# Patient Record
Sex: Male | Born: 1935
Health system: Southern US, Community
[De-identification: ages and names within clinical notes are randomized; demographics above are authoritative.]

## PROBLEM LIST (undated history)

## (undated) DIAGNOSIS — J439 Emphysema, unspecified: Secondary | ICD-10-CM

## (undated) DIAGNOSIS — L409 Psoriasis, unspecified: Secondary | ICD-10-CM

## (undated) DIAGNOSIS — K219 Gastro-esophageal reflux disease without esophagitis: Secondary | ICD-10-CM

## (undated) DIAGNOSIS — I2510 Atherosclerotic heart disease of native coronary artery without angina pectoris: Secondary | ICD-10-CM

## (undated) DIAGNOSIS — I10 Essential (primary) hypertension: Secondary | ICD-10-CM

## (undated) DIAGNOSIS — I739 Peripheral vascular disease, unspecified: Secondary | ICD-10-CM

## (undated) DIAGNOSIS — E039 Hypothyroidism, unspecified: Secondary | ICD-10-CM

## (undated) DIAGNOSIS — G4733 Obstructive sleep apnea (adult) (pediatric): Secondary | ICD-10-CM

## (undated) DIAGNOSIS — I255 Ischemic cardiomyopathy: Secondary | ICD-10-CM

## (undated) DIAGNOSIS — I639 Cerebral infarction, unspecified: Secondary | ICD-10-CM

## (undated) DIAGNOSIS — D509 Iron deficiency anemia, unspecified: Secondary | ICD-10-CM

## (undated) DIAGNOSIS — J342 Deviated nasal septum: Secondary | ICD-10-CM

## (undated) DIAGNOSIS — E119 Type 2 diabetes mellitus without complications: Secondary | ICD-10-CM

## (undated) DIAGNOSIS — N400 Enlarged prostate without lower urinary tract symptoms: Secondary | ICD-10-CM

## (undated) DIAGNOSIS — I4891 Unspecified atrial fibrillation: Secondary | ICD-10-CM

## (undated) DIAGNOSIS — N200 Calculus of kidney: Secondary | ICD-10-CM

## (undated) DIAGNOSIS — E785 Hyperlipidemia, unspecified: Secondary | ICD-10-CM

## (undated) DIAGNOSIS — R0600 Dyspnea, unspecified: Secondary | ICD-10-CM

## (undated) DIAGNOSIS — I480 Paroxysmal atrial fibrillation: Secondary | ICD-10-CM

## (undated) DIAGNOSIS — I420 Dilated cardiomyopathy: Secondary | ICD-10-CM

## (undated) DIAGNOSIS — I5020 Unspecified systolic (congestive) heart failure: Secondary | ICD-10-CM

## (undated) DIAGNOSIS — J189 Pneumonia, unspecified organism: Secondary | ICD-10-CM

## (undated) DIAGNOSIS — I219 Acute myocardial infarction, unspecified: Secondary | ICD-10-CM

## (undated) HISTORY — DX: Emphysema, unspecified: J43.9

## (undated) HISTORY — DX: Unspecified systolic (congestive) heart failure: I50.20

## (undated) HISTORY — PX: CORONARY ARTERY BYPASS GRAFT: SHX141

## (undated) HISTORY — DX: Psoriasis, unspecified: L40.9

## (undated) HISTORY — DX: Iron deficiency anemia, unspecified: D50.9

## (undated) HISTORY — DX: Gastro-esophageal reflux disease without esophagitis: K21.9

## (undated) HISTORY — DX: Benign prostatic hyperplasia without lower urinary tract symptoms: N40.0

## (undated) HISTORY — PX: OTHER SURGICAL HISTORY: SHX169

## (undated) HISTORY — DX: Dilated cardiomyopathy: I25.5

## (undated) HISTORY — DX: Cerebral infarction, unspecified: I63.9

## (undated) HISTORY — DX: Unspecified atrial fibrillation: I48.91

## (undated) HISTORY — DX: Dilated cardiomyopathy: I42.0

## (undated) HISTORY — DX: Paroxysmal atrial fibrillation: I48.0

## (undated) HISTORY — DX: Acute myocardial infarction, unspecified: I21.9

## (undated) HISTORY — PX: CATARACT EXTRACTION: SUR2

## (undated) HISTORY — DX: Obstructive sleep apnea (adult) (pediatric): G47.33

## (undated) HISTORY — DX: Atherosclerotic heart disease of native coronary artery without angina pectoris: I25.10

## (undated) HISTORY — PX: BI-VENTRICULAR IMPLANTABLE CARDIOVERTER DEFIBRILLATOR  (CRT-D): SHX5747

## (undated) HISTORY — PX: CARPAL TUNNEL RELEASE: SHX101

## (undated) HISTORY — DX: Type 2 diabetes mellitus without complications: E11.9

## (undated) HISTORY — DX: Hyperlipidemia, unspecified: E78.5

## (undated) HISTORY — DX: Dyspnea, unspecified: R06.00

## (undated) HISTORY — DX: Calculus of kidney: N20.0

## (undated) HISTORY — DX: Essential (primary) hypertension: I10

## (undated) HISTORY — DX: Pneumonia, unspecified organism: J18.9

## (undated) HISTORY — DX: Hypothyroidism, unspecified: E03.9

## (undated) HISTORY — DX: Peripheral vascular disease, unspecified: I73.9

## (undated) HISTORY — DX: Deviated nasal septum: J34.2

---

## 1998-12-03 ENCOUNTER — Inpatient Hospital Stay (HOSPITAL_COMMUNITY): Admission: EM | Admit: 1998-12-03 | Discharge: 1998-12-10 | Payer: Self-pay | Admitting: Emergency Medicine

## 1998-12-03 ENCOUNTER — Encounter: Payer: Self-pay | Admitting: Emergency Medicine

## 1998-12-03 ENCOUNTER — Encounter: Payer: Self-pay | Admitting: Thoracic Surgery (Cardiothoracic Vascular Surgery)

## 1998-12-04 ENCOUNTER — Encounter: Payer: Self-pay | Admitting: Thoracic Surgery (Cardiothoracic Vascular Surgery)

## 1998-12-05 ENCOUNTER — Encounter: Payer: Self-pay | Admitting: Thoracic Surgery (Cardiothoracic Vascular Surgery)

## 1998-12-09 ENCOUNTER — Encounter: Payer: Self-pay | Admitting: Thoracic Surgery (Cardiothoracic Vascular Surgery)

## 1999-01-20 ENCOUNTER — Encounter (HOSPITAL_COMMUNITY): Admission: RE | Admit: 1999-01-20 | Discharge: 1999-04-20 | Payer: Self-pay | Admitting: *Deleted

## 1999-11-17 ENCOUNTER — Encounter: Admission: RE | Admit: 1999-11-17 | Discharge: 2000-02-15 | Payer: Self-pay | Admitting: *Deleted

## 2002-09-10 ENCOUNTER — Encounter (HOSPITAL_BASED_OUTPATIENT_CLINIC_OR_DEPARTMENT_OTHER): Admission: RE | Admit: 2002-09-10 | Discharge: 2002-12-09 | Payer: Self-pay | Admitting: Internal Medicine

## 2002-12-06 ENCOUNTER — Encounter (HOSPITAL_BASED_OUTPATIENT_CLINIC_OR_DEPARTMENT_OTHER): Admission: RE | Admit: 2002-12-06 | Discharge: 2003-03-06 | Payer: Self-pay | Admitting: Internal Medicine

## 2003-06-13 ENCOUNTER — Encounter (HOSPITAL_BASED_OUTPATIENT_CLINIC_OR_DEPARTMENT_OTHER): Admission: RE | Admit: 2003-06-13 | Discharge: 2003-07-09 | Payer: Self-pay | Admitting: Internal Medicine

## 2003-10-03 ENCOUNTER — Encounter (HOSPITAL_BASED_OUTPATIENT_CLINIC_OR_DEPARTMENT_OTHER): Admission: RE | Admit: 2003-10-03 | Discharge: 2003-10-25 | Payer: Self-pay | Admitting: Internal Medicine

## 2004-01-09 ENCOUNTER — Encounter (HOSPITAL_BASED_OUTPATIENT_CLINIC_OR_DEPARTMENT_OTHER): Admission: RE | Admit: 2004-01-09 | Discharge: 2004-02-06 | Payer: Self-pay | Admitting: Internal Medicine

## 2004-04-30 ENCOUNTER — Encounter (HOSPITAL_BASED_OUTPATIENT_CLINIC_OR_DEPARTMENT_OTHER): Admission: RE | Admit: 2004-04-30 | Discharge: 2004-05-15 | Payer: Self-pay | Admitting: Internal Medicine

## 2004-05-20 ENCOUNTER — Emergency Department (HOSPITAL_COMMUNITY): Admission: EM | Admit: 2004-05-20 | Discharge: 2004-05-20 | Payer: Self-pay | Admitting: Emergency Medicine

## 2004-09-10 ENCOUNTER — Encounter (HOSPITAL_BASED_OUTPATIENT_CLINIC_OR_DEPARTMENT_OTHER): Admission: RE | Admit: 2004-09-10 | Discharge: 2004-09-23 | Payer: Self-pay | Admitting: Internal Medicine

## 2004-12-14 ENCOUNTER — Encounter (HOSPITAL_BASED_OUTPATIENT_CLINIC_OR_DEPARTMENT_OTHER): Admission: RE | Admit: 2004-12-14 | Discharge: 2004-12-30 | Payer: Self-pay | Admitting: Internal Medicine

## 2006-06-13 ENCOUNTER — Ambulatory Visit (HOSPITAL_COMMUNITY): Admission: RE | Admit: 2006-06-13 | Discharge: 2006-06-13 | Payer: Self-pay | Admitting: Cardiology

## 2006-07-13 ENCOUNTER — Ambulatory Visit: Payer: Self-pay | Admitting: Pulmonary Disease

## 2006-08-15 ENCOUNTER — Ambulatory Visit (HOSPITAL_BASED_OUTPATIENT_CLINIC_OR_DEPARTMENT_OTHER): Admission: RE | Admit: 2006-08-15 | Discharge: 2006-08-15 | Payer: Self-pay | Admitting: Pulmonary Disease

## 2006-08-15 ENCOUNTER — Ambulatory Visit: Payer: Self-pay | Admitting: Pulmonary Disease

## 2006-08-31 ENCOUNTER — Ambulatory Visit: Payer: Self-pay | Admitting: Pulmonary Disease

## 2006-11-07 ENCOUNTER — Ambulatory Visit: Payer: Self-pay | Admitting: Pulmonary Disease

## 2007-08-27 ENCOUNTER — Inpatient Hospital Stay (HOSPITAL_COMMUNITY): Admission: EM | Admit: 2007-08-27 | Discharge: 2007-08-30 | Payer: Self-pay | Admitting: Emergency Medicine

## 2007-10-26 DIAGNOSIS — I5020 Unspecified systolic (congestive) heart failure: Secondary | ICD-10-CM

## 2007-10-26 HISTORY — DX: Unspecified systolic (congestive) heart failure: I50.20

## 2008-09-06 ENCOUNTER — Inpatient Hospital Stay (HOSPITAL_BASED_OUTPATIENT_CLINIC_OR_DEPARTMENT_OTHER): Admission: RE | Admit: 2008-09-06 | Discharge: 2008-09-06 | Payer: Self-pay | Admitting: Cardiology

## 2008-09-17 ENCOUNTER — Encounter: Admission: RE | Admit: 2008-09-17 | Discharge: 2008-09-17 | Payer: Self-pay | Admitting: Cardiology

## 2008-09-24 ENCOUNTER — Encounter: Admission: RE | Admit: 2008-09-24 | Discharge: 2008-09-24 | Payer: Self-pay | Admitting: Cardiology

## 2008-10-08 ENCOUNTER — Inpatient Hospital Stay (HOSPITAL_COMMUNITY): Admission: AD | Admit: 2008-10-08 | Discharge: 2008-10-09 | Payer: Self-pay | Admitting: Cardiology

## 2008-10-11 ENCOUNTER — Encounter: Admission: RE | Admit: 2008-10-11 | Discharge: 2008-10-11 | Payer: Self-pay | Admitting: Cardiology

## 2008-11-06 ENCOUNTER — Ambulatory Visit (HOSPITAL_COMMUNITY): Admission: RE | Admit: 2008-11-06 | Discharge: 2008-11-06 | Payer: Self-pay | Admitting: Cardiology

## 2008-11-28 ENCOUNTER — Ambulatory Visit: Payer: Self-pay | Admitting: Pulmonary Disease

## 2008-11-28 ENCOUNTER — Inpatient Hospital Stay (HOSPITAL_COMMUNITY): Admission: EM | Admit: 2008-11-28 | Discharge: 2008-12-02 | Payer: Self-pay | Admitting: *Deleted

## 2010-03-05 ENCOUNTER — Ambulatory Visit (HOSPITAL_COMMUNITY): Admission: RE | Admit: 2010-03-05 | Discharge: 2010-03-05 | Payer: Self-pay | Admitting: Cardiology

## 2010-03-05 ENCOUNTER — Encounter: Payer: Self-pay | Admitting: Pulmonary Disease

## 2010-03-31 DIAGNOSIS — E7800 Pure hypercholesterolemia, unspecified: Secondary | ICD-10-CM | POA: Insufficient documentation

## 2010-03-31 DIAGNOSIS — Z95810 Presence of automatic (implantable) cardiac defibrillator: Secondary | ICD-10-CM | POA: Insufficient documentation

## 2010-03-31 DIAGNOSIS — I10 Essential (primary) hypertension: Secondary | ICD-10-CM | POA: Insufficient documentation

## 2010-03-31 DIAGNOSIS — K219 Gastro-esophageal reflux disease without esophagitis: Secondary | ICD-10-CM | POA: Insufficient documentation

## 2010-04-01 ENCOUNTER — Ambulatory Visit: Payer: Self-pay | Admitting: Pulmonary Disease

## 2010-04-01 DIAGNOSIS — E785 Hyperlipidemia, unspecified: Secondary | ICD-10-CM | POA: Insufficient documentation

## 2010-04-01 DIAGNOSIS — E119 Type 2 diabetes mellitus without complications: Secondary | ICD-10-CM | POA: Insufficient documentation

## 2010-04-01 DIAGNOSIS — I10 Essential (primary) hypertension: Secondary | ICD-10-CM | POA: Insufficient documentation

## 2010-04-01 DIAGNOSIS — J439 Emphysema, unspecified: Secondary | ICD-10-CM

## 2010-04-01 HISTORY — DX: Emphysema, unspecified: J43.9

## 2010-04-03 ENCOUNTER — Ambulatory Visit (HOSPITAL_COMMUNITY): Admission: RE | Admit: 2010-04-03 | Discharge: 2010-04-03 | Payer: Self-pay | Admitting: Cardiology

## 2010-04-16 ENCOUNTER — Encounter: Payer: Self-pay | Admitting: Pulmonary Disease

## 2010-04-22 ENCOUNTER — Inpatient Hospital Stay (HOSPITAL_COMMUNITY): Admission: AD | Admit: 2010-04-22 | Discharge: 2010-04-23 | Payer: Self-pay | Admitting: Cardiology

## 2010-04-29 ENCOUNTER — Ambulatory Visit: Payer: Self-pay | Admitting: Pulmonary Disease

## 2010-08-07 ENCOUNTER — Ambulatory Visit (HOSPITAL_COMMUNITY): Admission: RE | Admit: 2010-08-07 | Discharge: 2010-08-07 | Payer: Self-pay | Admitting: Cardiology

## 2010-09-02 ENCOUNTER — Encounter: Payer: Self-pay | Admitting: Pulmonary Disease

## 2010-09-09 ENCOUNTER — Ambulatory Visit: Payer: Self-pay | Admitting: Pulmonary Disease

## 2010-11-24 NOTE — Letter (Signed)
Summary: [facility]  [facility]   Imported By: Lester Dyess 04/22/2010 09:39:36  _____________________________________________________________________  External Attachment:    Type:   Image     Comment:   External Document

## 2010-11-24 NOTE — Assessment & Plan Note (Signed)
Summary: SOB LAST SEEN IN 2008/CB   Visit Type:  Initial Consult Copy to:  Dr. Armanda Magic Primary Provider/Referring Provider:  Dr. Kirby Funk  CC:  Pt c/o SOB with exertion and at rest. .  History of Present Illness: 77 yo male for evaluation of dyspnea.  I previously met [name] in 2007 for his sleep apnea.  I then saw him in the hospital in 2010 for acute bronchospasm.  He has an extensive history of tobacco abuse, and has been told at different times that he has asthmatic bronchitis and/or COPD.  He has noticed problems with his breathing for some time.  This became acutely worse in February.  He was told that he had pneumonia then.  He had a recurrence in March.  He was treated each time with prednisone and antibiotics with improvement in his symptoms.  He used to do yard work, but has trouble with this now.  He can only walk about 50 feet before getting out of breath.  He has occasional cough which is usually dry, but sometimes he will bring up clear to green sputum.  He denies hemoptysis.  He has noticed wheezing, but feels that much of this may be coming from his throat.  He denies any history of allergies or hay fever.  His denies problems with his swallowing.  His sinuses are okay, and he denies post-nasal drip.  He was started on proair for possible asthma and/or COPD.  He has not found this to be of much benefit.  He does not recall ever using any other inhalers as an outpatient.  He did use a nebulizer when in the hospital, and felt like this helped.  He has an extensive cardiac history with CAD s/p MI, systolic heart failure, and hypertension.  Of note is that he is on ramipril, and carvedilol for his cardiovascular disease.  He has atrial fibrillation, and is on chronic coumadin for this.  He is scheduled to have cardioversion later this month.  He quit smoking in 2000.  He denies occupational exposures.  There is no travel history or animal exposures.  He has been treated  for pneumonia several times in the past few years, but denies any history of TB.  Pulmonary function tests from January 03, 2010: No obstruction, lung volumes were normal, mild diffusion defect.He has been using his CPAP.  He has a full face mask.  He has not noticed any problems with the machine.  His wife does not hear him snore anymore.  He feels like the pressure he is getting is as much as what it was before, and he has noticed more symptoms of daytime sleepiness compared to before.   Preventive Screening-Counseling & Management  Alcohol-Tobacco     Smoking Status: quit     Packs/Day: 3.0     Year Started: 1947     Year Quit: 2000  Current Medications (verified): 1)  Omeprazole 20 Mg Cpdr (Omeprazole) .[name].. 1 Once Daily 2)  Levothroid 50 Mcg Tabs (Levothyroxine Sodium) .[name].. 1 Once Daily 3)  Metformin Hcl 1000 Mg Tabs (Metformin Hcl) .[name].. 1 Two Times A Day With Meals 4)  Lantus Solostar 100 Unit/ml Soln (Insulin Glargine) .... 70 Units At Bedtime 5)  Novolog Flexpen 100 Unit/ml Soln (Insulin Aspart) .... 22 Units Before Breakfast and Dinner and 11 Units Before Lunch 6)  Caltrate 600+d 600-400 Mg-Unit Tabs (Calcium Carbonate-Vitamin D) .[name].. 1 With Meals Two Times A Day 7)  Multivitamins  Tabs (Multiple Vitamin) .Marland KitchenMarland KitchenMarland Kitchen  1 Once Daily 8)  Lipitor 80 Mg Tabs (Atorvastatin Calcium) .[name].. 1 Once Daily 9)  Aspirin 81 Mg Tbec (Aspirin) .[name].. 1 Once Daily 10)  Coumadin 5 Mg Tabs (Warfarin Sodium) .... As Directed Per Clinic 11)  Carvedilol 12.5 Mg Tabs (Carvedilol) .[name].. 1 Two Times A Day 12)  Ramipril 5 Mg Caps (Ramipril) .[name].. 1 Once Daily 13)  Furosemide 40 Mg Tabs (Furosemide) .... Take 2 Tablet By Mouth Two Times A Day 14)  Spironolactone 25 Mg Tabs (Spironolactone) .... Take 1/2 Tablet By Mouth Once A Day 15)  Proair Hfa 108 (90 Base) Mcg/act Aers (Albuterol Sulfate) .... As Needed  Allergies (verified): No Known Drug Allergies  Past History:  Past Medical History: Dyspnea      - PFT  03/05/10 FEV1 2.44(87%), FEV1%74, DLCO 68% Obstructive sleep apnea      - PSG 06/14/06 AHI 35.5      - CPAP 9 cm H2O Coronary artery disease Hypertension Diabetes mellitus, type II Hyperlipidemia Systolic congestive heart failure with EF 40% Atrial fibrillation Hypothyroidism Pneumonia 2008, 2010, 2011 Cleft palate Nasal septal deviation Iron deficiency anemia GERD Benign prostatic hypertrophy Nephrolithiasis Psoriasis Seborrheic keratosis    Past Surgical History: Carpal Tunnel surgery CABG, RCA too small to bypass Feb 2000 Left cleft palate and left cleft lip repair as a child Cataract surgery C-Spine surgery ICD/BI-V insertion 10/08/08  Family History: Reviewed history from 03/31/2010 and no changes required. Asthma- Father Heart dz- Brother CVA- Father  Social History: Former smoker.  Started at age 89, smoke 2 to 3 packs per day, and quit in 2000. Quit ETOH in 2000 Married Retired from [employer] ([employer]). Smoking Status:  quit Packs/Day:  3.0  Review of Systems       The patient complains of shortness of breath with activity, shortness of breath at rest, productive cough, irregular heartbeats, tooth/dental problems, and hand/feet swelling.  The patient denies non-productive cough, coughing up blood, chest pain, acid heartburn, indigestion, loss of appetite, weight change, abdominal pain, difficulty swallowing, sore throat, headaches, nasal congestion/difficulty breathing through nose, sneezing, itching, ear ache, anxiety, depression, joint stiffness or pain, rash, change in color of mucus, and fever.    Vital Signs:  Patient profile:   77 year old male Height:      69 inches Weight:      232 pounds BMI:     34.38 O2 Sat:      95 % on Room air Temp:     97.7 degrees F oral Pulse rate:   80 / minute BP sitting:   82 / 60  (right arm) Cuff size:   regular  Vitals Entered By: Zackery Barefoot CMA (April 01, 2010 3:18 PM)  O2 Flow:  Room air CC: Pt c/o SOB  with exertion and at rest.  Comments Medications reviewed with patient Verified contact number and pharmacy with patient Zackery Barefoot CMA  April 01, 2010 3:18 PM    Physical Exam  General:  obese.   Eyes:  PERRLA and EOMI Nose:  clear drainage, no tenderness Mouth:  s/p cleft lip/palate repair, no exudate Neck:  no JVD.   Chest Wall:  no deformities noted Lungs:  diminished breath sounds, no wheezing or rales Heart:  regular rhythm, normal rate, and no murmurs.   Abdomen:  obese, soft, nontender, normal bowel sounds Msk:  normal gait.   Pulses:  pulses normal Extremities:  minimal ankle edema Neurologic:  normal CN II-XII and strength normal.   Cervical  Nodes:  no significant adenopathy Psych:  alert and cooperative; normal mood and affect; normal attention span and concentration   Impression & Recommendations:  Problem # 1:  DYSPNEA (ICD-786.05) This is likely multifactorial.    He has coronary artery disease, hypertension, systolic dysfunction, and atrial fibrillation.  He is scheduled to undergo cardioversion soon for his atrial fibrillation.  Hopefully restoring normal sinus rhythm will improve his cardiac function and alleviate some of his dyspnea.  These issues are being addressed by cardiology.  Of note is that he is on an ACE inhibitor, and this could be contributing to some of his upper airway symptoms.  Will defer to cardiology whether he can have this changed to an alternative agent.  He has an extensive history of prior tobacco abuse, but his recent pulmonary function test did not show evidence for airflow obstruction.  He has been told previously that he has either asthmatic bronchitis or COPD.  He has previously been on nebulizer therapy when he has acute breathing problems, and felt like this helped better that inhaler therapy.  I will set him up with an albuterol nebulizer.  Depending on his response to nebulizer therapy will decide if he needs to have further  adjustements to his bronchodilator regimen.  Of note is that he is on carvedilol.  It is possible that this could be contributing to some degree of bronchospasm.  It may be warranted to change to a more cardioselective beta blocking agent.  Will defer this decision to cardiology.  He has been on chronic coumadin for his atrial fibrillation, and there is nothing in his history to suggest chronic thrombo-embolic disease.  I will defer further evaluation for this at present.  He leads a relatively sedintary lifestyle, and he likely has a component of deconditioning.  Depending on the outcome of his evaluation, he may need to consider starting a graded exercise regimen.  If the cause of his dyspnea still can not be determined, then consideration may need to be given to him undergoing a cardio-pulmonary stress test (CPST).  Problem # 2:  OBSTRUCTIVE SLEEP APNEA (ICD-327.23) He has a history of severe sleep apnea.  He has been on CPAP 9 cm H2O.  I will get a copy of his CPAP download to determine if any adjustments are needed for his CPAP set up.  Problem # 3:  ABNORMAL CHEST XRAY (ICD-793.1) He had pneumonia in April 2011.  He has not had f/u chest xray since.  Will repeat his chest xray today to document clearing of his infiltrate, and to further assess whether he has any other lung parenchymal disorder that could be contributing to his dyspnea.  Medications Added to Medication List This Visit: 1)  Spironolactone 25 Mg Tabs (Spironolactone) .... Take 1/2 tablet by mouth once a day 2)  Furosemide 40 Mg Tabs (Furosemide) .... Take 2 tablet by mouth two times a day 3)  Proair Hfa 108 (90 Base) Mcg/act Aers (Albuterol sulfate) .... As needed 4)  Albuterol Sulfate (2.5 Mg/56ml) 0.083% Nebu (Albuterol sulfate) .... One vial nebulized four times per day  Complete Medication List: 1)  Carvedilol 12.5 Mg Tabs (Carvedilol) .[name].. 1 two times a day 2)  Ramipril 5 Mg Caps (Ramipril) .[name].. 1 once daily 3)   Spironolactone 25 Mg Tabs (Spironolactone) .... Take 1/2 tablet by mouth once a day 4)  Furosemide 40 Mg Tabs (Furosemide) .... Take 2 tablet by mouth two times a day 5)  Aspirin 81 Mg Tbec (Aspirin) .Marland KitchenMarland KitchenMarland Kitchen  1 once daily 6)  Coumadin 5 Mg Tabs (Warfarin sodium) .... As directed per clinic 7)  Lipitor 80 Mg Tabs (Atorvastatin calcium) .[name].. 1 once daily 8)  Omeprazole 20 Mg Cpdr (Omeprazole) .[name].. 1 once daily 9)  Levothroid 50 Mcg Tabs (Levothyroxine sodium) .[name].. 1 once daily 10)  Metformin Hcl 1000 Mg Tabs (Metformin hcl) .[name].. 1 two times a day with meals 11)  Lantus Solostar 100 Unit/ml Soln (Insulin glargine) .... 70 units at bedtime 12)  Novolog Flexpen 100 Unit/ml Soln (Insulin aspart) .... 22 units before breakfast and dinner and 11 units before lunch 13)  Caltrate 600+d 600-400 Mg-unit Tabs (Calcium carbonate-vitamin d) .Marland KitchenMarland Kitchen. 1 with meals two times a day 14)  Multivitamins Tabs (Multiple vitamin) .[name].. 1 once daily 15)  Proair Hfa 108 (90 Base) Mcg/act Aers (Albuterol sulfate) .... As needed 16)  Albuterol Sulfate (2.5 Mg/58ml) 0.083% Nebu (Albuterol sulfate) .... One vial nebulized four times per day  Other Orders: Consultation Level IV (16109) Prescription Created Electronically [phone]) DME Referral (DME) DME Referral (DME) T-2 View CXR ([id number])  Patient Instructions: 1)  Chest xray today 2)  Will get report from CPAP machine 3)  Will arrange for home nebulizer 4)  Use albuterol one vial nebulized up to four times per day as needed for cough, wheeze, shortness of breath or chest congestion  5)  Use proair two puffs as needed for for cough, wheeze, shortness of breath or chest congestion  6)  Follow up in 4 weeks Prescriptions: ALBUTEROL SULFATE (2.5 MG/3ML) 0.083% NEBU (ALBUTEROL SULFATE) one vial nebulized four times per day  #120 x 3   Entered and Authorized by:   Coralyn Helling MD   Signed by:   Coralyn Helling MD on 04/01/2010   Method used:   Electronically to        [facility] [id number] (retail)       [address]       [place], [place]  [address]       Ph: [phone] or [phone]       Fax: [phone]   RxID:   [id number]    Immunization History:  Influenza Immunization History:    Influenza:  historical (07/28/2009)  Pneumovax Immunization History:    Pneumovax:  historical (07/28/2009)

## 2010-11-24 NOTE — Assessment & Plan Note (Signed)
Summary: 4 weeks/cb   Visit Type:  Follow-up Copy to:  Dr. Armanda Magic Primary Provider/Referring Provider:  Dr. Kirby Funk  CC:  OSA.  The patient says he is wearing his cpap every night but can't tell that it has changed or improved his sleep.[name]  History of Present Illness: 77 yo male with dyspnea, cough, emphysema, and OSA on CPAP 9 cm.  He had attempted cardioversion about 2 weeks ago, but this apparently was unsuccessful.    He has not noticed any change with his breathing.  He still has a cough which is usually dry, but he occasional brings up sputum.  He denies fever or hemoptysis.  He will get wheeze at times, but this may be coming more from his throat rather than his chest.  He has been using his albuterol four times per day.  He thought that is how he was supposed to use it.  He does not notice much difference with albuterol.    He has been using his CPAP.  He does not have any problems with the mask.  He will wake up to use the bathroom, but sometimes forget to put his CPAP back on.   CXR  Procedure date:  04/01/2010  Findings:      CHEST - 2 VIEW   Comparison: 11/28/2008.   Findings: The cardiopericardial silhouette is enlarged. The lungs are clear without focal infiltrate, edema, pneumothorax or pleural effusion. Hyperexpansion is consistent with emphysema. The patient is status post CABG.  Left-sided pacer / AICD remains in place. The bibasilar airspace disease visible on the previous exam has resolved in the interval.   IMPRESSION: Emphysema with cardiomegaly.   No acute findings today with interval resolution of the basilar airspace disease seen previously.   Current Medications (verified): 1)  Carvedilol 12.5 Mg Tabs (Carvedilol) .[name].. 1 Two Times A Day 2)  Ramipril 5 Mg Caps (Ramipril) .[name].. 1 Once Daily 3)  Spironolactone 25 Mg Tabs (Spironolactone) .... Take 1/2 Tablet By Mouth Once A Day 4)  Furosemide 40 Mg Tabs (Furosemide) .... Take 2 Tablet By  Mouth Two Times A Day 5)  Aspirin 81 Mg Tbec (Aspirin) .[name].. 1 Once Daily 6)  Coumadin 5 Mg Tabs (Warfarin Sodium) .... As Directed Per Clinic 7)  Lipitor 80 Mg Tabs (Atorvastatin Calcium) .[name].. 1 Once Daily 8)  Omeprazole 20 Mg Cpdr (Omeprazole) .[name].. 1 Once Daily 9)  Levothroid 50 Mcg Tabs (Levothyroxine Sodium) .[name].. 1 Once Daily 10)  Metformin Hcl 1000 Mg Tabs (Metformin Hcl) .[name].. 1 Two Times A Day With Meals 11)  Lantus Solostar 100 Unit/ml Soln (Insulin Glargine) .... 65 Units At Bedtime 12)  Novolog Flexpen 100 Unit/ml Soln (Insulin Aspart) .... 20 Units Before Breakfast and Dinner and 10 Units Before Lunch 13)  Caltrate 600+d 600-400 Mg-Unit Tabs (Calcium Carbonate-Vitamin D) .[name].. 1 With Meals Two Times A Day 14)  Multivitamins  Tabs (Multiple Vitamin) .[name].. 1 Once Daily 15)  Proair Hfa 108 (90 Base) Mcg/act Aers (Albuterol Sulfate) .... As Needed 16)  Albuterol Sulfate (2.5 Mg/6ml) 0.083% Nebu (Albuterol Sulfate) .... One Vial Nebulized Four Times Per Day  Allergies (verified): No Known Drug Allergies  Past History:  Past Medical History: Reviewed history from 04/01/2010 and no changes required. Dyspnea      - PFT 03/05/10 FEV1 2.44(87%), FEV1%74, DLCO 68% Obstructive sleep apnea      - PSG 06/14/06 AHI 35.5      - CPAP 9 cm H2O Coronary artery disease Hypertension Diabetes  mellitus, type II Hyperlipidemia Systolic congestive heart failure with EF 40% Atrial fibrillation Hypothyroidism Pneumonia 2008, 2010, 2011 Cleft palate Nasal septal deviation Iron deficiency anemia GERD Benign prostatic hypertrophy Nephrolithiasis Psoriasis Seborrheic keratosis    Past Surgical History: Reviewed history from 04/01/2010 and no changes required. Carpal Tunnel surgery CABG, RCA too small to bypass Feb 2000 Left cleft palate and left cleft lip repair as a child Cataract surgery C-Spine surgery ICD/BI-V insertion 10/08/08  Vital Signs:  Patient profile:   77 year old  male Height:      69 inches (175.26 cm) Weight:      237 pounds (107.73 kg) BMI:     35.13 O2 Sat:      98 % on Room air Temp:     97.8 degrees F (36.56 degrees C) oral Pulse rate:   102 / minute BP sitting:   120 / 68  (left arm) Cuff size:   regular  Vitals Entered By: Michel Bickers CMA (April 29, 2010 3:39 PM)  O2 Sat at Rest %:  98 O2 Flow:  Room air CC: OSA.  The patient says he is wearing his cpap every night but can't tell that it has changed or improved his sleep. Comments Medications reviewed and phone number verified.Michel Bickers [facility]  April 29, 2010 3:40 PM   Physical Exam  General:  obese.   Nose:  clear drainage, no tenderness, deviated septum.   Mouth:  s/p cleft lip/palate repair, no exudate Neck:  no JVD.   Lungs:  diminished breath sounds, no wheezing or rales Heart:  regular rhythm, normal rate, and no murmurs.   Extremities:  minimal ankle edema Cervical Nodes:  no significant adenopathy   Impression & Recommendations:  Problem # 1:  DYSPNEA (ICD-786.05)  This is likely multifactorial.    He has coronary artery disease, hypertension, systolic dysfunction, and atrial fibrillation.  He had unsuccessful attempt at cardioversion earlier this month.  i  Of note is that he is on an ACE inhibitor, and this could be contributing to some of his upper airway symptoms.  Will defer to cardiology whether he can have this changed to an alternative agent.  He has an extensive history of prior tobacco abuse, but his recent pulmonary function test did not show evidence for airflow obstruction.  He only has partial response to nebulizer therapy.  Of note is that he is on carvedilol.  It is possible that this could be contributing to some degree of bronchospasm.  It may be warranted to change to a more cardioselective beta blocking agent.  Will defer this decision to cardiology.  He leads a relatively sedintary lifestyle, and he likely has a component of deconditioning.  Depending  on the outcome of his evaluation, he may need to consider starting a graded exercise regimen.  Problem # 2:  OBSTRUCTIVE SLEEP APNEA (ICD-327.23)  Encouraged him to use his CPAP for the whole night.  Problem # 3:  COUGH (ICD-786.2)  Have advised him to d/w cardiology about whether ACE inhibitor use could be contributing to his cough.  Problem # 4:  EMPHYSEMA (ICD-492.8)  He is to continue as needed albuterol.  I advised him that he does not need to use his nebulizer if his breathing is okay.  Problem # 5:  FIBRILLATION, ATRIAL (ICD-427.31)  He had failed attempt at cardioversion.  Problem # 6:  HYPERTENSION (ICD-401.9) He is to follow up with cardiology.  Medications Added to Medication List This Visit: 1)  Lantus Solostar 100 Unit/ml Soln (Insulin glargine) .... 65 units at bedtime 2)  Novolog Flexpen 100 Unit/ml Soln (Insulin aspart) .... 20 units before breakfast and dinner and 10 units before lunch  Complete Medication List: 1)  Carvedilol 12.5 Mg Tabs (Carvedilol) .[name].. 1 two times a day 2)  Ramipril 5 Mg Caps (Ramipril) .[name].. 1 once daily 3)  Spironolactone 25 Mg Tabs (Spironolactone) .... Take 1/2 tablet by mouth once a day 4)  Furosemide 40 Mg Tabs (Furosemide) .... Take 2 tablet by mouth two times a day 5)  Aspirin 81 Mg Tbec (Aspirin) .[name].. 1 once daily 6)  Coumadin 5 Mg Tabs (Warfarin sodium) .... As directed per clinic 7)  Lipitor 80 Mg Tabs (Atorvastatin calcium) .[name].. 1 once daily 8)  Omeprazole 20 Mg Cpdr (Omeprazole) .[name].. 1 once daily 9)  Levothroid 50 Mcg Tabs (Levothyroxine sodium) .[name].. 1 once daily 10)  Metformin Hcl 1000 Mg Tabs (Metformin hcl) .[name].. 1 two times a day with meals 11)  Lantus Solostar 100 Unit/ml Soln (Insulin glargine) .... 65 units at bedtime 12)  Novolog Flexpen 100 Unit/ml Soln (Insulin aspart) .... 20 units before breakfast and dinner and 10 units before lunch 13)  Caltrate 600+d 600-400 Mg-unit Tabs (Calcium carbonate-vitamin d) .Marland KitchenMarland Kitchen. 1 with  meals two times a day 14)  Multivitamins Tabs (Multiple vitamin) .[name].. 1 once daily 15)  Proair Hfa 108 (90 Base) Mcg/act Aers (Albuterol sulfate) .... As needed 16)  Albuterol Sulfate (2.5 Mg/60ml) 0.083% Nebu (Albuterol sulfate) .... One vial nebulized four times per day  Other Orders: Est. Patient Level IV (84696)  Patient Instructions: 1)  Talk to Dr. Mayford Knife about whether ramipril and carvedilol can be changed to alternative medications to see if this improves your cough 2)  Use albuterol nebulizer up to four times per day as needed.  If your breathing is okay, then you don't need to use your albuterol 3)  Follow up in 4 to 6 months

## 2010-11-24 NOTE — Letter (Signed)
Summary: [facility]  [facility] Supp[facility]   Imported By: Sherian Rein 09/07/2010 11:50:05  _____________________________________________________________________  External Attachment:    Type:   Image     Comment:   External Document

## 2010-11-24 NOTE — Assessment & Plan Note (Signed)
Summary: 4 months/ mbw   Copy to:  Dr. Armanda Magic Primary Suzannah Bettes/Referring Mannie Ohlin:  Dr. Kirby Funk  CC:  OSA follow up.  History of Present Illness: 77 yo male with Emphysema, and OSA on CPAP 9 cm.  He was having trouble with his heart rhythm and heart failure.  This has since improved, and his breathing has improved.  He gets occasional cough, and uses his nebulizer in the morning.  He is not having any problem with his CPAP until recently.  He says that a light is blinking, and it is not giving as much pressure.  He is taking the machine to his DME tomorrow.  He thinks his machine is more than 75 years old.  Preventive Screening-Counseling & Management  Alcohol-Tobacco     Smoking Status: quit     Packs/Day: 3.0     Year Started: 1949     Year Quit: 2000  Current Medications (verified): 1)  Carvedilol 12.5 Mg Tabs (Carvedilol) .[name].. 1 Two Times A Day 2)  Spironolactone 25 Mg Tabs (Spironolactone) .... Take 1/2 Tablet By Mouth Once A Day 3)  Furosemide 40 Mg Tabs (Furosemide) .... Take 1 Tablet By Mouth Two Times A Day 4)  Aspirin 81 Mg Tbec (Aspirin) .[name].. 1 Once Daily 5)  Coumadin 5 Mg Tabs (Warfarin Sodium) .... As Directed Per Clinic 6)  Pravastatin Sodium 80 Mg Tabs (Pravastatin Sodium) .... Take 1 Tablet By Mouth Once A Day 7)  Omeprazole 20 Mg Cpdr (Omeprazole) .[name].. 1 Once Daily 8)  Levothroid 75 Mcg Tabs (Levothyroxine Sodium) .... Take 1 Tablet By Mouth Once A Day 9)  Lantus Solostar 100 Unit/ml Soln (Insulin Glargine) .... 55 Units At Bedtime 10)  Novolog Flexpen 100 Unit/ml Soln (Insulin Aspart) .... 20 Units Before Breakfast and Dinner and 10 Units Before Lunch 11)  Caltrate 600+d 600-400 Mg-Unit Tabs (Calcium Carbonate-Vitamin D) .[name].. 1 With Meals Two Times A Day 12)  Multivitamins  Tabs (Multiple Vitamin) .[name].. 1 Once Daily 13)  Proair Hfa 108 (90 Base) Mcg/act Aers (Albuterol Sulfate) .... As Needed 14)  Albuterol Sulfate (2.5 Mg/6ml) 0.083% Nebu (Albuterol  Sulfate) .... One Vial Nebulized Four Times Per Day 15)  Diovan 80 Mg Tabs (Valsartan) .... Take 1 Tablet By Mouth Once A Day  Allergies (verified): No Known Drug Allergies  Past History:  Past Medical History: Reviewed history from 04/01/2010 and no changes required. Dyspnea      - PFT 03/05/10 FEV1 2.44(87%), FEV1%74, DLCO 68% Obstructive sleep apnea      - PSG 06/14/06 AHI 35.5      - CPAP 9 cm H2O Coronary artery disease Hypertension Diabetes mellitus, type II Hyperlipidemia Systolic congestive heart failure with EF 40% Atrial fibrillation Hypothyroidism Pneumonia 2008, 2010, 2011 Cleft palate Nasal septal deviation Iron deficiency anemia GERD Benign prostatic hypertrophy Nephrolithiasis Psoriasis Seborrheic keratosis    Past Surgical History: Reviewed history from 04/01/2010 and no changes required. Carpal Tunnel surgery CABG, RCA too small to bypass Feb 2000 Left cleft palate and left cleft lip repair as a child Cataract surgery C-Spine surgery ICD/BI-V insertion 10/08/08  Vital Signs:  Patient profile:   77 year old male Height:      69 inches Weight:      235.4 pounds BMI:     34.89 O2 Sat:      96 % on Room air Temp:     97.3 degrees F oral Pulse rate:   73 / minute BP sitting:   134 /  72  (right arm) Cuff size:   regular  Vitals Entered By: Zackery Barefoot CMA (September 09, 2010 2:42 PM)  O2 Flow:  Room air CC: OSA follow up Comments Medications reviewed with patient Verified contact number and pharmacy with patient Zackery Barefoot CMA  September 09, 2010 2:43 PM     Impression & Recommendations:  Problem # 1:  OBSTRUCTIVE SLEEP APNEA (ICD-327.23)  He will check with his DME about whether he needs a new machine.  Discussed how to prevent irritation on his nasal bridge.   Problem # 2:  EMPHYSEMA (ICD-492.8)  He is to continue as needed albuterol per primary care.  Medications Added to Medication List This Visit: 1)  Furosemide 40 Mg  Tabs (Furosemide) .... Take 1 tablet by mouth two times a day 2)  Pravastatin Sodium 80 Mg Tabs (Pravastatin sodium) .... Take 1 tablet by mouth once a day 3)  Levothroid 75 Mcg Tabs (Levothyroxine sodium) .... Take 1 tablet by mouth once a day 4)  Lantus Solostar 100 Unit/ml Soln (Insulin glargine) .... 55 units at bedtime 5)  Diovan 80 Mg Tabs (Valsartan) .... Take 1 tablet by mouth once a day  Complete Medication List: 1)  Carvedilol 12.5 Mg Tabs (Carvedilol) .[name].. 1 two times a day 2)  Spironolactone 25 Mg Tabs (Spironolactone) .... Take 1/2 tablet by mouth once a day 3)  Furosemide 40 Mg Tabs (Furosemide) .... Take 1 tablet by mouth two times a day 4)  Aspirin 81 Mg Tbec (Aspirin) .[name].. 1 once daily 5)  Coumadin 5 Mg Tabs (Warfarin sodium) .... As directed per clinic 6)  Pravastatin Sodium 80 Mg Tabs (Pravastatin sodium) .... Take 1 tablet by mouth once a day 7)  Omeprazole 20 Mg Cpdr (Omeprazole) .[name].. 1 once daily 8)  Levothroid 75 Mcg Tabs (Levothyroxine sodium) .... Take 1 tablet by mouth once a day 9)  Lantus Solostar 100 Unit/ml Soln (Insulin glargine) .... 55 units at bedtime 10)  Novolog Flexpen 100 Unit/ml Soln (Insulin aspart) .... 20 units before breakfast and dinner and 10 units before lunch 11)  Caltrate 600+d 600-400 Mg-unit Tabs (Calcium carbonate-vitamin d) .Marland KitchenMarland Kitchen. 1 with meals two times a day 12)  Multivitamins Tabs (Multiple vitamin) .[name].. 1 once daily 13)  Proair Hfa 108 (90 Base) Mcg/act Aers (Albuterol sulfate) .... As needed 14)  Albuterol Sulfate (2.5 Mg/27ml) 0.083% Nebu (Albuterol sulfate) .... One vial nebulized four times per day 15)  Diovan 80 Mg Tabs (Valsartan) .... Take 1 tablet by mouth once a day  Other Orders: Est. Patient Level III ([address])  Patient Instructions: 1)  Follow up in one year    Immunization History:  Influenza Immunization History:    Influenza:  historical (08/25/2010)

## 2010-12-22 LAB — PACEMAKER DEVICE OBSERVATION

## 2011-01-07 LAB — CBC
HCT: 31.9 % — ABNORMAL LOW (ref 39.0–52.0)
Hemoglobin: 9.9 g/dL — ABNORMAL LOW (ref 13.0–17.0)
MCH: 24.5 pg — ABNORMAL LOW (ref 26.0–34.0)
MCHC: 31 g/dL (ref 30.0–36.0)
MCV: 79 fL (ref 78.0–100.0)
Platelets: 249 10*3/uL (ref 150–400)
RBC: 4.04 MIL/uL — ABNORMAL LOW (ref 4.22–5.81)
RDW: 17.3 % — ABNORMAL HIGH (ref 11.5–15.5)
WBC: 8.8 10*3/uL (ref 4.0–10.5)

## 2011-01-07 LAB — BASIC METABOLIC PANEL
BUN: 30 mg/dL — ABNORMAL HIGH (ref 6–23)
CO2: 28 mEq/L (ref 19–32)
Calcium: 9.6 mg/dL (ref 8.4–10.5)
Chloride: 105 mEq/L (ref 96–112)
Creatinine, Ser: 1.57 mg/dL — ABNORMAL HIGH (ref 0.4–1.5)
GFR calc Af Amer: 53 mL/min — ABNORMAL LOW (ref >60)
GFR calc non Af Amer: 43 mL/min — ABNORMAL LOW (ref >60)
Glucose, Bld: 78 mg/dL (ref 70–99)
Potassium: 4.3 mEq/L (ref 3.5–5.1)
Sodium: 142 mEq/L (ref 135–145)

## 2011-01-07 LAB — GLUCOSE, CAPILLARY: Glucose-Capillary: 76 mg/dL (ref 70–99)

## 2011-01-07 LAB — PROTIME-INR
INR: 2.98 — ABNORMAL HIGH (ref 0.00–1.49)
Prothrombin Time: 31 seconds — ABNORMAL HIGH (ref 11.6–15.2)

## 2011-01-07 LAB — MAGNESIUM: Magnesium: 2 mg/dL (ref 1.5–2.5)

## 2011-01-07 LAB — APTT: aPTT: 36 seconds (ref 24–37)

## 2011-01-08 ENCOUNTER — Encounter (INDEPENDENT_AMBULATORY_CARE_PROVIDER_SITE_OTHER): Payer: Self-pay | Admitting: *Deleted

## 2011-01-10 LAB — COMPREHENSIVE METABOLIC PANEL
ALT: 20 U/L (ref 0–53)
AST: 33 U/L (ref 0–37)
Albumin: 3.8 g/dL (ref 3.5–5.2)
Alkaline Phosphatase: 55 U/L (ref 39–117)
BUN: 28 mg/dL — ABNORMAL HIGH (ref 6–23)
CO2: 30 mEq/L (ref 19–32)
Calcium: 9.1 mg/dL (ref 8.4–10.5)
Chloride: 103 mEq/L (ref 96–112)
Creatinine, Ser: 1.53 mg/dL — ABNORMAL HIGH (ref 0.4–1.5)
GFR calc Af Amer: 54 mL/min — ABNORMAL LOW (ref >60)
GFR calc non Af Amer: 45 mL/min — ABNORMAL LOW (ref >60)
Glucose, Bld: 50 mg/dL — ABNORMAL LOW (ref 70–99)
Potassium: 4.1 mEq/L (ref 3.5–5.1)
Sodium: 142 mEq/L (ref 135–145)
Total Bilirubin: 0.7 mg/dL (ref 0.3–1.2)
Total Protein: 7 g/dL (ref 6.0–8.3)

## 2011-01-10 LAB — DIFFERENTIAL
Basophils Absolute: 0 10*3/uL (ref 0.0–0.1)
Basophils Relative: 0 % (ref 0–1)
Eosinophils Absolute: 0.3 10*3/uL (ref 0.0–0.7)
Eosinophils Relative: 3 % (ref 0–5)
Lymphocytes Relative: 24 % (ref 12–46)
Lymphs Abs: 2.3 10*3/uL (ref 0.7–4.0)
Monocytes Absolute: 0.6 10*3/uL (ref 0.1–1.0)
Monocytes Relative: 7 % (ref 3–12)
Neutro Abs: 6.3 10*3/uL (ref 1.7–7.7)
Neutrophils Relative %: 66 % (ref 43–77)

## 2011-01-10 LAB — GLUCOSE, CAPILLARY
Glucose-Capillary: 108 mg/dL — ABNORMAL HIGH (ref 70–99)
Glucose-Capillary: 112 mg/dL — ABNORMAL HIGH (ref 70–99)
Glucose-Capillary: 114 mg/dL — ABNORMAL HIGH (ref 70–99)
Glucose-Capillary: 153 mg/dL — ABNORMAL HIGH (ref 70–99)
Glucose-Capillary: 213 mg/dL — ABNORMAL HIGH (ref 70–99)
Glucose-Capillary: 270 mg/dL — ABNORMAL HIGH (ref 70–99)
Glucose-Capillary: 53 mg/dL — ABNORMAL LOW (ref 70–99)
Glucose-Capillary: 66 mg/dL — ABNORMAL LOW (ref 70–99)

## 2011-01-10 LAB — CBC
HCT: 33.4 % — ABNORMAL LOW (ref 39.0–52.0)
Hemoglobin: 11.1 g/dL — ABNORMAL LOW (ref 13.0–17.0)
MCH: 26.5 pg (ref 26.0–34.0)
MCHC: 33.1 g/dL (ref 30.0–36.0)
MCV: 80.1 fL (ref 78.0–100.0)
Platelets: 213 10*3/uL (ref 150–400)
RBC: 4.17 MIL/uL — ABNORMAL LOW (ref 4.22–5.81)
RDW: 17.1 % — ABNORMAL HIGH (ref 11.5–15.5)
WBC: 9.5 10*3/uL (ref 4.0–10.5)

## 2011-01-10 LAB — PROTIME-INR
INR: 1.4 (ref 0.00–1.49)
INR: 1.57 — ABNORMAL HIGH (ref 0.00–1.49)
Prothrombin Time: 17 seconds — ABNORMAL HIGH (ref 11.6–15.2)
Prothrombin Time: 18.6 seconds — ABNORMAL HIGH (ref 11.6–15.2)

## 2011-01-10 LAB — TSH: TSH: 3.391 u[IU]/mL (ref 0.350–4.500)

## 2011-01-11 LAB — GLUCOSE, CAPILLARY: Glucose-Capillary: 153 mg/dL — ABNORMAL HIGH (ref 70–99)

## 2011-01-12 NOTE — Letter (Signed)
Summary: Appointment - Reminder 2  [facility], Main Office  [address]   [place], [place] [address]   Phone: [phone]  Fax: [phone]     January 08, 2011 MRN: [id number]   [facility] [address] [place], [place]  [address]   Dear [name],  Our records indicate that it is time to schedule a follow-up appointment.  Dr.Taylor recommended that you follow up with [place] in May. It is very important that we reach you to schedule this appointment. We look forward to participating in your health care needs. Please contact us at the number listed above at your earliest convenience to schedule your appointment.  If you are unable to make an appointment at this time, give [place] a call so we can update our records.     Sincerely,   Glass blower/designer

## 2011-02-09 LAB — BRAIN NATRIURETIC PEPTIDE
Pro B Natriuretic peptide (BNP): 385 pg/mL — ABNORMAL HIGH (ref 0.0–100.0)
Pro B Natriuretic peptide (BNP): 510 pg/mL — ABNORMAL HIGH (ref 0.0–100.0)
Pro B Natriuretic peptide (BNP): 719 pg/mL — ABNORMAL HIGH (ref 0.0–100.0)
Pro B Natriuretic peptide (BNP): 735 pg/mL — ABNORMAL HIGH (ref 0.0–100.0)
Pro B Natriuretic peptide (BNP): 739 pg/mL — ABNORMAL HIGH (ref 0.0–100.0)

## 2011-02-09 LAB — GLUCOSE, CAPILLARY
Glucose-Capillary: 213 mg/dL — ABNORMAL HIGH (ref 70–99)
Glucose-Capillary: 239 mg/dL — ABNORMAL HIGH (ref 70–99)
Glucose-Capillary: 256 mg/dL — ABNORMAL HIGH (ref 70–99)
Glucose-Capillary: 280 mg/dL — ABNORMAL HIGH (ref 70–99)
Glucose-Capillary: 281 mg/dL — ABNORMAL HIGH (ref 70–99)
Glucose-Capillary: 284 mg/dL — ABNORMAL HIGH (ref 70–99)
Glucose-Capillary: 295 mg/dL — ABNORMAL HIGH (ref 70–99)
Glucose-Capillary: 308 mg/dL — ABNORMAL HIGH (ref 70–99)
Glucose-Capillary: 310 mg/dL — ABNORMAL HIGH (ref 70–99)
Glucose-Capillary: 317 mg/dL — ABNORMAL HIGH (ref 70–99)
Glucose-Capillary: 344 mg/dL — ABNORMAL HIGH (ref 70–99)
Glucose-Capillary: 358 mg/dL — ABNORMAL HIGH (ref 70–99)
Glucose-Capillary: 374 mg/dL — ABNORMAL HIGH (ref 70–99)
Glucose-Capillary: 402 mg/dL — ABNORMAL HIGH (ref 70–99)
Glucose-Capillary: 402 mg/dL — ABNORMAL HIGH (ref 70–99)
Glucose-Capillary: 409 mg/dL — ABNORMAL HIGH (ref 70–99)
Glucose-Capillary: 463 mg/dL — ABNORMAL HIGH (ref 70–99)

## 2011-02-09 LAB — CBC
HCT: 30.7 % — ABNORMAL LOW (ref 39.0–52.0)
HCT: 31.3 % — ABNORMAL LOW (ref 39.0–52.0)
HCT: 32.3 % — ABNORMAL LOW (ref 39.0–52.0)
HCT: 37 % — ABNORMAL LOW (ref 39.0–52.0)
Hemoglobin: 10.4 g/dL — ABNORMAL LOW (ref 13.0–17.0)
Hemoglobin: 10.5 g/dL — ABNORMAL LOW (ref 13.0–17.0)
Hemoglobin: 10.9 g/dL — ABNORMAL LOW (ref 13.0–17.0)
Hemoglobin: 12.4 g/dL — ABNORMAL LOW (ref 13.0–17.0)
MCHC: 33.5 g/dL (ref 30.0–36.0)
MCHC: 33.5 g/dL (ref 30.0–36.0)
MCHC: 33.7 g/dL (ref 30.0–36.0)
MCHC: 33.8 g/dL (ref 30.0–36.0)
MCV: 81.4 fL (ref 78.0–100.0)
MCV: 82.1 fL (ref 78.0–100.0)
MCV: 82.2 fL (ref 78.0–100.0)
MCV: 82.2 fL (ref 78.0–100.0)
Platelets: 159 10*3/uL (ref 150–400)
Platelets: 186 10*3/uL (ref 150–400)
Platelets: 189 10*3/uL (ref 150–400)
Platelets: 252 10*3/uL (ref 150–400)
RBC: 3.74 MIL/uL — ABNORMAL LOW (ref 4.22–5.81)
RBC: 3.81 MIL/uL — ABNORMAL LOW (ref 4.22–5.81)
RBC: 3.97 MIL/uL — ABNORMAL LOW (ref 4.22–5.81)
RBC: 4.5 MIL/uL (ref 4.22–5.81)
RDW: 17.3 % — ABNORMAL HIGH (ref 11.5–15.5)
RDW: 17.4 % — ABNORMAL HIGH (ref 11.5–15.5)
RDW: 17.5 % — ABNORMAL HIGH (ref 11.5–15.5)
RDW: 17.9 % — ABNORMAL HIGH (ref 11.5–15.5)
WBC: 13.5 10*3/uL — ABNORMAL HIGH (ref 4.0–10.5)
WBC: 14.6 10*3/uL — ABNORMAL HIGH (ref 4.0–10.5)
WBC: 15.4 10*3/uL — ABNORMAL HIGH (ref 4.0–10.5)
WBC: 17.1 10*3/uL — ABNORMAL HIGH (ref 4.0–10.5)

## 2011-02-09 LAB — URINALYSIS, ROUTINE W REFLEX MICROSCOPIC
Bilirubin Urine: NEGATIVE
Glucose, UA: NEGATIVE mg/dL
Hgb urine dipstick: NEGATIVE
Ketones, ur: NEGATIVE mg/dL
Leukocytes, UA: NEGATIVE
Nitrite: NEGATIVE
Protein, ur: 30 mg/dL — AB
Specific Gravity, Urine: 1.023 (ref 1.005–1.030)
Urobilinogen, UA: 0.2 mg/dL (ref 0.0–1.0)
pH: 5.5 (ref 5.0–8.0)

## 2011-02-09 LAB — HEPATIC FUNCTION PANEL
ALT: 20 U/L (ref 0–53)
AST: 30 U/L (ref 0–37)
Albumin: 2.6 g/dL — ABNORMAL LOW (ref 3.5–5.2)
Alkaline Phosphatase: 52 U/L (ref 39–117)
Bilirubin, Direct: 0.1 mg/dL (ref 0.0–0.3)
Indirect Bilirubin: 0.5 mg/dL (ref 0.3–0.9)
Total Bilirubin: 0.6 mg/dL (ref 0.3–1.2)
Total Protein: 6.1 g/dL (ref 6.0–8.3)

## 2011-02-09 LAB — POCT I-STAT 3, ART BLOOD GAS (G3+)
Acid-Base Excess: 5 mmol/L — ABNORMAL HIGH (ref 0.0–2.0)
Bicarbonate: 27.2 mEq/L — ABNORMAL HIGH (ref 20.0–24.0)
O2 Saturation: 90 %
Patient temperature: 98
TCO2: 28 mmol/L (ref 0–100)
pCO2 arterial: 32.3 mmHg — ABNORMAL LOW (ref 35.0–45.0)
pH, Arterial: 7.532 — ABNORMAL HIGH (ref 7.350–7.450)
pO2, Arterial: 51 mmHg — ABNORMAL LOW (ref 80.0–100.0)

## 2011-02-09 LAB — BASIC METABOLIC PANEL
BUN: 15 mg/dL (ref 6–23)
BUN: 19 mg/dL (ref 6–23)
BUN: 26 mg/dL — ABNORMAL HIGH (ref 6–23)
CO2: 28 mEq/L (ref 19–32)
CO2: 29 mEq/L (ref 19–32)
CO2: 31 mEq/L (ref 19–32)
Calcium: 8.2 mg/dL — ABNORMAL LOW (ref 8.4–10.5)
Calcium: 8.4 mg/dL (ref 8.4–10.5)
Calcium: 8.8 mg/dL (ref 8.4–10.5)
Chloride: 100 mEq/L (ref 96–112)
Chloride: 96 mEq/L (ref 96–112)
Chloride: 99 mEq/L (ref 96–112)
Creatinine, Ser: 0.85 mg/dL (ref 0.4–1.5)
Creatinine, Ser: 0.89 mg/dL (ref 0.4–1.5)
Creatinine, Ser: 0.9 mg/dL (ref 0.4–1.5)
GFR calc Af Amer: >60 mL/min (ref >60)
GFR calc Af Amer: >60 mL/min (ref >60)
GFR calc Af Amer: >60 mL/min (ref >60)
GFR calc non Af Amer: >60 mL/min (ref >60)
GFR calc non Af Amer: >60 mL/min (ref >60)
GFR calc non Af Amer: >60 mL/min (ref >60)
Glucose, Bld: 158 mg/dL — ABNORMAL HIGH (ref 70–99)
Glucose, Bld: 299 mg/dL — ABNORMAL HIGH (ref 70–99)
Glucose, Bld: 328 mg/dL — ABNORMAL HIGH (ref 70–99)
Potassium: 4 mEq/L (ref 3.5–5.1)
Potassium: 4.2 mEq/L (ref 3.5–5.1)
Potassium: 4.3 mEq/L (ref 3.5–5.1)
Sodium: 132 mEq/L — ABNORMAL LOW (ref 135–145)
Sodium: 136 mEq/L (ref 135–145)
Sodium: 136 mEq/L (ref 135–145)

## 2011-02-09 LAB — SEDIMENTATION RATE: Sed Rate: 48 mm/hr — ABNORMAL HIGH (ref 0–16)

## 2011-02-09 LAB — COMPREHENSIVE METABOLIC PANEL
ALT: 15 U/L (ref 0–53)
ALT: 22 U/L (ref 0–53)
AST: 32 U/L (ref 0–37)
AST: 34 U/L (ref 0–37)
Albumin: 3 g/dL — ABNORMAL LOW (ref 3.5–5.2)
Albumin: 3 g/dL — ABNORMAL LOW (ref 3.5–5.2)
Alkaline Phosphatase: 63 U/L (ref 39–117)
Alkaline Phosphatase: 71 U/L (ref 39–117)
BUN: 15 mg/dL (ref 6–23)
BUN: 22 mg/dL (ref 6–23)
CO2: 24 mEq/L (ref 19–32)
CO2: 30 mEq/L (ref 19–32)
Calcium: 8.8 mg/dL (ref 8.4–10.5)
Calcium: 9 mg/dL (ref 8.4–10.5)
Chloride: 95 mEq/L — ABNORMAL LOW (ref 96–112)
Chloride: 99 mEq/L (ref 96–112)
Creatinine, Ser: 0.92 mg/dL (ref 0.4–1.5)
Creatinine, Ser: 0.94 mg/dL (ref 0.4–1.5)
GFR calc Af Amer: >60 mL/min (ref >60)
GFR calc Af Amer: >60 mL/min (ref >60)
GFR calc non Af Amer: >60 mL/min (ref >60)
GFR calc non Af Amer: >60 mL/min (ref >60)
Glucose, Bld: 139 mg/dL — ABNORMAL HIGH (ref 70–99)
Glucose, Bld: 332 mg/dL — ABNORMAL HIGH (ref 70–99)
Potassium: 4.1 mEq/L (ref 3.5–5.1)
Potassium: 4.3 mEq/L (ref 3.5–5.1)
Sodium: 136 mEq/L (ref 135–145)
Sodium: 136 mEq/L (ref 135–145)
Total Bilirubin: 0.6 mg/dL (ref 0.3–1.2)
Total Bilirubin: 0.7 mg/dL (ref 0.3–1.2)
Total Protein: 6.7 g/dL (ref 6.0–8.3)
Total Protein: 7.2 g/dL (ref 6.0–8.3)

## 2011-02-09 LAB — URINE MICROSCOPIC-ADD ON

## 2011-02-09 LAB — GLUCOSE, RANDOM
Glucose, Bld: 443 mg/dL — ABNORMAL HIGH (ref 70–99)
Glucose, Bld: 508 mg/dL (ref 70–99)

## 2011-02-09 LAB — URINE CULTURE
Colony Count: NO GROWTH
Culture: NO GROWTH

## 2011-02-09 LAB — CULTURE, BLOOD (ROUTINE X 2)
Culture: NO GROWTH
Culture: NO GROWTH

## 2011-02-09 LAB — DIFFERENTIAL
Basophils Absolute: 0 10*3/uL (ref 0.0–0.1)
Basophils Relative: 0 % (ref 0–1)
Eosinophils Absolute: 0 10*3/uL (ref 0.0–0.7)
Eosinophils Relative: 0 % (ref 0–5)
Lymphocytes Relative: 11 % — ABNORMAL LOW (ref 12–46)
Lymphs Abs: 1.5 10*3/uL (ref 0.7–4.0)
Monocytes Absolute: 0.4 10*3/uL (ref 0.1–1.0)
Monocytes Relative: 3 % (ref 3–12)
Neutro Abs: 11.5 10*3/uL — ABNORMAL HIGH (ref 1.7–7.7)
Neutrophils Relative %: 86 % — ABNORMAL HIGH (ref 43–77)

## 2011-02-09 LAB — CK TOTAL AND CKMB (NOT AT ARMC)
CK, MB: 2.9 ng/mL (ref 0.3–4.0)
Relative Index: 1.8 (ref 0.0–2.5)
Total CK: 161 U/L (ref 7–232)

## 2011-02-09 LAB — LIPID PANEL
Cholesterol: 88 mg/dL (ref 0–200)
HDL: 34 mg/dL — ABNORMAL LOW (ref >39)
LDL Cholesterol: 44 mg/dL (ref 0–99)
Total CHOL/HDL Ratio: 2.6 RATIO
Triglycerides: 52 mg/dL (ref <150)
VLDL: 10 mg/dL (ref 0–40)

## 2011-02-09 LAB — TSH
TSH: 0.77 u[IU]/mL (ref 0.350–4.500)
TSH: 0.88 u[IU]/mL (ref 0.350–4.500)

## 2011-02-09 LAB — FERRITIN: Ferritin: 70 ng/mL (ref 22–322)

## 2011-02-09 LAB — HEMOGLOBIN A1C
Hgb A1c MFr Bld: 7.2 % — ABNORMAL HIGH (ref 4.6–6.1)
Mean Plasma Glucose: 160 mg/dL

## 2011-02-09 LAB — IRON AND TIBC
Iron: <10 ug/dL — ABNORMAL LOW (ref 42–135)
UIBC: 251 ug/dL

## 2011-02-09 LAB — CARDIAC PANEL(CRET KIN+CKTOT+MB+TROPI)
CK, MB: 2.1 ng/mL (ref 0.3–4.0)
CK, MB: 2.5 ng/mL (ref 0.3–4.0)
Relative Index: 2 (ref 0.0–2.5)
Relative Index: INVALID (ref 0.0–2.5)
Total CK: 104 U/L (ref 7–232)
Total CK: 98 U/L (ref 7–232)
Troponin I: 0.14 ng/mL — ABNORMAL HIGH (ref 0.00–0.06)
Troponin I: 0.2 ng/mL — ABNORMAL HIGH (ref 0.00–0.06)

## 2011-02-09 LAB — POCT CARDIAC MARKERS
CKMB, poc: 1.4 ng/mL (ref 1.0–8.0)
Myoglobin, poc: 214 ng/mL (ref 12–200)
Troponin i, poc: 0.14 ng/mL — ABNORMAL HIGH (ref 0.00–0.09)

## 2011-02-09 LAB — T4, FREE: Free T4: 1.07 ng/dL (ref 0.89–1.80)

## 2011-02-09 LAB — RETICULOCYTES
RBC.: 3.93 MIL/uL — ABNORMAL LOW (ref 4.22–5.81)
Retic Count, Absolute: 27.5 10*3/uL (ref 19.0–186.0)
Retic Ct Pct: 0.7 % (ref 0.4–3.1)

## 2011-02-09 LAB — C-REACTIVE PROTEIN: CRP: 3 mg/dL — ABNORMAL HIGH (ref <0.6)

## 2011-02-09 LAB — PROTIME-INR
INR: 1 (ref 0.00–1.49)
Prothrombin Time: 13.2 seconds (ref 11.6–15.2)

## 2011-02-09 LAB — MAGNESIUM
Magnesium: 1.4 mg/dL — ABNORMAL LOW (ref 1.5–2.5)
Magnesium: 2.2 mg/dL (ref 1.5–2.5)

## 2011-02-09 LAB — TROPONIN I: Troponin I: 0.41 ng/mL — ABNORMAL HIGH (ref 0.00–0.06)

## 2011-02-09 LAB — FOLATE
Folate: 12.1 ng/mL
Folate: 19.3 ng/mL

## 2011-02-09 LAB — VITAMIN B12
Vitamin B-12: 232 pg/mL (ref 211–911)
Vitamin B-12: 465 pg/mL (ref 211–911)

## 2011-02-09 LAB — D-DIMER, QUANTITATIVE: D-Dimer, Quant: 1.04 ug/mL-FEU — ABNORMAL HIGH (ref 0.00–0.48)

## 2011-02-09 LAB — APTT: aPTT: 30 seconds (ref 24–37)

## 2011-03-09 NOTE — H&P (Signed)
[name], [name]            ACCOUNT NO.:  [id number]   MEDICAL RECORD NO.:  [id number]          PATIENT TYPE:  INP   LOCATION:  1826                         FACILITY:  [facility]   PHYSICIAN:  Kela Millin, M.D.DATE OF BIRTH:  08-08-1934   DATE OF ADMISSION:  08/27/2007  DATE OF DISCHARGE:                              HISTORY & PHYSICAL   PRIMARY CARE PHYSICIAN:  Thora Lance, M.D.   CHIEF COMPLAINT:  Fevers and difficulty breathing.   HISTORY OF PRESENT ILLNESS:  The patient is a 77 year old white male  with past medical history significant for obstructive sleep apnea, on  CPAP, coronary artery disease, status post MI and status post CABG,  hypertension, diabetes mellitus, hypothyroidism, hypercholesterolemia,  history of cleft palate and deviated nasal septum, who presents with the  above complaints.  He states that he was in his usual state of health  until 3 days ago when he developed fevers, up to 103 per his wife, as  well as a nonproductive cough.  He admits to chest pain, stating that it  is mostly when he coughs.  He developed shortness of breath and so came  to the ER for further evaluation.  He denies nausea, vomiting, dysuria,  melena, diarrhea, and no hematochezia.   The patient was seen in the ER, and a chest x-ray was done which  revealed a lingular pneumonia, his white cell count 11.7.  He is  admitted for further evaluation and management.   PAST MEDICAL HISTORY:  As above.   PAST SURGICAL HISTORY:  1. Status post neck surgery.  2. Status post carpal tunnel surgery.   MEDICATIONS:  1. Omeprazole 20 mg daily.  2. Altace 10 mg daily.  3. Actos 45 mg daily.  4. Levothyroxine 25 mcg daily.  5. Zetia 10 mg daily.  6. Metformin 1000 mg b.i.d.  7. Metoprolol 25 mg b.i.d.  8. Lantus 44 units subcutaneously  at bedtime.  9. NovoLog 9 units q.a.m. and q.p.m.  10.Soma 350 mg q.8 h. with Tylenol Arthritis.   ALLERGIES:  NKDA.   SOCIAL HISTORY:  He is  retired.  He quit tobacco in February 2000.  He  also quit alcohol in February 2000.   FAMILY HISTORY:  His father had asthma, COPD, CHF, and diabetes.  His  mother had hypertension, hypocholesterolemia, and dementia.  He has a  brother with diabetes and Alzheimer's and a sister with diabetes.   REVIEW OF SYSTEMS:  As per HPI.  Other review of systems negative.   PHYSICAL EXAMINATION:  GENERAL:  The patient is an acutely ill-appearing  elderly white male, in no respiratory distress, with nasal cannula  oxygen on.  VITAL SIGNS:  Temperature is 100, his blood pressure is 160/83, with a  pulse of 100, O2 saturation 96%.  HEENT:  Dry mucous membranes, PERRL, EOMI, well-healed scar on the upper  lip,, no oral exudates.  NECK:  Supple.  No adenopathy, and no JVD.  LUNGS:  Few bronchial breath sounds on left, no wheezes.  CARDIOVASCULAR:  Regular rate and rhythm.  Normal S1, S2.  ABDOMEN:  Obese, bowel sounds  present, nontender, nondistended.  No  organomegaly and no masses palpable.  EXTREMITIES:  No cyanosis, and no edema.  NEUROLOGIC:  He is awake and oriented x3.  Cranial nerves II-XII are  grossly intact.  Nonfocal exam.   LABORATORY DATA:  His chest x-ray shows lingular infiltrate and  cardiomegaly.  His white cell count is 11.7, with a hemoglobin of 10.6,  hematocrit of 31.3, platelet count of 196, neutrophil count is 82%.  His  sodium is 132, with potassium of 4, chloride 100, BUN of 12, glucose  181.  His pH is 7.456, with a PCO2 of 34, bicarbonate of 23.9.  Creatinine is 1.   ASSESSMENT AND PLAN:  1. Pneumonia, lingular.  Obtain blood cultures, and place on empiric      antibiotics and expectorants.  2. Hyponatremia, mild.  Likely secondary to volume depletion.  Hydrate      and recheck.  3. Diabetes mellitus.  Continue Lantus and NovoLog.  Hold metformin      for now.  Follow and resume as appropriate.  4. Hypertension.  Continue outpatient medications.  5. Coronary artery  disease, and history of congestive heart failure.      He is status CABG.  Will obtain an EKG and cardiac enzymes.      Continue outpatient medications.  6. Obstructive sleep apnea.  Continue CPAP.  7. Hypothyroidism.  Continue Levoxyl.  8. Hypercholesterolemia.  Continue Zocor.      Kela Millin, M.D.  Electronically Signed     ACV/MEDQ  D:  08/27/2007  T:  08/28/2007  Job:  [id number]   cc:   Thora Lance, M.D.

## 2011-03-09 NOTE — Cardiovascular Report (Signed)
[name], [name] NO.:  [id number]   MEDICAL RECORD NO.:  [id number]          PATIENT TYPE:  OIB   LOCATION:  1961                         FACILITY:  [facility]   PHYSICIAN:  Armanda Magic, M.D.     DATE OF BIRTH:  25-Feb-1934   DATE OF PROCEDURE:  09/06/2008  DATE OF DISCHARGE:  09/06/2008                            CARDIAC CATHETERIZATION   REFERRING PHYSICIAN:  Thora Lance, MD   PROCEDURE:  Left heart catheterization, coronary angiography, and left  ventriculography.   OPERATOR:  Armanda Magic, MD   INDICATIONS:  Congestive heart failure and history of coronary artery  disease.   COMPLICATIONS:  None.   IV ACCESS:  Via right femoral artery 5-French sheath.   IV MEDICATIONS:  Versed 1 mg and fentanyl 25 mcg.   This is a 77 year old male who has a history of coronary artery disease  and is status post coronary artery bypass grafting by Dr. Cornelius Moras  in 2000  at which time he underwent LIMA to the LAD, saphenous vein graft to the  second diagonal, saphenous vein graft to first circumflex marginal.  He  now presents for cardiac catheterization because of significant heart  failure symptoms.  The patient was brought to the Cardiac  Catheterization Laboratory in a fasting nonsedated state.  Informed  consent was obtained.  The patient was connected to continuous heart  rate and pulse oximetry monitoring and intermittent blood pressure  monitoring.  The right groin was prepped and draped in the sterile  fashion.  Xylocaine 1% was used for local anesthesia.  Using modified  Seldinger technique, a 4-French sheath was placed in the right femoral  artery.  Under fluoroscopic guidance, a 4-French JL-4 catheter was  placed in left coronary artery.  Cine films were taken in the straight  AP view showing a heavily calcified left main, left circumflex, and  proximal LAD.  The left main is occluded in the proximal portion.  The  catheter was then exchanged out over a  guidewire for a 4-French RCB  catheter which could not engage the right coronary ostium.  The catheter  did engage the saphenous vein graft to the obtuse marginal branch.  Multiple cine films were taken at 30-degree RAO and 40-degree LAO views.  The saphenous vein graft to the OM was widely patent and there was no  evidence of disease in the obtuse marginal branch.   The catheter was attempted to be placed into the saphenous vein graft to  the diagonal, but was unsuccessful.  The catheter was exchanged out over  a guidewire for a 4-French JR-4 catheter, which again could not  successfully engaged the right coronary ostium.  It also could not  engage the saphenous vein graft to the diagonal.  The catheter was  pulled back and advanced into the right subclavian artery.  The catheter  was exchanged out over a long exchange wire for a 4-French LIMA  catheter, which successfully engaged the left internal mammary artery  ostium.  Angiography was performed in the straight lateral position and  30-degree RAO views.  The catheter was then removed over  a guide wire  and a 4-French angled pigtail catheter was placed under fluoroscopic  guidance into the vicinity of the aortic valve, but it could not cross  the aortic valve because of difficulty in torquing the catheter.  The  catheter was exchanged out over a guidewire.  The 4-French sheath was  upgraded to a 5-French sheath and a 5-French angled pigtail catheter was  placed in the left ventricular cavity.  Left ventriculography was  performed in the 30-degree RAO view using a total of 25 mL of contrast  at 12 mL per second.  The catheter was then pulled back across the  aortic valve with no significant gradient noted.  The catheter was then  placed just above the aortic valve and aortic root angiography was  performed using a total of 20 mL of contrast at 50 mL per second.  The  catheter was then removed over a guidewire.  A 5-French JR-4  catheter  was then attempted to engage the right coronary ostium but could not  engage it.  It was exchanged out over a guidewire for a 5-French RCB  catheter, again which could not engage the right coronary ostium.  The  catheter was removed over a guide wire and my colleague Dr. Eldridge Dace  stepped in and was able to successfully engage the right coronary ostium  with a 5-French JR-4 catheter.  Multiple cine films were taken at 30-  degree RAO and 40-degree LAO views.  This catheter was then pulled back  into the saphenous vein graft to the diagonal and multiple cine films  were taken at 30-degree RAO 40-degree LAO views.  This catheter was then  removed over a guidewire.  At the end of the procedure, all catheters  and sheaths were removed.  Manual compression was performed until  adequate hemostasis was obtained.  The patient transferred back to room  in stable condition.   RESULTS:  1. The left main coronary artery is heavily calcified and the calcium      extends down into the left circumflex and left anterior descending      arteries.  The left main coronary artery is occluded proximally.   The saphenous vein graft to the obtuse marginal branch is large and  widely patent.  There is evidence of retrograde flow up into the  circumflex artery.  The obtuse marginal branch is widely patent  throughout its course.  The left internal mammary artery to the LAD is a  small graft, but is widely patent and inserts into the LAD distally.  There is no evidence of any obstructive disease in the LIMA graft itself  and the LAD distally is small, but widely patent.  There is evidence of  competitive flow into the mid LAD.  The left ventriculography revealed  markedly dilated left ventricular cavity dimensions with severe LV  dysfunction, EF 20-25%.  There does appear to be some mild mitral  regurgitation associated with this.   Aortic root angiography showed an anterior takeoff of the right  coronary  artery, possibly off the left coronary cusp.   The right coronary artery is widely patent and distally bifurcates into  a posterior descending artery and posterolateral artery, both of which  are widely patent.  There is calcification of the RCA in the proximal  portion.   The saphenous vein graft to the diagonal branch is widely patent.  Proximal to the insertion of the graft into the diagonal branch, there  is about 80%  stenosis within the diagonal branch.  Distal to the  insertion of the saphenous vein graft to the diagonal, there is no  evidence of disease.  There is evidence of retrograde flow from the  diagonal into the LAD and the distal LAD is visible with evidence of  competitive flow.   ASSESSMENT:  1. Occluded left main which is heavily calcified extending into the      left anterior descending artery and left circumflex artery.  2. Saphenous vein graft to the obtuse marginal widely patent.  3. Saphenous vein graft the diagonal branch widely patent with      evidence of flow back into the left anterior descending artery and      distally, there is evidence of competitive flow in the distal left      anterior descending into the left internal mammary artery graft.  4. Patent right coronary artery.  5. Markedly dilated left ventricular with severe left ventricular      dysfunction at 20%.   PLAN:  Discharge to home after IV fluid, bedrest, refer to Dr. Amil Amen  for AICD implantation.  We will continue him on his aspirin daily as  well as Lasix and increase his Altace as blood pressure tolerates.  We  will also switching him from metoprolol to Coreg  6.25 mg b.i.d.  He  will follow up with me in 1 week.      Armanda Magic, M.D.  Electronically Signed     TT/MEDQ  D:  09/06/2008  T:  09/07/2008  Job:  [id number]   cc:   Thora Lance, M.D.

## 2011-03-09 NOTE — Consult Note (Signed)
NAMEARY, LAVINE NO.:  [id number]   MEDICAL RECORD NO.:  [id number]          PATIENT TYPE:  INP   LOCATION:  2111                         FACILITY:  [facility]   PHYSICIAN:  Coralyn Helling, MD        DATE OF BIRTH:  1934/05/11   DATE OF CONSULTATION:  11/28/2008  DATE OF DISCHARGE:                                 CONSULTATION   REFERRING PHYSICIAN:  Michiel Cowboy, MD   [name] is a 77 year old male who I have previously seen in the  office for his sleep apnea.  He says that approximately 2 to 3 days ago,  he developed progressive dyspnea and a cough with clear yellow sputum.  He also had a fever up to 101.2.  He was started on prednisone and  inhalers as an outpatient approximately 2 days prior to admission.  His  symptoms continued to progress and as a result, his wife brought him to  the emergency room.  In the emergency room, he had a chest x-ray which  showed a new right lower lobe infiltrate and he was diagnosed with  community-acquired pneumonia.  Blood cultures have been sent in the  emergency room and he was started on intravenous Avelox.  In addition,  he has also been given nebulizer treatments as well as systemic  corticosteroids.  He says that his breathing has improved some, although  he still feels some right-sided pleuritic chest pain as well as  tightness and wheezing.  He denies any headaches or dizziness.  He has  not had any sinus symptoms.  He also denies a sore throat.  He did not  have any new abdominal symptoms as far as nausea, vomiting, or diarrhea.  He denies any skin rashes or leg swelling.  His wife states that she  thinks she might be coming down with a bronchitis also.  [name]  did have his pneumonia shot previously and he says that he had his H1N1  vaccination last month.   His past medical history is significant for:  1. COPD.  2. Obstructive sleep apnea, on CPAP 9 cm water with a sleep study done      in August  2007.  3. Hypertension.  4. Coronary disease status post myocardial infarction and status post      coronary bypass grafting.  5. Congestive heart failure with systolic dysfunction.  6. Status post AICD.  7. Diabetes mellitus.  8. Hypothyroidism.  9. Dyslipidemia.  10.Cleft palate.  11.Nasal septal deviation.  12.Carpal tunnel release.  13.Neck surgery.  14.Pneumonia.   He has no known drug allergies.   His outpatient medications:  1. Caltrate  2. Aspirin 325 mg daily.  3. Lipitor 80 mg q.h.s.  4. Lantus.  5. NovoLog insulin.  6. Furosemide 40 mg daily.  7. Spironolactone 25 mg daily.  8. Omeprazole 20 mg daily.  9. Ramipril 15 mg daily.  10.Synthroid 25 mcg daily.  11.Actos 30 mg daily.  12.Metformin 1000 mg b.i.d.  13.Coreg 12.5 mg b.i.d.   His family history is significant for his father with COPD, heart  disease,  and diabetes.  His mother had hypertension, dyslipidemia, and  dementia.  His sister had diabetes and his brother had dementia and  diabetes.   SOCIAL HISTORY:  He is married.  He has 2 children.  He is retired from  [employer].  He quit smoking in 2000.  He started smoking at the 49.  He  smokes up to 2-3 packs of cigarettes per day.   PHYSICAL EXAMINATION:  GENERAL:  He is seen in the emergency room.  He  is awake, alert, appears to have some increased work of breathing.  He  is on supplemental oxygen.  VITAL SIGNS:  His temperature was 97.9, blood pressure is 153/75, heart  rate is 61, respiratory rate is 24, oxygen saturation is 94%.  HEENT:  Pupils are reactive.  Extraocular muscles intact.  There was no sinus  tenderness.  He has repair from a previous cleft palate.  There is no  other oral lesions.  There is no lymphadenopathy, no thyromegaly.  HEART:  S1, S2.  No murmur.  He has an AICD noted in the left upper  chest.  CHEST:  He has prolonged expiratory phase, diffuse wheezing more  prominent in the right lower lung field as well as crackles  heard in the  right lower lung field.  ABDOMEN:  Obese, soft, nontender.  Positive bowel sounds.  No  organomegaly.  EXTREMITIES:  There was no edema, cyanosis, or clubbing.  NEUROLOGIC:  He had normal strength and was able to follow commands  appropriately.   Chest x-ray showed right base infiltrate as well as left base  atelectasis.  Arterial blood gas showed a pH of 7.53, pCO2 32, pO2 of  51, hemoglobin 10.9, hematocrit 32.3, WBC is 13.5, platelet count is  189.  Troponin of 0.41, CK is 161, CK-MB was 2.9, calcium was 8.8,  magnesium was 1.4, BNP was 385.   IMPRESSION:  1. Community-acquired pneumonia.  Again, his cultures have been sent      from the emergency room.  He has been started on intravenous Avelox      and I will continue this.  2. Chronic obstructive pulmonary disease exacerbation.  I will      continue him on round-the-clock nebulizer treatment as well as      systemic corticosteroids.  3. Hypoxemia.  I will titrate his oxygen level to keep his oxygen      saturation greater than 90%.  4. History of coronary disease, congestive heart failure,      hypertension, and status post AICD placement.  He did have a slight      increase in his troponin level.  It is hard to tell whether this is      related to demand ischemia as his EKG did not show any acute      changes.  I will defer further management of this to his primary      team and if needed, Cardiology consultation could be considered.  5. Obstructive sleep apnea.  He is to continue on CPAP therapy.  6. Diabetes mellitus.  I will defer management of this to the primary      team.  7. Hypothyroidism.  He is continue on the levothyroxine replacement      therapy.  8. Anemia.  He is hemodynamically stable.  I do not think he needs a      transfusion at this time.  I will follow up his hemoglobin level  and depending upon the trend, consideration could be given to      checking an anemia panel.  9.  Hypomagnesemia.  I will defer management of this to the primary      team.      Coralyn Helling, MD  Electronically Signed     VS/MEDQ  D:  11/28/2008  T:  11/28/2008  Job:  [id number]   cc:   Thora Lance, M.D.

## 2011-03-09 NOTE — Discharge Summary (Signed)
NAMEMACCOY, HAUBNER NO.:  [id number]   MEDICAL RECORD NO.:  [id number]          PATIENT TYPE:  INP   LOCATION:  5506                         FACILITY:  [facility]   PHYSICIAN:  Thora Lance, M.D.  DATE OF BIRTH:  10-15-1934   DATE OF ADMISSION:  08/26/2007  DATE OF DISCHARGE:  08/30/2007                               DISCHARGE SUMMARY   REASON FOR ADMISSION:  A 77 year old white male who presented with  fevers as high as up to 103, non-productive cough and shortness of  breath.   FINDINGS:  Temperature 100, blood pressure 120/53, pulse 83, saturating  __________  on room air. Dry mucous membranes.  Neck supple.  No  adenopathy.  Lungs good breath sounds.  Heart regular rate and rhythm.  Abdomen benign.   LABORATORY/CHEST X-RAY:  Lingular infiltrate, cardiomegaly.  White cells  11.7, hemoglobin 10.6, platelet count 196,000.  Sodium 132, potassium  4.0, chloride 100, BUN 12, glucose 181, creatinine 1.0.   HOSPITAL COURSE:  Pneumonia:  The patient was admitted with a left lung  pneumonia.  He was treated with IV Avelox.  Oxygen saturation remained  normal and oxygen could be discontinued after 24 hours.  He was treated  with the CPAP pump.  He gradually improved.  His white count normalized.  His shortness of breath improved and he was able to ambulate without  difficulty at discharge.  He was afebrile after 48 hours in the  hospital.  He was mildly anemic with a hemoglobin last measured at 9.2  on November 4th.  This will need to be rechecked as an outpatient.  His  chronic medical problems have remained stable.   DISCHARGE DIAGNOSES:  1. Left lung pneumonia.  2. Coronary artery disease.  3. Hypertension.  4. Diabetes mellitus.  5. Hypothyroidism.  6. Hypercholesterolemia.  7. Obstructive sleep apnea.   PROCEDURE:  None.   DISCHARGE MEDICATIONS:  1. Avelox 400 mg 1 a day for six days.  2. Omeprazole 20 mg 1 a day.  3. Altace 10 mg 1 a day.  4. Actos  45 mg 1 a day.  5. Levothyroxine 0.25 mg 1 a day.  6. Zetia 10 mg 1 a day.  7. Metformin 1000 mg twice a day.  8. Metoprolol 25 mg 1 twice a day.  9. Insulin Lantus 43 units at bedtime.  10.NovoLog insulin 9 units before breakfast and dinner.   DISPOSITION:  Discharged to home.   ACTIVITY:  As tolerated.   FOLLOW UP:  In one week with Dr. Kirby Funk.   DIET:  Low-sodium, diabetic diet.           ______________________________  Thora Lance, M.D.     JJG/MEDQ  D:  08/30/2007  T:  08/30/2007  Job:  [id number]

## 2011-03-09 NOTE — Discharge Summary (Signed)
[name], [name] NO.:  [id number]   MEDICAL RECORD NO.:  [id number]          PATIENT TYPE:  INP   LOCATION:  5036                         FACILITY:  [facility]   PHYSICIAN:  Thora Lance, M.D.  DATE OF BIRTH:  January 12, 1934   DATE OF ADMISSION:  11/28/2008  DATE OF DISCHARGE:  12/02/2008                               DISCHARGE SUMMARY   REASON FOR ADMISSION:  This is a 77 year old white male, who presented  on November 28, 2008, with complaints of increased shortness of breath  and wheezing.  The patient had been to the office 2 days later and been  diagnosed with asthmatic bronchitis and placed on steroids and  bronchodilators.  He got short of breath in preceding 24 hours.  He was  hypoxemic in the emergency room.   SIGNIFICANT FINDINGS:  VITAL SIGNS:  Blood pressure was 153/75, heart  rate 61, respirations 24, and temperature 98.2.  LUNGS:  Extensive wheezing bilaterally.  HEART:  Regular rate and rhythm.  ABDOMEN:  Soft and nontender.  EXTREMITIES:  No edema.   LABORATORY:  WBC 13.5 and hemoglobin 10.9.  Sodium 136, potassium 4.2,  creatinine 0.9, and magnesium 1.4.  BNP 385.  Troponin 0.14.  ABG; pH  7.52, pCO2 of 32, and pO2 of 52.  Chest x-ray showed a mild left basal  infiltrate and right lung base airspace disease.   HOSPITAL COURSE:  1. The patient was admitted for COPD exacerbation and pneumonia.      Pulmonary was consulted.  The patient initially was sent to the      ICU.  He was initially treated with non-rebreather mask, but then      quickly weaned down to oxygen by nasal cannula.  He was started on      IV Rocephin and azithromycin and treated with IV steroids and      nebulizers.  He did require 1 dose of IV Lasix for mild volume      overload.  The patient's respiratory status quickly improved with      treatment and by discharge he was off oxygen and saturating well      with oxygen saturation on room air at 94%.  The patient was weaned     from IV to oral steroids and also on antibiotics.  His wheezing      improved and he was ambulating without much dyspnea at discharge.  2. Congestive heart failure.  The patient did not evidence significant      congestive heart failure during this admission.  He was given 1      dose of IV Lasix and continued on his outpatient medications.      Because of the concern of his low EF and Actos exacerbating CHF,      his Actos was discontinued.  3. Diabetes mellitus.  As mentioned above, Actos discontinued.  On      steroids, the patient's blood sugars were in 300 and 400 range.  He      was treated with higher doses of Lantus and sliding scale insulin.      At  discharge, his blood sugars are down 145 as his steroids were      being tapered.   DISCHARGE DIAGNOSES:  1. Pneumonia.  2. Chronic obstructive pulmonary disease exacerbation.  3. Diabetes mellitus.  4. Congestive heart failure.  5. Coronary artery disease.  6. Ischemic cardiomyopathy.  7. Hypertension.  8. Obstructive sleep apnea.   PROCEDURES:  None.   DISCHARGE MEDICATIONS:  1. Prednisone 20 mg 2 a day for 1 day, 1 a day for 2 days, and 0.5 a      day for 2 days, and then discontinue.  2. Azithromycin 250 mg 1 a day for 1 day then stop.  3. Ceftin 250 mg 1 twice a day for 3 days and stop.  4. Insulin Lantus 60 units on Tuesday and then 50 units a day.  5. Humalog 12 units twice a day.  6. Levothyroxine 25 mcg 1 a day.  7. Lipitor 80 mg 1 a day.  8. Aspirin 325 mg 1 a day.  9. Carvedilol 12.5 mg twice a day.  10.Altace 50 mg once a day.  11.Spironolactone 25 mg a day.  12.Furosemide 40 mg 1 a day.  13.Omeprazole 20 mg 1 a day.  14.Metformin 1000 mg twice a day.   DISPOSITION:  Discharged to home.  Follow up in 1 week with Dr. Valentina Lucks.   CODE STATUS:  Full code.   DIET:  Low-sodium diabetic diet.           ______________________________  Thora Lance, M.D.     JJG/MEDQ  D:  12/02/2008  T:  12/02/2008   Job:  [id number]

## 2011-03-09 NOTE — H&P (Signed)
[name], [name] NO.:  [id number]   MEDICAL RECORD NO.:  [id number]          PATIENT TYPE:  INP   LOCATION:  2111                         FACILITY:  [facility]   PHYSICIAN:  Michiel Cowboy, MDDATE OF BIRTH:  03-11-1934   DATE OF ADMISSION:  11/28/2008  DATE OF DISCHARGE:                              HISTORY & PHYSICAL   PRIMARY CARE PHYSICIAN:  Thora Lance, M.D.   CHIEF COMPLAINT:  Shortness of breath, wheezing.   The patient is a 77 year old gentleman with known history of coronary  artery disease and ischemic cardiomyopathy with EF of 20-25% with  markedly dilated left ventricle.  Also history of diabetes and COPD.  The patient reports the past 3 weeks he had been having progressively  worsening shortness of breath and wheezing, coughing.  He presented to  his primary care [name] a few days ago, started on prednisone taper  not antibiotics though but proceeded to still do poorly and became  progressively more and more short of breath as well as he had been  having some right-sided pleuritic chest pain.  He does have history of  COPD.  The patient was given Solu-Medrol and some breathing treatment by  EMS, continued to still be very short of breath at which point he was  brought into the emergency department and [facility] hospitalist was called.  The patient had a chest x-ray which was suggestive of pneumonia.   REVIEW OF SYSTEMS:  Significant for shortness of breath, wheezing, right-  sided chest pain, worsening dyspnea on exertion, mild orthopnea, mild  lower extremity swelling bilaterally, no travel history.  Per the family  he reportedly had recent AICD placement some time in December.   PAST MEDICAL HISTORY:  1. Significant for extensive coronary artery disease and ischemic      cardiomyopathy with EF of 20% status post AICD placement and      followed by Dr. Mayford Ramirez.  2. History of sleep apnea on CPAP at home.  3. Diabetes.  4. Hypertension.  5. History of emphysema, not on any medications at home.  6. History of hypertension.  7. History of myocardial infarction.  8. Hyperlipidemia.  9. Hypothyroidism.   SOCIAL HISTORY:  The patient used to smoke heavily but quit and not  currently smoking.  Does not drink alcohol.  Does not abuse drugs.  Lives at home and has involved family.   FAMILY HISTORY:  Noncontributory.   ALLERGIES:  No known drug allergies.   MEDICATIONS:  1. Lantus 60 units subcu at bedtime.  2. NovoLog sliding scale.  3. Lasix 40 mg q. a.m.  4. Spironolactone 25 mg q.a.m.  5. Omeprazole 20 mg p.o. q.a.m.  6. Ramipril 15 mg p.o. daily.  7. Levothyroxine 25 mcg p.o. daily.  8. Actos 30 mg p.o. daily.  9. Metformin 1000 mg twice a day.  10.Carvedilol 12.5 mg two times a day, apparently had been increased      from 6.25.  11.Lipitor 80 mg p.o. daily.  12.Aspirin 325 mg daily.  13.Calcitriol 600 plus Vitamin D once daily.  14.Recently started on prednisone Dosepak and albuterol  inhaler.   PHYSICAL EXAMINATION:  VITAL SIGNS:  Temperature 98.2, blood pressure  153/75, pulse 61, respirations 24, satting 99% on 100% nonrebreather and  down to mid 90s on 50% nonrebreather.  GENERAL:  The patient appears not able to speak in complete sentences  but not in acute respiratory distress.  HEENT:  Head nontraumatic.  Moist mucous membranes.  LUNGS:  Extensive wheezing bilaterally.  HEART:  Irregular rate and rhythm.  No murmurs appreciated secondary to  severe lung sounds.  ABDOMEN:  Soft, nontender, nondistended.  LOWER EXTREMITIES:  There is trace bilateral edema but otherwise  unremarkable.  NEUROLOGIC:  Intact.  Alert and oriented times four.   LABS:  White blood cell count 13.5, hemoglobin 10.9, sodium 136,  potassium 4.2, creatinine 0.9 and magnesium 1.4, BNP 385, troponin 0.14.  ABG 7.527/32.7/52 with O2 saturation of 90% on 3 liters per respiratory.  EKG nonischemic showing T-wave flattenings and  inversion lead V1-V2  which is old, some also T-wave flattening in V6 with F wave in lead 2  and 3 noted but that is not.  Chest x-ray concerning for possible mild  left basal involvement and atelectasis in the right lung base airspace  disease.  Question aspiration pneumonia.  The patient would not endorse  any symptoms of aspiration.  Troponin 0.14.   ASSESSMENT/PLAN:  This is a 77 year old gentleman with extensive history  of coronary artery disease, diabetes, chronic obstructive pulmonary  disease and congestive heart failure who presents with possible  pneumonia/chronic obstructive pulmonary disease exacerbation.  1. Pneumonia.  Will for right now cover with Avelox and consider      broadening coverage if aspiration becomes more stronger      possibility.  At this point he did not endorse any history of      aspiration.  Given low pulmonary reserve secondary to renal and      history of chronic obstructive pulmonary disease and severe      congestive heart failure will place in ICU and give the patient on      50% nonrebreather currently and have CCM see the patient today.      Will continue with albuterol nebs for wheezing and schedule      Atrovent nebs.  Will make sure he is on prednisone and guaifenesin.  2. History of coronary artery disease with elevated troponin.  Have      discussed this with cardiology who recommended official cardiology      consult in the a.m.  Will cycle cardiac enzymes.  Continue cardiac      meds.  Follow BNP.  The slight elevation in troponin could be      possibly related to demand from pneumonia and hypoxia.  3. History of ischemic heart failure with EF of 20%.  Will defer to      cardiology for management.  The patient will likely need      multidisciplinary approach.  4. Severe shortness of breath, etiology pneumonia/chronic obstructive      pulmonary disease exacerbation but also given pleuritic chest pain      will obtain D-dimer to check for  pulmonary embolus.  If positive      will get a CT of the chest.  5. History of diabetes.  Continue Lantus and sliding scale insulin and      adjust Lantus as needed while the patient is in-house to maintain      appropriate blood sugar control.  Hold metformin.  6. History  of sleep apnea.  Will continue CPAP at this point as no      need for BiPAP but will watch for possibility if the patient work      of breathing increases it may become necessary.  7. Hypomagnesemia.  Magnesium level 1.4.  Will replace.  8. History of hypothyroidism.  Check TSH.  Continue on Synthroid.  9. Prophylaxis.  Protonix plus Lovenox.   CODE STATUS:  The patient is full code.  This was discussed with him and  his family.      Michiel Cowboy, MD  Electronically Signed     AVD/MEDQ  D:  11/28/2008  T:  11/28/2008  Job:  [id number]   cc:   Thora Lance, M.D.

## 2011-03-12 NOTE — Assessment & Plan Note (Signed)
[facility]                               PULMONARY OFFICE NOTE   NAME:CAUTHRENSunny, Gains                   MRN:          [id number]  DATE:07/13/2006                            DOB:          [birth year]    REFERRING PHYSICIAN:  Armanda Magic, M.D.   I had the pleasure of meeting Mr. Sutcliffe with his wife today for  evaluation of his sleep difficulties.  He had undergone an overnight  polysomnogram on June 14, 2006 at [facility] and this  showed apnea/hypopnea index of 35.5 with an oxygen saturation of 80%.  Of  note is that he had a significant positional effect to his sleep apnea as  well.  He was referred to the sleep lab initially because of complaints of  dyspnea as well as sleep disturbance.  His wife notes that he snores quite  loudly as well as the patient stops breathing at times.  He will also wake  up with a grunting sound.  He tends to breathe more through his mouth.  He  also is a fairly restless sleeper and does talk in his sleep as well.  His  current sleep pattern is that he will go to bed around 10 o'clock at night.  He falls asleep fairly quickly.  He will wake up two to three times a night  to use the bathroom but then goes back to sleep.  He says that he feels like  sometime he could sleep better if he is sitting up on the couch as opposed  to being in bed.  He wakes up at 5 in the morning and says he feels okay  initially but then as the day goes of if he is not active he will end up  taking a nap for about 30 minutes once or twice a day.  His Epworth score  today is 13/24.  He is not currently using anything to help him fall asleep  at night or stay awake during the day.  He denies any symptoms of sleep  walking, nightmares, night terrors, paroxysm, restless leg syndrome, sleep  hallucinations, sleep paralysis or cataplexy.   PAST MEDICAL HISTORY:  1. Significant for hypertension.  2. Coronary artery disease,  status post myocardial infarction.  He is      status post coronary bypass grafting.  3. He has congestive heart failure with an ejection fraction of 40%.  4. Diabetes.  5. Hypothyroidism.  6. Elevated cholesterol.  7. Cleft palate.  8. Deviated nasal septum.  9. He has had neck surgery.  10.He has had bypass surgery.  11.He has had surgery for carpal tunnel syndrome.   MEDICATIONS:  1. Levothyroxine 25 mcg daily.  2. Altace 10 mg daily.  3. Omeprazole 20 mg daily.  4. Aspirin 325 mg daily.  5. Lipitor 80 mg daily.  6. Zetia 10 mg daily.  7. Lantus insulin 30 units daily.  8. Metformin 1000 mg b.i.d.  9. Glipizide 10 mg b.i.d.  10.Metoprolol 25 mg b.i.d.  11.Actos 45 mg daily.   ALLERGIES:  He has no known drug  allergies,.   FAMILY HISTORY:  Significant for his father who had asthma, COPD, heart  failure and diabetes.  Mother had hypertension, elevated cholesterol and  dementia.  His sister had diabetes and staph infection and he has a brother  who has diabetes and Alzheimer's disease.   SOCIAL HISTORY:  He is married.  He has two children.  He is retired.  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  He quit smoking and drinking in February of 2000.  He used to smoke  two to three packs of cigarettes per day, started at the age of [age].   REVIEW OF SYSTEMS:  He says that he had undergone pulmonary function tests  approximately two to three weeks ago.  He does get occasional wheezing as  well as episodes of coughing.  He says he gets short of breath after walking  two flights of steps.  He denies having any significant sputum production.   PHYSICAL EXAMINATION:  He is 5 feet 9 inches tall, weight is 224 pounds,  temperature is 97.8, blood pressure is 130/74, heart rate is 74, oxygen  saturation is 92% on room air.  HEENT:  Pupils are reactive.  Extraocular muscles are intact.  He has a  nasal septal deviation to the right.  He  has clear nasal discharge.  He has  a cleft lip deformity with a cleft palate brace in place.  He has an MP 2  airway with a decreased AP diameter to the oropharynx.  There is no  lymphadenopathy, no thyromegaly.  HEART:  S1 and S2, regular rhythm.  CHEST:  Clear to auscultation.  ABDOMEN:  Obese, soft and nontender.  EXTREMITIES:  No edema, cyanosis, clubbing.  NEUROLOGIC EXAM:  He was alert and oriented x3.  5/5 strength.  No  cerebellar deficit was appreciated.   IMPRESSION:  Severe obstructive sleep apnea as demonstrated by  apnea/hypopnea index of 36 with an oxygen saturation nadir of 88%.  I had  reviewed the results of the sleep study with him in detail.  I discussed the  adverse consequences of untreated sleep apnea including increase risk of  hypertension, coronary disease, cerebrovascular disease and diabetes.  I had  discussed with him the importance of diet, exercise and weight reduction as  well as the importance of alcohol and sedatives.  Driving precautions were  discussed with him as well until his sleep disorder was under adequate  control.  I reviewed various treatment options with him for his sleep apnea  including C-PAP therapy, oral appliance and surgical intervention.  Given  the severity of his sleep apnea in addition to his history of hypertension,  coronary disease and diabetes, I feel that his best option would be to  undergo C-PAP therapy.  Therefore, we will make arrangements for him to be  referred back to the sleep lab for a C-PAP titration study and initiate him  on C-PAP therapy.  With regards to his nasal septal deviation, I have  advised him on the use of nasal irrigation; however, if he is having  difficulty with tolerance of C-PAP, he may benefit from evaluation by ENT.  I would plan on following up with him in approximately eight weeks after he  has been initiated on C-PAP therapy to assess his tolerance compliance to his treatment for his sleep  apnea.  Coralyn Helling, MD   VS/MedQ  DD:  07/13/2006  DT:  07/15/2006  Job #:  [id number]   cc:   Armanda Magic, M.D.

## 2011-03-12 NOTE — Assessment & Plan Note (Signed)
[facility]                               PULMONARY OFFICE NOTE   NAME:CAUTHRENKamir, Selover                   MRN:          [id number]  DATE:08/31/2006                            DOB:          [birth year]    I saw Mr. Prime today after he had undergone his C-PAP titration study  which was done on August 20, 2006.  This showed that at a C-PAP pressure  setting of 9 cm/water his apnea/hypopnea index was reduced to 17.  However  the majority of events were actually central in nature and his obstructive  component actually improved considerably.  Again, he did have evidence for  central apneic events at higher pressure settings as well as periodic  breathing.  I had reviewed the results of his sleep study with Mr. Botts  and his wife.  I explained to them the possible implications of having  periodic breathing as well as central apnea, in addition to his severe  obstructive sleep apnea.  I had again emphasized the importance of diet,  exercise, weight reduction.  At this time, I will initiate him on C-PAP at 9  with heated humidification and follow up for his clinical response.  If he  is still having difficulties with his sleep pattern or symptoms of daytime  sleepiness, then consideration could be given to having him undergo  titration with Bi-PAP for an adaptive cerebral ventilator and I will make  arrangements for him to follow up with me in 6-8 weeks.     Coralyn Helling, MD  Electronically Signed    VS/MedQ  DD: 08/31/2006  DT: 08/31/2006  Job #: [id number]   cc:   Armanda Magic, M.D.

## 2011-03-12 NOTE — Procedures (Signed)
[name], [name] NO.:  [id number]   MEDICAL RECORD NO.:  [id number]          PATIENT TYPE:  OUT   LOCATION:  SLEEP CENTER                 FACILITY:  [facility]   PHYSICIAN:  Coralyn Helling, MD        DATE OF BIRTH:  [birth year]   DATE OF STUDY:  08/15/2006                              NOCTURNAL POLYSOMNOGRAM   INDICATIONS:  This is an individual who had undergone an overnight  polysomnogram on June 14, 2006 which showed an apnea hypopnea index of  35.6 and oxygen saturation of 88%.  He returns to the sleep lab for a CPAP  titration study.  Epworth score is 10.   MEDICATIONS:  Levothyroxine, Altace, omeprazole, aspirin, Lipitor, Zetia,  Caltrate, Lantus, metformin, glipizide, Metoprolol, Actos, and Benadryl.   SLEEP ARCHITECTURE:  Total recording time 413 minutes.  Total sleep time was  215.5 minutes.  Sleep efficiency 52%.  The study was notable for the lack of  slow wave sleep.  Sleep latency 38 minutes which is prolonged.  REM latency  74 minutes.  The patient was observed in both supine and nonsupine  positions.  Of note is that the patient appeared to have respiratory events  which appeared to contribute with difficulties with sleep initiation.   RESPIRATORY DATA:  Average respiratory rate was 16.  The patient was  titrated from a CPAP pressure setting of 5 to 9 cmH2O.  At a CPAP pressure  setting of 9 cmH2O, the apnea/hypotonia index was reduced to 17.  The  patient was observed in REM sleep and supine sleep at this pressure setting.  However, he appeared to have several central apneic events at this pressure  setting, in addition to having episodes of periodic breathing throughout  this study.  He had a limited amount of sleep time on CPAP of 9.   OXYGEN DATA:  The baseline oxygenation was 97%.  At a CPAP pressure setting  of 9 the mean oxygenation during non-REM sleep was 94.6 and during REM sleep  was 92.9.   CARDIAC DATA:  The baseline heart rate was  81 and rhythm strip showed normal  sinus rhythm with PVCs.   MOVEMENT/PARASOMNIA:  The periodic limb movement index was 54.6.  The  patient had one bathroom trip.   IMPRESSION/RECOMMENDATIONS:  This is a CPAP titration study at a CPAP  pressure setting of 9 cmH2O.  The apnea hypopnea index was reduced to 17.  The patient was observed in both REM sleep and supine sleep at this pressure  setting.  Snoring was eliminated.  Obstructive sleep apnea actually  improved.  Oxygenation stabilized at this pressure setting.  The patient did  appear to have difficulties with central apnea at sleep onset as well as  periodic breathing.  He had central apneic events even at the high pressure  settings of 9 cmH2O.  At the time I would recommend that the patient be  started on CPAP at 9 cmH2O and monitored for his clinical response.  If the  patient is still symptomatic, then consideration should be given to having  the patient either undergo therapy with bilevel  positive pressure or adaptive  servo-ventilator.  Additionally, clinical  correlation will be necessary to observe the significance of his periodic  limb movement index.      Coralyn Helling, MD  Diplomat, American Board of Sleep Medicine  Electronically Signed     VS/MEDQ  D:  08/23/2006 15:51:55  T:  08/24/2006 16:10:96  Job:  [id number]

## 2011-03-12 NOTE — Assessment & Plan Note (Signed)
[facility]                             PULMONARY OFFICE NOTE   NAME:CAUTHRENWaldo, [name]                   MRN:          [id number]  DATE:11/07/2006                            DOB:          15-Mar-1934    I saw [name] with his wife today for follow up of his obstructive  sleep apnea.   He is currently on CPAP at 9 cm of water with heated humidification.  His wife says that he is no longer snoring when he uses his CPAP  machine.  He has no difficulty falling asleep with the machine, and he  wakes up twice during the night to use the bathroom.  He says that when  he wakes up for the second time, between 3 and 4 o'clock in the morning,  he will not put his CPAP machine back on, and then he will sleep until 5  o'clock in the morning.  His wife says that she has noticed that he does  seem more energetic during the day, although he still occasionally dozes  off sometimes in the evening time if he is sitting idle.  She has also  noticed that for the hour or 2 that he is not wearing his CPAP machine  while asleep, his sleep seems somewhat more disturbed.  I questioned [name] as to why he does not wear the CPAP machine for the entire  night, he says he just does not feel like wearing it, but did not  specifically have any difficulties with the use of the mask or the  machine.  His medications list was reviewed.   PHYSICAL EXAMINATION:  There is no sinus tenderness, no nasal discharge,  nor lesions.  Heart was S1, S2.  Chest was clear to auscultation.  Abdomen was soft, nontender.   IMPRESSION:  Obstructive sleep apnea currently on CPAP of 9 cm of water.  He seems to be tolerating the CPAP machine reasonably well, and it  appears that he is gaining symptomatic benefit from it.  I have  encouraged him to use his CPAP machine for the entire time that he is  asleep.   I will follow up with him in approximately 4-6 months.     Coralyn Helling,  MD  Electronically Signed    VS/MedQ  DD: 11/08/2006  DT: 11/08/2006  Job #: [id number]   cc:   Armanda Magic, M.D.

## 2011-03-30 ENCOUNTER — Ambulatory Visit (HOSPITAL_COMMUNITY)
Admission: RE | Admit: 2011-03-30 | Discharge: 2011-03-30 | Disposition: A | Discharge status: Home / Self Care | Payer: Medicare Other | Source: Ambulatory Visit | Attending: Cardiology | Admitting: Cardiology

## 2011-03-30 DIAGNOSIS — Z79899 Other long term (current) drug therapy: Secondary | ICD-10-CM | POA: Insufficient documentation

## 2011-03-30 DIAGNOSIS — I4891 Unspecified atrial fibrillation: Secondary | ICD-10-CM | POA: Insufficient documentation

## 2011-04-22 ENCOUNTER — Encounter: Payer: Self-pay | Admitting: Pulmonary Disease

## 2011-06-16 ENCOUNTER — Encounter: Payer: Self-pay | Admitting: Internal Medicine

## 2011-07-30 LAB — GLUCOSE, CAPILLARY
Glucose-Capillary: 142 mg/dL — ABNORMAL HIGH (ref 70–99)
Glucose-Capillary: 225 mg/dL — ABNORMAL HIGH (ref 70–99)
Glucose-Capillary: 268 mg/dL — ABNORMAL HIGH (ref 70–99)

## 2011-08-03 LAB — I-STAT 8, (EC8 V) (CONVERTED LAB)
BUN: 12
Bicarbonate: 23.9
Chloride: 100
Glucose, Bld: 181 — ABNORMAL HIGH
HCT: 35 — ABNORMAL LOW
Hemoglobin: 11.9 — ABNORMAL LOW
Operator id: 294511
Potassium: 4
Sodium: 132 — ABNORMAL LOW
TCO2: 25
pCO2, Ven: 34 — ABNORMAL LOW
pH, Ven: 7.456 — ABNORMAL HIGH

## 2011-08-03 LAB — CBC
HCT: 27.3 — ABNORMAL LOW
HCT: 28.3 — ABNORMAL LOW
HCT: 29.8 — ABNORMAL LOW
HCT: 31.3 — ABNORMAL LOW
Hemoglobin: 10 — ABNORMAL LOW
Hemoglobin: 10.6 — ABNORMAL LOW
Hemoglobin: 9.2 — ABNORMAL LOW
Hemoglobin: 9.5 — ABNORMAL LOW
MCHC: 33.4
MCHC: 33.7
MCHC: 33.9
MCHC: 33.9
MCV: 82.8
MCV: 83.1
MCV: 83.4
MCV: 83.7
Platelets: 185
Platelets: 192
Platelets: 196
Platelets: 212
RBC: 3.26 — ABNORMAL LOW
RBC: 3.4 — ABNORMAL LOW
RBC: 3.57 — ABNORMAL LOW
RBC: 3.78 — ABNORMAL LOW
RDW: 15.2 — ABNORMAL HIGH
RDW: 15.3 — ABNORMAL HIGH
RDW: 15.6 — ABNORMAL HIGH
RDW: 15.7 — ABNORMAL HIGH
WBC: 11 — ABNORMAL HIGH
WBC: 11.7 — ABNORMAL HIGH
WBC: 8.4
WBC: 9

## 2011-08-03 LAB — TROPONIN I: Troponin I: 0.06

## 2011-08-03 LAB — CARDIAC PANEL(CRET KIN+CKTOT+MB+TROPI)
CK, MB: 1.6
CK, MB: 2.4
Relative Index: 2.2
Relative Index: INVALID
Total CK: 108
Total CK: 73
Troponin I: 0.07 — ABNORMAL HIGH
Troponin I: 0.07 — ABNORMAL HIGH

## 2011-08-03 LAB — POCT I-STAT CREATININE
Creatinine, Ser: 1
Operator id: 294511

## 2011-08-03 LAB — BASIC METABOLIC PANEL
BUN: 10
BUN: 12
CO2: 25
CO2: 27
Calcium: 8.1 — ABNORMAL LOW
Calcium: 8.4
Chloride: 101
Chloride: 99
Creatinine, Ser: 0.86
Creatinine, Ser: 0.87
GFR calc Af Amer: >60
GFR calc Af Amer: >60
GFR calc non Af Amer: >60
GFR calc non Af Amer: >60
Glucose, Bld: 153 — ABNORMAL HIGH
Glucose, Bld: 166 — ABNORMAL HIGH
Potassium: 3.6
Potassium: 4
Sodium: 133 — ABNORMAL LOW
Sodium: 135

## 2011-08-03 LAB — DIFFERENTIAL
Basophils Absolute: 0
Basophils Relative: 0
Eosinophils Absolute: 0
Eosinophils Relative: 0
Lymphocytes Relative: 10 — ABNORMAL LOW
Lymphs Abs: 1.1
Monocytes Absolute: 0.9 — ABNORMAL HIGH
Monocytes Relative: 8
Neutro Abs: 9.6 — ABNORMAL HIGH
Neutrophils Relative %: 82 — ABNORMAL HIGH

## 2011-08-03 LAB — CULTURE, BLOOD (ROUTINE X 2)
Culture: NO GROWTH
Culture: NO GROWTH

## 2011-08-03 LAB — CK TOTAL AND CKMB (NOT AT ARMC)
CK, MB: 1.1
Relative Index: INVALID
Total CK: 54

## 2011-08-10 ENCOUNTER — Encounter: Payer: Self-pay | Admitting: Internal Medicine

## 2011-09-01 ENCOUNTER — Encounter: Payer: Self-pay | Admitting: Pulmonary Disease

## 2011-09-02 ENCOUNTER — Ambulatory Visit (INDEPENDENT_AMBULATORY_CARE_PROVIDER_SITE_OTHER): Payer: Medicare Other | Admitting: Pulmonary Disease

## 2011-09-02 ENCOUNTER — Ambulatory Visit (INDEPENDENT_AMBULATORY_CARE_PROVIDER_SITE_OTHER): Payer: Medicare Other | Admitting: *Deleted

## 2011-09-02 ENCOUNTER — Encounter: Payer: Self-pay | Admitting: Pulmonary Disease

## 2011-09-02 ENCOUNTER — Ambulatory Visit (INDEPENDENT_AMBULATORY_CARE_PROVIDER_SITE_OTHER)
Admission: RE | Admit: 2011-09-02 | Discharge: 2011-09-02 | Disposition: A | Discharge status: Home / Self Care | Payer: Medicare Other | Source: Ambulatory Visit | Attending: Pulmonary Disease | Admitting: Pulmonary Disease

## 2011-09-02 VITALS — BP 128/70 | HR 83 | Temp 98.3°F | Ht 69.0 in | Wt 243.8 lb

## 2011-09-02 DIAGNOSIS — J438 Other emphysema: Secondary | ICD-10-CM

## 2011-09-02 DIAGNOSIS — G4733 Obstructive sleep apnea (adult) (pediatric): Secondary | ICD-10-CM

## 2011-09-02 DIAGNOSIS — I428 Other cardiomyopathies: Secondary | ICD-10-CM

## 2011-09-02 DIAGNOSIS — I5022 Chronic systolic (congestive) heart failure: Secondary | ICD-10-CM

## 2011-09-02 LAB — ICD DEVICE OBSERVATION
AL AMPLITUDE: 1.8586 mv
AL IMPEDENCE ICD: 560 Ohm
ATRIAL PACING ICD: 81.14 pct
BAMS-0001: 170 {beats}/min
BATTERY VOLTAGE: 3.04 V
BRDY-0002LV: 70 {beats}/min
BRDY-0003LV: 130 {beats}/min
BRDY-0004LV: 120 {beats}/min
CHARGE TIME: 11.07 s
FVT: 0
HV IMPEDENCE: 50 Ohm
LV LEAD IMPEDENCE ICD: 1024 Ohm
LV LEAD THRESHOLD: 1 V
PACEART VT: 0
RV LEAD AMPLITUDE: 9.8467 mv
RV LEAD IMPEDENCE ICD: 856 Ohm
TOT-0001: 1
TOT-0002: 0
TOT-0006: 20091215000000
TZAT-0001ATACH: 1
TZAT-0001ATACH: 2
TZAT-0001ATACH: 3
TZAT-0001FASTVT: 1
TZAT-0001SLOWVT: 1
TZAT-0001SLOWVT: 2
TZAT-0002ATACH: NEGATIVE
TZAT-0002ATACH: NEGATIVE
TZAT-0002ATACH: NEGATIVE
TZAT-0002FASTVT: NEGATIVE
TZAT-0004SLOWVT: 8
TZAT-0004SLOWVT: 8
TZAT-0005SLOWVT: 88 pct
TZAT-0005SLOWVT: 91 pct
TZAT-0011SLOWVT: 10 ms
TZAT-0011SLOWVT: 10 ms
TZAT-0012ATACH: 150 ms
TZAT-0012ATACH: 150 ms
TZAT-0012ATACH: 150 ms
TZAT-0012FASTVT: 200 ms
TZAT-0012SLOWVT: 200 ms
TZAT-0012SLOWVT: 200 ms
TZAT-0013SLOWVT: 2
TZAT-0013SLOWVT: 2
TZAT-0018ATACH: NEGATIVE
TZAT-0018ATACH: NEGATIVE
TZAT-0018ATACH: NEGATIVE
TZAT-0018FASTVT: NEGATIVE
TZAT-0018SLOWVT: NEGATIVE
TZAT-0018SLOWVT: NEGATIVE
TZAT-0019ATACH: 6 V
TZAT-0019ATACH: 6 V
TZAT-0019ATACH: 6 V
TZAT-0019FASTVT: 8 V
TZAT-0019SLOWVT: 8 V
TZAT-0019SLOWVT: 8 V
TZAT-0020ATACH: 1.5 ms
TZAT-0020ATACH: 1.5 ms
TZAT-0020ATACH: 1.5 ms
TZAT-0020FASTVT: 1.5 ms
TZAT-0020SLOWVT: 1.5 ms
TZAT-0020SLOWVT: 1.5 ms
TZON-0003ATACH: 350 ms
TZON-0003SLOWVT: 340 ms
TZON-0003VSLOWVT: 400 ms
TZON-0004SLOWVT: 16
TZON-0004VSLOWVT: 20
TZON-0005SLOWVT: 12
TZST-0001ATACH: 4
TZST-0001ATACH: 5
TZST-0001ATACH: 6
TZST-0001FASTVT: 2
TZST-0001FASTVT: 3
TZST-0001FASTVT: 4
TZST-0001FASTVT: 5
TZST-0001FASTVT: 6
TZST-0001SLOWVT: 3
TZST-0001SLOWVT: 4
TZST-0001SLOWVT: 5
TZST-0001SLOWVT: 6
TZST-0002ATACH: NEGATIVE
TZST-0002ATACH: NEGATIVE
TZST-0002ATACH: NEGATIVE
TZST-0002FASTVT: NEGATIVE
TZST-0002FASTVT: NEGATIVE
TZST-0002FASTVT: NEGATIVE
TZST-0002FASTVT: NEGATIVE
TZST-0002FASTVT: NEGATIVE
TZST-0003SLOWVT: 20 J
TZST-0003SLOWVT: 35 J
TZST-0003SLOWVT: 35 J
TZST-0003SLOWVT: 35 J
VENTRICULAR PACING ICD: 99.96 pct
VF: 0

## 2011-09-02 MED ORDER — TIOTROPIUM BROMIDE MONOHYDRATE 18 MCG IN CAPS: 18.0000 ug | ORAL_CAPSULE | Freq: Every day | RESPIRATORY_TRACT | Status: DC

## 2011-09-02 NOTE — Assessment & Plan Note (Signed)
He is compliant and reports benefit from therapy.

## 2011-09-02 NOTE — Progress Notes (Signed)
Chief Complaint  Patient presents with  . Sleep Apnea    Pt states he wears his cpap everynight x 8-9 hrs a night. pt denies any problems w/ machine/mask  . Emphysema    Pt states his breathing has been okay. Still occas SOB. Pt denies any cough    History of Present Illness: [name] is a 77 y.o. male former smoker with Emphysema, and OSA on CPAP 9 cm.  He has been doing well with CPAP.  He uses this on a regular basis.  He has a full face mask.  He is not having any trouble with his mask.  He feels CPAP helps his sleep and energy level.  He has noticed more trouble with his breathing with exertion.  He is having more cough with clear sputum.  He is also wheezing occasionally.  He has been using albuterol twice per day and this helps.  He denies chest pain, fever, hemoptysis, or leg swelling.  Past Medical History  Diagnosis Date  . Dyspnea   . OSA (obstructive sleep apnea)   . CAD (coronary artery disease)   . HTN (hypertension)   . DM (diabetes mellitus), type 2   . Hyperlipidemia   . Systolic congestive heart failure   . Atrial fibrillation   . Hypothyroidism   . PNA (pneumonia)   . Cleft palate   . Nasal septal deviation   . Iron deficiency anemia   . GERD (gastroesophageal reflux disease)   . Benign prostatic hypertrophy   . Nephrolithiasis   . Psoriasis   . Seborrheic keratosis   . COPD with emphysema 04/01/2010    Past Surgical History  Procedure Date  . Carpal tunnel release   . Coronary artery bypass graft   . Left cleft palate and left cleft lip repair   . Cataract extraction   . C-spine surgery   . Icd/bi-v insertion     Current Outpatient Prescriptions on File Prior to Visit  Medication Sig Dispense Refill  . albuterol (PROVENTIL HFA;VENTOLIN HFA) 108 (90 BASE) MCG/ACT inhaler Inhale 2 puffs into the lungs every 4 (four) hours as needed.        [name] albuterol (PROVENTIL) (2.5 MG/3ML) 0.083% nebulizer solution Take 2.5 mg by nebulization 4 (four)  times daily.        [name] aspirin 81 MG tablet Take 81 mg by mouth daily.        . Calcium Carbonate-Vitamin D (CALTRATE 600+D) 600-400 MG-UNIT per tablet Take 1 tablet by mouth 2 (two) times daily.        . carvedilol (COREG) 12.5 MG tablet Take 12.5 mg by mouth 2 (two) times daily with a meal.        . furosemide (LASIX) 40 MG tablet Take 40 mg by mouth 2 (two) times daily.        . insulin aspart (NOVOLOG) 100 UNIT/ML injection 20 units before breakfast and dinner, 10 units before lunch       . insulin glargine (LANTUS SOLOSTAR) 100 UNIT/ML injection Inject 55 Units into the skin at bedtime.        [name] levothyroxine (SYNTHROID, LEVOTHROID) 75 MCG tablet Take 75 mcg by mouth daily.        . Multiple Vitamin (MULTIVITAMIN) tablet Take 1 tablet by mouth daily.        [name] omeprazole (PRILOSEC) 20 MG capsule Take 20 mg by mouth daily.        . pravastatin (PRAVACHOL) 80 MG tablet Take 80  mg by mouth daily.        [name] spironolactone (ALDACTONE) 25 MG tablet 1/2 tablet once a day       . valsartan (DIOVAN) 80 MG tablet Take 80 mg by mouth daily.        [name] warfarin (COUMADIN) 5 MG tablet As directed per clinic         No Known Allergies  Physical Exam:  Blood pressure 128/70, pulse 83, temperature 98.3 F (36.8 C), temperature source Oral, height 5\' 9"  (1.753 m), weight 243 lb 12.8 oz (110.587 kg), SpO2 94.00%.  General - Obese HEENT - no sinus tenderness, no oral exudate, changes from cleft lip surgery, no LAN Cardiac - s1s2 no murmur Chest - decreased breath sounds, normal respiratory excursion, no wheeze/rales/dullness Abdomen - soft, non-tender Extremities - no e/c/c Skin - no rashes Neurologic - normal strength, CN intact Psychiatric - normal mood, behavior  Spirometry 09/02/11>>FEV1 1.34, FEV1% 66  Dg Chest 2 View  09/02/2011  *RADIOLOGY REPORT*  Clinical Data: Cough and shortness of breath  CHEST - 2 VIEW  Comparison: 02/10/2011  Findings: The defibrillator and postsurgical changes are  again noted.  Cardiac shadow is stable.  Chronic changes in the right lateral chest wall are noted.  Some plate-like atelectasis is seen in the left lung base.  No sizable infiltrate or effusion is noted. Mild hyperinflation is noted.  IMPRESSION: Chronic changes in the right lung base.  New left lung base plate-like atelectasis.  Original Report Authenticated By: Phillips Odor, M.D.    Assessment/Plan:  COPD with emphysema He has progression of his COPD.  Will start spiriva and continue prn albuterol.  OBSTRUCTIVE SLEEP APNEA He is compliant and reports benefit from therapy.     Outpatient Encounter Prescriptions as of 09/02/2011  Medication Sig Dispense Refill  . albuterol (PROVENTIL HFA;VENTOLIN HFA) 108 (90 BASE) MCG/ACT inhaler Inhale 2 puffs into the lungs every 4 (four) hours as needed.        [name] albuterol (PROVENTIL) (2.5 MG/3ML) 0.083% nebulizer solution Take 2.5 mg by nebulization 4 (four) times daily.        [name] amiodarone (PACERONE) 200 MG tablet Take 200 mg by mouth daily.        [name] aspirin 81 MG tablet Take 81 mg by mouth daily.        [name] atorvastatin (LIPITOR) 80 MG tablet Take 80 mg by mouth daily.        . Calcium Carbonate-Vitamin D (CALTRATE 600+D) 600-400 MG-UNIT per tablet Take 1 tablet by mouth 2 (two) times daily.        . carvedilol (COREG) 12.5 MG tablet Take 12.5 mg by mouth 2 (two) times daily with a meal.        . ferrous sulfate 325 (65 FE) MG EC tablet Take 325 mg by mouth 2 (two) times daily.        . furosemide (LASIX) 40 MG tablet Take 40 mg by mouth 2 (two) times daily.        . insulin aspart (NOVOLOG) 100 UNIT/ML injection 20 units before breakfast and dinner, 10 units before lunch       . insulin glargine (LANTUS SOLOSTAR) 100 UNIT/ML injection Inject 55 Units into the skin at bedtime.        [name] levothyroxine (SYNTHROID, LEVOTHROID) 75 MCG tablet Take 75 mcg by mouth daily.        . Multiple Vitamin (MULTIVITAMIN) tablet Take 1 tablet by mouth daily.          [name]  omeprazole (PRILOSEC) 20 MG capsule Take 20 mg by mouth daily.        . pravastatin (PRAVACHOL) 80 MG tablet Take 80 mg by mouth daily.        [name] spironolactone (ALDACTONE) 25 MG tablet 1/2 tablet once a day       . valsartan (DIOVAN) 80 MG tablet Take 80 mg by mouth daily.        [name] warfarin (COUMADIN) 5 MG tablet As directed per clinic       . tiotropium (SPIRIVA HANDIHALER) 18 MCG inhalation capsule Place 1 capsule (18 mcg total) into inhaler and inhale daily.  30 capsule  5    Ranen Doolin Pager:  [phone] 09/02/2011, 3:03 PM

## 2011-09-02 NOTE — Patient Instructions (Signed)
Spiriva one puff daily Follow up in 6 weeks

## 2011-09-02 NOTE — Assessment & Plan Note (Signed)
He has progression of his COPD.  Will start spiriva and continue prn albuterol.

## 2011-10-13 ENCOUNTER — Encounter: Payer: Self-pay | Admitting: Internal Medicine

## 2011-10-14 ENCOUNTER — Ambulatory Visit (INDEPENDENT_AMBULATORY_CARE_PROVIDER_SITE_OTHER): Payer: Medicare Other | Admitting: Pulmonary Disease

## 2011-10-14 ENCOUNTER — Encounter: Payer: Self-pay | Admitting: Pulmonary Disease

## 2011-10-14 DIAGNOSIS — J438 Other emphysema: Secondary | ICD-10-CM

## 2011-10-14 DIAGNOSIS — J439 Emphysema, unspecified: Secondary | ICD-10-CM

## 2011-10-14 DIAGNOSIS — G4733 Obstructive sleep apnea (adult) (pediatric): Secondary | ICD-10-CM

## 2011-10-14 MED ORDER — PREDNISONE (PAK) 10 MG PO TABS: ORAL_TABLET | ORAL | Status: DC

## 2011-10-14 MED ORDER — BUDESONIDE-FORMOTEROL FUMARATE 160-4.5 MCG/ACT IN AERO: 2.0000 | INHALATION_SPRAY | Freq: Two times a day (BID) | RESPIRATORY_TRACT | Status: DC

## 2011-10-14 MED ORDER — AEROCHAMBER MV MISC: Status: AC

## 2011-10-14 NOTE — Assessment & Plan Note (Signed)
He will check with his DME about getting his mask adjusted.

## 2011-10-14 NOTE — Patient Instructions (Signed)
Prednisone 10 mg pills: 3 pills for 2 days, 2 pills for 2 days, 1 pill for 2 days Stop spiriva Symbicort two puffs twice per day, and rinse mouth after each use Follow up in 3 months

## 2011-10-14 NOTE — Assessment & Plan Note (Signed)
He has persistent symptoms.  Will give him course of prednisone and change spiriva to symbicort.  He is to continue proair as needed.  Will give him a spacer device also.

## 2011-10-14 NOTE — Progress Notes (Signed)
Chief Complaint  Patient presents with  . Emphysema    PT STATES INCREASE SOB,UPON ACTIVITY,WHEEZING,PRODUCTIVE COUGH.    History of Present Illness: [name] is a 77 y.o. male 77 y.o. male former smoker with COPD/Emphysema, and OSA on CPAP 9 cm.  He still has cough, wheeze, and chest congestion.  He brings up clear sputum.  He does not feel like spiriva has helped much.  He uses his proair once or twice per day.   He has been getting some irritation from his CPAP mask around the side of his face.  Otherwise he is doing well with CPAP.  Past Medical History  Diagnosis Date  . Dyspnea   . OSA (obstructive sleep apnea)   . CAD (coronary artery disease)   . HTN (hypertension)   . DM (diabetes mellitus), type 2   . Hyperlipidemia   . Systolic congestive heart failure   . Atrial fibrillation   . Hypothyroidism   . PNA (pneumonia)   . Cleft palate   . Nasal septal deviation   . Iron deficiency anemia   . GERD (gastroesophageal reflux disease)   . Benign prostatic hypertrophy   . Nephrolithiasis   . Psoriasis   . Seborrheic keratosis   . COPD with emphysema 04/01/2010    Past Surgical History  Procedure Date  . Carpal tunnel release   . Coronary artery bypass graft   . Left cleft palate and left cleft lip repair   . Cataract extraction   . C-spine surgery   . Icd/bi-v insertion     No Known Allergies  Physical Exam:  Blood pressure 112/78, pulse 78, temperature 97.7 F (36.5 C), temperature source Oral, height 5\' 7"  (1.702 m), weight 245 lb 12.8 oz (111.494 kg), SpO2 92.00%. Body mass index is 38.50 kg/(m^2). Wt Readings from Last 2 Encounters:  10/14/11 245 lb 12.8 oz (111.494 kg)  09/02/11 243 lb 12.8 oz (110.587 kg)   General - Obese  HEENT - no sinus tenderness, no oral exudate, changes from cleft lip surgery, no LAN  Cardiac - s1s2 no murmur  Chest - decreased breath sounds, normal respiratory excursion, b/l expiratory wheeze with partial clearing  after cough, no rales/dullness  Abdomen - soft, non-tender  Extremities - no e/c/c  Skin - no rashes  Neurologic - normal strength, CN intact  Psychiatric - normal mood, behavior   Assessment/Plan:  Outpatient Encounter Prescriptions as of 10/14/2011  Medication Sig Dispense Refill  . albuterol (PROVENTIL HFA;VENTOLIN HFA) 108 (90 BASE) MCG/ACT inhaler Inhale 2 puffs into the lungs every 4 (four) hours as needed.        [name] albuterol (PROVENTIL) (2.5 MG/3ML) 0.083% nebulizer solution Take 2.5 mg by nebulization 4 (four) times daily.        [name] amiodarone (PACERONE) 200 MG tablet Take 200 mg by mouth daily.        [name] aspirin 81 MG tablet Take 81 mg by mouth daily.        [name] atorvastatin (LIPITOR) 80 MG tablet Take 80 mg by mouth daily.        . Calcium Carbonate-Vitamin D (CALTRATE 600+D) 600-400 MG-UNIT per tablet Take 1 tablet by mouth 2 (two) times daily.        . carvedilol (COREG) 12.5 MG tablet Take 12.5 mg by mouth 2 (two) times daily with a meal.        . ferrous sulfate 325 (65 FE) MG EC tablet Take 325 mg by mouth 2 (two) times  daily.        . furosemide (LASIX) 40 MG tablet Take 40 mg by mouth 2 (two) times daily.        . insulin aspart (NOVOLOG) 100 UNIT/ML injection 20 units before breakfast and dinner, 10 units before lunch       . insulin glargine (LANTUS SOLOSTAR) 100 UNIT/ML injection Inject 55 Units into the skin at bedtime.        [name] levothyroxine (SYNTHROID, LEVOTHROID) 75 MCG tablet Take 75 mcg by mouth daily.        . Multiple Vitamin (MULTIVITAMIN) tablet Take 1 tablet by mouth daily.        [name] omeprazole (PRILOSEC) 20 MG capsule Take 20 mg by mouth daily.        [name] spironolactone (ALDACTONE) 25 MG tablet 1/2 tablet once a day       . tiotropium (SPIRIVA HANDIHALER) 18 MCG inhalation capsule Place 1 capsule (18 mcg total) into inhaler and inhale daily.  30 capsule  5  . valsartan (DIOVAN) 80 MG tablet Take 80 mg by mouth daily.        [name] warfarin (COUMADIN) 5 MG tablet As  directed per clinic       . DISCONTD: pravastatin (PRAVACHOL) 80 MG tablet Take 80 mg by mouth daily.          Deborah Dondero Pager:  [phone] 10/14/2011, 3:02 PM

## 2011-11-05 DIAGNOSIS — Z85828 Personal history of other malignant neoplasm of skin: Secondary | ICD-10-CM | POA: Diagnosis not present

## 2011-11-05 DIAGNOSIS — L905 Scar conditions and fibrosis of skin: Secondary | ICD-10-CM | POA: Diagnosis not present

## 2011-11-05 DIAGNOSIS — L570 Actinic keratosis: Secondary | ICD-10-CM | POA: Diagnosis not present

## 2011-11-11 DIAGNOSIS — K219 Gastro-esophageal reflux disease without esophagitis: Secondary | ICD-10-CM | POA: Diagnosis not present

## 2011-11-11 DIAGNOSIS — J441 Chronic obstructive pulmonary disease with (acute) exacerbation: Secondary | ICD-10-CM | POA: Diagnosis not present

## 2011-11-11 DIAGNOSIS — E7800 Pure hypercholesterolemia, unspecified: Secondary | ICD-10-CM | POA: Diagnosis not present

## 2011-11-11 DIAGNOSIS — D509 Iron deficiency anemia, unspecified: Secondary | ICD-10-CM | POA: Diagnosis not present

## 2011-11-11 DIAGNOSIS — E039 Hypothyroidism, unspecified: Secondary | ICD-10-CM | POA: Diagnosis not present

## 2011-11-11 DIAGNOSIS — Z7901 Long term (current) use of anticoagulants: Secondary | ICD-10-CM | POA: Diagnosis not present

## 2011-11-11 DIAGNOSIS — Z79899 Other long term (current) drug therapy: Secondary | ICD-10-CM | POA: Diagnosis not present

## 2011-11-11 DIAGNOSIS — I10 Essential (primary) hypertension: Secondary | ICD-10-CM | POA: Diagnosis not present

## 2011-11-11 DIAGNOSIS — E119 Type 2 diabetes mellitus without complications: Secondary | ICD-10-CM | POA: Diagnosis not present

## 2011-11-18 ENCOUNTER — Telehealth: Payer: Self-pay | Admitting: Pulmonary Disease

## 2011-11-18 ENCOUNTER — Other Ambulatory Visit: Payer: Self-pay | Admitting: *Deleted

## 2011-11-18 MED ORDER — ALBUTEROL SULFATE (2.5 MG/3ML) 0.083% IN NEBU: 2.5000 mg | INHALATION_SOLUTION | Freq: Four times a day (QID) | RESPIRATORY_TRACT | Status: DC

## 2011-11-18 NOTE — Telephone Encounter (Signed)
Refill sent today. Pt aware. Carron Curie, CMA

## 2011-12-17 ENCOUNTER — Encounter: Payer: Self-pay | Admitting: Internal Medicine

## 2011-12-17 ENCOUNTER — Ambulatory Visit (INDEPENDENT_AMBULATORY_CARE_PROVIDER_SITE_OTHER): Payer: Medicare Other | Admitting: Internal Medicine

## 2011-12-17 DIAGNOSIS — Z95810 Presence of automatic (implantable) cardiac defibrillator: Secondary | ICD-10-CM

## 2011-12-17 DIAGNOSIS — I2589 Other forms of chronic ischemic heart disease: Secondary | ICD-10-CM

## 2011-12-17 DIAGNOSIS — I5022 Chronic systolic (congestive) heart failure: Secondary | ICD-10-CM

## 2011-12-17 DIAGNOSIS — I4891 Unspecified atrial fibrillation: Secondary | ICD-10-CM | POA: Diagnosis not present

## 2011-12-17 LAB — ICD DEVICE OBSERVATION
AL AMPLITUDE: 1.6 mv
AL IMPEDENCE ICD: 536 Ohm
AL THRESHOLD: 1 V
ATRIAL PACING ICD: 93 pct
BAMS-0001: 170 {beats}/min
BATTERY VOLTAGE: 3 V
LV LEAD IMPEDENCE ICD: 984 Ohm
LV LEAD THRESHOLD: 1 V
RV LEAD AMPLITUDE: 11.9 mv
RV LEAD IMPEDENCE ICD: 776 Ohm
RV LEAD THRESHOLD: 1.5 V
TZAT-0001ATACH: 1
TZAT-0001ATACH: 2
TZAT-0001ATACH: 3
TZAT-0001FASTVT: 1
TZAT-0001SLOWVT: 1
TZAT-0001SLOWVT: 2
TZAT-0002ATACH: NEGATIVE
TZAT-0002ATACH: NEGATIVE
TZAT-0002ATACH: NEGATIVE
TZAT-0002FASTVT: NEGATIVE
TZAT-0004SLOWVT: 8
TZAT-0004SLOWVT: 8
TZAT-0005SLOWVT: 88 pct
TZAT-0005SLOWVT: 91 pct
TZAT-0011SLOWVT: 10 ms
TZAT-0011SLOWVT: 10 ms
TZAT-0012ATACH: 150 ms
TZAT-0012ATACH: 150 ms
TZAT-0012ATACH: 150 ms
TZAT-0012FASTVT: 200 ms
TZAT-0012SLOWVT: 200 ms
TZAT-0012SLOWVT: 200 ms
TZAT-0013SLOWVT: 2
TZAT-0013SLOWVT: 2
TZAT-0018ATACH: NEGATIVE
TZAT-0018ATACH: NEGATIVE
TZAT-0018ATACH: NEGATIVE
TZAT-0018FASTVT: NEGATIVE
TZAT-0018SLOWVT: NEGATIVE
TZAT-0018SLOWVT: NEGATIVE
TZAT-0019ATACH: 6 V
TZAT-0019ATACH: 6 V
TZAT-0019ATACH: 6 V
TZAT-0019FASTVT: 8 V
TZAT-0019SLOWVT: 8 V
TZAT-0019SLOWVT: 8 V
TZAT-0020ATACH: 1.5 ms
TZAT-0020ATACH: 1.5 ms
TZAT-0020ATACH: 1.5 ms
TZAT-0020FASTVT: 1.5 ms
TZAT-0020SLOWVT: 1.5 ms
TZAT-0020SLOWVT: 1.5 ms
TZON-0003ATACH: 350 ms
TZON-0003SLOWVT: 340 ms
TZON-0003VSLOWVT: 400 ms
TZON-0004SLOWVT: 16
TZON-0004VSLOWVT: 20
TZON-0005SLOWVT: 12
TZST-0001ATACH: 4
TZST-0001ATACH: 5
TZST-0001ATACH: 6
TZST-0001FASTVT: 2
TZST-0001FASTVT: 3
TZST-0001FASTVT: 4
TZST-0001FASTVT: 5
TZST-0001FASTVT: 6
TZST-0001SLOWVT: 3
TZST-0001SLOWVT: 4
TZST-0001SLOWVT: 5
TZST-0001SLOWVT: 6
TZST-0002ATACH: NEGATIVE
TZST-0002ATACH: NEGATIVE
TZST-0002ATACH: NEGATIVE
TZST-0002FASTVT: NEGATIVE
TZST-0002FASTVT: NEGATIVE
TZST-0002FASTVT: NEGATIVE
TZST-0002FASTVT: NEGATIVE
TZST-0002FASTVT: NEGATIVE
TZST-0003SLOWVT: 20 J
TZST-0003SLOWVT: 35 J
TZST-0003SLOWVT: 35 J
TZST-0003SLOWVT: 35 J
VENTRICULAR PACING ICD: 99.9 pct

## 2011-12-17 NOTE — Progress Notes (Signed)
[name] Mountain, MD, MD: Primary Cardiologist:  Dr Dell Ponto is a 78 y.o. male with a h/o CAD, chronic systolic dysfunction and persistent afib sp BiV ICD (MDT) by Dr Amil Amen who presents today to establish care in the Electrophysiology device clinic.   The patient reports doing very well since having a BiV ICD implanted and remains reasonably active despite his age.   He previously has been placed on amiodarone by [facility] for afib.  Today, he  denies symptoms of palpitations, chest pain, shortness of breath, orthopnea, PND, lower extremity edema, dizziness, presyncope, syncope, or neurologic sequela.  The patientis tolerating medications without difficulties and is otherwise without complaint today.   Past Medical History  Diagnosis Date  . Dyspnea   . OSA (obstructive sleep apnea)   . CAD (coronary artery disease)   . HTN (hypertension)   . DM (diabetes mellitus), type 2   . Hyperlipidemia   . Systolic congestive heart failure 2009    s/p BiV ICD implantation by Dr Amil Amen (MDT)  . Atrial fibrillation     persistent, previously seen at [facility] and placed on amiodarone  . Hypothyroidism   . PNA (pneumonia)   . Cleft palate   . Nasal septal deviation   . Iron deficiency anemia   . GERD (gastroesophageal reflux disease)   . Benign prostatic hypertrophy   . Nephrolithiasis   . Psoriasis   . Seborrheic keratosis   . COPD with emphysema 04/01/2010   Past Surgical History  Procedure Date  . Carpal tunnel release   . Coronary artery bypass graft   . Left cleft palate and left cleft lip repair   . Cataract extraction   . C-spine surgery   . Icd/bi-v insertion 10-08-08    Dr Amil Amen (MDT) implant for primary prevention    History   Social History  . Marital Status: Married    Spouse Name: N/A    Number of Children: N/A  . Years of Education: N/A   Occupational History  . retired     Academic librarian   Social History Main Topics  . Smoking status: Former Smoker  -- 3.0 packs/day for 65 years    Types: Cigarettes    Quit date: 10/25/1998  . Smokeless tobacco: Not on file  . Alcohol Use: No  . Drug Use: No  . Sexually Active: Not on file   Other Topics Concern  . Not on file   Social History Narrative   Lives [place]    Family History  Problem Relation Age of Onset  . Asthma Father   . Heart disease Brother   . Stroke Father     No Known Allergies  Current Outpatient Prescriptions  Medication Sig Dispense Refill  . albuterol (PROVENTIL HFA;VENTOLIN HFA) 108 (90 BASE) MCG/ACT inhaler Inhale 2 puffs into the lungs every 4 (four) hours as needed.        [name] albuterol (PROVENTIL) (2.5 MG/3ML) 0.083% nebulizer solution Take 3 mLs (2.5 mg total) by nebulization 4 (four) times daily.  360 mL  1  . amiodarone (PACERONE) 200 MG tablet Take 200 mg by mouth daily.        [name] aspirin 81 MG tablet Take 81 mg by mouth daily.        [name] atorvastatin (LIPITOR) 80 MG tablet Take 80 mg by mouth daily.        . budesonide-formoterol (SYMBICORT) 160-4.5 MCG/ACT inhaler Inhale 2 puffs into the lungs 2 (two) times daily.  1  Inhaler  12  . carvedilol (COREG) 12.5 MG tablet Take 12.5 mg by mouth 2 (two) times daily with a meal.        . furosemide (LASIX) 40 MG tablet Take 40 mg by mouth 2 (two) times daily.        . insulin aspart (NOVOLOG) 100 UNIT/ML injection 20 units before breakfast and dinner, 10 units before lunch       . insulin glargine (LANTUS SOLOSTAR) 100 UNIT/ML injection Inject 55 Units into the skin at bedtime.        [name] levothyroxine (SYNTHROID, LEVOTHROID) 75 MCG tablet Take 75 mcg by mouth daily.        . Multiple Vitamin (MULTIVITAMIN) tablet Take 1 tablet by mouth daily.        [name] omeprazole (PRILOSEC) 20 MG capsule Take 20 mg by mouth daily.        [name] Spacer/Aero-Holding Chambers (AEROCHAMBER MV) inhaler Use as instructed  1 each  0  . spironolactone (ALDACTONE) 25 MG tablet 1/2 tablet once a day       . valsartan (DIOVAN) 80 MG tablet  Take 80 mg by mouth daily.        [name] warfarin (COUMADIN) 5 MG tablet As directed per clinic         ROS- all systems are reviewed and negative except as per HPI  Physical Exam: Filed Vitals:   12/17/11 0902  BP: 130/80  Pulse: 76  Height: 5\' 10"  (1.778 m)  Weight: 244 lb (110.678 kg)    GEN- The patient is well appearing, alert and oriented x 3 today.   Head- normocephalic, atraumatic Eyes-  Sclera clear, conjunctiva pink Ears- hearing intact Oropharynx- clear Neck- supple, no JVP Lungs- Clear to ausculation bilaterally, normal work of breathing Chest- BiV ICD pocket is well healed Heart- Regular rate and rhythm, no murmurs, rubs or gallops, PMI not laterally displaced GI- soft, NT, ND, + BS Extremities- no clubbing, cyanosis, 1+ edema MS- no significant deformity or atrophy Skin- no rash or lesion Psych- euthymic mood, full affect Neuro- strength and sensation are intact  ICD interrogation- reviewed in detail today,  See PACEART report  Assessment and Plan:

## 2011-12-17 NOTE — Patient Instructions (Addendum)
Remote monitoring is used to monitor your Pacemaker of ICD from home. This monitoring reduces the number of office visits required to check your device to one time per year. It allows us to keep an eye on the functioning of your device to ensure it is working properly. You are scheduled for a device check from home on Mar 16, 2012. You may send your transmission at any time that day. If you have a wireless device, the transmission will be sent automatically. After your physician reviews your transmission, you will receive a postcard with your next transmission date.   Your physician wants you to follow-up in: 12 months with Dr Allred You will receive a reminder letter in the mail two months in advance. If you don't receive a letter, please call our office to schedule the follow-up appointment.   

## 2011-12-17 NOTE — Assessment & Plan Note (Signed)
Stable No change required today  

## 2011-12-17 NOTE — Assessment & Plan Note (Signed)
No ischemic symptoms CHF is controlled  Normal BiV ICD function See Pace Art report NIDs increased to minimize ICD shocks  carelink every 3 months Return in 12 months

## 2011-12-17 NOTE — Assessment & Plan Note (Signed)
Maintaining sinus rhythm with amiodarone Dr Mayford Knife to follow LFTs, TFTs,  Dr Craige Cotta to follow PFTs Goal INR 2-3

## 2012-01-05 DIAGNOSIS — J441 Chronic obstructive pulmonary disease with (acute) exacerbation: Secondary | ICD-10-CM | POA: Diagnosis not present

## 2012-01-05 DIAGNOSIS — B309 Viral conjunctivitis, unspecified: Secondary | ICD-10-CM | POA: Diagnosis not present

## 2012-01-12 ENCOUNTER — Encounter: Payer: Self-pay | Admitting: Pulmonary Disease

## 2012-01-12 ENCOUNTER — Ambulatory Visit (INDEPENDENT_AMBULATORY_CARE_PROVIDER_SITE_OTHER): Payer: Medicare Other | Admitting: Pulmonary Disease

## 2012-01-12 VITALS — BP 118/70 | HR 75 | Temp 97.6°F | Ht 67.0 in | Wt 245.2 lb

## 2012-01-12 DIAGNOSIS — J439 Emphysema, unspecified: Secondary | ICD-10-CM

## 2012-01-12 DIAGNOSIS — G4733 Obstructive sleep apnea (adult) (pediatric): Secondary | ICD-10-CM | POA: Diagnosis not present

## 2012-01-12 DIAGNOSIS — J438 Other emphysema: Secondary | ICD-10-CM

## 2012-01-12 DIAGNOSIS — I4891 Unspecified atrial fibrillation: Secondary | ICD-10-CM

## 2012-01-12 MED ORDER — MONTELUKAST SODIUM 10 MG PO TABS: 10.0000 mg | ORAL_TABLET | Freq: Every day | ORAL | Status: DC

## 2012-01-12 NOTE — Assessment & Plan Note (Signed)
He will need follow up PFT's in June 2013 while he is on amiodarone.

## 2012-01-12 NOTE — Assessment & Plan Note (Signed)
He has improved after therapy for recent exacerbation.  He may have an asthmatic component to his breathing problems made worse by post-nasal drip.  Will try him on singulair in addition to continuing symbicort and prn albuterol.  He is to finish his course of prednisone and avelox.  He may need evaluation by ENT for nasal septal deviation contributing to post-nasal drip.

## 2012-01-12 NOTE — Progress Notes (Signed)
Chief Complaint  Patient presents with  . Follow-up    Pt states his breathing has been okay. c/o some wheezing. Pt was dx w/ viral conjunctivitis last week by Dr. Valentina Lucks    History of Present Illness: [name] is a 78 y.o. male 78 y.o. male former smoker with COPD/Emphysema, post-nasal drip, A fib on amiodarone and OSA on CPAP 9 cm.  He got more cough, wheeze, and yellow sputum one week ago.  He was started on avelox and prednisone by PCP.  He is feeling better.  He still has nasal congestion with post nasal drip.  He feels his cough and wheeze is worse in the morning.  He got a new CPAP mask, and this is working better.    Past Medical History  Diagnosis Date  . Dyspnea   . OSA (obstructive sleep apnea)   . CAD (coronary artery disease)   . HTN (hypertension)   . DM (diabetes mellitus), type 2   . Hyperlipidemia   . Systolic congestive heart failure 2009    s/p BiV ICD implantation by Dr Amil Amen (MDT)  . Atrial fibrillation     persistent, previously seen at [facility] and placed on amiodarone  . Hypothyroidism   . PNA (pneumonia)   . Cleft palate   . Nasal septal deviation   . Iron deficiency anemia   . GERD (gastroesophageal reflux disease)   . Benign prostatic hypertrophy   . Nephrolithiasis   . Psoriasis   . Seborrheic keratosis   . COPD with emphysema 04/01/2010    Past Surgical History  Procedure Date  . Carpal tunnel release   . Coronary artery bypass graft   . Left cleft palate and left cleft lip repair   . Cataract extraction   . C-spine surgery   . Icd/bi-v insertion 10-08-08    Dr Amil Amen (MDT) implant for primary prevention    No Known Allergies  Physical Exam:  Blood pressure 118/70, pulse 75, temperature 97.6 F (36.4 C), temperature source Oral, height 5\' 7"  (1.702 m), weight 245 lb 3.2 oz (111.222 kg), SpO2 94.00%. Body mass index is 38.40 kg/(m^2).  Wt Readings from Last 2 Encounters:  01/12/12 245 lb 3.2 oz (111.222 kg)  12/17/11  244 lb (110.678 kg)    General - Obese  HEENT - no sinus tenderness, no oral exudate, changes from cleft lip surgery, no LAN, nasal septal deviation Cardiac - s1s2 no murmur  Chest - decreased breath sounds, normal respiratory excursion, no wheeze/rales/dullness  Abdomen - soft, non-tender  Extremities - no e/c/c  Skin - no rashes  Neurologic - normal strength, CN intact  Psychiatric - normal mood, behavior   Assessment/Plan:  Outpatient Encounter Prescriptions as of 01/12/2012  Medication Sig Dispense Refill  . albuterol (PROVENTIL HFA;VENTOLIN HFA) 108 (90 BASE) MCG/ACT inhaler Inhale 2 puffs into the lungs every 4 (four) hours as needed.        [name] albuterol (PROVENTIL) (2.5 MG/3ML) 0.083% nebulizer solution Take 3 mLs (2.5 mg total) by nebulization 4 (four) times daily.  360 mL  1  . amiodarone (PACERONE) 200 MG tablet Take 200 mg by mouth daily.        [name] aspirin 81 MG tablet Take 81 mg by mouth daily.        [name] atorvastatin (LIPITOR) 80 MG tablet Take 80 mg by mouth daily.        . budesonide-formoterol (SYMBICORT) 160-4.5 MCG/ACT inhaler Inhale 2 puffs into the lungs 2 (two) times daily.  1 Inhaler  12  . carvedilol (COREG) 12.5 MG tablet Take 12.5 mg by mouth 2 (two) times daily with a meal.        . furosemide (LASIX) 40 MG tablet Take 40 mg by mouth 2 (two) times daily.        . insulin aspart (NOVOLOG) 100 UNIT/ML injection 23 units before breakfast and dinner, 15 units before lunch      . insulin glargine (LANTUS SOLOSTAR) 100 UNIT/ML injection Inject 90 Units into the skin at bedtime.       [name] levothyroxine (SYNTHROID, LEVOTHROID) 75 MCG tablet Take 75 mcg by mouth daily.        . Multiple Vitamin (MULTIVITAMIN) tablet Take 1 tablet by mouth daily.        [name] omeprazole (PRILOSEC) 20 MG capsule Take 20 mg by mouth daily.        . predniSONE (DELTASONE) 20 MG tablet As directed      . Spacer/Aero-Holding Chambers (AEROCHAMBER MV) inhaler Use as instructed  1 each  0  .  spironolactone (ALDACTONE) 25 MG tablet 1/2 tablet once a day       . valsartan (DIOVAN) 80 MG tablet Take 80 mg by mouth daily.        [name] warfarin (COUMADIN) 5 MG tablet As directed per clinic         Timika Muench Pager:  [phone] 01/12/2012, 1:44 PM

## 2012-01-12 NOTE — Patient Instructions (Signed)
Singulair 10 mg one pill at night Symbicort two puffs twice per day Albuterol nebulized 3 to 4 times per day as needed for cough, wheeze, or chest congestion Follow up in 2 months

## 2012-01-12 NOTE — Assessment & Plan Note (Signed)
He has done better since his mask was changed.

## 2012-02-03 DIAGNOSIS — Z7901 Long term (current) use of anticoagulants: Secondary | ICD-10-CM | POA: Diagnosis not present

## 2012-02-03 DIAGNOSIS — I4891 Unspecified atrial fibrillation: Secondary | ICD-10-CM | POA: Diagnosis not present

## 2012-02-08 DIAGNOSIS — K219 Gastro-esophageal reflux disease without esophagitis: Secondary | ICD-10-CM | POA: Diagnosis not present

## 2012-02-08 DIAGNOSIS — IMO0001 Reserved for inherently not codable concepts without codable children: Secondary | ICD-10-CM | POA: Diagnosis not present

## 2012-02-08 DIAGNOSIS — J449 Chronic obstructive pulmonary disease, unspecified: Secondary | ICD-10-CM | POA: Diagnosis not present

## 2012-02-08 DIAGNOSIS — I10 Essential (primary) hypertension: Secondary | ICD-10-CM | POA: Diagnosis not present

## 2012-02-11 ENCOUNTER — Other Ambulatory Visit: Payer: Self-pay | Admitting: Pulmonary Disease

## 2012-02-15 DIAGNOSIS — I4891 Unspecified atrial fibrillation: Secondary | ICD-10-CM | POA: Diagnosis not present

## 2012-02-15 DIAGNOSIS — Z7901 Long term (current) use of anticoagulants: Secondary | ICD-10-CM | POA: Diagnosis not present

## 2012-02-29 DIAGNOSIS — H35369 Drusen (degenerative) of macula, unspecified eye: Secondary | ICD-10-CM | POA: Diagnosis not present

## 2012-02-29 DIAGNOSIS — H26499 Other secondary cataract, unspecified eye: Secondary | ICD-10-CM | POA: Diagnosis not present

## 2012-02-29 DIAGNOSIS — I4891 Unspecified atrial fibrillation: Secondary | ICD-10-CM | POA: Diagnosis not present

## 2012-02-29 DIAGNOSIS — Z961 Presence of intraocular lens: Secondary | ICD-10-CM | POA: Diagnosis not present

## 2012-02-29 DIAGNOSIS — M1710 Unilateral primary osteoarthritis, unspecified knee: Secondary | ICD-10-CM | POA: Diagnosis not present

## 2012-02-29 DIAGNOSIS — Z7901 Long term (current) use of anticoagulants: Secondary | ICD-10-CM | POA: Diagnosis not present

## 2012-02-29 DIAGNOSIS — H0230 Blepharochalasis unspecified eye, unspecified eyelid: Secondary | ICD-10-CM | POA: Diagnosis not present

## 2012-03-16 ENCOUNTER — Encounter: Payer: Medicare Other | Admitting: *Deleted

## 2012-03-17 ENCOUNTER — Ambulatory Visit (INDEPENDENT_AMBULATORY_CARE_PROVIDER_SITE_OTHER): Payer: Medicare Other | Admitting: *Deleted

## 2012-03-17 DIAGNOSIS — I5022 Chronic systolic (congestive) heart failure: Secondary | ICD-10-CM

## 2012-03-17 DIAGNOSIS — Z95810 Presence of automatic (implantable) cardiac defibrillator: Secondary | ICD-10-CM | POA: Diagnosis not present

## 2012-03-18 ENCOUNTER — Encounter: Payer: Self-pay | Admitting: Internal Medicine

## 2012-04-05 ENCOUNTER — Encounter: Payer: Self-pay | Admitting: Pulmonary Disease

## 2012-04-05 ENCOUNTER — Ambulatory Visit (INDEPENDENT_AMBULATORY_CARE_PROVIDER_SITE_OTHER): Payer: Medicare Other | Admitting: Pulmonary Disease

## 2012-04-05 VITALS — BP 124/70 | HR 74 | Temp 97.5°F | Ht 67.0 in | Wt 251.6 lb

## 2012-04-05 DIAGNOSIS — J439 Emphysema, unspecified: Secondary | ICD-10-CM

## 2012-04-05 DIAGNOSIS — J438 Other emphysema: Secondary | ICD-10-CM | POA: Diagnosis not present

## 2012-04-05 DIAGNOSIS — I4891 Unspecified atrial fibrillation: Secondary | ICD-10-CM | POA: Diagnosis not present

## 2012-04-05 DIAGNOSIS — J4489 Other specified chronic obstructive pulmonary disease: Secondary | ICD-10-CM

## 2012-04-05 DIAGNOSIS — G4733 Obstructive sleep apnea (adult) (pediatric): Secondary | ICD-10-CM

## 2012-04-05 DIAGNOSIS — J449 Chronic obstructive pulmonary disease, unspecified: Secondary | ICD-10-CM

## 2012-04-05 NOTE — Patient Instructions (Signed)
Will schedule breathing test (PFT) and call with results Follow up in 6 months 

## 2012-04-05 NOTE — Assessment & Plan Note (Signed)
He has done better since his mask was changed.

## 2012-04-05 NOTE — Assessment & Plan Note (Signed)
Will repeat PFTs since he is on amiodarone.

## 2012-04-05 NOTE — Assessment & Plan Note (Signed)
He has noticed more trouble with his breathing.  This may be related to improper inhaler technique.  Have discussed proper use of his inhalers.

## 2012-04-05 NOTE — Progress Notes (Signed)
Chief Complaint  Patient presents with  . COPD    Pt states having an increase SOB< wheezing upon activity . Productive cough  (thick white). deneis any fever     History of Present Illness: [name] is a 78 y.o. male 78 y.o. male former smoker with COPD/Emphysema, post-nasal drip, A fib on amiodarone and OSA on CPAP 9 cm.  He has noticed more cough with clear to yellow sputum.  He denies fever or hemoptysis.  He has wheeze in his chest and throat.  He has chronic sinus congestion.  He uses albuterol, and this helps.  He forgets to use symbicort all the time.  He is not sure he is using his inhaler correctly.  He does have an aerochamber.  He got a new CPAP mask, and this is working better.    Past Medical History  Diagnosis Date  . Dyspnea   . OSA (obstructive sleep apnea)   . CAD (coronary artery disease)   . HTN (hypertension)   . DM (diabetes mellitus), type 2   . Hyperlipidemia   . Systolic congestive heart failure 2009    s/p BiV ICD implantation by Dr Amil Amen (MDT)  . Atrial fibrillation     persistent, previously seen at [facility] and placed on amiodarone  . Hypothyroidism   . PNA (pneumonia)   . Cleft palate   . Nasal septal deviation   . Iron deficiency anemia   . GERD (gastroesophageal reflux disease)   . Benign prostatic hypertrophy   . Nephrolithiasis   . Psoriasis   . Seborrheic keratosis   . COPD with emphysema 04/01/2010    Past Surgical History  Procedure Date  . Carpal tunnel release   . Coronary artery bypass graft   . Left cleft palate and left cleft lip repair   . Cataract extraction   . C-spine surgery   . Icd/bi-v insertion 10-08-08    Dr Amil Amen (MDT) implant for primary prevention    No Known Allergies  Physical Exam:  Blood pressure 124/70, pulse 74, temperature 97.5 F (36.4 C), temperature source Oral, height 5\' 7"  (1.702 m), weight 251 lb 9.6 oz (114.125 kg), SpO2 92.00%. Body mass index is 39.41 kg/(m^2).  Wt Readings from  Last 2 Encounters:  04/05/12 251 lb 9.6 oz (114.125 kg)  01/12/12 245 lb 3.2 oz (111.222 kg)    General - Obese  HEENT - no sinus tenderness, no oral exudate, changes from cleft lip surgery, no LAN, nasal septal deviation Cardiac - s1s2 no murmur  Chest - decreased breath sounds, normal respiratory excursion, no wheeze/rales/dullness  Abdomen - soft, non-tender  Extremities - no e/c/c  Skin - no rashes  Neurologic - normal strength, CN intact  Psychiatric - normal mood, behavior   Assessment/Plan:  Outpatient Encounter Prescriptions as of 04/05/2012  Medication Sig Dispense Refill  . albuterol (PROVENTIL HFA;VENTOLIN HFA) 108 (90 BASE) MCG/ACT inhaler Inhale 2 puffs into the lungs every 4 (four) hours as needed.        [name] albuterol (PROVENTIL) (2.5 MG/3ML) 0.083% nebulizer solution USE 1 VIAL FOR INHALATION VIA NEBULIZER 4 TIMES DAILY  300 mL  3  . amiodarone (PACERONE) 200 MG tablet Take 200 mg by mouth daily.        [name] aspirin 81 MG tablet Take 81 mg by mouth daily.        [name] atorvastatin (LIPITOR) 80 MG tablet Take 80 mg by mouth daily.        . budesonide-formoterol (  SYMBICORT) 160-4.5 MCG/ACT inhaler Inhale 2 puffs into the lungs 2 (two) times daily.  1 Inhaler  12  . carvedilol (COREG) 12.5 MG tablet Take 12.5 mg by mouth 2 (two) times daily with a meal.        . furosemide (LASIX) 40 MG tablet Take 40 mg by mouth 2 (two) times daily.        . insulin aspart (NOVOLOG) 100 UNIT/ML injection 23 units before breakfast and dinner, 15 units before lunch      . insulin glargine (LANTUS SOLOSTAR) 100 UNIT/ML injection Inject 90 Units into the skin at bedtime.       [name] levothyroxine (SYNTHROID, LEVOTHROID) 75 MCG tablet Take 75 mcg by mouth daily.        . montelukast (SINGULAIR) 10 MG tablet Take 1 tablet (10 mg total) by mouth at bedtime.  30 tablet  5  . Multiple Vitamin (MULTIVITAMIN) tablet Take 1 tablet by mouth daily.        [name] omeprazole (PRILOSEC) 20 MG capsule Take 20 mg by mouth  daily.        [name] Spacer/Aero-Holding Chambers (AEROCHAMBER MV) inhaler Use as instructed  1 each  0  . spironolactone (ALDACTONE) 25 MG tablet 1/2 tablet once a day       . valsartan (DIOVAN) 80 MG tablet Take 80 mg by mouth daily.        [name] warfarin (COUMADIN) 5 MG tablet As directed per clinic       . predniSONE (DELTASONE) 20 MG tablet As directed        Conda Wannamaker Pager:  [phone] 04/05/2012, 10:35 AM

## 2012-04-14 LAB — REMOTE ICD DEVICE
AL AMPLITUDE: 1.4 mv
AL IMPEDENCE ICD: 536 Ohm
ATRIAL PACING ICD: 96.36 pct
BAMS-0001: 170 {beats}/min
BATTERY VOLTAGE: 2.99 V
BRDY-0002LV: 70 {beats}/min
BRDY-0003LV: 130 {beats}/min
BRDY-0004LV: 120 {beats}/min
CHARGE TIME: 11.441 s
FVT: 0
LV LEAD IMPEDENCE ICD: 1008 Ohm
LV LEAD THRESHOLD: 1 V
PACEART VT: 0
RV LEAD IMPEDENCE ICD: 736 Ohm
TOT-0001: 1
TOT-0002: 0
TOT-0006: 20091215000000
TZAT-0001ATACH: 1
TZAT-0001ATACH: 2
TZAT-0001ATACH: 3
TZAT-0001FASTVT: 1
TZAT-0001SLOWVT: 1
TZAT-0001SLOWVT: 2
TZAT-0002ATACH: NEGATIVE
TZAT-0002ATACH: NEGATIVE
TZAT-0002ATACH: NEGATIVE
TZAT-0002FASTVT: NEGATIVE
TZAT-0004SLOWVT: 8
TZAT-0004SLOWVT: 8
TZAT-0005SLOWVT: 88 pct
TZAT-0005SLOWVT: 91 pct
TZAT-0011SLOWVT: 10 ms
TZAT-0011SLOWVT: 10 ms
TZAT-0012ATACH: 150 ms
TZAT-0012ATACH: 150 ms
TZAT-0012ATACH: 150 ms
TZAT-0012FASTVT: 200 ms
TZAT-0012SLOWVT: 200 ms
TZAT-0012SLOWVT: 200 ms
TZAT-0013SLOWVT: 2
TZAT-0013SLOWVT: 2
TZAT-0018ATACH: NEGATIVE
TZAT-0018ATACH: NEGATIVE
TZAT-0018ATACH: NEGATIVE
TZAT-0018FASTVT: NEGATIVE
TZAT-0018SLOWVT: NEGATIVE
TZAT-0018SLOWVT: NEGATIVE
TZAT-0019ATACH: 6 V
TZAT-0019ATACH: 6 V
TZAT-0019ATACH: 6 V
TZAT-0019FASTVT: 8 V
TZAT-0019SLOWVT: 8 V
TZAT-0019SLOWVT: 8 V
TZAT-0020ATACH: 1.5 ms
TZAT-0020ATACH: 1.5 ms
TZAT-0020ATACH: 1.5 ms
TZAT-0020FASTVT: 1.5 ms
TZAT-0020SLOWVT: 1.5 ms
TZAT-0020SLOWVT: 1.5 ms
TZON-0003ATACH: 350 ms
TZON-0003SLOWVT: 340 ms
TZON-0003VSLOWVT: 400 ms
TZON-0004SLOWVT: 28
TZON-0004VSLOWVT: 20
TZON-0005SLOWVT: 12
TZST-0001ATACH: 4
TZST-0001ATACH: 5
TZST-0001ATACH: 6
TZST-0001FASTVT: 2
TZST-0001FASTVT: 3
TZST-0001FASTVT: 4
TZST-0001FASTVT: 5
TZST-0001FASTVT: 6
TZST-0001SLOWVT: 3
TZST-0001SLOWVT: 4
TZST-0001SLOWVT: 5
TZST-0001SLOWVT: 6
TZST-0002ATACH: NEGATIVE
TZST-0002ATACH: NEGATIVE
TZST-0002ATACH: NEGATIVE
TZST-0002FASTVT: NEGATIVE
TZST-0002FASTVT: NEGATIVE
TZST-0002FASTVT: NEGATIVE
TZST-0002FASTVT: NEGATIVE
TZST-0002FASTVT: NEGATIVE
TZST-0003SLOWVT: 20 J
TZST-0003SLOWVT: 35 J
TZST-0003SLOWVT: 35 J
TZST-0003SLOWVT: 35 J
VENTRICULAR PACING ICD: 99.97 pct
VF: 0

## 2012-04-18 ENCOUNTER — Encounter: Payer: Self-pay | Admitting: *Deleted

## 2012-04-18 DIAGNOSIS — I4891 Unspecified atrial fibrillation: Secondary | ICD-10-CM | POA: Diagnosis not present

## 2012-04-18 DIAGNOSIS — Z7901 Long term (current) use of anticoagulants: Secondary | ICD-10-CM | POA: Diagnosis not present

## 2012-04-19 ENCOUNTER — Ambulatory Visit (INDEPENDENT_AMBULATORY_CARE_PROVIDER_SITE_OTHER): Payer: Medicare Other | Admitting: Pulmonary Disease

## 2012-04-19 DIAGNOSIS — J438 Other emphysema: Secondary | ICD-10-CM

## 2012-04-19 DIAGNOSIS — J439 Emphysema, unspecified: Secondary | ICD-10-CM

## 2012-04-19 LAB — PULMONARY FUNCTION TEST

## 2012-04-19 NOTE — Progress Notes (Signed)
PFT done today. 

## 2012-04-22 ENCOUNTER — Telehealth: Payer: Self-pay | Admitting: Pulmonary Disease

## 2012-04-22 DIAGNOSIS — J439 Emphysema, unspecified: Secondary | ICD-10-CM

## 2012-04-22 NOTE — Telephone Encounter (Signed)
PFT 03/30/11>>FEV1 1.98(72%), FEV1% 68, DLCO 64%  PFT 04/19/12>>FEV1 1.65 (67%), FEV1% 63, TLC 5.66 (99%), DLCO 71%, no BD.  Will have my nurse inform patient that PFT looked okay.  Shows expected mild changes of COPD.  Okay to continue amiodarone.

## 2012-04-24 NOTE — Telephone Encounter (Signed)
I spoke with patient about results and she verbalized understanding and had no questions 

## 2012-05-03 ENCOUNTER — Encounter: Payer: Self-pay | Admitting: Pulmonary Disease

## 2012-05-05 DIAGNOSIS — L570 Actinic keratosis: Secondary | ICD-10-CM | POA: Diagnosis not present

## 2012-05-05 DIAGNOSIS — Z85828 Personal history of other malignant neoplasm of skin: Secondary | ICD-10-CM | POA: Diagnosis not present

## 2012-05-11 DIAGNOSIS — K219 Gastro-esophageal reflux disease without esophagitis: Secondary | ICD-10-CM | POA: Diagnosis not present

## 2012-05-11 DIAGNOSIS — E7800 Pure hypercholesterolemia, unspecified: Secondary | ICD-10-CM | POA: Diagnosis not present

## 2012-05-11 DIAGNOSIS — E1139 Type 2 diabetes mellitus with other diabetic ophthalmic complication: Secondary | ICD-10-CM | POA: Diagnosis not present

## 2012-05-11 DIAGNOSIS — I2510 Atherosclerotic heart disease of native coronary artery without angina pectoris: Secondary | ICD-10-CM | POA: Diagnosis not present

## 2012-05-11 DIAGNOSIS — I5022 Chronic systolic (congestive) heart failure: Secondary | ICD-10-CM | POA: Diagnosis not present

## 2012-05-11 DIAGNOSIS — I4891 Unspecified atrial fibrillation: Secondary | ICD-10-CM | POA: Diagnosis not present

## 2012-05-11 DIAGNOSIS — I10 Essential (primary) hypertension: Secondary | ICD-10-CM | POA: Diagnosis not present

## 2012-05-11 DIAGNOSIS — Z95810 Presence of automatic (implantable) cardiac defibrillator: Secondary | ICD-10-CM | POA: Diagnosis not present

## 2012-05-11 DIAGNOSIS — Z7901 Long term (current) use of anticoagulants: Secondary | ICD-10-CM | POA: Diagnosis not present

## 2012-05-11 DIAGNOSIS — Z79899 Other long term (current) drug therapy: Secondary | ICD-10-CM | POA: Diagnosis not present

## 2012-05-11 DIAGNOSIS — G4733 Obstructive sleep apnea (adult) (pediatric): Secondary | ICD-10-CM | POA: Diagnosis not present

## 2012-05-11 DIAGNOSIS — E785 Hyperlipidemia, unspecified: Secondary | ICD-10-CM | POA: Diagnosis not present

## 2012-06-15 ENCOUNTER — Ambulatory Visit (INDEPENDENT_AMBULATORY_CARE_PROVIDER_SITE_OTHER): Payer: Medicare Other | Admitting: *Deleted

## 2012-06-15 DIAGNOSIS — I2589 Other forms of chronic ischemic heart disease: Secondary | ICD-10-CM

## 2012-06-15 DIAGNOSIS — I10 Essential (primary) hypertension: Secondary | ICD-10-CM | POA: Diagnosis not present

## 2012-06-15 DIAGNOSIS — Z7901 Long term (current) use of anticoagulants: Secondary | ICD-10-CM | POA: Diagnosis not present

## 2012-06-15 DIAGNOSIS — I5022 Chronic systolic (congestive) heart failure: Secondary | ICD-10-CM

## 2012-06-15 DIAGNOSIS — Z95810 Presence of automatic (implantable) cardiac defibrillator: Secondary | ICD-10-CM | POA: Diagnosis not present

## 2012-06-15 DIAGNOSIS — I2510 Atherosclerotic heart disease of native coronary artery without angina pectoris: Secondary | ICD-10-CM | POA: Diagnosis not present

## 2012-06-15 DIAGNOSIS — I4891 Unspecified atrial fibrillation: Secondary | ICD-10-CM | POA: Diagnosis not present

## 2012-06-17 ENCOUNTER — Encounter: Payer: Self-pay | Admitting: Internal Medicine

## 2012-06-20 LAB — REMOTE ICD DEVICE
AL AMPLITUDE: 1.8 mv
AL IMPEDENCE ICD: 544 Ohm
ATRIAL PACING ICD: 98.55 pct
BAMS-0001: 170 {beats}/min
BATTERY VOLTAGE: 2.95 V
BRDY-0002LV: 70 {beats}/min
BRDY-0003LV: 130 {beats}/min
BRDY-0004LV: 120 {beats}/min
CHARGE TIME: 11.951 s
FVT: 0
LV LEAD IMPEDENCE ICD: 992 Ohm
LV LEAD THRESHOLD: 1 V
PACEART VT: 0
RV LEAD IMPEDENCE ICD: 664 Ohm
TOT-0001: 1
TOT-0002: 0
TOT-0006: 20091215000000
TZAT-0001ATACH: 1
TZAT-0001ATACH: 2
TZAT-0001ATACH: 3
TZAT-0001FASTVT: 1
TZAT-0001SLOWVT: 1
TZAT-0001SLOWVT: 2
TZAT-0002ATACH: NEGATIVE
TZAT-0002ATACH: NEGATIVE
TZAT-0002ATACH: NEGATIVE
TZAT-0002FASTVT: NEGATIVE
TZAT-0004SLOWVT: 8
TZAT-0004SLOWVT: 8
TZAT-0005SLOWVT: 88 pct
TZAT-0005SLOWVT: 91 pct
TZAT-0011SLOWVT: 10 ms
TZAT-0011SLOWVT: 10 ms
TZAT-0012ATACH: 150 ms
TZAT-0012ATACH: 150 ms
TZAT-0012ATACH: 150 ms
TZAT-0012FASTVT: 200 ms
TZAT-0012SLOWVT: 200 ms
TZAT-0012SLOWVT: 200 ms
TZAT-0013SLOWVT: 2
TZAT-0013SLOWVT: 2
TZAT-0018ATACH: NEGATIVE
TZAT-0018ATACH: NEGATIVE
TZAT-0018ATACH: NEGATIVE
TZAT-0018FASTVT: NEGATIVE
TZAT-0018SLOWVT: NEGATIVE
TZAT-0018SLOWVT: NEGATIVE
TZAT-0019ATACH: 6 V
TZAT-0019ATACH: 6 V
TZAT-0019ATACH: 6 V
TZAT-0019FASTVT: 8 V
TZAT-0019SLOWVT: 8 V
TZAT-0019SLOWVT: 8 V
TZAT-0020ATACH: 1.5 ms
TZAT-0020ATACH: 1.5 ms
TZAT-0020ATACH: 1.5 ms
TZAT-0020FASTVT: 1.5 ms
TZAT-0020SLOWVT: 1.5 ms
TZAT-0020SLOWVT: 1.5 ms
TZON-0003ATACH: 350 ms
TZON-0003SLOWVT: 340 ms
TZON-0003VSLOWVT: 400 ms
TZON-0004SLOWVT: 28
TZON-0004VSLOWVT: 20
TZON-0005SLOWVT: 12
TZST-0001ATACH: 4
TZST-0001ATACH: 5
TZST-0001ATACH: 6
TZST-0001FASTVT: 2
TZST-0001FASTVT: 3
TZST-0001FASTVT: 4
TZST-0001FASTVT: 5
TZST-0001FASTVT: 6
TZST-0001SLOWVT: 3
TZST-0001SLOWVT: 4
TZST-0001SLOWVT: 5
TZST-0001SLOWVT: 6
TZST-0002ATACH: NEGATIVE
TZST-0002ATACH: NEGATIVE
TZST-0002ATACH: NEGATIVE
TZST-0002FASTVT: NEGATIVE
TZST-0002FASTVT: NEGATIVE
TZST-0002FASTVT: NEGATIVE
TZST-0002FASTVT: NEGATIVE
TZST-0002FASTVT: NEGATIVE
TZST-0003SLOWVT: 20 J
TZST-0003SLOWVT: 35 J
TZST-0003SLOWVT: 35 J
TZST-0003SLOWVT: 35 J
VENTRICULAR PACING ICD: 99.98 pct
VF: 0

## 2012-06-24 ENCOUNTER — Other Ambulatory Visit: Payer: Self-pay | Admitting: Pulmonary Disease

## 2012-06-27 ENCOUNTER — Encounter: Payer: Self-pay | Admitting: *Deleted

## 2012-07-13 DIAGNOSIS — Z7901 Long term (current) use of anticoagulants: Secondary | ICD-10-CM | POA: Diagnosis not present

## 2012-07-13 DIAGNOSIS — I4891 Unspecified atrial fibrillation: Secondary | ICD-10-CM | POA: Diagnosis not present

## 2012-07-17 DIAGNOSIS — M5430 Sciatica, unspecified side: Secondary | ICD-10-CM | POA: Diagnosis not present

## 2012-07-17 DIAGNOSIS — M1710 Unilateral primary osteoarthritis, unspecified knee: Secondary | ICD-10-CM | POA: Diagnosis not present

## 2012-07-18 ENCOUNTER — Other Ambulatory Visit: Payer: Self-pay | Admitting: Orthopaedic Surgery

## 2012-07-18 DIAGNOSIS — M545 Low back pain, unspecified: Secondary | ICD-10-CM

## 2012-07-18 DIAGNOSIS — M4310 Spondylolisthesis, site unspecified: Secondary | ICD-10-CM

## 2012-07-26 ENCOUNTER — Other Ambulatory Visit: Payer: Self-pay | Admitting: Pulmonary Disease

## 2012-08-09 ENCOUNTER — Ambulatory Visit
Admission: RE | Admit: 2012-08-09 | Discharge: 2012-08-09 | Disposition: A | Discharge status: Home / Self Care | Payer: Medicare Other | Source: Ambulatory Visit | Attending: Orthopaedic Surgery | Admitting: Orthopaedic Surgery

## 2012-08-09 DIAGNOSIS — M545 Low back pain, unspecified: Secondary | ICD-10-CM

## 2012-08-09 DIAGNOSIS — M4310 Spondylolisthesis, site unspecified: Secondary | ICD-10-CM

## 2012-08-09 DIAGNOSIS — M48061 Spinal stenosis, lumbar region without neurogenic claudication: Secondary | ICD-10-CM | POA: Diagnosis not present

## 2012-08-09 DIAGNOSIS — M5126 Other intervertebral disc displacement, lumbar region: Secondary | ICD-10-CM | POA: Diagnosis not present

## 2012-08-09 DIAGNOSIS — Z136 Encounter for screening for cardiovascular disorders: Secondary | ICD-10-CM | POA: Diagnosis not present

## 2012-08-14 DIAGNOSIS — M5430 Sciatica, unspecified side: Secondary | ICD-10-CM | POA: Diagnosis not present

## 2012-08-14 DIAGNOSIS — M48061 Spinal stenosis, lumbar region without neurogenic claudication: Secondary | ICD-10-CM | POA: Diagnosis not present

## 2012-08-15 DIAGNOSIS — I2510 Atherosclerotic heart disease of native coronary artery without angina pectoris: Secondary | ICD-10-CM | POA: Diagnosis not present

## 2012-08-15 DIAGNOSIS — Z7901 Long term (current) use of anticoagulants: Secondary | ICD-10-CM | POA: Diagnosis not present

## 2012-08-15 DIAGNOSIS — I4891 Unspecified atrial fibrillation: Secondary | ICD-10-CM | POA: Diagnosis not present

## 2012-08-15 DIAGNOSIS — Z79899 Other long term (current) drug therapy: Secondary | ICD-10-CM | POA: Diagnosis not present

## 2012-08-16 DIAGNOSIS — D485 Neoplasm of uncertain behavior of skin: Secondary | ICD-10-CM | POA: Diagnosis not present

## 2012-08-16 DIAGNOSIS — L570 Actinic keratosis: Secondary | ICD-10-CM | POA: Diagnosis not present

## 2012-08-21 DIAGNOSIS — M48061 Spinal stenosis, lumbar region without neurogenic claudication: Secondary | ICD-10-CM | POA: Diagnosis not present

## 2012-08-21 DIAGNOSIS — Z7901 Long term (current) use of anticoagulants: Secondary | ICD-10-CM | POA: Diagnosis not present

## 2012-08-21 DIAGNOSIS — I4891 Unspecified atrial fibrillation: Secondary | ICD-10-CM | POA: Diagnosis not present

## 2012-08-21 DIAGNOSIS — M4310 Spondylolisthesis, site unspecified: Secondary | ICD-10-CM | POA: Diagnosis not present

## 2012-08-21 DIAGNOSIS — IMO0002 Reserved for concepts with insufficient information to code with codable children: Secondary | ICD-10-CM | POA: Diagnosis not present

## 2012-08-28 DIAGNOSIS — Z23 Encounter for immunization: Secondary | ICD-10-CM | POA: Diagnosis not present

## 2012-09-04 DIAGNOSIS — Z7901 Long term (current) use of anticoagulants: Secondary | ICD-10-CM | POA: Diagnosis not present

## 2012-09-04 DIAGNOSIS — I4891 Unspecified atrial fibrillation: Secondary | ICD-10-CM | POA: Diagnosis not present

## 2012-09-05 DIAGNOSIS — Z1331 Encounter for screening for depression: Secondary | ICD-10-CM | POA: Diagnosis not present

## 2012-09-05 DIAGNOSIS — Z23 Encounter for immunization: Secondary | ICD-10-CM | POA: Diagnosis not present

## 2012-09-05 DIAGNOSIS — E1139 Type 2 diabetes mellitus with other diabetic ophthalmic complication: Secondary | ICD-10-CM | POA: Diagnosis not present

## 2012-09-05 DIAGNOSIS — Z0000 Encounter for general adult medical examination without abnormal findings: Secondary | ICD-10-CM | POA: Diagnosis not present

## 2012-09-05 DIAGNOSIS — N183 Chronic kidney disease, stage 3 unspecified: Secondary | ICD-10-CM | POA: Diagnosis not present

## 2012-09-05 DIAGNOSIS — E039 Hypothyroidism, unspecified: Secondary | ICD-10-CM | POA: Diagnosis not present

## 2012-09-05 DIAGNOSIS — E1165 Type 2 diabetes mellitus with hyperglycemia: Secondary | ICD-10-CM | POA: Diagnosis not present

## 2012-09-13 DIAGNOSIS — M48061 Spinal stenosis, lumbar region without neurogenic claudication: Secondary | ICD-10-CM | POA: Diagnosis not present

## 2012-09-13 DIAGNOSIS — M25569 Pain in unspecified knee: Secondary | ICD-10-CM | POA: Diagnosis not present

## 2012-09-25 ENCOUNTER — Telehealth: Payer: Self-pay | Admitting: Internal Medicine

## 2012-09-25 ENCOUNTER — Encounter: Payer: Self-pay | Admitting: Internal Medicine

## 2012-09-25 ENCOUNTER — Ambulatory Visit (INDEPENDENT_AMBULATORY_CARE_PROVIDER_SITE_OTHER): Payer: Medicare Other | Admitting: *Deleted

## 2012-09-25 DIAGNOSIS — Z95810 Presence of automatic (implantable) cardiac defibrillator: Secondary | ICD-10-CM | POA: Diagnosis not present

## 2012-09-25 DIAGNOSIS — I5022 Chronic systolic (congestive) heart failure: Secondary | ICD-10-CM | POA: Diagnosis not present

## 2012-09-25 DIAGNOSIS — I2589 Other forms of chronic ischemic heart disease: Secondary | ICD-10-CM

## 2012-09-25 NOTE — Telephone Encounter (Signed)
Transmission received patient aware. 

## 2012-09-25 NOTE — Telephone Encounter (Signed)
New Problem:    Patient's wife called in wanting to know if her husbands transmission was received.  Please call back.

## 2012-09-29 DIAGNOSIS — I4891 Unspecified atrial fibrillation: Secondary | ICD-10-CM | POA: Diagnosis not present

## 2012-09-29 DIAGNOSIS — Z7901 Long term (current) use of anticoagulants: Secondary | ICD-10-CM | POA: Diagnosis not present

## 2012-09-29 DIAGNOSIS — I739 Peripheral vascular disease, unspecified: Secondary | ICD-10-CM | POA: Diagnosis not present

## 2012-10-02 LAB — REMOTE ICD DEVICE
AL AMPLITUDE: 2.3 mv
AL IMPEDENCE ICD: 536 Ohm
ATRIAL PACING ICD: 97.39 pct
BAMS-0001: 170 {beats}/min
BATTERY VOLTAGE: 2.88 V
BRDY-0002LV: 70 {beats}/min
BRDY-0003LV: 130 {beats}/min
BRDY-0004LV: 120 {beats}/min
CHARGE TIME: 11.951 s
FVT: 0
LV LEAD IMPEDENCE ICD: 984 Ohm
LV LEAD THRESHOLD: 1 V
PACEART VT: 0
RV LEAD IMPEDENCE ICD: 672 Ohm
TOT-0001: 1
TOT-0002: 0
TOT-0006: 20091215000000
TZAT-0001ATACH: 1
TZAT-0001ATACH: 2
TZAT-0001ATACH: 3
TZAT-0001FASTVT: 1
TZAT-0001SLOWVT: 1
TZAT-0001SLOWVT: 2
TZAT-0002ATACH: NEGATIVE
TZAT-0002ATACH: NEGATIVE
TZAT-0002ATACH: NEGATIVE
TZAT-0002FASTVT: NEGATIVE
TZAT-0004SLOWVT: 8
TZAT-0004SLOWVT: 8
TZAT-0005SLOWVT: 88 pct
TZAT-0005SLOWVT: 91 pct
TZAT-0011SLOWVT: 10 ms
TZAT-0011SLOWVT: 10 ms
TZAT-0012ATACH: 150 ms
TZAT-0012ATACH: 150 ms
TZAT-0012ATACH: 150 ms
TZAT-0012FASTVT: 200 ms
TZAT-0012SLOWVT: 200 ms
TZAT-0012SLOWVT: 200 ms
TZAT-0013SLOWVT: 2
TZAT-0013SLOWVT: 2
TZAT-0018ATACH: NEGATIVE
TZAT-0018ATACH: NEGATIVE
TZAT-0018ATACH: NEGATIVE
TZAT-0018FASTVT: NEGATIVE
TZAT-0018SLOWVT: NEGATIVE
TZAT-0018SLOWVT: NEGATIVE
TZAT-0019ATACH: 6 V
TZAT-0019ATACH: 6 V
TZAT-0019ATACH: 6 V
TZAT-0019FASTVT: 8 V
TZAT-0019SLOWVT: 8 V
TZAT-0019SLOWVT: 8 V
TZAT-0020ATACH: 1.5 ms
TZAT-0020ATACH: 1.5 ms
TZAT-0020ATACH: 1.5 ms
TZAT-0020FASTVT: 1.5 ms
TZAT-0020SLOWVT: 1.5 ms
TZAT-0020SLOWVT: 1.5 ms
TZON-0003ATACH: 350 ms
TZON-0003SLOWVT: 340 ms
TZON-0003VSLOWVT: 400 ms
TZON-0004SLOWVT: 28
TZON-0004VSLOWVT: 20
TZON-0005SLOWVT: 12
TZST-0001ATACH: 4
TZST-0001ATACH: 5
TZST-0001ATACH: 6
TZST-0001FASTVT: 2
TZST-0001FASTVT: 3
TZST-0001FASTVT: 4
TZST-0001FASTVT: 5
TZST-0001FASTVT: 6
TZST-0001SLOWVT: 3
TZST-0001SLOWVT: 4
TZST-0001SLOWVT: 5
TZST-0001SLOWVT: 6
TZST-0002ATACH: NEGATIVE
TZST-0002ATACH: NEGATIVE
TZST-0002ATACH: NEGATIVE
TZST-0002FASTVT: NEGATIVE
TZST-0002FASTVT: NEGATIVE
TZST-0002FASTVT: NEGATIVE
TZST-0002FASTVT: NEGATIVE
TZST-0002FASTVT: NEGATIVE
TZST-0003SLOWVT: 20 J
TZST-0003SLOWVT: 35 J
TZST-0003SLOWVT: 35 J
TZST-0003SLOWVT: 35 J
VENTRICULAR PACING ICD: 99.97 pct
VF: 0

## 2012-10-03 ENCOUNTER — Encounter: Payer: Self-pay | Admitting: Pulmonary Disease

## 2012-10-03 ENCOUNTER — Ambulatory Visit (INDEPENDENT_AMBULATORY_CARE_PROVIDER_SITE_OTHER): Payer: Medicare Other | Admitting: Pulmonary Disease

## 2012-10-03 VITALS — BP 132/76 | HR 85 | Temp 97.8°F | Ht 67.0 in | Wt 248.4 lb

## 2012-10-03 DIAGNOSIS — G4733 Obstructive sleep apnea (adult) (pediatric): Secondary | ICD-10-CM

## 2012-10-03 DIAGNOSIS — J438 Other emphysema: Secondary | ICD-10-CM

## 2012-10-03 DIAGNOSIS — J439 Emphysema, unspecified: Secondary | ICD-10-CM

## 2012-10-03 MED ORDER — ALBUTEROL SULFATE (2.5 MG/3ML) 0.083% IN NEBU: 2.5000 mg | INHALATION_SOLUTION | Freq: Four times a day (QID) | RESPIRATORY_TRACT | Status: DC | PRN

## 2012-10-03 MED ORDER — ALBUTEROL SULFATE HFA 108 (90 BASE) MCG/ACT IN AERS: 2.0000 | INHALATION_SPRAY | Freq: Four times a day (QID) | RESPIRATORY_TRACT | Status: DC | PRN

## 2012-10-03 NOTE — Assessment & Plan Note (Signed)
He is compliant with CPAP and reports benefit from therapy.  He is due for a new machine.  Will arrange for new CPAP and get download from his new machine.

## 2012-10-03 NOTE — Progress Notes (Signed)
Chief Complaint  Patient presents with  . Follow-up    Pt reports breathing is "about the same" reports he has no energy no other concerns    History of Present Illness: [name] is a 78 y.o. male former smoker with COPD/Emphysema, post-nasal drip, A fib on amiodarone and OSA on CPAP 9 cm.  His breathing has been doing okay.  He has been using his albuterol 4 to 6 times per day.  He doesn't feel like he needs albuterol that often, but thought he was supposed to use it this much.  He is gets occasional cough with clear sputum.  He is not having wheeze or chest tightness.  He uses his CPAP every night.  He feels rested, and has no problems with his mask.  His current machine is almost 76 years old.  He would like his wife to be contacted for any medical supply set up [phone]).  TESTS: PSG 06/14/06>>AHI 35.5  PFT 03/30/11>>FEV1 1.98(72%), FEV1% 68, DLCO 64%  PFT 04/19/12>>FEV1 1.65 (67%), FEV1% 63, TLC 5.66 (99%), DLCO 71%, no BD.   Past Medical History  Diagnosis Date  . Dyspnea   . OSA (obstructive sleep apnea)   . CAD (coronary artery disease)   . HTN (hypertension)   . DM (diabetes mellitus), type 2   . Hyperlipidemia   . Systolic congestive heart failure 2009    s/p BiV ICD implantation by Dr Amil Amen (MDT)  . Atrial fibrillation     persistent, previously seen at [facility] and placed on amiodarone  . Hypothyroidism   . PNA (pneumonia)   . Cleft palate   . Nasal septal deviation   . Iron deficiency anemia   . GERD (gastroesophageal reflux disease)   . Benign prostatic hypertrophy   . Nephrolithiasis   . Psoriasis   . Seborrheic keratosis   . COPD with emphysema 04/01/2010    Past Surgical History  Procedure Date  . Carpal tunnel release   . Coronary artery bypass graft   . Left cleft palate and left cleft lip repair   . Cataract extraction   . C-spine surgery   . Icd/bi-v insertion 10-08-08    Dr Amil Amen (MDT) implant for primary prevention     Outpatient Encounter Prescriptions as of 10/03/2012  Medication Sig Dispense Refill  . albuterol (PROVENTIL HFA;VENTOLIN HFA) 108 (90 BASE) MCG/ACT inhaler Inhale 2 puffs into the lungs every 4 (four) hours as needed.        [name] albuterol (PROVENTIL) (2.5 MG/3ML) 0.083% nebulizer solution INHALE CONTENTS OF 1VIAL VIA NEBULIZER 4 TIMES DAILY AS DIRECTED  300 mL  3  . amiodarone (PACERONE) 200 MG tablet Take 200 mg by mouth daily.        [name] aspirin 81 MG tablet Take 81 mg by mouth daily.        [name] atorvastatin (LIPITOR) 80 MG tablet Take 80 mg by mouth daily.        . budesonide-formoterol (SYMBICORT) 160-4.5 MCG/ACT inhaler Inhale 2 puffs into the lungs 2 (two) times daily.  1 Inhaler  12  . carvedilol (COREG) 12.5 MG tablet Take 12.5 mg by mouth 2 (two) times daily with a meal.        . furosemide (LASIX) 40 MG tablet Take 40 mg by mouth 2 (two) times daily.        . insulin aspart (NOVOLOG) 100 UNIT/ML injection 23 units before breakfast and dinner, 15 units before lunch      . insulin glargine (  LANTUS SOLOSTAR) 100 UNIT/ML injection Inject 90 Units into the skin at bedtime.       [name] levothyroxine (SYNTHROID, LEVOTHROID) 75 MCG tablet Take 75 mcg by mouth daily.        . montelukast (SINGULAIR) 10 MG tablet TAKE 1 TABLET BY MOUTH AT BEDTIME  30 tablet  5  . Multiple Vitamin (MULTIVITAMIN) tablet Take 1 tablet by mouth daily.        [name] omeprazole (PRILOSEC) 20 MG capsule Take 20 mg by mouth daily.        [name] Spacer/Aero-Holding Chambers (AEROCHAMBER MV) inhaler Use as instructed  1 each  0  . spironolactone (ALDACTONE) 25 MG tablet 1/2 tablet once a day       . valsartan (DIOVAN) 80 MG tablet Take 80 mg by mouth daily.        [name] warfarin (COUMADIN) 5 MG tablet As directed per clinic         No Known Allergies  Physical Exam:  Filed Vitals:   10/03/12 1621  BP: 132/76  Pulse: 85  Temp: 97.8 F (36.6 C)  TempSrc: Oral  Height: 5\' 7"  (1.702 m)  Weight: 248 lb 6.4 oz (112.674 kg)  SpO2:  94%     Current Encounter SPO2  10/03/12 1621 94%  04/05/12 1020 92%  01/12/12 1343 94%     Body mass index is 38.91 kg/(m^2).   Wt Readings from Last 2 Encounters:  10/03/12 248 lb 6.4 oz (112.674 kg)  04/05/12 251 lb 9.6 oz (114.125 kg)     General - No distress ENT - No sinus tenderness, no oral exudate, changes of cleft lip surger, nasal septal deviation, no LAN Cardiac - s1s2 regular, no murmur Chest - Decreased breath sounds, no wheeze/rales/dullness Back - No focal tenderness Abd - Soft, non-tender Ext - No edema Neuro - Normal strength Skin - No rashes Psych - normal mood, and behavior   Assessment/Plan:  Coralyn Helling, MD [place] [facility]Critical [facility]/Sleep Pager:  [phone] 10/03/2012, 4:33 PM

## 2012-10-03 NOTE — Assessment & Plan Note (Signed)
Stable on current regimen.  Advised he only needs to use albuterol as needed, and not on scheduled basis.

## 2012-10-03 NOTE — Patient Instructions (Signed)
Will arrange for new CPAP machine Symbicort two puffs twice per day, and rinse mouth after each use Singulair 10 mg pill nightly before bedtime Albuterol one vial nebulized up to 4 times per day as needed for cough, wheeze, or chest congestion Albuterol inhaler two puffs up to 4 times per day as needed for cough, wheeze, or chest congestion Follow up in 6 months

## 2012-10-04 ENCOUNTER — Encounter: Payer: Self-pay | Admitting: *Deleted

## 2012-10-13 ENCOUNTER — Telehealth: Payer: Self-pay | Admitting: Pulmonary Disease

## 2012-10-13 NOTE — Telephone Encounter (Signed)
Spoke to wife she is aware all orders go through main office but if he used [facility] they will given them the order and someone from  that branch will contact pt [name]

## 2012-11-06 ENCOUNTER — Other Ambulatory Visit: Payer: Self-pay | Admitting: Pulmonary Disease

## 2012-11-09 DIAGNOSIS — Z79899 Other long term (current) drug therapy: Secondary | ICD-10-CM | POA: Diagnosis not present

## 2012-11-09 DIAGNOSIS — I5022 Chronic systolic (congestive) heart failure: Secondary | ICD-10-CM | POA: Diagnosis not present

## 2012-11-09 DIAGNOSIS — I2510 Atherosclerotic heart disease of native coronary artery without angina pectoris: Secondary | ICD-10-CM | POA: Diagnosis not present

## 2012-11-09 DIAGNOSIS — I4891 Unspecified atrial fibrillation: Secondary | ICD-10-CM | POA: Diagnosis not present

## 2012-11-09 DIAGNOSIS — E785 Hyperlipidemia, unspecified: Secondary | ICD-10-CM | POA: Diagnosis not present

## 2012-11-09 DIAGNOSIS — I10 Essential (primary) hypertension: Secondary | ICD-10-CM | POA: Diagnosis not present

## 2012-11-09 DIAGNOSIS — Z7901 Long term (current) use of anticoagulants: Secondary | ICD-10-CM | POA: Diagnosis not present

## 2012-11-09 DIAGNOSIS — G4733 Obstructive sleep apnea (adult) (pediatric): Secondary | ICD-10-CM | POA: Diagnosis not present

## 2012-12-01 ENCOUNTER — Other Ambulatory Visit: Payer: Self-pay | Admitting: Pulmonary Disease

## 2012-12-14 ENCOUNTER — Encounter: Payer: Self-pay | Admitting: Cardiology

## 2012-12-14 ENCOUNTER — Encounter: Payer: Self-pay | Admitting: Internal Medicine

## 2012-12-14 ENCOUNTER — Ambulatory Visit (INDEPENDENT_AMBULATORY_CARE_PROVIDER_SITE_OTHER): Payer: Medicare Other | Admitting: Cardiology

## 2012-12-14 VITALS — BP 108/72 | HR 80 | Ht 69.0 in | Wt 249.0 lb

## 2012-12-14 DIAGNOSIS — I4891 Unspecified atrial fibrillation: Secondary | ICD-10-CM

## 2012-12-14 DIAGNOSIS — I5022 Chronic systolic (congestive) heart failure: Secondary | ICD-10-CM | POA: Diagnosis not present

## 2012-12-14 DIAGNOSIS — Z95810 Presence of automatic (implantable) cardiac defibrillator: Secondary | ICD-10-CM | POA: Diagnosis not present

## 2012-12-14 DIAGNOSIS — I2589 Other forms of chronic ischemic heart disease: Secondary | ICD-10-CM

## 2012-12-14 DIAGNOSIS — I255 Ischemic cardiomyopathy: Secondary | ICD-10-CM

## 2012-12-14 LAB — ICD DEVICE OBSERVATION
AL AMPLITUDE: 2.1 mv
AL IMPEDENCE ICD: 512 Ohm
AL THRESHOLD: 1 V
ATRIAL PACING ICD: 97.7 pct
BAMS-0001: 170 {beats}/min
BATTERY VOLTAGE: 2.78 V
FVT: 0
LV LEAD IMPEDENCE ICD: 1000 Ohm
LV LEAD THRESHOLD: 1 V
PACEART VT: 0
RV LEAD AMPLITUDE: 9.5 mv
RV LEAD IMPEDENCE ICD: 640 Ohm
RV LEAD THRESHOLD: 1 V
TZAT-0001ATACH: 1
TZAT-0001ATACH: 2
TZAT-0001ATACH: 3
TZAT-0001FASTVT: 1
TZAT-0001SLOWVT: 1
TZAT-0001SLOWVT: 2
TZAT-0002ATACH: NEGATIVE
TZAT-0002ATACH: NEGATIVE
TZAT-0002ATACH: NEGATIVE
TZAT-0002FASTVT: NEGATIVE
TZAT-0004SLOWVT: 8
TZAT-0004SLOWVT: 8
TZAT-0005SLOWVT: 88 pct
TZAT-0005SLOWVT: 91 pct
TZAT-0011SLOWVT: 10 ms
TZAT-0011SLOWVT: 10 ms
TZAT-0012ATACH: 150 ms
TZAT-0012ATACH: 150 ms
TZAT-0012ATACH: 150 ms
TZAT-0012FASTVT: 200 ms
TZAT-0012SLOWVT: 200 ms
TZAT-0012SLOWVT: 200 ms
TZAT-0013SLOWVT: 2
TZAT-0013SLOWVT: 2
TZAT-0018ATACH: NEGATIVE
TZAT-0018ATACH: NEGATIVE
TZAT-0018ATACH: NEGATIVE
TZAT-0018FASTVT: NEGATIVE
TZAT-0018SLOWVT: NEGATIVE
TZAT-0018SLOWVT: NEGATIVE
TZAT-0019ATACH: 6 V
TZAT-0019ATACH: 6 V
TZAT-0019ATACH: 6 V
TZAT-0019FASTVT: 8 V
TZAT-0019SLOWVT: 8 V
TZAT-0019SLOWVT: 8 V
TZAT-0020ATACH: 1.5 ms
TZAT-0020ATACH: 1.5 ms
TZAT-0020ATACH: 1.5 ms
TZAT-0020FASTVT: 1.5 ms
TZAT-0020SLOWVT: 1.5 ms
TZAT-0020SLOWVT: 1.5 ms
TZON-0003ATACH: 350 ms
TZON-0003SLOWVT: 340 ms
TZON-0003VSLOWVT: 400 ms
TZON-0004SLOWVT: 28
TZON-0004VSLOWVT: 20
TZON-0005SLOWVT: 12
TZST-0001ATACH: 4
TZST-0001ATACH: 5
TZST-0001ATACH: 6
TZST-0001FASTVT: 2
TZST-0001FASTVT: 3
TZST-0001FASTVT: 4
TZST-0001FASTVT: 5
TZST-0001FASTVT: 6
TZST-0001SLOWVT: 3
TZST-0001SLOWVT: 4
TZST-0001SLOWVT: 5
TZST-0001SLOWVT: 6
TZST-0002ATACH: NEGATIVE
TZST-0002ATACH: NEGATIVE
TZST-0002ATACH: NEGATIVE
TZST-0002FASTVT: NEGATIVE
TZST-0002FASTVT: NEGATIVE
TZST-0002FASTVT: NEGATIVE
TZST-0002FASTVT: NEGATIVE
TZST-0002FASTVT: NEGATIVE
TZST-0003SLOWVT: 20 J
TZST-0003SLOWVT: 35 J
TZST-0003SLOWVT: 35 J
TZST-0003SLOWVT: 35 J
VENTRICULAR PACING ICD: 100 pct
VF: 0

## 2012-12-14 NOTE — Progress Notes (Signed)
ELECTROPHYSIOLOGY OFFICE NOTE  Patient ID: [name] MRN: [id number], DOB/AGE: 1934-05-22   Date of Visit: 12/14/2012  Primary Physician: Lillia Mountain, MD Primary Cardiologist: Armanda Magic, MD Primary EP: Hillis Range, MD Reason for Visit: EP/device follow-up  History of Present Illness  [name] is a 79 year old man with an ischemic CM and chronic systolic HF s/p BiV ICD implant for CRT and primary prevention SCD who presents today for routine electrophysiology/device followup. Since last being seen in our clinic, he reports he is doing well without cardiac complaints. Today, he specifically denies chest pain or shortness of breath. He denies palpitations, dizziness, near syncope or syncope. He denies LE swelling, orthopnea, PND or recent weight gain. [name] states he is compliant and tolerating medications without difficulty.  Past Medical History Past Medical History  Diagnosis Date  . Dyspnea   . OSA (obstructive sleep apnea)   . CAD (coronary artery disease)   . HTN (hypertension)   . DM (diabetes mellitus), type 2   . Hyperlipidemia   . Systolic congestive heart failure 2009    s/p BiV ICD implantation by Dr Amil Amen (MDT)  . Atrial fibrillation     persistent, previously seen at [facility] and placed on amiodarone  . Hypothyroidism   . PNA (pneumonia)   . Cleft palate   . Nasal septal deviation   . Iron deficiency anemia   . GERD (gastroesophageal reflux disease)   . Benign prostatic hypertrophy   . Nephrolithiasis   . Psoriasis   . Seborrheic keratosis   . COPD with emphysema 04/01/2010    Past Surgical History Past Surgical History  Procedure Laterality Date  . Carpal tunnel release    . Coronary artery bypass graft    . Left cleft palate and left cleft lip repair    . Cataract extraction    . C-spine surgery    . Icd/bi-v insertion  10-08-08    Dr Amil Amen (MDT) implant for primary prevention     Allergies/Intolerances No Known  Allergies  Current Home Medications Current Outpatient Prescriptions  Medication Sig Dispense Refill  . albuterol (PROVENTIL HFA;VENTOLIN HFA) 108 (90 BASE) MCG/ACT inhaler Inhale 2 puffs into the lungs every 6 (six) hours as needed.      [name] albuterol (PROVENTIL) (2.5 MG/3ML) 0.083% nebulizer solution Take 3 mLs (2.5 mg total) by nebulization every 6 (six) hours as needed for wheezing.  300 mL  3  . amiodarone (PACERONE) 200 MG tablet Take 200 mg by mouth daily.        [name] aspirin 81 MG tablet Take 81 mg by mouth daily.        [name] atorvastatin (LIPITOR) 80 MG tablet Take 80 mg by mouth daily.        . carvedilol (COREG) 12.5 MG tablet Take 12.5 mg by mouth 2 (two) times daily with a meal.        . furosemide (LASIX) 40 MG tablet Take 40 mg by mouth 2 (two) times daily.        . insulin aspart (NOVOLOG) 100 UNIT/ML injection 23 units before breakfast and dinner, 15 units before lunch      . insulin glargine (LANTUS SOLOSTAR) 100 UNIT/ML injection Inject 90 Units into the skin at bedtime.       [name] levothyroxine (SYNTHROID, LEVOTHROID) 75 MCG tablet Take 75 mcg by mouth daily.        . montelukast (SINGULAIR) 10 MG tablet TAKE 1 TABLET BY MOUTH AT  BEDTIME  30 tablet  5  . Multiple Vitamin (MULTIVITAMIN) tablet Take 1 tablet by mouth daily.        [name] omeprazole (PRILOSEC) 20 MG capsule Take 20 mg by mouth daily.        [name] spironolactone (ALDACTONE) 25 MG tablet 1/2 tablet once a day       . SYMBICORT 160-4.5 MCG/ACT inhaler INHALE 2 PUFFS INTO THE LUNGS TWICE DAILY  1 Inhaler  6  . valsartan (DIOVAN) 80 MG tablet Take 80 mg by mouth daily.        [name] warfarin (COUMADIN) 5 MG tablet As directed per clinic        Social History Social History  . Marital Status: Married   Occupational History  . retired    Social History Main Topics  . Smoking status: Former Smoker -- 3.00 packs/day for 65 years    Types: Cigarettes    Quit date: 10/25/1998  . Smokeless tobacco: Never Used  . Alcohol Use: No  .  Drug Use: No   Review of Systems General: No chills, fever, night sweats or weight changes Cardiovascular: No chest pain, dyspnea on exertion, edema, orthopnea, palpitations, paroxysmal nocturnal dyspnea Dermatological: No rash, lesions or masses Respiratory: No cough, dyspnea Urologic: No hematuria, dysuria Abdominal: No nausea, vomiting, diarrhea, bright red blood per rectum, melena, or hematemesis Neurologic: No visual changes, weakness, changes in mental status All other systems reviewed and are otherwise negative except as noted above.  Physical Exam Blood pressure 108/72, pulse 80, height 5\' 9"  (1.753 m), weight 249 lb (112.946 kg), SpO2 97.00%.  General: Well developed, well appearing 79 year old male in no acute distress. HEENT: Normocephalic, atraumatic. EOMs intact. Sclera nonicteric. Oropharynx clear.  Neck: Supple. No JVD. Lungs: Respirations regular and unlabored, CTA bilaterally. No wheezes, rales or rhonchi. Heart: RRR. S1, S2 present. No murmurs, rub, S3 or S4. Abdomen: Soft, non-distended. Extremities: No clubbing, cyanosis or edema. DP/PT/Radials 2+ and equal bilaterally. Psych: Normal affect. Neuro: Alert and oriented X 3. Moves all extremities spontaneously.   Diagnostics 12-lead ECG shows AV paced rhythm at 71 bpm; QRS 140 msec Device interrogation performed today Normal BiV ICD function. Thresholds and sensing consistent with previous device measurements. Lead impedance trends stable over time. 1 AT/AF episode recorded, 66 seconds in duration. No ventricular arrhythmia episodes recorded. Patient bi-ventricularly pacing >99% of the time. Device programmed with appropriate safety margins. Heart failure diagnostics reviewed and trends are stable for patient.    Assessment and Plan 1. Ischemic CM with chronic systolic HF s/p BiV ICD implant for CRT and primary prevention SCD Normal device function No programming changes made See PaceArt report Euvolemic by  exam Continue medical therapy in addition to low sodium diet and daily weights Continue remote device checks every 3 months and return to clinic to see Dr. Johney Frame in one year 2. CAD Stable without anginal symptoms Continue medical therapy and routine follow-up with primary cardiologist, Dr. Mayford Knife   Signed, [name] Batrez, PA-C 12/14/2012, 11:48 AM

## 2012-12-14 NOTE — Patient Instructions (Signed)
Your physician wants you to follow-up in: 12 months with Dr Allred You will receive a reminder letter in the mail two months in advance. If you don't receive a letter, please call our office to schedule the follow-up appointment.  

## 2012-12-20 ENCOUNTER — Telehealth: Payer: Self-pay | Admitting: Pulmonary Disease

## 2012-12-20 DIAGNOSIS — G4733 Obstructive sleep apnea (adult) (pediatric): Secondary | ICD-10-CM

## 2012-12-20 NOTE — Telephone Encounter (Signed)
CPAP 11/14/12 to 12/02/12 >> used on 19 of 19 nights with average 7 hrs 43 min.  Average AHI 30.8 (majority centrals) with CPAP 9 cm H2O.  Results d/w pt's wife.  Explained he will need auto CPAP titration at home.  Will call with repeat download.

## 2012-12-21 DIAGNOSIS — I4891 Unspecified atrial fibrillation: Secondary | ICD-10-CM | POA: Diagnosis not present

## 2012-12-21 DIAGNOSIS — Z7901 Long term (current) use of anticoagulants: Secondary | ICD-10-CM | POA: Diagnosis not present

## 2013-01-03 DIAGNOSIS — K219 Gastro-esophageal reflux disease without esophagitis: Secondary | ICD-10-CM | POA: Diagnosis not present

## 2013-01-03 DIAGNOSIS — E1165 Type 2 diabetes mellitus with hyperglycemia: Secondary | ICD-10-CM | POA: Diagnosis not present

## 2013-01-03 DIAGNOSIS — I10 Essential (primary) hypertension: Secondary | ICD-10-CM | POA: Diagnosis not present

## 2013-01-03 DIAGNOSIS — N183 Chronic kidney disease, stage 3 unspecified: Secondary | ICD-10-CM | POA: Diagnosis not present

## 2013-01-03 DIAGNOSIS — E1139 Type 2 diabetes mellitus with other diabetic ophthalmic complication: Secondary | ICD-10-CM | POA: Diagnosis not present

## 2013-01-18 DIAGNOSIS — IMO0002 Reserved for concepts with insufficient information to code with codable children: Secondary | ICD-10-CM | POA: Diagnosis not present

## 2013-01-18 DIAGNOSIS — Z7901 Long term (current) use of anticoagulants: Secondary | ICD-10-CM | POA: Diagnosis not present

## 2013-01-18 DIAGNOSIS — M48061 Spinal stenosis, lumbar region without neurogenic claudication: Secondary | ICD-10-CM | POA: Diagnosis not present

## 2013-01-18 DIAGNOSIS — I4891 Unspecified atrial fibrillation: Secondary | ICD-10-CM | POA: Diagnosis not present

## 2013-01-25 ENCOUNTER — Other Ambulatory Visit: Payer: Self-pay | Admitting: Pulmonary Disease

## 2013-01-26 ENCOUNTER — Other Ambulatory Visit: Payer: Self-pay | Admitting: Pulmonary Disease

## 2013-01-26 ENCOUNTER — Telehealth: Payer: Self-pay | Admitting: Pulmonary Disease

## 2013-01-26 NOTE — Telephone Encounter (Signed)
Auto CPAP 12/20/12 to 01/02/13 >> Used on 11 of 14 nights with average 6 hrs 20 min.  Average AHI 17 with median CPAP 10 cm H2O and 95th percentile CPAP 12 cm H2O.  Mostly centrals.  Discussed results first with pt's wife, and then with pt.  Explained that his obstructive events are well controlled with CPAP, but he is still have some central events.  He feels he is sleeping well, and does not have trouble with daytime sleepiness.  Will continue to monitor his breathing and sleep pattern on current set up.

## 2013-01-30 ENCOUNTER — Other Ambulatory Visit: Payer: Self-pay | Admitting: Pulmonary Disease

## 2013-02-01 DIAGNOSIS — I4891 Unspecified atrial fibrillation: Secondary | ICD-10-CM | POA: Diagnosis not present

## 2013-02-01 DIAGNOSIS — Z7901 Long term (current) use of anticoagulants: Secondary | ICD-10-CM | POA: Diagnosis not present

## 2013-02-14 DIAGNOSIS — I4891 Unspecified atrial fibrillation: Secondary | ICD-10-CM | POA: Diagnosis not present

## 2013-02-14 DIAGNOSIS — D235 Other benign neoplasm of skin of trunk: Secondary | ICD-10-CM | POA: Diagnosis not present

## 2013-02-14 DIAGNOSIS — L570 Actinic keratosis: Secondary | ICD-10-CM | POA: Diagnosis not present

## 2013-02-14 DIAGNOSIS — M48061 Spinal stenosis, lumbar region without neurogenic claudication: Secondary | ICD-10-CM | POA: Diagnosis not present

## 2013-02-14 DIAGNOSIS — IMO0002 Reserved for concepts with insufficient information to code with codable children: Secondary | ICD-10-CM | POA: Diagnosis not present

## 2013-02-14 DIAGNOSIS — Z7901 Long term (current) use of anticoagulants: Secondary | ICD-10-CM | POA: Diagnosis not present

## 2013-02-27 DIAGNOSIS — I4891 Unspecified atrial fibrillation: Secondary | ICD-10-CM | POA: Diagnosis not present

## 2013-02-27 DIAGNOSIS — Z7901 Long term (current) use of anticoagulants: Secondary | ICD-10-CM | POA: Diagnosis not present

## 2013-03-05 DIAGNOSIS — L03039 Cellulitis of unspecified toe: Secondary | ICD-10-CM | POA: Diagnosis not present

## 2013-03-05 DIAGNOSIS — E1149 Type 2 diabetes mellitus with other diabetic neurological complication: Secondary | ICD-10-CM | POA: Diagnosis not present

## 2013-03-29 DIAGNOSIS — Z961 Presence of intraocular lens: Secondary | ICD-10-CM | POA: Diagnosis not present

## 2013-03-29 DIAGNOSIS — H26499 Other secondary cataract, unspecified eye: Secondary | ICD-10-CM | POA: Diagnosis not present

## 2013-03-29 DIAGNOSIS — E119 Type 2 diabetes mellitus without complications: Secondary | ICD-10-CM | POA: Diagnosis not present

## 2013-04-04 ENCOUNTER — Encounter: Payer: Self-pay | Admitting: Pulmonary Disease

## 2013-04-04 ENCOUNTER — Ambulatory Visit (INDEPENDENT_AMBULATORY_CARE_PROVIDER_SITE_OTHER): Payer: Medicare Other | Admitting: Pulmonary Disease

## 2013-04-04 VITALS — BP 124/70 | HR 74 | Temp 97.5°F | Ht 68.0 in | Wt 254.0 lb

## 2013-04-04 DIAGNOSIS — G4733 Obstructive sleep apnea (adult) (pediatric): Secondary | ICD-10-CM

## 2013-04-04 DIAGNOSIS — J438 Other emphysema: Secondary | ICD-10-CM | POA: Diagnosis not present

## 2013-04-04 DIAGNOSIS — J439 Emphysema, unspecified: Secondary | ICD-10-CM

## 2013-04-04 NOTE — Progress Notes (Signed)
Chief Complaint  Patient presents with  . Sleep Apnea    Currently using CPAP every night for 7-8 hours. Reports having leaking issues with his mask and feeling rested after wearing. Denies any problems with the machine or pressure.  [name] COPD    Breathing is unchanged. Reports DOE from time to time, coughing with production of clear mucus. Denies chest tightness.    History of Present Illness: [name] is a 79 y.o. male former smoker with COPD/Emphysema, and OSA.  His breathing is doing okay.  He gets occasional cough in and wheeze in the morning.  He denies sputum production.  He uses symbicort twice per day, and albuterol about 2 to 3 times per day.  His inhalers help.  He still gets winded if he does too much, but is okay at a steady pace.  He uses his CPAP every night.  He feels rested, and has no problems with his mask.  TESTS: PSG 06/14/06>>AHI 35.5  PFT 03/30/11>>FEV1 1.98(72%), FEV1% 68, DLCO 64%  PFT 04/19/12>>FEV1 1.65 (67%), FEV1% 63, TLC 5.66 (99%), DLCO 71%, no BD. Auto CPAP 12/20/12 to 01/02/13 >> Used on 11 of 14 nights with average 6 hrs 20 min. Average AHI 17 with median CPAP 10 cm H2O and 95th percentile CPAP 12 cm H2O. Mostly centrals.   He  has a past medical history of Dyspnea; OSA (obstructive sleep apnea); CAD (coronary artery disease); HTN (hypertension); DM (diabetes mellitus), type 2; Hyperlipidemia; Systolic congestive heart failure (2009); Atrial fibrillation; Hypothyroidism; PNA (pneumonia); Cleft palate; Nasal septal deviation; Iron deficiency anemia; GERD (gastroesophageal reflux disease); Benign prostatic hypertrophy; Nephrolithiasis; Psoriasis; Seborrheic keratosis; and COPD with emphysema (04/01/2010).  He  has past surgical history that includes Carpal tunnel release; Coronary artery bypass graft; left cleft palate and left cleft lip repair; Cataract extraction; c-spine surgery; and icd/BI-V insertion (10-08-08).   Outpatient Encounter Prescriptions  as of 04/04/2013  Medication Sig Dispense Refill  . albuterol (PROVENTIL HFA;VENTOLIN HFA) 108 (90 BASE) MCG/ACT inhaler Inhale 2 puffs into the lungs every 6 (six) hours as needed.      [name] albuterol (PROVENTIL) (2.5 MG/3ML) 0.083% nebulizer solution Take 3 mLs (2.5 mg total) by nebulization every 6 (six) hours as needed for wheezing.  300 mL  3  . albuterol (PROVENTIL) (2.5 MG/3ML) 0.083% nebulizer solution INHALE CONTENTS OF 1 VIAL BY NEBULIZER 4 TIMES A DAY AS DIRECTED  300 mL  3  . amiodarone (PACERONE) 200 MG tablet Take 200 mg by mouth daily.        [name] aspirin 81 MG tablet Take 81 mg by mouth daily.        [name] atorvastatin (LIPITOR) 80 MG tablet Take 80 mg by mouth daily.        . carvedilol (COREG) 12.5 MG tablet Take 12.5 mg by mouth 2 (two) times daily with a meal.        . furosemide (LASIX) 40 MG tablet Take 40 mg by mouth 2 (two) times daily.        . insulin aspart (NOVOLOG) 100 UNIT/ML injection 23 units before breakfast and dinner, 15 units before lunch      . insulin glargine (LANTUS SOLOSTAR) 100 UNIT/ML injection Inject 90 Units into the skin at bedtime.       [name] levothyroxine (SYNTHROID, LEVOTHROID) 75 MCG tablet Take 75 mcg by mouth daily.        . montelukast (SINGULAIR) 10 MG tablet TAKE 1 TABLET BY MOUTH AT BEDTIME  30  tablet  5  . Multiple Vitamin (MULTIVITAMIN) tablet Take 1 tablet by mouth daily.        [name] omeprazole (PRILOSEC) 20 MG capsule Take 20 mg by mouth daily.        [name] spironolactone (ALDACTONE) 25 MG tablet 1/2 tablet once a day       . SYMBICORT 160-4.5 MCG/ACT inhaler INHALE 2 PUFFS INTO THE LUNGS TWICE DAILY  1 Inhaler  6  . valsartan (DIOVAN) 80 MG tablet Take 80 mg by mouth daily.        [name] warfarin (COUMADIN) 5 MG tablet As directed per clinic       . [DISCONTINUED] albuterol (PROVENTIL) (2.5 MG/3ML) 0.083% nebulizer solution INHALE CONTENTS OF 1 VIAL BY NEBULIZER 4 TIMES A DAY AS DIRECTED  360 mL  3   No facility-administered encounter medications on file as of  04/04/2013.    No Known Allergies  Physical Exam:  General - No distress ENT - No sinus tenderness, no oral exudate, changes of cleft lip surger, nasal septal deviation, no LAN Cardiac - s1s2 regular, no murmur Chest - Decreased breath sounds, no wheeze/rales/dullness Back - No focal tenderness Abd - Soft, non-tender Ext - No edema Neuro - Normal strength Skin - No rashes Psych - normal mood, and behavior   Assessment/Plan:  Coralyn Helling, MD [place] [facility]/Critical Care/Sleep Pager:  [phone] 04/04/2013, 3:49 PM

## 2013-04-04 NOTE — Patient Instructions (Signed)
Follow up in 1 year Call if help needed sooner 

## 2013-04-04 NOTE — Assessment & Plan Note (Signed)
Stable on current regimen of symbicort and prn albuterol. 

## 2013-04-04 NOTE — Assessment & Plan Note (Signed)
He has been doing better with change to auto CPAP.  He recent download showed elevation of central apnea, but he is not having difficulty with his sleep.  Will continue current set up.

## 2013-04-10 DIAGNOSIS — Z7901 Long term (current) use of anticoagulants: Secondary | ICD-10-CM | POA: Diagnosis not present

## 2013-04-10 DIAGNOSIS — I4891 Unspecified atrial fibrillation: Secondary | ICD-10-CM | POA: Diagnosis not present

## 2013-05-08 DIAGNOSIS — N183 Chronic kidney disease, stage 3 unspecified: Secondary | ICD-10-CM | POA: Diagnosis not present

## 2013-05-08 DIAGNOSIS — I10 Essential (primary) hypertension: Secondary | ICD-10-CM | POA: Diagnosis not present

## 2013-05-08 DIAGNOSIS — J449 Chronic obstructive pulmonary disease, unspecified: Secondary | ICD-10-CM | POA: Diagnosis not present

## 2013-05-08 DIAGNOSIS — E1139 Type 2 diabetes mellitus with other diabetic ophthalmic complication: Secondary | ICD-10-CM | POA: Diagnosis not present

## 2013-05-08 DIAGNOSIS — E1165 Type 2 diabetes mellitus with hyperglycemia: Secondary | ICD-10-CM | POA: Diagnosis not present

## 2013-05-09 DIAGNOSIS — Z7901 Long term (current) use of anticoagulants: Secondary | ICD-10-CM | POA: Diagnosis not present

## 2013-05-09 DIAGNOSIS — I2510 Atherosclerotic heart disease of native coronary artery without angina pectoris: Secondary | ICD-10-CM | POA: Diagnosis not present

## 2013-05-09 DIAGNOSIS — Z79899 Other long term (current) drug therapy: Secondary | ICD-10-CM | POA: Diagnosis not present

## 2013-05-09 DIAGNOSIS — I4891 Unspecified atrial fibrillation: Secondary | ICD-10-CM | POA: Diagnosis not present

## 2013-05-16 DIAGNOSIS — I2510 Atherosclerotic heart disease of native coronary artery without angina pectoris: Secondary | ICD-10-CM | POA: Diagnosis not present

## 2013-05-16 DIAGNOSIS — I10 Essential (primary) hypertension: Secondary | ICD-10-CM | POA: Diagnosis not present

## 2013-05-16 DIAGNOSIS — Z7901 Long term (current) use of anticoagulants: Secondary | ICD-10-CM | POA: Diagnosis not present

## 2013-05-16 DIAGNOSIS — G4733 Obstructive sleep apnea (adult) (pediatric): Secondary | ICD-10-CM | POA: Diagnosis not present

## 2013-05-16 DIAGNOSIS — I4891 Unspecified atrial fibrillation: Secondary | ICD-10-CM | POA: Diagnosis not present

## 2013-05-16 DIAGNOSIS — I5022 Chronic systolic (congestive) heart failure: Secondary | ICD-10-CM | POA: Diagnosis not present

## 2013-05-16 DIAGNOSIS — E785 Hyperlipidemia, unspecified: Secondary | ICD-10-CM | POA: Diagnosis not present

## 2013-05-21 DIAGNOSIS — E1159 Type 2 diabetes mellitus with other circulatory complications: Secondary | ICD-10-CM | POA: Diagnosis not present

## 2013-06-06 DIAGNOSIS — Z7901 Long term (current) use of anticoagulants: Secondary | ICD-10-CM | POA: Diagnosis not present

## 2013-06-06 DIAGNOSIS — I4891 Unspecified atrial fibrillation: Secondary | ICD-10-CM | POA: Diagnosis not present

## 2013-06-15 ENCOUNTER — Other Ambulatory Visit: Payer: Self-pay | Admitting: Pulmonary Disease

## 2013-06-22 DIAGNOSIS — Z7901 Long term (current) use of anticoagulants: Secondary | ICD-10-CM | POA: Diagnosis not present

## 2013-06-22 DIAGNOSIS — I4891 Unspecified atrial fibrillation: Secondary | ICD-10-CM | POA: Diagnosis not present

## 2013-07-20 DIAGNOSIS — Z7901 Long term (current) use of anticoagulants: Secondary | ICD-10-CM | POA: Diagnosis not present

## 2013-07-20 DIAGNOSIS — I4891 Unspecified atrial fibrillation: Secondary | ICD-10-CM | POA: Diagnosis not present

## 2013-07-24 ENCOUNTER — Other Ambulatory Visit: Payer: Self-pay | Admitting: Pulmonary Disease

## 2013-07-25 ENCOUNTER — Other Ambulatory Visit: Payer: Self-pay | Admitting: General Surgery

## 2013-07-26 ENCOUNTER — Other Ambulatory Visit: Payer: Self-pay | Admitting: Cardiology

## 2013-08-13 DIAGNOSIS — B351 Tinea unguium: Secondary | ICD-10-CM | POA: Diagnosis not present

## 2013-08-13 DIAGNOSIS — E1159 Type 2 diabetes mellitus with other circulatory complications: Secondary | ICD-10-CM | POA: Diagnosis not present

## 2013-08-13 DIAGNOSIS — M79609 Pain in unspecified limb: Secondary | ICD-10-CM | POA: Diagnosis not present

## 2013-08-14 DIAGNOSIS — Z23 Encounter for immunization: Secondary | ICD-10-CM | POA: Diagnosis not present

## 2013-08-21 ENCOUNTER — Ambulatory Visit (INDEPENDENT_AMBULATORY_CARE_PROVIDER_SITE_OTHER): Payer: Medicare Other | Admitting: Pharmacist

## 2013-08-21 ENCOUNTER — Encounter (INDEPENDENT_AMBULATORY_CARE_PROVIDER_SITE_OTHER): Payer: Self-pay

## 2013-08-21 DIAGNOSIS — Z7901 Long term (current) use of anticoagulants: Secondary | ICD-10-CM

## 2013-08-21 DIAGNOSIS — I4891 Unspecified atrial fibrillation: Secondary | ICD-10-CM

## 2013-08-21 LAB — POCT INR: INR: 2.4

## 2013-08-25 ENCOUNTER — Other Ambulatory Visit: Payer: Self-pay | Admitting: Pulmonary Disease

## 2013-08-25 ENCOUNTER — Other Ambulatory Visit: Payer: Self-pay | Admitting: Cardiology

## 2013-09-04 DIAGNOSIS — M1710 Unilateral primary osteoarthritis, unspecified knee: Secondary | ICD-10-CM | POA: Diagnosis not present

## 2013-09-05 ENCOUNTER — Telehealth: Payer: Self-pay | Admitting: Interventional Cardiology

## 2013-09-05 NOTE — Telephone Encounter (Signed)
Returned call to patient he stated he needed a steroid injection in back and needs to know if ok to hold coumadin and if ok when does he need to hold coumadin.Message sent to Dr.Varanasi for advice.

## 2013-09-05 NOTE — Telephone Encounter (Signed)
New problem   Pt need to have shot in back and want to know have Dr Alvester Morin called the office. Please call pt he need to get off his blood thinners.

## 2013-09-06 ENCOUNTER — Telehealth: Payer: Self-pay | Admitting: Cardiology

## 2013-09-06 NOTE — Telephone Encounter (Signed)
Received request from Nurse fax box, documents faxed for surgical clearance. To: [employer] Fax number: [phone] Attention: 09/06/13/KM

## 2013-09-07 NOTE — Telephone Encounter (Signed)
Ok to hold for 5 days

## 2013-09-10 NOTE — Telephone Encounter (Signed)
Follow Up   Pt calling to follow up on having the coumadin held// please call back to confirm with pt or have the confirmation faxed to Dr. Campbell Hill Blas office// (no fax # provided)

## 2013-09-10 NOTE — Telephone Encounter (Signed)
Pt notified to hold coumadin 5 days prior to injection. Pt will start back the day after injection. To [place]. Dr. Alvester Morin will be giving the injection.

## 2013-09-11 DIAGNOSIS — E1129 Type 2 diabetes mellitus with other diabetic kidney complication: Secondary | ICD-10-CM | POA: Diagnosis not present

## 2013-09-11 DIAGNOSIS — E7800 Pure hypercholesterolemia, unspecified: Secondary | ICD-10-CM | POA: Diagnosis not present

## 2013-09-11 DIAGNOSIS — I10 Essential (primary) hypertension: Secondary | ICD-10-CM | POA: Diagnosis not present

## 2013-09-11 DIAGNOSIS — K219 Gastro-esophageal reflux disease without esophagitis: Secondary | ICD-10-CM | POA: Diagnosis not present

## 2013-09-11 DIAGNOSIS — N183 Chronic kidney disease, stage 3 unspecified: Secondary | ICD-10-CM | POA: Diagnosis not present

## 2013-09-11 DIAGNOSIS — E1139 Type 2 diabetes mellitus with other diabetic ophthalmic complication: Secondary | ICD-10-CM | POA: Diagnosis not present

## 2013-09-11 NOTE — Telephone Encounter (Signed)
Patient having epidural on 09/18/13 with Dr. Alvester Morin.  Will hold warfarin 5 days prior, and will check INR here the morning of 09/18/13 to confirm INR is low enough to proceed.  Appointment made.  Faxing note to Dr. Alvester Morin at [phone].

## 2013-09-18 ENCOUNTER — Ambulatory Visit (INDEPENDENT_AMBULATORY_CARE_PROVIDER_SITE_OTHER): Payer: Medicare Other | Admitting: *Deleted

## 2013-09-18 DIAGNOSIS — Z7901 Long term (current) use of anticoagulants: Secondary | ICD-10-CM

## 2013-09-18 DIAGNOSIS — I4891 Unspecified atrial fibrillation: Secondary | ICD-10-CM

## 2013-09-18 DIAGNOSIS — IMO0002 Reserved for concepts with insufficient information to code with codable children: Secondary | ICD-10-CM | POA: Diagnosis not present

## 2013-09-18 DIAGNOSIS — Z5181 Encounter for therapeutic drug level monitoring: Secondary | ICD-10-CM | POA: Diagnosis not present

## 2013-09-18 LAB — POCT INR: INR: 1.1

## 2013-09-27 DIAGNOSIS — Z85828 Personal history of other malignant neoplasm of skin: Secondary | ICD-10-CM | POA: Diagnosis not present

## 2013-09-27 DIAGNOSIS — L570 Actinic keratosis: Secondary | ICD-10-CM | POA: Diagnosis not present

## 2013-10-02 ENCOUNTER — Encounter: Payer: Self-pay | Admitting: Interventional Cardiology

## 2013-10-02 ENCOUNTER — Encounter (INDEPENDENT_AMBULATORY_CARE_PROVIDER_SITE_OTHER): Payer: Self-pay

## 2013-10-02 ENCOUNTER — Encounter: Payer: Self-pay | Admitting: Internal Medicine

## 2013-10-02 ENCOUNTER — Ambulatory Visit (INDEPENDENT_AMBULATORY_CARE_PROVIDER_SITE_OTHER): Payer: Medicare Other | Admitting: Interventional Cardiology

## 2013-10-02 ENCOUNTER — Ambulatory Visit (INDEPENDENT_AMBULATORY_CARE_PROVIDER_SITE_OTHER): Payer: Medicare Other | Admitting: *Deleted

## 2013-10-02 VITALS — BP 128/64 | HR 64 | Ht 68.0 in | Wt 253.0 lb

## 2013-10-02 DIAGNOSIS — I6529 Occlusion and stenosis of unspecified carotid artery: Secondary | ICD-10-CM

## 2013-10-02 DIAGNOSIS — I4891 Unspecified atrial fibrillation: Secondary | ICD-10-CM

## 2013-10-02 DIAGNOSIS — Z7901 Long term (current) use of anticoagulants: Secondary | ICD-10-CM

## 2013-10-02 DIAGNOSIS — I739 Peripheral vascular disease, unspecified: Secondary | ICD-10-CM

## 2013-10-02 DIAGNOSIS — I5022 Chronic systolic (congestive) heart failure: Secondary | ICD-10-CM

## 2013-10-02 LAB — MDC_IDC_ENUM_SESS_TYPE_INCLINIC
Battery Voltage: 2.62 V
Brady Statistic AP VP Percent: 98.46 %
Brady Statistic AP VS Percent: 0.03 %
Brady Statistic AS VP Percent: 1.51 %
Brady Statistic AS VS Percent: 0.01 %
Brady Statistic RA Percent Paced: 98.49 %
Brady Statistic RV Percent Paced: 99.97 %
Date Time Interrogation Session: 20141209103747
HighPow Impedance: 56 Ohm
HighPow Impedance: 78 Ohm
Lead Channel Impedance Value: 576 Ohm
Lead Channel Impedance Value: 696 Ohm
Lead Channel Pacing Threshold Amplitude: 1 V
Lead Channel Pacing Threshold Amplitude: 1 V
Lead Channel Pacing Threshold Amplitude: 1 V
Lead Channel Pacing Threshold Pulse Width: 0.4 ms
Lead Channel Pacing Threshold Pulse Width: 0.6 ms
Lead Channel Pacing Threshold Pulse Width: 0.6 ms
Lead Channel Sensing Intrinsic Amplitude: 3.2102
Lead Channel Sensing Intrinsic Amplitude: 8.5002
Lead Channel Setting Pacing Amplitude: 1.5 V
Lead Channel Setting Pacing Amplitude: 2 V
Lead Channel Setting Pacing Amplitude: 2.5 V
Lead Channel Setting Pacing Pulse Width: 0.6 ms
Lead Channel Setting Pacing Pulse Width: 0.6 ms
Lead Channel Setting Sensing Sensitivity: 0.3 mV
Zone Setting Detection Interval: 290 ms
Zone Setting Detection Interval: 340 ms
Zone Setting Detection Interval: 350 ms
Zone Setting Detection Interval: 400 ms

## 2013-10-02 LAB — POCT INR: INR: 1.9

## 2013-10-02 NOTE — Progress Notes (Signed)
CRT-D check in clinic. Device reached RRT on 09-29-13. Normal device function. changed VT NID from 28 to 32. turned off alert tones for RRT. Pt scheduled with JA on 10-03-13 @ 1015.

## 2013-10-02 NOTE — Patient Instructions (Addendum)
Your physician recommends that you continue on your current medications as directed. Please refer to the Current Medication list given to you today.  Your physician wants you to follow-up in: 1 year with Dr. Eldridge Dace. You will receive a reminder letter in the mail two months in advance. If you don't receive a letter, please call our office to schedule the follow-up appointment.  Schedule appt for Defib check asap.

## 2013-10-02 NOTE — Progress Notes (Signed)
Patient ID: [name], male   DOB: 12-30-1933, 79 y.o.   MRN: [id number]    [address] [place], [place]  [address] Phone: [phone] Fax:  [phone]  Date:  10/02/2013   ID:  [name], DOB 01-05-1934, MRN [id number]  PCP:  Lillia Mountain, MD      History of Present Illness: [name] is a 79 y.o. male  who has CAD and COPD. He has a left subclavian stenosis. No arm claudication or numbness or tingling. Has persistent SHOB. He thinks this is rellated to his emphysema. he also has chronic back pain which limits his ability to exercise.   No lightheadedness or syncope. No problems such as this while using his left arm.    Vital Signs      Wt Readings from Last 3 Encounters:  10/02/13 253 lb (114.76 kg)  04/04/13 254 lb (115.214 kg)  12/14/12 249 lb (112.946 kg)     Past Medical History  Diagnosis Date  . Dyspnea   . OSA (obstructive sleep apnea)   . CAD (coronary artery disease)   . HTN (hypertension)   . DM (diabetes mellitus), type 2   . Hyperlipidemia   . Systolic congestive heart failure 2009    s/p BiV ICD implantation by Dr Amil Amen (MDT)  . Atrial fibrillation     persistent, previously seen at [facility] and placed on amiodarone  . Hypothyroidism   . PNA (pneumonia)   . Cleft palate   . Nasal septal deviation   . Iron deficiency anemia   . GERD (gastroesophageal reflux disease)   . Benign prostatic hypertrophy   . Nephrolithiasis   . Psoriasis   . Seborrheic keratosis   . COPD with emphysema 04/01/2010    Current Outpatient Prescriptions  Medication Sig Dispense Refill  . albuterol (PROVENTIL HFA;VENTOLIN HFA) 108 (90 BASE) MCG/ACT inhaler Inhale 2 puffs into the lungs every 6 (six) hours as needed.      [name] albuterol (PROVENTIL) (2.5 MG/3ML) 0.083% nebulizer solution Take 3 mLs (2.5 mg total) by nebulization every 6 (six) hours as needed for wheezing.  300 mL  3  . albuterol (PROVENTIL) (2.5 MG/3ML) 0.083% nebulizer  solution USE 1 VIAL VIA NEBULIZER 4 TIMES A DAY AS DIRECTED  300 mL  3  . amiodarone (PACERONE) 200 MG tablet Take 200 mg by mouth daily.        [name] aspirin 81 MG tablet Take 81 mg by mouth daily.        [name] atorvastatin (LIPITOR) 80 MG tablet Take 80 mg by mouth daily.        . carvedilol (COREG) 12.5 MG tablet Take 12.5 mg by mouth 2 (two) times daily with a meal.        . furosemide (LASIX) 40 MG tablet TAKE 1 TABLET BY MOUTH TWICE A DAY  60 tablet  6  . HYDROcodone-acetaminophen (NORCO/VICODIN) 5-325 MG per tablet Take 1 tablet by mouth as needed.      . insulin aspart (NOVOLOG) 100 UNIT/ML injection 23 units before breakfast and dinner, 15 units before lunch      . insulin glargine (LANTUS SOLOSTAR) 100 UNIT/ML injection Inject 90 Units into the skin at bedtime.       [name] levothyroxine (SYNTHROID, LEVOTHROID) 75 MCG tablet Take 75 mcg by mouth daily.        . montelukast (SINGULAIR) 10 MG tablet TAKE 1 TABLET BY MOUTH AT BEDTIME  30 tablet  5  . Multiple Vitamin (MULTIVITAMIN) tablet Take 1 tablet by mouth daily.        [name] omeprazole (PRILOSEC) 20 MG capsule Take 20 mg by mouth daily.        [name] spironolactone (ALDACTONE) 25 MG tablet 1/2 tablet once a day       . SYMBICORT 160-4.5 MCG/ACT inhaler INHALE 2 PUFFS IN THE LUNGS TWICE A DAY  10.2 g  3  . valsartan (DIOVAN) 80 MG tablet Take 80 mg by mouth daily.        [name] warfarin (COUMADIN) 5 MG tablet TAKE 1 TABLET BY MOUTH ONCE A DAY  30 tablet  2  . ZETIA 10 MG tablet TAKE 1 TABLET ONCE A DAY ORALLY  30 tablet  11   No current facility-administered medications for this visit.    Allergies:   No Known Allergies  Social History:  The patient  reports that he quit smoking about 14 years ago. His smoking use included Cigarettes. He has a 195 pack-year smoking history. He has never used smokeless tobacco. He reports that he does not drink alcohol or use illicit drugs.   Family History:  The patient's family history includes Asthma in his father;  Heart disease in his brother; Stroke in his father.   ROS:  Please see the history of present illness.  No nausea, vomiting.  No fevers, chills.  No focal weakness.  No dysuria.    All other systems reviewed and negative.   PHYSICAL EXAM: VS:  BP 128/64  Pulse 64  Ht 5\' 8"  (1.727 m)  Wt 253 lb (114.76 kg)  BMI 38.48 kg/m2 Well nourished, well developed, in no acute distress HEENT: normal Neck: no JVD, no carotid bruits Cardiac:  normal S1, S2; RRR;  Lungs:  clear to auscultation bilaterally, no wheezing, rhonchi or rales Abd: soft, nontender, no hepatomegaly Ext: no edema; Absent left radial pulse, trace left brachial pulse. Discolored lower chairman the skin. Skin: warm and dry Neuro:   no focal abnormalities noted      ASSESSMENT AND PLAN:  Atrial fibrillation  Diagnostic Imaging:EKG12/03/2012 08:55:59 AM > Dual chamber pacing  COumadin for stroke prevention.  2. Encounter for long-term (current) use of anticoagulants  INR to be checked and coumadin dose to be adjusted with PharmD.  3. Peripheral artery disease  No arm claudication or angina despite likely subclavian stenosis. COnsider CTA of left subclavian if sx arise. circulation in LIMA to LAD could be compromised by subclavian stenosis. He does also to look for any chest discomfort or anginal equivalent as this may be an indication for subclavian intervention as well.   4. Carotid artery disease: Moderate bilateral internal carotid disease. Signed, Fredric Mare, MD, [facility] 10/02/2013 9:36 AM

## 2013-10-03 ENCOUNTER — Ambulatory Visit (INDEPENDENT_AMBULATORY_CARE_PROVIDER_SITE_OTHER): Payer: Medicare Other | Admitting: Internal Medicine

## 2013-10-03 ENCOUNTER — Ambulatory Visit (HOSPITAL_COMMUNITY): Payer: Medicare Other | Attending: Internal Medicine | Admitting: Radiology

## 2013-10-03 ENCOUNTER — Other Ambulatory Visit: Payer: Self-pay

## 2013-10-03 ENCOUNTER — Encounter: Payer: Self-pay | Admitting: *Deleted

## 2013-10-03 ENCOUNTER — Encounter: Payer: Self-pay | Admitting: Internal Medicine

## 2013-10-03 VITALS — BP 104/62 | HR 76 | Ht 68.0 in | Wt 250.8 lb

## 2013-10-03 DIAGNOSIS — I5022 Chronic systolic (congestive) heart failure: Secondary | ICD-10-CM

## 2013-10-03 DIAGNOSIS — I2510 Atherosclerotic heart disease of native coronary artery without angina pectoris: Secondary | ICD-10-CM | POA: Insufficient documentation

## 2013-10-03 DIAGNOSIS — I10 Essential (primary) hypertension: Secondary | ICD-10-CM

## 2013-10-03 DIAGNOSIS — I2589 Other forms of chronic ischemic heart disease: Secondary | ICD-10-CM | POA: Diagnosis not present

## 2013-10-03 DIAGNOSIS — I059 Rheumatic mitral valve disease, unspecified: Secondary | ICD-10-CM | POA: Insufficient documentation

## 2013-10-03 DIAGNOSIS — J449 Chronic obstructive pulmonary disease, unspecified: Secondary | ICD-10-CM | POA: Insufficient documentation

## 2013-10-03 DIAGNOSIS — I255 Ischemic cardiomyopathy: Secondary | ICD-10-CM

## 2013-10-03 DIAGNOSIS — R0609 Other forms of dyspnea: Secondary | ICD-10-CM | POA: Insufficient documentation

## 2013-10-03 DIAGNOSIS — Z87891 Personal history of nicotine dependence: Secondary | ICD-10-CM | POA: Insufficient documentation

## 2013-10-03 DIAGNOSIS — R0989 Other specified symptoms and signs involving the circulatory and respiratory systems: Secondary | ICD-10-CM | POA: Insufficient documentation

## 2013-10-03 DIAGNOSIS — E785 Hyperlipidemia, unspecified: Secondary | ICD-10-CM | POA: Insufficient documentation

## 2013-10-03 DIAGNOSIS — I4891 Unspecified atrial fibrillation: Secondary | ICD-10-CM | POA: Insufficient documentation

## 2013-10-03 DIAGNOSIS — J4489 Other specified chronic obstructive pulmonary disease: Secondary | ICD-10-CM | POA: Insufficient documentation

## 2013-10-03 NOTE — Patient Instructions (Addendum)
Your physician recommends that you schedule a follow-up appointment in: with device clinic for a wound check around 11/16/13   Your physician has requested that you have an echocardiogram. Echocardiography is a painless test that uses sound waves to create images of your heart. It provides your doctor with information about the size and shape of your heart and how well your heart's chambers and valves are working. This procedure takes approximately one hour. There are no restrictions for this procedure.  Will have generator changed out ----see instruction sheet

## 2013-10-04 NOTE — Progress Notes (Signed)
Echocardiogram performed.  

## 2013-10-07 NOTE — Progress Notes (Signed)
[name] Mountain, MD: Primary Cardiologist:  Dr Dell Ponto is a 79 y.o. male with a h/o CAD, chronic systolic dysfunction and persistent afib sp BiV ICD (MDT) by Dr Amil Amen who presents today for follow-up in the Electrophysiology device clinic.   The patient reports doing very well since having a BiV ICD implanted and remains reasonably active despite his age.  He has reached elective replacement indicator.  He has chronic stable SOB.  He reports that activity is limited by backpain chronically.  Today, he  denies symptoms of palpitations, chest pain,   lower extremity edema, dizziness, presyncope, syncope, or neurologic sequela.  The patientis tolerating medications without difficulties and is otherwise without complaint today.   Past Medical History  Diagnosis Date  . Dyspnea   . OSA (obstructive sleep apnea)   . CAD (coronary artery disease)   . HTN (hypertension)   . DM (diabetes mellitus), type 2   . Hyperlipidemia   . Systolic congestive heart failure 2009    s/p BiV ICD implantation by Dr Amil Amen (MDT)  . Atrial fibrillation     persistent, previously seen at [facility] and placed on amiodarone  . Hypothyroidism   . PNA (pneumonia)   . Cleft palate   . Nasal septal deviation   . Iron deficiency anemia   . GERD (gastroesophageal reflux disease)   . Benign prostatic hypertrophy   . Nephrolithiasis   . Psoriasis   . Seborrheic keratosis   . COPD with emphysema 04/01/2010   Past Surgical History  Procedure Laterality Date  . Carpal tunnel release    . Coronary artery bypass graft    . Left cleft palate and left cleft lip repair    . Cataract extraction    . C-spine surgery    . Icd/bi-v insertion  10-08-08    Dr Amil Amen (MDT) implant for primary prevention    History   Social History  . Marital Status: Married    Spouse Name: N/A    Number of Children: N/A  . Years of Education: N/A   Occupational History  . retired     Academic librarian   Social  History Main Topics  . Smoking status: Former Smoker -- 3.00 packs/day for 65 years    Types: Cigarettes    Quit date: 10/25/1998  . Smokeless tobacco: Never Used  . Alcohol Use: No  . Drug Use: No  . Sexual Activity: Not on file   Other Topics Concern  . Not on file   Social History Narrative   Lives [place]   Retired    Family History  Problem Relation Age of Onset  . Asthma Father   . Heart disease Brother   . Stroke Father     No Known Allergies  Current Outpatient Prescriptions  Medication Sig Dispense Refill  . albuterol (PROVENTIL HFA;VENTOLIN HFA) 108 (90 BASE) MCG/ACT inhaler Inhale 2 puffs into the lungs every 6 (six) hours as needed.      [name] albuterol (PROVENTIL) (2.5 MG/3ML) 0.083% nebulizer solution Take 3 mLs (2.5 mg total) by nebulization every 6 (six) hours as needed for wheezing.  300 mL  3  . amiodarone (PACERONE) 200 MG tablet Take 200 mg by mouth daily.        [name] aspirin 81 MG tablet Take 81 mg by mouth daily.        [name] atorvastatin (LIPITOR) 80 MG tablet Take 80 mg by mouth daily.        . carvedilol (  COREG) 12.5 MG tablet Take 12.5 mg by mouth 2 (two) times daily with a meal.        . furosemide (LASIX) 40 MG tablet TAKE 1 TABLET BY MOUTH TWICE A DAY  60 tablet  6  . HYDROcodone-acetaminophen (NORCO/VICODIN) 5-325 MG per tablet Take 1 tablet by mouth as needed.      . insulin aspart (NOVOLOG) 100 UNIT/ML injection 23 units before breakfast and dinner, 15 units before lunch      . insulin glargine (LANTUS SOLOSTAR) 100 UNIT/ML injection Inject 90 Units into the skin at bedtime.       [name] levothyroxine (SYNTHROID, LEVOTHROID) 75 MCG tablet Take 75 mcg by mouth daily.        . montelukast (SINGULAIR) 10 MG tablet TAKE 1 TABLET BY MOUTH AT BEDTIME  30 tablet  5  . Multiple Vitamin (MULTIVITAMIN) tablet Take 1 tablet by mouth daily.        [name] omeprazole (PRILOSEC) 20 MG capsule Take 20 mg by mouth daily.        [name] spironolactone (ALDACTONE) 25 MG tablet 1/2  tablet once a day       . SYMBICORT 160-4.5 MCG/ACT inhaler INHALE 2 PUFFS IN THE LUNGS TWICE A DAY  10.2 g  3  . valsartan (DIOVAN) 80 MG tablet Take 80 mg by mouth daily.        [name] warfarin (COUMADIN) 5 MG tablet TAKE 1 TABLET BY MOUTH ONCE A DAY  30 tablet  2  . ZETIA 10 MG tablet TAKE 1 TABLET ONCE A DAY ORALLY  30 tablet  11   No current facility-administered medications for this visit.    ROS- all systems are reviewed and negative except as per HPI  Physical Exam: Filed Vitals:   10/03/13 1045  BP: 104/62  Pulse: 76  Height: 5\' 8"  (1.727 m)  Weight: 250 lb 12.8 oz (113.762 kg)    GEN- The patient is elderly appearing, alert and oriented x 3 today.   Head- normocephalic, atraumatic Eyes-  Sclera clear, conjunctiva pink Ears- hearing intact Oropharynx- clear Neck- supple, no JVP Lungs- Clear to ausculation bilaterally, normal work of breathing Chest- BiV ICD pocket is well healed Heart- Regular rate and rhythm, no murmurs, rubs or gallops, PMI not laterally displaced GI- soft, NT, ND, + BS Extremities- no clubbing, cyanosis, 1+ edema  ICD interrogation- reviewed in detail today,  See PACEART report ekg today reveals   Assessment and Plan:  1. Chronic systolic dysfunction The patient has received clinical benefit from CRT I will repeat an echo as it has been several years since his last echo. He has reached ERI battery status. Risks, benefits, and alternatives to BiV ICD pulse generator replacement were discussed with the patient at length today.  He wishes to proceed.  We will schedule the procedure at the next available time.  2. Afib Stable Continue long term anticoagulation with coumadin (CHADS2VASC score is at least 6).  Consider stopping ASA. I will defer this decision to Dr Eldridge Dace.  3. HTN Stable No change required today

## 2013-10-18 DIAGNOSIS — I10 Essential (primary) hypertension: Secondary | ICD-10-CM | POA: Diagnosis not present

## 2013-10-18 DIAGNOSIS — S2249XA Multiple fractures of ribs, unspecified side, initial encounter for closed fracture: Secondary | ICD-10-CM | POA: Diagnosis not present

## 2013-10-18 DIAGNOSIS — E785 Hyperlipidemia, unspecified: Secondary | ICD-10-CM | POA: Diagnosis present

## 2013-10-18 DIAGNOSIS — I252 Old myocardial infarction: Secondary | ICD-10-CM | POA: Diagnosis not present

## 2013-10-18 DIAGNOSIS — Z7901 Long term (current) use of anticoagulants: Secondary | ICD-10-CM | POA: Diagnosis not present

## 2013-10-18 DIAGNOSIS — G4733 Obstructive sleep apnea (adult) (pediatric): Secondary | ICD-10-CM | POA: Diagnosis present

## 2013-10-18 DIAGNOSIS — Z794 Long term (current) use of insulin: Secondary | ICD-10-CM | POA: Diagnosis not present

## 2013-10-18 DIAGNOSIS — E039 Hypothyroidism, unspecified: Secondary | ICD-10-CM | POA: Diagnosis present

## 2013-10-18 DIAGNOSIS — E119 Type 2 diabetes mellitus without complications: Secondary | ICD-10-CM | POA: Diagnosis not present

## 2013-10-18 DIAGNOSIS — E875 Hyperkalemia: Secondary | ICD-10-CM | POA: Diagnosis present

## 2013-10-18 DIAGNOSIS — R29818 Other symptoms and signs involving the nervous system: Secondary | ICD-10-CM | POA: Diagnosis not present

## 2013-10-18 DIAGNOSIS — I2510 Atherosclerotic heart disease of native coronary artery without angina pectoris: Secondary | ICD-10-CM | POA: Diagnosis present

## 2013-10-18 DIAGNOSIS — R4789 Other speech disturbances: Secondary | ICD-10-CM | POA: Diagnosis not present

## 2013-10-18 DIAGNOSIS — R5381 Other malaise: Secondary | ICD-10-CM | POA: Diagnosis not present

## 2013-10-18 DIAGNOSIS — Z6837 Body mass index (BMI) 37.0-37.9, adult: Secondary | ICD-10-CM | POA: Diagnosis not present

## 2013-10-18 DIAGNOSIS — J449 Chronic obstructive pulmonary disease, unspecified: Secondary | ICD-10-CM | POA: Diagnosis present

## 2013-10-18 DIAGNOSIS — I509 Heart failure, unspecified: Secondary | ICD-10-CM | POA: Diagnosis present

## 2013-10-18 DIAGNOSIS — M6281 Muscle weakness (generalized): Secondary | ICD-10-CM | POA: Diagnosis present

## 2013-10-18 DIAGNOSIS — I6529 Occlusion and stenosis of unspecified carotid artery: Secondary | ICD-10-CM | POA: Diagnosis not present

## 2013-10-18 DIAGNOSIS — R471 Dysarthria and anarthria: Secondary | ICD-10-CM | POA: Diagnosis present

## 2013-10-18 DIAGNOSIS — R29810 Facial weakness: Secondary | ICD-10-CM | POA: Diagnosis not present

## 2013-10-18 DIAGNOSIS — I4891 Unspecified atrial fibrillation: Secondary | ICD-10-CM | POA: Diagnosis not present

## 2013-10-18 DIAGNOSIS — I059 Rheumatic mitral valve disease, unspecified: Secondary | ICD-10-CM | POA: Diagnosis not present

## 2013-10-18 DIAGNOSIS — I6789 Other cerebrovascular disease: Secondary | ICD-10-CM | POA: Diagnosis not present

## 2013-10-18 DIAGNOSIS — I6350 Cerebral infarction due to unspecified occlusion or stenosis of unspecified cerebral artery: Secondary | ICD-10-CM | POA: Diagnosis not present

## 2013-10-18 DIAGNOSIS — R29898 Other symptoms and signs involving the musculoskeletal system: Secondary | ICD-10-CM | POA: Diagnosis not present

## 2013-10-18 DIAGNOSIS — R0602 Shortness of breath: Secondary | ICD-10-CM | POA: Diagnosis not present

## 2013-10-18 DIAGNOSIS — E669 Obesity, unspecified: Secondary | ICD-10-CM | POA: Diagnosis not present

## 2013-10-18 DIAGNOSIS — G8190 Hemiplegia, unspecified affecting unspecified side: Secondary | ICD-10-CM | POA: Diagnosis not present

## 2013-10-18 DIAGNOSIS — Z87891 Personal history of nicotine dependence: Secondary | ICD-10-CM | POA: Diagnosis not present

## 2013-10-18 DIAGNOSIS — Z95810 Presence of automatic (implantable) cardiac defibrillator: Secondary | ICD-10-CM | POA: Diagnosis not present

## 2013-10-19 ENCOUNTER — Telehealth: Payer: Self-pay | Admitting: Cardiology

## 2013-10-19 NOTE — Telephone Encounter (Signed)
I did not call pt. 

## 2013-10-19 NOTE — Telephone Encounter (Signed)
New Message  Pt wife called/ States the pt has a had a stroke. She received a missed call from [facility] and she is not sure who called. Please assist

## 2013-10-22 ENCOUNTER — Telehealth: Payer: Self-pay | Admitting: Internal Medicine

## 2013-10-22 ENCOUNTER — Telehealth: Payer: Self-pay | Admitting: Pulmonary Disease

## 2013-10-22 NOTE — Telephone Encounter (Signed)
Follow up    Pt's wife returned our call.  Needs information about battery changes.

## 2013-10-22 NOTE — Telephone Encounter (Signed)
I spoke with wife & she is aware battery change was cancelled.   Her husband (pt) is being discharged today from the hospital in [name] & will be undergoing therapy.  States they will be home after the New Year but she can be reached at her mobile number to schedule battery change for pt later in January. Pt has fasting lab work scheduled for 11/12/13 & if possible would like to have pre-op labs done at that time.  Will forward this to [place] to call wife back this week regarding rescheduling battery change Mylo Red RN

## 2013-10-22 NOTE — Telephone Encounter (Signed)
New Message  Pt wife called Husband is at [facility] in [place],, He has recently had a stroke. She states that they are unable to get his coumadin level down.. It is currently at 1.5.. Pt wife is concerned requests a call back to discuss.

## 2013-10-22 NOTE — Telephone Encounter (Signed)
Spoke with caregiver at [facility] pt recently had a CVA and is being d/c on Coumadin.  Pt's INR today is 1.7.  Pt is being discharged today with [facility] services.  Pt will most likely be discharged on Lovenox and Coumadin with [facility] to check INR's and forward results to Coumadin clinic for management until pt returns to [facility] home and can come into clinic. Gave fax number [phone] to forward clinical discharge summary for clinic to review and have last INR results and Coumadin discharge dosage.    Spoke with pt's wife advised of conversation with discharging caregiver and plans for Lovenox and Coumadin management.  Pt's wife verbalizes understanding.  Pt is scheduled for generator changeout on 11/06/13 with Dr Johney Frame.  Due to recent CVA, holding Coumadin starting on 11/03/13 for changeout is not recommended.  Will forward message to Dr Johney Frame to make aware of recent CVA and for his recommendations on how to proceed.

## 2013-10-22 NOTE — Telephone Encounter (Signed)
New problem    June calling stating patient had a CVA recently . Patient was schedule for battery changed on  1/13 .    Patient will be in the area for another 2 more weeks. The procedure need to be push out . Please advise.     Will need to have Lovenox bridge.

## 2013-10-22 NOTE — Telephone Encounter (Signed)
F/u call to advise per Dr Cyril Mourning Neurologist covering CVA recommends postponing battery change till later this month.  I cancelled the battery change for 11/06/13 Pt needs to be Lovenox bridged when he is rescheduled. Please call wife on mobile number to reschedule for later in the month.

## 2013-10-22 NOTE — Telephone Encounter (Signed)
lmomtcb x1 

## 2013-10-23 ENCOUNTER — Ambulatory Visit (INDEPENDENT_AMBULATORY_CARE_PROVIDER_SITE_OTHER): Payer: Medicare Other | Admitting: Pharmacist

## 2013-10-23 DIAGNOSIS — E119 Type 2 diabetes mellitus without complications: Secondary | ICD-10-CM | POA: Diagnosis not present

## 2013-10-23 DIAGNOSIS — I4891 Unspecified atrial fibrillation: Secondary | ICD-10-CM

## 2013-10-23 DIAGNOSIS — I69959 Hemiplegia and hemiparesis following unspecified cerebrovascular disease affecting unspecified side: Secondary | ICD-10-CM | POA: Diagnosis not present

## 2013-10-23 DIAGNOSIS — I2510 Atherosclerotic heart disease of native coronary artery without angina pectoris: Secondary | ICD-10-CM | POA: Diagnosis not present

## 2013-10-23 DIAGNOSIS — I10 Essential (primary) hypertension: Secondary | ICD-10-CM | POA: Diagnosis not present

## 2013-10-23 DIAGNOSIS — R5381 Other malaise: Secondary | ICD-10-CM | POA: Diagnosis not present

## 2013-10-23 DIAGNOSIS — Z7901 Long term (current) use of anticoagulants: Secondary | ICD-10-CM

## 2013-10-23 LAB — POCT INR: INR: 2.3

## 2013-10-23 NOTE — Telephone Encounter (Signed)
I did not call. Please send to Dr Amedeo Plenty nurse

## 2013-10-23 NOTE — Telephone Encounter (Signed)
Follow Up  ° °Pt returned call//SR  °

## 2013-10-23 NOTE — Telephone Encounter (Signed)
I spoke with spouse and she reports pt was released last night. He was admitted bc he had a stroke. Pt has pending appt 12/21/13. FYI for Dr. Craige Cotta

## 2013-10-23 NOTE — Telephone Encounter (Signed)
[id number]. Roselee Tayloe, CMA  

## 2013-10-23 NOTE — Telephone Encounter (Signed)
Noted  

## 2013-10-24 DIAGNOSIS — I69959 Hemiplegia and hemiparesis following unspecified cerebrovascular disease affecting unspecified side: Secondary | ICD-10-CM | POA: Diagnosis not present

## 2013-10-24 DIAGNOSIS — I4891 Unspecified atrial fibrillation: Secondary | ICD-10-CM | POA: Diagnosis not present

## 2013-10-24 DIAGNOSIS — R5381 Other malaise: Secondary | ICD-10-CM | POA: Diagnosis not present

## 2013-10-24 DIAGNOSIS — E119 Type 2 diabetes mellitus without complications: Secondary | ICD-10-CM | POA: Diagnosis not present

## 2013-10-24 DIAGNOSIS — I10 Essential (primary) hypertension: Secondary | ICD-10-CM | POA: Diagnosis not present

## 2013-10-24 DIAGNOSIS — I2510 Atherosclerotic heart disease of native coronary artery without angina pectoris: Secondary | ICD-10-CM | POA: Diagnosis not present

## 2013-10-24 NOTE — Telephone Encounter (Signed)
I have rescheduled patient for the same day and wife is aware  Time is different

## 2013-10-25 ENCOUNTER — Other Ambulatory Visit: Payer: Self-pay | Admitting: *Deleted

## 2013-10-25 DIAGNOSIS — I2510 Atherosclerotic heart disease of native coronary artery without angina pectoris: Secondary | ICD-10-CM

## 2013-10-25 DIAGNOSIS — Z79899 Other long term (current) drug therapy: Secondary | ICD-10-CM

## 2013-10-26 ENCOUNTER — Ambulatory Visit (INDEPENDENT_AMBULATORY_CARE_PROVIDER_SITE_OTHER): Payer: Medicare Other | Admitting: Pharmacist

## 2013-10-26 ENCOUNTER — Telehealth: Payer: Self-pay | Admitting: Internal Medicine

## 2013-10-26 DIAGNOSIS — I4891 Unspecified atrial fibrillation: Secondary | ICD-10-CM | POA: Diagnosis not present

## 2013-10-26 DIAGNOSIS — E119 Type 2 diabetes mellitus without complications: Secondary | ICD-10-CM | POA: Diagnosis not present

## 2013-10-26 DIAGNOSIS — Z7901 Long term (current) use of anticoagulants: Secondary | ICD-10-CM

## 2013-10-26 DIAGNOSIS — I69959 Hemiplegia and hemiparesis following unspecified cerebrovascular disease affecting unspecified side: Secondary | ICD-10-CM | POA: Diagnosis not present

## 2013-10-26 DIAGNOSIS — R5381 Other malaise: Secondary | ICD-10-CM | POA: Diagnosis not present

## 2013-10-26 DIAGNOSIS — I2510 Atherosclerotic heart disease of native coronary artery without angina pectoris: Secondary | ICD-10-CM | POA: Diagnosis not present

## 2013-10-26 DIAGNOSIS — I10 Essential (primary) hypertension: Secondary | ICD-10-CM | POA: Diagnosis not present

## 2013-10-26 LAB — POCT INR: INR: 3.3

## 2013-10-26 NOTE — Telephone Encounter (Signed)
Received 6 pages from [facility], sent to Dr. Rayann Heman. 10/26/13/ss

## 2013-10-29 DIAGNOSIS — I10 Essential (primary) hypertension: Secondary | ICD-10-CM | POA: Diagnosis not present

## 2013-10-29 DIAGNOSIS — E119 Type 2 diabetes mellitus without complications: Secondary | ICD-10-CM | POA: Diagnosis not present

## 2013-10-29 DIAGNOSIS — R5381 Other malaise: Secondary | ICD-10-CM | POA: Diagnosis not present

## 2013-10-29 DIAGNOSIS — I4891 Unspecified atrial fibrillation: Secondary | ICD-10-CM | POA: Diagnosis not present

## 2013-10-29 DIAGNOSIS — I69959 Hemiplegia and hemiparesis following unspecified cerebrovascular disease affecting unspecified side: Secondary | ICD-10-CM | POA: Diagnosis not present

## 2013-10-29 DIAGNOSIS — I2510 Atherosclerotic heart disease of native coronary artery without angina pectoris: Secondary | ICD-10-CM | POA: Diagnosis not present

## 2013-10-30 ENCOUNTER — Ambulatory Visit (INDEPENDENT_AMBULATORY_CARE_PROVIDER_SITE_OTHER): Payer: Medicare Other | Admitting: Pharmacist

## 2013-10-30 ENCOUNTER — Ambulatory Visit: Payer: Medicare Other | Admitting: Pulmonary Disease

## 2013-10-30 DIAGNOSIS — I4891 Unspecified atrial fibrillation: Secondary | ICD-10-CM | POA: Diagnosis not present

## 2013-10-30 DIAGNOSIS — I10 Essential (primary) hypertension: Secondary | ICD-10-CM | POA: Diagnosis not present

## 2013-10-30 DIAGNOSIS — Z7901 Long term (current) use of anticoagulants: Secondary | ICD-10-CM

## 2013-10-30 DIAGNOSIS — I6789 Other cerebrovascular disease: Secondary | ICD-10-CM | POA: Insufficient documentation

## 2013-10-30 DIAGNOSIS — I2510 Atherosclerotic heart disease of native coronary artery without angina pectoris: Secondary | ICD-10-CM | POA: Diagnosis not present

## 2013-10-30 DIAGNOSIS — R5381 Other malaise: Secondary | ICD-10-CM | POA: Diagnosis not present

## 2013-10-30 DIAGNOSIS — E119 Type 2 diabetes mellitus without complications: Secondary | ICD-10-CM | POA: Diagnosis not present

## 2013-10-30 DIAGNOSIS — I69959 Hemiplegia and hemiparesis following unspecified cerebrovascular disease affecting unspecified side: Secondary | ICD-10-CM | POA: Diagnosis not present

## 2013-10-30 LAB — POCT INR: INR: 2

## 2013-11-01 ENCOUNTER — Other Ambulatory Visit (INDEPENDENT_AMBULATORY_CARE_PROVIDER_SITE_OTHER): Payer: Medicare Other

## 2013-11-01 ENCOUNTER — Ambulatory Visit (INDEPENDENT_AMBULATORY_CARE_PROVIDER_SITE_OTHER): Payer: Medicare Other | Admitting: *Deleted

## 2013-11-01 ENCOUNTER — Other Ambulatory Visit: Payer: Medicare Other

## 2013-11-01 DIAGNOSIS — I6789 Other cerebrovascular disease: Secondary | ICD-10-CM | POA: Diagnosis not present

## 2013-11-01 DIAGNOSIS — I4891 Unspecified atrial fibrillation: Secondary | ICD-10-CM

## 2013-11-01 DIAGNOSIS — Z79899 Other long term (current) drug therapy: Secondary | ICD-10-CM | POA: Diagnosis not present

## 2013-11-01 DIAGNOSIS — I2510 Atherosclerotic heart disease of native coronary artery without angina pectoris: Secondary | ICD-10-CM

## 2013-11-01 DIAGNOSIS — Z7901 Long term (current) use of anticoagulants: Secondary | ICD-10-CM | POA: Diagnosis not present

## 2013-11-01 DIAGNOSIS — E785 Hyperlipidemia, unspecified: Secondary | ICD-10-CM | POA: Diagnosis not present

## 2013-11-01 LAB — POCT INR: INR: 2.2

## 2013-11-01 LAB — ALT: ALT: 24 U/L (ref 0–53)

## 2013-11-02 ENCOUNTER — Encounter (HOSPITAL_COMMUNITY): Payer: Self-pay | Admitting: Pharmacy Technician

## 2013-11-02 LAB — NMR LIPOPROFILE WITH LIPIDS
Cholesterol, Total: 124 mg/dL (ref <200)
HDL Particle Number: 31.7 umol/L (ref >=30.5)
HDL Size: 9.4 nm (ref >=9.2)
HDL-C: 43 mg/dL (ref >=40)
LDL (calc): 62 mg/dL (ref <100)
LDL Particle Number: 865 nmol/L (ref <1000)
LDL Size: 20.4 nm — ABNORMAL LOW (ref >20.5)
LP-IR Score: 33 (ref <=45)
Large HDL-P: 2.6 umol/L — ABNORMAL LOW (ref >=4.8)
Large VLDL-P: 1 nmol/L (ref <=2.7)
Small LDL Particle Number: 407 nmol/L (ref <=527)
Triglycerides: 93 mg/dL (ref <150)
VLDL Size: 43.2 nm (ref <=46.6)

## 2013-11-05 ENCOUNTER — Other Ambulatory Visit: Payer: Self-pay | Admitting: General Surgery

## 2013-11-05 ENCOUNTER — Encounter: Payer: Self-pay | Admitting: General Surgery

## 2013-11-05 ENCOUNTER — Ambulatory Visit (INDEPENDENT_AMBULATORY_CARE_PROVIDER_SITE_OTHER): Payer: Medicare Other | Admitting: Pharmacist

## 2013-11-05 DIAGNOSIS — Z4502 Encounter for adjustment and management of automatic implantable cardiac defibrillator: Secondary | ICD-10-CM | POA: Diagnosis not present

## 2013-11-05 DIAGNOSIS — I2510 Atherosclerotic heart disease of native coronary artery without angina pectoris: Secondary | ICD-10-CM | POA: Diagnosis not present

## 2013-11-05 DIAGNOSIS — E039 Hypothyroidism, unspecified: Secondary | ICD-10-CM | POA: Diagnosis not present

## 2013-11-05 DIAGNOSIS — J438 Other emphysema: Secondary | ICD-10-CM | POA: Diagnosis not present

## 2013-11-05 DIAGNOSIS — Z8673 Personal history of transient ischemic attack (TIA), and cerebral infarction without residual deficits: Secondary | ICD-10-CM | POA: Diagnosis not present

## 2013-11-05 DIAGNOSIS — E785 Hyperlipidemia, unspecified: Secondary | ICD-10-CM

## 2013-11-05 DIAGNOSIS — I4891 Unspecified atrial fibrillation: Secondary | ICD-10-CM | POA: Diagnosis not present

## 2013-11-05 DIAGNOSIS — E119 Type 2 diabetes mellitus without complications: Secondary | ICD-10-CM | POA: Diagnosis not present

## 2013-11-05 DIAGNOSIS — Z7901 Long term (current) use of anticoagulants: Secondary | ICD-10-CM | POA: Diagnosis not present

## 2013-11-05 DIAGNOSIS — I6789 Other cerebrovascular disease: Secondary | ICD-10-CM | POA: Diagnosis not present

## 2013-11-05 DIAGNOSIS — Z794 Long term (current) use of insulin: Secondary | ICD-10-CM | POA: Diagnosis not present

## 2013-11-05 DIAGNOSIS — L408 Other psoriasis: Secondary | ICD-10-CM | POA: Diagnosis not present

## 2013-11-05 DIAGNOSIS — G4733 Obstructive sleep apnea (adult) (pediatric): Secondary | ICD-10-CM | POA: Diagnosis not present

## 2013-11-05 DIAGNOSIS — I5022 Chronic systolic (congestive) heart failure: Secondary | ICD-10-CM | POA: Diagnosis not present

## 2013-11-05 DIAGNOSIS — I509 Heart failure, unspecified: Secondary | ICD-10-CM | POA: Diagnosis not present

## 2013-11-05 DIAGNOSIS — I2589 Other forms of chronic ischemic heart disease: Secondary | ICD-10-CM | POA: Diagnosis not present

## 2013-11-05 DIAGNOSIS — Z7982 Long term (current) use of aspirin: Secondary | ICD-10-CM | POA: Diagnosis not present

## 2013-11-05 DIAGNOSIS — Z87891 Personal history of nicotine dependence: Secondary | ICD-10-CM | POA: Diagnosis not present

## 2013-11-05 DIAGNOSIS — K219 Gastro-esophageal reflux disease without esophagitis: Secondary | ICD-10-CM | POA: Diagnosis not present

## 2013-11-05 DIAGNOSIS — N400 Enlarged prostate without lower urinary tract symptoms: Secondary | ICD-10-CM | POA: Diagnosis not present

## 2013-11-05 DIAGNOSIS — I10 Essential (primary) hypertension: Secondary | ICD-10-CM | POA: Diagnosis not present

## 2013-11-05 LAB — POCT INR: INR: 2.3

## 2013-11-05 MED ORDER — CEFAZOLIN SODIUM-DEXTROSE 2-3 GM-% IV SOLR
2.0000 g | INTRAVENOUS | Status: DC
Start: 2013-11-05 — End: 2013-11-06
  Filled 2013-11-05: qty 50

## 2013-11-05 MED ORDER — SODIUM CHLORIDE 0.9 % IR SOLN
80.0000 mg | Status: DC
  Filled 2013-11-05: qty 2

## 2013-11-06 ENCOUNTER — Encounter (HOSPITAL_COMMUNITY)
Admission: RE | Disposition: A | Discharge status: Home / Self Care | Payer: Self-pay | Source: Ambulatory Visit | Attending: Internal Medicine

## 2013-11-06 ENCOUNTER — Ambulatory Visit (HOSPITAL_COMMUNITY)
Admission: RE | Admit: 2013-11-06 | Discharge: 2013-11-06 | Disposition: A | Discharge status: Home / Self Care | Payer: Medicare Other | Source: Ambulatory Visit | Attending: Internal Medicine | Admitting: Internal Medicine

## 2013-11-06 ENCOUNTER — Encounter (HOSPITAL_COMMUNITY): Admission: RE | Payer: Medicare Other | Source: Ambulatory Visit

## 2013-11-06 ENCOUNTER — Ambulatory Visit (HOSPITAL_COMMUNITY): Admission: RE | Admit: 2013-11-06 | Payer: Medicare Other | Source: Ambulatory Visit | Admitting: Internal Medicine

## 2013-11-06 DIAGNOSIS — E039 Hypothyroidism, unspecified: Secondary | ICD-10-CM | POA: Insufficient documentation

## 2013-11-06 DIAGNOSIS — K219 Gastro-esophageal reflux disease without esophagitis: Secondary | ICD-10-CM | POA: Insufficient documentation

## 2013-11-06 DIAGNOSIS — I5022 Chronic systolic (congestive) heart failure: Secondary | ICD-10-CM | POA: Insufficient documentation

## 2013-11-06 DIAGNOSIS — Z95810 Presence of automatic (implantable) cardiac defibrillator: Secondary | ICD-10-CM | POA: Diagnosis present

## 2013-11-06 DIAGNOSIS — I2510 Atherosclerotic heart disease of native coronary artery without angina pectoris: Secondary | ICD-10-CM | POA: Diagnosis not present

## 2013-11-06 DIAGNOSIS — I10 Essential (primary) hypertension: Secondary | ICD-10-CM | POA: Diagnosis present

## 2013-11-06 DIAGNOSIS — N400 Enlarged prostate without lower urinary tract symptoms: Secondary | ICD-10-CM | POA: Insufficient documentation

## 2013-11-06 DIAGNOSIS — I2589 Other forms of chronic ischemic heart disease: Secondary | ICD-10-CM | POA: Diagnosis not present

## 2013-11-06 DIAGNOSIS — E785 Hyperlipidemia, unspecified: Secondary | ICD-10-CM | POA: Insufficient documentation

## 2013-11-06 DIAGNOSIS — Z4502 Encounter for adjustment and management of automatic implantable cardiac defibrillator: Secondary | ICD-10-CM | POA: Diagnosis not present

## 2013-11-06 DIAGNOSIS — E119 Type 2 diabetes mellitus without complications: Secondary | ICD-10-CM | POA: Insufficient documentation

## 2013-11-06 DIAGNOSIS — G4733 Obstructive sleep apnea (adult) (pediatric): Secondary | ICD-10-CM | POA: Diagnosis not present

## 2013-11-06 DIAGNOSIS — I509 Heart failure, unspecified: Secondary | ICD-10-CM | POA: Insufficient documentation

## 2013-11-06 DIAGNOSIS — T82198A Other mechanical complication of other cardiac electronic device, initial encounter: Secondary | ICD-10-CM | POA: Diagnosis not present

## 2013-11-06 DIAGNOSIS — Z7901 Long term (current) use of anticoagulants: Secondary | ICD-10-CM

## 2013-11-06 DIAGNOSIS — Z794 Long term (current) use of insulin: Secondary | ICD-10-CM | POA: Insufficient documentation

## 2013-11-06 DIAGNOSIS — I4891 Unspecified atrial fibrillation: Secondary | ICD-10-CM | POA: Insufficient documentation

## 2013-11-06 DIAGNOSIS — J438 Other emphysema: Secondary | ICD-10-CM | POA: Insufficient documentation

## 2013-11-06 DIAGNOSIS — Z87891 Personal history of nicotine dependence: Secondary | ICD-10-CM | POA: Insufficient documentation

## 2013-11-06 DIAGNOSIS — I255 Ischemic cardiomyopathy: Secondary | ICD-10-CM

## 2013-11-06 DIAGNOSIS — Z7982 Long term (current) use of aspirin: Secondary | ICD-10-CM | POA: Insufficient documentation

## 2013-11-06 DIAGNOSIS — L408 Other psoriasis: Secondary | ICD-10-CM | POA: Insufficient documentation

## 2013-11-06 HISTORY — PX: BIV ICD GENERTAOR CHANGE OUT: SHX5745

## 2013-11-06 LAB — BASIC METABOLIC PANEL
BUN: 22 mg/dL (ref 6–23)
CO2: 31 mEq/L (ref 19–32)
Calcium: 8.8 mg/dL (ref 8.4–10.5)
Chloride: 105 mEq/L (ref 96–112)
Creatinine, Ser: 1.3 mg/dL (ref 0.50–1.35)
GFR calc Af Amer: 59 mL/min — ABNORMAL LOW (ref >90)
GFR calc non Af Amer: 51 mL/min — ABNORMAL LOW (ref >90)
Glucose, Bld: 139 mg/dL — ABNORMAL HIGH (ref 70–99)
Potassium: 4.6 mEq/L (ref 3.7–5.3)
Sodium: 145 mEq/L (ref 137–147)

## 2013-11-06 LAB — CBC
HCT: 42.5 % (ref 39.0–52.0)
Hemoglobin: 14.2 g/dL (ref 13.0–17.0)
MCH: 31.9 pg (ref 26.0–34.0)
MCHC: 33.4 g/dL (ref 30.0–36.0)
MCV: 95.5 fL (ref 78.0–100.0)
Platelets: 166 10*3/uL (ref 150–400)
RBC: 4.45 MIL/uL (ref 4.22–5.81)
RDW: 15.1 % (ref 11.5–15.5)
WBC: 6.9 10*3/uL (ref 4.0–10.5)

## 2013-11-06 LAB — SURGICAL PCR SCREEN
MRSA, PCR: NEGATIVE
Staphylococcus aureus: NEGATIVE

## 2013-11-06 LAB — PROTIME-INR
INR: 2.31 — ABNORMAL HIGH (ref 0.00–1.49)
Prothrombin Time: 24.6 seconds — ABNORMAL HIGH (ref 11.6–15.2)

## 2013-11-06 LAB — GLUCOSE, CAPILLARY
Glucose-Capillary: 134 mg/dL — ABNORMAL HIGH (ref 70–99)
Glucose-Capillary: 101 mg/dL — ABNORMAL HIGH (ref 70–99)

## 2013-11-06 SURGERY — BIV ICD GENERTAOR CHANGE OUT
Anesthesia: LOCAL

## 2013-11-06 MED ORDER — MUPIROCIN 2 % EX OINT
TOPICAL_OINTMENT | Freq: Two times a day (BID) | CUTANEOUS | Status: DC
  Administered 2013-11-06: 1 via NASAL

## 2013-11-06 MED ORDER — LIDOCAINE HCL (PF) 1 % IJ SOLN
INTRAMUSCULAR | Status: AC
  Filled 2013-11-06: qty 30

## 2013-11-06 MED ORDER — CHLORHEXIDINE GLUCONATE 4 % EX LIQD: 60.0000 mL | Freq: Once | CUTANEOUS | Status: DC

## 2013-11-06 MED ORDER — SODIUM CHLORIDE 0.9 % IV SOLN
INTRAVENOUS | Status: DC
  Administered 2013-11-06: 1000 mL via INTRAVENOUS

## 2013-11-06 MED ORDER — MUPIROCIN 2 % EX OINT
TOPICAL_OINTMENT | CUTANEOUS | Status: AC
  Administered 2013-11-06: 1 via NASAL
  Filled 2013-11-06: qty 22

## 2013-11-06 NOTE — Discharge Instructions (Signed)
Pacemaker Battery Change, Care After  °Refer to this sheet in the next few weeks. These instructions provide you with information on caring for yourself after your procedure. Your health care provider may also give you more specific instructions. Your treatment has been planned according to current medical practices, but problems sometimes occur. Call your health care provider if you have any problems or questions after your procedure. °WHAT TO EXPECT AFTER THE PROCEDURE °After your procedure, it is typical to have the following sensations: °· Soreness at the pacemaker site. °HOME CARE INSTRUCTIONS  °· Keep the incision clean and dry. °· Unless advised otherwise, you may shower beginning 48 hours after your procedure. °· For the first week after the replacement, avoid stretching motions that pull at the incision site and avoid heavy exercise with the arm on the same side as the incision. °· Only take over-the-counter or prescription medicines for pain, discomfort, or fever as directed by your health care provider. °· Your health care provider will tell you when you will need to next test your pacemaker by telephone or when to return to the office for follow up for removal of stitches. °SEEK MEDICAL CARE IF:  °· You have pain at the incision site that is not relieved by over-the-counter or prescription medicine. °· There is drainage or pus from the incision site. °· There is swelling larger than a lime at the incision site. °· You develop red streaking that extends above or below the incision site. °· You feel brief, intermittent palpitations, lightheadedness, or any symptoms that you feel might be related to your heart. °SEEK IMMEDIATE MEDICAL CARE IF:  °· You experience chest pain that is different than the pain at the pacemaker site. °· Shortness of breath. °· Palpitations or irregular heart beat. °· Lightheadedness that does not go away quickly. °· Fainting. °· You have pain that gets worse and is not relieved by  medicine. °MAKE SURE YOU:  °· Understand these instructions. °· Will watch your condition. °· Will get help right away if you are not doing well or get worse. °Document Released: 08/01/2013 Document Reviewed: 04/25/2013 °[employer]® Patient Information ©2014 [employer]. ° °

## 2013-11-06 NOTE — H&P (Addendum)
[name] Shelling, MD:  Primary Cardiologist: Dr Lenda Kelp is a 80 y.o. male with a h/o CAD, chronic systolic dysfunction and persistent afib sp BiV ICD (MDT) by Dr Leonia Reeves who presents today for ICD implant.  The patient reports doing very well since having a BiV ICD implanted and remains reasonably active despite his age. He has reached elective replacement indicator. He has chronic stable SOB. He reports that activity is limited by backpain chronically.  He had a stroke 12/14 with a subtherapeutic INR but feels that he is making good recovery.  Today, he denies symptoms of palpitations, chest pain, lower extremity edema, dizziness, presyncope, syncope, or neurologic sequela. The patientis tolerating medications without difficulties and is otherwise without complaint today.   Past Medical History   Diagnosis  Date   .  Dyspnea    .  OSA (obstructive sleep apnea)    .  CAD (coronary artery disease)    .  HTN (hypertension)    .  DM (diabetes mellitus), type 2    .  Hyperlipidemia    .  Systolic congestive heart failure  2009     s/p BiV ICD implantation by Dr Leonia Reeves (MDT)   .  Atrial fibrillation      persistent, previously seen at [facility] and placed on amiodarone   .  Hypothyroidism    .  PNA (pneumonia)    .  Cleft palate    .  Nasal septal deviation    .  Iron deficiency anemia    .  GERD (gastroesophageal reflux disease)    .  Benign prostatic hypertrophy    .  Nephrolithiasis    .  Psoriasis    .  Seborrheic keratosis    .  COPD with emphysema  04/01/2010    Past Surgical History   Procedure  Laterality  Date   .  Carpal tunnel release     .  Coronary artery bypass graft     .  Left cleft palate and left cleft lip repair     .  Cataract extraction     .  C-spine surgery     .  Icd/bi-v insertion   10-08-08     Dr Leonia Reeves (MDT) implant for primary prevention    History    Social History   .  Marital Status:  Married     Spouse Name:  N/A     Number of  Children:  N/A   .  Years of Education:  N/A    Occupational History   .  retired      Engineer, agricultural    Social History Main Topics   .  Smoking status:  Former Smoker -- 3.00 packs/day for 65 years     Types:  Cigarettes     Quit date:  10/25/1998   .  Smokeless tobacco:  Never Used   .  Alcohol Use:  No   .  Drug Use:  No   .  Sexual Activity:  Not on file    Other Topics  Concern   .  Not on file    Social History Narrative    Lives [place]    Retired    Family History   Problem  Relation  Age of Onset   .  Asthma  Father    .  Heart disease  Brother    .  Stroke  Father    No Known Allergies  Current Outpatient  Prescriptions   Medication  Sig  Dispense  Refill   .  albuterol (PROVENTIL HFA;VENTOLIN HFA) 108 (90 BASE) MCG/ACT inhaler  Inhale 2 puffs into the lungs every 6 (six) hours as needed.     [name]  albuterol (PROVENTIL) (2.5 MG/3ML) 0.083% nebulizer solution  Take 3 mLs (2.5 mg total) by nebulization every 6 (six) hours as needed for wheezing.  300 mL  3   .  amiodarone (PACERONE) 200 MG tablet  Take 200 mg by mouth daily.     [name]  aspirin 81 MG tablet  Take 81 mg by mouth daily.     [name]  atorvastatin (LIPITOR) 80 MG tablet  Take 80 mg by mouth daily.     .  carvedilol (COREG) 12.5 MG tablet  Take 12.5 mg by mouth 2 (two) times daily with a meal.     .  furosemide (LASIX) 40 MG tablet  TAKE 1 TABLET BY MOUTH TWICE A DAY  60 tablet  6   .  HYDROcodone-acetaminophen (NORCO/VICODIN) 5-325 MG per tablet  Take 1 tablet by mouth as needed.     .  insulin aspart (NOVOLOG) 100 UNIT/ML injection  23 units before breakfast and dinner, 15 units before lunch     .  insulin glargine (LANTUS SOLOSTAR) 100 UNIT/ML injection  Inject 90 Units into the skin at bedtime.     [name]  levothyroxine (SYNTHROID, LEVOTHROID) 75 MCG tablet  Take 75 mcg by mouth daily.     .  montelukast (SINGULAIR) 10 MG tablet  TAKE 1 TABLET BY MOUTH AT BEDTIME  30 tablet  5   .  Multiple Vitamin (MULTIVITAMIN)  tablet  Take 1 tablet by mouth daily.     [name]  omeprazole (PRILOSEC) 20 MG capsule  Take 20 mg by mouth daily.     [name]  spironolactone (ALDACTONE) 25 MG tablet  1/2 tablet once a day     .  SYMBICORT 160-4.5 MCG/ACT inhaler  INHALE 2 PUFFS IN THE LUNGS TWICE A DAY  10.2 g  3   .  valsartan (DIOVAN) 80 MG tablet  Take 80 mg by mouth daily.     [name]  warfarin (COUMADIN) 5 MG tablet  TAKE 1 TABLET BY MOUTH ONCE A DAY  30 tablet  2   .  ZETIA 10 MG tablet  TAKE 1 TABLET ONCE A DAY ORALLY  30 tablet  11    No current facility-administered medications for this visit.   ROS- all systems are reviewed and negative except as per HPI   Physical Exam:  Filed Vitals:    10/03/13 1045   BP:  104/62   Pulse:  76   Height:  5\' 8"  (1.727 m)   Weight:  250 lb 12.8 oz (113.762 kg)   GEN- The patient is elderly appearing, alert and oriented x 3 today.  Head- normocephalic, atraumatic  Eyes- Sclera clear, conjunctiva pink  Ears- hearing intact  Oropharynx- clear  Neck- supple, no JVP  Lungs- Clear to ausculation bilaterally, normal work of breathing  Chest- BiV ICD pocket is well healed  Heart- Regular rate and rhythm, no murmurs, rubs or gallops, PMI not laterally displaced  GI- soft, NT, ND, + BS  Extremities- no clubbing, cyanosis, 1+ edema   Assessment and Plan:  1. Chronic systolic dysfunction  The patient has received clinical benefit from CRT  Repeat echo reveals EF 40-45% He has reached ERI battery status.  Risks, benefits, and alternatives to BiV ICD  pulse generator replacement were discussed with the patient at length today. He wishes to proceed at this time.    ICD Criteria  Current LVEF (within 6 months):40%  NYHA Functional Classification: Class II  Heart Failure History:  Yes, Duration of heart failure since onset is > 9 months  Non-Ischemic Dilated Cardiomyopathy History:  No.  Atrial Fibrillation/Atrial Flutter:  Yes, A-Fib/A-Flutter type: Persistent (>7 days).  Ventricular  Tachycardia History:  No.  Cardiac Arrest History:  No  History of Syndromes with Risk of Sudden Death:  No.  Previous ICD:  Yes, ICD Type:  CRT-D, Reason for ICD:  Primary prevention.  20%  Electrophysiology Study: No.  Prior MI: No.  PPM: No.  OSA:  Yes  Patient Life Expectancy of >=1 year: Yes.  Anticoagulation Therapy:  Not held  Beta Blocker Therapy:  Yes.   Ace Inhibitor/ARB Therapy:  Yes.

## 2013-11-06 NOTE — Op Note (Addendum)
SURGEON:  Thompson Grayer, MD      PREPROCEDURE DIAGNOSES:   1. Ischemic cardiomyopathy.   2. New York Heart Association class II, heart failure chronically.   3. CAD      POSTPROCEDURE DIAGNOSES:   1. Ischemic cardiomyopathy.   2. New York Heart Association class II, heart failure chronically.   3. CAD   PROCEDURES:    1. ICD lead revision.  2. ICD pulse generator replacement   3. Skin pocket revision     INTRODUCTION:  [name] is a 80 y.o. male with an ischemic CM (EF 40%--> previously 20% prior to CRT), NYHA Class II CHF, CAD, and prior ICD implantation who presents today for ICD pulse generator replacement for ERI battery status.       DESCRIPTION OF PROCEDURE:  Informed written consent was obtained and the patient was brought to the electrophysiology lab in the fasting state.  The patient required no sedation for the procedure today.  The patient's left chest was prepped and draped in the usual sterile fashion by the EP lab staff.  The skin overlying the left deltopectoral region was infiltrated with lidocaine for local analgesia.  A 5-cm incision was made over the existing ICD pocket.  Electrocautery was used to assure hemostasis.  The device was exposed and removed from the pocket. A single silk stitch was identified and removed which previously secured the device within the pocket. The device was disconnected from the leads.  The leads were examined thoroughly and their integrity confirmed to be intact.  Interestingly the LV lead had been coiled on itself and a silk tie had been used to bunch the lead excess within the pocket.  I did not cut this suture for fear of damaging the lead.  I worry about LV lead integrity long term however.  I therefore opted to repair the LV lead in this region by placing a layer of medical adhesive over this portion of the lead where increased pressure contact from the silk tie occurred.  The right atrial lead was confirmed to be a Medtronic model  [id number]  (serial # [id number]) lead implanted 10/08/2008.  The right ventricular lead was confirmed to be a Medtronic model [id number] (serial number [id number]) right ventricular defibrillator lead also implanted 10/08/2008.The left ventricular lead was confirmed to be a Medtronic model [id number] (serial number [id number]) left ventricular lead also implanted 10/08/2008.   Atrial lead P-waves measured 3.4 mV with an impedance of 626 ohms and a threshold of 0.5 volts at 0.5 milliseconds.  The right ventricular lead R-wave measured 11.8 mV with impedance of 763 ohms and a threshold of 1.0 volts at 0.5 milliseconds.  Left ventricular lead impedance was 1056 Ohms with a threshold of 0.7V @0 .5 sec.  All three leads were then connected to a Medtronic Viva XT CRT-D model DTBA1D1 (SN [id number]) BiV ICD. The pocket was revised to accomodate this new device.   The pocket was  irrigated with copious gentamicin solution.  The defibrillator was placed into the  Pocket.  The pocket was then closed in 2 layers with 2.0 Vicryl suture  for the subcutaneous and subcuticular layers.  Steri-Strips and a  sterile dressing were then applied.   As the patient had a stroke within the past 30 days, I elected to not perform DFT testing today     CONCLUSIONS:   1. Ischemic cardiomyopathy with chronic New York Heart Association class III heart failure, CAD, and good response to CRT previously  2.  BiV ICD at elective replacement indicator  3. Successful BiV ICD pulse generator replacement   4.  No early apparent complications.   Jeneen Rinks Emberlin Verner,MD

## 2013-11-09 ENCOUNTER — Encounter (HOSPITAL_COMMUNITY): Payer: Self-pay | Admitting: *Deleted

## 2013-11-12 ENCOUNTER — Other Ambulatory Visit: Payer: Medicare Other

## 2013-11-15 ENCOUNTER — Ambulatory Visit (INDEPENDENT_AMBULATORY_CARE_PROVIDER_SITE_OTHER): Payer: Medicare Other | Admitting: *Deleted

## 2013-11-15 DIAGNOSIS — Z5181 Encounter for therapeutic drug level monitoring: Secondary | ICD-10-CM | POA: Insufficient documentation

## 2013-11-15 DIAGNOSIS — I4891 Unspecified atrial fibrillation: Secondary | ICD-10-CM | POA: Diagnosis not present

## 2013-11-15 DIAGNOSIS — I2589 Other forms of chronic ischemic heart disease: Secondary | ICD-10-CM | POA: Diagnosis not present

## 2013-11-15 DIAGNOSIS — I5022 Chronic systolic (congestive) heart failure: Secondary | ICD-10-CM | POA: Diagnosis not present

## 2013-11-15 DIAGNOSIS — Z7901 Long term (current) use of anticoagulants: Secondary | ICD-10-CM | POA: Diagnosis not present

## 2013-11-15 DIAGNOSIS — I6789 Other cerebrovascular disease: Secondary | ICD-10-CM

## 2013-11-15 LAB — MDC_IDC_ENUM_SESS_TYPE_INCLINIC
Battery Remaining Longevity: 100 mo
Battery Voltage: 3.14 V
Brady Statistic AP VP Percent: 97.56 %
Brady Statistic AP VS Percent: 1.3 %
Brady Statistic AS VP Percent: 1.13 %
Brady Statistic AS VS Percent: 0.01 %
Brady Statistic RA Percent Paced: 98.86 %
Brady Statistic RV Percent Paced: 97.72 %
Date Time Interrogation Session: 20150122092233
HighPow Impedance: 171 Ohm
HighPow Impedance: 48 Ohm
HighPow Impedance: 70 Ohm
Lead Channel Impedance Value: 532 Ohm
Lead Channel Impedance Value: 551 Ohm
Lead Channel Impedance Value: 589 Ohm
Lead Channel Impedance Value: 817 Ohm
Lead Channel Impedance Value: 988 Ohm
Lead Channel Pacing Threshold Amplitude: 0.75 V
Lead Channel Pacing Threshold Amplitude: 0.75 V
Lead Channel Pacing Threshold Amplitude: 0.875 V
Lead Channel Pacing Threshold Pulse Width: 0.4 ms
Lead Channel Pacing Threshold Pulse Width: 0.4 ms
Lead Channel Pacing Threshold Pulse Width: 0.6 ms
Lead Channel Sensing Intrinsic Amplitude: 1.375 mV
Lead Channel Sensing Intrinsic Amplitude: 10 mV
Lead Channel Setting Pacing Amplitude: 1.5 V
Lead Channel Setting Pacing Amplitude: 2 V
Lead Channel Setting Pacing Amplitude: 2 V
Lead Channel Setting Pacing Pulse Width: 0.4 ms
Lead Channel Setting Pacing Pulse Width: 0.6 ms
Lead Channel Setting Sensing Sensitivity: 0.3 mV
Zone Setting Detection Interval: 300 ms
Zone Setting Detection Interval: 330 ms
Zone Setting Detection Interval: 330 ms
Zone Setting Detection Interval: 400 ms

## 2013-11-15 LAB — POCT INR: INR: 2.3

## 2013-11-15 NOTE — Progress Notes (Signed)

## 2013-11-21 ENCOUNTER — Encounter: Payer: Self-pay | Admitting: Internal Medicine

## 2013-11-21 ENCOUNTER — Ambulatory Visit: Payer: Medicare Other | Admitting: Cardiology

## 2013-11-26 DIAGNOSIS — M79609 Pain in unspecified limb: Secondary | ICD-10-CM | POA: Diagnosis not present

## 2013-11-26 DIAGNOSIS — B351 Tinea unguium: Secondary | ICD-10-CM | POA: Diagnosis not present

## 2013-12-05 ENCOUNTER — Ambulatory Visit (INDEPENDENT_AMBULATORY_CARE_PROVIDER_SITE_OTHER): Payer: Medicare Other | Admitting: Cardiology

## 2013-12-05 ENCOUNTER — Encounter: Payer: Self-pay | Admitting: Cardiology

## 2013-12-05 ENCOUNTER — Ambulatory Visit (INDEPENDENT_AMBULATORY_CARE_PROVIDER_SITE_OTHER): Payer: Medicare Other | Admitting: Pharmacist

## 2013-12-05 VITALS — BP 92/71 | HR 81 | Ht 68.0 in | Wt 252.4 lb

## 2013-12-05 DIAGNOSIS — I4891 Unspecified atrial fibrillation: Secondary | ICD-10-CM

## 2013-12-05 DIAGNOSIS — I420 Dilated cardiomyopathy: Secondary | ICD-10-CM

## 2013-12-05 DIAGNOSIS — I5022 Chronic systolic (congestive) heart failure: Secondary | ICD-10-CM | POA: Diagnosis not present

## 2013-12-05 DIAGNOSIS — Z7901 Long term (current) use of anticoagulants: Secondary | ICD-10-CM | POA: Diagnosis not present

## 2013-12-05 DIAGNOSIS — I2589 Other forms of chronic ischemic heart disease: Secondary | ICD-10-CM

## 2013-12-05 DIAGNOSIS — I10 Essential (primary) hypertension: Secondary | ICD-10-CM | POA: Diagnosis not present

## 2013-12-05 DIAGNOSIS — I6789 Other cerebrovascular disease: Secondary | ICD-10-CM

## 2013-12-05 DIAGNOSIS — I255 Ischemic cardiomyopathy: Secondary | ICD-10-CM | POA: Insufficient documentation

## 2013-12-05 DIAGNOSIS — G4733 Obstructive sleep apnea (adult) (pediatric): Secondary | ICD-10-CM | POA: Insufficient documentation

## 2013-12-05 DIAGNOSIS — I2510 Atherosclerotic heart disease of native coronary artery without angina pectoris: Secondary | ICD-10-CM

## 2013-12-05 DIAGNOSIS — Z5181 Encounter for therapeutic drug level monitoring: Secondary | ICD-10-CM

## 2013-12-05 DIAGNOSIS — Z95810 Presence of automatic (implantable) cardiac defibrillator: Secondary | ICD-10-CM

## 2013-12-05 DIAGNOSIS — I480 Paroxysmal atrial fibrillation: Secondary | ICD-10-CM | POA: Insufficient documentation

## 2013-12-05 LAB — HEPATIC FUNCTION PANEL
ALT: 19 U/L (ref 0–53)
AST: 22 U/L (ref 0–37)
Albumin: 3.4 g/dL — ABNORMAL LOW (ref 3.5–5.2)
Alkaline Phosphatase: 66 U/L (ref 39–117)
Bilirubin, Direct: 0.1 mg/dL (ref 0.0–0.3)
Total Bilirubin: 0.7 mg/dL (ref 0.3–1.2)
Total Protein: 6.5 g/dL (ref 6.0–8.3)

## 2013-12-05 LAB — BASIC METABOLIC PANEL
BUN: 23 mg/dL (ref 6–23)
CO2: 34 mEq/L — ABNORMAL HIGH (ref 19–32)
Calcium: 8.7 mg/dL (ref 8.4–10.5)
Chloride: 100 mEq/L (ref 96–112)
Creatinine, Ser: 1.3 mg/dL (ref 0.4–1.5)
GFR: 58.29 mL/min — ABNORMAL LOW (ref >60.00)
Glucose, Bld: 338 mg/dL — ABNORMAL HIGH (ref 70–99)
Potassium: 4.8 mEq/L (ref 3.5–5.1)
Sodium: 139 mEq/L (ref 135–145)

## 2013-12-05 LAB — TSH: TSH: 3.24 u[IU]/mL (ref 0.35–5.50)

## 2013-12-05 LAB — POCT INR: INR: 3.7

## 2013-12-05 MED ORDER — CARVEDILOL 6.25 MG PO TABS: ORAL_TABLET | ORAL | Status: DC

## 2013-12-05 NOTE — Progress Notes (Signed)
[address], [place], [place]  [address] Phone: [phone] Fax:  [phone]  Date:  12/05/2013   ID:  [name], DOB 1934/02/02, MRN [id number]  PCP:  Irven Shelling, MD  Cardiologist:  Fransico Him, MD     History of Present Illness: ROSENDO Ramirez is a 80 y.o. male with a history of ASCAD s/p CABG with patent grafts at cath 2009, ischemic DCM EF 35-40% by MUGA 2011, BiV AICD, PAF, chronic systemic anticoagulation, HTN, dyslipidemia, OSA on CPAP who presents today for followup.  He is doing well.  He denies any chest pain or pressure.  He has chronic SOB from his COPD which is stable. He denies any LE edema, dizziness, palpitations or syncope.  He tolerates his CPAP well.  He uses a full face mask and feels the pressure is adequate.  He feels rested when he wakes up and has no daytime sleepiness.   Wt Readings from Last 3 Encounters:  12/05/13 252 lb 6.4 oz (114.488 kg)  11/06/13 246 lb (111.585 kg)  11/06/13 246 lb (111.585 kg)     Past Medical History  Diagnosis Date  . Dyspnea   . OSA (obstructive sleep apnea)   . HTN (hypertension)   . DM (diabetes mellitus), type 2   . Hyperlipidemia   . Systolic congestive heart failure 2009    s/p BiV ICD implantation by Dr Leonia Reeves (MDT)  . Atrial fibrillation     persistent, previously seen at [facility] and placed on amiodarone  . Hypothyroidism   . PNA (pneumonia)   . Cleft palate   . Nasal septal deviation   . Iron deficiency anemia   . GERD (gastroesophageal reflux disease)   . Benign prostatic hypertrophy   . Nephrolithiasis   . Psoriasis   . Seborrheic keratosis   . COPD with emphysema 04/01/2010  . CAD (coronary artery disease)     multivessel s/p inferolateral wall MI with subsequent CABG 11/1998.  Cath 2009 with Patent grafts  . Ischemic dilated cardiomyopathy     EF 35-40% by MUGA 6/11    Current Outpatient Prescriptions  Medication Sig Dispense Refill  . albuterol (PROVENTIL HFA;VENTOLIN  HFA) 108 (90 BASE) MCG/ACT inhaler Inhale 2 puffs into the lungs every 6 (six) hours as needed for wheezing or shortness of breath.      [name] albuterol (PROVENTIL) (2.5 MG/3ML) 0.083% nebulizer solution Take 3 mLs (2.5 mg total) by nebulization every 6 (six) hours as needed for wheezing.  300 mL  3  . amiodarone (PACERONE) 200 MG tablet Take 200 mg by mouth daily.        [name] aspirin 81 MG tablet Take 81 mg by mouth daily.        [name] atorvastatin (LIPITOR) 80 MG tablet Take 80 mg by mouth daily.        . budesonide-formoterol (SYMBICORT) 160-4.5 MCG/ACT inhaler Inhale 2 puffs into the lungs 2 (two) times daily.      . carvedilol (COREG) 12.5 MG tablet Take 12.5 mg by mouth 2 (two) times daily with a meal.        . ezetimibe (ZETIA) 10 MG tablet Take 10 mg by mouth daily.      . furosemide (LASIX) 40 MG tablet Take 40 mg by mouth 2 (two) times daily.      [name] HYDROcodone-acetaminophen (NORCO/VICODIN) 5-325 MG per tablet Take 1 tablet by mouth as needed for moderate pain.       [name] insulin aspart (  NOVOLOG) 100 UNIT/ML injection 25 units before breakfast and lunch, 30 units before supper      . insulin glargine (LANTUS SOLOSTAR) 100 UNIT/ML injection Inject 85 Units into the skin at bedtime.       [name] levothyroxine (SYNTHROID, LEVOTHROID) 75 MCG tablet Take 75 mcg by mouth daily.        . montelukast (SINGULAIR) 10 MG tablet Take 10 mg by mouth at bedtime.      . Multiple Vitamin (MULTIVITAMIN) tablet Take 1 tablet by mouth daily.        [name] omeprazole (PRILOSEC) 20 MG capsule Take 20 mg by mouth daily.        [name] spironolactone (ALDACTONE) 25 MG tablet Take 12.5 mg by mouth daily. 1/2 tablet once a day      . valsartan (DIOVAN) 80 MG tablet Take 80 mg by mouth daily.        [name] warfarin (COUMADIN) 5 MG tablet Take 5 mg by mouth daily. 7.5 mg on Thursday, 5 mg all other days.       No current facility-administered medications for this visit.    Allergies:   No Known Allergies  Social History:  The patient  reports  that he quit smoking about 15 years ago. His smoking use included Cigarettes. He has a 195 pack-year smoking history. He has never used smokeless tobacco. He reports that he does not drink alcohol or use illicit drugs.   Family History:  The patient's family history includes Asthma in his father; Heart disease in his brother; Stroke in his father.   ROS:  Please see the history of present illness.      All other systems reviewed and negative.   PHYSICAL EXAM: VS:  BP 92/71  Pulse 81  Ht 5\' 8"  (1.727 m)  Wt 252 lb 6.4 oz (114.488 kg)  BMI 38.39 kg/m2 Well nourished, well developed, in no acute distress HEENT: normal Neck: no JVD Cardiac:  normal S1, S2; RRR; no murmur Lungs:  clear to auscultation bilaterally, no wheezing, rhonchi or rales Abd: soft, nontender, no hepatomegaly Ext: no edema Skin: warm and dry Neuro:  CNs 2-12 intact, no focal abnormalities noted       ASSESSMENT AND PLAN:  1. ASCAD s/p CABG with no angina  - continue ASA 2. HTN - slightly low today but at home it usually runs 120/80's but occasionally runs in the 97'D systolic  -  Decrease Carvedilol to 6.25mg  BID 3. Dyslipidemia - at goal with LDL 57 08/2013 with PCP  - continue Zetia/atorvastatin 4. OSA on CPAP - I will get a download from his DME 5. Ischemic DCM EF 35-40%  - continue beta blocker/ARB 6.  Chronic systolic CHF Class II - compensated  - continue aldactone/Lasix   - check BMET 7.  Chronic atrial fibrillation rate controlled  - continue Amiodarone for rate control/Warfarin  - check TSH.LFTS/PFT with DLCO 8.  Chronic systemic anticoagulation  Followup with me in 6 months  Signed, Fransico Him, MD 12/05/2013 2:31 PM

## 2013-12-05 NOTE — Patient Instructions (Signed)
Your physician has recommended you make the following change in your medication:  1. Decrease Carvedilol to 6.25 mg 1 tablet by mouth twice a day.  Your physician has recommended that you have a pulmonary function test. Pulmonary Function Tests are a group of tests that measure how well air moves in and out of your lungs.  Your physician recommends that you return for lab work today for BMET, TSH, and HEPATIC.  Your physician wants you to follow-up in: 6 months with Dr. Radford Pax. You will receive a reminder letter in the mail two months in advance. If you don't receive a letter, please call our office to schedule the follow-up appointment.

## 2013-12-06 DIAGNOSIS — IMO0001 Reserved for inherently not codable concepts without codable children: Secondary | ICD-10-CM | POA: Diagnosis not present

## 2013-12-12 ENCOUNTER — Encounter: Payer: Self-pay | Admitting: Pulmonary Disease

## 2013-12-17 ENCOUNTER — Other Ambulatory Visit: Payer: Self-pay | Admitting: Pulmonary Disease

## 2013-12-19 ENCOUNTER — Ambulatory Visit (HOSPITAL_COMMUNITY)
Admission: RE | Admit: 2013-12-19 | Discharge: 2013-12-19 | Disposition: A | Discharge status: Home / Self Care | Payer: Medicare Other | Source: Ambulatory Visit | Attending: Cardiology | Admitting: Cardiology

## 2013-12-19 DIAGNOSIS — I10 Essential (primary) hypertension: Secondary | ICD-10-CM | POA: Insufficient documentation

## 2013-12-19 DIAGNOSIS — I5022 Chronic systolic (congestive) heart failure: Secondary | ICD-10-CM | POA: Diagnosis not present

## 2013-12-19 DIAGNOSIS — I480 Paroxysmal atrial fibrillation: Secondary | ICD-10-CM

## 2013-12-19 DIAGNOSIS — I2589 Other forms of chronic ischemic heart disease: Secondary | ICD-10-CM | POA: Insufficient documentation

## 2013-12-19 DIAGNOSIS — I4891 Unspecified atrial fibrillation: Secondary | ICD-10-CM | POA: Insufficient documentation

## 2013-12-19 DIAGNOSIS — I2510 Atherosclerotic heart disease of native coronary artery without angina pectoris: Secondary | ICD-10-CM | POA: Insufficient documentation

## 2013-12-19 DIAGNOSIS — G4733 Obstructive sleep apnea (adult) (pediatric): Secondary | ICD-10-CM | POA: Insufficient documentation

## 2013-12-19 DIAGNOSIS — Z95810 Presence of automatic (implantable) cardiac defibrillator: Secondary | ICD-10-CM | POA: Diagnosis not present

## 2013-12-19 LAB — PULMONARY FUNCTION TEST
DL/VA % pred: 66 %
DL/VA: 2.93 ml/min/mmHg/L
DLCO cor % pred: 49 %
DLCO cor: 14.25 ml/min/mmHg
DLCO unc % pred: 49 %
DLCO unc: 14.25 ml/min/mmHg
FEF 25-75 Post: 1 L/sec
FEF 25-75 Pre: 1.28 L/sec
FEF2575-%Change-Post: -21 %
FEF2575-%Pred-Post: 54 %
FEF2575-%Pred-Pre: 69 %
FEV1-%Change-Post: -5 %
FEV1-%Pred-Post: 73 %
FEV1-%Pred-Pre: 77 %
FEV1-Post: 1.93 L
FEV1-Pre: 2.03 L
FEV1FVC-%Change-Post: -6 %
FEV1FVC-%Pred-Pre: 99 %
FEV6-%Change-Post: 1 %
FEV6-%Pred-Post: 82 %
FEV6-%Pred-Pre: 81 %
FEV6-Post: 2.83 L
FEV6-Pre: 2.79 L
FEV6FVC-%Change-Post: 0 %
FEV6FVC-%Pred-Post: 106 %
FEV6FVC-%Pred-Pre: 106 %
FVC-%Change-Post: 1 %
FVC-%Pred-Post: 77 %
FVC-%Pred-Pre: 76 %
FVC-Post: 2.86 L
FVC-Pre: 2.82 L
Post FEV1/FVC ratio: 67 %
Post FEV6/FVC ratio: 99 %
Pre FEV1/FVC ratio: 72 %
Pre FEV6/FVC Ratio: 99 %
RV % pred: 139 %
RV: 3.47 L
TLC % pred: 98 %
TLC: 6.45 L

## 2013-12-19 MED ORDER — ALBUTEROL SULFATE (2.5 MG/3ML) 0.083% IN NEBU
2.5000 mg | INHALATION_SOLUTION | Freq: Once | RESPIRATORY_TRACT | Status: AC
  Administered 2013-12-19: 2.5 mg via RESPIRATORY_TRACT

## 2013-12-21 ENCOUNTER — Ambulatory Visit: Payer: Medicare Other | Admitting: Pulmonary Disease

## 2013-12-21 ENCOUNTER — Ambulatory Visit (INDEPENDENT_AMBULATORY_CARE_PROVIDER_SITE_OTHER): Payer: Medicare Other | Admitting: *Deleted

## 2013-12-21 ENCOUNTER — Other Ambulatory Visit: Payer: Self-pay

## 2013-12-21 ENCOUNTER — Other Ambulatory Visit: Payer: Self-pay | Admitting: General Surgery

## 2013-12-21 DIAGNOSIS — I6789 Other cerebrovascular disease: Secondary | ICD-10-CM

## 2013-12-21 DIAGNOSIS — Z7901 Long term (current) use of anticoagulants: Secondary | ICD-10-CM | POA: Diagnosis not present

## 2013-12-21 DIAGNOSIS — Z5181 Encounter for therapeutic drug level monitoring: Secondary | ICD-10-CM

## 2013-12-21 DIAGNOSIS — R942 Abnormal results of pulmonary function studies: Secondary | ICD-10-CM

## 2013-12-21 DIAGNOSIS — I4891 Unspecified atrial fibrillation: Secondary | ICD-10-CM | POA: Diagnosis not present

## 2013-12-21 LAB — POCT INR: INR: 2.6

## 2013-12-21 MED ORDER — ATORVASTATIN CALCIUM 80 MG PO TABS: 80.0000 mg | ORAL_TABLET | Freq: Every day | ORAL | Status: DC

## 2013-12-27 ENCOUNTER — Ambulatory Visit: Payer: Medicare Other | Admitting: Adult Health

## 2013-12-28 ENCOUNTER — Telehealth: Payer: Self-pay | Admitting: Pulmonary Disease

## 2013-12-28 NOTE — Telephone Encounter (Signed)
Spoke with the pharmacist at [facility]. They are going to fax [place] the PA.  I will leave this message in my box to follow up on.

## 2013-12-31 NOTE — Telephone Encounter (Signed)
PA has been initated through [employer]. Will leave message open to follow up on.

## 2013-12-31 NOTE — Telephone Encounter (Signed)
We still have not received the fax from [facility] in regards to the pt's Symbicort. I have called [facility] and they have given me the # to call to initiate PA ([phone]). When calling I received a message stating that [facility] is experiencing high call volumes and to call back in 1 hour. I am going to find the form on [facility]'s website and initiate PA that way.

## 2014-01-08 MED ORDER — MOMETASONE FURO-FORMOTEROL FUM 100-5 MCG/ACT IN AERO
2.0000 | INHALATION_SPRAY | Freq: Two times a day (BID) | RESPIRATORY_TRACT | Status: DC
Start: 2014-01-08 — End: 2015-01-28

## 2014-01-08 NOTE — Telephone Encounter (Signed)
Pt is aware of inhaler change. Rx has been sent in. Nothing further is needed.

## 2014-01-08 NOTE — Telephone Encounter (Signed)
Called [facility] to check on the status of pt's PA for Symbicort 160-4.78mcg Had to call the number for Medicare @ [phone] and spoke with representative Decyshia H. Per Decyshia Symbicort is not covered at all under pt's plan so unable to do Prior Authorization.  [facility] can do a letter of medical necessity. Covered alternatives however, are Advair and Dulera.  Dr Halford Chessman please advise, thank you.

## 2014-01-08 NOTE — Telephone Encounter (Signed)
Please inform pt of this difficulty with insurance coverage for symbicort.  Please send script for dulera 100/5 two puffs bid, and give enough refills for one year.

## 2014-01-09 DIAGNOSIS — I10 Essential (primary) hypertension: Secondary | ICD-10-CM | POA: Diagnosis not present

## 2014-01-09 DIAGNOSIS — N183 Chronic kidney disease, stage 3 unspecified: Secondary | ICD-10-CM | POA: Diagnosis not present

## 2014-01-09 DIAGNOSIS — Z23 Encounter for immunization: Secondary | ICD-10-CM | POA: Diagnosis not present

## 2014-01-09 DIAGNOSIS — E1129 Type 2 diabetes mellitus with other diabetic kidney complication: Secondary | ICD-10-CM | POA: Diagnosis not present

## 2014-01-09 DIAGNOSIS — J449 Chronic obstructive pulmonary disease, unspecified: Secondary | ICD-10-CM | POA: Diagnosis not present

## 2014-01-14 ENCOUNTER — Ambulatory Visit (INDEPENDENT_AMBULATORY_CARE_PROVIDER_SITE_OTHER): Payer: Medicare Other | Admitting: Pharmacist

## 2014-01-14 ENCOUNTER — Other Ambulatory Visit: Payer: Self-pay | Admitting: Pulmonary Disease

## 2014-01-14 ENCOUNTER — Ambulatory Visit (INDEPENDENT_AMBULATORY_CARE_PROVIDER_SITE_OTHER): Payer: Medicare Other | Admitting: Pulmonary Disease

## 2014-01-14 ENCOUNTER — Encounter: Payer: Self-pay | Admitting: Pulmonary Disease

## 2014-01-14 VITALS — BP 112/62 | HR 71 | Temp 97.1°F | Ht 67.0 in | Wt 251.8 lb

## 2014-01-14 DIAGNOSIS — J438 Other emphysema: Secondary | ICD-10-CM | POA: Diagnosis not present

## 2014-01-14 DIAGNOSIS — J439 Emphysema, unspecified: Secondary | ICD-10-CM

## 2014-01-14 DIAGNOSIS — Z7901 Long term (current) use of anticoagulants: Secondary | ICD-10-CM

## 2014-01-14 DIAGNOSIS — I6789 Other cerebrovascular disease: Secondary | ICD-10-CM | POA: Diagnosis not present

## 2014-01-14 DIAGNOSIS — I2510 Atherosclerotic heart disease of native coronary artery without angina pectoris: Secondary | ICD-10-CM | POA: Diagnosis not present

## 2014-01-14 DIAGNOSIS — R942 Abnormal results of pulmonary function studies: Secondary | ICD-10-CM | POA: Diagnosis not present

## 2014-01-14 DIAGNOSIS — G4733 Obstructive sleep apnea (adult) (pediatric): Secondary | ICD-10-CM | POA: Diagnosis not present

## 2014-01-14 DIAGNOSIS — Z5181 Encounter for therapeutic drug level monitoring: Secondary | ICD-10-CM

## 2014-01-14 DIAGNOSIS — I4891 Unspecified atrial fibrillation: Secondary | ICD-10-CM

## 2014-01-14 LAB — POCT INR: INR: 2.4

## 2014-01-14 NOTE — Assessment & Plan Note (Signed)
He has significant decrease in diffusion capacity since 2013.  He has history of emphysema and has been on amiodarone for a fib.  Will arrange for high resolution CT chest to further assess.  He has requested to contact his wife with results at [phone].

## 2014-01-14 NOTE — Progress Notes (Signed)
Chief Complaint  Patient presents with  . Follow-up    Pt states he is sleeping well and is wearing the CPAP every night averaging 6 hours. Denies issues with machine and pressure.     History of Present Illness: [name] is a 80 y.o. male former smoker with COPD/Emphysema, and OSA.  He had PFT in February.  This showed significant reduction in DLCO.  He has not had any imaging studies of his chest.  He has occasional cough with clear sputum.  He denies chest pain, wheeze, or hemoptysis.  He does weight training, and does not feel his breathing limits his activity level too much.    He uses symbicort bid >> he has to change to [place] due to insurance coverage.  He has been using albuterol nebulizer bid because he though he had to do this.  He uses his CPAP every night.  He feels rested.  He notices red marks on his face in the morning from his CPAP mask.  TESTS: PSG 06/14/06>>AHI 35.5  PFT 03/30/11>>FEV1 1.98(72%), FEV1% 68, DLCO 64%  PFT 04/19/12>>FEV1 1.65 (67%), FEV1% 63, TLC 5.66 (99%), DLCO 71%, no BD. Auto CPAP 12/20/12 to 01/02/13 >> Used on 11 of 14 nights with average 6 hrs 20 min. Average AHI 17 with median CPAP 10 cm H2O and 95th percentile CPAP 12 cm H2O. Mostly centrals. Echo 10/03/13 >> EF 40 to 45%, mod LVH, grade 1 diastolic dysfx  Auto CPAP 09/08/13 to 12/06/13 >> Used on 83 of 90 nights with average 6 hrs 53 min.  Average AHI 17.8 with median CPAP 9 cm H2O and 95 th percentile CPAP 12 cm H2O.  CI 9, OI 1.9, HI 6.5. PFT 12/19/13 >> FEV1 2.03 (77%), FEV1% 72, TLC 6.45 (98%), DLCO 49%   He  has a past medical history of Dyspnea; OSA (obstructive sleep apnea); HTN (hypertension); DM (diabetes mellitus), type 2; Hyperlipidemia; Systolic congestive heart failure (2009); Atrial fibrillation; Hypothyroidism; PNA (pneumonia); Cleft palate; Nasal septal deviation; Iron deficiency anemia; GERD (gastroesophageal reflux disease); Benign prostatic hypertrophy; Nephrolithiasis;  Psoriasis; Seborrheic keratosis; COPD with emphysema (04/01/2010); CAD (coronary artery disease); Ischemic dilated cardiomyopathy; and PAF (paroxysmal atrial fibrillation).  He  has past surgical history that includes Carpal tunnel release; left cleft palate and left cleft lip repair; Cataract extraction; c-spine surgery; Bi-ventricular implantable cardioverter defibrillator  (crt-d) (10-08-08; 11-06-2013); and Coronary artery bypass graft.   Outpatient Encounter Prescriptions as of 01/14/2014  Medication Sig  . albuterol (PROVENTIL HFA;VENTOLIN HFA) 108 (90 BASE) MCG/ACT inhaler Inhale 2 puffs into the lungs every 6 (six) hours as needed for wheezing or shortness of breath.  [name] albuterol (PROVENTIL) (2.5 MG/3ML) 0.083% nebulizer solution Take 3 mLs (2.5 mg total) by nebulization every 6 (six) hours as needed for wheezing.  [name] amiodarone (PACERONE) 200 MG tablet Take 200 mg by mouth daily.    [name] aspirin 81 MG tablet Take 81 mg by mouth daily.    [name] atorvastatin (LIPITOR) 80 MG tablet Take 1 tablet (80 mg total) by mouth daily.  . budesonide-formoterol (SYMBICORT) 160-4.5 MCG/ACT inhaler Inhale 2 puffs into the lungs 2 (two) times daily.  . carvedilol (COREG) 6.25 MG tablet 1 tablet by mouth twice a day  . ezetimibe (ZETIA) 10 MG tablet Take 10 mg by mouth daily.  . furosemide (LASIX) 40 MG tablet Take 40 mg by mouth 2 (two) times daily.  [name] HYDROcodone-acetaminophen (NORCO/VICODIN) 5-325 MG per tablet Take 1 tablet by mouth as needed for moderate pain.   [name]  insulin aspart (NOVOLOG) 100 UNIT/ML injection 25 units before breakfast and lunch, 30 units before supper  . insulin glargine (LANTUS SOLOSTAR) 100 UNIT/ML injection Inject 85 Units into the skin at bedtime.   [name] levothyroxine (SYNTHROID, LEVOTHROID) 75 MCG tablet Take 75 mcg by mouth daily.    . mometasone-formoterol (DULERA) 100-5 MCG/ACT AERO Inhale 2 puffs into the lungs 2 (two) times daily.  . montelukast (SINGULAIR) 10 MG tablet Take 10 mg by  mouth at bedtime.  . Multiple Vitamin (MULTIVITAMIN) tablet Take 1 tablet by mouth daily.    [name] omeprazole (PRILOSEC) 20 MG capsule Take 20 mg by mouth daily.    [name] spironolactone (ALDACTONE) 25 MG tablet Take 12.5 mg by mouth daily. 1/2 tablet once a day  . SYMBICORT 160-4.5 MCG/ACT inhaler INHALE 2 PUFFS IN THE LUNGS TWICE A DAY  . valsartan (DIOVAN) 80 MG tablet Take 80 mg by mouth daily.    [name] warfarin (COUMADIN) 5 MG tablet Take 5 mg by mouth daily. 7.5 mg on Thursday, 5 mg all other days.    No Known Allergies  Physical Exam:  General - No distress ENT - No sinus tenderness, no oral exudate, changes of cleft lip surger, nasal septal deviation, no LAN Cardiac - s1s2 regular, no murmur Chest - Decreased breath sounds, no wheeze/rales/dullness Back - No focal tenderness Abd - Soft, non-tender Ext - No edema Neuro - Normal strength Skin - No rashes Psych - normal mood, and behavior   Assessment/Plan:  [name] Mires, MD [facility] Pager:  [phone] 01/14/2014, 9:25 AM

## 2014-01-14 NOTE — Patient Instructions (Signed)
Will schedule CT chest and call with results Follow up in 6 to 8 weeks

## 2014-01-14 NOTE — Assessment & Plan Note (Signed)
He reports compliance with CPAP, and benefit from therapy.  Advised him to d/w his DME about mask fit.

## 2014-01-14 NOTE — Assessment & Plan Note (Signed)
He is to continue symbicort and prn albuterol.  Explained that he does not need to use albuterol on a regular basis, and reviewed when he should use albuterol. He is to continue singulair at night for asthmatic component.

## 2014-01-15 ENCOUNTER — Other Ambulatory Visit: Payer: Self-pay

## 2014-01-15 ENCOUNTER — Other Ambulatory Visit: Payer: Self-pay | Admitting: Pulmonary Disease

## 2014-01-15 MED ORDER — WARFARIN SODIUM 5 MG PO TABS: 5.0000 mg | ORAL_TABLET | Freq: Every day | ORAL | Status: DC

## 2014-01-17 ENCOUNTER — Encounter: Payer: Self-pay | Admitting: Pulmonary Disease

## 2014-01-17 ENCOUNTER — Ambulatory Visit (INDEPENDENT_AMBULATORY_CARE_PROVIDER_SITE_OTHER)
Admission: RE | Admit: 2014-01-17 | Discharge: 2014-01-17 | Disposition: A | Discharge status: Home / Self Care | Payer: Medicare Other | Source: Ambulatory Visit | Attending: Pulmonary Disease | Admitting: Pulmonary Disease

## 2014-01-17 DIAGNOSIS — J8410 Pulmonary fibrosis, unspecified: Secondary | ICD-10-CM | POA: Diagnosis not present

## 2014-01-17 DIAGNOSIS — R942 Abnormal results of pulmonary function studies: Secondary | ICD-10-CM

## 2014-01-24 ENCOUNTER — Telehealth: Payer: Self-pay | Admitting: Pulmonary Disease

## 2014-01-24 NOTE — Telephone Encounter (Signed)
Ct Chest High Resolution  01/18/2014    CLINICAL DATA:  Cough. Patient on amiodarone. Evaluate for potential interstitial lung disease.   EXAM: CHEST CT WITHOUT CONTRAST   TECHNIQUE: Multidetector CT imaging of the chest was performed following the standard protocol without intravenous contrast. High resolution imaging of the lungs, as well as inspiratory and expiratory imaging, was performed.   COMPARISON:  No priors.   FINDINGS:  Mediastinum:  Heart size is normal. There is no significant pericardial fluid, thickening or pericardial calcification. There is atherosclerosis of the thoracic aorta, the great vessels of the mediastinum and the coronary arteries, including calcified atherosclerotic plaque in the left main, left anterior descending, left circumflex and right coronary arteries. Status post median sternotomy for CABG, including LIMA to the LAD. No pathologically enlarged mediastinal or hilar lymph nodes. Please note that accurate exclusion of hilar adenopathy is limited on noncontrast CT scans. Esophagus is unremarkable in appearance. Left-sided biventricular pacemaker/AICD with lead tips terminating in the right atrium, right ventricular apex and overlying the lateral wall of the left ventricle via the coronary sinus and coronary veins. Lungs/Pleura:  Mild diffuse bronchial wall thickening. Mild centrilobular and paraseptal emphysema, most pronounced in the lung apices. Although there are some areas of peripheral ground-glass attenuation and a small amount of septal thickening in the right lung, these are favored to be related to remote trauma, as there are multiple overlying healed rib fractures. Throughout the remainder the lungs there is no significant area of ground-glass attenuation, subpleural reticulation, parenchymal banding, traction bronchiectasis or frank honeycombing to strongly suggest the presence of an interstitial lung disease at this time. No acute consolidative airspace  disease. No pleural effusions. No suspicious appearing pulmonary nodules or masses.   Upper Abdomen: Rim calcified gallstone in the gallbladder. No current findings to suggest acute cholecystitis at this time. Calcified granuloma in the right lobe of the liver.   Musculoskeletal:  Multiple old healed right-sided rib fractures. There are no aggressive appearing lytic or blastic lesions noted in the visualized portions of the skeleton. Sternotomy wires. Orthopedic fixation hardware in the lower cervical spine incompletely visualized.   IMPRESSION:  1. No definite findings to suggest interstitial lung disease at this time.  2. Mild diffuse bronchial wall thickening with mild centrilobular and paraseptal emphysema; imaging findings suggestive of underlying COPD.  3. There are some areas of very mild fibrosis in the periphery of the right lung, favored to be related to prior trauma, as there are numerous overlying healed right-sided rib fractures.  4. Atherosclerosis, including left main and 3 vessel coronary artery disease. Status post median sternotomy for CABG including LIMA to the LAD.  5. Cholelithiasis.  6. Additional incidental findings, as above.    Electronically Signed   By: Vinnie Langton M.D.   On: 01/18/2014 04:09    Will have my nurse inform pt that CT chest did not show evidence for amiodarone as cause of changes on recent breathing test.   Changes most likely related to emphysema.  No change to current treatment plan.  Please call pt's wife at following number to discuss results >> [phone].

## 2014-01-24 NOTE — Telephone Encounter (Signed)
Pts wife is aware of results.

## 2014-02-04 DIAGNOSIS — E1159 Type 2 diabetes mellitus with other circulatory complications: Secondary | ICD-10-CM | POA: Diagnosis not present

## 2014-02-14 ENCOUNTER — Other Ambulatory Visit: Payer: Self-pay | Admitting: *Deleted

## 2014-02-14 MED ORDER — AMIODARONE HCL 200 MG PO TABS: 200.0000 mg | ORAL_TABLET | Freq: Every day | ORAL | Status: DC

## 2014-02-14 MED ORDER — SPIRONOLACTONE 25 MG PO TABS: 12.5000 mg | ORAL_TABLET | Freq: Every day | ORAL | Status: DC

## 2014-02-14 MED ORDER — VALSARTAN 80 MG PO TABS: 80.0000 mg | ORAL_TABLET | Freq: Every day | ORAL | Status: DC

## 2014-02-15 ENCOUNTER — Encounter: Payer: Self-pay | Admitting: Internal Medicine

## 2014-02-15 ENCOUNTER — Ambulatory Visit (INDEPENDENT_AMBULATORY_CARE_PROVIDER_SITE_OTHER): Payer: Medicare Other | Admitting: Internal Medicine

## 2014-02-15 ENCOUNTER — Ambulatory Visit (INDEPENDENT_AMBULATORY_CARE_PROVIDER_SITE_OTHER): Payer: Medicare Other | Admitting: Pharmacist

## 2014-02-15 VITALS — BP 140/85 | HR 86 | Ht 68.0 in | Wt 251.0 lb

## 2014-02-15 DIAGNOSIS — I255 Ischemic cardiomyopathy: Secondary | ICD-10-CM

## 2014-02-15 DIAGNOSIS — I2510 Atherosclerotic heart disease of native coronary artery without angina pectoris: Secondary | ICD-10-CM

## 2014-02-15 DIAGNOSIS — I2589 Other forms of chronic ischemic heart disease: Secondary | ICD-10-CM

## 2014-02-15 DIAGNOSIS — Z7901 Long term (current) use of anticoagulants: Secondary | ICD-10-CM

## 2014-02-15 DIAGNOSIS — I5022 Chronic systolic (congestive) heart failure: Secondary | ICD-10-CM

## 2014-02-15 DIAGNOSIS — I4891 Unspecified atrial fibrillation: Secondary | ICD-10-CM | POA: Diagnosis not present

## 2014-02-15 DIAGNOSIS — I6789 Other cerebrovascular disease: Secondary | ICD-10-CM

## 2014-02-15 DIAGNOSIS — I480 Paroxysmal atrial fibrillation: Secondary | ICD-10-CM

## 2014-02-15 DIAGNOSIS — I420 Dilated cardiomyopathy: Secondary | ICD-10-CM

## 2014-02-15 DIAGNOSIS — Z95810 Presence of automatic (implantable) cardiac defibrillator: Secondary | ICD-10-CM | POA: Diagnosis not present

## 2014-02-15 DIAGNOSIS — Z5181 Encounter for therapeutic drug level monitoring: Secondary | ICD-10-CM

## 2014-02-15 LAB — MDC_IDC_ENUM_SESS_TYPE_INCLINIC
Battery Remaining Longevity: 83 mo
Battery Voltage: 3.08 V
Brady Statistic AP VP Percent: 97.23 %
Brady Statistic AP VS Percent: 1.15 %
Brady Statistic AS VP Percent: 1.6 %
Brady Statistic AS VS Percent: 0.02 %
Brady Statistic RA Percent Paced: 98.38 %
Brady Statistic RV Percent Paced: 97.82 %
Date Time Interrogation Session: 20150424100012
HighPow Impedance: 171 Ohm
HighPow Impedance: 47 Ohm
HighPow Impedance: 65 Ohm
Lead Channel Impedance Value: 1007 Ohm
Lead Channel Impedance Value: 475 Ohm
Lead Channel Impedance Value: 551 Ohm
Lead Channel Impedance Value: 589 Ohm
Lead Channel Impedance Value: 646 Ohm
Lead Channel Pacing Threshold Amplitude: 0.75 V
Lead Channel Pacing Threshold Amplitude: 0.75 V
Lead Channel Pacing Threshold Amplitude: 1.5 V
Lead Channel Pacing Threshold Pulse Width: 0.4 ms
Lead Channel Pacing Threshold Pulse Width: 0.4 ms
Lead Channel Pacing Threshold Pulse Width: 0.6 ms
Lead Channel Sensing Intrinsic Amplitude: 1.125 mV
Lead Channel Sensing Intrinsic Amplitude: 1.5 mV
Lead Channel Sensing Intrinsic Amplitude: 7.5 mV
Lead Channel Sensing Intrinsic Amplitude: 8.375 mV
Lead Channel Setting Pacing Amplitude: 1.5 V
Lead Channel Setting Pacing Amplitude: 2 V
Lead Channel Setting Pacing Amplitude: 3 V
Lead Channel Setting Pacing Pulse Width: 0.4 ms
Lead Channel Setting Pacing Pulse Width: 0.6 ms
Lead Channel Setting Sensing Sensitivity: 0.3 mV
Zone Setting Detection Interval: 300 ms
Zone Setting Detection Interval: 330 ms
Zone Setting Detection Interval: 330 ms
Zone Setting Detection Interval: 400 ms

## 2014-02-15 LAB — POCT INR: INR: 1.8

## 2014-02-15 NOTE — Patient Instructions (Signed)
Your physician recommends that you continue on your current medications as directed. Please refer to the Current Medication list given to you today.  Remote monitoring is used to monitor your Pacemaker of ICD from home. This monitoring reduces the number of office visits required to check your device to one time per year. It allows [place] to keep an eye on the functioning of your device to ensure it is working properly. You are scheduled for a device check from home on 05/20/14. You may send your transmission at any time that day. If you have a wireless device, the transmission will be sent automatically. After your physician reviews your transmission, you will receive a postcard with your next transmission date.  Your physician wants you to follow-up in: 1 year with [employer], PA-C.  You will receive a reminder letter in the mail two months in advance. If you don't receive a letter, please call our office to schedule the follow-up appointment.  Heather, RN will contact you about monitoring your heart failure status from home.

## 2014-02-15 NOTE — Progress Notes (Signed)
[name] Shelling, MD: Primary Cardiologist:  Dr Lenda Kelp is a 80 y.o. male with a h/o CAD, chronic systolic dysfunction and persistent afib sp BiV ICD (MDT) by Dr Leonia Reeves who presents today for follow-up in the Electrophysiology device clinic.   The patient reports doing very well since recent BiV ICD generator change.  He has chronic stable SOB.  He reports that activity is limited by backpain chronically.  Today, he  denies symptoms of palpitations, chest pain,   lower extremity edema, dizziness, presyncope, syncope, or neurologic sequela.  The patientis tolerating medications without difficulties and is otherwise without complaint today.   Past Medical History  Diagnosis Date  . Dyspnea   . OSA (obstructive sleep apnea)   . HTN (hypertension)   . DM (diabetes mellitus), type 2   . Hyperlipidemia   . Systolic congestive heart failure 2009    s/p BiV ICD implantation by Dr Leonia Reeves (MDT)  . Atrial fibrillation     persistent, previously seen at [facility] and placed on amiodarone  . Hypothyroidism   . PNA (pneumonia)   . Cleft palate   . Nasal septal deviation   . Iron deficiency anemia   . GERD (gastroesophageal reflux disease)   . Benign prostatic hypertrophy   . Nephrolithiasis   . Psoriasis   . Seborrheic keratosis   . COPD with emphysema 04/01/2010  . CAD (coronary artery disease)     multivessel s/p inferolateral wall MI with subsequent CABG 11/1998.  Cath 2009 with Patent grafts  . Ischemic dilated cardiomyopathy     EF 35-40% by MUGA 6/11  . PAF (paroxysmal atrial fibrillation)    Past Surgical History  Procedure Laterality Date  . Carpal tunnel release    . Left cleft palate and left cleft lip repair    . Cataract extraction    . C-spine surgery    . Bi-ventricular implantable cardioverter defibrillator  (crt-d)  10-08-08; 11-06-2013    Dr Leonia Reeves (MDT) implant for primary prevention; gen change to MDT VivaXT CRTD by Dr Rayann Heman  . Coronary artery bypass  graft      LIMA to LAD, SVG to OM, SVG to diagonal    History   Social History  . Marital Status: Married    Spouse Name: N/A    Number of Children: N/A  . Years of Education: N/A   Occupational History  . retired     Engineer, agricultural   Social History Main Topics  . Smoking status: Former Smoker -- 3.00 packs/day for 65 years    Types: Cigarettes    Quit date: 10/25/1998  . Smokeless tobacco: Never Used  . Alcohol Use: No     Comment: remote history of heavy alcohol use  . Drug Use: No  . Sexual Activity: Not on file   Other Topics Concern  . Not on file   Social History Narrative   Lives [place]   Retired    Family History  Problem Relation Age of Onset  . Asthma Father   . Stroke Father   . Heart disease Brother     No Known Allergies  Current Outpatient Prescriptions  Medication Sig Dispense Refill  . albuterol (PROVENTIL HFA;VENTOLIN HFA) 108 (90 BASE) MCG/ACT inhaler Inhale 2 puffs into the lungs every 6 (six) hours as needed for wheezing or shortness of breath.      [name] albuterol (PROVENTIL) (2.5 MG/3ML) 0.083% nebulizer solution Take 3 mLs (2.5 mg total) by nebulization every 6 (six) hours  as needed for wheezing.  300 mL  3  . amiodarone (PACERONE) 200 MG tablet Take 1 tablet (200 mg total) by mouth daily.  90 tablet  1  . aspirin 81 MG tablet Take 81 mg by mouth daily.        [name] atorvastatin (LIPITOR) 80 MG tablet Take 1 tablet (80 mg total) by mouth daily.  30 tablet  6  . carvedilol (COREG) 6.25 MG tablet 1 tablet by mouth twice a day  60 tablet  11  . ezetimibe (ZETIA) 10 MG tablet Take 10 mg by mouth daily.      . furosemide (LASIX) 40 MG tablet Take 40 mg by mouth 2 (two) times daily.      [name] HYDROcodone-acetaminophen (NORCO/VICODIN) 5-325 MG per tablet Take 1 tablet by mouth as needed for moderate pain.       [name] insulin aspart (NOVOLOG) 100 UNIT/ML injection 25 units before breakfast and lunch, 30 units before supper      . insulin glargine (LANTUS  SOLOSTAR) 100 UNIT/ML injection Inject 95 Units into the skin at bedtime.       [name] levothyroxine (SYNTHROID, LEVOTHROID) 75 MCG tablet Take 75 mcg by mouth daily.        . mometasone-formoterol (DULERA) 100-5 MCG/ACT AERO Inhale 2 puffs into the lungs 2 (two) times daily.  1 Inhaler  11  . montelukast (SINGULAIR) 10 MG tablet Take 10 mg by mouth at bedtime.      . Multiple Vitamin (MULTIVITAMIN) tablet Take 1 tablet by mouth daily.        [name] omeprazole (PRILOSEC) 20 MG capsule Take 20 mg by mouth daily.        [name] spironolactone (ALDACTONE) 25 MG tablet Take 12.5 mg by mouth daily.      . valsartan (DIOVAN) 80 MG tablet Take 1 tablet (80 mg total) by mouth daily.  90 tablet  1  . warfarin (COUMADIN) 5 MG tablet Take 1 tablet (5 mg total) by mouth daily. 7.5 mg on Thursday, 5 mg all other days.  40 tablet  3   No current facility-administered medications for this visit.     Physical Exam: Filed Vitals:   02/15/14 0913  BP: 140/85  Pulse: 86  Height: 5\' 8"  (1.727 m)  Weight: 251 lb (113.853 kg)    GEN- The patient is elderly appearing, alert and oriented x 3 today.   Head- normocephalic, atraumatic Eyes-  Sclera clear, conjunctiva pink Ears- hearing intact Oropharynx- clear Neck- supple, no JVP Lungs- Clear to ausculation bilaterally, normal work of breathing Chest- BiV ICD pocket is well healed Heart- Regular rate and rhythm, no murmurs, rubs or gallops, PMI not laterally displaced GI- soft, NT, ND, + BS Extremities- no clubbing, cyanosis, 1+ edema  ICD interrogation- reviewed in detail today,  See PACEART report ekg today reveals atrial and BiV pacing  Assessment and Plan:  1. Chronic systolic dysfunction Stable No change required today Normal BiV ICD function See Pace Art report No changes today Will enroll in the [facility] device [facility] with Alvis Lemmings   2. Afib Stable Continue long term anticoagulation with coumadin (CHADS2VASC score is at least 6).    3.  HTN Stable No change required today  carelink Return to see Jerene Pitch in the device clinic in 1 year

## 2014-02-21 ENCOUNTER — Encounter: Payer: Self-pay | Admitting: Pulmonary Disease

## 2014-02-21 ENCOUNTER — Ambulatory Visit (INDEPENDENT_AMBULATORY_CARE_PROVIDER_SITE_OTHER): Payer: Medicare Other | Admitting: Pulmonary Disease

## 2014-02-21 VITALS — BP 130/80 | HR 81 | Ht 68.0 in | Wt 255.0 lb

## 2014-02-21 DIAGNOSIS — R942 Abnormal results of pulmonary function studies: Secondary | ICD-10-CM | POA: Diagnosis not present

## 2014-02-21 DIAGNOSIS — I2510 Atherosclerotic heart disease of native coronary artery without angina pectoris: Secondary | ICD-10-CM

## 2014-02-21 DIAGNOSIS — J438 Other emphysema: Secondary | ICD-10-CM | POA: Diagnosis not present

## 2014-02-21 DIAGNOSIS — J439 Emphysema, unspecified: Secondary | ICD-10-CM

## 2014-02-21 DIAGNOSIS — G4733 Obstructive sleep apnea (adult) (pediatric): Secondary | ICD-10-CM

## 2014-02-21 NOTE — Patient Instructions (Signed)
Can stop singulair (montelukast) Continue dulera two puffs twice per day >> rinse mouth after each use Follow up in 6 months

## 2014-02-21 NOTE — Assessment & Plan Note (Signed)
He reports compliance with CPAP, and benefit from therapy.

## 2014-02-21 NOTE — Progress Notes (Signed)
Chief Complaint  Patient presents with  . COPD    Breathing is unchanged. Reports SOB, coughing. Denies chest tightness or wheezing.  . Sleep Apnea    Currently using CPAP every night. Denies problems with machine, mask or pressure.    History of Present Illness: [name] is a 80 y.o. male former smoker with COPD/Emphysema, and OSA.  His breathing has been doing okay.  He is not having much cough or wheeze.  He is doing well with CPAP.  He uses dulera bid.  He is not using albuterol much  TESTS: PSG 06/14/06>>AHI 35.5  PFT 03/30/11>>FEV1 1.98(72%), FEV1% 68, DLCO 64%  PFT 04/19/12>>FEV1 1.65 (67%), FEV1% 63, TLC 5.66 (99%), DLCO 71%, no BD. Echo 10/03/13 >> EF 40 to 45%, mod LVH, grade 1 diastolic dysfx  Auto CPAP 09/08/13 to 12/06/13 >> Used on 83 of 90 nights with average 6 hrs 53 min.  Average AHI 17.8 with median CPAP 9 cm H2O and 95 th percentile CPAP 12 cm H2O.  CI 9, OI 1.9, HI 6.5. PFT 12/19/13 >> FEV1 2.03 (77%), FEV1% 72, TLC 6.45 (98%), DLCO 49% CT chest 01/18/14 >> mild centrilobular and paraseptal emphysema, mild fibrotic changes Rt periphery   He  has a past medical history of Dyspnea; OSA (obstructive sleep apnea); HTN (hypertension); DM (diabetes mellitus), type 2; Hyperlipidemia; Systolic congestive heart failure (2009); Atrial fibrillation; Hypothyroidism; PNA (pneumonia); Cleft palate; Nasal septal deviation; Iron deficiency anemia; GERD (gastroesophageal reflux disease); Benign prostatic hypertrophy; Nephrolithiasis; Psoriasis; Seborrheic keratosis; COPD with emphysema (04/01/2010); CAD (coronary artery disease); Ischemic dilated cardiomyopathy; and PAF (paroxysmal atrial fibrillation).  He  has past surgical history that includes Carpal tunnel release; left cleft palate and left cleft lip repair; Cataract extraction; c-spine surgery; Bi-ventricular implantable cardioverter defibrillator  (crt-d) (10-08-08; 11-06-2013); and Coronary artery bypass  graft.   Outpatient Encounter Prescriptions as of 02/21/2014  Medication Sig  . albuterol (PROVENTIL HFA;VENTOLIN HFA) 108 (90 BASE) MCG/ACT inhaler Inhale 2 puffs into the lungs every 6 (six) hours as needed for wheezing or shortness of breath.  [name] albuterol (PROVENTIL) (2.5 MG/3ML) 0.083% nebulizer solution Take 3 mLs (2.5 mg total) by nebulization every 6 (six) hours as needed for wheezing.  [name] amiodarone (PACERONE) 200 MG tablet Take 1 tablet (200 mg total) by mouth daily.  [name] aspirin 81 MG tablet Take 81 mg by mouth daily.    [name] atorvastatin (LIPITOR) 80 MG tablet Take 1 tablet (80 mg total) by mouth daily.  . carvedilol (COREG) 6.25 MG tablet 1 tablet by mouth twice a day  . ezetimibe (ZETIA) 10 MG tablet Take 10 mg by mouth daily.  . furosemide (LASIX) 40 MG tablet Take 40 mg by mouth 2 (two) times daily.  [name] HYDROcodone-acetaminophen (NORCO/VICODIN) 5-325 MG per tablet Take 1 tablet by mouth as needed for moderate pain.   [name] insulin aspart (NOVOLOG) 100 UNIT/ML injection 25 units before breakfast and lunch, 30 units before supper  . insulin glargine (LANTUS SOLOSTAR) 100 UNIT/ML injection Inject 95 Units into the skin at bedtime.   [name] levothyroxine (SYNTHROID, LEVOTHROID) 75 MCG tablet Take 75 mcg by mouth daily.    . mometasone-formoterol (DULERA) 100-5 MCG/ACT AERO Inhale 2 puffs into the lungs 2 (two) times daily.  . montelukast (SINGULAIR) 10 MG tablet Take 10 mg by mouth at bedtime.  . Multiple Vitamin (MULTIVITAMIN) tablet Take 1 tablet by mouth daily.    [name] omeprazole (PRILOSEC) 20 MG capsule Take 20 mg by mouth daily.    [name]  spironolactone (ALDACTONE) 25 MG tablet Take 12.5 mg by mouth daily.  . valsartan (DIOVAN) 80 MG tablet Take 1 tablet (80 mg total) by mouth daily.  [name] warfarin (COUMADIN) 5 MG tablet Take 1 tablet (5 mg total) by mouth daily. 7.5 mg on Thursday, 5 mg all other days.    No Known Allergies  Physical Exam:  General - No distress ENT - No sinus tenderness, no oral  exudate, changes of cleft lip surger, nasal septal deviation, no LAN Cardiac - s1s2 regular, no murmur Chest - Decreased breath sounds, no wheeze/rales/dullness Back - No focal tenderness Abd - Soft, non-tender Ext - No edema Neuro - Normal strength Skin - No rashes Psych - normal mood, and behavior   Assessment/Plan:  Chesley Mires, MD [place] [facility] Pager:  [phone] 02/21/2014, 9:04 AM

## 2014-02-21 NOTE — Assessment & Plan Note (Signed)
Stable.  He is to continue dulera and prn albuterol.  Will have him stop singulair.

## 2014-02-21 NOTE — Assessment & Plan Note (Signed)
Related to emphysema.  No evidence for amiodarone toxicity recent CT chest as was concern.

## 2014-03-08 ENCOUNTER — Ambulatory Visit (INDEPENDENT_AMBULATORY_CARE_PROVIDER_SITE_OTHER): Payer: Medicare Other | Admitting: *Deleted

## 2014-03-08 DIAGNOSIS — I6789 Other cerebrovascular disease: Secondary | ICD-10-CM | POA: Diagnosis not present

## 2014-03-08 DIAGNOSIS — Z5181 Encounter for therapeutic drug level monitoring: Secondary | ICD-10-CM

## 2014-03-08 DIAGNOSIS — I4891 Unspecified atrial fibrillation: Secondary | ICD-10-CM

## 2014-03-08 DIAGNOSIS — Z7901 Long term (current) use of anticoagulants: Secondary | ICD-10-CM | POA: Diagnosis not present

## 2014-03-08 LAB — POCT INR: INR: 1.8

## 2014-03-21 ENCOUNTER — Telehealth: Payer: Self-pay | Admitting: *Deleted

## 2014-03-21 ENCOUNTER — Ambulatory Visit (INDEPENDENT_AMBULATORY_CARE_PROVIDER_SITE_OTHER): Payer: Medicare Other | Admitting: *Deleted

## 2014-03-21 ENCOUNTER — Encounter: Payer: Self-pay | Admitting: *Deleted

## 2014-03-21 DIAGNOSIS — I5022 Chronic systolic (congestive) heart failure: Secondary | ICD-10-CM

## 2014-03-21 DIAGNOSIS — Z95810 Presence of automatic (implantable) cardiac defibrillator: Secondary | ICD-10-CM | POA: Diagnosis not present

## 2014-03-21 NOTE — Telephone Encounter (Signed)
I spoke with the patient's wife.

## 2014-03-21 NOTE — Telephone Encounter (Signed)
I left a message for the patient's wife to call (he is [facility]) to discuss ICM fluid trends from transmission that was received today.

## 2014-03-21 NOTE — Progress Notes (Signed)
EPIC Encounter for ICM Monitoring  Patient Name: [name] is a 80 y.o. male Date: 03/21/2014 Primary Care Physican: Irven Shelling, MD Primary Cardiologist: Radford Pax Electrophysiologist: Allred Dry Weight: 250 lbs       In the past month, have you:  1. Gained more than 2 pounds in a day or more than 5 pounds in a week? no  2. Had changes in your medications (with verification of current medications)? no  3. Had more shortness of breath than is usual for you? no  4. Limited your activity because of shortness of breath? no  5. Not been able to sleep because of shortness of breath? no  6. Had increased swelling in your feet or ankles? no  7. Had symptoms of dehydration (dizziness, dry mouth, increased thirst, decreased urine output) no  8. Had changes in sodium restriction? no  9. Been compliant with medication? Yes   ICM trend:   Follow-up plan: ICM clinic phone appointment: 04/22/14. Optivol readings are slightly up this month, but do appear to be trending down. The patient is asymptomatic at this time. I have encouraged his wife to please call us if he has increased SOB, increased weights, or edema that develops. She voices understanding.  Copy of note sent to patient's primary care physician, primary cardiologist, and device following physician.  Emily Filbert, RN, BSN 03/21/2014 11:48 AM

## 2014-03-21 NOTE — Telephone Encounter (Signed)
Follow up ° ° ° ° ° °Returning [name]'s call °

## 2014-03-22 ENCOUNTER — Ambulatory Visit (INDEPENDENT_AMBULATORY_CARE_PROVIDER_SITE_OTHER): Payer: Medicare Other | Admitting: Surgery

## 2014-03-22 DIAGNOSIS — Z7901 Long term (current) use of anticoagulants: Secondary | ICD-10-CM | POA: Diagnosis not present

## 2014-03-22 DIAGNOSIS — I4891 Unspecified atrial fibrillation: Secondary | ICD-10-CM

## 2014-03-22 DIAGNOSIS — Z5181 Encounter for therapeutic drug level monitoring: Secondary | ICD-10-CM

## 2014-03-22 DIAGNOSIS — I6789 Other cerebrovascular disease: Secondary | ICD-10-CM

## 2014-03-22 LAB — POCT INR: INR: 1.9

## 2014-03-26 DIAGNOSIS — H26499 Other secondary cataract, unspecified eye: Secondary | ICD-10-CM | POA: Diagnosis not present

## 2014-03-26 DIAGNOSIS — H02839 Dermatochalasis of unspecified eye, unspecified eyelid: Secondary | ICD-10-CM | POA: Diagnosis not present

## 2014-03-26 DIAGNOSIS — E119 Type 2 diabetes mellitus without complications: Secondary | ICD-10-CM | POA: Diagnosis not present

## 2014-03-26 DIAGNOSIS — Z961 Presence of intraocular lens: Secondary | ICD-10-CM | POA: Diagnosis not present

## 2014-03-28 DIAGNOSIS — L570 Actinic keratosis: Secondary | ICD-10-CM | POA: Diagnosis not present

## 2014-03-28 DIAGNOSIS — D485 Neoplasm of uncertain behavior of skin: Secondary | ICD-10-CM | POA: Diagnosis not present

## 2014-04-05 ENCOUNTER — Ambulatory Visit (INDEPENDENT_AMBULATORY_CARE_PROVIDER_SITE_OTHER): Payer: Medicare Other

## 2014-04-05 DIAGNOSIS — Z5181 Encounter for therapeutic drug level monitoring: Secondary | ICD-10-CM

## 2014-04-05 DIAGNOSIS — I6789 Other cerebrovascular disease: Secondary | ICD-10-CM | POA: Diagnosis not present

## 2014-04-05 DIAGNOSIS — Z7901 Long term (current) use of anticoagulants: Secondary | ICD-10-CM | POA: Diagnosis not present

## 2014-04-05 DIAGNOSIS — I4891 Unspecified atrial fibrillation: Secondary | ICD-10-CM

## 2014-04-05 LAB — POCT INR: INR: 3.2

## 2014-04-15 DIAGNOSIS — E1159 Type 2 diabetes mellitus with other circulatory complications: Secondary | ICD-10-CM | POA: Diagnosis not present

## 2014-04-22 ENCOUNTER — Encounter: Payer: Medicare Other | Admitting: *Deleted

## 2014-04-29 ENCOUNTER — Other Ambulatory Visit (INDEPENDENT_AMBULATORY_CARE_PROVIDER_SITE_OTHER): Payer: Medicare Other

## 2014-04-29 ENCOUNTER — Ambulatory Visit (INDEPENDENT_AMBULATORY_CARE_PROVIDER_SITE_OTHER): Payer: Medicare Other

## 2014-04-29 DIAGNOSIS — E785 Hyperlipidemia, unspecified: Secondary | ICD-10-CM

## 2014-04-29 DIAGNOSIS — I6789 Other cerebrovascular disease: Secondary | ICD-10-CM

## 2014-04-29 DIAGNOSIS — Z5181 Encounter for therapeutic drug level monitoring: Secondary | ICD-10-CM | POA: Diagnosis not present

## 2014-04-29 DIAGNOSIS — I4891 Unspecified atrial fibrillation: Secondary | ICD-10-CM | POA: Diagnosis not present

## 2014-04-29 DIAGNOSIS — Z7901 Long term (current) use of anticoagulants: Secondary | ICD-10-CM | POA: Diagnosis not present

## 2014-04-29 LAB — ALT: ALT: 17 U/L (ref 0–53)

## 2014-04-29 LAB — POCT INR: INR: 2.3

## 2014-05-01 LAB — NMR LIPOPROFILE WITH LIPIDS
Cholesterol, Total: 123 mg/dL (ref <200)
HDL Particle Number: 33 umol/L (ref >=30.5)
HDL Size: 9.4 nm (ref >=9.2)
HDL-C: 45 mg/dL (ref >=40)
LDL (calc): 55 mg/dL (ref <100)
LDL Particle Number: 922 nmol/L (ref <1000)
LDL Size: 20.9 nm (ref >20.5)
LP-IR Score: 40 (ref <=45)
Large HDL-P: 5 umol/L (ref >=4.8)
Large VLDL-P: 1.7 nmol/L (ref <=2.7)
Small LDL Particle Number: 395 nmol/L (ref <=527)
Triglycerides: 114 mg/dL (ref <150)
VLDL Size: 44.6 nm (ref <=46.6)

## 2014-05-02 ENCOUNTER — Encounter: Payer: Self-pay | Admitting: General Surgery

## 2014-05-02 ENCOUNTER — Other Ambulatory Visit: Payer: Self-pay | Admitting: General Surgery

## 2014-05-02 DIAGNOSIS — E785 Hyperlipidemia, unspecified: Secondary | ICD-10-CM

## 2014-05-06 ENCOUNTER — Other Ambulatory Visit: Payer: Medicare Other

## 2014-05-14 ENCOUNTER — Other Ambulatory Visit: Payer: Self-pay | Admitting: Cardiology

## 2014-05-17 DIAGNOSIS — Z1331 Encounter for screening for depression: Secondary | ICD-10-CM | POA: Diagnosis not present

## 2014-05-17 DIAGNOSIS — I5022 Chronic systolic (congestive) heart failure: Secondary | ICD-10-CM | POA: Diagnosis not present

## 2014-05-17 DIAGNOSIS — I10 Essential (primary) hypertension: Secondary | ICD-10-CM | POA: Diagnosis not present

## 2014-05-17 DIAGNOSIS — N183 Chronic kidney disease, stage 3 unspecified: Secondary | ICD-10-CM | POA: Diagnosis not present

## 2014-05-17 DIAGNOSIS — E1129 Type 2 diabetes mellitus with other diabetic kidney complication: Secondary | ICD-10-CM | POA: Diagnosis not present

## 2014-05-17 DIAGNOSIS — E1139 Type 2 diabetes mellitus with other diabetic ophthalmic complication: Secondary | ICD-10-CM | POA: Diagnosis not present

## 2014-05-17 DIAGNOSIS — E039 Hypothyroidism, unspecified: Secondary | ICD-10-CM | POA: Diagnosis not present

## 2014-05-17 DIAGNOSIS — E1165 Type 2 diabetes mellitus with hyperglycemia: Secondary | ICD-10-CM | POA: Diagnosis not present

## 2014-05-17 DIAGNOSIS — G4733 Obstructive sleep apnea (adult) (pediatric): Secondary | ICD-10-CM | POA: Diagnosis not present

## 2014-05-19 ENCOUNTER — Other Ambulatory Visit: Payer: Self-pay

## 2014-05-19 MED ORDER — SPIRONOLACTONE 25 MG PO TABS: 12.5000 mg | ORAL_TABLET | Freq: Every day | ORAL | Status: DC

## 2014-05-20 ENCOUNTER — Encounter: Payer: Medicare Other | Admitting: *Deleted

## 2014-05-20 ENCOUNTER — Telehealth: Payer: Self-pay | Admitting: Cardiology

## 2014-05-20 NOTE — Telephone Encounter (Signed)
Confirmed remote transmission with pt wife. Pt to call back later.

## 2014-05-21 ENCOUNTER — Encounter: Payer: Self-pay | Admitting: Cardiology

## 2014-05-30 ENCOUNTER — Ambulatory Visit (INDEPENDENT_AMBULATORY_CARE_PROVIDER_SITE_OTHER): Payer: Medicare Other | Admitting: *Deleted

## 2014-05-30 DIAGNOSIS — Z5181 Encounter for therapeutic drug level monitoring: Secondary | ICD-10-CM | POA: Diagnosis not present

## 2014-05-30 DIAGNOSIS — I6789 Other cerebrovascular disease: Secondary | ICD-10-CM

## 2014-05-30 DIAGNOSIS — Z7901 Long term (current) use of anticoagulants: Secondary | ICD-10-CM

## 2014-05-30 DIAGNOSIS — I4891 Unspecified atrial fibrillation: Secondary | ICD-10-CM | POA: Diagnosis not present

## 2014-05-30 LAB — POCT INR: INR: 2.1

## 2014-06-03 ENCOUNTER — Encounter: Payer: Self-pay | Admitting: Internal Medicine

## 2014-06-03 ENCOUNTER — Encounter: Payer: Self-pay | Admitting: *Deleted

## 2014-06-03 ENCOUNTER — Ambulatory Visit (INDEPENDENT_AMBULATORY_CARE_PROVIDER_SITE_OTHER): Payer: Medicare Other | Admitting: *Deleted

## 2014-06-03 DIAGNOSIS — I420 Dilated cardiomyopathy: Secondary | ICD-10-CM

## 2014-06-03 DIAGNOSIS — I2589 Other forms of chronic ischemic heart disease: Secondary | ICD-10-CM

## 2014-06-03 DIAGNOSIS — Z95810 Presence of automatic (implantable) cardiac defibrillator: Secondary | ICD-10-CM | POA: Diagnosis not present

## 2014-06-03 DIAGNOSIS — I255 Ischemic cardiomyopathy: Secondary | ICD-10-CM

## 2014-06-03 DIAGNOSIS — I5022 Chronic systolic (congestive) heart failure: Secondary | ICD-10-CM | POA: Diagnosis not present

## 2014-06-03 NOTE — Progress Notes (Signed)
EPIC Encounter for ICM Monitoring  Patient Name: [name] is a 80 y.o. male Date: 06/03/2014 Primary Care Physican: Irven Shelling, MD Primary Cardiologist: Radford Pax Electrophysiologist: Allred Dry Weight: 250 lbs       In the past month, have you:  1. Gained more than 2 pounds in a day or more than 5 pounds in a week? no  2. Had changes in your medications (with verification of current medications)? Insulin dose has been adjusted and thyroid medication increased to 88 mcg daily.  3. Had more shortness of breath than is usual for you? no  4. Limited your activity because of shortness of breath? no  5. Not been able to sleep because of shortness of breath? no  6. Had increased swelling in your feet or ankles? no  7. Had symptoms of dehydration (dizziness, dry mouth, increased thirst, decreased urine output) no  8. Had changes in sodium restriction? no  9. Been compliant with medication? Yes   ICM trend:   Follow-up plan: ICM clinic phone appointment: 07/04/14  Copy of note sent to patient's primary care physician, primary cardiologist, and device following physician.  Alvis Lemmings, RN, BSN 06/03/2014 9:49 AM

## 2014-06-03 NOTE — Progress Notes (Signed)
Remote ICD transmission.   

## 2014-06-14 LAB — MDC_IDC_ENUM_SESS_TYPE_REMOTE
Battery Remaining Longevity: 79 mo
Battery Voltage: 3.02 V
Brady Statistic AP VP Percent: 97.49 %
Brady Statistic AP VS Percent: 1.01 %
Brady Statistic AS VP Percent: 1.48 %
Brady Statistic AS VS Percent: 0.02 %
Brady Statistic RA Percent Paced: 98.5 %
Brady Statistic RV Percent Paced: 98.06 %
Date Time Interrogation Session: 20150810113732
HighPow Impedance: 171 Ohm
HighPow Impedance: 47 Ohm
HighPow Impedance: 59 Ohm
Lead Channel Impedance Value: 456 Ohm
Lead Channel Impedance Value: 551 Ohm
Lead Channel Impedance Value: 589 Ohm
Lead Channel Impedance Value: 589 Ohm
Lead Channel Impedance Value: 988 Ohm
Lead Channel Pacing Threshold Amplitude: 0.625 V
Lead Channel Pacing Threshold Amplitude: 0.75 V
Lead Channel Pacing Threshold Amplitude: 1 V
Lead Channel Pacing Threshold Pulse Width: 0.4 ms
Lead Channel Pacing Threshold Pulse Width: 0.4 ms
Lead Channel Pacing Threshold Pulse Width: 0.6 ms
Lead Channel Sensing Intrinsic Amplitude: 1.875 mV
Lead Channel Sensing Intrinsic Amplitude: 1.875 mV
Lead Channel Sensing Intrinsic Amplitude: 8.75 mV
Lead Channel Sensing Intrinsic Amplitude: 8.75 mV
Lead Channel Setting Pacing Amplitude: 1.5 V
Lead Channel Setting Pacing Amplitude: 2 V
Lead Channel Setting Pacing Amplitude: 2.75 V
Lead Channel Setting Pacing Pulse Width: 0.4 ms
Lead Channel Setting Pacing Pulse Width: 0.6 ms
Lead Channel Setting Sensing Sensitivity: 0.3 mV
Zone Setting Detection Interval: 300 ms
Zone Setting Detection Interval: 330 ms
Zone Setting Detection Interval: 330 ms
Zone Setting Detection Interval: 400 ms

## 2014-06-25 ENCOUNTER — Encounter: Payer: Self-pay | Admitting: Cardiology

## 2014-07-04 ENCOUNTER — Ambulatory Visit (INDEPENDENT_AMBULATORY_CARE_PROVIDER_SITE_OTHER): Payer: Medicare Other | Admitting: *Deleted

## 2014-07-04 ENCOUNTER — Ambulatory Visit (INDEPENDENT_AMBULATORY_CARE_PROVIDER_SITE_OTHER): Payer: Medicare Other

## 2014-07-04 DIAGNOSIS — Z95810 Presence of automatic (implantable) cardiac defibrillator: Secondary | ICD-10-CM | POA: Diagnosis not present

## 2014-07-04 DIAGNOSIS — Z7901 Long term (current) use of anticoagulants: Secondary | ICD-10-CM

## 2014-07-04 DIAGNOSIS — I6789 Other cerebrovascular disease: Secondary | ICD-10-CM

## 2014-07-04 DIAGNOSIS — I4891 Unspecified atrial fibrillation: Secondary | ICD-10-CM

## 2014-07-04 DIAGNOSIS — Z5181 Encounter for therapeutic drug level monitoring: Secondary | ICD-10-CM | POA: Diagnosis not present

## 2014-07-04 DIAGNOSIS — I5022 Chronic systolic (congestive) heart failure: Secondary | ICD-10-CM | POA: Diagnosis not present

## 2014-07-04 LAB — POCT INR: INR: 3.4

## 2014-07-08 ENCOUNTER — Telehealth: Payer: Self-pay | Admitting: *Deleted

## 2014-07-08 ENCOUNTER — Encounter: Payer: Self-pay | Admitting: *Deleted

## 2014-07-08 NOTE — Telephone Encounter (Signed)
ICM transmission received. I left a message for the patient or his wife to call. 

## 2014-07-08 NOTE — Progress Notes (Signed)
EPIC Encounter for ICM Monitoring  Patient Name: [name] is a 80 y.o. male Date: 07/08/2014 Primary Care Physican: Irven Shelling, MD Primary Cardiologist: Radford Pax Electrophysiologist: Allred Dry Weight: 250 lbs       In the past month, have you:  1. Gained more than 2 pounds in a day or more than 5 pounds in a week? no  2. Had changes in your medications (with verification of current medications)? no  3. Had more shortness of breath than is usual for you? Yes. Per the patient's wife, he seems to have more labored breathing and wheezing. This is not new, but is becoming progressively worse for the patient. He does use Dulera twice daily and has PRN Albuteral nebs and inhaler. He is reluctant to use his PRN meds per his wife. He is on C-PAP at night and uses this faithfully. His pulmonologist is Dr. Halford Chessman.  4. Limited your activity because of shortness of breath? no  5. Not been able to sleep because of shortness of breath? no  6. Had increased swelling in your feet or ankles? No. Has had intermittent lower extremity edema which is not new.   7. Had symptoms of dehydration (dizziness, dry mouth, increased thirst, decreased urine output) no  8. Had changes in sodium restriction? no  9. Been compliant with medication? Yes   ICM trend:   Follow-up plan: ICM clinic phone appointment: 08/08/14  Copy of note sent to patient's primary care physician, primary cardiologist, and device following physician.  Alvis Lemmings, RN, BSN 07/08/2014 11:30 AM

## 2014-07-08 NOTE — Telephone Encounter (Signed)
I spoke with the patient's wife.

## 2014-07-08 NOTE — Telephone Encounter (Signed)
Follow up     Returned Heather's call

## 2014-08-01 ENCOUNTER — Ambulatory Visit (INDEPENDENT_AMBULATORY_CARE_PROVIDER_SITE_OTHER): Payer: Medicare Other

## 2014-08-01 DIAGNOSIS — Z5181 Encounter for therapeutic drug level monitoring: Secondary | ICD-10-CM

## 2014-08-01 DIAGNOSIS — Z7901 Long term (current) use of anticoagulants: Secondary | ICD-10-CM

## 2014-08-01 DIAGNOSIS — I6789 Other cerebrovascular disease: Secondary | ICD-10-CM | POA: Diagnosis not present

## 2014-08-01 DIAGNOSIS — I4891 Unspecified atrial fibrillation: Secondary | ICD-10-CM | POA: Diagnosis not present

## 2014-08-01 LAB — POCT INR: INR: 2.1

## 2014-08-07 ENCOUNTER — Other Ambulatory Visit: Payer: Self-pay | Admitting: Cardiology

## 2014-08-07 ENCOUNTER — Other Ambulatory Visit: Payer: Self-pay | Admitting: Pulmonary Disease

## 2014-08-08 ENCOUNTER — Ambulatory Visit (INDEPENDENT_AMBULATORY_CARE_PROVIDER_SITE_OTHER): Payer: Medicare Other | Admitting: *Deleted

## 2014-08-08 ENCOUNTER — Encounter: Payer: Self-pay | Admitting: *Deleted

## 2014-08-08 ENCOUNTER — Other Ambulatory Visit: Payer: Self-pay | Admitting: Cardiology

## 2014-08-08 DIAGNOSIS — I5022 Chronic systolic (congestive) heart failure: Secondary | ICD-10-CM

## 2014-08-08 DIAGNOSIS — Z95810 Presence of automatic (implantable) cardiac defibrillator: Secondary | ICD-10-CM | POA: Diagnosis not present

## 2014-08-08 NOTE — Progress Notes (Signed)
EPIC Encounter for ICM Monitoring  Patient Name: [name] is a 80 y.o. male Date: 08/08/2014 Primary Care Physican: Irven Shelling, MD Primary Cardiologist: Radford Pax Electrophysiologist: Allred Dry Weight: 250 lbs       In the past month, have you:  1. Gained more than 2 pounds in a day or more than 5 pounds in a week? no  2. Had changes in your medications (with verification of current medications)? no  3. Had more shortness of breath than is usual for you? no  4. Limited your activity because of shortness of breath? no  5. Not been able to sleep because of shortness of breath? no  6. Had increased swelling in your feet or ankles? no  7. Had symptoms of dehydration (dizziness, dry mouth, increased thirst, decreased urine output) no  8. Had changes in sodium restriction? no  9. Been compliant with medication? Yes   ICM trend:   Follow-up plan: ICM clinic phone appointment: 09/09/14  Copy of note sent to patient's primary care physician, primary cardiologist, and device following physician.  Alvis Lemmings, RN, BSN 08/08/2014 2:07 PM

## 2014-08-19 DIAGNOSIS — E1151 Type 2 diabetes mellitus with diabetic peripheral angiopathy without gangrene: Secondary | ICD-10-CM | POA: Diagnosis not present

## 2014-08-20 ENCOUNTER — Other Ambulatory Visit: Payer: Self-pay | Admitting: Pulmonary Disease

## 2014-08-20 DIAGNOSIS — Z23 Encounter for immunization: Secondary | ICD-10-CM | POA: Diagnosis not present

## 2014-08-26 ENCOUNTER — Other Ambulatory Visit: Payer: Self-pay

## 2014-08-26 MED ORDER — EZETIMIBE 10 MG PO TABS: 10.0000 mg | ORAL_TABLET | Freq: Every day | ORAL | Status: DC

## 2014-08-26 MED ORDER — VALSARTAN 80 MG PO TABS: 80.0000 mg | ORAL_TABLET | Freq: Every day | ORAL | Status: DC

## 2014-09-05 ENCOUNTER — Ambulatory Visit (INDEPENDENT_AMBULATORY_CARE_PROVIDER_SITE_OTHER): Payer: Medicare Other | Admitting: *Deleted

## 2014-09-05 DIAGNOSIS — I4891 Unspecified atrial fibrillation: Secondary | ICD-10-CM | POA: Diagnosis not present

## 2014-09-05 DIAGNOSIS — Z5181 Encounter for therapeutic drug level monitoring: Secondary | ICD-10-CM | POA: Diagnosis not present

## 2014-09-05 DIAGNOSIS — I6789 Other cerebrovascular disease: Secondary | ICD-10-CM | POA: Diagnosis not present

## 2014-09-05 DIAGNOSIS — Z7901 Long term (current) use of anticoagulants: Secondary | ICD-10-CM | POA: Diagnosis not present

## 2014-09-05 LAB — POCT INR: INR: 2.2

## 2014-09-06 DIAGNOSIS — M1711 Unilateral primary osteoarthritis, right knee: Secondary | ICD-10-CM | POA: Diagnosis not present

## 2014-09-08 ENCOUNTER — Other Ambulatory Visit: Payer: Self-pay | Admitting: Cardiology

## 2014-09-08 ENCOUNTER — Other Ambulatory Visit: Payer: Self-pay | Admitting: Internal Medicine

## 2014-09-08 ENCOUNTER — Other Ambulatory Visit: Payer: Self-pay | Admitting: Pulmonary Disease

## 2014-09-09 ENCOUNTER — Encounter: Payer: Self-pay | Admitting: Internal Medicine

## 2014-09-09 ENCOUNTER — Ambulatory Visit (INDEPENDENT_AMBULATORY_CARE_PROVIDER_SITE_OTHER): Payer: Medicare Other | Admitting: *Deleted

## 2014-09-09 DIAGNOSIS — I5022 Chronic systolic (congestive) heart failure: Secondary | ICD-10-CM | POA: Diagnosis not present

## 2014-09-09 DIAGNOSIS — I255 Ischemic cardiomyopathy: Secondary | ICD-10-CM

## 2014-09-09 DIAGNOSIS — Z95810 Presence of automatic (implantable) cardiac defibrillator: Secondary | ICD-10-CM

## 2014-09-09 DIAGNOSIS — I420 Dilated cardiomyopathy: Principal | ICD-10-CM

## 2014-09-09 NOTE — Progress Notes (Signed)
Remote ICD transmission.   

## 2014-09-10 LAB — MDC_IDC_ENUM_SESS_TYPE_REMOTE
Battery Remaining Longevity: 86 mo
Battery Voltage: 3.01 V
Brady Statistic AP VP Percent: 97.92 %
Brady Statistic AP VS Percent: 1.01 %
Brady Statistic AS VP Percent: 1.06 %
Brady Statistic AS VS Percent: 0.01 %
Brady Statistic RA Percent Paced: 98.93 %
Brady Statistic RV Percent Paced: 98.23 %
Date Time Interrogation Session: 20151116052204
HighPow Impedance: 49 Ohm
HighPow Impedance: 67 Ohm
Lead Channel Impedance Value: 1026 Ohm
Lead Channel Impedance Value: 513 Ohm
Lead Channel Impedance Value: 608 Ohm
Lead Channel Impedance Value: 608 Ohm
Lead Channel Impedance Value: 665 Ohm
Lead Channel Impedance Value: 665 Ohm
Lead Channel Pacing Threshold Amplitude: 0.75 V
Lead Channel Pacing Threshold Amplitude: 0.75 V
Lead Channel Pacing Threshold Amplitude: 1.25 V
Lead Channel Pacing Threshold Pulse Width: 0.4 ms
Lead Channel Pacing Threshold Pulse Width: 0.4 ms
Lead Channel Pacing Threshold Pulse Width: 0.6 ms
Lead Channel Sensing Intrinsic Amplitude: 1.625 mV
Lead Channel Sensing Intrinsic Amplitude: 1.625 mV
Lead Channel Sensing Intrinsic Amplitude: 9 mV
Lead Channel Sensing Intrinsic Amplitude: 9 mV
Lead Channel Setting Pacing Amplitude: 1.5 V
Lead Channel Setting Pacing Amplitude: 2 V
Lead Channel Setting Pacing Amplitude: 2.5 V
Lead Channel Setting Pacing Pulse Width: 0.4 ms
Lead Channel Setting Pacing Pulse Width: 0.6 ms
Lead Channel Setting Sensing Sensitivity: 0.3 mV
Zone Setting Detection Interval: 300 ms
Zone Setting Detection Interval: 330 ms
Zone Setting Detection Interval: 330 ms
Zone Setting Detection Interval: 400 ms

## 2014-09-11 ENCOUNTER — Encounter: Payer: Self-pay | Admitting: *Deleted

## 2014-09-11 NOTE — Addendum Note (Signed)
Addended by: Alvis Lemmings C on: 09/11/2014 01:28 PM   Modules accepted: Level of Service

## 2014-09-11 NOTE — Progress Notes (Signed)
EPIC Encounter for ICM Monitoring  Patient Name: [name] is a 80 y.o. male Date: 09/11/2014 Primary Care Physican: Irven Shelling, MD Primary Cardiologist: Radford Pax Electrophysiologist: Allred Dry Weight: 250 lbs       In the past month, have you:  1. Gained more than 2 pounds in a day or more than 5 pounds in a week? no  2. Had changes in your medications (with verification of current medications)? no  3. Had more shortness of breath than is usual for you? Yes- per the patient's wife, he has had 2 episodes in the last 2 weeks where he feels somewhat more SOB than his baseline, but mostly complains of fatigue. She is concerned that his O2 levels may be running low. She has not noticed any discoloration around the lips/ fingers (nails) when he is complaining of this. She will monitor for these things. The patient has an appointment with his PCP on Friday.   4. Limited your activity because of shortness of breath? no  5. Not been able to sleep because of shortness of breath? no  6. Had increased swelling in your feet or ankles? no  7. Had symptoms of dehydration (dizziness, dry mouth, increased thirst, decreased urine output) no  8. Had changes in sodium restriction? no  9. Been compliant with medication? Yes   ICM trend:   Follow-up plan: ICM clinic phone appointment: 10/14/14  Copy of note sent to patient's primary care physician, primary cardiologist, and device following physician.  Alvis Lemmings, RN, BSN 09/11/2014 1:22 PM

## 2014-09-12 NOTE — Progress Notes (Signed)
Please have him come in to see PA tomorrow or go to ER is symptoms worsen

## 2014-09-13 DIAGNOSIS — E08329 Diabetes mellitus due to underlying condition with mild nonproliferative diabetic retinopathy without macular edema: Secondary | ICD-10-CM | POA: Diagnosis not present

## 2014-09-13 DIAGNOSIS — E109 Type 1 diabetes mellitus without complications: Secondary | ICD-10-CM | POA: Diagnosis not present

## 2014-09-13 DIAGNOSIS — E1151 Type 2 diabetes mellitus with diabetic peripheral angiopathy without gangrene: Secondary | ICD-10-CM | POA: Diagnosis not present

## 2014-09-13 DIAGNOSIS — E1122 Type 2 diabetes mellitus with diabetic chronic kidney disease: Secondary | ICD-10-CM | POA: Diagnosis not present

## 2014-09-13 DIAGNOSIS — K219 Gastro-esophageal reflux disease without esophagitis: Secondary | ICD-10-CM | POA: Diagnosis not present

## 2014-09-13 DIAGNOSIS — I129 Hypertensive chronic kidney disease with stage 1 through stage 4 chronic kidney disease, or unspecified chronic kidney disease: Secondary | ICD-10-CM | POA: Diagnosis not present

## 2014-09-13 DIAGNOSIS — J449 Chronic obstructive pulmonary disease, unspecified: Secondary | ICD-10-CM | POA: Diagnosis not present

## 2014-09-13 DIAGNOSIS — N183 Chronic kidney disease, stage 3 (moderate): Secondary | ICD-10-CM | POA: Diagnosis not present

## 2014-09-16 ENCOUNTER — Encounter: Payer: Self-pay | Admitting: Cardiology

## 2014-09-23 DIAGNOSIS — E039 Hypothyroidism, unspecified: Secondary | ICD-10-CM | POA: Diagnosis not present

## 2014-09-23 DIAGNOSIS — E11329 Type 2 diabetes mellitus with mild nonproliferative diabetic retinopathy without macular edema: Secondary | ICD-10-CM | POA: Diagnosis not present

## 2014-09-23 DIAGNOSIS — E780 Pure hypercholesterolemia: Secondary | ICD-10-CM | POA: Diagnosis not present

## 2014-09-24 ENCOUNTER — Encounter: Payer: Self-pay | Admitting: Interventional Cardiology

## 2014-09-24 ENCOUNTER — Ambulatory Visit (INDEPENDENT_AMBULATORY_CARE_PROVIDER_SITE_OTHER): Payer: Medicare Other | Admitting: Interventional Cardiology

## 2014-09-24 ENCOUNTER — Ambulatory Visit (INDEPENDENT_AMBULATORY_CARE_PROVIDER_SITE_OTHER): Payer: Medicare Other

## 2014-09-24 VITALS — BP 98/60 | HR 93 | Ht 68.0 in | Wt 245.6 lb

## 2014-09-24 DIAGNOSIS — I10 Essential (primary) hypertension: Secondary | ICD-10-CM

## 2014-09-24 DIAGNOSIS — I2510 Atherosclerotic heart disease of native coronary artery without angina pectoris: Secondary | ICD-10-CM | POA: Diagnosis not present

## 2014-09-24 DIAGNOSIS — Z7901 Long term (current) use of anticoagulants: Secondary | ICD-10-CM

## 2014-09-24 DIAGNOSIS — I739 Peripheral vascular disease, unspecified: Secondary | ICD-10-CM | POA: Diagnosis not present

## 2014-09-24 DIAGNOSIS — I6789 Other cerebrovascular disease: Secondary | ICD-10-CM

## 2014-09-24 DIAGNOSIS — I4891 Unspecified atrial fibrillation: Secondary | ICD-10-CM

## 2014-09-24 DIAGNOSIS — Z5181 Encounter for therapeutic drug level monitoring: Secondary | ICD-10-CM

## 2014-09-24 LAB — POCT INR: INR: 2

## 2014-09-24 NOTE — Patient Instructions (Signed)
Your physician recommends that you continue on your current medications as directed. Please refer to the Current Medication list given to you today. Your physician has requested that you have a carotid duplex. This test is an ultrasound of the carotid arteries in your neck. It looks at blood flow through these arteries that supply the brain with blood. Allow one hour for this exam. There are no restrictions or special instructions.  Your physician has requested that you have an upper extremity arterial duplex. This test is an ultrasound of the arteries in the arms. It looks at arterial blood flow in the arms. Allow one hour for Upper Arterial scans. There are no restrictions or special instructions  Your physician wants you to follow-up in: 1 year with [name].  You will receive a reminder letter in the mail two months in advance. If you don't receive a letter, please call our office to schedule the follow-up appointment.

## 2014-09-24 NOTE — Progress Notes (Signed)
Patient ID: [name], male   DOB: 07/20/1934, 80 y.o.   MRN: [id number] Patient ID: [name], male   DOB: [birth year], 80 y.o.   MRN: [id number]    [place], [place], [place]  [address] Phone: [phone] Fax:  [phone]  Date:  09/24/2014   ID:  [name], DOB Jan 11, 1934, MRN [id number]  PCP:  Irven Shelling, MD      History of Present Illness: [name] is a 80 y.o. male  who has CAD and COPD. He has a left subclavian stenosis. No arm claudication.  Occasional numbness at night- see below ROS. Has persistent SHOB. He thinks this is rellated to his emphysema. he also has chronic back pain which limits his ability to exercise.   No lightheadedness or syncope. No problems such as this while using his left arm.    DOE worse with walking up stairs.  Back pain also worse with moving around.  He notes intermittent wheezing. He will see his pulmonologist soon.       Wt Readings from Last 3 Encounters:  09/24/14 245 lb 9.6 oz (111.403 kg)  02/21/14 255 lb (115.667 kg)  02/15/14 251 lb (113.853 kg)     Past Medical History  Diagnosis Date  . Dyspnea   . OSA (obstructive sleep apnea)   . HTN (hypertension)   . DM (diabetes mellitus), type 2   . Hyperlipidemia   . Systolic congestive heart failure 2009    s/p BiV ICD implantation by Dr Leonia Reeves ([facility])  . Atrial fibrillation     persistent, previously seen at [facility] and placed on amiodarone  . Hypothyroidism   . PNA (pneumonia)   . Cleft palate   . Nasal septal deviation   . Iron deficiency anemia   . GERD (gastroesophageal reflux disease)   . Benign prostatic hypertrophy   . Nephrolithiasis   . Psoriasis   . Seborrheic keratosis   . COPD with emphysema 04/01/2010  . CAD (coronary artery disease)     multivessel s/p inferolateral wall MI with subsequent CABG 11/1998.  Cath 2009 with Patent grafts  . Ischemic dilated cardiomyopathy     EF 35-40% by MUGA 6/11  . PAF (paroxysmal  atrial fibrillation)     Current Outpatient Prescriptions  Medication Sig Dispense Refill  . albuterol (PROVENTIL HFA;VENTOLIN HFA) 108 (90 BASE) MCG/ACT inhaler Inhale 2 puffs into the lungs every 6 (six) hours as needed for wheezing or shortness of breath.    [name] albuterol (PROVENTIL) (2.5 MG/3ML) 0.083% nebulizer solution USE 1 VIAL VIA NEBULIZER 4 TIMES A DAY AS DIRECTED 300 mL 1  . amiodarone (PACERONE) 200 MG tablet Take 1 tablet (200 mg total) by mouth daily. 90 tablet 1  . aspirin 81 MG tablet Take 81 mg by mouth daily.      [name] atorvastatin (LIPITOR) 80 MG tablet TAKE 1 TABLET (80 MG TOTAL) BY MOUTH DAILY. (Patient taking differently: take 160 mg by mouth daily) 30 tablet 5  . carvedilol (COREG) 6.25 MG tablet 1 tablet by mouth twice a day 60 tablet 11  . ezetimibe (ZETIA) 10 MG tablet Take 1 tablet (10 mg total) by mouth daily. 90 tablet 1  . furosemide (LASIX) 40 MG tablet Take 40 mg by mouth 2 (two) times daily.    [name] HYDROcodone-acetaminophen (NORCO/VICODIN) 5-325 MG per tablet Take 1 tablet by mouth as needed for moderate pain.     [name] insulin aspart (NOVOLOG) 100 UNIT/ML  injection 25 units before breakfast and lunch, 30 units before supper    . insulin glargine (LANTUS SOLOSTAR) 100 UNIT/ML injection Inject 95 Units into the skin at bedtime.     [name] levothyroxine (SYNTHROID, LEVOTHROID) 88 MCG tablet Take 88 mcg by mouth daily before breakfast.    . mometasone-formoterol (DULERA) 100-5 MCG/ACT AERO Inhale 2 puffs into the lungs 2 (two) times daily. 1 Inhaler 11  . montelukast (SINGULAIR) 10 MG tablet TAKE 1 TABLET BY MOUTH AT BEDTIME 30 tablet 0  . Multiple Vitamin (MULTIVITAMIN) tablet Take 1 tablet by mouth daily.      [name] omeprazole (PRILOSEC) 20 MG capsule Take 20 mg by mouth daily.      [name] spironolactone (ALDACTONE) 25 MG tablet Take 0.5 tablets (12.5 mg total) by mouth daily. 45 tablet 2  . valsartan (DIOVAN) 80 MG tablet Take 1 tablet (80 mg total) by mouth daily. 90 tablet 1  .  warfarin (COUMADIN) 5 MG tablet TAKE AS DIRECTED BY COUMADIN CLINIC 40 tablet 2   No current facility-administered medications for this visit.    Allergies:   No Known Allergies  Social History:  The patient  reports that he quit smoking about 15 years ago. His smoking use included Cigarettes. He has a 195 pack-year smoking history. He has never used smokeless tobacco. He reports that he does not drink alcohol or use illicit drugs.   Family History:  The patient's family history includes Asthma in his father; Heart disease in his brother; Stroke in his father.   ROS:  Please see the history of present illness.  No nausea, vomiting.  No fevers, chills.  No focal weakness.  No dysuria.  Left arm numbness when he wakes up-better after moving the arm around.    All other systems reviewed and negative.   PHYSICAL EXAM: VS:  BP 98/60 mmHg  Pulse 93  Ht 5\' 8"  (1.727 m)  Wt 245 lb 9.6 oz (111.403 kg)  BMI 37.35 kg/m2 Well nourished, well developed, in no acute distress HEENT: normal Neck: no JVD, no carotid bruits Cardiac:  normal S1, S2; RRR;  Lungs:  clear to auscultation bilaterally, bilateral wheezing, no rhonchi or rales Abd: soft, nontender, no hepatomegaly Ext: no edema; tr left radial pulse,  Discolored lower extremity skin. Skin: warm and dry Neuro:   no focal abnormalities noted Psych: Normal affect      ASSESSMENT AND PLAN:  Atrial fibrillation   COumadin for stroke prevention. Paced rhythm noted on last ECG in April 2015.  2. Encounter for long-term (current) use of anticoagulants  INR to be checked and coumadin dose to be adjusted with PharmD.  3. Peripheral artery disease  No arm claudication or angina despite likely subclavian stenosis. Plan for left upper extremity u/s. Circulation in LIMA to LAD could be compromised by subclavian stenosis. He knows also to look for any chest discomfort or anginal equivalent as this may be an indication for subclavian intervention as  well.  Consider referral to Dr. Fletcher Anon.  4. Carotid artery disease: Moderate bilateral internal carotid disease. 2011 u/s reviewed.  Plan for repeat carotid Doppler.   5. CAD: No angina.  CABG in 2000.  No sx like what he had at that time.,  LDL 55 in 7/15. Continue atorvastatin.  Signed, Mina Marble, MD, [facility] 09/24/2014 10:29 AM

## 2014-09-25 DIAGNOSIS — M1711 Unilateral primary osteoarthritis, right knee: Secondary | ICD-10-CM | POA: Diagnosis not present

## 2014-09-26 DIAGNOSIS — L570 Actinic keratosis: Secondary | ICD-10-CM | POA: Diagnosis not present

## 2014-09-26 DIAGNOSIS — L821 Other seborrheic keratosis: Secondary | ICD-10-CM | POA: Diagnosis not present

## 2014-09-27 ENCOUNTER — Ambulatory Visit (HOSPITAL_COMMUNITY): Payer: Medicare Other | Attending: Cardiology | Admitting: *Deleted

## 2014-09-27 ENCOUNTER — Ambulatory Visit (HOSPITAL_BASED_OUTPATIENT_CLINIC_OR_DEPARTMENT_OTHER): Payer: Medicare Other | Admitting: *Deleted

## 2014-09-27 DIAGNOSIS — I493 Ventricular premature depolarization: Secondary | ICD-10-CM | POA: Diagnosis not present

## 2014-09-27 DIAGNOSIS — I739 Peripheral vascular disease, unspecified: Secondary | ICD-10-CM | POA: Diagnosis not present

## 2014-09-27 DIAGNOSIS — I6523 Occlusion and stenosis of bilateral carotid arteries: Secondary | ICD-10-CM

## 2014-09-27 NOTE — Progress Notes (Signed)
Arterial Doppler Upper Performed Arterial Duplex Upper Limited Performed

## 2014-09-27 NOTE — Progress Notes (Signed)
Carotid Duplex Performed 

## 2014-10-01 ENCOUNTER — Other Ambulatory Visit: Payer: Self-pay | Admitting: *Deleted

## 2014-10-01 DIAGNOSIS — I771 Stricture of artery: Secondary | ICD-10-CM

## 2014-10-02 DIAGNOSIS — M1711 Unilateral primary osteoarthritis, right knee: Secondary | ICD-10-CM | POA: Diagnosis not present

## 2014-10-03 ENCOUNTER — Encounter (HOSPITAL_COMMUNITY): Payer: Self-pay | Admitting: Internal Medicine

## 2014-10-09 ENCOUNTER — Other Ambulatory Visit: Payer: Self-pay

## 2014-10-09 DIAGNOSIS — M1711 Unilateral primary osteoarthritis, right knee: Secondary | ICD-10-CM | POA: Diagnosis not present

## 2014-10-09 MED ORDER — AMIODARONE HCL 200 MG PO TABS: 200.0000 mg | ORAL_TABLET | Freq: Every day | ORAL | Status: DC

## 2014-10-14 ENCOUNTER — Encounter: Payer: Medicare Other | Admitting: *Deleted

## 2014-10-14 ENCOUNTER — Telehealth: Payer: Self-pay | Admitting: *Deleted

## 2014-10-14 NOTE — Telephone Encounter (Signed)
ICM transmission received. I left a message for the patient's wife to call.

## 2014-10-15 ENCOUNTER — Ambulatory Visit (INDEPENDENT_AMBULATORY_CARE_PROVIDER_SITE_OTHER): Payer: Medicare Other | Admitting: Pharmacist Clinician (PhC)/ Clinical Pharmacy Specialist

## 2014-10-15 DIAGNOSIS — Z7901 Long term (current) use of anticoagulants: Secondary | ICD-10-CM

## 2014-10-15 DIAGNOSIS — Z5181 Encounter for therapeutic drug level monitoring: Secondary | ICD-10-CM | POA: Diagnosis not present

## 2014-10-15 DIAGNOSIS — I4891 Unspecified atrial fibrillation: Secondary | ICD-10-CM | POA: Diagnosis not present

## 2014-10-15 DIAGNOSIS — I6789 Other cerebrovascular disease: Secondary | ICD-10-CM

## 2014-10-15 LAB — POCT INR: INR: 3.1

## 2014-10-19 ENCOUNTER — Other Ambulatory Visit: Payer: Self-pay | Admitting: Internal Medicine

## 2014-10-21 NOTE — Telephone Encounter (Signed)
VS patient;will forward to his nurse to refill.

## 2014-10-22 ENCOUNTER — Ambulatory Visit (INDEPENDENT_AMBULATORY_CARE_PROVIDER_SITE_OTHER): Payer: Medicare Other | Admitting: Cardiovascular Disease

## 2014-10-22 ENCOUNTER — Encounter: Payer: Self-pay | Admitting: Cardiovascular Disease

## 2014-10-22 ENCOUNTER — Ambulatory Visit: Payer: Medicare Other | Admitting: Cardiovascular Disease

## 2014-10-22 VITALS — BP 110/70 | HR 72 | Ht 68.0 in | Wt 252.7 lb

## 2014-10-22 DIAGNOSIS — I739 Peripheral vascular disease, unspecified: Secondary | ICD-10-CM

## 2014-10-22 DIAGNOSIS — I2510 Atherosclerotic heart disease of native coronary artery without angina pectoris: Secondary | ICD-10-CM

## 2014-10-22 NOTE — Patient Instructions (Signed)
Please follow up with Dr. Berry as needed. 

## 2014-10-22 NOTE — Progress Notes (Signed)
10/22/2014 [name]   December 18, 1933  [id number]  Primary Physician Irven Shelling, MD Primary Cardiologist: Lorretta Harp MD Renae Ramirez   HPI:  [name] is a 80 year old mildly overweight married Caucasian male patient of [name] was referred for evaluation of left subclavian artery stenosis. He has a history of ischemic colopathy. He had coronary artery bypass grafting  X 3 by [name] in 2000 and a Bi-V ICD  placed by [name] 2009. His other problems include hypertension, diabetes, hyperlipidemia, A. Fib on Coumadin anticoagulation and obstructive sleep apnea. He had Dopplers that showed monophasic left upper extremity waveforms and retrograde left vertebral blood flow suggesting a high-grade ostial/proximal left subclavian artery stenosis. The patient specifically denies subclavian steal symptoms or left upper extremity claudication. He also denies chest pain or ischemic symptoms that would suggest LIMA steal.   Current Outpatient Prescriptions  Medication Sig Dispense Refill  . albuterol (PROVENTIL HFA;VENTOLIN HFA) 108 (90 BASE) MCG/ACT inhaler Inhale 2 puffs into the lungs every 6 (six) hours as needed for wheezing or shortness of breath.    [name] albuterol (PROVENTIL) (2.5 MG/3ML) 0.083% nebulizer solution USE 1 VIAL VIA NEBULIZER 4 TIMES A DAY AS DIRECTED 300 mL 1  . amiodarone (PACERONE) 200 MG tablet Take 1 tablet (200 mg total) by mouth daily. 90 tablet 1  . amoxicillin (AMOXIL) 500 MG capsule Take 1 capsule by mouth 4 (four) times daily.    [name] aspirin 81 MG tablet Take 81 mg by mouth daily.      [name] atorvastatin (LIPITOR) 80 MG tablet TAKE 1 TABLET (80 MG TOTAL) BY MOUTH DAILY. (Patient taking differently: take 160 mg by mouth daily) 30 tablet 5  . carvedilol (COREG) 6.25 MG tablet 1 tablet by mouth twice a day 60 tablet 11  . ezetimibe (ZETIA) 10 MG tablet Take 1 tablet (10 mg total) by mouth daily. 90 tablet 1  . furosemide (LASIX) 40 MG  tablet Take 40 mg by mouth 2 (two) times daily.    [name] HYDROcodone-acetaminophen (NORCO/VICODIN) 5-325 MG per tablet Take 1 tablet by mouth as needed for moderate pain.     [name] insulin aspart (NOVOLOG) 100 UNIT/ML injection 25 units before breakfast and lunch, 30 units before supper    . insulin glargine (LANTUS SOLOSTAR) 100 UNIT/ML injection Inject 50 Units into the skin 2 (two) times daily.     [name] levothyroxine (SYNTHROID, LEVOTHROID) 88 MCG tablet Take 88 mcg by mouth daily before breakfast.    . mometasone-formoterol (DULERA) 100-5 MCG/ACT AERO Inhale 2 puffs into the lungs 2 (two) times daily. 1 Inhaler 11  . Multiple Vitamin (MULTIVITAMIN) tablet Take 1 tablet by mouth daily.      [name] omeprazole (PRILOSEC) 20 MG capsule Take 20 mg by mouth daily.      [name] spironolactone (ALDACTONE) 25 MG tablet Take 0.5 tablets (12.5 mg total) by mouth daily. 45 tablet 2  . traMADol (ULTRAM) 50 MG tablet Take 1 tablet by mouth as needed.    . valsartan (DIOVAN) 160 MG tablet Take 1 tablet by mouth daily.    [name] warfarin (COUMADIN) 5 MG tablet TAKE AS DIRECTED BY COUMADIN CLINIC 40 tablet 2   No current facility-administered medications for this visit.    No Known Allergies  History   Social History  . Marital Status: Married    Spouse Name: N/A    Number of Children: N/A  . Years of Education: N/A   Occupational History  .  retired     Engineer, agricultural   Social History Main Topics  . Smoking status: Former Smoker -- 3.00 packs/day for 65 years    Types: Cigarettes    Quit date: 10/25/1998  . Smokeless tobacco: Never Used  . Alcohol Use: No     Comment: remote history of heavy alcohol use  . Drug Use: No  . Sexual Activity: Not on file   Other Topics Concern  . Not on file   Social History Narrative   Lives [place]   Retired     Review of Systems: General: negative for chills, fever, night sweats or weight changes.  Cardiovascular: negative for chest pain, dyspnea on exertion, edema,  orthopnea, palpitations, paroxysmal nocturnal dyspnea or shortness of breath Dermatological: negative for rash Respiratory: negative for cough or wheezing Urologic: negative for hematuria Abdominal: negative for nausea, vomiting, diarrhea, bright red blood per rectum, melena, or hematemesis Neurologic: negative for visual changes, syncope, or dizziness All other systems reviewed and are otherwise negative except as noted above.    Blood pressure 121/70, pulse 72, height 5\' 8"  (1.727 m), weight 252 lb 11.2 oz (114.624 kg).  General appearance: alert and no distress Neck: no adenopathy, no carotid bruit, no JVD, supple, symmetrical, trachea midline and thyroid not enlarged, symmetric, no tenderness/mass/nodules Lungs: clear to auscultation bilaterally Heart: regular rate and rhythm, S1, S2 normal, no murmur, click, rub or gallop Extremities: extremities normal, atraumatic, no cyanosis or edema  EKG not performed today  ASSESSMENT AND PLAN:   Peripheral vascular disease The patient was referred to me by [name] for evaluation of left subclavian artery stenosis.he has a history of remote coronary artery bypass grafting in 2000 by [name] . He has ischemic cardiac myopathy and has had a bi-V ICD placed in 2009 by [name]. He had upper extremity Dopplers that showed monophasic waveforms on the left with probable severe ostial left subclavian artery stenosis. I cannot hear a bruit. The patient however denies left approximate claudication, subclavian steal symptoms or chest pain. Certainly possible that this is jeopardizing his LIMA graft but there are no clinical symptoms of angina and/or ischemia. He does have chronic A. Fib on Coumadin anticoagulation as well. His vertebral flow is retrograde. At this point, with the absence of symptoms or reasons to pursue percutaneous revascularization we will continue to follow him clinically.       Lorretta Harp MD FACP,FACC,FAHA,  [facility] 10/22/2014 10:27 AM

## 2014-10-22 NOTE — Assessment & Plan Note (Signed)
The patient was referred to me by [name] for evaluation of left subclavian artery stenosis.he has a history of remote coronary artery bypass grafting in 2000 by Dr. Roxy Manns . He has ischemic cardiac myopathy and has had a bi-V ICD placed in 2009 by Dr. Ilda Foil. He had upper extremity Dopplers that showed monophasic waveforms on the left with probable severe ostial left subclavian artery stenosis. I cannot hear a bruit. The patient however denies left approximate claudication, subclavian steal symptoms or chest pain. Certainly possible that this is jeopardizing his LIMA graft but there are no clinical symptoms of angina and/or ischemia. He does have chronic A. Fib on Coumadin anticoagulation as well. His vertebral flow is retrograde. At this point, with the absence of symptoms or reasons to pursue percutaneous revascularization we will continue to follow him clinically.

## 2014-10-24 ENCOUNTER — Other Ambulatory Visit: Payer: Self-pay | Admitting: *Deleted

## 2014-10-24 MED ORDER — SPIRONOLACTONE 25 MG PO TABS: 12.5000 mg | ORAL_TABLET | Freq: Every day | ORAL | Status: DC

## 2014-10-28 DIAGNOSIS — E1151 Type 2 diabetes mellitus with diabetic peripheral angiopathy without gangrene: Secondary | ICD-10-CM | POA: Diagnosis not present

## 2014-11-01 ENCOUNTER — Other Ambulatory Visit: Payer: Medicare Other

## 2014-11-07 NOTE — Telephone Encounter (Signed)
No call back from the patient. He is due for his next remote check on 12/10/14. Next ICM follow up will be at that time.

## 2014-11-12 ENCOUNTER — Other Ambulatory Visit (INDEPENDENT_AMBULATORY_CARE_PROVIDER_SITE_OTHER): Payer: Medicare Other | Admitting: *Deleted

## 2014-11-12 ENCOUNTER — Ambulatory Visit (INDEPENDENT_AMBULATORY_CARE_PROVIDER_SITE_OTHER): Payer: Medicare Other | Admitting: Pharmacist

## 2014-11-12 DIAGNOSIS — E785 Hyperlipidemia, unspecified: Secondary | ICD-10-CM | POA: Diagnosis not present

## 2014-11-12 DIAGNOSIS — I6789 Other cerebrovascular disease: Secondary | ICD-10-CM

## 2014-11-12 DIAGNOSIS — Z7901 Long term (current) use of anticoagulants: Secondary | ICD-10-CM | POA: Diagnosis not present

## 2014-11-12 DIAGNOSIS — I4891 Unspecified atrial fibrillation: Secondary | ICD-10-CM

## 2014-11-12 DIAGNOSIS — Z5181 Encounter for therapeutic drug level monitoring: Secondary | ICD-10-CM

## 2014-11-12 LAB — HEPATIC FUNCTION PANEL
ALT: 20 U/L (ref 0–53)
AST: 28 U/L (ref 0–37)
Albumin: 3.7 g/dL (ref 3.5–5.2)
Alkaline Phosphatase: 62 U/L (ref 39–117)
Bilirubin, Direct: 0.1 mg/dL (ref 0.0–0.3)
Total Bilirubin: 0.4 mg/dL (ref 0.2–1.2)
Total Protein: 6.9 g/dL (ref 6.0–8.3)

## 2014-11-12 LAB — POCT INR: INR: 2.7

## 2014-11-14 LAB — NMR LIPOPROFILE WITH LIPIDS
Cholesterol, Total: 137 mg/dL (ref 100–199)
HDL Particle Number: 29.8 umol/L — ABNORMAL LOW (ref >=30.5)
HDL Size: 9.9 nm (ref >=9.2)
HDL-C: 50 mg/dL (ref >39)
LDL (calc): 68 mg/dL (ref 0–99)
LDL Particle Number: 682 nmol/L (ref <1000)
LDL Size: 21 nm (ref >=20.8)
LP-IR Score: 35 (ref <=45)
Large HDL-P: 5.4 umol/L (ref >=4.8)
Large VLDL-P: 1.8 nmol/L (ref <=2.7)
Small LDL Particle Number: 338 nmol/L (ref <=527)
Triglycerides: 95 mg/dL (ref 0–149)
VLDL Size: 45.5 nm (ref <=46.6)

## 2014-12-06 ENCOUNTER — Other Ambulatory Visit: Payer: Self-pay | Admitting: Interventional Cardiology

## 2014-12-10 ENCOUNTER — Telehealth: Payer: Self-pay | Admitting: Cardiology

## 2014-12-10 ENCOUNTER — Telehealth: Payer: Self-pay | Admitting: Internal Medicine

## 2014-12-10 ENCOUNTER — Encounter: Payer: Medicare Other | Admitting: *Deleted

## 2014-12-10 ENCOUNTER — Ambulatory Visit (INDEPENDENT_AMBULATORY_CARE_PROVIDER_SITE_OTHER): Payer: Medicare Other | Admitting: *Deleted

## 2014-12-10 DIAGNOSIS — Z5181 Encounter for therapeutic drug level monitoring: Secondary | ICD-10-CM | POA: Diagnosis not present

## 2014-12-10 DIAGNOSIS — Z7901 Long term (current) use of anticoagulants: Secondary | ICD-10-CM

## 2014-12-10 DIAGNOSIS — I6789 Other cerebrovascular disease: Secondary | ICD-10-CM | POA: Diagnosis not present

## 2014-12-10 DIAGNOSIS — I4891 Unspecified atrial fibrillation: Secondary | ICD-10-CM

## 2014-12-10 LAB — POCT INR: INR: 2

## 2014-12-10 NOTE — Telephone Encounter (Signed)
LMOVM reminding pt to send remote transmission.   

## 2014-12-10 NOTE — Telephone Encounter (Signed)
New message      Tech support will be sending pt a new monitor.  Pt will need help setting it up.  Wife said when he gets the new monitor, he want to bring it to [place] to be set up.  Please call tomorrow

## 2014-12-11 NOTE — Telephone Encounter (Signed)
Informed pt wife to have pt ask for me and I will help pt set up monitor when he receives new monitor.

## 2014-12-14 ENCOUNTER — Other Ambulatory Visit: Payer: Self-pay | Admitting: Cardiology

## 2014-12-16 ENCOUNTER — Other Ambulatory Visit: Payer: Self-pay | Admitting: Cardiology

## 2014-12-17 ENCOUNTER — Other Ambulatory Visit: Payer: Self-pay | Admitting: Cardiology

## 2014-12-24 ENCOUNTER — Ambulatory Visit (INDEPENDENT_AMBULATORY_CARE_PROVIDER_SITE_OTHER): Payer: Medicare Other | Admitting: *Deleted

## 2014-12-24 DIAGNOSIS — Z8673 Personal history of transient ischemic attack (TIA), and cerebral infarction without residual deficits: Secondary | ICD-10-CM | POA: Diagnosis not present

## 2014-12-24 DIAGNOSIS — Z1389 Encounter for screening for other disorder: Secondary | ICD-10-CM | POA: Diagnosis not present

## 2014-12-24 DIAGNOSIS — I6789 Other cerebrovascular disease: Secondary | ICD-10-CM

## 2014-12-24 DIAGNOSIS — E1151 Type 2 diabetes mellitus with diabetic peripheral angiopathy without gangrene: Secondary | ICD-10-CM | POA: Diagnosis not present

## 2014-12-24 DIAGNOSIS — Z0000 Encounter for general adult medical examination without abnormal findings: Secondary | ICD-10-CM | POA: Diagnosis not present

## 2014-12-24 DIAGNOSIS — I129 Hypertensive chronic kidney disease with stage 1 through stage 4 chronic kidney disease, or unspecified chronic kidney disease: Secondary | ICD-10-CM | POA: Diagnosis not present

## 2014-12-24 DIAGNOSIS — G4733 Obstructive sleep apnea (adult) (pediatric): Secondary | ICD-10-CM | POA: Diagnosis not present

## 2014-12-24 DIAGNOSIS — K219 Gastro-esophageal reflux disease without esophagitis: Secondary | ICD-10-CM | POA: Diagnosis not present

## 2014-12-24 DIAGNOSIS — I739 Peripheral vascular disease, unspecified: Secondary | ICD-10-CM | POA: Diagnosis not present

## 2014-12-24 DIAGNOSIS — Z5181 Encounter for therapeutic drug level monitoring: Secondary | ICD-10-CM

## 2014-12-24 DIAGNOSIS — I4891 Unspecified atrial fibrillation: Secondary | ICD-10-CM

## 2014-12-24 DIAGNOSIS — I5022 Chronic systolic (congestive) heart failure: Secondary | ICD-10-CM | POA: Diagnosis not present

## 2014-12-24 DIAGNOSIS — N183 Chronic kidney disease, stage 3 (moderate): Secondary | ICD-10-CM | POA: Diagnosis not present

## 2014-12-24 DIAGNOSIS — Z7901 Long term (current) use of anticoagulants: Secondary | ICD-10-CM | POA: Diagnosis not present

## 2014-12-24 DIAGNOSIS — E1122 Type 2 diabetes mellitus with diabetic chronic kidney disease: Secondary | ICD-10-CM | POA: Diagnosis not present

## 2014-12-24 LAB — POCT INR: INR: 2.1

## 2014-12-30 ENCOUNTER — Telehealth: Payer: Self-pay | Admitting: Interventional Cardiology

## 2014-12-30 NOTE — Telephone Encounter (Signed)
Spoke w/tech services---monitor was never ordered. Per Tech services will rush monitor to pt and should be receiving in next week to two. Wife aware of monitor not being ordered and rushing monitor.

## 2014-12-30 NOTE — Telephone Encounter (Signed)
New message      Pt still has not received his new monitor.  It has been 3wks.  Please check on that and call wife

## 2014-12-31 DIAGNOSIS — M1711 Unilateral primary osteoarthritis, right knee: Secondary | ICD-10-CM | POA: Diagnosis not present

## 2015-01-04 ENCOUNTER — Other Ambulatory Visit: Payer: Self-pay | Admitting: Interventional Cardiology

## 2015-01-07 ENCOUNTER — Ambulatory Visit (INDEPENDENT_AMBULATORY_CARE_PROVIDER_SITE_OTHER): Payer: Medicare Other | Admitting: *Deleted

## 2015-01-07 DIAGNOSIS — I255 Ischemic cardiomyopathy: Secondary | ICD-10-CM

## 2015-01-07 NOTE — Progress Notes (Signed)
Pt seen in clinic to set up transmitter and send transmission. Transmission successfully received. Pt instructed to hook up monitor once home.

## 2015-01-15 ENCOUNTER — Encounter: Payer: Self-pay | Admitting: *Deleted

## 2015-01-17 DIAGNOSIS — E1151 Type 2 diabetes mellitus with diabetic peripheral angiopathy without gangrene: Secondary | ICD-10-CM | POA: Diagnosis not present

## 2015-01-24 ENCOUNTER — Ambulatory Visit (INDEPENDENT_AMBULATORY_CARE_PROVIDER_SITE_OTHER): Payer: Medicare Other | Admitting: *Deleted

## 2015-01-24 DIAGNOSIS — I6789 Other cerebrovascular disease: Secondary | ICD-10-CM | POA: Diagnosis not present

## 2015-01-24 DIAGNOSIS — Z5181 Encounter for therapeutic drug level monitoring: Secondary | ICD-10-CM | POA: Diagnosis not present

## 2015-01-24 DIAGNOSIS — I4891 Unspecified atrial fibrillation: Secondary | ICD-10-CM

## 2015-01-24 DIAGNOSIS — Z7901 Long term (current) use of anticoagulants: Secondary | ICD-10-CM

## 2015-01-24 LAB — POCT INR: INR: 2.6

## 2015-01-28 ENCOUNTER — Other Ambulatory Visit: Payer: Self-pay | Admitting: Pulmonary Disease

## 2015-01-30 ENCOUNTER — Other Ambulatory Visit: Payer: Self-pay | Admitting: Pulmonary Disease

## 2015-02-05 ENCOUNTER — Other Ambulatory Visit: Payer: Self-pay

## 2015-02-05 MED ORDER — ATORVASTATIN CALCIUM 80 MG PO TABS: ORAL_TABLET | ORAL | Status: DC

## 2015-02-08 ENCOUNTER — Other Ambulatory Visit: Payer: Self-pay | Admitting: Interventional Cardiology

## 2015-02-10 ENCOUNTER — Ambulatory Visit (INDEPENDENT_AMBULATORY_CARE_PROVIDER_SITE_OTHER): Payer: Medicare Other | Admitting: *Deleted

## 2015-02-10 DIAGNOSIS — Z95810 Presence of automatic (implantable) cardiac defibrillator: Secondary | ICD-10-CM | POA: Diagnosis not present

## 2015-02-10 DIAGNOSIS — I5022 Chronic systolic (congestive) heart failure: Secondary | ICD-10-CM

## 2015-02-11 ENCOUNTER — Telehealth: Payer: Self-pay | Admitting: *Deleted

## 2015-02-11 ENCOUNTER — Encounter: Payer: Self-pay | Admitting: *Deleted

## 2015-02-11 ENCOUNTER — Ambulatory Visit (INDEPENDENT_AMBULATORY_CARE_PROVIDER_SITE_OTHER): Payer: Medicare Other | Admitting: *Deleted

## 2015-02-11 DIAGNOSIS — I4891 Unspecified atrial fibrillation: Secondary | ICD-10-CM

## 2015-02-11 DIAGNOSIS — Z5181 Encounter for therapeutic drug level monitoring: Secondary | ICD-10-CM | POA: Diagnosis not present

## 2015-02-11 DIAGNOSIS — Z7901 Long term (current) use of anticoagulants: Secondary | ICD-10-CM | POA: Diagnosis not present

## 2015-02-11 DIAGNOSIS — I6789 Other cerebrovascular disease: Secondary | ICD-10-CM

## 2015-02-11 LAB — POCT INR: INR: 2.3

## 2015-02-11 NOTE — Telephone Encounter (Signed)
Follow Up  Wife returned call. She states that she didn't receive an extension to call back directly. Please call back.

## 2015-02-11 NOTE — Telephone Encounter (Signed)
ICM transmission received. I left a message for the patient to call. 

## 2015-02-11 NOTE — Telephone Encounter (Signed)
I spoke with the patient's wife.

## 2015-02-11 NOTE — Progress Notes (Signed)
EPIC Encounter for ICM Monitoring  Patient Name: [name] is a 81 y.o. male Date: 02/11/2015 Primary Care Physican: Irven Shelling, MD Primary Cardiologist: Radford Pax Electrophysiologist: Allred Dry Weight: 248 lbs       In the past month, have you:  1. Gained more than 2 pounds in a day or more than 5 pounds in a week? no  2. Had changes in your medications (with verification of current medications)? Yes. Lantus is now 50 Unit BID  3. Had more shortness of breath than is usual for you? Per the patient's wife, this is progressively getting worse for him. He is due to follow up with Dr. Halford Chessman in pulmonary. I have encouraged her to make this appointment. He is not on home O2- she does not notice any discoloration around his mouth or to his finger tips. He does have a history of COPD.  4. Limited your activity because of shortness of breath? no  5. Not been able to sleep because of shortness of breath? no  6. Had increased swelling in your feet or ankles? No, although he does have occasional swelling to his feet/ ankles. This improves some with elevation over night.   7. Had symptoms of dehydration (dizziness, dry mouth, increased thirst, decreased urine output) no  8. Had changes in sodium restriction? no  9. Been compliant with medication? Yes   ICM trend:   Follow-up plan: ICM clinic phone appointment: 04/17/15. The patient is due to follow up with Dr. Rayann Heman on 03/17/15. No changes made today.   Copy of note sent to patient's primary care physician, primary cardiologist, and device following physician.  Alvis Lemmings, RN, BSN 02/11/2015 1:44 PM

## 2015-03-04 ENCOUNTER — Other Ambulatory Visit: Payer: Self-pay | Admitting: Pulmonary Disease

## 2015-03-04 ENCOUNTER — Other Ambulatory Visit: Payer: Self-pay | Admitting: *Deleted

## 2015-03-04 ENCOUNTER — Other Ambulatory Visit: Payer: Self-pay | Admitting: Cardiology

## 2015-03-04 MED ORDER — ALBUTEROL SULFATE HFA 108 (90 BASE) MCG/ACT IN AERS
2.0000 | INHALATION_SPRAY | Freq: Four times a day (QID) | RESPIRATORY_TRACT | Status: DC | PRN
Start: 2015-03-04 — End: 2019-01-14

## 2015-03-04 NOTE — Telephone Encounter (Signed)
Rescue inhaler refilled x1 with no refills pt must keep June appt.

## 2015-03-07 ENCOUNTER — Other Ambulatory Visit: Payer: Self-pay | Admitting: Cardiology

## 2015-03-11 ENCOUNTER — Other Ambulatory Visit: Payer: Self-pay | Admitting: Cardiology

## 2015-03-14 ENCOUNTER — Other Ambulatory Visit: Payer: Self-pay

## 2015-03-17 ENCOUNTER — Encounter: Payer: Self-pay | Admitting: Internal Medicine

## 2015-03-17 ENCOUNTER — Other Ambulatory Visit: Payer: Self-pay

## 2015-03-17 ENCOUNTER — Ambulatory Visit (INDEPENDENT_AMBULATORY_CARE_PROVIDER_SITE_OTHER): Payer: Medicare Other | Admitting: Internal Medicine

## 2015-03-17 ENCOUNTER — Ambulatory Visit (INDEPENDENT_AMBULATORY_CARE_PROVIDER_SITE_OTHER): Payer: Medicare Other | Admitting: *Deleted

## 2015-03-17 VITALS — BP 154/82 | HR 86 | Ht 68.0 in | Wt 257.6 lb

## 2015-03-17 DIAGNOSIS — I4891 Unspecified atrial fibrillation: Secondary | ICD-10-CM | POA: Diagnosis not present

## 2015-03-17 DIAGNOSIS — I5022 Chronic systolic (congestive) heart failure: Secondary | ICD-10-CM

## 2015-03-17 DIAGNOSIS — I10 Essential (primary) hypertension: Secondary | ICD-10-CM

## 2015-03-17 DIAGNOSIS — I6789 Other cerebrovascular disease: Secondary | ICD-10-CM

## 2015-03-17 DIAGNOSIS — I255 Ischemic cardiomyopathy: Secondary | ICD-10-CM | POA: Diagnosis not present

## 2015-03-17 DIAGNOSIS — Z7901 Long term (current) use of anticoagulants: Secondary | ICD-10-CM

## 2015-03-17 DIAGNOSIS — Z5181 Encounter for therapeutic drug level monitoring: Secondary | ICD-10-CM

## 2015-03-17 DIAGNOSIS — I420 Dilated cardiomyopathy: Secondary | ICD-10-CM

## 2015-03-17 LAB — CUP PACEART INCLINIC DEVICE CHECK
Battery Remaining Longevity: 72 mo
Battery Voltage: 2.93 V
Brady Statistic AP VP Percent: 97.17 %
Brady Statistic AP VS Percent: 1.14 %
Brady Statistic AS VP Percent: 1.67 %
Brady Statistic AS VS Percent: 0.02 %
Brady Statistic RA Percent Paced: 98.31 %
Brady Statistic RV Percent Paced: 97.66 %
Date Time Interrogation Session: 20160523153215
HighPow Impedance: 190 Ohm
HighPow Impedance: 50 Ohm
HighPow Impedance: 60 Ohm
Lead Channel Impedance Value: 456 Ohm
Lead Channel Impedance Value: 589 Ohm
Lead Channel Impedance Value: 589 Ohm
Lead Channel Impedance Value: 608 Ohm
Lead Channel Impedance Value: 988 Ohm
Lead Channel Pacing Threshold Amplitude: 0.75 V
Lead Channel Pacing Threshold Amplitude: 0.75 V
Lead Channel Pacing Threshold Amplitude: 1 V
Lead Channel Pacing Threshold Pulse Width: 0.4 ms
Lead Channel Pacing Threshold Pulse Width: 0.4 ms
Lead Channel Pacing Threshold Pulse Width: 0.6 ms
Lead Channel Sensing Intrinsic Amplitude: 0.875 mV
Lead Channel Sensing Intrinsic Amplitude: 10.125 mV
Lead Channel Setting Pacing Amplitude: 1.5 V
Lead Channel Setting Pacing Amplitude: 1.5 V
Lead Channel Setting Pacing Amplitude: 2.75 V
Lead Channel Setting Pacing Pulse Width: 0.4 ms
Lead Channel Setting Pacing Pulse Width: 0.6 ms
Lead Channel Setting Sensing Sensitivity: 0.3 mV
Zone Setting Detection Interval: 300 ms
Zone Setting Detection Interval: 330 ms
Zone Setting Detection Interval: 330 ms
Zone Setting Detection Interval: 400 ms

## 2015-03-17 LAB — POCT INR: INR: 1.9

## 2015-03-17 NOTE — Patient Instructions (Addendum)
Medication Instructions:  Your physician recommends that you continue on your current medications as directed. Please refer to the Current Medication list given to you today.   Labwork: None ordered  Testing/Procedures: None ordered  Follow-Up: Your physician wants you to follow-up in: 12 months with [name] Marshall, [name] You will receive a reminder letter in the mail two months in advance. If you don't receive a letter, please call our office to schedule the follow-up appointment.  Remote monitoring is used to monitor your Pacemaker or ICD from home. This monitoring reduces the number of office visits required to check your device to one time per year. It allows [place] to keep an eye on the functioning of your device to ensure it is working properly. You are scheduled for a device check from home on 04/17/15. You may send your transmission at any time that day. If you have a wireless device, the transmission will be sent automatically. After your physician reviews your transmission, you will receive a postcard with your next transmission date.    Any Other Special Instructions Will Be Listed Below (If Applicable).

## 2015-03-17 NOTE — Progress Notes (Signed)
Electrophysiology Office Note   Date:  03/17/2015   ID:  [name], DOB Sep 09, 1934, MRN [id number]  PCP:  Irven Shelling, MD  Cardiologist:  Dr Radford Pax Primary Electrophysiologist: Thompson Grayer, MD    Chief Complaint  Patient presents with  . Atrial Fibrillation  . Chronic Systolic Heart Failure     History of Present Illness: [name] is a 81 y.o. male who presents today for electrophysiology evaluation.   Doing well.  SOB is chronic.  He walks slowly with a cane.  Edema is stable.  Today, he denies symptoms of palpitations, chest pain, claudication, dizziness, presyncope, syncope, bleeding, or neurologic sequela. The patient is tolerating medications without difficulties and is otherwise without complaint today.    Past Medical History  Diagnosis Date  . Dyspnea   . OSA (obstructive sleep apnea)   . HTN (hypertension)   . DM (diabetes mellitus), type 2   . Hyperlipidemia   . Systolic congestive heart failure 2009    s/p BiV ICD implantation by Dr Leonia Reeves (MDT)  . Atrial fibrillation     persistent, previously seen at [facility] and placed on amiodarone  . Hypothyroidism   . PNA (pneumonia)   . Cleft palate   . Nasal septal deviation   . Iron deficiency anemia   . GERD (gastroesophageal reflux disease)   . Benign prostatic hypertrophy   . Nephrolithiasis   . Psoriasis   . Seborrheic keratosis   . COPD with emphysema 04/01/2010  . CAD (coronary artery disease)     multivessel s/p inferolateral wall MI with subsequent CABG 11/1998.  Cath 2009 with Patent grafts  . Ischemic dilated cardiomyopathy     EF 35-40% by MUGA 6/11  . PAF (paroxysmal atrial fibrillation)   . Peripheral arterial disease     left subclavian artery stenosis   Past Surgical History  Procedure Laterality Date  . Carpal tunnel release    . Left cleft palate and left cleft lip repair    . Cataract extraction    . C-spine surgery    . Bi-ventricular implantable cardioverter  defibrillator  (crt-d)  10-08-08; 11-06-2013    Dr Leonia Reeves ([facility]T) implant for primary prevention; gen change to MDT VivaXT CRTD by Dr Rayann Heman  . Coronary artery bypass graft      LIMA to LAD, SVG to OM, SVG to diagonal  . Biv icd genertaor change out N/A 11/06/2013    Procedure: BIV ICD [place];  Surgeon: Coralyn Mark, MD;  Location: [facility] CATH LAB;  Service: Cardiovascular;  Laterality: N/A;     Current Outpatient Prescriptions  Medication Sig Dispense Refill  . albuterol (PROVENTIL HFA;VENTOLIN HFA) 108 (90 BASE) MCG/ACT inhaler Inhale 2 puffs into the lungs every 6 (six) hours as needed for wheezing or shortness of breath. Must keep June 2016 appt. 1 Inhaler 0  . albuterol (PROVENTIL) (2.5 MG/3ML) 0.083% nebulizer solution USE 1 VIAL VIA NEBULIZER 4 TIMES A DAY AS DIRECTED 300 mL 0  . amiodarone (PACERONE) 200 MG tablet TAKE 1 TABLET (200 MG TOTAL) BY MOUTH DAILY. 30 tablet 3  . aspirin 81 MG tablet Take 81 mg by mouth daily.      [name] atorvastatin (LIPITOR) 80 MG tablet TAKE 1 TABLET (80 MG TOTAL) BY MOUTH DAILY. 30 tablet 5  . carvedilol (COREG) 6.25 MG tablet 1 TABLET BY MOUTH TWICE A DAY 60 tablet 10  . DULERA 100-5 MCG/ACT AERO INHALE 2 PUFFS INTO THE LUNGS 2 (TWO) TIMES DAILY.  1 Inhaler 0  . furosemide (LASIX) 40 MG tablet Take 40 mg by mouth 2 (two) times daily.    [name] HYDROcodone-acetaminophen (NORCO/VICODIN) 5-325 MG per tablet Take 1 tablet by mouth daily as needed for moderate pain.     [name] insulin aspart (NOVOLOG) 100 UNIT/ML injection Inject 25 units into the skin before breakfast and lunch, inject 30 units into the skin before supper    . insulin glargine (LANTUS SOLOSTAR) 100 UNIT/ML injection Inject 50 Units into the skin 2 (two) times daily.     [name] levothyroxine (SYNTHROID, LEVOTHROID) 88 MCG tablet Take 88 mcg by mouth daily before breakfast.    . Multiple Vitamin (MULTIVITAMIN) tablet Take 1 tablet by mouth daily.      [name] omeprazole (PRILOSEC) 20 MG capsule Take 20 mg by  mouth daily.      [name] spironolactone (ALDACTONE) 25 MG tablet TAKE 0.5 TABLETS (12.5 MG TOTAL) BY MOUTH DAILY. 45 tablet 3  . traMADol (ULTRAM) 50 MG tablet Take 1 tablet by mouth daily as needed (pain).     . valsartan (DIOVAN) 160 MG tablet Take 1 tablet by mouth daily.    [name] warfarin (COUMADIN) 5 MG tablet TAKE AS DIRECTED BY COUMADIN CLINIC 40 tablet 3  . ZETIA 10 MG tablet TAKE 1 TABLET BY MOUTH EVERY DAY 90 tablet 1  . amoxicillin (AMOXIL) 500 MG capsule Take 1 capsule by mouth 4 (four) times daily.     No current facility-administered medications for this visit.    Allergies:   Review of patient's allergies indicates no known allergies.   Social History:  The patient  reports that he quit smoking about 16 years ago. His smoking use included Cigarettes. He has a 195 pack-year smoking history. He has never used smokeless tobacco. He reports that he does not drink alcohol or use illicit drugs.   Family History:  The patient's family history includes Asthma in his father; Heart disease in his brother; Stroke in his father.    ROS:  Please see the history of present illness.   All other systems are reviewed and negative.    PHYSICAL EXAM: VS:  BP 154/82 mmHg  Pulse 86  Ht 5\' 8"  (1.727 m)  Wt 116.847 kg (257 lb 9.6 oz)  BMI 39.18 kg/m2 , BMI Body mass index is 39.18 kg/(m^2). GEN: chronically ill,  in no acute distress HEENT: normal Neck: no JVD, carotid bruits, or masses Cardiac: RRR; no murmurs, rubs, or gallops,+ edema  Respiratory:  clear to auscultation bilaterally, normal work of breathing GI: soft, nontender, nondistended, + BS MS: no deformity or atrophy Skin: warm and dry, device pocket is well healed Neuro:  Strength and sensation are intact Psych: euthymic mood, full affect  EKG:  EKG is ordered today. The ekg ordered today shows AV paced  Device interrogation is reviewed today in detail.  See PaceArt for details.   Recent Labs: 11/12/2014: ALT 20    Lipid  Panel     Component Value Date/Time   CHOL 137 11/12/2014 0921   CHOL  11/29/2008 0330    88        ATP III CLASSIFICATION:  <200     mg/dL   Desirable  200-239  mg/dL   Borderline High  >=240    mg/dL   High          TRIG 95 11/12/2014 0921   TRIG 52 11/29/2008 0330   HDL 50 11/12/2014 0921   HDL 34* 11/29/2008 0330  CHOLHDL 2.6 11/29/2008 0330   VLDL 10 11/29/2008 0330   LDLCALC 68 11/12/2014 0921   LDLCALC  11/29/2008 0330    44        Total Cholesterol/HDL:CHD Risk Coronary Heart Disease Risk Table                     Men   Women  1/2 Average Risk   3.4   3.3  Average Risk       5.0   4.4  2 X Average Risk   9.6   7.1  3 X Average Risk  23.4   11.0        Use the calculated Patient Ratio above and the CHD Risk Table to determine the patient's CHD Risk.        ATP III CLASSIFICATION (LDL):  <100     mg/dL   Optimal  100-129  mg/dL   Near or Above                    Optimal  130-159  mg/dL   Borderline  160-189  mg/dL   High  >190     mg/dL   Very High     Wt Readings from Last 3 Encounters:  03/17/15 116.847 kg (257 lb 9.6 oz)  10/22/14 114.624 kg (252 lb 11.2 oz)  09/24/14 111.403 kg (245 lb 9.6 oz)     ASSESSMENT AND PLAN:  1.  Chronic systollic dysfunction Normal BiV ICD function See Pace Art report No changes today Followed in ICM device clinic  2. HTn Stable No change required today He reports BP is better controlled at home  3. AF Maintaining sinus with amiodarone Consider reducing amiodarone due to SOB.  Will defer to primary cardiologist Continue coumadin  Current medicines are reviewed at length with the patient today.   The patient does not have concerns regarding his medicines.  The following changes were made today:  none  Follow-up: merlin, return to see EP NP in 1year  Signed, Thompson Grayer, MD  03/17/2015 12:07 PM     [address] [place] [address] [phone]  (office) [phone] (fax)

## 2015-03-18 ENCOUNTER — Other Ambulatory Visit: Payer: Self-pay | Admitting: Cardiology

## 2015-03-27 ENCOUNTER — Ambulatory Visit (INDEPENDENT_AMBULATORY_CARE_PROVIDER_SITE_OTHER): Payer: Medicare Other | Admitting: Pulmonary Disease

## 2015-03-27 ENCOUNTER — Encounter: Payer: Self-pay | Admitting: Pulmonary Disease

## 2015-03-27 VITALS — BP 128/68 | HR 88 | Ht 68.0 in | Wt 255.2 lb

## 2015-03-27 DIAGNOSIS — L570 Actinic keratosis: Secondary | ICD-10-CM | POA: Diagnosis not present

## 2015-03-27 DIAGNOSIS — G4733 Obstructive sleep apnea (adult) (pediatric): Secondary | ICD-10-CM

## 2015-03-27 DIAGNOSIS — J439 Emphysema, unspecified: Secondary | ICD-10-CM

## 2015-03-27 DIAGNOSIS — I255 Ischemic cardiomyopathy: Secondary | ICD-10-CM | POA: Diagnosis not present

## 2015-03-27 DIAGNOSIS — Z9989 Dependence on other enabling machines and devices: Principal | ICD-10-CM

## 2015-03-27 NOTE — Progress Notes (Signed)
Chief Complaint  Patient presents with  . Follow-up    Pt denies any increased SOB since last seen.     History of Present Illness: [name] is a 81 y.o. male former smoker with COPD/Emphysema, and OSA.  He gets short of breath intermittently when sitting.  This lasts for about a minute.  He tried using albuterol >> no help.  Symptoms go away on own.    He also has trouble in hot, dry weather.  He is not having any trouble with his CPAP.  He gets cough with clear sputum.  Sometimes hard to bring up sputum.   TESTS: PSG 06/14/06>>AHI 35.5  PFT 03/30/11>>FEV1 1.98(72%), FEV1% 68, DLCO 64%  PFT 04/19/12>>FEV1 1.65 (67%), FEV1% 63, TLC 5.66 (99%), DLCO 71%, no BD. Echo 10/03/13 >> EF 40 to 45%, mod LVH, grade 1 diastolic dysfx  Auto CPAP 09/08/13 to 12/06/13 >> Used on 83 of 90 nights with average 6 hrs 53 min.  Average AHI 17.8 with median CPAP 9 cm H2O and 95 th percentile CPAP 12 cm H2O.  CI 9, OI 1.9, HI 6.5. PFT 12/19/13 >> FEV1 2.03 (77%), FEV1% 72, TLC 6.45 (98%), DLCO 49% CT chest 01/18/14 >> mild centrilobular and paraseptal emphysema, mild fibrotic changes Rt periphery  PMHx >> CAD, systolic CHF, PAD, HTN, DM, HLD, A fib, Hypothyroidism, BPH, Psoriasis, GERD  PSHx, Medications, Allergies, Fhx, Shx reviewed.  Physical Exam: Blood pressure 128/68, pulse 88, height 5\' 8"  (1.727 m), weight 255 lb 3.2 oz (115.758 kg), SpO2 94 %. Body mass index is 38.81 kg/(m^2).  General - No distress ENT - No sinus tenderness, no oral exudate, changes of cleft lip surgery, nasal septal deviation, no LAN Cardiac - s1s2 regular, no murmur Chest - Decreased breath sounds, no wheeze/rales/dullness Back - No focal tenderness Abd - Soft, non-tender Ext - No edema Neuro - Normal strength Skin - No rashes Psych - normal mood, and behavior   Assessment/Plan:  COPD with emphysema. Plan: - continue dulera and prn albuterol - discussed proper indications for his inhalers - advised  he could try using mucinex to help loosen phlegm  Obstructive sleep apnea. He is compliant with therapy and reports benefit from CPAP. Plan: - continue auto CPAP - will get his download and call him with results   Chesley Mires, MD [place] [facility] Pager:  [phone] 03/27/2015, 2:07 PM

## 2015-03-27 NOTE — Patient Instructions (Signed)
Follow up in 1 year.

## 2015-03-30 ENCOUNTER — Other Ambulatory Visit: Payer: Self-pay | Admitting: Pulmonary Disease

## 2015-04-01 DIAGNOSIS — Z961 Presence of intraocular lens: Secondary | ICD-10-CM | POA: Diagnosis not present

## 2015-04-01 DIAGNOSIS — E119 Type 2 diabetes mellitus without complications: Secondary | ICD-10-CM | POA: Diagnosis not present

## 2015-04-01 DIAGNOSIS — H26491 Other secondary cataract, right eye: Secondary | ICD-10-CM | POA: Diagnosis not present

## 2015-04-01 DIAGNOSIS — H3531 Nonexudative age-related macular degeneration: Secondary | ICD-10-CM | POA: Diagnosis not present

## 2015-04-10 DIAGNOSIS — E1151 Type 2 diabetes mellitus with diabetic peripheral angiopathy without gangrene: Secondary | ICD-10-CM | POA: Diagnosis not present

## 2015-04-17 ENCOUNTER — Ambulatory Visit (INDEPENDENT_AMBULATORY_CARE_PROVIDER_SITE_OTHER): Payer: Medicare Other | Admitting: *Deleted

## 2015-04-17 ENCOUNTER — Encounter: Payer: Self-pay | Admitting: *Deleted

## 2015-04-17 ENCOUNTER — Other Ambulatory Visit: Payer: Self-pay | Admitting: *Deleted

## 2015-04-17 DIAGNOSIS — Z95810 Presence of automatic (implantable) cardiac defibrillator: Secondary | ICD-10-CM | POA: Diagnosis not present

## 2015-04-17 DIAGNOSIS — I5022 Chronic systolic (congestive) heart failure: Secondary | ICD-10-CM | POA: Diagnosis not present

## 2015-04-17 MED ORDER — ATORVASTATIN CALCIUM 80 MG PO TABS: ORAL_TABLET | ORAL | Status: DC

## 2015-04-17 NOTE — Progress Notes (Signed)
EPIC Encounter for ICM Monitoring  Patient Name: [name] is a 81 y.o. male Date: 04/17/2015 Primary Care Physican: Irven Shelling, MD Primary Cardiologist: Radford Pax Electrophysiologist: Allred Dry Weight: 248 lbs  Bi-V pacing: 98.4%       In the past month, have you:  1. Gained more than 2 pounds in a day or more than 5 pounds in a week? no  2. Had changes in your medications (with verification of current medications)? no  3. Had more shortness of breath than is usual for you? The patient is typically SOB and this is unchanged from his normal. He saw Dr. Halford Chessman recently and they are waiting on him to evaluate his CPAP download.   4. Limited your activity because of shortness of breath? no  5. Not been able to sleep because of shortness of breath? no  6. Had increased swelling in your feet or ankles? Minimal swelling to his feet and ankles.  7. Had symptoms of dehydration (dizziness, dry mouth, increased thirst, decreased urine output) no  8. Had changes in sodium restriction? no  9. Been compliant with medication? Yes   ICM trend:   Follow-up plan: ICM clinic phone appointment: 05/26/15. No changes today.  Copy of note sent to patient's primary care physician, primary cardiologist, and device following physician.  Alvis Lemmings, RN, BSN 04/17/2015 10:32 AM

## 2015-04-28 ENCOUNTER — Other Ambulatory Visit: Payer: Self-pay | Admitting: Interventional Cardiology

## 2015-04-29 ENCOUNTER — Ambulatory Visit (INDEPENDENT_AMBULATORY_CARE_PROVIDER_SITE_OTHER): Payer: Medicare Other | Admitting: *Deleted

## 2015-04-29 DIAGNOSIS — Z7901 Long term (current) use of anticoagulants: Secondary | ICD-10-CM | POA: Diagnosis not present

## 2015-04-29 DIAGNOSIS — Z5181 Encounter for therapeutic drug level monitoring: Secondary | ICD-10-CM

## 2015-04-29 DIAGNOSIS — I6789 Other cerebrovascular disease: Secondary | ICD-10-CM | POA: Diagnosis not present

## 2015-04-29 DIAGNOSIS — I4891 Unspecified atrial fibrillation: Secondary | ICD-10-CM | POA: Diagnosis not present

## 2015-04-29 LAB — POCT INR: INR: 2.1

## 2015-04-30 ENCOUNTER — Other Ambulatory Visit: Payer: Self-pay | Admitting: Pulmonary Disease

## 2015-05-19 DIAGNOSIS — N183 Chronic kidney disease, stage 3 (moderate): Secondary | ICD-10-CM | POA: Diagnosis not present

## 2015-05-19 DIAGNOSIS — E08329 Diabetes mellitus due to underlying condition with mild nonproliferative diabetic retinopathy without macular edema: Secondary | ICD-10-CM | POA: Diagnosis not present

## 2015-05-19 DIAGNOSIS — Z794 Long term (current) use of insulin: Secondary | ICD-10-CM | POA: Diagnosis not present

## 2015-05-19 DIAGNOSIS — I129 Hypertensive chronic kidney disease with stage 1 through stage 4 chronic kidney disease, or unspecified chronic kidney disease: Secondary | ICD-10-CM | POA: Diagnosis not present

## 2015-05-19 DIAGNOSIS — G4733 Obstructive sleep apnea (adult) (pediatric): Secondary | ICD-10-CM | POA: Diagnosis not present

## 2015-05-19 DIAGNOSIS — E1122 Type 2 diabetes mellitus with diabetic chronic kidney disease: Secondary | ICD-10-CM | POA: Diagnosis not present

## 2015-05-26 ENCOUNTER — Other Ambulatory Visit: Payer: Self-pay | Admitting: Pulmonary Disease

## 2015-05-26 ENCOUNTER — Ambulatory Visit (INDEPENDENT_AMBULATORY_CARE_PROVIDER_SITE_OTHER): Payer: Medicare Other | Admitting: *Deleted

## 2015-05-26 ENCOUNTER — Other Ambulatory Visit: Payer: Self-pay | Admitting: Cardiology

## 2015-05-26 DIAGNOSIS — Z95810 Presence of automatic (implantable) cardiac defibrillator: Secondary | ICD-10-CM | POA: Diagnosis not present

## 2015-05-26 DIAGNOSIS — I5022 Chronic systolic (congestive) heart failure: Secondary | ICD-10-CM | POA: Diagnosis not present

## 2015-05-29 ENCOUNTER — Telehealth: Payer: Self-pay | Admitting: *Deleted

## 2015-05-29 NOTE — Telephone Encounter (Signed)
ICM transmission received. I left a message for the patient to call. 

## 2015-05-30 ENCOUNTER — Encounter: Payer: Self-pay | Admitting: *Deleted

## 2015-05-30 NOTE — Progress Notes (Signed)
..  EPIC Encounter for ICM Monitoring  Patient Name: [name] is a 81 y.o. male Date: 05/30/2015 Primary Care Physican: Irven Shelling, MD Primary Cardiologist: Radford Pax Electrophysiologist: Allred Dry Weight: 250 lbs       In the past month, have you:  1. Gained more than 2 pounds in a day or more than 5 pounds in a week? no  2. Had changes in your medications (with verification of current medications)? no  3. Had more shortness of breath than is usual for you? no  4. Limited your activity because of shortness of breath? no  5. Not been able to sleep because of shortness of breath? no  6. Had increased swelling in your feet or ankles? no  7. Had symptoms of dehydration (dizziness, dry mouth, increased thirst, decreased urine output) no  8. Had changes in sodium restriction? no  9. Been compliant with medication? Yes   ICM trend:   Follow-up plan: ICM clinic phone appointment 07/02/2015.  No changes made today.  Copy of note sent to patient's primary care physician, primary cardiologist, and device following physician.  Alvis Lemmings, RN, BSN 05/30/2015 4:04 PM

## 2015-05-30 NOTE — Telephone Encounter (Signed)
I spoke with the patient's wife.

## 2015-05-30 NOTE — Telephone Encounter (Signed)
The patient's wife called back to triage today on the phone I was at yesterday. I attempted to call her back - left a message for her to call me at my extension today.

## 2015-06-10 ENCOUNTER — Ambulatory Visit (INDEPENDENT_AMBULATORY_CARE_PROVIDER_SITE_OTHER): Payer: Medicare Other | Admitting: *Deleted

## 2015-06-10 DIAGNOSIS — Z5181 Encounter for therapeutic drug level monitoring: Secondary | ICD-10-CM

## 2015-06-10 DIAGNOSIS — Z7901 Long term (current) use of anticoagulants: Secondary | ICD-10-CM

## 2015-06-10 DIAGNOSIS — I6789 Other cerebrovascular disease: Secondary | ICD-10-CM

## 2015-06-10 DIAGNOSIS — I4891 Unspecified atrial fibrillation: Secondary | ICD-10-CM

## 2015-06-10 LAB — POCT INR: INR: 2.1

## 2015-06-14 ENCOUNTER — Other Ambulatory Visit: Payer: Self-pay | Admitting: Pulmonary Disease

## 2015-06-18 ENCOUNTER — Encounter: Payer: Self-pay | Admitting: Interventional Cardiology

## 2015-06-23 ENCOUNTER — Other Ambulatory Visit: Payer: Self-pay | Admitting: Interventional Cardiology

## 2015-06-26 DIAGNOSIS — M79673 Pain in unspecified foot: Secondary | ICD-10-CM | POA: Diagnosis not present

## 2015-06-26 DIAGNOSIS — B351 Tinea unguium: Secondary | ICD-10-CM | POA: Diagnosis not present

## 2015-06-26 DIAGNOSIS — E1151 Type 2 diabetes mellitus with diabetic peripheral angiopathy without gangrene: Secondary | ICD-10-CM | POA: Diagnosis not present

## 2015-07-02 ENCOUNTER — Telehealth: Payer: Self-pay

## 2015-07-02 ENCOUNTER — Ambulatory Visit (INDEPENDENT_AMBULATORY_CARE_PROVIDER_SITE_OTHER): Payer: Medicare Other | Admitting: *Deleted

## 2015-07-02 DIAGNOSIS — I255 Ischemic cardiomyopathy: Secondary | ICD-10-CM

## 2015-07-02 NOTE — Telephone Encounter (Signed)
ICM transmission received. Attempted call to home and mobile number and left messages for return call.

## 2015-07-03 NOTE — Progress Notes (Signed)
Remote ICD transmission.   

## 2015-07-07 NOTE — Telephone Encounter (Signed)
Unable to reach patient for ICM follow up.  Will follow up next month.

## 2015-07-18 LAB — CUP PACEART REMOTE DEVICE CHECK
Battery Remaining Longevity: 67 mo
Battery Voltage: 2.98 V
Brady Statistic AP VP Percent: 96.7 %
Brady Statistic AP VS Percent: 1.15 %
Brady Statistic AS VP Percent: 2.12 %
Brady Statistic AS VS Percent: 0.03 %
Brady Statistic RA Percent Paced: 97.85 %
Brady Statistic RV Percent Paced: 98.44 %
Date Time Interrogation Session: 20160907191031
HighPow Impedance: 47 Ohm
HighPow Impedance: 61 Ohm
Lead Channel Impedance Value: 456 Ohm
Lead Channel Impedance Value: 551 Ohm
Lead Channel Impedance Value: 589 Ohm
Lead Channel Impedance Value: 608 Ohm
Lead Channel Impedance Value: 646 Ohm
Lead Channel Impedance Value: 988 Ohm
Lead Channel Pacing Threshold Amplitude: 0.75 V
Lead Channel Pacing Threshold Amplitude: 0.75 V
Lead Channel Pacing Threshold Amplitude: 1.375 V
Lead Channel Pacing Threshold Pulse Width: 0.4 ms
Lead Channel Pacing Threshold Pulse Width: 0.4 ms
Lead Channel Pacing Threshold Pulse Width: 0.6 ms
Lead Channel Sensing Intrinsic Amplitude: 1.125 mV
Lead Channel Sensing Intrinsic Amplitude: 1.125 mV
Lead Channel Sensing Intrinsic Amplitude: 9.625 mV
Lead Channel Sensing Intrinsic Amplitude: 9.625 mV
Lead Channel Setting Pacing Amplitude: 1.5 V
Lead Channel Setting Pacing Amplitude: 1.75 V
Lead Channel Setting Pacing Amplitude: 3 V
Lead Channel Setting Pacing Pulse Width: 0.4 ms
Lead Channel Setting Pacing Pulse Width: 0.6 ms
Lead Channel Setting Sensing Sensitivity: 0.3 mV
Zone Setting Detection Interval: 300 ms
Zone Setting Detection Interval: 330 ms
Zone Setting Detection Interval: 330 ms
Zone Setting Detection Interval: 400 ms

## 2015-07-22 ENCOUNTER — Ambulatory Visit (INDEPENDENT_AMBULATORY_CARE_PROVIDER_SITE_OTHER): Payer: Medicare Other | Admitting: *Deleted

## 2015-07-22 DIAGNOSIS — Z7901 Long term (current) use of anticoagulants: Secondary | ICD-10-CM

## 2015-07-22 DIAGNOSIS — Z5181 Encounter for therapeutic drug level monitoring: Secondary | ICD-10-CM | POA: Diagnosis not present

## 2015-07-22 DIAGNOSIS — I4891 Unspecified atrial fibrillation: Secondary | ICD-10-CM

## 2015-07-22 DIAGNOSIS — I6789 Other cerebrovascular disease: Secondary | ICD-10-CM | POA: Diagnosis not present

## 2015-07-22 LAB — POCT INR: INR: 2.1

## 2015-07-24 ENCOUNTER — Other Ambulatory Visit: Payer: Self-pay | Admitting: Cardiology

## 2015-07-25 ENCOUNTER — Encounter: Payer: Self-pay | Admitting: Cardiology

## 2015-07-29 ENCOUNTER — Ambulatory Visit (INDEPENDENT_AMBULATORY_CARE_PROVIDER_SITE_OTHER): Payer: Medicare Other

## 2015-07-29 DIAGNOSIS — I5022 Chronic systolic (congestive) heart failure: Secondary | ICD-10-CM

## 2015-07-29 DIAGNOSIS — Z95810 Presence of automatic (implantable) cardiac defibrillator: Secondary | ICD-10-CM

## 2015-07-29 NOTE — Progress Notes (Signed)
EPIC Encounter for ICM Monitoring  Patient Name: [name] is a 81 y.o. male Date: 07/29/2015 Primary Care Physican: Irven Shelling, MD Primary Cardiologist: Radford Pax Electrophysiologist: Allred Dry Weight: 248 lbs       In the past month, have you:  1. Gained more than 2 pounds in a day or more than 5 pounds in a week? no  2. Had changes in your medications (with verification of current medications)? no  3. Had more shortness of breath than is usual for you? no  4. Limited your activity because of shortness of breath? no  5. Not been able to sleep because of shortness of breath? no  6. Had increased swelling in your feet or ankles? no  7. Had symptoms of dehydration (dizziness, dry mouth, increased thirst, decreased urine output) no  8. Had changes in sodium restriction? no  9. Been compliant with medication? Yes   ICM trend:  07/29/2015   Follow-up plan: ICM clinic phone appointment on 08/27/2015.  Optivol trending at baseline at time of transmission. Spoke with wife, [name].  She reported patient has had some fatigue ~07/01/2015 to 07/19/2015 and weight was up a couple of pounds during that time.  She stated he has been feeling better in the last week.  He weighs weekly and recommended he weigh daily to monitor for fluid weight gain.  No changes today.   Copy of note sent to patient's primary care physician, primary cardiologist, and device following physician.  Rosalene Billings, RN, CCM 07/29/2015 3:04 PM

## 2015-08-01 ENCOUNTER — Telehealth: Payer: Self-pay | Admitting: Pulmonary Disease

## 2015-08-01 NOTE — Telephone Encounter (Signed)
Patient's wife calling to see if our office tried to contact patient.  She says that patient is hard of hearing and told her to call our office. I do not see in the chart that we have attempted to contact him. Patient's wife notified. Nothing further needed.

## 2015-08-06 ENCOUNTER — Encounter: Payer: Self-pay | Admitting: Internal Medicine

## 2015-08-06 DIAGNOSIS — Z23 Encounter for immunization: Secondary | ICD-10-CM | POA: Diagnosis not present

## 2015-08-18 DIAGNOSIS — E1151 Type 2 diabetes mellitus with diabetic peripheral angiopathy without gangrene: Secondary | ICD-10-CM | POA: Diagnosis not present

## 2015-08-18 DIAGNOSIS — M1711 Unilateral primary osteoarthritis, right knee: Secondary | ICD-10-CM | POA: Diagnosis not present

## 2015-08-18 DIAGNOSIS — M25561 Pain in right knee: Secondary | ICD-10-CM | POA: Diagnosis not present

## 2015-08-19 ENCOUNTER — Other Ambulatory Visit: Payer: Self-pay | Admitting: Pulmonary Disease

## 2015-08-22 ENCOUNTER — Other Ambulatory Visit: Payer: Self-pay | Admitting: *Deleted

## 2015-08-22 DIAGNOSIS — E1159 Type 2 diabetes mellitus with other circulatory complications: Secondary | ICD-10-CM

## 2015-08-25 DIAGNOSIS — L820 Inflamed seborrheic keratosis: Secondary | ICD-10-CM | POA: Diagnosis not present

## 2015-08-25 DIAGNOSIS — L570 Actinic keratosis: Secondary | ICD-10-CM | POA: Diagnosis not present

## 2015-08-27 ENCOUNTER — Ambulatory Visit (INDEPENDENT_AMBULATORY_CARE_PROVIDER_SITE_OTHER): Payer: Medicare Other

## 2015-08-27 DIAGNOSIS — I5022 Chronic systolic (congestive) heart failure: Secondary | ICD-10-CM | POA: Diagnosis not present

## 2015-08-27 DIAGNOSIS — Z95810 Presence of automatic (implantable) cardiac defibrillator: Secondary | ICD-10-CM | POA: Diagnosis not present

## 2015-08-27 NOTE — Progress Notes (Signed)
EPIC Encounter for ICM Monitoring  Patient Name: [name] is a 81 y.o. male Date: 08/27/2015 Primary Care Physican: Irven Shelling, MD Primary Cardiologist: Radford Pax Electrophysiologist: Allred Dry Weight: 248 lb       Bi-V Pacing 98.6%  In the past month, have you:  1. Gained more than 2 pounds in a day or more than 5 pounds in a week? no  2. Had changes in your medications (with verification of current medications)? no  3. Had more shortness of breath than is usual for you? no  4. Limited your activity because of shortness of breath? no  5. Not been able to sleep because of shortness of breath? no  6. Had increased swelling in your feet or ankles? Yes between dates of 07/30/2015 to 08/14/2015  7. Had symptoms of dehydration (dizziness, dry mouth, increased thirst, decreased urine output) no  8. Had changes in sodium restriction? no  9. Been compliant with medication? Yes   ICM trend: 08/27/2015   Follow-up plan: ICM clinic phone appointment 09/29/2015 and appointment with Dr Irish Lack on 09/30/2015.  Spoke with wife [name].  Education given for patient to follow low sodium diet around 2000mg  a day and ~64 oz fluid a day.  She was unsure why he would have any fluid retention during 10/5 to 10/20.  No changes today.   Copy of note sent to patient's primary care physician, primary cardiologist, and device following physician.  Rosalene Billings, RN, CCM 08/27/2015 3:07 PM

## 2015-09-02 ENCOUNTER — Ambulatory Visit (INDEPENDENT_AMBULATORY_CARE_PROVIDER_SITE_OTHER): Payer: Medicare Other | Admitting: *Deleted

## 2015-09-02 DIAGNOSIS — Z7901 Long term (current) use of anticoagulants: Secondary | ICD-10-CM

## 2015-09-02 DIAGNOSIS — Z5181 Encounter for therapeutic drug level monitoring: Secondary | ICD-10-CM

## 2015-09-02 DIAGNOSIS — I4891 Unspecified atrial fibrillation: Secondary | ICD-10-CM

## 2015-09-02 DIAGNOSIS — I6789 Other cerebrovascular disease: Secondary | ICD-10-CM | POA: Diagnosis not present

## 2015-09-02 LAB — POCT INR: INR: 2.1

## 2015-09-03 DIAGNOSIS — M1711 Unilateral primary osteoarthritis, right knee: Secondary | ICD-10-CM | POA: Diagnosis not present

## 2015-09-10 DIAGNOSIS — M1711 Unilateral primary osteoarthritis, right knee: Secondary | ICD-10-CM | POA: Diagnosis not present

## 2015-09-16 ENCOUNTER — Other Ambulatory Visit: Payer: Self-pay | Admitting: Cardiology

## 2015-09-17 DIAGNOSIS — M1711 Unilateral primary osteoarthritis, right knee: Secondary | ICD-10-CM | POA: Diagnosis not present

## 2015-09-22 DIAGNOSIS — E1122 Type 2 diabetes mellitus with diabetic chronic kidney disease: Secondary | ICD-10-CM | POA: Diagnosis not present

## 2015-09-22 DIAGNOSIS — I129 Hypertensive chronic kidney disease with stage 1 through stage 4 chronic kidney disease, or unspecified chronic kidney disease: Secondary | ICD-10-CM | POA: Diagnosis not present

## 2015-09-22 DIAGNOSIS — E109 Type 1 diabetes mellitus without complications: Secondary | ICD-10-CM | POA: Diagnosis not present

## 2015-09-22 DIAGNOSIS — Z79899 Other long term (current) drug therapy: Secondary | ICD-10-CM | POA: Diagnosis not present

## 2015-09-22 DIAGNOSIS — K219 Gastro-esophageal reflux disease without esophagitis: Secondary | ICD-10-CM | POA: Diagnosis not present

## 2015-09-22 DIAGNOSIS — Z794 Long term (current) use of insulin: Secondary | ICD-10-CM | POA: Diagnosis not present

## 2015-09-22 DIAGNOSIS — E7800 Pure hypercholesterolemia, unspecified: Secondary | ICD-10-CM | POA: Diagnosis not present

## 2015-09-22 DIAGNOSIS — N183 Chronic kidney disease, stage 3 (moderate): Secondary | ICD-10-CM | POA: Diagnosis not present

## 2015-09-22 DIAGNOSIS — I480 Paroxysmal atrial fibrillation: Secondary | ICD-10-CM | POA: Diagnosis not present

## 2015-09-22 DIAGNOSIS — I5022 Chronic systolic (congestive) heart failure: Secondary | ICD-10-CM | POA: Diagnosis not present

## 2015-09-22 DIAGNOSIS — E039 Hypothyroidism, unspecified: Secondary | ICD-10-CM | POA: Diagnosis not present

## 2015-09-22 DIAGNOSIS — E1151 Type 2 diabetes mellitus with diabetic peripheral angiopathy without gangrene: Secondary | ICD-10-CM | POA: Diagnosis not present

## 2015-09-22 DIAGNOSIS — I25118 Atherosclerotic heart disease of native coronary artery with other forms of angina pectoris: Secondary | ICD-10-CM | POA: Diagnosis not present

## 2015-09-22 DIAGNOSIS — Z5181 Encounter for therapeutic drug level monitoring: Secondary | ICD-10-CM | POA: Diagnosis not present

## 2015-09-23 DIAGNOSIS — M47816 Spondylosis without myelopathy or radiculopathy, lumbar region: Secondary | ICD-10-CM | POA: Diagnosis not present

## 2015-09-26 ENCOUNTER — Encounter: Payer: Self-pay | Admitting: Family

## 2015-09-26 NOTE — Progress Notes (Signed)
Patient ID: [name], male   DOB: November 21, 1933, 81 y.o.   MRN: [id number]     Cardiology Office Note   Date:  09/30/2015   ID:  [name], DOB [birth year], MRN [id number]  PCP:  [name] Shelling, MD    No chief complaint on file.  F/u CAD  Wt Readings from Last 3 Encounters:  09/30/15 258 lb 14.4 oz (117.436 kg)  03/27/15 255 lb 3.2 oz (115.758 kg)  03/17/15 257 lb 9.6 oz (116.847 kg)       History of Present Illness: [name] is a 81 y.o. male  who has CAD and COPD. He has a left subclavian stenosis. No arm claudication. Occasional numbness at night in the hands bilaterally. Has persistent SHOB. He thinks this is related to his emphysema. He also has chronic back pain which limits his ability to exercise.  He was seen by Dr. Gwenlyn Found for left subclavian stenosis, since the subclavian disease would compromise the LIMA. He was managed conservatively due to lack of symptoms.  Since last year, he does not notice much change.  He is not walking much.  He just had injections in his back.  He had injections in his knee as well in 11/16.  No bleeding problems.  I personally reviewed pacer check.  He is euvolemic.  He checks BP at home daily and has not had any high readings.    Past Medical History  Diagnosis Date  . Dyspnea   . OSA (obstructive sleep apnea)   . HTN (hypertension)   . DM (diabetes mellitus), type 2 ([place])   . Hyperlipidemia   . Systolic congestive heart failure ([place]) 2009    s/p BiV ICD implantation by Dr Leonia Reeves (MDT)  . Atrial fibrillation ([place])     persistent, previously seen at [facility] and placed on amiodarone  . Hypothyroidism   . PNA (pneumonia)   . Cleft palate   . Nasal septal deviation   . Iron deficiency anemia   . GERD (gastroesophageal reflux disease)   . Benign prostatic hypertrophy   . Nephrolithiasis   . Psoriasis   . Seborrheic keratosis   . COPD with emphysema ([place]) 04/01/2010  . CAD (coronary artery disease)    multivessel s/p inferolateral wall MI with subsequent CABG 11/1998.  Cath 2009 with Patent grafts  . Ischemic dilated cardiomyopathy     EF 35-40% by MUGA 6/11  . PAF (paroxysmal atrial fibrillation) ([place])   . Peripheral arterial disease ([place])     left subclavian artery stenosis    Past Surgical History  Procedure Laterality Date  . Carpal tunnel release    . Left cleft palate and left cleft lip repair    . Cataract extraction    . C-spine surgery    . Bi-ventricular implantable cardioverter defibrillator  (crt-d)  10-08-08; 11-06-2013    Dr Leonia Reeves (MDT) implant for primary prevention; gen change to MDT VivaXT CRTD by Dr Rayann Heman  . Coronary artery bypass graft      LIMA to LAD, SVG to OM, SVG to diagonal  . Biv icd genertaor change out N/A 11/06/2013    Procedure: BIV ICD [place];  Surgeon: Coralyn Mark, MD;  Location: [facility] CATH LAB;  Service: Cardiovascular;  Laterality: N/A;     Current Outpatient Prescriptions  Medication Sig Dispense Refill  . albuterol (PROVENTIL HFA;VENTOLIN HFA) 108 (90 BASE) MCG/ACT inhaler Inhale 2 puffs into the lungs every 6 (six) hours as needed for wheezing or  shortness of breath. Must keep June 2016 appt. 1 Inhaler 0  . albuterol (PROVENTIL) (2.5 MG/3ML) 0.083% nebulizer solution USE 1 VIAL VIA NEBULIZER 4 TIMES A DAY AS DIRECTED 300 mL 2  . amiodarone (PACERONE) 200 MG tablet TAKE 1 TABLET (200 MG TOTAL) BY MOUTH DAILY. 30 tablet 6  . aspirin 81 MG tablet Take 81 mg by mouth daily.      [name] atorvastatin (LIPITOR) 80 MG tablet TAKE 1 TABLET (80 MG TOTAL) BY MOUTH DAILY. 30 tablet 2  . carvedilol (COREG) 6.25 MG tablet 1 TABLET BY MOUTH TWICE A DAY 60 tablet 10  . DULERA 100-5 MCG/ACT AERO TAKE 2 PUFFS BY MOUTH TWICE A DAY 1 Inhaler 3  . DULERA 100-5 MCG/ACT AERO TAKE 2 PUFFS BY MOUTH TWICE A DAY 13 Inhaler 2  . furosemide (LASIX) 40 MG tablet Take 40 mg by mouth 2 (two) times daily.    [name] HYDROcodone-acetaminophen (NORCO/VICODIN) 5-325 MG per  tablet Take 1 tablet by mouth daily as needed for moderate pain.     [name] insulin aspart (NOVOLOG) 100 UNIT/ML injection Inject 23 units into the skin before breakfast and lunch, inject 27units into the skin before supper    . insulin glargine (LANTUS SOLOSTAR) 100 UNIT/ML injection Inject 42 Units into the skin 2 (two) times daily.     [name] levothyroxine (SYNTHROID, LEVOTHROID) 100 MCG tablet Take 100 mcg by mouth daily before breakfast.     . Multiple Vitamin (MULTIVITAMIN) tablet Take 1 tablet by mouth daily.      [name] omeprazole (PRILOSEC) 20 MG capsule Take 20 mg by mouth daily.      [name] spironolactone (ALDACTONE) 25 MG tablet TAKE 1/2 TABLET (12.5 MG TOTAL) BY MOUTH DAILY. 45 tablet 10  . traMADol (ULTRAM) 50 MG tablet Take 1 tablet by mouth daily as needed (pain).     . valsartan (DIOVAN) 160 MG tablet Take 1 tablet by mouth daily.    . vitamin B-12 (CYANOCOBALAMIN) 1000 MCG tablet Take 1,000 mcg by mouth daily.    [name] warfarin (COUMADIN) 5 MG tablet TAKE AS DIRECTED BY COUMADIN CLINIC 40 tablet 3  . ZETIA 10 MG tablet TAKE 1 TABLET BY MOUTH EVERY DAY 90 tablet 1   No current facility-administered medications for this visit.    Allergies:   Review of patient's allergies indicates no known allergies.    Social History:  The patient  reports that he quit smoking about 16 years ago. His smoking use included Cigarettes. He has a 195 pack-year smoking history. He has never used smokeless tobacco. He reports that he does not drink alcohol or use illicit drugs.   Family History:  The patient's family history includes Asthma in his father; Heart disease in his brother; Stroke in his father.    ROS:  Please see the history of present illness.   Otherwise, review of systems are positive for joint pains as above.   All other systems are reviewed and negative.    PHYSICAL EXAM: VS:  BP 98/50 mmHg  Pulse 74  Ht 5\' 8"  (1.727 m)  Wt 258 lb 14.4 oz (117.436 kg)  BMI 39.37 kg/m2  SpO2 98% , BMI Body mass  index is 39.37 kg/(m^2). GEN: Well nourished, well developed, in no acute distress HEENT: normal Neck: no JVD, carotid bruits, or masses Cardiac: RRR; no murmurs, rubs, or gallops,1+ bilateral LE edema  Respiratory:  clear to auscultation bilaterally, normal work of breathing GI: soft, nontender, nondistended, + BS MS: no  deformity or atrophy Skin: warm and dry, no rash Neuro:  Strength and sensation are intact Psych: euthymic mood, full affect     Recent Labs: 11/12/2014: ALT 20   Lipid Panel    Component Value Date/Time   CHOL 137 11/12/2014 0921   CHOL  11/29/2008 0330    88        ATP III CLASSIFICATION:  <200     mg/dL   Desirable  200-239  mg/dL   Borderline High  >=240    mg/dL   High          TRIG 95 11/12/2014 0921   TRIG 52 11/29/2008 0330   HDL 50 11/12/2014 0921   HDL 34* 11/29/2008 0330   CHOLHDL 2.6 11/29/2008 0330   VLDL 10 11/29/2008 0330   LDLCALC 68 11/12/2014 0921   LDLCALC  11/29/2008 0330    44        Total Cholesterol/HDL:CHD Risk Coronary Heart Disease Risk Table                     Men   Women  1/2 Average Risk   3.4   3.3  Average Risk       5.0   4.4  2 X Average Risk   9.6   7.1  3 X Average Risk  23.4   11.0        Use the calculated Patient Ratio above and the CHD Risk Table to determine the patient's CHD Risk.        ATP III CLASSIFICATION (LDL):  <100     mg/dL   Optimal  100-129  mg/dL   Near or Above                    Optimal  130-159  mg/dL   Borderline  160-189  mg/dL   High  >190     mg/dL   Very High     Other studies Reviewed: Additional studies/ records that were reviewed today with results demonstrating: EF 40-45% in 12/14.   ASSESSMENT AND PLAN:  1. AFib: COumadin for stroke prevention.  No palpitations. 2. CAD: CABG in 2000. No sx like what he had at that time., LDL 55 in 7/15. Continue atorvastatin. 3. PAD: Left subclavian stenosis: No arm claudication or angina despite likely subclavian stenosis.   Circulation in LIMA to LAD could be compromised by subclavian stenosis. He knows also to look for any chest discomfort or anginal equivalent as this may be an indication for subclavian intervention as well. Followed by Dr. Gwenlyn Found.  No intervention planned at this time.  4. HTN: Controlled at home.  5. DM: Followed by Dr. [name], controlled per his report. 6. AICD: f/u with Dr. Rayann Heman.   Current medicines are reviewed at length with the patient today.  The patient concerns regarding his medicines were addressed.  The following changes have been made:  No change  Labs/ tests ordered today include:  No orders of the defined types were placed in this encounter.    Recommend 150 minutes/week of aerobic exercise Low fat, low carb, high fiber diet recommended  Disposition:   FU in 1 year   Teresita Madura., MD  09/30/2015 8:08 AM    [facility] [address], [place], [place]  [address] Phone: [phone]; Fax: [phone]

## 2015-09-29 ENCOUNTER — Ambulatory Visit (INDEPENDENT_AMBULATORY_CARE_PROVIDER_SITE_OTHER): Payer: Medicare Other

## 2015-09-29 ENCOUNTER — Telehealth: Payer: Self-pay

## 2015-09-29 DIAGNOSIS — M47816 Spondylosis without myelopathy or radiculopathy, lumbar region: Secondary | ICD-10-CM | POA: Diagnosis not present

## 2015-09-29 DIAGNOSIS — Z95810 Presence of automatic (implantable) cardiac defibrillator: Secondary | ICD-10-CM | POA: Diagnosis not present

## 2015-09-29 DIAGNOSIS — I5022 Chronic systolic (congestive) heart failure: Secondary | ICD-10-CM | POA: Diagnosis not present

## 2015-09-29 NOTE — Telephone Encounter (Signed)
ICM transmission received.  Attempted call to patient and wife [name].  No answer.

## 2015-09-30 ENCOUNTER — Ambulatory Visit (INDEPENDENT_AMBULATORY_CARE_PROVIDER_SITE_OTHER): Payer: Medicare Other | Admitting: *Deleted

## 2015-09-30 ENCOUNTER — Encounter: Payer: Self-pay | Admitting: Interventional Cardiology

## 2015-09-30 ENCOUNTER — Ambulatory Visit (INDEPENDENT_AMBULATORY_CARE_PROVIDER_SITE_OTHER): Payer: Medicare Other | Admitting: Interventional Cardiology

## 2015-09-30 VITALS — BP 98/50 | HR 74 | Ht 68.0 in | Wt 258.9 lb

## 2015-09-30 DIAGNOSIS — Z95810 Presence of automatic (implantable) cardiac defibrillator: Secondary | ICD-10-CM

## 2015-09-30 DIAGNOSIS — Z7901 Long term (current) use of anticoagulants: Secondary | ICD-10-CM

## 2015-09-30 DIAGNOSIS — E1151 Type 2 diabetes mellitus with diabetic peripheral angiopathy without gangrene: Secondary | ICD-10-CM

## 2015-09-30 DIAGNOSIS — I480 Paroxysmal atrial fibrillation: Secondary | ICD-10-CM | POA: Diagnosis not present

## 2015-09-30 DIAGNOSIS — I10 Essential (primary) hypertension: Secondary | ICD-10-CM

## 2015-09-30 DIAGNOSIS — I739 Peripheral vascular disease, unspecified: Secondary | ICD-10-CM

## 2015-09-30 DIAGNOSIS — Z5181 Encounter for therapeutic drug level monitoring: Secondary | ICD-10-CM

## 2015-09-30 DIAGNOSIS — I6789 Other cerebrovascular disease: Secondary | ICD-10-CM

## 2015-09-30 DIAGNOSIS — E785 Hyperlipidemia, unspecified: Secondary | ICD-10-CM

## 2015-09-30 DIAGNOSIS — I255 Ischemic cardiomyopathy: Secondary | ICD-10-CM

## 2015-09-30 DIAGNOSIS — I2510 Atherosclerotic heart disease of native coronary artery without angina pectoris: Secondary | ICD-10-CM | POA: Diagnosis not present

## 2015-09-30 DIAGNOSIS — I4891 Unspecified atrial fibrillation: Secondary | ICD-10-CM

## 2015-09-30 DIAGNOSIS — Z794 Long term (current) use of insulin: Secondary | ICD-10-CM

## 2015-09-30 LAB — POCT INR: INR: 2

## 2015-09-30 NOTE — Patient Instructions (Signed)

## 2015-09-30 NOTE — Telephone Encounter (Signed)
Spoke with patient.

## 2015-09-30 NOTE — Progress Notes (Signed)
EPIC Encounter for ICM Monitoring  Patient Name: [name] is a 81 y.o. male Date: 09/30/2015 Primary Care Physican: Irven Shelling, MD Primary Cardiologist: Irish Lack Electrophysiologist: Allred Dry Weight: Office weight 258 lb       Bi-V Pacing 98.3%  In the past month, have you:  1. Gained more than 2 pounds in a day or more than 5 pounds in a week? no  2. Had changes in your medications (with verification of current medications)? no  3. Had more shortness of breath than is usual for you? no  4. Limited your activity because of shortness of breath? no  5. Not been able to sleep because of shortness of breath? no  6. Had increased swelling in your feet or ankles? no  7. Had symptoms of dehydration (dizziness, dry mouth, increased thirst, decreased urine output) no  8. Had changes in sodium restriction? no  9. Been compliant with medication? Yes   ICM trend:   Follow-up plan: ICM clinic phone appointment on 10/31/2015.  Spoke with patient regarding home transmission during office visit with Dr Irish Lack.   Optivol thoracic impedance trending along baseline.  He denied any HF symptoms and has persistent SOB but is no worse.  No changes today.     Copy of note sent to patient's primary care physician, primary cardiologist, and device following physician.  Rosalene Billings, RN, CCM 09/30/2015 2:01 PM

## 2015-10-01 ENCOUNTER — Ambulatory Visit (HOSPITAL_COMMUNITY)
Admission: RE | Admit: 2015-10-01 | Discharge: 2015-10-01 | Disposition: A | Discharge status: Home / Self Care | Payer: Medicare Other | Source: Ambulatory Visit | Attending: Vascular Surgery | Admitting: Vascular Surgery

## 2015-10-01 ENCOUNTER — Encounter: Payer: Self-pay | Admitting: Vascular Surgery

## 2015-10-01 ENCOUNTER — Ambulatory Visit (INDEPENDENT_AMBULATORY_CARE_PROVIDER_SITE_OTHER): Payer: Medicare Other | Admitting: Vascular Surgery

## 2015-10-01 VITALS — BP 147/82 | HR 75 | Temp 97.0°F | Ht 68.0 in | Wt 257.3 lb

## 2015-10-01 DIAGNOSIS — I70209 Unspecified atherosclerosis of native arteries of extremities, unspecified extremity: Secondary | ICD-10-CM | POA: Diagnosis not present

## 2015-10-01 DIAGNOSIS — E1159 Type 2 diabetes mellitus with other circulatory complications: Secondary | ICD-10-CM

## 2015-10-01 DIAGNOSIS — I255 Ischemic cardiomyopathy: Secondary | ICD-10-CM

## 2015-10-01 NOTE — Progress Notes (Signed)
[facility] [place]  Patient name: [name] MRN: [id number] DOB: 1934-01-29 Sex: male  REASON FOR CONSULT: peripheral vascular disease. Referred by Dr. Joni Fears.  HPI: [name] is a 81 y.o. male, who is referred by Dr. Joni Fears. The patient has a history of diabetes and no pulses in the feet and he is referred for evaluation of peripheral vascular disease. He also has significant right knee pain. He was noted to have absent pedal pulses and for this reason was referred for evaluation. On my history, he denies any history of claudication, rest pain, or nonhealing ulcers.  He denies any history of DVT. He has had a previous stroke in December 2015 associated with right-sided weakness which resolved completely. He's had previous CABG with vein taken from the left leg.   I have reviewed his records that were sent from [place]. He does have right knee pain related to arthritis but because of his multiple medical comorbidities was not felt to be a good candidate for knee replacement. He is felt to have end-stage osteoarthritis of the right knee.  Past Medical History  Diagnosis Date  . Dyspnea   . OSA (obstructive sleep apnea)   . HTN (hypertension)   . DM (diabetes mellitus), type 2 ([place])   . Hyperlipidemia   . Systolic congestive heart failure ([place]) 2009    s/p BiV ICD implantation by Dr Leonia Reeves (MDT)  . Atrial fibrillation ([place])     persistent, previously seen at [facility] and placed on amiodarone  . Hypothyroidism   . PNA (pneumonia)   . Cleft palate   . Nasal septal deviation   . Iron deficiency anemia   . GERD (gastroesophageal reflux disease)   . Benign prostatic hypertrophy   . Nephrolithiasis   . Psoriasis   . Seborrheic keratosis   . COPD with emphysema ([place]) 04/01/2010  . CAD (coronary artery disease)     multivessel s/p inferolateral wall MI with subsequent CABG 11/1998.  Cath 2009 with Patent grafts  .  Ischemic dilated cardiomyopathy     EF 35-40% by MUGA 6/11  . PAF (paroxysmal atrial fibrillation) ([place])   . Peripheral arterial disease ([facility])     left subclavian artery stenosis  . Stroke ([place])   . Myocardial infarction [facility])     Family History  Problem Relation Age of Onset  . Asthma Father   . Stroke Father   . Heart disease Brother     SOCIAL HISTORY: Social History   Social History  . Marital Status: Married    Spouse Name: N/A  . Number of Children: N/A  . Years of Education: N/A   Occupational History  . retired     Engineer, agricultural   Social History Main Topics  . Smoking status: Former Smoker -- 3.00 packs/day for 65 years    Types: Cigarettes    Quit date: 10/25/1998  . Smokeless tobacco: Never Used  . Alcohol Use: No     Comment: remote history of heavy alcohol use  . Drug Use: No  . Sexual Activity: Not on file   Other Topics Concern  . Not on file   Social History Narrative   Lives [place]   Retired    No Known Allergies  Current Outpatient Prescriptions  Medication Sig Dispense Refill  . albuterol (PROVENTIL HFA;VENTOLIN HFA) 108 (90 BASE) MCG/ACT inhaler Inhale 2 puffs into the lungs every 6 (six) hours as needed for wheezing or shortness of  breath. Must keep June 2016 appt. 1 Inhaler 0  . albuterol (PROVENTIL) (2.5 MG/3ML) 0.083% nebulizer solution USE 1 VIAL VIA NEBULIZER 4 TIMES A DAY AS DIRECTED 300 mL 2  . amiodarone (PACERONE) 200 MG tablet TAKE 1 TABLET (200 MG TOTAL) BY MOUTH DAILY. 30 tablet 6  . aspirin 81 MG tablet Take 81 mg by mouth daily.      [name] atorvastatin (LIPITOR) 80 MG tablet TAKE 1 TABLET (80 MG TOTAL) BY MOUTH DAILY. 30 tablet 2  . carvedilol (COREG) 6.25 MG tablet 1 TABLET BY MOUTH TWICE A DAY 60 tablet 10  . DULERA 100-5 MCG/ACT AERO TAKE 2 PUFFS BY MOUTH TWICE A DAY 1 Inhaler 3  . DULERA 100-5 MCG/ACT AERO TAKE 2 PUFFS BY MOUTH TWICE A DAY 13 Inhaler 2  . furosemide (LASIX) 40 MG tablet Take 40 mg by mouth 2 (two)  times daily.    [name] HYDROcodone-acetaminophen (NORCO/VICODIN) 5-325 MG per tablet Take 1 tablet by mouth daily as needed for moderate pain.     [name] insulin aspart (NOVOLOG) 100 UNIT/ML injection Inject 23 units into the skin before breakfast and lunch, inject 27units into the skin before supper    . insulin glargine (LANTUS SOLOSTAR) 100 UNIT/ML injection Inject 42 Units into the skin 2 (two) times daily.     [name] levothyroxine (SYNTHROID, LEVOTHROID) 100 MCG tablet Take 100 mcg by mouth daily before breakfast.     . Multiple Vitamin (MULTIVITAMIN) tablet Take 1 tablet by mouth daily.      [name] omeprazole (PRILOSEC) 20 MG capsule Take 20 mg by mouth daily.      [name] spironolactone (ALDACTONE) 25 MG tablet TAKE 1/2 TABLET (12.5 MG TOTAL) BY MOUTH DAILY. 45 tablet 10  . traMADol (ULTRAM) 50 MG tablet Take 1 tablet by mouth daily as needed (pain).     . valsartan (DIOVAN) 160 MG tablet Take 1 tablet by mouth daily.    . vitamin B-12 (CYANOCOBALAMIN) 1000 MCG tablet Take 1,000 mcg by mouth daily.    [name] warfarin (COUMADIN) 5 MG tablet TAKE AS DIRECTED BY COUMADIN CLINIC 40 tablet 3  . ZETIA 10 MG tablet TAKE 1 TABLET BY MOUTH EVERY DAY 90 tablet 1   No current facility-administered medications for this visit.    REVIEW OF SYSTEMS:  [X]  denotes positive finding, [ ]  denotes negative finding Cardiac  Comments:  Chest pain or chest pressure:    Shortness of breath upon exertion: X   Short of breath when lying flat: X   Irregular heart rhythm: X       Vascular    Pain in calf, thigh, or hip brought on by ambulation:  Right knee pain only.  Pain in feet at night that wakes you up from your sleep:     Blood clot in your veins:    Leg swelling:  X       Pulmonary    Oxygen at home:    Productive cough:     Wheezing:  X       Neurologic    Sudden weakness in arms or legs:     Sudden numbness in arms or legs:  X Stroke in December 2015.  Sudden onset of difficulty speaking or slurred speech:    Temporary  loss of vision in one eye:     Problems with dizziness:  X       Gastrointestinal    Blood in stool:     Vomited blood:  Genitourinary    Burning when urinating:     Blood in urine:        Psychiatric    Major depression:         Hematologic    Bleeding problems: X   Problems with blood clotting too easily:        Skin    Rashes or ulcers:        Constitutional    Fever or chills:      PHYSICAL EXAM: Filed Vitals:   10/01/15 0840 10/01/15 0845  BP: 147/88 147/82  Pulse: 75   Temp: 97 F (36.1 C)   TempSrc: Oral   Height: 5\' 8"  (1.727 m)   Weight: 257 lb 4.8 oz (116.711 kg)   SpO2: 97%     GENERAL: The patient is a well-nourished male, in no acute distress. The vital signs are documented above. CARDIAC: There is a regular rate and rhythm.  VASCULAR: I do not detect carotid bruits. He has palpable femoral pulses. The only pedal pulses I can palpate is a right dorsalis pedis pulse. Both feet are warm and well perfused. He has no significant lower extremity swelling. PULMONARY: There is good air exchange bilaterally without wheezing or rales. ABDOMEN: Soft and non-tender with normal pitched bowel sounds.  MUSCULOSKELETAL: There are no major deformities or cyanosis. NEUROLOGIC: No focal weakness or paresthesias are detected. SKIN: There are no ulcers or rashes noted. He does have hyperpigmentation consistent with chronic venous insufficiency. PSYCHIATRIC: The patient has a normal affect.  DATA:   ARTERIAL DOPPLER STUDY: I have independently interpreted his arterial Doppler study today which shows triphasic Doppler signals in the posterior tibial and dorsalis pedis positions bilaterally. The vessels are noncompressible so ABIs could not be obtained. All of her toe pressure on the right is 145 mmHg and toe pressure on the left is 94 mmHg.  MEDICAL ISSUES:  PERIPHERAL VASCULAR DISEASE: Although he likely has some underlying peripheral vascular disease given his  multiple risk factors including diabetes, hypertension, hypercholesterolemia, and history of tobacco use in the past, he is currently asymptomatic and has triphasic Doppler signals in both feet. I do not think any vascular workup is indicated at this time. He is felt to be high-risk for knee replacement because of his multiple medical comorbidities. However from a vascular standpoint of think he would have adequate circulation for knee replacement if this were ever considered.  CHRONIC VENOUS INSUFFICIENCY: On physical exam he has evidence of chronic venous insufficiency. We have discussed the importance of intermittent leg elevation and also possible use of compression stockings.  HYPERTENSION: The patient's initial blood pressure today was elevated. We repeated this and this was still elevated. We have encouraged the patient to follow up with their primary care physician for management of their blood pressure.   Deitra Mayo [facility] [place]: [phone]

## 2015-10-02 DIAGNOSIS — L84 Corns and callosities: Secondary | ICD-10-CM | POA: Diagnosis not present

## 2015-10-02 DIAGNOSIS — M216X1 Other acquired deformities of right foot: Secondary | ICD-10-CM | POA: Diagnosis not present

## 2015-10-02 DIAGNOSIS — E119 Type 2 diabetes mellitus without complications: Secondary | ICD-10-CM | POA: Diagnosis not present

## 2015-10-02 DIAGNOSIS — L602 Onychogryphosis: Secondary | ICD-10-CM | POA: Diagnosis not present

## 2015-10-02 DIAGNOSIS — M216X2 Other acquired deformities of left foot: Secondary | ICD-10-CM | POA: Diagnosis not present

## 2015-10-06 DIAGNOSIS — J441 Chronic obstructive pulmonary disease with (acute) exacerbation: Secondary | ICD-10-CM | POA: Diagnosis not present

## 2015-10-16 ENCOUNTER — Other Ambulatory Visit: Payer: Self-pay | Admitting: Cardiology

## 2015-10-29 DIAGNOSIS — M47816 Spondylosis without myelopathy or radiculopathy, lumbar region: Secondary | ICD-10-CM | POA: Diagnosis not present

## 2015-10-31 ENCOUNTER — Ambulatory Visit (INDEPENDENT_AMBULATORY_CARE_PROVIDER_SITE_OTHER): Payer: Medicare Other

## 2015-10-31 DIAGNOSIS — I255 Ischemic cardiomyopathy: Secondary | ICD-10-CM | POA: Diagnosis not present

## 2015-10-31 DIAGNOSIS — Z95810 Presence of automatic (implantable) cardiac defibrillator: Secondary | ICD-10-CM

## 2015-10-31 DIAGNOSIS — I420 Dilated cardiomyopathy: Secondary | ICD-10-CM

## 2015-10-31 NOTE — Progress Notes (Signed)
EPIC Encounter for ICM Monitoring  Patient Name: [name] is a 82 y.o. male Date: 10/31/2015 Primary Care Physican: Irven Shelling, MD Primary Cardiologist: Irish Lack Electrophysiologist: Allred Dry Weight: 256 lbs   Bi-V Pacing 98%      In the past month, have you:  1. Gained more than 2 pounds in a day or more than 5 pounds in a week? no  2. Had changes in your medications (with verification of current medications)? no  3. Had more shortness of breath than is usual for you? no  4. Limited your activity because of shortness of breath? no  5. Not been able to sleep because of shortness of breath? no  6. Had increased swelling in your feet or ankles? no  7. Had symptoms of dehydration (dizziness, dry mouth, increased thirst, decreased urine output) no  8. Had changes in sodium restriction? no  9. Been compliant with medication? Yes   ICM trend: 10/31/2015     Follow-up plan: ICM clinic phone appointment on 12/05/2015.  Spoke with wife.  Optivol impedance below baseline in December and 10/29/2015 and 10/30/2015 suggesting fluid retention.  Impedance trending toward baseline 10/31/2015.  She stated patient had some back injections in the last couple of days.  He feet are slightly swollen but resolves after lying down.  She stated he has been doing ok except for back pain.  Encouraged her to call should he experience any HF symptoms. No changes today.   Copy of note sent to patient's primary care physician, primary cardiologist, and device following physician.  Rosalene Billings, RN, CCM 10/31/2015 11:32 AM

## 2015-11-11 ENCOUNTER — Ambulatory Visit (INDEPENDENT_AMBULATORY_CARE_PROVIDER_SITE_OTHER): Payer: Medicare Other

## 2015-11-11 DIAGNOSIS — I4891 Unspecified atrial fibrillation: Secondary | ICD-10-CM | POA: Diagnosis not present

## 2015-11-11 DIAGNOSIS — Z7901 Long term (current) use of anticoagulants: Secondary | ICD-10-CM | POA: Diagnosis not present

## 2015-11-11 DIAGNOSIS — Z5181 Encounter for therapeutic drug level monitoring: Secondary | ICD-10-CM

## 2015-11-11 DIAGNOSIS — I6789 Other cerebrovascular disease: Secondary | ICD-10-CM | POA: Diagnosis not present

## 2015-11-11 LAB — POCT INR: INR: 2.6

## 2015-11-24 ENCOUNTER — Other Ambulatory Visit: Payer: Self-pay | Admitting: Interventional Cardiology

## 2015-12-04 DIAGNOSIS — I70293 Other atherosclerosis of native arteries of extremities, bilateral legs: Secondary | ICD-10-CM | POA: Diagnosis not present

## 2015-12-04 DIAGNOSIS — L602 Onychogryphosis: Secondary | ICD-10-CM | POA: Diagnosis not present

## 2015-12-04 DIAGNOSIS — E1351 Other specified diabetes mellitus with diabetic peripheral angiopathy without gangrene: Secondary | ICD-10-CM | POA: Diagnosis not present

## 2015-12-05 ENCOUNTER — Ambulatory Visit (INDEPENDENT_AMBULATORY_CARE_PROVIDER_SITE_OTHER): Payer: Medicare Other

## 2015-12-05 DIAGNOSIS — Z95810 Presence of automatic (implantable) cardiac defibrillator: Secondary | ICD-10-CM

## 2015-12-05 DIAGNOSIS — I5022 Chronic systolic (congestive) heart failure: Secondary | ICD-10-CM | POA: Diagnosis not present

## 2015-12-08 NOTE — Progress Notes (Signed)
EPIC Encounter for ICM Monitoring  Patient Name: [name] is a 82 y.o. male Date: 12/08/2015 Primary Care Physican: Irven Shelling, MD Primary Cardiologist: Irish Lack Electrophysiologist: Allred Dry Weight: 256 lbs  Bi-V Pacing 98%       In the past month, have you:  1. Gained more than 2 pounds in a day or more than 5 pounds in a week? no  2. Had changes in your medications (with verification of current medications)? no  3. Had more shortness of breath than is usual for you? no  4. Limited your activity because of shortness of breath? no  5. Not been able to sleep because of shortness of breath? no  6. Had increased swelling in your feet or ankles? no  7. Had symptoms of dehydration (dizziness, dry mouth, increased thirst, decreased urine output) no  8. Had changes in sodium restriction? no  9. Been compliant with medication? Yes   ICM trend: 3 month view for 12/05/2015   ICM trend: 1 year view for 12/05/2015   Follow-up plan: ICM clinic phone appointment 01/09/2016.  Optivol thoracic impedance trending along reference line.  He reported he is feeling fine and denied any fluid symptoms.  He stated his breathing is no worse than usual.  No changes today.  Encouraged to call if he experiences any fluid symptoms.   Copy of note sent to patient's primary care physician, primary cardiologist, and device following physician.  Rosalene Billings, RN, CCM 12/08/2015 9:55 AM

## 2015-12-12 ENCOUNTER — Other Ambulatory Visit: Payer: Self-pay | Admitting: Pulmonary Disease

## 2015-12-23 ENCOUNTER — Ambulatory Visit (INDEPENDENT_AMBULATORY_CARE_PROVIDER_SITE_OTHER): Payer: Medicare Other | Admitting: *Deleted

## 2015-12-23 DIAGNOSIS — I4891 Unspecified atrial fibrillation: Secondary | ICD-10-CM | POA: Diagnosis not present

## 2015-12-23 DIAGNOSIS — I6789 Other cerebrovascular disease: Secondary | ICD-10-CM

## 2015-12-23 DIAGNOSIS — Z5181 Encounter for therapeutic drug level monitoring: Secondary | ICD-10-CM | POA: Diagnosis not present

## 2015-12-23 DIAGNOSIS — Z7901 Long term (current) use of anticoagulants: Secondary | ICD-10-CM | POA: Diagnosis not present

## 2015-12-23 DIAGNOSIS — M47816 Spondylosis without myelopathy or radiculopathy, lumbar region: Secondary | ICD-10-CM | POA: Diagnosis not present

## 2015-12-23 DIAGNOSIS — M545 Low back pain: Secondary | ICD-10-CM | POA: Diagnosis not present

## 2015-12-23 LAB — POCT INR: INR: 2.3

## 2015-12-29 DIAGNOSIS — R262 Difficulty in walking, not elsewhere classified: Secondary | ICD-10-CM | POA: Diagnosis not present

## 2015-12-29 DIAGNOSIS — M4306 Spondylolysis, lumbar region: Secondary | ICD-10-CM | POA: Diagnosis not present

## 2015-12-29 DIAGNOSIS — M6281 Muscle weakness (generalized): Secondary | ICD-10-CM | POA: Diagnosis not present

## 2015-12-29 DIAGNOSIS — M545 Low back pain: Secondary | ICD-10-CM | POA: Diagnosis not present

## 2016-01-05 DIAGNOSIS — M6281 Muscle weakness (generalized): Secondary | ICD-10-CM | POA: Diagnosis not present

## 2016-01-05 DIAGNOSIS — M4306 Spondylolysis, lumbar region: Secondary | ICD-10-CM | POA: Diagnosis not present

## 2016-01-05 DIAGNOSIS — M545 Low back pain: Secondary | ICD-10-CM | POA: Diagnosis not present

## 2016-01-05 DIAGNOSIS — R262 Difficulty in walking, not elsewhere classified: Secondary | ICD-10-CM | POA: Diagnosis not present

## 2016-01-07 DIAGNOSIS — M6281 Muscle weakness (generalized): Secondary | ICD-10-CM | POA: Diagnosis not present

## 2016-01-07 DIAGNOSIS — M545 Low back pain: Secondary | ICD-10-CM | POA: Diagnosis not present

## 2016-01-07 DIAGNOSIS — R262 Difficulty in walking, not elsewhere classified: Secondary | ICD-10-CM | POA: Diagnosis not present

## 2016-01-07 DIAGNOSIS — M4306 Spondylolysis, lumbar region: Secondary | ICD-10-CM | POA: Diagnosis not present

## 2016-01-09 ENCOUNTER — Ambulatory Visit (INDEPENDENT_AMBULATORY_CARE_PROVIDER_SITE_OTHER): Payer: Medicare Other

## 2016-01-09 DIAGNOSIS — Z95810 Presence of automatic (implantable) cardiac defibrillator: Secondary | ICD-10-CM

## 2016-01-09 DIAGNOSIS — R262 Difficulty in walking, not elsewhere classified: Secondary | ICD-10-CM | POA: Diagnosis not present

## 2016-01-09 DIAGNOSIS — I5022 Chronic systolic (congestive) heart failure: Secondary | ICD-10-CM | POA: Diagnosis not present

## 2016-01-09 DIAGNOSIS — M545 Low back pain: Secondary | ICD-10-CM | POA: Diagnosis not present

## 2016-01-09 DIAGNOSIS — M6281 Muscle weakness (generalized): Secondary | ICD-10-CM | POA: Diagnosis not present

## 2016-01-09 DIAGNOSIS — M4306 Spondylolysis, lumbar region: Secondary | ICD-10-CM | POA: Diagnosis not present

## 2016-01-09 NOTE — Progress Notes (Signed)
EPIC Encounter for ICM Monitoring  Patient Name: [name] is a 82 y.o. male Date: 01/09/2016 Primary Care Physican: Irven Shelling, MD Primary Cardiologist: Irish Lack Electrophysiologist: Allred Dry Weight: 260 lb   Bi-V Pacing 98.3%      In the past month, have you:  1. Gained more than 2 pounds in a day or more than 5 pounds in a week? no  2. Had changes in your medications (with verification of current medications)? no  3. Had more shortness of breath than is usual for you? Yes  4. Limited your activity because of shortness of breath? Yes  5. Not been able to sleep because of shortness of breath? no  6. Had increased swelling in your feet or ankles? no  7. Had symptoms of dehydration (dizziness, dry mouth, increased thirst, decreased urine output) no  8. Had changes in sodium restriction? no  9. Been compliant with medication? No, he admitted to missing 3 dosages of Furosemide in the last week.  Education given on the importance of taking meds as prescribed.  It not taking increases risk for hospitalization. Explained the Furosemide will help keep him from retaining fluid.     ICM trend: 3 month view for 01/09/2016  ICM trend: 1 year view for 01/09/2016   Follow-up plan: ICM clinic phone appointment on 01/15/2016.  Thoracic impedance below reference line from 12/07/2015 to 01/08/2016 suggesting fluid accumulation.  Thoracic impedance is returning to reference line on 01/08/2016.  Patient had leg swelling in February but none today.  Weight is stable.  Patient is wheezing and labored during phone call.  Difficult for him to talk due to breathing.   He stated he is using his inhalers.  He stated his breathing is not usually like this and has been difficult in the last 3 days.  He denied he has more difficulty breathing at night when lying down.   Advised to make sure he takes his Furosemide as ordered.  Education given to limit sodium intake to < 2000 mg and fluid intake  to 64 oz daily.  Encouraged to call for any fluid symptoms.  No changes today.    Reviewed transmission and symptoms with Dr Caryl Comes, DOD.  Dr Caryl Comes advised the thoracic impedance level does not correlate with the amount of breathing difficulty that I have described to him.   Patient denied fluid symptoms.  Weight is stable. Dr Caryl Comes advised for patient to call pulmonary MD for an appointment if he continues to have breathing difficulty.  If symptoms worsen over the weekend, advised to go to hospital.   Advised patient of Dr Aquilla Hacker recommendations to call Pulmonary MD for wheezing and difficulty in breathing.  Should he worsen over the weekend to go to the hospital.    Copy of note sent to patient's primary care physician, primary cardiologist, and device following physician.  Rosalene Billings, RN, CCM 01/09/2016 4:11 PM

## 2016-01-10 ENCOUNTER — Emergency Department (HOSPITAL_COMMUNITY): Payer: Medicare Other

## 2016-01-10 ENCOUNTER — Encounter (HOSPITAL_COMMUNITY): Payer: Self-pay | Admitting: Emergency Medicine

## 2016-01-10 ENCOUNTER — Inpatient Hospital Stay (HOSPITAL_COMMUNITY)
Admission: EM | Admit: 2016-01-10 | Discharge: 2016-01-16 | DRG: 190 | Disposition: A | Discharge status: Home / Self Care | Payer: Medicare Other | Attending: Internal Medicine | Admitting: Internal Medicine

## 2016-01-10 DIAGNOSIS — I255 Ischemic cardiomyopathy: Secondary | ICD-10-CM | POA: Diagnosis present

## 2016-01-10 DIAGNOSIS — J449 Chronic obstructive pulmonary disease, unspecified: Secondary | ICD-10-CM | POA: Diagnosis present

## 2016-01-10 DIAGNOSIS — E039 Hypothyroidism, unspecified: Secondary | ICD-10-CM | POA: Diagnosis present

## 2016-01-10 DIAGNOSIS — T380X5A Adverse effect of glucocorticoids and synthetic analogues, initial encounter: Secondary | ICD-10-CM | POA: Diagnosis present

## 2016-01-10 DIAGNOSIS — E1122 Type 2 diabetes mellitus with diabetic chronic kidney disease: Secondary | ICD-10-CM | POA: Diagnosis present

## 2016-01-10 DIAGNOSIS — I252 Old myocardial infarction: Secondary | ICD-10-CM | POA: Diagnosis not present

## 2016-01-10 DIAGNOSIS — N183 Chronic kidney disease, stage 3 (moderate): Secondary | ICD-10-CM | POA: Diagnosis present

## 2016-01-10 DIAGNOSIS — J440 Chronic obstructive pulmonary disease with acute lower respiratory infection: Secondary | ICD-10-CM | POA: Diagnosis present

## 2016-01-10 DIAGNOSIS — E662 Morbid (severe) obesity with alveolar hypoventilation: Secondary | ICD-10-CM | POA: Diagnosis present

## 2016-01-10 DIAGNOSIS — L409 Psoriasis, unspecified: Secondary | ICD-10-CM | POA: Diagnosis present

## 2016-01-10 DIAGNOSIS — Z9841 Cataract extraction status, right eye: Secondary | ICD-10-CM | POA: Diagnosis not present

## 2016-01-10 DIAGNOSIS — R0602 Shortness of breath: Secondary | ICD-10-CM | POA: Diagnosis not present

## 2016-01-10 DIAGNOSIS — I509 Heart failure, unspecified: Secondary | ICD-10-CM | POA: Diagnosis not present

## 2016-01-10 DIAGNOSIS — E785 Hyperlipidemia, unspecified: Secondary | ICD-10-CM | POA: Diagnosis present

## 2016-01-10 DIAGNOSIS — J9601 Acute respiratory failure with hypoxia: Secondary | ICD-10-CM | POA: Diagnosis present

## 2016-01-10 DIAGNOSIS — Z6841 Body Mass Index (BMI) 40.0 and over, adult: Secondary | ICD-10-CM | POA: Diagnosis not present

## 2016-01-10 DIAGNOSIS — K219 Gastro-esophageal reflux disease without esophagitis: Secondary | ICD-10-CM | POA: Diagnosis present

## 2016-01-10 DIAGNOSIS — Z87891 Personal history of nicotine dependence: Secondary | ICD-10-CM | POA: Diagnosis not present

## 2016-01-10 DIAGNOSIS — I248 Other forms of acute ischemic heart disease: Secondary | ICD-10-CM | POA: Diagnosis present

## 2016-01-10 DIAGNOSIS — N400 Enlarged prostate without lower urinary tract symptoms: Secondary | ICD-10-CM | POA: Diagnosis present

## 2016-01-10 DIAGNOSIS — L821 Other seborrheic keratosis: Secondary | ICD-10-CM | POA: Diagnosis present

## 2016-01-10 DIAGNOSIS — I5023 Acute on chronic systolic (congestive) heart failure: Secondary | ICD-10-CM | POA: Diagnosis present

## 2016-01-10 DIAGNOSIS — Z87730 Personal history of (corrected) cleft lip and palate: Secondary | ICD-10-CM | POA: Diagnosis not present

## 2016-01-10 DIAGNOSIS — E875 Hyperkalemia: Secondary | ICD-10-CM | POA: Diagnosis not present

## 2016-01-10 DIAGNOSIS — I2510 Atherosclerotic heart disease of native coronary artery without angina pectoris: Secondary | ICD-10-CM | POA: Diagnosis present

## 2016-01-10 DIAGNOSIS — Z823 Family history of stroke: Secondary | ICD-10-CM | POA: Diagnosis not present

## 2016-01-10 DIAGNOSIS — Z8673 Personal history of transient ischemic attack (TIA), and cerebral infarction without residual deficits: Secondary | ICD-10-CM

## 2016-01-10 DIAGNOSIS — N179 Acute kidney failure, unspecified: Secondary | ICD-10-CM | POA: Diagnosis present

## 2016-01-10 DIAGNOSIS — Z951 Presence of aortocoronary bypass graft: Secondary | ICD-10-CM | POA: Diagnosis not present

## 2016-01-10 DIAGNOSIS — Z794 Long term (current) use of insulin: Secondary | ICD-10-CM

## 2016-01-10 DIAGNOSIS — Z95810 Presence of automatic (implantable) cardiac defibrillator: Secondary | ICD-10-CM

## 2016-01-10 DIAGNOSIS — J439 Emphysema, unspecified: Secondary | ICD-10-CM | POA: Diagnosis not present

## 2016-01-10 DIAGNOSIS — I420 Dilated cardiomyopathy: Secondary | ICD-10-CM | POA: Diagnosis present

## 2016-01-10 DIAGNOSIS — J441 Chronic obstructive pulmonary disease with (acute) exacerbation: Principal | ICD-10-CM | POA: Diagnosis present

## 2016-01-10 DIAGNOSIS — I2489 Other forms of acute ischemic heart disease: Secondary | ICD-10-CM | POA: Diagnosis present

## 2016-01-10 DIAGNOSIS — G9340 Encephalopathy, unspecified: Secondary | ICD-10-CM | POA: Diagnosis not present

## 2016-01-10 DIAGNOSIS — R069 Unspecified abnormalities of breathing: Secondary | ICD-10-CM | POA: Diagnosis not present

## 2016-01-10 DIAGNOSIS — J189 Pneumonia, unspecified organism: Secondary | ICD-10-CM | POA: Diagnosis present

## 2016-01-10 DIAGNOSIS — Z9842 Cataract extraction status, left eye: Secondary | ICD-10-CM

## 2016-01-10 DIAGNOSIS — E1165 Type 2 diabetes mellitus with hyperglycemia: Secondary | ICD-10-CM | POA: Diagnosis present

## 2016-01-10 DIAGNOSIS — I708 Atherosclerosis of other arteries: Secondary | ICD-10-CM | POA: Diagnosis present

## 2016-01-10 DIAGNOSIS — I130 Hypertensive heart and chronic kidney disease with heart failure and stage 1 through stage 4 chronic kidney disease, or unspecified chronic kidney disease: Secondary | ICD-10-CM | POA: Diagnosis present

## 2016-01-10 DIAGNOSIS — I5022 Chronic systolic (congestive) heart failure: Secondary | ICD-10-CM | POA: Diagnosis not present

## 2016-01-10 DIAGNOSIS — I480 Paroxysmal atrial fibrillation: Secondary | ICD-10-CM | POA: Diagnosis not present

## 2016-01-10 DIAGNOSIS — Z7982 Long term (current) use of aspirin: Secondary | ICD-10-CM | POA: Diagnosis not present

## 2016-01-10 DIAGNOSIS — E1140 Type 2 diabetes mellitus with diabetic neuropathy, unspecified: Secondary | ICD-10-CM

## 2016-01-10 DIAGNOSIS — E1151 Type 2 diabetes mellitus with diabetic peripheral angiopathy without gangrene: Secondary | ICD-10-CM | POA: Diagnosis present

## 2016-01-10 DIAGNOSIS — Z7901 Long term (current) use of anticoagulants: Secondary | ICD-10-CM | POA: Diagnosis not present

## 2016-01-10 DIAGNOSIS — I10 Essential (primary) hypertension: Secondary | ICD-10-CM | POA: Diagnosis present

## 2016-01-10 DIAGNOSIS — R059 Cough, unspecified: Secondary | ICD-10-CM

## 2016-01-10 DIAGNOSIS — R05 Cough: Secondary | ICD-10-CM

## 2016-01-10 DIAGNOSIS — G4733 Obstructive sleep apnea (adult) (pediatric): Secondary | ICD-10-CM | POA: Diagnosis present

## 2016-01-10 DIAGNOSIS — I739 Peripheral vascular disease, unspecified: Secondary | ICD-10-CM | POA: Diagnosis present

## 2016-01-10 LAB — CBC WITH DIFFERENTIAL/PLATELET
Basophils Absolute: 0 10*3/uL (ref 0.0–0.1)
Basophils Relative: 0 %
Eosinophils Absolute: 0.1 10*3/uL (ref 0.0–0.7)
Eosinophils Relative: 2 %
HCT: 32.6 % — ABNORMAL LOW (ref 39.0–52.0)
Hemoglobin: 10 g/dL — ABNORMAL LOW (ref 13.0–17.0)
Lymphocytes Relative: 25 %
Lymphs Abs: 1.8 10*3/uL (ref 0.7–4.0)
MCH: 25.7 pg — ABNORMAL LOW (ref 26.0–34.0)
MCHC: 30.7 g/dL (ref 30.0–36.0)
MCV: 83.8 fL (ref 78.0–100.0)
Monocytes Absolute: 0.6 10*3/uL (ref 0.1–1.0)
Monocytes Relative: 9 %
Neutro Abs: 4.6 10*3/uL (ref 1.7–7.7)
Neutrophils Relative %: 64 %
Platelets: 195 10*3/uL (ref 150–400)
RBC: 3.89 MIL/uL — ABNORMAL LOW (ref 4.22–5.81)
RDW: 17.8 % — ABNORMAL HIGH (ref 11.5–15.5)
WBC: 7.1 10*3/uL (ref 4.0–10.5)

## 2016-01-10 LAB — COMPREHENSIVE METABOLIC PANEL
ALT: 24 U/L (ref 17–63)
AST: 33 U/L (ref 15–41)
Albumin: 3.3 g/dL — ABNORMAL LOW (ref 3.5–5.0)
Alkaline Phosphatase: 65 U/L (ref 38–126)
Anion gap: 5 (ref 5–15)
BUN: 19 mg/dL (ref 6–20)
CO2: 29 mmol/L (ref 22–32)
Calcium: 8.8 mg/dL — ABNORMAL LOW (ref 8.9–10.3)
Chloride: 109 mmol/L (ref 101–111)
Creatinine, Ser: 1.45 mg/dL — ABNORMAL HIGH (ref 0.61–1.24)
GFR calc Af Amer: 51 mL/min — ABNORMAL LOW (ref >60)
GFR calc non Af Amer: 44 mL/min — ABNORMAL LOW (ref >60)
Glucose, Bld: 186 mg/dL — ABNORMAL HIGH (ref 65–99)
Potassium: 4.5 mmol/L (ref 3.5–5.1)
Sodium: 143 mmol/L (ref 135–145)
Total Bilirubin: 0.2 mg/dL — ABNORMAL LOW (ref 0.3–1.2)
Total Protein: 6.2 g/dL — ABNORMAL LOW (ref 6.5–8.1)

## 2016-01-10 LAB — I-STAT TROPONIN, ED: Troponin i, poc: 0.05 ng/mL (ref 0.00–0.08)

## 2016-01-10 LAB — BRAIN NATRIURETIC PEPTIDE: B Natriuretic Peptide: 532.2 pg/mL — ABNORMAL HIGH (ref 0.0–100.0)

## 2016-01-10 MED ORDER — ADULT MULTIVITAMIN W/MINERALS CH
1.0000 | ORAL_TABLET | Freq: Every day | ORAL | Status: DC
  Administered 2016-01-11 – 2016-01-16 (×6): 1 via ORAL
  Filled 2016-01-10 (×6): qty 1

## 2016-01-10 MED ORDER — WARFARIN SODIUM 5 MG PO TABS
5.0000 mg | ORAL_TABLET | Freq: Every day | ORAL | Status: DC
  Administered 2016-01-11 – 2016-01-14 (×4): 5 mg via ORAL
  Filled 2016-01-10 (×4): qty 1

## 2016-01-10 MED ORDER — PREDNISONE 50 MG PO TABS
50.0000 mg | ORAL_TABLET | Freq: Every day | ORAL | Status: DC
  Administered 2016-01-11 – 2016-01-12 (×2): 50 mg via ORAL
  Filled 2016-01-10 (×2): qty 1

## 2016-01-10 MED ORDER — ATORVASTATIN CALCIUM 80 MG PO TABS
80.0000 mg | ORAL_TABLET | Freq: Every day | ORAL | Status: DC
  Administered 2016-01-11 – 2016-01-15 (×5): 80 mg via ORAL
  Filled 2016-01-10 (×5): qty 1

## 2016-01-10 MED ORDER — SODIUM CHLORIDE 0.9 % IV SOLN
INTRAVENOUS | Status: DC
  Administered 2016-01-10: 23:00:00 via INTRAVENOUS

## 2016-01-10 MED ORDER — INSULIN ASPART 100 UNIT/ML ~~LOC~~ SOLN
0.0000 [IU] | Freq: Three times a day (TID) | SUBCUTANEOUS | Status: DC
  Administered 2016-01-11: 5 [IU] via SUBCUTANEOUS
  Administered 2016-01-11: 7 [IU] via SUBCUTANEOUS
  Administered 2016-01-12: 5 [IU] via SUBCUTANEOUS
  Administered 2016-01-12: 3 [IU] via SUBCUTANEOUS
  Administered 2016-01-12: 5 [IU] via SUBCUTANEOUS
  Administered 2016-01-13: 7 [IU] via SUBCUTANEOUS
  Administered 2016-01-13 (×2): 9 [IU] via SUBCUTANEOUS
  Administered 2016-01-14: 3 [IU] via SUBCUTANEOUS
  Administered 2016-01-14: 5 [IU] via SUBCUTANEOUS
  Administered 2016-01-14: 2 [IU] via SUBCUTANEOUS
  Administered 2016-01-15: 1 [IU] via SUBCUTANEOUS
  Administered 2016-01-15 – 2016-01-16 (×2): 2 [IU] via SUBCUTANEOUS

## 2016-01-10 MED ORDER — INSULIN ASPART 100 UNIT/ML ~~LOC~~ SOLN
23.0000 [IU] | Freq: Three times a day (TID) | SUBCUTANEOUS | Status: DC
  Administered 2016-01-11 – 2016-01-14 (×11): 23 [IU] via SUBCUTANEOUS

## 2016-01-10 MED ORDER — HYDROCODONE-ACETAMINOPHEN 5-325 MG PO TABS
1.0000 | ORAL_TABLET | Freq: Every day | ORAL | Status: DC | PRN
  Administered 2016-01-13: 1 via ORAL
  Filled 2016-01-10: qty 1

## 2016-01-10 MED ORDER — AZITHROMYCIN 500 MG PO TABS
500.0000 mg | ORAL_TABLET | Freq: Every day | ORAL | Status: DC
  Administered 2016-01-11 (×2): 500 mg via ORAL
  Filled 2016-01-10 (×2): qty 1

## 2016-01-10 MED ORDER — ASPIRIN EC 81 MG PO TBEC
81.0000 mg | DELAYED_RELEASE_TABLET | Freq: Every day | ORAL | Status: DC
  Administered 2016-01-11 – 2016-01-16 (×6): 81 mg via ORAL
  Filled 2016-01-10 (×6): qty 1

## 2016-01-10 MED ORDER — IRBESARTAN 150 MG PO TABS
150.0000 mg | ORAL_TABLET | Freq: Every day | ORAL | Status: DC
  Administered 2016-01-11 – 2016-01-12 (×2): 150 mg via ORAL
  Filled 2016-01-10 (×3): qty 1

## 2016-01-10 MED ORDER — SPIRONOLACTONE 25 MG PO TABS
12.5000 mg | ORAL_TABLET | Freq: Every day | ORAL | Status: DC
  Administered 2016-01-11 – 2016-01-16 (×6): 12.5 mg via ORAL
  Filled 2016-01-10 (×6): qty 1

## 2016-01-10 MED ORDER — MOMETASONE FURO-FORMOTEROL FUM 100-5 MCG/ACT IN AERO
2.0000 | INHALATION_SPRAY | Freq: Two times a day (BID) | RESPIRATORY_TRACT | Status: DC
  Administered 2016-01-11 – 2016-01-14 (×5): 2 via RESPIRATORY_TRACT
  Filled 2016-01-10 (×2): qty 8.8

## 2016-01-10 MED ORDER — SODIUM CHLORIDE 0.9 % IV SOLN: INTRAVENOUS | Status: DC

## 2016-01-10 MED ORDER — PANTOPRAZOLE SODIUM 40 MG PO TBEC
40.0000 mg | DELAYED_RELEASE_TABLET | Freq: Every day | ORAL | Status: DC
Start: 2016-01-11 — End: 2016-01-16
  Administered 2016-01-11 – 2016-01-16 (×6): 40 mg via ORAL
  Filled 2016-01-10 (×6): qty 1

## 2016-01-10 MED ORDER — CARVEDILOL 6.25 MG PO TABS
6.2500 mg | ORAL_TABLET | Freq: Two times a day (BID) | ORAL | Status: DC
  Administered 2016-01-11 – 2016-01-16 (×12): 6.25 mg via ORAL
  Filled 2016-01-10 (×12): qty 1

## 2016-01-10 MED ORDER — VITAMIN B-12 1000 MCG PO TABS
1000.0000 ug | ORAL_TABLET | Freq: Every day | ORAL | Status: DC
  Administered 2016-01-11 – 2016-01-16 (×6): 1000 ug via ORAL
  Filled 2016-01-10 (×6): qty 1

## 2016-01-10 MED ORDER — IPRATROPIUM-ALBUTEROL 0.5-2.5 (3) MG/3ML IN SOLN
3.0000 mL | Freq: Once | RESPIRATORY_TRACT | Status: AC
  Administered 2016-01-10: 3 mL via RESPIRATORY_TRACT
  Filled 2016-01-10: qty 3

## 2016-01-10 MED ORDER — FUROSEMIDE 40 MG PO TABS
40.0000 mg | ORAL_TABLET | Freq: Two times a day (BID) | ORAL | Status: DC
Start: 2016-01-11 — End: 2016-01-11
  Administered 2016-01-11: 40 mg via ORAL
  Filled 2016-01-10: qty 1

## 2016-01-10 MED ORDER — INSULIN GLARGINE 100 UNIT/ML ~~LOC~~ SOLN
42.0000 [IU] | Freq: Two times a day (BID) | SUBCUTANEOUS | Status: DC
  Administered 2016-01-11 – 2016-01-14 (×8): 42 [IU] via SUBCUTANEOUS
  Filled 2016-01-10 (×9): qty 0.42

## 2016-01-10 MED ORDER — ONDANSETRON HCL 4 MG/2ML IJ SOLN
4.0000 mg | Freq: Once | INTRAMUSCULAR | Status: AC
  Administered 2016-01-10: 4 mg via INTRAVENOUS
  Filled 2016-01-10: qty 2

## 2016-01-10 MED ORDER — AMIODARONE HCL 200 MG PO TABS
200.0000 mg | ORAL_TABLET | Freq: Every day | ORAL | Status: DC
  Administered 2016-01-11 – 2016-01-16 (×6): 200 mg via ORAL
  Filled 2016-01-10 (×6): qty 1

## 2016-01-10 MED ORDER — FUROSEMIDE 10 MG/ML IJ SOLN
40.0000 mg | Freq: Once | INTRAMUSCULAR | Status: AC
  Administered 2016-01-11: 40 mg via INTRAVENOUS
  Filled 2016-01-10: qty 4

## 2016-01-10 MED ORDER — TRAMADOL HCL 50 MG PO TABS: 50.0000 mg | ORAL_TABLET | Freq: Every day | ORAL | Status: DC | PRN

## 2016-01-10 MED ORDER — IPRATROPIUM-ALBUTEROL 0.5-2.5 (3) MG/3ML IN SOLN
3.0000 mL | RESPIRATORY_TRACT | Status: DC
  Administered 2016-01-11 (×3): 3 mL via RESPIRATORY_TRACT
  Filled 2016-01-10 (×3): qty 3

## 2016-01-10 MED ORDER — EZETIMIBE 10 MG PO TABS
10.0000 mg | ORAL_TABLET | Freq: Every day | ORAL | Status: DC
  Administered 2016-01-11 – 2016-01-16 (×6): 10 mg via ORAL
  Filled 2016-01-10 (×9): qty 1

## 2016-01-10 MED ORDER — LEVOTHYROXINE SODIUM 100 MCG PO TABS
100.0000 ug | ORAL_TABLET | Freq: Every day | ORAL | Status: DC
  Administered 2016-01-11 – 2016-01-16 (×6): 100 ug via ORAL
  Filled 2016-01-10 (×6): qty 1

## 2016-01-10 NOTE — Progress Notes (Signed)
Pt taken off BIPAP at this time and place on 4L [place]. Pt able to speak in full sentences with no distress. Diminished BBS with no wheezes. Hospitalist at bedside.

## 2016-01-10 NOTE — ED Notes (Signed)
Pt in EMS on cpap, resp distress. Inc SOB. Sats 100% on CPAP. Hx CHF, COPD. Given 125 solumedrol, 2g Mg, 0.5 atrovent, 5 albuterol. Had to take on CPAP en route bc BP not tolerating

## 2016-01-10 NOTE — H&P (Signed)
History and Physical  [name] [id number] DOB: Sep 20, 1934 DOA: 01/10/2016  PCP: Irven Shelling, MD   Chief Complaint: Dyspnea  History of Present Illness:  Patient is a 82 yo male with history of COPD on CPAP, left subclavian stenosis, CHF ( systolic, EF [id number] in [id number]), Afib s/p biventricular defibrillator, CAD s/p CABG, hypothyroidism, HTN, CVA who came with cc of dyspnea that has been worsening for the past week. This is associated with wheezing and cough of clear sputum but not orthopnea or PND. This is also associated with bilateral LE edema but no chest pain. Patient denies preceding URI,fever or chills. He denies any N/V/D/C/abd pain/dysuria.   Review of Systems:  CONSTITUTIONAL:  No night sweats.  +fatigue  No fever or chills. Eyes:  No visual changes.  No eye pain.  No eye discharge.   ENT:    No epistaxis.  No sinus pain.  No sore throat.  No ear pain.  No congestion. RESPIRATORY:  +cough.  +wheeze.  No hemoptysis.  +shortness of breath. CARDIOVASCULAR:  No chest pains.  No palpitations. GASTROINTESTINAL:  No abdominal pain.  No nausea or vomiting.  No diarrhea or constipation.  No hematemesis.  No hematochezia.  No melena. GENITOURINARY:  No urgency.  No frequency.  No dysuria.  No hematuria.  No obstructive symptoms.  No discharge.  No pain.  No significant abnormal bleeding. MUSCULOSKELETAL:  No musculoskeletal pain.  No joint swelling.  No arthritis. NEUROLOGICAL:  No confusion.  No weakness. No headache. No seizure. PSYCHIATRIC:  No depression. No anxiety. No suicidal ideation. SKIN:  No rashes.  No lesions.  No wounds. ENDOCRINE:  No unexplained weight loss.  No polydipsia.  No polyuria.  No polyphagia. HEMATOLOGIC:  No anemia.  No purpura.  No petechiae.  No bleeding.  ALLERGIC AND IMMUNOLOGIC:  No pruritus.  No swelling Other:  Past Medical and Surgical History:   Past Medical History  Diagnosis Date  . Dyspnea   . OSA (obstructive sleep  apnea)   . HTN (hypertension)   . DM (diabetes mellitus), type 2 ([place])   . Hyperlipidemia   . Systolic congestive heart failure ([place]) 2009    s/p BiV ICD implantation by Dr Leonia Reeves ([facility])  . Atrial fibrillation ([place])     persistent, previously seen at [facility] and placed on amiodarone  . Hypothyroidism   . PNA (pneumonia)   . Cleft palate   . Nasal septal deviation   . Iron deficiency anemia   . GERD (gastroesophageal reflux disease)   . Benign prostatic hypertrophy   . Nephrolithiasis   . Psoriasis   . Seborrheic keratosis   . COPD with emphysema ([place]) 04/01/2010  . CAD (coronary artery disease)     multivessel s/p inferolateral wall MI with subsequent CABG 11/1998.  Cath 2009 with Patent grafts  . Ischemic dilated cardiomyopathy     EF 35-40% by MUGA 6/11  . PAF (paroxysmal atrial fibrillation) ([place])   . Peripheral arterial disease ([facility])     left subclavian artery stenosis  . Stroke ([place])   . Myocardial infarction [facility])    Past Surgical History  Procedure Laterality Date  . Carpal tunnel release    . Left cleft palate and left cleft lip repair    . Cataract extraction    . C-spine surgery    . Bi-ventricular implantable cardioverter defibrillator  (crt-d)  10-08-08; 11-06-2013    Dr Leonia Reeves ([facility]) implant for primary prevention; gen change to MDT VivaXT CRTD by Dr  Allred  . Coronary artery bypass graft      LIMA to LAD, SVG to OM, SVG to diagonal  . Biv icd genertaor change out N/A 11/06/2013    Procedure: BIV ICD [place];  Surgeon: Coralyn Mark, MD;  [facility] C[facility];  Service: Cardiovascular;  Laterality: N/A;    Social History:   reports that he quit smoking about 17 years ago. His smoking use included Cigarettes. He has a 195 pack-year smoking history. He has never used smokeless tobacco. He reports that he does not drink alcohol or use illicit drugs.   No Known Allergies  Family History  Problem Relation Age of Onset  . Asthma Father   . Stroke  Father   . Heart disease Brother       Prior to Admission medications   Medication Sig Start Date End Date Taking? Authorizing Provider  albuterol (PROVENTIL HFA;VENTOLIN HFA) 108 (90 BASE) MCG/ACT inhaler Inhale 2 puffs into the lungs every 6 (six) hours as needed for wheezing or shortness of breath. Must keep June 2016 appt. 03/04/15  Yes Chesley Mires, MD  albuterol (PROVENTIL) (2.5 MG/3ML) 0.083% nebulizer solution USE 1 VIAL VIA NEBULIZER 4 TIMES A DAY AS DIRECTED Patient taking differently: USE 1 VIAL VIA NEBULIZER 2 TIMES A DAY AS DIRECTED 08/19/15  Yes Chesley Mires, MD  amiodarone (PACERONE) 200 MG tablet TAKE 1 TABLET BY MOUTH EVERY DAY 11/24/15  Yes Jettie Booze, MD  aspirin 81 MG tablet Take 81 mg by mouth daily.     Yes Historical Provider, MD  atorvastatin (LIPITOR) 80 MG tablet TAKE 1 TABLET (80 MG TOTAL) BY MOUTH DAILY. 10/16/15  Yes Sueanne Margarita, MD  carvedilol (COREG) 6.25 MG tablet TAKE 1 TABLET BY MOUTH TWICE A DAY 10/16/15  Yes Sueanne Margarita, MD  DULERA 100-5 MCG/ACT AERO TAKE 2 PUFFS BY MOUTH TWICE A DAY 12/12/15  Yes Chesley Mires, MD  furosemide (LASIX) 40 MG tablet Take 40 mg by mouth 2 (two) times daily.   Yes Historical Provider, MD  HYDROcodone-acetaminophen (NORCO/VICODIN) 5-325 MG per tablet Take 1 tablet by mouth daily as needed for moderate pain.  09/26/13  Yes Historical Provider, MD  insulin aspart (NOVOLOG) 100 UNIT/ML injection Inject 23 units into the skin before breakfast and lunch, inject 27units into the skin before supper   Yes Historical Provider, MD  insulin glargine (LANTUS SOLOSTAR) 100 UNIT/ML injection Inject 42 Units into the skin 2 (two) times daily.    Yes Historical Provider, MD  levothyroxine (SYNTHROID, LEVOTHROID) 100 MCG tablet Take 100 mcg by mouth daily before breakfast.  09/23/15  Yes Historical Provider, MD  Multiple Vitamin (MULTIVITAMIN) tablet Take 1 tablet by mouth daily.     Yes Historical Provider, MD  omeprazole (PRILOSEC) 20 MG  capsule Take 20 mg by mouth daily.     Yes Historical Provider, MD  spironolactone (ALDACTONE) 25 MG tablet TAKE 1/2 TABLET (12.5 MG TOTAL) BY MOUTH DAILY. 06/23/15  Yes Jettie Booze, MD  traMADol (ULTRAM) 50 MG tablet Take 1 tablet by mouth daily as needed (pain).  10/16/14  Yes Historical Provider, MD  valsartan (DIOVAN) 160 MG tablet Take 1 tablet by mouth daily. 10/12/14  Yes Historical Provider, MD  vitamin B-12 (CYANOCOBALAMIN) 1000 MCG tablet Take 1,000 mcg by mouth daily.   Yes Historical Provider, MD  warfarin (COUMADIN) 5 MG tablet TAKE AS DIRECTED BY COUMADIN CLINIC Patient taking differently: Take 5mg  everyday except on Tuesday and Thursdays take  7.5mg  09/16/15  Yes Sueanne Margarita, MD  ZETIA 10 MG tablet TAKE 1 TABLET BY MOUTH EVERY DAY 12/18/14  Yes Jettie Booze, MD    Physical Exam: BP 129/81 mmHg  Pulse 69  Temp(Src) 97.9 F (36.6 C) (Axillary)  Resp 22  Ht 5\' 8"  (1.727 m)  Wt 122.471 kg (270 lb)  BMI 41.06 kg/m2  SpO2 95%  GENERAL :  appears to be in mild acute distress. HEAD: normocephalic. EYES: PERRL, EOMI.  NECK: Neck supple CARDIAC: Normal S1 and S2. Distant heart sounds, could not appreciate a murmur or a gallop. LUNGS: poor air entry, bilateral crackles with minimal wheezing.  ABDOMEN: Positive bowel sounds. Soft, nondistended, nontender.  EXTREMITIES: No significant deformity or joint abnormality. +edema.  NEUROLOGICAL: The mental examination revealed the patient was oriented to person, place, and time.CN II-XII intact. SKIN: skin tags, keratosis. PSYCHIATRIC:  The patient was able to demonstrate good judgement and reason, without hallucinations, abnormal affect or abnormal behaviors during the examination. Patient is not suicidal.          Labs on Admission:  Reviewed.   Radiological Exams on Admission: Dg Chest Port 1 View  01/10/2016  CLINICAL DATA:  RESP DISTRESS, COPD, DM, AFIB EXAM: PORTABLE CHEST 1 VIEW COMPARISON:  01/17/2014,  09/02/2011 FINDINGS: Status post median sternotomy. Left-sided AICD lead overlies the right atrium. The heart is enlarged. There is increased pulmonary opacity bilaterally, right greater than left, consistent with edema and/or infectious process. Lung bases are excluded. IMPRESSION: 1. Cardiomegaly. 2. Suspect interstitial edema. Possible superimposed infectious infiltrate in the right lung. Electronically Signed   By: Nolon Nations M.D.   On: 01/10/2016 22:16    EKG:  Independently reviewed. Paced.  Assessment/Plan  Acute on chronic CHF: Elevated BNP and CXR with interstitial edema. Exam with crackles.  Will give lasix 40 mg IV once and continue home doses: f/u I/Os , weight. R/o ACS with serial trops. EKG is paced. Cardio consult in am.  Continue ARBS/BB Will check Echo  COPD exacerbation: Continue Douneb Q4H and inhaled steroids.  Given Solumedrol in the ER. Continue prednisone PO Start Azithromycin 500 mg PO  CAD: CABG in 200, no chest pain, continue asp,plavix, statin, BB/ARBS.  Afib: continue coumadin and amio.  PAD: L.subclavian stenosis. No arm claudication. F/u outpatient  DM: continue home insulin dosing with low dose correction.  AICD: f/u with cardio  HTN: continue home meds  Hypothyroidism: continue synthroid.   CKD: Cr close to baseline. Will monitor.  DVT prophylaxis: on coumadin: check INR in am GI prophylaxis: PPI Consultants: Cardio in am Code Status: Full    Gennaro Africa M.D [facility]

## 2016-01-11 ENCOUNTER — Inpatient Hospital Stay (HOSPITAL_COMMUNITY): Payer: Medicare Other

## 2016-01-11 DIAGNOSIS — E1140 Type 2 diabetes mellitus with diabetic neuropathy, unspecified: Secondary | ICD-10-CM

## 2016-01-11 DIAGNOSIS — J9601 Acute respiratory failure with hypoxia: Secondary | ICD-10-CM | POA: Diagnosis present

## 2016-01-11 DIAGNOSIS — N179 Acute kidney failure, unspecified: Secondary | ICD-10-CM | POA: Diagnosis present

## 2016-01-11 DIAGNOSIS — N183 Chronic kidney disease, stage 3 (moderate): Secondary | ICD-10-CM

## 2016-01-11 DIAGNOSIS — Z794 Long term (current) use of insulin: Secondary | ICD-10-CM

## 2016-01-11 DIAGNOSIS — I509 Heart failure, unspecified: Secondary | ICD-10-CM

## 2016-01-11 DIAGNOSIS — I5023 Acute on chronic systolic (congestive) heart failure: Secondary | ICD-10-CM | POA: Diagnosis present

## 2016-01-11 DIAGNOSIS — I248 Other forms of acute ischemic heart disease: Secondary | ICD-10-CM | POA: Diagnosis present

## 2016-01-11 LAB — COMPREHENSIVE METABOLIC PANEL
ALT: 25 U/L (ref 17–63)
AST: 29 U/L (ref 15–41)
Albumin: 3.3 g/dL — ABNORMAL LOW (ref 3.5–5.0)
Alkaline Phosphatase: 70 U/L (ref 38–126)
Anion gap: 10 (ref 5–15)
BUN: 21 mg/dL — ABNORMAL HIGH (ref 6–20)
CO2: 30 mmol/L (ref 22–32)
Calcium: 8.8 mg/dL — ABNORMAL LOW (ref 8.9–10.3)
Chloride: 102 mmol/L (ref 101–111)
Creatinine, Ser: 1.43 mg/dL — ABNORMAL HIGH (ref 0.61–1.24)
GFR calc Af Amer: 52 mL/min — ABNORMAL LOW (ref >60)
GFR calc non Af Amer: 45 mL/min — ABNORMAL LOW (ref >60)
Glucose, Bld: 261 mg/dL — ABNORMAL HIGH (ref 65–99)
Potassium: 4.9 mmol/L (ref 3.5–5.1)
Sodium: 142 mmol/L (ref 135–145)
Total Bilirubin: 0.4 mg/dL (ref 0.3–1.2)
Total Protein: 6.5 g/dL (ref 6.5–8.1)

## 2016-01-11 LAB — GLUCOSE, CAPILLARY
Glucose-Capillary: 266 mg/dL — ABNORMAL HIGH (ref 65–99)
Glucose-Capillary: 286 mg/dL — ABNORMAL HIGH (ref 65–99)
Glucose-Capillary: 346 mg/dL — ABNORMAL HIGH (ref 65–99)

## 2016-01-11 LAB — PROTIME-INR
INR: 2.18 — ABNORMAL HIGH (ref 0.00–1.49)
Prothrombin Time: 24.1 seconds — ABNORMAL HIGH (ref 11.6–15.2)

## 2016-01-11 LAB — ECHOCARDIOGRAM COMPLETE
Height: 68 in
Weight: 4218.72 oz

## 2016-01-11 LAB — APTT: aPTT: 35 seconds (ref 24–37)

## 2016-01-11 LAB — MRSA PCR SCREENING: MRSA by PCR: NEGATIVE

## 2016-01-11 LAB — TROPONIN I: Troponin I: 0.04 ng/mL — ABNORMAL HIGH (ref <0.031)

## 2016-01-11 MED ORDER — IPRATROPIUM-ALBUTEROL 0.5-2.5 (3) MG/3ML IN SOLN: 3.0000 mL | Freq: Three times a day (TID) | RESPIRATORY_TRACT | Status: DC

## 2016-01-11 MED ORDER — DEXTROSE 5 % IV SOLN
1.0000 g | INTRAVENOUS | Status: DC
  Administered 2016-01-11 – 2016-01-15 (×5): 1 g via INTRAVENOUS
  Filled 2016-01-11 (×6): qty 10

## 2016-01-11 MED ORDER — WARFARIN - PHYSICIAN DOSING INPATIENT: Freq: Every day | Status: DC

## 2016-01-11 MED ORDER — IPRATROPIUM-ALBUTEROL 0.5-2.5 (3) MG/3ML IN SOLN: 3.0000 mL | RESPIRATORY_TRACT | Status: DC | PRN

## 2016-01-11 MED ORDER — IPRATROPIUM-ALBUTEROL 0.5-2.5 (3) MG/3ML IN SOLN
3.0000 mL | RESPIRATORY_TRACT | Status: DC
  Administered 2016-01-11 – 2016-01-14 (×15): 3 mL via RESPIRATORY_TRACT
  Filled 2016-01-11 (×18): qty 3

## 2016-01-11 MED ORDER — DEXTROSE 5 % IV SOLN
500.0000 mg | INTRAVENOUS | Status: DC
  Administered 2016-01-12 – 2016-01-14 (×3): 500 mg via INTRAVENOUS
  Filled 2016-01-11 (×3): qty 500

## 2016-01-11 MED ORDER — FUROSEMIDE 10 MG/ML IJ SOLN
40.0000 mg | Freq: Two times a day (BID) | INTRAMUSCULAR | Status: DC
  Administered 2016-01-11 – 2016-01-12 (×2): 40 mg via INTRAVENOUS
  Filled 2016-01-11 (×2): qty 4

## 2016-01-11 NOTE — Progress Notes (Signed)
  Echocardiogram 2D Echocardiogram has been performed.  [name] 01/11/2016, 4:49 PM

## 2016-01-11 NOTE — ED Provider Notes (Signed)
CSN: [id number]     Arrival date & time 01/10/16  2115 History   First MD Initiated Contact with Patient 01/10/16 2123     Chief Complaint  Patient presents with  . Respiratory Distress     (Consider location/radiation/quality/duration/timing/severity/associated sxs/prior Treatment) The history is provided by the patient and the EMS personnel.   82 year old male with a history of COPD with acute onset of shortness of breath following a church dinner. His wife took him to the fire station was noted to be hypoxic in respiratory distress he was started on Cipro by them. Given Solu-Medrol magnesium and Atrovent and albuterol. Patient been feeling slightly short of breath for 4 days but got acutely short of breath just this evening. Denies any upper respiratory infection denies any chest pain  Past Medical History  Diagnosis Date  . Dyspnea   . OSA (obstructive sleep apnea)   . HTN (hypertension)   . DM (diabetes mellitus), type 2 ([place])   . Hyperlipidemia   . Systolic congestive heart failure ([place]) 2009    s/p BiV ICD implantation by Dr Leonia Reeves (MDT)  . Atrial fibrillation ([place])     persistent, previously seen at [facility] and placed on amiodarone  . Hypothyroidism   . PNA (pneumonia)   . Cleft palate   . Nasal septal deviation   . Iron deficiency anemia   . GERD (gastroesophageal reflux disease)   . Benign prostatic hypertrophy   . Nephrolithiasis   . Psoriasis   . Seborrheic keratosis   . COPD with emphysema ([place]) 04/01/2010  . CAD (coronary artery disease)     multivessel s/p inferolateral wall MI with subsequent CABG 11/1998.  Cath 2009 with Patent grafts  . Ischemic dilated cardiomyopathy     EF 35-40% by MUGA 6/11  . PAF (paroxysmal atrial fibrillation) ([place])   . Peripheral arterial disease (HCC)     left subclavian artery stenosis  . Stroke ([place])   . Myocardial infarction [facility])    Past Surgical History  Procedure Laterality Date  . Carpal tunnel release    . Left cleft  palate and left cleft lip repair    . Cataract extraction    . C-spine surgery    . Bi-ventricular implantable cardioverter defibrillator  (crt-d)  10-08-08; 11-06-2013    Dr Leonia Reeves (MDT) implant for primary prevention; gen change to MDT VivaXT CRTD by Dr Rayann Heman  . Coronary artery bypass graft      LIMA to LAD, SVG to OM, SVG to diagonal  . Biv icd genertaor change out N/A 11/06/2013    Procedure: BIV ICD [place];  Surgeon: Coralyn Mark, MD;  Location: [facility] CATH LAB;  Service: Cardiovascular;  Laterality: N/A;   Family History  Problem Relation Age of Onset  . Asthma Father   . Stroke Father   . Heart disease Brother    Social History  Substance Use Topics  . Smoking status: Former Smoker -- 3.00 packs/day for 65 years    Types: Cigarettes    Quit date: 10/25/1998  . Smokeless tobacco: Never Used  . Alcohol Use: No     Comment: remote history of heavy alcohol use    Review of Systems  Constitutional: Negative for fever.  HENT: Negative for congestion.   Eyes: Negative for redness.  Respiratory: Positive for shortness of breath and wheezing.   Cardiovascular: Negative for chest pain.  Gastrointestinal: Negative for nausea, vomiting and abdominal pain.  Genitourinary: Negative for dysuria.  Musculoskeletal: Negative for  back pain.  Skin: Negative for rash.  Neurological: Negative for numbness.  Hematological: Does not bruise/bleed easily.  Psychiatric/Behavioral: Negative for confusion.      Allergies  Review of patient's allergies indicates no known allergies.  Home Medications   Prior to Admission medications   Medication Sig Start Date End Date Taking? Authorizing Provider  albuterol (PROVENTIL HFA;VENTOLIN HFA) 108 (90 BASE) MCG/ACT inhaler Inhale 2 puffs into the lungs every 6 (six) hours as needed for wheezing or shortness of breath. Must keep June 2016 appt. 03/04/15  Yes Chesley Mires, MD  albuterol (PROVENTIL) (2.5 MG/3ML) 0.083% nebulizer solution  USE 1 VIAL VIA NEBULIZER 4 TIMES A DAY AS DIRECTED Patient taking differently: USE 1 VIAL VIA NEBULIZER 2 TIMES A DAY AS DIRECTED 08/19/15  Yes Chesley Mires, MD  amiodarone (PACERONE) 200 MG tablet TAKE 1 TABLET BY MOUTH EVERY DAY 11/24/15  Yes Jettie Booze, MD  aspirin 81 MG tablet Take 81 mg by mouth daily.     Yes Historical Provider, MD  atorvastatin (LIPITOR) 80 MG tablet TAKE 1 TABLET (80 MG TOTAL) BY MOUTH DAILY. 10/16/15  Yes Sueanne Margarita, MD  carvedilol (COREG) 6.25 MG tablet TAKE 1 TABLET BY MOUTH TWICE A DAY 10/16/15  Yes Sueanne Margarita, MD  DULERA 100-5 MCG/ACT AERO TAKE 2 PUFFS BY MOUTH TWICE A DAY 12/12/15  Yes Chesley Mires, MD  furosemide (LASIX) 40 MG tablet Take 40 mg by mouth 2 (two) times daily.   Yes Historical Provider, MD  HYDROcodone-acetaminophen (NORCO/VICODIN) 5-325 MG per tablet Take 1 tablet by mouth daily as needed for moderate pain.  09/26/13  Yes Historical Provider, MD  insulin aspart (NOVOLOG) 100 UNIT/ML injection Inject 23 units into the skin before breakfast and lunch, inject 27units into the skin before supper   Yes Historical Provider, MD  insulin glargine (LANTUS SOLOSTAR) 100 UNIT/ML injection Inject 42 Units into the skin 2 (two) times daily.    Yes Historical Provider, MD  levothyroxine (SYNTHROID, LEVOTHROID) 100 MCG tablet Take 100 mcg by mouth daily before breakfast.  09/23/15  Yes Historical Provider, MD  Multiple Vitamin (MULTIVITAMIN) tablet Take 1 tablet by mouth daily.     Yes Historical Provider, MD  omeprazole (PRILOSEC) 20 MG capsule Take 20 mg by mouth daily.     Yes Historical Provider, MD  spironolactone (ALDACTONE) 25 MG tablet TAKE 1/2 TABLET (12.5 MG TOTAL) BY MOUTH DAILY. 06/23/15  Yes Jettie Booze, MD  traMADol (ULTRAM) 50 MG tablet Take 1 tablet by mouth daily as needed (pain).  10/16/14  Yes Historical Provider, MD  valsartan (DIOVAN) 160 MG tablet Take 1 tablet by mouth daily. 10/12/14  Yes Historical Provider, MD  vitamin  B-12 (CYANOCOBALAMIN) 1000 MCG tablet Take 1,000 mcg by mouth daily.   Yes Historical Provider, MD  warfarin (COUMADIN) 5 MG tablet TAKE AS DIRECTED BY COUMADIN CLINIC Patient taking differently: Take 5mg  everyday except on Tuesday and Thursdays take 7.5mg  09/16/15  Yes Traci R Turner, MD  ZETIA 10 MG tablet TAKE 1 TABLET BY MOUTH EVERY DAY 12/18/14  Yes Jettie Booze, MD   BP 118/62 mmHg  Pulse 69  Temp(Src) 98.7 F (37.1 C) (Oral)  Resp 21  Ht 5\' 8"  (1.727 m)  Wt 119.6 kg  BMI 40.10 kg/m2  SpO2 93% Physical Exam  Constitutional: He is oriented to person, place, and time. He appears well-developed and well-nourished. He appears distressed.  HENT:  Head: Normocephalic and atraumatic.  Mouth/Throat: Oropharynx is clear  and moist.  Eyes: EOM are normal. Pupils are equal, round, and reactive to light.  Neck: Normal range of motion. Neck supple.  Cardiovascular: Normal rate, regular rhythm and normal heart sounds.   Pulmonary/Chest: He is in respiratory distress. He has wheezes.  Abdominal: Soft. Bowel sounds are normal. There is no tenderness.  Musculoskeletal: Normal range of motion.  Trace edema bilaterally  Neurological: He is alert and oriented to person, place, and time. No cranial nerve deficit. He exhibits normal muscle tone. Coordination normal.  Skin: Skin is warm. No rash noted.  Nursing note and vitals reviewed.   ED Course  Procedures (including critical care time) Labs Review Labs Reviewed  COMPREHENSIVE METABOLIC PANEL - Abnormal; Notable for the following:    Glucose, Bld 186 (*)    Creatinine, Ser 1.45 (*)    Calcium 8.8 (*)    Total Protein 6.2 (*)    Albumin 3.3 (*)    Total Bilirubin 0.2 (*)    GFR calc non Af Amer 44 (*)    GFR calc Af Amer 51 (*)    All other components within normal limits  BRAIN NATRIURETIC PEPTIDE - Abnormal; Notable for the following:    B Natriuretic Peptide 532.2 (*)    All other components within normal limits  CBC WITH  DIFFERENTIAL/PLATELET - Abnormal; Notable for the following:    RBC 3.89 (*)    Hemoglobin 10.0 (*)    HCT 32.6 (*)    MCH 25.7 (*)    RDW 17.8 (*)    All other components within normal limits  MRSA PCR SCREENING  COMPREHENSIVE METABOLIC PANEL  PROTIME-INR  APTT  TROPONIN I  I-STAT TROPOININ, ED   Results for orders placed or performed during the hospital encounter of 01/10/16  Comprehensive metabolic panel  Result Value Ref Range   Sodium 143 135 - 145 mmol/L   Potassium 4.5 3.5 - 5.1 mmol/L   Chloride 109 101 - 111 mmol/L   CO2 29 22 - 32 mmol/L   Glucose, Bld 186 (H) 65 - 99 mg/dL   BUN 19 6 - 20 mg/dL   Creatinine, Ser 1.45 (H) 0.61 - 1.24 mg/dL   Calcium 8.8 (L) 8.9 - 10.3 mg/dL   Total Protein 6.2 (L) 6.5 - 8.1 g/dL   Albumin 3.3 (L) 3.5 - 5.0 g/dL   AST 33 15 - 41 U/L   ALT 24 17 - 63 U/L   Alkaline Phosphatase 65 38 - 126 U/L   Total Bilirubin 0.2 (L) 0.3 - 1.2 mg/dL   GFR calc non Af Amer 44 (L) >60 mL/min   GFR calc Af Amer 51 (L) >60 mL/min   Anion gap 5 5 - 15  Brain natriuretic peptide  Result Value Ref Range   B Natriuretic Peptide 532.2 (H) 0.0 - 100.0 pg/mL  CBC with Differential/Platelet  Result Value Ref Range   WBC 7.1 4.0 - 10.5 K/uL   RBC 3.89 (L) 4.22 - 5.81 MIL/uL   Hemoglobin 10.0 (L) 13.0 - 17.0 g/dL   HCT 32.6 (L) 39.0 - 52.0 %   MCV 83.8 78.0 - 100.0 fL   MCH 25.7 (L) 26.0 - 34.0 pg   MCHC 30.7 30.0 - 36.0 g/dL   RDW 17.8 (H) 11.5 - 15.5 %   Platelets 195 150 - 400 K/uL   Neutrophils Relative % 64 %   Neutro Abs 4.6 1.7 - 7.7 K/uL   Lymphocytes Relative 25 %   Lymphs Abs 1.8 0.7 -  4.0 K/uL   Monocytes Relative 9 %   Monocytes Absolute 0.6 0.1 - 1.0 K/uL   Eosinophils Relative 2 %   Eosinophils Absolute 0.1 0.0 - 0.7 K/uL   Basophils Relative 0 %   Basophils Absolute 0.0 0.0 - 0.1 K/uL  I-Stat Troponin, ED (not at [facility])  Result Value Ref Range   Troponin i, poc 0.05 0.00 - 0.08 ng/mL   Comment 3             Imaging Review Dg  Chest Port 1 View  01/10/2016  CLINICAL DATA:  RESP DISTRESS, COPD, DM, AFIB EXAM: PORTABLE CHEST 1 VIEW COMPARISON:  01/17/2014, 09/02/2011 FINDINGS: Status post median sternotomy. Left-sided AICD lead overlies the right atrium. The heart is enlarged. There is increased pulmonary opacity bilaterally, right greater than left, consistent with edema and/or infectious process. Lung bases are excluded. IMPRESSION: 1. Cardiomegaly. 2. Suspect interstitial edema. Possible superimposed infectious infiltrate in the right lung. Electronically Signed   By: Nolon Nations M.D.   On: 01/10/2016 22:16   I have personally reviewed and evaluated these images and lab results as part of my medical decision-making.   EKG Interpretation   Date/Time:  Saturday January 10 2016 21:20:37 EDT Ventricular Rate:  71 PR Interval:  155 QRS Duration: 133 QT Interval:  465 QTC Calculation: 505 R Axis:   -86 Text Interpretation:  Atrial-ventricular dual-paced rhythm No further  analysis attempted due to paced rhythm Confirmed by Shelba Susi  MD, Everett Ricciardelli  [phone]) on 01/10/2016 9:23:39 PM       CRITICAL CARE Performed by: Fredia Sorrow Total critical care time: 30 minutes Critical care time was exclusive of separately billable procedures and treating other patients. Critical care was necessary to treat or prevent imminent or life-threatening deterioration. Critical care was time spent personally by me on the following activities: development of treatment plan with patient and/or surrogate as well as nursing, discussions with consultants, evaluation of patient's response to treatment, examination of patient, obtaining history from patient or surrogate, ordering and performing treatments and interventions, ordering and review of laboratory studies, ordering and review of radiographic studies, pulse oximetry and re-evaluation of patient's condition.    MDM   Final diagnoses:  COPD exacerbation ([place])    Patient was  sudden onset of acute respiratory distress while at a church dinner. Patient was taken to the fire station was showing low sats started on Cipro Also given Solu-Medrol 2 g of magnesium and given albuterol Atrovent nebulizer in route. Patient still wheezing upon arrival but overall feeling better. Patient has a history of COPD. Patient uses C Pap at night for sleep apnea but otherwise not normally on oxygen. Denies any chest pain. Denies having any of upper respiratory type infection. Patient was continued on BiPAP here. Given another albuterol Atrovent treatment. Gradually showed signs of improvement in wheezing resolved. Patient felt much better on the BiPAP. Chest x-ray raises some question of possible infiltrate right lung. Patient without a leukocytosis no fever. Patient will be admitted for the exacerbation of COPD and the requirement for BiPAP. Going to step down unit.    Fredia Sorrow, MD 01/11/16 [phone]

## 2016-01-11 NOTE — Progress Notes (Signed)
Patient ID: [name], male   DOB: [birth year], 82 y.o.   MRN: [id number]   [facility] PROGRESS NOTE  DOUGLES TILLERSON [id number] DOB: 04/27/1934 DOA: 02-08-2016 PCP: Irven Shelling, MD   Brief narrative:    82 yo male with history of COPD on CPAP, left subclavian stenosis, CHF (systolic, EF [id number] in [id number]), Afib s/p biventricular defibrillator, CAD s/p CABG, hypothyroidism, HTN, CVA who presented to [facility] ED with main concern of several days duration of progressively worsening dyspnea that initially started with exertion and has progressed to dyspnea at rest, associated with LE swelling, 2-3 pillow orthopnea, wheezing, mixed non productive and productive cough of clear sputum, poor oral intake and malaise. No reported fevers, chills, abd or urinary concerns.    Assessment/Plan:    Active Problems:   Acute respiratory failure with hypoxia (HCC) / Acute COPD / RLL PNA unknown pathogen  - multifactorial and secondary to RLL PNA, CAP, Acute COPD, Acute on chronic systolic CHF - placed on Lasix for diuresis - empiric ABX for ? PNA - BD's scheduled and as needed with prednisone tapering  - sputum analysis pending     Acute on chronic systolic CHF (congestive heart failure), NYHA class 3 (HCC) - weight 119 kg on admission - place on Lasix 40 mg IV BID - monitor clinical response     Acute renal failure superimposed on stage 3 chronic kidney disease ([place]) - with Cr now up from last normal baseline - monitor closely - I/O, daily weights - BMP in AM    Peripheral vascular disease ([place]) - continue statin, DM and HTN control     PAF (paroxysmal atrial fibrillation) ([place]), chronic, CHADS2 score 5-6 - rate controlled  - continue Coreg and Amiodarone - contieu Coumadin per pharmacy     Essential hypertension - reasonable inpatient control     OSA (obstructive sleep apnea)    Demand ischemia (HCC) - trop mildly elevated, secondary to acute illness outlined above - no  chest pain this AM    Diabetes mellitus with diabetic neuropathy, with long-term current use of insulin ([place])    Morbid obesity due to excess calories (HCC) - Body mass index is 40.1 kg/(m^2).  DVT prophylaxis - on Coumadin   Code Status: Full.  Family Communication:  plan of care discussed with the patient Disposition Plan: Home by 3/22  IV access:  Peripheral IV  Procedures and diagnostic studies:    Dg Chest Port 1 View 2016/02/08 Cardiomegaly. 2. Suspect interstitial edema. Possible superimposed infectious infiltrate in the right lung.  Medical Consultants:  None  Other Consultants:  PT  IAnti-Infectives:   Zithromax 08-Feb-2023 --> Rocephin 3/19 -->  [name], MD  [facility] Pager [phone]  If 7PM-7AM, please contact night-coverage [email or web address] Password [email or web address] 01/11/2016, 2:58 PM   LOS: 1 day   HPI/Subjective: No events overnight.   Objective: Filed Vitals:   01/11/16 0730 01/11/16 0800 01/11/16 1137 01/11/16 1333  BP: 161/80 155/97  128/69  Pulse:      Temp:    98.1 F (36.7 C)  TempSrc:      Resp: 22 24  24   Height:      Weight:      SpO2: 96% 96% 95% 92%    Intake/Output Summary (Last 24 hours) at 01/11/16 1458 Last data filed at 01/11/16 1433  Gross per 24 hour  Intake    530 ml  Output   1100 ml  Net   -570 ml    Exam:  General:  Pt is alert, follows commands appropriately, not in acute distress  Cardiovascular: Regular rate and rhythm, no rubs, no gallops  Respiratory: Rales in both lobes, mild exp wheezing   Abdomen: Soft, non tender, non distended, bowel sounds present, no guarding  Extremities: +2 pitting LE edema, pulses DP and PT palpable bilaterally  Neuro: Grossly nonfocal  Data Reviewed: Basic Metabolic Panel:  Recent Labs Lab 01/10/16 2131 01/11/16 0430  NA 143 142  K 4.5 4.9  CL 109 102  CO2 29 30  GLUCOSE 186* 261*  BUN 19 21*  CREATININE 1.45* 1.43*  CALCIUM 8.8* 8.8*   Liver Function Tests:  Recent  Labs Lab 01/10/16 2131 01/11/16 0430  AST 33 29  ALT 24 25  ALKPHOS 65 70  BILITOT 0.2* 0.4  PROT 6.2* 6.5  ALBUMIN 3.3* 3.3*   No results for input(s): LIPASE, AMYLASE in the last 168 hours. No results for input(s): AMMONIA in the last 168 hours. CBC:  Recent Labs Lab 01/10/16 2131  WBC 7.1  NEUTROABS 4.6  HGB 10.0*  HCT 32.6*  MCV 83.8  PLT 195   Cardiac Enzymes:  Recent Labs Lab 01/11/16 0430  TROPONINI 0.04*   BNP: Invalid input(s): POCBNP CBG:  Recent Labs Lab 01/11/16 0848 01/11/16 1332  GLUCAP 266* 286*    Recent Results (from the past 240 hour(s))  MRSA PCR Screening     Status: None   Collection Time: 01/11/16 12:10 AM  Result Value Ref Range Status   MRSA by PCR NEGATIVE NEGATIVE Final    Comment:        The GeneXpert MRSA Assay (FDA approved for NASAL specimens only), is one component of a comprehensive MRSA colonization surveillance program. It is not intended to diagnose MRSA infection nor to guide or monitor treatment for MRSA infections.      Scheduled Meds: . amiodarone  200 mg Oral Daily  . aspirin EC  81 mg Oral Daily  . atorvastatin  80 mg Oral q1800  . [START ON 01/12/2016] azithromycin  500 mg Intravenous Q24H  . carvedilol  6.25 mg Oral BID  . cefTRIAXone (ROCEPHIN)  IV  1 g Intravenous Q24H  . ezetimibe  10 mg Oral Daily  . furosemide  40 mg Intravenous BID  . insulin aspart  0-9 Units Subcutaneous TID WC  . insulin aspart  23 Units Subcutaneous TID WC  . insulin glargine  42 Units Subcutaneous BID  . ipratropium-albuterol  3 mL Nebulization Q4H  . irbesartan  150 mg Oral Daily  . levothyroxine  100 mcg Oral QAC breakfast  . mometasone-formoterol  2 puff Inhalation BID  . multivitamin with minerals  1 tablet Oral Daily  . pantoprazole  40 mg Oral Daily  . predniSONE  50 mg Oral Q breakfast  . spironolactone  12.5 mg Oral Daily  . vitamin B-12  1,000 mcg Oral Daily  . warfarin  5 mg Oral q1800  . Warfarin -  Physician Dosing Inpatient   Oral q1800   Continuous Infusions:

## 2016-01-11 NOTE — Progress Notes (Signed)
RT placed pt on CPAP with 4l bleed in so he could take nap. No complications. Vital signs stable at this time. Wife and RN at bedside. RT will continue to monitor.

## 2016-01-12 DIAGNOSIS — N183 Chronic kidney disease, stage 3 (moderate): Secondary | ICD-10-CM

## 2016-01-12 DIAGNOSIS — I739 Peripheral vascular disease, unspecified: Secondary | ICD-10-CM

## 2016-01-12 DIAGNOSIS — I248 Other forms of acute ischemic heart disease: Secondary | ICD-10-CM

## 2016-01-12 DIAGNOSIS — N179 Acute kidney failure, unspecified: Secondary | ICD-10-CM

## 2016-01-12 DIAGNOSIS — J441 Chronic obstructive pulmonary disease with (acute) exacerbation: Principal | ICD-10-CM

## 2016-01-12 DIAGNOSIS — Z7901 Long term (current) use of anticoagulants: Secondary | ICD-10-CM

## 2016-01-12 DIAGNOSIS — J9601 Acute respiratory failure with hypoxia: Secondary | ICD-10-CM

## 2016-01-12 LAB — CBC
HCT: 33.3 % — ABNORMAL LOW (ref 39.0–52.0)
Hemoglobin: 10.1 g/dL — ABNORMAL LOW (ref 13.0–17.0)
MCH: 25.4 pg — ABNORMAL LOW (ref 26.0–34.0)
MCHC: 30.3 g/dL (ref 30.0–36.0)
MCV: 83.9 fL (ref 78.0–100.0)
Platelets: 194 10*3/uL (ref 150–400)
RBC: 3.97 MIL/uL — ABNORMAL LOW (ref 4.22–5.81)
RDW: 17.7 % — ABNORMAL HIGH (ref 11.5–15.5)
WBC: 12.5 10*3/uL — ABNORMAL HIGH (ref 4.0–10.5)

## 2016-01-12 LAB — BASIC METABOLIC PANEL
Anion gap: 9 (ref 5–15)
BUN: 38 mg/dL — ABNORMAL HIGH (ref 6–20)
CO2: 28 mmol/L (ref 22–32)
Calcium: 8.4 mg/dL — ABNORMAL LOW (ref 8.9–10.3)
Chloride: 100 mmol/L — ABNORMAL LOW (ref 101–111)
Creatinine, Ser: 1.72 mg/dL — ABNORMAL HIGH (ref 0.61–1.24)
GFR calc Af Amer: 41 mL/min — ABNORMAL LOW (ref >60)
GFR calc non Af Amer: 36 mL/min — ABNORMAL LOW (ref >60)
Glucose, Bld: 349 mg/dL — ABNORMAL HIGH (ref 65–99)
Potassium: 4.7 mmol/L (ref 3.5–5.1)
Sodium: 137 mmol/L (ref 135–145)

## 2016-01-12 LAB — PROTIME-INR
INR: 2.45 — ABNORMAL HIGH (ref 0.00–1.49)
Prothrombin Time: 26.3 seconds — ABNORMAL HIGH (ref 11.6–15.2)

## 2016-01-12 LAB — GLUCOSE, CAPILLARY
Glucose-Capillary: 322 mg/dL — ABNORMAL HIGH (ref 65–99)
Glucose-Capillary: 276 mg/dL — ABNORMAL HIGH (ref 65–99)
Glucose-Capillary: 210 mg/dL — ABNORMAL HIGH (ref 65–99)
Glucose-Capillary: 282 mg/dL — ABNORMAL HIGH (ref 65–99)
Glucose-Capillary: 289 mg/dL — ABNORMAL HIGH (ref 65–99)

## 2016-01-12 LAB — STREP PNEUMONIAE URINARY ANTIGEN: Strep Pneumo Urinary Antigen: NEGATIVE

## 2016-01-12 MED ORDER — DOCUSATE SODIUM 100 MG PO CAPS
100.0000 mg | ORAL_CAPSULE | Freq: Two times a day (BID) | ORAL | Status: DC
  Administered 2016-01-12 – 2016-01-13 (×2): 100 mg via ORAL
  Filled 2016-01-12 (×2): qty 1

## 2016-01-12 MED ORDER — DOCUSATE SODIUM 100 MG PO CAPS: 100.0000 mg | ORAL_CAPSULE | Freq: Two times a day (BID) | ORAL | Status: DC

## 2016-01-12 MED ORDER — FUROSEMIDE 10 MG/ML IJ SOLN: 20.0000 mg | Freq: Two times a day (BID) | INTRAMUSCULAR | Status: DC

## 2016-01-12 MED ORDER — PREDNISONE 20 MG PO TABS: 40.0000 mg | ORAL_TABLET | Freq: Every day | ORAL | Status: DC

## 2016-01-12 MED ORDER — METHYLPREDNISOLONE SODIUM SUCC 40 MG IJ SOLR
40.0000 mg | Freq: Four times a day (QID) | INTRAMUSCULAR | Status: DC
  Administered 2016-01-12 – 2016-01-14 (×9): 40 mg via INTRAVENOUS
  Filled 2016-01-12 (×9): qty 1

## 2016-01-12 NOTE — Progress Notes (Addendum)
Patient ID: [name], male   DOB: [birth year], 82 y.o.   MRN: [id number]   [facility] PROGRESS NOTE  [name] [id number] DOB: [birth year] DOA: 2016-01-23 PCP: Irven Shelling, MD   Brief narrative:    82 yo male with history of COPD on CPAP, left subclavian stenosis, CHF (systolic, EF [id number] in [id number]), Afib s/p biventricular defibrillator, CAD s/p CABG, hypothyroidism, HTN, CVA who presented to [name] ED with main concern of several days duration of progressively worsening dyspnea that initially started with exertion and has progressed to dyspnea at rest, associated with LE swelling, 2-3 pillow orthopnea, wheezing, mixed non productive and productive cough of clear sputum, poor oral intake and malaise. No reported fevers, chills, abd or urinary concerns.    Assessment/Plan:    Active Problems:   Acute respiratory failure with hypoxia (HCC) / Acute COPD / RLL PNA unknown pathogen  - multifactorial and secondary to RLL PNA, CAP, Acute COPD, Acute on chronic systolic CHF - placed on Lasix for diuresis, weight down from 270 lbs on admission to 261 lbs this AM - cardiology consulted for further assistance - continue empiric ABX for ? PNA - BD's scheduled and as needed with prednisone tapering  - sputum analysis still pending     Acute on chronic systolic CHF (congestive heart failure), NYHA class 3 (HCC) - weight 270 lbs on admission --> 261 lbs this AM  - lower the dose of Lasix 40 mg BID to 20 mg IV BID if cardiology agrees  - monitor clinical response, strict I/O, daily weights     Acute renal failure superimposed on stage 3 chronic kidney disease ([name]) - with Cr now up from last normal baseline - monitor closely  - I/O, daily weights - BMP in AM    Acute encephalopathy, hospital delirium night of 3/19 - resolved     Peripheral vascular disease ([name]) - continue statin, DM and HTN control     PAF (paroxysmal atrial fibrillation) ([place]), chronic, CHADS2 score  5-6 - rate controlled  - continue Coreg and Amiodarone - continue Coumadin per pharmacy     Essential hypertension - SBP in 90's this AM - currently on Lasix, Avapro, Coreg, Spironolactone  - will hold Avapro as Cr also up     OSA (obstructive sleep apnea)    Demand ischemia (HCC) - trop mildly elevated, secondary to acute illness outlined above - no chest pain this AM    Diabetes mellitus with diabetic neuropathy, with long-term current use of insulin ([name]) - continue with home medical regimen     Morbid obesity due to excess calories (HCC) - Body mass index is 39.83 kg/(m^2).  DVT prophylaxis - on Coumadin   Code Status: Full.  Family Communication:  plan of care discussed with the patient Disposition Plan: Home by 3/23  IV access:  Peripheral IV  Procedures and diagnostic studies:    Dg Chest Port 1 View January 23, 2016 Cardiomegaly. 2. Suspect interstitial edema. Possible superimposed infectious infiltrate in the right lung.  Medical Consultants:  Cardiology   Other Consultants:  PT  IAnti-Infectives:   Zithromax 01-23-23 --> Rocephin 3/19 -->  [name], MD  [facility] Pager [phone]  If 7PM-7AM, please contact night-coverage [email or web address] Password [email or web address] 01/12/2016, 2:17 PM   LOS: 2 days   HPI/Subjective: Confusion overnight.   Objective: Filed Vitals:   01/12/16 0800 01/12/16 0919 01/12/16 1200 01/12/16 1206  BP: 122/62  90/47   Pulse:   76   Temp: 97.8 F (36.6  C)  97.9 F (36.6 C)   TempSrc: Oral  Oral   Resp: 25     Height:      Weight:      SpO2: 94% 96% 96% 95%    Intake/Output Summary (Last 24 hours) at 01/12/16 1417 Last data filed at 01/12/16 0416  Gross per 24 hour  Intake    480 ml  Output   1675 ml  Net  -1195 ml    Exam:   General:  Pt is alert, follows commands appropriately, not in acute distress  Cardiovascular: Regular rate and rhythm, no rubs, no gallops  Respiratory: course breath sounds with exp wheezing,  diminished breath sounds at bases   Abdomen: Soft, non tender, non distended, bowel sounds present, no guarding  Extremities: +2 pitting LE edema, pulses DP and PT palpable bilaterally  Neuro: Grossly nonfocal  Data Reviewed: Basic Metabolic Panel:  Recent Labs Lab 01/10/16 2131 01/11/16 0430 01/12/16 0445  NA 143 142 137  K 4.5 4.9 4.7  CL 109 102 100*  CO2 29 30 28   GLUCOSE 186* 261* 349*  BUN 19 21* 38*  CREATININE 1.45* 1.43* 1.72*  CALCIUM 8.8* 8.8* 8.4*   Liver Function Tests:  Recent Labs Lab 01/10/16 2131 01/11/16 0430  AST 33 29  ALT 24 25  ALKPHOS 65 70  BILITOT 0.2* 0.4  PROT 6.2* 6.5  ALBUMIN 3.3* 3.3*   No results for input(s): LIPASE, AMYLASE in the last 168 hours. No results for input(s): AMMONIA in the last 168 hours. CBC:  Recent Labs Lab 01/10/16 2131 01/12/16 0445  WBC 7.1 12.5*  NEUTROABS 4.6  --   HGB 10.0* 10.1*  HCT 32.6* 33.3*  MCV 83.8 83.9  PLT 195 194   Cardiac Enzymes:  Recent Labs Lab 01/11/16 0430  TROPONINI 0.04*   CBG:  Recent Labs Lab 01/11/16 0848 01/11/16 1332 01/11/16 1612 01/12/16 0738 01/12/16 1226  GLUCAP 266* 286* 346* 276* 210*   Recent Results (from the past 240 hour(s))  MRSA PCR Screening     Status: None   Collection Time: 01/11/16 12:10 AM  Result Value Ref Range Status   MRSA by PCR NEGATIVE NEGATIVE Final    Scheduled Meds: . amiodarone  200 mg Oral Daily  . aspirin EC  81 mg Oral Daily  . atorvastatin  80 mg Oral q1800  . azithromycin  500 mg Intravenous Q24H  . carvedilol  6.25 mg Oral BID  . cefTRIAXone (ROCEPHIN)  IV  1 g Intravenous Q24H  . ezetimibe  10 mg Oral Daily  . furosemide  40 mg Intravenous BID  . insulin aspart  0-9 Units Subcutaneous TID WC  . insulin aspart  23 Units Subcutaneous TID WC  . insulin glargine  42 Units Subcutaneous BID  . ipratropium-albuterol  3 mL Nebulization Q4H  . irbesartan  150 mg Oral Daily  . levothyroxine  100 mcg Oral QAC breakfast  .  mometasone-formoterol  2 puff Inhalation BID  . multivitamin with minerals  1 tablet Oral Daily  . pantoprazole  40 mg Oral Daily  . predniSONE  50 mg Oral Q breakfast  . spironolactone  12.5 mg Oral Daily  . vitamin B-12  1,000 mcg Oral Daily  . warfarin  5 mg Oral q1800  . Warfarin - Physician Dosing Inpatient   Oral q1800   Continuous Infusions:

## 2016-01-12 NOTE — Evaluation (Signed)
Physical Therapy Evaluation Patient Details Name: [name] MRN: [id number] DOB: 09/26/1934 Today's Date: 01/12/2016   History of Present Illness  82 yo male with history of COPD on CPAP, left subclavian stenosis, CHF (systolic, EF [id number] in [id number]), Afib s/p biventricular defibrillator, CAD s/p CABG, hypothyroidism, HTN, CVA who presented to [facility] ED with main concern of several days duration of progressively worsening dyspnea that initially started with exertion and has progressed to dyspnea at rest, associated with LE swelling, 2-3 pillow orthopnea, wheezing, mixed non productive and productive cough of clear sputum, poor oral intake and malaise.  Clinical Impression  Pt admitted with above diagnosis. Pt currently with functional limitations due to the deficits listed below (see PT Problem List). Pt ambulated 300' with rollator and and min-guard A on 4L O2 and sats remained stable, will work towards weaning off O2 with activity as pt did not use it at home.  Pt will benefit from skilled PT to increase their independence and safety with mobility to allow discharge to the venue listed below.       Follow Up Recommendations No PT follow up    Equipment Recommendations  None recommended by PT    Recommendations for Other Services       Precautions / Restrictions Precautions Precautions: Fall Precaution Comments: watch O2 sats Restrictions Weight Bearing Restrictions: No      Mobility  Bed Mobility Overal bed mobility: Modified Independent             General bed mobility comments: pt able to get to EOB with increased time and effort  Transfers Overall transfer level: Needs assistance Equipment used: 4-wheeled walker Transfers: Sit to/from Stand Sit to Stand: Supervision         General transfer comment: pt stood safely to rollator without physical assist  Ambulation/Gait Ambulation/Gait assistance: Min guard Ambulation Distance (Feet): 300 Feet Assistive device:  4-wheeled walker Gait Pattern/deviations: Step-through pattern Gait velocity: WFL Gait velocity interpretation: at or above normal speed for age/gender General Gait Details: pt with quick gait speed and tolerated ambulation well on 4L O2, pt reports he feels that he would have fatigued quickly without the O2. Somewhat impulsive, vc's for safety  Stairs            Wheelchair Mobility    Modified Rankin (Stroke Patients Only)       Balance Overall balance assessment: No apparent balance deficits (not formally assessed)                                           Pertinent Vitals/Pain Pain Assessment: No/denies pain    Home Living Family/patient expects to be discharged to:: Private residence Living Arrangements: Spouse/significant other Available Help at Discharge: Family;Available 24 hours/day Type of Home: House Home Access: Stairs to enter Entrance Stairs-Rails: Right Entrance Stairs-Number of Steps: 3 Home Layout: One level Home Equipment: Cane - single point;Walker - 4 wheels Additional Comments: pt uses rollator occasionally, uses cane out of house. Was not on O2 at home    Prior Function Level of Independence: Independent with assistive device(s)               Hand Dominance        Extremity/Trunk Assessment   Upper Extremity Assessment: Overall WFL for tasks assessed           Lower Extremity Assessment: Overall WFL for tasks  assessed      Cervical / Trunk Assessment: Normal  Communication   Communication: No difficulties  Cognition Arousal/Alertness: Awake/alert Behavior During Therapy: WFL for tasks assessed/performed Overall Cognitive Status: Within Functional Limits for tasks assessed                      General Comments General comments (skin integrity, edema, etc.): O2 sats 94% on 4L O2 after ambulation, HR 98 bpm    Exercises        Assessment/Plan    PT Assessment Patient needs continued PT  services  PT Diagnosis Difficulty walking   PT Problem List Cardiopulmonary status limiting activity;Decreased activity tolerance;Decreased mobility;Decreased safety awareness  PT Treatment Interventions DME instruction;Gait training;Stair training;Functional mobility training;Therapeutic activities;Therapeutic exercise;Patient/family education   PT Goals (Current goals can be found in the Care Plan section) Acute Rehab PT Goals Patient Stated Goal: return home PT Goal Formulation: With patient Time For Goal Achievement: 01/26/16 Potential to Achieve Goals: Good    Frequency Min 3X/week   Barriers to discharge        Co-evaluation               End of Session Equipment Utilized During Treatment: Gait belt;Oxygen Activity Tolerance: Patient tolerated treatment well Patient left: in chair;with call bell/phone within reach;with family/visitor present Nurse Communication: Mobility status         Time: [id number] PT Time Calculation (min) (ACUTE ONLY): 28 min   Charges:   PT Evaluation $PT Eval Moderate Complexity: 1 Procedure PT Treatments $Gait Training: 8-22 mins   PT G Codes:       Leighton Roach, PT  Acute Rehab Services  [phone]  Leighton Roach 01/12/2016, 12:25 PM

## 2016-01-12 NOTE — Consult Note (Signed)
CARDIOLOGY CONSULT NOTE   Patient ID: [name] MRN: [id number], DOB/AGE: 02/05/1934   Admit date: 01/10/2016 Date of Consult: 01/12/2016   Primary Physician: [name] Shelling, MD Primary Cardiologist: Dr. Irish Ramirez Electrophysiologist: Dr. Rayann Ramirez Pulmonologist: Dr. Halford Ramirez  Pt. Profile  [name] is 82 Caucasian male with COPD, history of left subclavian stenosis, HTN, DM, OSA on CPAP, hypothyroidism, history of atrial fibrillation on Coumadin, CAD s/p CABG 2000, ICM with EF 40% and s/p Medtronic BiV ICD came in with acute on chronic resp failure.   Problem List  Past Medical History  Diagnosis Date  . Dyspnea   . OSA (obstructive sleep apnea)   . HTN (hypertension)   . DM (diabetes mellitus), type 2 ([place])   . Hyperlipidemia   . Systolic congestive heart failure ([place]) 2009    s/p BiV ICD implantation by Dr [name] (MDT)  . Atrial fibrillation ([place])     persistent, previously seen at [facility] and placed on amiodarone  . Hypothyroidism   . PNA (pneumonia)   . Cleft palate   . Nasal septal deviation   . Iron deficiency anemia   . GERD (gastroesophageal reflux disease)   . Benign prostatic hypertrophy   . Nephrolithiasis   . Psoriasis   . Seborrheic keratosis   . COPD with emphysema ([place]) 04/01/2010  . CAD (coronary artery disease)     multivessel s/p inferolateral wall MI with subsequent CABG 11/1998.  Cath 2009 with Patent grafts  . Ischemic dilated cardiomyopathy     EF 35-40% by MUGA 6/11  . PAF (paroxysmal atrial fibrillation) ([place])   . Peripheral arterial disease (HCC)     left subclavian artery stenosis  . Stroke ([place])   . Myocardial infarction [facility])     Past Surgical History  Procedure Laterality Date  . Carpal tunnel release    . Left cleft palate and left cleft lip repair    . Cataract extraction    . C-spine surgery    . Bi-ventricular implantable cardioverter defibrillator  (crt-d)  10-08-08; 11-06-2013    Dr [name] (MDT) implant for  primary prevention; gen change to MDT VivaXT CRTD by Dr [name]  . Coronary artery bypass graft      LIMA to LAD, SVG to OM, SVG to diagonal  . Biv icd genertaor change out N/A 11/06/2013    Procedure: BIV ICD [place];  Surgeon: [name] Mark, MD;  Location: [facility] CATH LAB;  Service: Cardiovascular;  Laterality: N/A;     Allergies  No Known Allergies  HPI   [name] is 82 Caucasian male with COPD, history of left subclavian stenosis, HTN, DM, OSA on CPAP, hypothyroidism, history of atrial fibrillation on Coumadin, CAD s/p CABG 2000, ICM with EF 40% and s/p Medtronic BiV ICD. His last cardiac catheterization was on 09/06/2008 which shows occluded left main which is heavily calcified extending into the LAD and circumflex, SVG to OM widely patent, SVG to diagonal widely patent, LIMA to distal LAD, patent RCA, EF 20%. Although he does have a history of left subclavian stenosis, no intervention was recommended due to potential compromise of LIMA to LAD. He underwent Medtronic BiV ICD placement ischemic cardiomyopathy in January 2015. Despite having chronic shortness of breath for more than a year, he is not on any home oxygen. He is compliant with his CPAP machine at nighttime. He denies any recent chest pain, then again, he never had any chest pain with the optional heart attack either. For  the last 1-2 months, he has been noticing increasing lower extremity edema and abdominal girth. Last weekend around Saturday 01/10/2016, he was getting severely short of breath even at rest. He states he has been compliant with his home Lasix dose. He did not attempt to take an additional dose of Lasix. He likes to eat salty food and drink plenty of water per day. He eventually sought medical attention at [facility] on the same day.  He was admitted to internal medicine service. Initial laboratory finding shows trending 1.45, BNP 532, mildly elevated troponin of 0.04. Hemoglobin 10.0. Chest  x-ray suspect interstitial edema, with possible superimposed infectious infiltrate in the right lung. Echocardiogram obtained on 01/11/2016 showed EF 123456, grade 2 diastolic dysfunction, moderately to severely dilated left atrium, PA peak pressure 23. He was given 120 mg IV Lasix yesterday and additional 40 mg IV Lasix this morning. His creatinine increased from 1.4 to 1.7 after IV diuresis. Cardiology has been consulted for acute on chronic combined systolic and diastolic heart failure.   Inpatient Medications  . amiodarone  200 mg Oral Daily  . aspirin EC  81 mg Oral Daily  . atorvastatin  80 mg Oral q1800  . azithromycin  500 mg Intravenous Q24H  . carvedilol  6.25 mg Oral BID  . cefTRIAXone (ROCEPHIN)  IV  1 g Intravenous Q24H  . ezetimibe  10 mg Oral Daily  . furosemide  40 mg Intravenous BID  . insulin aspart  0-9 Units Subcutaneous TID WC  . insulin aspart  23 Units Subcutaneous TID WC  . insulin glargine  42 Units Subcutaneous BID  . ipratropium-albuterol  3 mL Nebulization Q4H  . irbesartan  150 mg Oral Daily  . levothyroxine  100 mcg Oral QAC breakfast  . mometasone-formoterol  2 puff Inhalation BID  . multivitamin with minerals  1 tablet Oral Daily  . pantoprazole  40 mg Oral Daily  . predniSONE  50 mg Oral Q breakfast  . spironolactone  12.5 mg Oral Daily  . vitamin B-12  1,000 mcg Oral Daily  . warfarin  5 mg Oral q1800  . Warfarin - Physician Dosing Inpatient   Oral [phone]    Family History Family History  Problem Relation Age of Onset  . Asthma Father   . Stroke Father   . Heart disease Brother      Social History Social History   Social History  . Marital Status: Married    Spouse Name: N/A  . Number of Children: N/A  . Years of Education: N/A   Occupational History  . retired     Engineer, agricultural   Social History Main Topics  . Smoking status: Former Smoker -- 3.00 packs/day for 65 years    Types: Cigarettes    Quit date: 10/25/1998  . Smokeless  tobacco: Never Used  . Alcohol Use: No     Comment: remote history of heavy alcohol use  . Drug Use: No  . Sexual Activity: Not on file   Other Topics Concern  . Not on file   Social History Narrative   Lives [employer]   Retired     Review of Systems  General:  No chills, fever, night sweats or weight changes.  Cardiovascular:  No chest pain, palpitations. +dyspnea on exertion, edema Dermatological: No rash, lesions/masses Respiratory: +clear productive cough, dyspnea (acute on chronic) Urologic: No hematuria, dysuria Abdominal:   No nausea, vomiting, diarrhea, bright red blood per rectum, melena, or hematemesis +increase in abdominal  girth Neurologic:  No visual changes, wkns, changes in mental status. All other systems reviewed and are otherwise negative except as noted above.  Physical Exam  Blood pressure 90/47, pulse 76, temperature 97.9 F (36.6 C), temperature source Oral, resp. rate 25, height 5\' 8"  (1.727 m), weight 261 lb 14.5 oz (118.8 kg), SpO2 95 %.  General: Pleasant, NAD Psych: Normal affect. Neuro: Alert and oriented X 3. Moves all extremities spontaneously. HEENT: Normal  Neck: Supple without bruits or JVD. Lungs:  Resp regular and unlabored. Mild bibasilar rale, prominent expiratory coarse wheezing and rhonchi Heart: RRR no s3, s4, or murmurs. Abdomen: Soft, non-tender, BS + x 4.  +significantly distended abdomen Extremities: No clubbing, cyanosis. DP/PT/Radials 2+ and equal bilaterally. 1+ pitting edema  Labs   Recent Labs  01/11/16 0430  TROPONINI 0.04*   Lab Results  Component Value Date   WBC 12.5* 01/12/2016   HGB 10.1* 01/12/2016   HCT 33.3* 01/12/2016   MCV 83.9 01/12/2016   PLT 194 01/12/2016    Recent Labs Lab 01/11/16 0430 01/12/16 0445  NA 142 137  K 4.9 4.7  CL 102 100*  CO2 30 28  BUN 21* 38*  CREATININE 1.43* 1.72*  CALCIUM 8.8* 8.4*  PROT 6.5  --   BILITOT 0.4  --   ALKPHOS 70  --   ALT 25  --   AST 29  --     GLUCOSE 261* 349*   Lab Results  Component Value Date   CHOL 137 11/12/2014   HDL 50 11/12/2014   LDLCALC 68 11/12/2014   TRIG 95 11/12/2014   Lab Results  Component Value Date   DDIMER * 11/28/2008    1.04        AT THE INHOUSE ESTABLISHED CUTOFF VALUE OF 0.48 ug/mL FEU, THIS ASSAY HAS BEEN DOCUMENTED IN THE LITERATURE TO HAVE A SENSITIVITY AND NEGATIVE PREDICTIVE VALUE OF AT LEAST 98 TO 99%.  THE TEST RESULT SHOULD BE CORRELATED WITH AN ASSESSMENT OF THE CLINICAL PROBABILITY OF DVT / VTE.    Radiology/Studies  Dg Chest Port 1 View  01/10/2016  CLINICAL DATA:  RESP DISTRESS, COPD, DM, AFIB EXAM: PORTABLE CHEST 1 VIEW COMPARISON:  01/17/2014, 09/02/2011 FINDINGS: Status post median sternotomy. Left-sided AICD lead overlies the right atrium. The heart is enlarged. There is increased pulmonary opacity bilaterally, right greater than left, consistent with edema and/or infectious process. Lung bases are excluded. IMPRESSION: 1. Cardiomegaly. 2. Suspect interstitial edema. Possible superimposed infectious infiltrate in the right lung. Electronically Signed   By: Nolon Nations M.D.   On: 01/10/2016 22:16    ECG  Paced rhythm  ASSESSMENT AND PLAN  1. COPD exacerbation  - significant rhonchi and expiratory wheezing on exam consistent with severe COPD with possible exacerbation  2. Acute on chronic combined systolic and diastolic heart failure  - mildly fluid overloaded with 1+ pitting edema in the LE, Cr did increase after 120mg  IV lasix yesterday, will continue IV lasix for now to see respond and trend Cr. May need to transition to PO lasix if Cr continue to go up.  - discussed Salt and fluid restriction, neither of which he was compliant with before but understand he will have to now.  3. CAD s/p CABG 2000  - last cath 2009, no significant change to EF on echo, will hold off on ischemic workup  4. history of atrial fibrillation on Coumadin  - on amiodarone, no obvious  finding to suggest amiodarone induced pulm pneumonitis  5. history of left subclavian stenosis  6. ICM with EF 40% and s/p BiV ICD  7. HTN 8. DM 9. OSA on CPAP 10. hypothyroidism  Signed, Almyra Deforest, PA-C 01/12/2016, 2:00 PM   I have examined the patient and reviewed assessment and plan and discussed with patient.  Agree with above as stated.  Watch renal function. Discontinue Lasix for now until renal function is resulted in AM.  I think this is more COPD than it is CHF.  His LVEF in 2011 was 20-25%.  It has increased to the 40% range with medical therapy.  The focal wall motion abnormalities are not new.  No angina.  Known left subclavian stenosis.  He saw Dr. Gwenlyn Found for this.  No plans for ischemia testing at this time.   Adie Vilar S.

## 2016-01-12 NOTE — Discharge Instructions (Signed)

## 2016-01-12 NOTE — Progress Notes (Signed)
Inpatient Diabetes Program Recommendations  AACE/ADA: New Consensus Statement on Inpatient Glycemic Control (2015)  Target Ranges:  Prepandial:   less than 140 mg/dL      Peak postprandial:   less than 180 mg/dL (1-2 hours)      Critically ill patients:  140 - 180 mg/dL   Review of Glycemic Control  Diabetes history: DM2 Outpatient Diabetes medications: Lantus 42 units bid, Novolog 23 units tidwc Current orders for Inpatient glycemic control: Same as above + Novolog sensitive tidwc On Prednisone 50 mg QD.  Results for [name], [name] (MRN [id number]) as of 01/12/2016 09:33  Ref. Range 01/11/2016 08:48 01/11/2016 13:32 01/11/2016 16:12  Glucose-Capillary Latest Ref Range: 65-99 mg/dL 266 (H) 286 (H) 346 (H)   Results for [name], [name] (MRN [id number]) as of 01/12/2016 09:33  Ref. Range 01/11/2016 04:30 01/12/2016 04:45  Glucose Latest Ref Range: 65-99 mg/dL 261 (H) 349 (H)   Hyperglycemia in the presence of steroids.  Inpatient Diabetes Program Recommendations:    Check HgbA1C to assess glycemic control prior to hospitalization. If FBS stays > 180 mg/dL, gradually titrate Lantus to 44 units bid.  Will follow.  Thank you. Lorenda Peck, RD, LDN, CDE Inpatient Diabetes Coordinator [phone]

## 2016-01-13 DIAGNOSIS — I480 Paroxysmal atrial fibrillation: Secondary | ICD-10-CM

## 2016-01-13 LAB — BASIC METABOLIC PANEL
Anion gap: 9 (ref 5–15)
BUN: 51 mg/dL — ABNORMAL HIGH (ref 6–20)
CO2: 29 mmol/L (ref 22–32)
Calcium: 8.6 mg/dL — ABNORMAL LOW (ref 8.9–10.3)
Chloride: 99 mmol/L — ABNORMAL LOW (ref 101–111)
Creatinine, Ser: 1.73 mg/dL — ABNORMAL HIGH (ref 0.61–1.24)
GFR calc Af Amer: 41 mL/min — ABNORMAL LOW (ref >60)
GFR calc non Af Amer: 36 mL/min — ABNORMAL LOW (ref >60)
Glucose, Bld: 399 mg/dL — ABNORMAL HIGH (ref 65–99)
Potassium: 5.2 mmol/L — ABNORMAL HIGH (ref 3.5–5.1)
Sodium: 137 mmol/L (ref 135–145)

## 2016-01-13 LAB — GLUCOSE, CAPILLARY
Glucose-Capillary: 371 mg/dL — ABNORMAL HIGH (ref 65–99)
Glucose-Capillary: 361 mg/dL — ABNORMAL HIGH (ref 65–99)
Glucose-Capillary: 320 mg/dL — ABNORMAL HIGH (ref 65–99)
Glucose-Capillary: 374 mg/dL — ABNORMAL HIGH (ref 65–99)

## 2016-01-13 LAB — CBC
HCT: 35.1 % — ABNORMAL LOW (ref 39.0–52.0)
Hemoglobin: 10.7 g/dL — ABNORMAL LOW (ref 13.0–17.0)
MCH: 25.2 pg — ABNORMAL LOW (ref 26.0–34.0)
MCHC: 30.5 g/dL (ref 30.0–36.0)
MCV: 82.8 fL (ref 78.0–100.0)
Platelets: 230 10*3/uL (ref 150–400)
RBC: 4.24 MIL/uL (ref 4.22–5.81)
RDW: 17.6 % — ABNORMAL HIGH (ref 11.5–15.5)
WBC: 12.1 10*3/uL — ABNORMAL HIGH (ref 4.0–10.5)

## 2016-01-13 LAB — PROTIME-INR
INR: 2.68 — ABNORMAL HIGH (ref 0.00–1.49)
Prothrombin Time: 28.1 seconds — ABNORMAL HIGH (ref 11.6–15.2)

## 2016-01-13 MED ORDER — POLYETHYLENE GLYCOL 3350 17 G PO PACK
17.0000 g | PACK | Freq: Two times a day (BID) | ORAL | Status: DC
  Administered 2016-01-14 – 2016-01-16 (×4): 17 g via ORAL
  Filled 2016-01-13 (×3): qty 1

## 2016-01-13 MED ORDER — BISACODYL 10 MG RE SUPP
10.0000 mg | Freq: Once | RECTAL | Status: AC
  Administered 2016-01-13: 10 mg via RECTAL
  Filled 2016-01-13: qty 1

## 2016-01-13 MED ORDER — SENNOSIDES-DOCUSATE SODIUM 8.6-50 MG PO TABS
2.0000 | ORAL_TABLET | Freq: Two times a day (BID) | ORAL | Status: DC
  Administered 2016-01-13 – 2016-01-16 (×6): 2 via ORAL
  Filled 2016-01-13 (×6): qty 2

## 2016-01-13 NOTE — Progress Notes (Signed)
Inpatient Diabetes Program Recommendations  AACE/ADA: New Consensus Statement on Inpatient Glycemic Control (2015)  Target Ranges:  Prepandial:   less than 140 mg/dL      Peak postprandial:   less than 180 mg/dL (1-2 hours)      Critically ill patients:  140 - 180 mg/dL   Review of Glycemic Control  Results for [name], [name] (MRN [id number]) as of 01/13/2016 10:25  Ref. Range 01/12/2016 07:38 01/12/2016 12:26 01/12/2016 16:24 01/12/2016 21:27 01/13/2016 08:25  Glucose-Capillary Latest Ref Range: 65-99 mg/dL 276 (H) 210 (H) 282 (H) 289 (H) 371 (H)   On solumedrol 40 mg Q6H. Blood sugars continue in 200-300s. Needs insulin adjustment.  Inpatient Diabetes Program Recommendations:    Increase Lantus to 46 units bid Increase Novolog to 25 units tidwc.  Will follow. Thank you. Lorenda Peck, RD, LDN, CDE Inpatient Diabetes Coordinator [phone]

## 2016-01-13 NOTE — Progress Notes (Signed)
SUBJECTIVE:  Reports filling better this morning, is having a mildly productive cough.  Abdomen still distended but otherwise feeling well.  OBJECTIVE:   Vitals:   Filed Vitals:   01/12/16 2317 01/13/16 0426 01/13/16 0822 01/13/16 0929  BP: 130/63 139/54 138/61   Pulse:   80   Temp: 98.2 F (36.8 C) 98 F (36.7 C) 98 F (36.7 C)   TempSrc: Axillary Axillary Oral   Resp: 23 17 22    Height:      Weight:  263 lb 7.2 oz (119.5 kg)    SpO2: 99% 91% 94% 95%   I&O's:   Intake/Output Summary (Last 24 hours) at 01/13/16 1042 Last data filed at 01/13/16 0932  Gross per 24 hour  Intake    480 ml  Output   2265 ml  Net  -1785 ml   TELEMETRY: Reviewed telemetry pt in paced rhythm:     PHYSICAL EXAM General: Sitting up in recliner. Well developed, well nourished, in no acute distress Head:   Normal cephalic and atramatic  Lungs:  Mild right basal rales. No wheezing Heart:  HRRR S1 S2  No JVD.   Abdomen: abdomen distended and non-tender Msk:  Back normal,  Normal strength and tone for age. Extremities:  1+ bilateral lower extremity edema.   Neuro: Alert and oriented. Psych:  Normal affect, responds appropriately Skin: No rash   LABS: Basic Metabolic Panel:  Recent Labs  01/12/16 0445 01/13/16 0216  NA 137 137  K 4.7 5.2*  CL 100* 99*  CO2 28 29  GLUCOSE 349* 399*  BUN 38* 51*  CREATININE 1.72* 1.73*  CALCIUM 8.4* 8.6*   Liver Function Tests:  Recent Labs  01/10/16 2131 01/11/16 0430  AST 33 29  ALT 24 25  ALKPHOS 65 70  BILITOT 0.2* 0.4  PROT 6.2* 6.5  ALBUMIN 3.3* 3.3*   No results for input(s): LIPASE, AMYLASE in the last 72 hours. CBC:  Recent Labs  01/10/16 2131 01/12/16 0445 01/13/16 0216  WBC 7.1 12.5* 12.1*  NEUTROABS 4.6  --   --   HGB 10.0* 10.1* 10.7*  HCT 32.6* 33.3* 35.1*  MCV 83.8 83.9 82.8  PLT 195 194 230   Cardiac Enzymes:  Recent Labs  01/11/16 0430  TROPONINI 0.04*   Coag Panel:   Lab Results  Component Value Date    INR 2.68* 01/13/2016   INR 2.45* 01/12/2016   INR 2.18* 01/11/2016    RADIOLOGY: Dg Chest Port 1 View  01/10/2016  CLINICAL DATA:  RESP DISTRESS, COPD, DM, AFIB EXAM: PORTABLE CHEST 1 VIEW COMPARISON:  01/17/2014, 09/02/2011 FINDINGS: Status post median sternotomy. Left-sided AICD lead overlies the right atrium. The heart is enlarged. There is increased pulmonary opacity bilaterally, right greater than left, consistent with edema and/or infectious process. Lung bases are excluded. IMPRESSION: 1. Cardiomegaly. 2. Suspect interstitial edema. Possible superimposed infectious infiltrate in the right lung. Electronically Signed   By: Nolon Nations M.D.   On: 01/10/2016 22:16      ASSESSMENT: 82 year old male admitted with increased dyspnea due to COPD exacerbation  PLAN:   COPD exacerbation: improving - IV Steroids, Azithromycin, nebulizers  Chronic systolic CHF: EF 123456 -Weight stable at 263 (down from 270 recorded on admission) -SCr stable at 1.73 but BUN trending up to 51 -Lasix currently held  AoCKD3 - ARB held, holding Lasix -Repeat BMP in AM.  Paroxsymal Afib - Currently in paced rhythm on amiodarone. - CHADSVASC of 8, A/C with warfarin,  theraputic  DM2 -Hyperglycemic due to steroids - Management per hospitalist.  Lucious Groves, DO  [facility] PGY-3 01/13/2016  10:42 AM   I have examined the patient and reviewed assessment and plan and discussed with patient.  Agree with above as stated.  Continue to hold Lasix.  Improving with steroids.  Much less wheezing today.  INR therapeutic.  Reign Bartnick S.

## 2016-01-13 NOTE — Care Management Important Message (Signed)
Important Message  Patient Details  Name: [name] MRN: [id number] Date of Birth: [birth year]   Medicare Important Message Given:  Yes    Rane Dumm P Tjay Velazquez 01/13/2016, 11:55 AM

## 2016-01-13 NOTE — Progress Notes (Signed)
Patient ID: [name], male   DOB: Mar 17, 1934, 82 y.o.   MRN: [id number]   [facility] PROGRESS NOTE  [name] [id number] DOB: 08-May-1934 DOA: 01-15-16 PCP: Irven Shelling, MD   Brief narrative:    82 yo male with history of COPD on CPAP, left subclavian stenosis, CHF (systolic, EF [id number] in [id number]), Afib s/p biventricular defibrillator, CAD s/p CABG, hypothyroidism, HTN, CVA who presented to [facility] ED with main concern of several days duration of progressively worsening dyspnea that initially started with exertion and has progressed to dyspnea at rest, associated with LE swelling, 2-3 pillow orthopnea, wheezing, mixed non productive and productive cough of clear sputum, poor oral intake and malaise. No reported fevers, chills, abd or urinary concerns.    Assessment/Plan:    Active Problems:   Acute respiratory failure with hypoxia (HCC) / Acute COPD / RLL PNA unknown pathogen  - multifactorial and secondary to RLL PNA, CAP, Acute COPD, Acute on chronic systolic CHF - placed on Lasix for diuresis, weight down from 270 lbs on admission to 261 --> 263 lbs this AM - cardiology assistance is appreciated  - continue empiric ABX for PNA - BD's scheduled and as needed with prednisone tapering  - sputum analysis still pending    Acute on chronic COPD - still course breath sounds with wheezing - continue BD's scheduled and as needed - also continue ABX, solumedrol, no planned tapering yet until resp status improves     Acute on chronic systolic CHF (congestive heart failure), NYHA class 3 (HCC) - weight 270 lbs on admission --> 261 lbs --> 263this AM  - lasix per cardiology  - monitor clinical response, strict I/O, daily weights     Acute renal failure superimposed on stage 3 chronic kidney disease ([place]) - with Cr now up from last normal baseline - monitor closely  - I/O, daily weights - BMP in AM    Hyperkalemia - mild, will repeat BMP in AM    Acute  encephalopathy, hospital delirium night of 3/19 - resolved     Peripheral vascular disease ([place]) - continue statin, DM and HTN control     PAF (paroxysmal atrial fibrillation) ([place]), chronic, CHADS2 score 5-6 - rate controlled  - continue Coreg and Amiodarone - continue Coumadin per pharmacy     Essential hypertension - SBP in 90's this AM - currently on Lasix, Avapro, Coreg, Spironolactone  - will hold Avapro as Cr also up     OSA (obstructive sleep apnea)    Demand ischemia (HCC) - trop mildly elevated, secondary to acute illness outlined above - no chest pain this AM    Diabetes mellitus with diabetic neuropathy, with long-term current use of insulin ([place]) - continue with home medical regimen     Morbid obesity due to excess calories (HCC) - Body mass index is 40.07 kg/(m^2).  DVT prophylaxis - on Coumadin   Code Status: Full.  Family Communication:  plan of care discussed with the patient Disposition Plan: Home by 3/23  IV access:  Peripheral IV  Procedures and diagnostic studies:    Dg Chest Port 1 View 01/15/16 Cardiomegaly. 2. Suspect interstitial edema. Possible superimposed infectious infiltrate in the right lung.  Medical Consultants:  Cardiology   Other Consultants:  PT  IAnti-Infectives:   Zithromax 2023-01-15 --> Rocephin 3/19 -->  [name], MD  [facility] Pager [phone]  If 7PM-7AM, please contact night-coverage [email or web address] Password [email or web address] 01/13/2016, 5:26 PM   LOS: 3 days   HPI/Subjective:  Confusion overnight.   Objective: Filed Vitals:   01/13/16 0929 01/13/16 1200 01/13/16 1338 01/13/16 1600  BP:  118/70  136/65  Pulse:  69  70  Temp:  98 F (36.7 C)  98.5 F (36.9 C)  TempSrc:  Oral  Axillary  Resp:  22  21  Height:      Weight:      SpO2: 95%  95% 93%    Intake/Output Summary (Last 24 hours) at 01/13/16 1726 Last data filed at 01/13/16 0932  Gross per 24 hour  Intake    120 ml  Output   1565 ml  Net  -1445 ml     Exam:   General:  Pt is alert, follows commands appropriately, not in acute distress  Cardiovascular: Regular rate and rhythm, no rubs, no gallops  Respiratory: course breath sounds with exp wheezing, diminished breath sounds at bases   Abdomen: Soft, non tender, non distended, bowel sounds present, no guarding  Extremities: +2 pitting LE edema, pulses DP and PT palpable bilaterally  Neuro: Grossly nonfocal  Data Reviewed: Basic Metabolic Panel:  Recent Labs Lab 01/10/16 2131 01/11/16 0430 01/12/16 0445 01/13/16 0216  NA 143 142 137 137  K 4.5 4.9 4.7 5.2*  CL 109 102 100* 99*  CO2 29 30 28 29   GLUCOSE 186* 261* 349* 399*  BUN 19 21* 38* 51*  CREATININE 1.45* 1.43* 1.72* 1.73*  CALCIUM 8.8* 8.8* 8.4* 8.6*   Liver Function Tests:  Recent Labs Lab 01/10/16 2131 01/11/16 0430  AST 33 29  ALT 24 25  ALKPHOS 65 70  BILITOT 0.2* 0.4  PROT 6.2* 6.5  ALBUMIN 3.3* 3.3*   CBC:  Recent Labs Lab 01/10/16 2131 01/12/16 0445 01/13/16 0216  WBC 7.1 12.5* 12.1*  NEUTROABS 4.6  --   --   HGB 10.0* 10.1* 10.7*  HCT 32.6* 33.3* 35.1*  MCV 83.8 83.9 82.8  PLT 195 194 230   Cardiac Enzymes:  Recent Labs Lab 01/11/16 0430  TROPONINI 0.04*   CBG:  Recent Labs Lab 01/12/16 1624 01/12/16 2127 01/13/16 0825 01/13/16 1240 01/13/16 1651  GLUCAP 282* 289* 371* 361* 320*   Recent Results (from the past 240 hour(s))  MRSA PCR Screening     Status: None   Collection Time: 01/11/16 12:10 AM  Result Value Ref Range Status   MRSA by PCR NEGATIVE NEGATIVE Final    Scheduled Meds: . amiodarone  200 mg Oral Daily  . aspirin EC  81 mg Oral Daily  . atorvastatin  80 mg Oral q1800  . azithromycin  500 mg Intravenous Q24H  . bisacodyl  10 mg Rectal Once  . carvedilol  6.25 mg Oral BID  . cefTRIAXone (ROCEPHIN)  IV  1 g Intravenous Q24H  . ezetimibe  10 mg Oral Daily  . insulin aspart  0-9 Units Subcutaneous TID WC  . insulin aspart  23 Units Subcutaneous TID  WC  . insulin glargine  42 Units Subcutaneous BID  . ipratropium-albuterol  3 mL Nebulization Q4H  . levothyroxine  100 mcg Oral QAC breakfast  . methylPREDNISolone (SOLU-MEDROL) injection  40 mg Intravenous Q6H  . mometasone-formoterol  2 puff Inhalation BID  . multivitamin with minerals  1 tablet Oral Daily  . pantoprazole  40 mg Oral Daily  . polyethylene glycol  17 g Oral BID  . senna-docusate  2 tablet Oral BID  . spironolactone  12.5 mg Oral Daily  . vitamin B-12  1,000 mcg Oral Daily  .  warfarin  5 mg Oral q1800  . Warfarin - Physician Dosing Inpatient   Oral q1800   Continuous Infusions:

## 2016-01-13 NOTE — Progress Notes (Signed)
Physical Therapy Treatment Patient Details Name: [name] MRN: [id number] DOB: 1933/12/13 Today's Date: 01/13/2016    History of Present Illness 82 yo male with history of COPD on CPAP, left subclavian stenosis, CHF (systolic, EF [id number] in [id number]), Afib s/p biventricular defibrillator, CAD s/p CABG, hypothyroidism, HTN, CVA who presented to [facility] ED with main concern of several days duration of progressively worsening dyspnea that initially started with exertion and has progressed to dyspnea at rest, associated with LE swelling, 2-3 pillow orthopnea, wheezing, mixed non productive and productive cough of clear sputum, poor oral intake and malaise.    PT Comments    Pt admitted with above diagnosis. Pt currently with functional limitations due to balance adn endurance deficits. Pt was able to ambulate with RW without O2 with sats >93%.  PRogressing well.   Pt will benefit from skilled PT to increase their independence and safety with mobility to allow discharge to the venue listed below.    Follow Up Recommendations  Other (comment) (Stated he exercised 3xweek but doesn't know where he went)     Equipment Recommendations  None recommended by PT    Recommendations for Other Services       Precautions / Restrictions Precautions Precautions: Fall Precaution Comments: watch O2 sats Restrictions Weight Bearing Restrictions: No    Mobility  Bed Mobility Overal bed mobility: Modified Independent                Transfers Overall transfer level: Needs assistance Equipment used: Rolling walker (2 wheeled) Transfers: Sit to/from Stand Sit to Stand: Supervision         General transfer comment: pt stood safely to RW without physical assist  Ambulation/Gait Ambulation/Gait assistance: Supervision Ambulation Distance (Feet): 450 Feet Assistive device: Rolling walker (2 wheeled) Gait Pattern/deviations: Step-through pattern;Decreased stride length Gait velocity: WFL Gait  velocity interpretation: at or above normal speed for age/gender General Gait Details: pt with quick gait speed and tolerated ambulation well on RA with sats 93-98%. Somewhat impulsive, vc's for safety   Stairs            Wheelchair Mobility    Modified Rankin (Stroke Patients Only)       Balance Overall balance assessment: No apparent balance deficits (not formally assessed)                                  Cognition Arousal/Alertness: Awake/alert Behavior During Therapy: WFL for tasks assessed/performed Overall Cognitive Status: Within Functional Limits for tasks assessed                      Exercises      General Comments        Pertinent Vitals/Pain Pain Assessment: No/denies pain  VSS with HR 90-103 bpm, O2 sats on RA 93-98% with ambulation.      Home Living                      Prior Function            PT Goals (current goals can now be found in the care plan section) Progress towards PT goals: Progressing toward goals    Frequency  Min 3X/week    PT Plan Current plan remains appropriate    Co-evaluation             End of Session Equipment Utilized During Treatment: Gait belt Activity Tolerance: Patient  tolerated treatment well Patient left: in chair;with call bell/phone within reach;with family/visitor present     Time: [id number] PT Time Calculation (min) (ACUTE ONLY): 13 min  Charges:  $Gait Training: 8-22 mins                    G CodesDenice Paradise January 23, 2016, 12:43 PM Ken Bonn,PT Acute Rehabilitation [phone] (pager)

## 2016-01-14 DIAGNOSIS — I5022 Chronic systolic (congestive) heart failure: Secondary | ICD-10-CM | POA: Insufficient documentation

## 2016-01-14 LAB — CBC
HCT: 35.2 % — ABNORMAL LOW (ref 39.0–52.0)
Hemoglobin: 10.9 g/dL — ABNORMAL LOW (ref 13.0–17.0)
MCH: 25.5 pg — ABNORMAL LOW (ref 26.0–34.0)
MCHC: 31 g/dL (ref 30.0–36.0)
MCV: 82.2 fL (ref 78.0–100.0)
Platelets: 228 10*3/uL (ref 150–400)
RBC: 4.28 MIL/uL (ref 4.22–5.81)
RDW: 17.3 % — ABNORMAL HIGH (ref 11.5–15.5)
WBC: 14 10*3/uL — ABNORMAL HIGH (ref 4.0–10.5)

## 2016-01-14 LAB — BASIC METABOLIC PANEL
Anion gap: 6 (ref 5–15)
BUN: 50 mg/dL — ABNORMAL HIGH (ref 6–20)
CO2: 30 mmol/L (ref 22–32)
Calcium: 8.6 mg/dL — ABNORMAL LOW (ref 8.9–10.3)
Chloride: 98 mmol/L — ABNORMAL LOW (ref 101–111)
Creatinine, Ser: 1.49 mg/dL — ABNORMAL HIGH (ref 0.61–1.24)
GFR calc Af Amer: 49 mL/min — ABNORMAL LOW (ref >60)
GFR calc non Af Amer: 43 mL/min — ABNORMAL LOW (ref >60)
Glucose, Bld: 276 mg/dL — ABNORMAL HIGH (ref 65–99)
Potassium: 4.7 mmol/L (ref 3.5–5.1)
Sodium: 134 mmol/L — ABNORMAL LOW (ref 135–145)

## 2016-01-14 LAB — GLUCOSE, CAPILLARY
Glucose-Capillary: 242 mg/dL — ABNORMAL HIGH (ref 65–99)
Glucose-Capillary: 286 mg/dL — ABNORMAL HIGH (ref 65–99)
Glucose-Capillary: 189 mg/dL — ABNORMAL HIGH (ref 65–99)
Glucose-Capillary: 125 mg/dL — ABNORMAL HIGH (ref 65–99)

## 2016-01-14 LAB — PROTIME-INR
INR: 2.74 — ABNORMAL HIGH (ref 0.00–1.49)
Prothrombin Time: 28.6 seconds — ABNORMAL HIGH (ref 11.6–15.2)

## 2016-01-14 MED ORDER — ALBUTEROL SULFATE (2.5 MG/3ML) 0.083% IN NEBU: 2.5000 mg | INHALATION_SOLUTION | RESPIRATORY_TRACT | Status: DC | PRN

## 2016-01-14 MED ORDER — METHYLPREDNISOLONE SODIUM SUCC 40 MG IJ SOLR
40.0000 mg | Freq: Two times a day (BID) | INTRAMUSCULAR | Status: DC
  Administered 2016-01-15 – 2016-01-16 (×3): 40 mg via INTRAVENOUS
  Filled 2016-01-14 (×3): qty 1

## 2016-01-14 MED ORDER — INSULIN GLARGINE 100 UNIT/ML ~~LOC~~ SOLN
46.0000 [IU] | Freq: Two times a day (BID) | SUBCUTANEOUS | Status: DC
  Administered 2016-01-14 – 2016-01-15 (×2): 46 [IU] via SUBCUTANEOUS
  Filled 2016-01-14 (×4): qty 0.46

## 2016-01-14 MED ORDER — AZITHROMYCIN 500 MG PO TABS: 500.0000 mg | ORAL_TABLET | Freq: Every day | ORAL | Status: DC

## 2016-01-14 MED ORDER — INSULIN ASPART 100 UNIT/ML ~~LOC~~ SOLN
26.0000 [IU] | Freq: Three times a day (TID) | SUBCUTANEOUS | Status: DC
  Administered 2016-01-14 – 2016-01-16 (×5): 26 [IU] via SUBCUTANEOUS

## 2016-01-14 MED ORDER — BUDESONIDE 0.25 MG/2ML IN SUSP
0.2500 mg | Freq: Two times a day (BID) | RESPIRATORY_TRACT | Status: DC
  Administered 2016-01-14 – 2016-01-16 (×4): 0.25 mg via RESPIRATORY_TRACT
  Filled 2016-01-14 (×4): qty 2

## 2016-01-14 MED ORDER — FUROSEMIDE 40 MG PO TABS
40.0000 mg | ORAL_TABLET | Freq: Two times a day (BID) | ORAL | Status: DC
  Administered 2016-01-14 – 2016-01-16 (×4): 40 mg via ORAL
  Filled 2016-01-14 (×4): qty 1

## 2016-01-14 MED ORDER — IPRATROPIUM-ALBUTEROL 0.5-2.5 (3) MG/3ML IN SOLN
3.0000 mL | Freq: Four times a day (QID) | RESPIRATORY_TRACT | Status: DC
  Administered 2016-01-14 – 2016-01-15 (×2): 3 mL via RESPIRATORY_TRACT
  Filled 2016-01-14 (×2): qty 3

## 2016-01-14 NOTE — Progress Notes (Signed)
SUBJECTIVE:  Feels much better this morning.  OBJECTIVE:   Vitals:   Filed Vitals:   01/14/16 0300 01/14/16 0304 01/14/16 0350 01/14/16 0943  BP:  156/75    Pulse:   69   Temp:  97.5 F (36.4 C)    TempSrc:  Axillary    Resp:  22 33   Height:      Weight: 262 lb 9.1 oz (119.1 kg)     SpO2:  94% 99% 92%   I&O's:    Intake/Output Summary (Last 24 hours) at 01/14/16 1053 Last data filed at 01/14/16 0900  Gross per 24 hour  Intake   1210 ml  Output    600 ml  Net    610 ml   TELEMETRY: Reviewed telemetry pt in paced rhythm with occ PVC     PHYSICAL EXAM General: Sitting up in recliner. Well developed, well nourished, in no acute distress Head:   Normal cephalic and atramatic  Lungs:  Mild expiratory wheezing bilaterally Heart:  HRRR S1 S2    Abdomen: abdomen distended and non-tender Msk:  Back normal,  Normal strength and tone for age. Extremities:  1+ bilateral lower extremity edema.   Neuro: Alert and oriented. Psych:  Normal affect, responds appropriately Skin: No rash   LABS: Basic Metabolic Panel:  Recent Labs  01/13/16 0216 01/14/16 0300  NA 137 134*  K 5.2* 4.7  CL 99* 98*  CO2 29 30  GLUCOSE 399* 276*  BUN 51* 50*  CREATININE 1.73* 1.49*  CALCIUM 8.6* 8.6*   Liver Function Tests: No results for input(s): AST, ALT, ALKPHOS, BILITOT, PROT, ALBUMIN in the last 72 hours. No results for input(s): LIPASE, AMYLASE in the last 72 hours. CBC:  Recent Labs  01/13/16 0216 01/14/16 0300  WBC 12.1* 14.0*  HGB 10.7* 10.9*  HCT 35.1* 35.2*  MCV 82.8 82.2  PLT 230 228   Cardiac Enzymes: No results for input(s): CKTOTAL, CKMB, CKMBINDEX, TROPONINI in the last 72 hours. Coag Panel:   Lab Results  Component Value Date   INR 2.74* 01/14/2016   INR 2.68* 01/13/2016   INR 2.45* 01/12/2016    RADIOLOGY: Dg Chest Port 1 View  01/10/2016  CLINICAL DATA:  RESP DISTRESS, COPD, DM, AFIB EXAM: PORTABLE CHEST 1 VIEW COMPARISON:  01/17/2014, 09/02/2011  FINDINGS: Status post median sternotomy. Left-sided AICD lead overlies the right atrium. The heart is enlarged. There is increased pulmonary opacity bilaterally, right greater than left, consistent with edema and/or infectious process. Lung bases are excluded. IMPRESSION: 1. Cardiomegaly. 2. Suspect interstitial edema. Possible superimposed infectious infiltrate in the right lung. Electronically Signed   By: Nolon Nations M.D.   On: 01/10/2016 22:16      ASSESSMENT: 82 year old male admitted with increased dyspnea due to COPD exacerbation  PLAN:   COPD exacerbation/ possible CAP: improving - IV Steroids, Azithromycin +Ceftriaxone, nebulizer -Can be transferred out of stepdown -PT note patient able to ambulate with RW and maintain O2 sat.  Chronic systolic CHF: EF 123456 -Weight stable at 262 (down from 270 recorded on admission) -Renal function improving -Lasix currently held but can resume home oral dose (40mg  BID)  AoCKD3- improving - ARB held,  -Resume home oral lasix 40mg  BID -Repeat BMP in AM   Paroxsymal Afib - Currently in paced rhythm on amiodarone. - CHADSVASC of 8, A/C with warfarin, theraputic  DM2 -Hyperglycemic due to steroids, improved - Management per hospitalist.  Dispo: likely home tomorrow  Lucious Groves, DO  [facility] PGY-3 01/14/2016  10:53 AM  I have examined the patient and reviewed assessment and plan and discussed with patient.  Agree with above as stated.  OK to resume home dose of Lasix.  Wheezing much improved. Renal function improving.  Chaylee Ehrsam S.

## 2016-01-14 NOTE — Progress Notes (Addendum)
[place] TEAM 1 - Stepdown/ICU TEAM PROGRESS NOTE  [name] [id number] DOB: Mar 16, 1934 DOA: 01/10/2016 PCP: Irven Shelling, MD  Admit HPI / Brief Narrative: 82 yo male with history of COPD, CPAP, left subclavian stenosis, CHF (EF 40% in 2014), Afib s/p biventricular defibrillator, CAD s/p CABG, hypothyroidism, HTN, and CVA who presented to [facility] ED with several days duration of progressively worsening dyspnea that initially started with exertion and progressed to dyspnea at rest, associated with LE swelling, 2-3 pillow orthopnea, wheezing, mixed non productive and productive cough of clear sputum, poor oral intake and malaise. No reported fevers, chills, abd or urinary concerns.   HPI/Subjective: The patient states he feels unsteady on his feet and becomes very dyspneic with exertion.  He feels weak.  He denies chest pain nausea vomiting or abdominal pain.  He has noted wheezing recurring today.  Assessment/Plan:  Acute respiratory failure with hypoxia - multifactorial and secondary to RLL PNA, Acute COPD, Acute on chronic systolic CHF  Acute COPD exacerbation  - Appears to be recurring - adjust medical therapy and continue to follow  RLL PNA - on empiric abx - follow-up chest x-ray in a.m.  Acute on chronic systolic CHF (congestive heart failure), NYHA class 3 (HCC) - weight 270 lbs on admission - Cardiology following - further diuresis has been hampered by worsening renal failure Filed Weights   01/12/16 0416 01/13/16 0426 01/14/16 0300  Weight: 118.8 kg (261 lb 14.5 oz) 119.5 kg (263 lb 7.2 oz) 119.1 kg (262 lb 9.1 oz)   Acute renal failure superimposed on stage 3 chronic kidney disease crt peaked at 1.73 - currently on downward trend w/ holding of diuretic   Hyperkalemia Corrected  Acute encephalopathy, hospital delirium night of 3/19 Appears resolved  Peripheral vascular disease  Continue medical therapy  PAF chronic, CHADS2 score 5-6 - rate controlled  - continue Coumadin per pharmacy   Essential hypertension Blood pressure currently well controlled  OSA (obstructive sleep apnea)  Demand ischemia - trop mildly elevated, secondary to acute illness outlined above  Diabetes mellitus with diabetic neuropathy, with long-term current use of insulin  - Poorly controlled - adjust regimen and follow  Morbid obesity - Body mass index is 39.93 kg/(m^2).  Code Status: FULL Family Communication: Spoke with wife at bedside Disposition Plan: Transfer to telemetry bed - PT/OT - monitor exam  Consultants: [facility]   Procedures: none  Antibiotics: Zithromax 3/18 > Rocephin 3/19 >  DVT prophylaxis: warfarin  Objective: Blood pressure 127/67, pulse 71, temperature 97.6 F (36.4 C), temperature source Oral, resp. rate 33, height 5\' 8"  (1.727 m), weight 119.1 kg (262 lb 9.1 oz), SpO2 91 %.  Intake/Output Summary (Last 24 hours) at 01/14/16 1614 Last data filed at 01/14/16 0908  Gross per 24 hour  Intake    890 ml  Output    400 ml  Net    490 ml    Exam: General: No acute respiratory distress at rest in chair Lungs: Poor air movement throughout all fields, mild bibasilar crackles right greater than left, mild diffuse expiratory wheeze Cardiovascular: Regular rate without murmur gallop or rub normal S1 and S2 Abdomen: Nontender, protuberant/overweight, soft, bowel sounds positive, no rebound, no ascites, no appreciable mass Extremities: No significant cyanosis, or clubbing; 1+edema bilateral lower extremities  Data Reviewed:  Basic Metabolic Panel:  Recent Labs Lab 01/10/16 2131 01/11/16 0430 01/12/16 0445 01/13/16 0216 01/14/16 0300  NA 143 142 137 137 134*  K 4.5 4.9 4.7 5.2*  4.7  CL 109 102 100* 99* 98*  CO2 29 30 28 29 30   GLUCOSE 186* 261* 349* 399* 276*  BUN 19 21* 38* 51* 50*  CREATININE 1.45* 1.43* 1.72* 1.73* 1.49*  CALCIUM 8.8* 8.8* 8.4* 8.6* 8.6*    CBC:  Recent Labs Lab 01/10/16 2131  01/12/16 0445 01/13/16 0216 01/14/16 0300  WBC 7.1 12.5* 12.1* 14.0*  NEUTROABS 4.6  --   --   --   HGB 10.0* 10.1* 10.7* 10.9*  HCT 32.6* 33.3* 35.1* 35.2*  MCV 83.8 83.9 82.8 82.2  PLT 195 194 230 228    Liver Function Tests:  Recent Labs Lab 01/10/16 2131 01/11/16 0430  AST 33 29  ALT 24 25  ALKPHOS 65 70  BILITOT 0.2* 0.4  PROT 6.2* 6.5  ALBUMIN 3.3* 3.3*    Coags:  Recent Labs Lab 01/11/16 0430 01/12/16 0818 01/13/16 0216 01/14/16 0300  INR 2.18* 2.45* 2.68* 2.74*    Recent Labs Lab 01/11/16 0430  APTT 35    Cardiac Enzymes:  Recent Labs Lab 01/11/16 0430  TROPONINI 0.04*    CBG:  Recent Labs Lab 01/13/16 1240 01/13/16 1651 01/13/16 2140 01/14/16 0855 01/14/16 1259  GLUCAP 361* 320* 374* 242* 286*    Recent Results (from the past 240 hour(s))  MRSA PCR Screening     Status: None   Collection Time: 01/11/16 12:10 AM  Result Value Ref Range Status   MRSA by PCR NEGATIVE NEGATIVE Final    Comment:        The GeneXpert MRSA Assay (FDA approved for NASAL specimens only), is one component of a comprehensive MRSA colonization surveillance program. It is not intended to diagnose MRSA infection nor to guide or monitor treatment for MRSA infections.      Studies:   Recent x-ray studies have been reviewed in detail by the Attending Physician  Scheduled Meds:  Scheduled Meds: . amiodarone  200 mg Oral Daily  . aspirin EC  81 mg Oral Daily  . atorvastatin  80 mg Oral q1800  . [START ON 01/15/2016] azithromycin  500 mg Oral Daily  . carvedilol  6.25 mg Oral BID  . cefTRIAXone (ROCEPHIN)  IV  1 g Intravenous Q24H  . ezetimibe  10 mg Oral Daily  . insulin aspart  0-9 Units Subcutaneous TID WC  . insulin aspart  23 Units Subcutaneous TID WC  . insulin glargine  42 Units Subcutaneous BID  . ipratropium-albuterol  3 mL Nebulization Q4H  . levothyroxine  100 mcg Oral QAC breakfast  . methylPREDNISolone (SOLU-MEDROL) injection  40 mg  Intravenous Q6H  . mometasone-formoterol  2 puff Inhalation BID  . multivitamin with minerals  1 tablet Oral Daily  . pantoprazole  40 mg Oral Daily  . polyethylene glycol  17 g Oral BID  . senna-docusate  2 tablet Oral BID  . spironolactone  12.5 mg Oral Daily  . vitamin B-12  1,000 mcg Oral Daily  . warfarin  5 mg Oral q1800  . Warfarin - Physician Dosing Inpatient   Oral q1800    Time spent on care of this patient: 35 mins   [facility] T , MD   [facility] Office  [phone] Pager - Text Page per Shea Evans as per below:  On-Call/Text Page:      [email or web address]      password [email or web address]  If 7PM-7AM, please contact night-coverage [email or web address] Password [email or web address] 01/14/2016, 4:14 PM   LOS: 4 days

## 2016-01-15 ENCOUNTER — Telehealth: Payer: Self-pay | Admitting: Cardiology

## 2016-01-15 ENCOUNTER — Inpatient Hospital Stay (HOSPITAL_COMMUNITY): Payer: Medicare Other

## 2016-01-15 DIAGNOSIS — G4733 Obstructive sleep apnea (adult) (pediatric): Secondary | ICD-10-CM

## 2016-01-15 DIAGNOSIS — I5023 Acute on chronic systolic (congestive) heart failure: Secondary | ICD-10-CM

## 2016-01-15 LAB — BASIC METABOLIC PANEL
Anion gap: 10 (ref 5–15)
BUN: 56 mg/dL — ABNORMAL HIGH (ref 6–20)
CO2: 28 mmol/L (ref 22–32)
Calcium: 8.5 mg/dL — ABNORMAL LOW (ref 8.9–10.3)
Chloride: 100 mmol/L — ABNORMAL LOW (ref 101–111)
Creatinine, Ser: 1.55 mg/dL — ABNORMAL HIGH (ref 0.61–1.24)
GFR calc Af Amer: 47 mL/min — ABNORMAL LOW (ref >60)
GFR calc non Af Amer: 41 mL/min — ABNORMAL LOW (ref >60)
Glucose, Bld: 224 mg/dL — ABNORMAL HIGH (ref 65–99)
Potassium: 4.8 mmol/L (ref 3.5–5.1)
Sodium: 138 mmol/L (ref 135–145)

## 2016-01-15 LAB — GLUCOSE, CAPILLARY
Glucose-Capillary: 149 mg/dL — ABNORMAL HIGH (ref 65–99)
Glucose-Capillary: 153 mg/dL — ABNORMAL HIGH (ref 65–99)
Glucose-Capillary: 73 mg/dL (ref 65–99)
Glucose-Capillary: 42 mg/dL — CL (ref 65–99)
Glucose-Capillary: 79 mg/dL (ref 65–99)

## 2016-01-15 LAB — PROTIME-INR
INR: 3.42 — ABNORMAL HIGH (ref 0.00–1.49)
Prothrombin Time: 33.8 seconds — ABNORMAL HIGH (ref 11.6–15.2)

## 2016-01-15 LAB — CBC
HCT: 35.7 % — ABNORMAL LOW (ref 39.0–52.0)
Hemoglobin: 11.3 g/dL — ABNORMAL LOW (ref 13.0–17.0)
MCH: 26 pg (ref 26.0–34.0)
MCHC: 31.7 g/dL (ref 30.0–36.0)
MCV: 82.1 fL (ref 78.0–100.0)
Platelets: 223 10*3/uL (ref 150–400)
RBC: 4.35 MIL/uL (ref 4.22–5.81)
RDW: 17.1 % — ABNORMAL HIGH (ref 11.5–15.5)
WBC: 13.9 10*3/uL — ABNORMAL HIGH (ref 4.0–10.5)

## 2016-01-15 LAB — TSH: TSH: 1.16 u[IU]/mL (ref 0.350–4.500)

## 2016-01-15 MED ORDER — INSULIN GLARGINE 100 UNIT/ML ~~LOC~~ SOLN
42.0000 [IU] | Freq: Two times a day (BID) | SUBCUTANEOUS | Status: DC
  Administered 2016-01-16: 42 [IU] via SUBCUTANEOUS
  Filled 2016-01-15 (×3): qty 0.42

## 2016-01-15 MED ORDER — GUAIFENESIN ER 600 MG PO TB12
600.0000 mg | ORAL_TABLET | Freq: Two times a day (BID) | ORAL | Status: DC
Start: 2016-01-15 — End: 2016-01-16
  Administered 2016-01-15 – 2016-01-16 (×2): 600 mg via ORAL
  Filled 2016-01-15 (×2): qty 1

## 2016-01-15 MED ORDER — WARFARIN - PHARMACIST DOSING INPATIENT: Freq: Every day | Status: DC

## 2016-01-15 MED ORDER — IPRATROPIUM-ALBUTEROL 0.5-2.5 (3) MG/3ML IN SOLN
3.0000 mL | Freq: Three times a day (TID) | RESPIRATORY_TRACT | Status: DC
  Administered 2016-01-15 – 2016-01-16 (×3): 3 mL via RESPIRATORY_TRACT
  Filled 2016-01-15 (×3): qty 3

## 2016-01-15 NOTE — Progress Notes (Signed)
    We have transitioned patient to home oral lasix yesterday. We will sign off. Please call us with questions. I have arranged follow up.   Angelena Form PA-C  [facility]

## 2016-01-15 NOTE — Telephone Encounter (Signed)
TCM pt.  Pt is currently still in the hospital.  Will leave this message on triage desktop for TCM follow-up, once the pt is discharged from the hospital.

## 2016-01-15 NOTE — Progress Notes (Signed)
[facility] TEAM 1 - Stepdown/ICU TEAM PROGRESS NOTE  [name] [id number] DOB: 09-01-1934 DOA: 01/10/2016 PCP: Irven Shelling, MD  Admit HPI / Brief Narrative: 82 yo male with history of COPD, CPAP, left subclavian stenosis, CHF (EF 40% in 2014), Afib s/p biventricular defibrillator, CAD s/p CABG, hypothyroidism, HTN, and CVA who presented to [facility] ED with several days duration of progressively worsening dyspnea that initially started with exertion and progressed to dyspnea at rest, associated with LE swelling, 2-3 pillow orthopnea, wheezing, mixed non productive and productive cough of clear sputum, poor oral intake and malaise. No reported fevers, chills, abd or urinary concerns.   HPI/Subjective: Reported productive cough, persistent wheezing, denies chest pain, on room air at rest.  Assessment/Plan:  Acute respiratory failure with hypoxia - multifactorial and secondary to RLL PNA, Acute COPD, Acute on chronic systolic CHF -now on room air at rest, will ambulate and check o2 sats  Acute COPD exacerbation  - reported productive cough and wheezing on 3/23, sputum culture pending, add mucinex, continue nebs, abx, steroids  RLL PNA - on empiric abx - chest ct  Acute on chronic systolic CHF (congestive heart failure), NYHA class 3 (HCC) - weight 270 lbs on admission - Cardiology following - further diuresis has been hampered by worsening renal failure Filed Weights   01/14/16 0300 01/14/16 2112 01/15/16 0525  Weight: 119.1 kg (262 lb 9.1 oz) 119.614 kg (263 lb 11.2 oz) 119.205 kg (262 lb 12.8 oz)   Acute renal failure superimposed on stage 3 chronic kidney disease crt peaked at 1.73 - currently on downward trend w/ holding of diuretic   Hyperkalemia Corrected  Acute encephalopathy, hospital delirium night of 3/19 Appears resolved  Peripheral vascular disease  Continue medical therapy  PAF chronic, CHADS2 score 5-6 - rate controlled - continue Coumadin per  pharmacy   Essential hypertension Blood pressure currently well controlled  OSA (obstructive sleep apnea) on cpap at night  Demand ischemia - trop mildly elevated, secondary to acute illness outlined above  Diabetes mellitus with diabetic neuropathy, with long-term current use of insulin  - Poorly controlled - adjust regimen and follow  Morbid obesity - Body mass index is 39.97 kg/(m^2).  Code Status: FULL Family Communication: patient in room Disposition Plan: possible home on 3/24 if less wheezing, less cough  Consultants: [facility]   Procedures: none  Antibiotics: Zithromax 3/18 > Rocephin 3/19 >  DVT prophylaxis: warfarin  Objective: Blood pressure 140/62, pulse 80, temperature 98.2 F (36.8 C), temperature source Oral, resp. rate 18, height 5\' 8"  (1.727 m), weight 119.205 kg (262 lb 12.8 oz), SpO2 97 %.  Intake/Output Summary (Last 24 hours) at 01/15/16 1655 Last data filed at 01/15/16 0900  Gross per 24 hour  Intake    940 ml  Output    975 ml  Net    -35 ml    Exam: General: No acute respiratory distress at rest in chair Lungs: Poor air movement throughout all fields, mild bibasilar crackles right greater than left, mild diffuse expiratory wheeze Cardiovascular: Regular rate without murmur gallop or rub normal S1 and S2 Abdomen: Nontender, protuberant/overweight, soft, bowel sounds positive, no rebound, no ascites, no appreciable mass Extremities: No significant cyanosis, or clubbing; 1+edema bilateral lower extremities  Data Reviewed:  Basic Metabolic Panel:  Recent Labs Lab 01/11/16 0430 01/12/16 0445 01/13/16 0216 01/14/16 0300 01/15/16 0250  NA 142 137 137 134* 138  K 4.9 4.7 5.2* 4.7 4.8  CL 102 100* 99* 98* 100*  CO2 30 28 29 30 28   GLUCOSE 261* 349* 399* 276* 224*  BUN 21* 38* 51* 50* 56*  CREATININE 1.43* 1.72* 1.73* 1.49* 1.55*  CALCIUM 8.8* 8.4* 8.6* 8.6* 8.5*    CBC:  Recent Labs Lab 01/10/16 2131 01/12/16 0445  01/13/16 0216 01/14/16 0300 01/15/16 0250  WBC 7.1 12.5* 12.1* 14.0* 13.9*  NEUTROABS 4.6  --   --   --   --   HGB 10.0* 10.1* 10.7* 10.9* 11.3*  HCT 32.6* 33.3* 35.1* 35.2* 35.7*  MCV 83.8 83.9 82.8 82.2 82.1  PLT 195 194 230 228 223    Liver Function Tests:  Recent Labs Lab 01/10/16 2131 01/11/16 0430  AST 33 29  ALT 24 25  ALKPHOS 65 70  BILITOT 0.2* 0.4  PROT 6.2* 6.5  ALBUMIN 3.3* 3.3*    Coags:  Recent Labs Lab 01/11/16 0430 01/12/16 0818 01/13/16 0216 01/14/16 0300 01/15/16 0250  INR 2.18* 2.45* 2.68* 2.74* 3.42*    Recent Labs Lab 01/11/16 0430  APTT 35    Cardiac Enzymes:  Recent Labs Lab 01/11/16 0430  TROPONINI 0.04*    CBG:  Recent Labs Lab 01/14/16 1719 01/14/16 2120 01/15/16 0607 01/15/16 1112 01/15/16 1607  GLUCAP 189* 125* 149* 153* 73    Recent Results (from the past 240 hour(s))  MRSA PCR Screening     Status: None   Collection Time: 01/11/16 12:10 AM  Result Value Ref Range Status   MRSA by PCR NEGATIVE NEGATIVE Final    Comment:        The GeneXpert MRSA Assay (FDA approved for NASAL specimens only), is one component of a comprehensive MRSA colonization surveillance program. It is not intended to diagnose MRSA infection nor to guide or monitor treatment for MRSA infections.      Studies:   Recent x-ray studies have been reviewed in detail by the Attending Physician  Scheduled Meds:  Scheduled Meds: . amiodarone  200 mg Oral Daily  . aspirin EC  81 mg Oral Daily  . atorvastatin  80 mg Oral q1800  . budesonide (PULMICORT) nebulizer solution  0.25 mg Nebulization BID  . carvedilol  6.25 mg Oral BID  . cefTRIAXone (ROCEPHIN)  IV  1 g Intravenous Q24H  . ezetimibe  10 mg Oral Daily  . furosemide  40 mg Oral BID  . insulin aspart  0-9 Units Subcutaneous TID WC  . insulin aspart  26 Units Subcutaneous TID WC  . insulin glargine  46 Units Subcutaneous BID  . ipratropium-albuterol  3 mL Nebulization TID  .  levothyroxine  100 mcg Oral QAC breakfast  . methylPREDNISolone (SOLU-MEDROL) injection  40 mg Intravenous Q12H  . multivitamin with minerals  1 tablet Oral Daily  . pantoprazole  40 mg Oral Daily  . polyethylene glycol  17 g Oral BID  . senna-docusate  2 tablet Oral BID  . spironolactone  12.5 mg Oral Daily  . vitamin B-12  1,000 mcg Oral Daily  . Warfarin - Pharmacist Dosing Inpatient   Does not apply q1800    Time spent on care of this patient: 42 mins   Khristy Kalan , MD PhD [phone]  [facility] Office  [phone] Pager - Text Page per Amion as per below:  On-Call/Text Page:      [email or web address]      password [email or web address]  If 7PM-7AM, please contact night-coverage [email or web address] Password [email or web address] 01/15/2016, 4:55 PM   LOS: 5 days

## 2016-01-15 NOTE — Progress Notes (Signed)
ANTICOAGULATION CONSULT NOTE - Initial Consult  Pharmacy Consult for warfarin Indication: atrial fibrillation  No Known Allergies  Patient Measurements: Height: 5\' 8"  (172.7 cm) Weight: 262 lb 12.8 oz (119.205 kg) (scale a) IBW/kg (Calculated) : 68.4   Vital Signs: Temp: 97.4 F (36.3 C) (03/23 0525) Temp Source: Oral (03/23 0525) BP: 149/59 mmHg (03/23 0525) Pulse Rate: 72 (03/23 0525)  Labs:  Recent Labs  01/13/16 0216 01/14/16 0300 01/15/16 0250  HGB 10.7* 10.9* 11.3*  HCT 35.1* 35.2* 35.7*  PLT 230 228 223  LABPROT 28.1* 28.6* 33.8*  INR 2.68* 2.74* 3.42*  CREATININE 1.73* 1.49* 1.55*    Estimated Creatinine Clearance: 47.7 mL/min (by C-G formula based on Cr of 1.55).   Assessment: 35 YOM with AFib on warfarin PTA. Home dose was 5mg  daily except 7.5mg  on Tuesdays and Thursdays. Warfarin was ordered per MD, asked for pharmacy to take over dosing this morning.  INR 3.42 this morning. MD had ordered warfarin 5mg  daily.  Hgb 11.3, plts 223- no bleeding noted.  Goal of Therapy:  INR 2-3 Monitor platelets by anticoagulation protocol: Yes   Plan:  -hold warfarin tonight -daily INR -follow CBC and s/s bleeding  Berta Denson D. Darroll Bredeson, PharmD, BCPS Clinical Pharmacist Pager: [phone] 01/15/2016 11:15 AM

## 2016-01-15 NOTE — Telephone Encounter (Signed)
New message      TCM appt on 01-29-16 with Cecilie Kicks.

## 2016-01-15 NOTE — Progress Notes (Signed)
Physical Therapy Treatment Patient Details Name: [name] MRN: [id number] DOB: 12/29/1933 Today's Date: 01/15/2016    History of Present Illness 82 yo male with history of COPD on CPAP, left subclavian stenosis, CHF (systolic, EF [id number] in [id number]), Afib s/p biventricular defibrillator, CAD s/p CABG, hypothyroidism, HTN, CVA who presented to [facility] ED with main concern of several days duration of progressively worsening dyspnea that initially started with exertion and has progressed to dyspnea at rest, associated with LE swelling, 2-3 pillow orthopnea, wheezing, mixed non productive and productive cough of clear sputum, poor oral intake and malaise.    PT Comments    Patient progressing, but did have posterior LOB when on stairs with min A for safety to recover.  Feel will need to use both hands to L rail for safety and demonstrated for pt.  Mobilizing nicely in hallway with walker and able to stand more erect with taller walker and after walking a little while.  Feel pt safe for d/c home with no follow up at this time.  Will check back on pt if remains inpatient.  Follow Up Recommendations  Other (comment) (already planning to continue community fitness plan)     Equipment Recommendations  None recommended by PT    Recommendations for Other Services       Precautions / Restrictions Precautions Precautions: Fall Precaution Comments: watch O2 sats    Mobility  Bed Mobility               General bed mobility comments: in recliner  Transfers Overall transfer level: Needs assistance Equipment used: Rolling walker (2 wheeled) Transfers: Sit to/from Stand Sit to Stand: Modified independent (Device/Increase time)         General transfer comment: moving in room to bathroom unaided per pt  Ambulation/Gait Ambulation/Gait assistance: Supervision Ambulation Distance (Feet): 180 Feet (and 200') Assistive device: Rolling walker (2 wheeled);None Gait Pattern/deviations:  Step-through pattern;Decreased stride length;Trunk flexed     General Gait Details: able to straighen up after ambulating a little bit and with elevated height of walker; SpO2 95% ambulating on RA; in room no walker with slight trendelenberg on L; improved with walker   Stairs Stairs: Yes Stairs assistance: Min guard Stair Management: One rail Left;Forwards;Alternating pattern;Step to pattern Number of Stairs: 3 General stair comments: posterior LOB on descending stairs; educated two hands to rail and sideways technique; alternating pattern to ascend, step to pattern to descend  Wheelchair Mobility    Modified Rankin (Stroke Patients Only)       Balance Overall balance assessment: Needs assistance           Standing balance-Leahy Scale: Good Standing balance comment: mild imbalance with ambulation, static balance without deficits                    Cognition Arousal/Alertness: Awake/alert Behavior During Therapy: WFL for tasks assessed/performed Overall Cognitive Status: Within Functional Limits for tasks assessed                      Exercises      General Comments        Pertinent Vitals/Pain Pain Assessment: Faces Faces Pain Scale: Hurts little more Pain Location: back from bed/improved with ambualtion Pain Descriptors / Indicators: Aching Pain Intervention(s): Monitored during session;Repositioned    Home Living                      Prior Function  PT Goals (current goals can now be found in the care plan section) Progress towards PT goals: Progressing toward goals    Frequency  Min 3X/week    PT Plan Current plan remains appropriate    Co-evaluation             End of Session Equipment Utilized During Treatment: Gait belt Activity Tolerance: Patient tolerated treatment well Patient left: in chair;with call bell/phone within reach     Time: 0902-0918 PT Time Calculation (min) (ACUTE ONLY): 16  min  Charges:  $Gait Training: 8-22 mins                    G Codes:      Reginia Naas 01-30-2016, 12:28 PM  Magda Kiel, [place] 2016-01-30

## 2016-01-16 DIAGNOSIS — I10 Essential (primary) hypertension: Secondary | ICD-10-CM

## 2016-01-16 DIAGNOSIS — Z794 Long term (current) use of insulin: Secondary | ICD-10-CM

## 2016-01-16 DIAGNOSIS — E119 Type 2 diabetes mellitus without complications: Secondary | ICD-10-CM

## 2016-01-16 LAB — BASIC METABOLIC PANEL
Anion gap: 8 (ref 5–15)
BUN: 60 mg/dL — ABNORMAL HIGH (ref 6–20)
CO2: 30 mmol/L (ref 22–32)
Calcium: 8.4 mg/dL — ABNORMAL LOW (ref 8.9–10.3)
Chloride: 102 mmol/L (ref 101–111)
Creatinine, Ser: 1.85 mg/dL — ABNORMAL HIGH (ref 0.61–1.24)
GFR calc Af Amer: 38 mL/min — ABNORMAL LOW (ref >60)
GFR calc non Af Amer: 33 mL/min — ABNORMAL LOW (ref >60)
Glucose, Bld: 114 mg/dL — ABNORMAL HIGH (ref 65–99)
Potassium: 5.1 mmol/L (ref 3.5–5.1)
Sodium: 140 mmol/L (ref 135–145)

## 2016-01-16 LAB — GLUCOSE, CAPILLARY: Glucose-Capillary: 171 mg/dL — ABNORMAL HIGH (ref 65–99)

## 2016-01-16 LAB — CBC
HCT: 36.4 % — ABNORMAL LOW (ref 39.0–52.0)
Hemoglobin: 11.2 g/dL — ABNORMAL LOW (ref 13.0–17.0)
MCH: 25.3 pg — ABNORMAL LOW (ref 26.0–34.0)
MCHC: 30.8 g/dL (ref 30.0–36.0)
MCV: 82.4 fL (ref 78.0–100.0)
Platelets: 236 10*3/uL (ref 150–400)
RBC: 4.42 MIL/uL (ref 4.22–5.81)
RDW: 17.2 % — ABNORMAL HIGH (ref 11.5–15.5)
WBC: 11.6 10*3/uL — ABNORMAL HIGH (ref 4.0–10.5)

## 2016-01-16 LAB — HEMOGLOBIN A1C
Hgb A1c MFr Bld: 7.7 % — ABNORMAL HIGH (ref 4.8–5.6)
Mean Plasma Glucose: 174 mg/dL

## 2016-01-16 LAB — PROTIME-INR
INR: 3.39 — ABNORMAL HIGH (ref 0.00–1.49)
Prothrombin Time: 33.6 seconds — ABNORMAL HIGH (ref 11.6–15.2)

## 2016-01-16 MED ORDER — WARFARIN SODIUM 5 MG PO TABS: ORAL_TABLET | ORAL | Status: DC

## 2016-01-16 MED ORDER — SENNOSIDES-DOCUSATE SODIUM 8.6-50 MG PO TABS: 2.0000 | ORAL_TABLET | Freq: Every day | ORAL | Status: DC

## 2016-01-16 MED ORDER — AMOXICILLIN-POT CLAVULANATE 875-125 MG PO TABS: 1.0000 | ORAL_TABLET | Freq: Two times a day (BID) | ORAL | Status: DC

## 2016-01-16 MED ORDER — GUAIFENESIN ER 600 MG PO TB12: 600.0000 mg | ORAL_TABLET | Freq: Two times a day (BID) | ORAL | Status: DC

## 2016-01-16 MED ORDER — FUROSEMIDE 40 MG PO TABS: 40.0000 mg | ORAL_TABLET | Freq: Two times a day (BID) | ORAL | Status: DC

## 2016-01-16 MED ORDER — PREDNISONE 10 MG (21) PO TBPK: 10.0000 mg | ORAL_TABLET | Freq: Every day | ORAL | Status: DC

## 2016-01-16 NOTE — Discharge Summary (Signed)
Discharge Summary  [name] [id number] DOB: December 05, 1933  PCP: Irven Shelling, MD  Admit date: 01/10/2016 Discharge date: 01/16/2016  Time spent: >46mins  Recommendations for Outpatient Follow-up:  1. Please check INR on 3/27, further coumadin adjustment per coumadin clinic 2. F/u with PMD within a week  for hospital discharge follow up, repeat cbc/bmp at follow up, monitor cr level and blood glucose control 3. F/u with cardiology for chf 4. F/u with pulmonology for copd  Discharge Diagnoses:  Active Hospital Problems   Diagnosis Date Noted  . Acute respiratory failure with hypoxia ([place]) 01/11/2016  . Chronic systolic heart failure ([place])   . Acute on chronic systolic CHF (congestive heart failure), NYHA class 3 ([place]) 01/11/2016  . Demand ischemia ([place]) 01/11/2016  . Acute renal failure superimposed on stage 3 chronic kidney disease ([place]) 01/11/2016  . Diabetes mellitus with diabetic neuropathy, with long-term current use of insulin ([place]) 01/11/2016  . Morbid obesity due to excess calories ([place]) 01/11/2016  . COPD exacerbation ([place]) 01/10/2016  . PAF (paroxysmal atrial fibrillation) ([place])   . OSA (obstructive sleep apnea)   . Peripheral vascular disease ([place]) 10/02/2013  . Long term current use of anticoagulant therapy 08/21/2013  . Essential hypertension 04/01/2010    Resolved Hospital Problems   Diagnosis Date Noted Date Resolved  No resolved problems to display.    Discharge Condition: stable  Diet recommendation: heart healthy/carb modified  Filed Weights   01/14/16 2112 01/15/16 0525 01/16/16 0622  Weight: 119.614 kg (263 lb 11.2 oz) 119.205 kg (262 lb 12.8 oz) 118.434 kg (261 lb 1.6 oz)    History of present illness:  Patient is a 82 yo male with history of COPD on CPAP, left subclavian stenosis, CHF ( systolic, EF [id number] in [id number]), Afib s/p biventricular defibrillator, CAD s/p CABG, hypothyroidism, HTN, CVA who came with cc of dyspnea that has been  worsening for the past week. This is associated with wheezing and cough of clear sputum but not orthopnea or PND. This is also associated with bilateral LE edema but no chest pain. Patient denies preceding URI,fever or chills. He denies any N/V/D/C/abd pain/dysuria.   Hospital Course:  Principal Problem:   Acute respiratory failure with hypoxia (HCC) Active Problems:   Essential hypertension   Long term current use of anticoagulant therapy   Peripheral vascular disease (HCC)   PAF (paroxysmal atrial fibrillation) (HCC)   OSA (obstructive sleep apnea)   COPD exacerbation (HCC)   Acute on chronic systolic CHF (congestive heart failure), NYHA class 3 (HCC)   Demand ischemia (HCC)   Acute renal failure superimposed on stage 3 chronic kidney disease (HCC)   Diabetes mellitus with diabetic neuropathy, with long-term current use of insulin (HCC)   Morbid obesity due to excess calories (HCC)   Chronic systolic heart failure (HCC)  Acute respiratory failure with hypoxia - multifactorial and secondary to RLL PNA, Acute COPD, Acute on chronic systolic CHF -now on room air at rest, ambulatory oyxgen above 96% on room air  Acute COPD exacerbation  - reported productive cough and wheezing , sputum sample was not collected, add mucinex, continue nebs, abx, steroids Better, wheezing resolved at discharge, will discharge with augmentinx2 days and taper steroids, f/u with pulmonology  RLL PNA - on empiric abx - chest ct on 3/23 due to persistent wheezing and cough, no acute findings, + back ground emphysema  Discharge with augmentin to finish abx course  Acute on chronic systolic CHF (congestive heart failure), NYHA class 3 (  [place]) - weight 270 lbs on admission, discharge weight 118.4 on 3/24  - Cardiology following - further diuresis has been hampered by worsening renal failure Filed Weights   01/14/16 0300 01/14/16 2112 01/15/16 0525  Weight: 119.1 kg (262 lb 9.1 oz) 119.614 kg (263 lb 11.2  oz) 119.205 kg (262 lb 12.8 oz)   Acute renal failure superimposed on stage 3 chronic kidney disease cr at 1.85  At discharge, hold lasix on 3/25, resume on 3/26, repeat bmp next week at pmd follow up - home meds valsartan discontinued due to renal failure and tendency of hyperkalemia  Hyperkalemia Corrected   Acute encephalopathy, hospital delirium night of 3/19,  Appears resolved  Peripheral vascular disease  Continue medical therapy  PAF chronic, CHADS2 score 5-6 - rate controlled - continue Coumadin per pharmacy  INR 3.39 on 3/24, hold coumadin on 3/25, resume on 3/26, check inr on 3/27  Essential hypertension Blood pressure currently well controlled on coreg lasix, spironolactone, valsartan discontinued  OSA (obstructive sleep apnea) on cpap at night  Demand ischemia - trop mildly elevated, secondary to acute illness outlined above, cardiology consulted  Insulin dependent Diabetes mellitus with diabetic neuropathy, with long-term current use of insulin  - a1c 7.7 -pmd to continue close monitor blood sugar control and adjust insulin   Morbid obesity - Body mass index is 39.97 kg/(m^2).  Code Status: FULL Family Communication: patient in room Disposition Plan: home on 3/24  Consultants: [facility]   Procedures: none  Antibiotics: Zithromax 3/18 > Rocephin 3/19 >  DVT prophylaxis: warfarin        Discharge Exam: BP 129/71 mmHg  Pulse 82  Temp(Src) 97.7 F (36.5 C) (Oral)  Resp 24  Ht 5\' 8"  (1.727 m)  Wt 118.434 kg (261 lb 1.6 oz)  BMI 39.71 kg/m2  SpO2 91%  General: No acute respiratory distress at rest in chair, obese Lungs: improved air movement, wheezing has resolved, no rhonchi, no rales Cardiovascular: Regular rate without murmur gallop or rub normal S1 and S2 Abdomen: Nontender, protuberant/overweight, soft, bowel sounds positive, no rebound, no ascites, no appreciable mass Extremities: No significant cyanosis, or clubbing; trace  pitting edema bilateral lower extremities   Discharge Instructions You were cared for by a hospitalist during your hospital stay. If you have any questions about your discharge medications or the care you received while you were in the hospital after you are discharged, you can call the unit and asked to speak with the hospitalist on call if the hospitalist that took care of you is not available. Once you are discharged, your primary care physician will handle any further medical issues. Please note that NO REFILLS for any discharge medications will be authorized once you are discharged, as it is imperative that you return to your primary care physician (or establish a relationship with a primary care physician if you do not have one) for your aftercare needs so that they can reassess your need for medications and monitor your lab values.      Discharge Instructions    Diet - low sodium heart healthy    Complete by:  As directed   Carb modified     Increase activity slowly    Complete by:  As directed             Medication List    STOP taking these medications        valsartan 160 MG tablet  Commonly known as:  DIOVAN  TAKE these medications        albuterol 108 (90 Base) MCG/ACT inhaler  Commonly known as:  PROVENTIL HFA;VENTOLIN HFA  Inhale 2 puffs into the lungs every 6 (six) hours as needed for wheezing or shortness of breath. Must keep June 2016 appt.     albuterol (2.5 MG/3ML) 0.083% nebulizer solution  Commonly known as:  PROVENTIL  USE 1 VIAL VIA NEBULIZER 4 TIMES A DAY AS DIRECTED     amiodarone 200 MG tablet  Commonly known as:  PACERONE  TAKE 1 TABLET BY MOUTH EVERY DAY     amoxicillin-clavulanate 875-125 MG tablet  Commonly known as:  AUGMENTIN  Take 1 tablet by mouth 2 (two) times daily.     aspirin 81 MG tablet  Take 81 mg by mouth daily.     atorvastatin 80 MG tablet  Commonly known as:  LIPITOR  TAKE 1 TABLET (80 MG TOTAL) BY MOUTH DAILY.      carvedilol 6.25 MG tablet  Commonly known as:  COREG  TAKE 1 TABLET BY MOUTH TWICE A DAY     DULERA 100-5 MCG/ACT Aero  Generic drug:  mometasone-formoterol  TAKE 2 PUFFS BY MOUTH TWICE A DAY     furosemide 40 MG tablet  Commonly known as:  LASIX  Take 1 tablet (40 mg total) by mouth 2 (two) times daily. Please hold lasix on 3/25, restart on 3/26,     guaiFENesin 600 MG 12 hr tablet  Commonly known as:  MUCINEX  Take 1 tablet (600 mg total) by mouth 2 (two) times daily.     HYDROcodone-acetaminophen 5-325 MG tablet  Commonly known as:  NORCO/VICODIN  Take 1 tablet by mouth daily as needed for moderate pain.     insulin aspart 100 UNIT/ML injection  Commonly known as:  novoLOG  Inject 23 units into the skin before breakfast and lunch, inject 27units into the skin before supper     LANTUS SOLOSTAR 100 UNIT/ML injection  Generic drug:  insulin glargine  Inject 42 Units into the skin 2 (two) times daily.     levothyroxine 100 MCG tablet  Commonly known as:  SYNTHROID, LEVOTHROID  Take 100 mcg by mouth daily before breakfast.     multivitamin tablet  Take 1 tablet by mouth daily.     omeprazole 20 MG capsule  Commonly known as:  PRILOSEC  Take 20 mg by mouth daily.     predniSONE 10 MG (21) Tbpk tablet  Commonly known as:  STERAPRED UNI-PAK 21 TAB  Take 1 tablet (10 mg total) by mouth daily. Label  & dispense according to the schedule below. 6 Pills PO on day one, 5 Pills PO on day two, 4 Pills PO on day three, 3 Pills PO on day four, 2 Pills PO on day five, 1pill on day six,  then STOP.     senna-docusate 8.6-50 MG tablet  Commonly known as:  Senokot-S  Take 2 tablets by mouth at bedtime.     spironolactone 25 MG tablet  Commonly known as:  ALDACTONE  TAKE 1/2 TABLET (12.5 MG TOTAL) BY MOUTH DAILY.     traMADol 50 MG tablet  Commonly known as:  ULTRAM  Take 1 tablet by mouth daily as needed (pain).     vitamin B-12 1000 MCG tablet  Commonly known as:   CYANOCOBALAMIN  Take 1,000 mcg by mouth daily.     warfarin 5 MG tablet  Commonly known as:  COUMADIN  Take 5mg  everyday  except on Tuesday and Thursdays take 7.5mg  Please hold coumadin on 3/25, restart coumadin on 3/26, check INR on Monday 3/27.     ZETIA 10 MG tablet  Generic drug:  ezetimibe  TAKE 1 TABLET BY MOUTH EVERY DAY       No Known Allergies Follow-up Information    Follow up with [facility] [place] R, NP On 01/29/2016.   Specialties:  Cardiology, Radiology   Why:  the office will call you wtih an appointment time   Contact information:   [place] [address] [phone]       Follow up with Irven Shelling, MD In 1 week.   Specialty:  Internal Medicine   Why:  hospital discharge follow up, repeat cbc/bmp at follow up   Contact information:   [address] [place] [address] [phone]       Follow up with Chesley Mires, MD. Go on 01/23/2016.   Specialty:  Pulmonary Disease   Why:  copd @9 :00am with Eric Form   Contact information:   [address] [place] [address] [phone]        The results of significant diagnostics from this hospitalization (including imaging, microbiology, ancillary and laboratory) are listed below for reference.    Significant Diagnostic Studies: Ct Chest Wo Contrast  01/15/2016  CLINICAL DATA:  82 year old male with cough. History of diabetes, CHF, and AFib. EXAM: CT CHEST WITHOUT CONTRAST TECHNIQUE: Multidetector CT imaging of the chest was performed following the standard protocol without IV contrast. COMPARISON:  Chest radiograph dated 01/10/2016 and chest CT dated 01/17/2014 whole FINDINGS: Evaluation of this exam is limited in the absence of intravenous contrast. CT there is emphysematous changes of the lungs. There is a patchy area of interstitial prominence in the right upper lobe similar to prior study most compatible with scarring. There is no focal consolidation, pleural  effusion, or pneumothorax. The central airways are patent. There is atherosclerotic calcification of the thoracic aorta. No thoracic aortic aneurysm. The central pulmonary vasculature and appear grossly unremarkable. There is mild cardiomegaly. Cardiac pacemaker wires and CABG vascular clips noted. No pericardial effusion. There is no hilar or mediastinal adenopathy. The esophagus and the thyroid gland appear grossly unremarkable. There is no axillary adenopathy. Left pectoral pacemaker device noted. The chest wall soft tissues are otherwise unremarkable. There is median sternotomy wires. There is multilevel degenerative changes of the spine. Old healed bilateral rib fractures noted. Is no acute fracture identified. There is a stone at the neck of the gallbladder. The visualized upper abdomen is otherwise unremarkable. IMPRESSION: No acute intrathoracic pathology. Emphysema. Electronically Signed   By: Anner Crete M.D.   On: 01/15/2016 21:51   Dg Chest Port 1 View  01/10/2016  CLINICAL DATA:  RESP DISTRESS, COPD, DM, AFIB EXAM: PORTABLE CHEST 1 VIEW COMPARISON:  01/17/2014, 09/02/2011 FINDINGS: Status post median sternotomy. Left-sided AICD lead overlies the right atrium. The heart is enlarged. There is increased pulmonary opacity bilaterally, right greater than left, consistent with edema and/or infectious process. Lung bases are excluded. IMPRESSION: 1. Cardiomegaly. 2. Suspect interstitial edema. Possible superimposed infectious infiltrate in the right lung. Electronically Signed   By: Nolon Nations M.D.   On: 01/10/2016 22:16    Microbiology: Recent Results (from the past 240 hour(s))  MRSA PCR Screening     Status: None   Collection Time: 01/11/16 12:10 AM  Result Value Ref Range Status   MRSA by PCR NEGATIVE NEGATIVE Final  Comment:        The GeneXpert MRSA Assay (FDA approved for NASAL specimens only), is one component of a comprehensive MRSA colonization surveillance program. It  is not intended to diagnose MRSA infection nor to guide or monitor treatment for MRSA infections.      Labs: Basic Metabolic Panel:  Recent Labs Lab 01/12/16 0445 01/13/16 0216 01/14/16 0300 01/15/16 0250 01/16/16 0430  NA 137 137 134* 138 140  K 4.7 5.2* 4.7 4.8 5.1  CL 100* 99* 98* 100* 102  CO2 28 29 30 28 30   GLUCOSE 349* 399* 276* 224* 114*  BUN 38* 51* 50* 56* 60*  CREATININE 1.72* 1.73* 1.49* 1.55* 1.85*  CALCIUM 8.4* 8.6* 8.6* 8.5* 8.4*   Liver Function Tests:  Recent Labs Lab 01/10/16 2131 01/11/16 0430  AST 33 29  ALT 24 25  ALKPHOS 65 70  BILITOT 0.2* 0.4  PROT 6.2* 6.5  ALBUMIN 3.3* 3.3*   No results for input(s): LIPASE, AMYLASE in the last 168 hours. No results for input(s): AMMONIA in the last 168 hours. CBC:  Recent Labs Lab 01/10/16 2131 01/12/16 0445 01/13/16 0216 01/14/16 0300 01/15/16 0250 01/16/16 0430  WBC 7.1 12.5* 12.1* 14.0* 13.9* 11.6*  NEUTROABS 4.6  --   --   --   --   --   HGB 10.0* 10.1* 10.7* 10.9* 11.3* 11.2*  HCT 32.6* 33.3* 35.1* 35.2* 35.7* 36.4*  MCV 83.8 83.9 82.8 82.2 82.1 82.4  PLT 195 194 230 228 223 236   Cardiac Enzymes:  Recent Labs Lab 01/11/16 0430  TROPONINI 0.04*   BNP: BNP (last 3 results)  Recent Labs  01/10/16 2131  BNP 532.2*    ProBNP (last 3 results) No results for input(s): PROBNP in the last 8760 hours.  CBG:  Recent Labs Lab 01/15/16 1112 01/15/16 1607 01/15/16 2124 01/15/16 2154 01/16/16 0548  GLUCAP 153* 73 42* 79 171*       Signed:  Aubre Quincy MD, PhD  [facility] 01/16/2016, 11:48 AM

## 2016-01-16 NOTE — Care Management Important Message (Signed)
Important Message  Patient Details  Name: [name] MRN: [id number] Date of Birth: [birth year]   Medicare Important Message Given:  Yes    [name] P [place] 01/16/2016, 12:35 PM

## 2016-01-16 NOTE — Discharge Summary (Signed)
Pt got discharged to home, discharge instructions provided and patient showed understanding to it, IV taken out,Telemonitor DC,pt left unit in wheelchair with all of the belongings accompanied with a family member (wife) 

## 2016-01-16 NOTE — Progress Notes (Signed)
Physical Therapy Treatment Patient Details Name: [name] MRN: [id number] DOB: 01-05-1934 Today's Date: 01/16/2016    History of Present Illness 82 yo male with history of COPD on CPAP, left subclavian stenosis, CHF (systolic, EF [id number] in [id number]), Afib s/p biventricular defibrillator, CAD s/p CABG, hypothyroidism, HTN, CVA who presented to [facility] ED with main concern of several days duration of progressively worsening dyspnea that initially started with exertion and has progressed to dyspnea at rest, associated with LE swelling, 2-3 pillow orthopnea, wheezing, mixed non productive and productive cough of clear sputum, poor oral intake and malaise.    PT Comments    Pt presents with functional limitations due to decreased balance and decreased cardiorespiratory endurance. Pt has moderate instability without use of RW with ambulation evidenced by his inability to accept balance challenges. Pt Scored an 11/24 on the Dynamic gait index without the use of an AD and gets very SOB without use of an AD. Encouraged pt to use RW at home until his endurance and balance improves, pt agreed. Pt will continue to benefit from PT services while in the hospital to continue to improve independence with functional mobility. Pt would also benefit from Outpatient PT services to continue to improve balance and endurance when discharged from the hospital.    Follow Up Recommendations  Supervision - Intermittent;Outpatient PT     Equipment Recommendations  None recommended by PT    Recommendations for Other Services       Precautions / Restrictions Precautions Precautions: Fall Restrictions Weight Bearing Restrictions: No    Mobility  Bed Mobility               General bed mobility comments: pt in chair upon entry to room  Transfers Overall transfer level: Independent Equipment used: None Transfers: Sit to/from Stand Sit to Stand: Independent         General transfer comment: Pt able to  stand from recliner, commode, and standard chair independently without use of UE's, no LOB, and no AD.   Ambulation/Gait Ambulation/Gait assistance: Supervision Ambulation Distance (Feet): 250 Feet (150+100) Assistive device: Rolling walker (2 wheeled);None Gait Pattern/deviations: Step-through pattern;Decreased stride length;Trunk flexed;Wide base of support Gait velocity: WFL Gait velocity interpretation: at or above normal speed for age/gender General Gait Details: Pt able to ambulate with out RW but has moderate instability marked by significantly decreased step length and inability to accept challenges in balance. Pt ambualtes well with RW and states that he has a rollator at home that he likes to use better.    Stairs Stairs: Yes Stairs assistance: Supervision Stair Management: One rail Right;Step to pattern;Forwards Number of Stairs: 5 General stair comments: VC's to take his time and to only do one step at a time for safety. no LOB.   Wheelchair Mobility    Modified Rankin (Stroke Patients Only)       Balance Overall balance assessment: Needs assistance Sitting-balance support: No upper extremity supported;Feet supported Sitting balance-Leahy Scale: Normal     Standing balance support: No upper extremity supported Standing balance-Leahy Scale: Fair Standing balance comment: Moderate instability with no AD with dynamic balance.              High level balance activites: Turns;Sudden stops;Head turns;Direction changes High Level Balance Comments: Unable to accept balance challenges without use of AD.     Cognition Arousal/Alertness: Awake/alert Behavior During Therapy: WFL for tasks assessed/performed Overall Cognitive Status: Within Functional Limits for tasks assessed  Exercises Other Exercises Other Exercises: 10 sit to stand from standard chair without use of UE's     General Comments General comments (skin integrity, edema,  etc.): Educated pt the importance of taking his time, especially when going up/down steps and when he is out in the yard at home. Also encouraged pt to use rollator at home for a while until his balance is better.       Pertinent Vitals/Pain Pain Assessment: No/denies pain. Vitals stable throughout activity. SaO2 95% with activity, HR: 101 with activity.     Home Living                      Prior Function            PT Goals (current goals can now be found in the care plan section) Acute Rehab PT Goals Patient Stated Goal: to go home PT Goal Formulation: With patient Time For Goal Achievement: 01/26/16 Potential to Achieve Goals: Good Progress towards PT goals: Progressing toward goals    Frequency  Min 3X/week    PT Plan Current plan remains appropriate    Co-evaluation             End of Session Equipment Utilized During Treatment: Gait belt Activity Tolerance: Patient tolerated treatment well Patient left: in chair;with call bell/phone within reach     Time: 0847-0901 PT Time Calculation (min) (ACUTE ONLY): 14 min  Charges:  $Gait Training: 8-22 mins                    G Codes:      Colon Branch, SPT Colon Branch 01/16/2016, 9:54 AM

## 2016-01-16 NOTE — Progress Notes (Signed)
SATURATION QUALIFICATIONS: (This note is used to comply with regulatory documentation for home oxygen)  Patient Saturations on Room Air at Rest = 98%  Patient Saturations on Room Air while Ambulating = 96%   

## 2016-01-16 NOTE — Telephone Encounter (Signed)
Pt currently still in hospital.

## 2016-01-16 NOTE — Progress Notes (Addendum)
Patient qualifies for the [place] program for COPD/ Pneumonia; referral placed; B Pennie Rushing [phone]

## 2016-01-16 NOTE — Progress Notes (Signed)
[place] for warfarin Indication: atrial fibrillation  No Known Allergies  Patient Measurements: Height: 5\' 8"  (172.7 cm) Weight: 261 lb 1.6 oz (118.434 kg) (a scale) IBW/kg (Calculated) : 68.4   Vital Signs: Temp: 97.7 F (36.5 C) (03/24 0951) Temp Source: Oral (03/24 0951) BP: 129/71 mmHg (03/24 0951) Pulse Rate: 82 (03/24 0951)  Labs:  Recent Labs  01/14/16 0300 01/15/16 0250 01/16/16 0430  HGB 10.9* 11.3* 11.2*  HCT 35.2* 35.7* 36.4*  PLT 228 223 236  LABPROT 28.6* 33.8* 33.6*  INR 2.74* 3.42* 3.39*  CREATININE 1.49* 1.55* 1.85*    Estimated Creatinine Clearance: 39.8 mL/min (by C-G formula based on Cr of 1.85).   Assessment: 66 YOM with AFib on warfarin PTA. Home dose was 5mg  daily except 7.5mg  on Tuesdays and Thursdays.  INR 3.39 this morning. Suspect increased yesterday is from antibiotics.  Hgb 11.2, plts 236- stable, no bleeding noted.  Goal of Therapy:  INR 2-3 Monitor platelets by anticoagulation protocol: Yes   Plan:  -hold warfarin tonight -daily INR -follow CBC and s/s bleeding along with LOT of abx  Oliva Montecalvo D. Jonluke Cobbins, PharmD, BCPS Clinical Pharmacist Pager: [phone] 01/16/2016 10:06 AM

## 2016-01-16 NOTE — Progress Notes (Signed)
Pt placed self on CPAP for the night. Informed pt if he had any problem to please have RN call this RT.

## 2016-01-19 ENCOUNTER — Ambulatory Visit (INDEPENDENT_AMBULATORY_CARE_PROVIDER_SITE_OTHER): Payer: Medicare Other | Admitting: Pharmacist

## 2016-01-19 DIAGNOSIS — Z7901 Long term (current) use of anticoagulants: Secondary | ICD-10-CM

## 2016-01-19 DIAGNOSIS — I480 Paroxysmal atrial fibrillation: Secondary | ICD-10-CM | POA: Diagnosis not present

## 2016-01-19 DIAGNOSIS — I4891 Unspecified atrial fibrillation: Secondary | ICD-10-CM

## 2016-01-19 LAB — POCT INR: INR: 2.5

## 2016-01-19 NOTE — Telephone Encounter (Signed)
Left message to call back  

## 2016-01-20 DIAGNOSIS — N183 Chronic kidney disease, stage 3 (moderate): Secondary | ICD-10-CM | POA: Diagnosis not present

## 2016-01-20 DIAGNOSIS — I5022 Chronic systolic (congestive) heart failure: Secondary | ICD-10-CM | POA: Diagnosis not present

## 2016-01-20 DIAGNOSIS — Z794 Long term (current) use of insulin: Secondary | ICD-10-CM | POA: Diagnosis not present

## 2016-01-20 DIAGNOSIS — E039 Hypothyroidism, unspecified: Secondary | ICD-10-CM | POA: Diagnosis not present

## 2016-01-20 DIAGNOSIS — E1122 Type 2 diabetes mellitus with diabetic chronic kidney disease: Secondary | ICD-10-CM | POA: Diagnosis not present

## 2016-01-20 DIAGNOSIS — J441 Chronic obstructive pulmonary disease with (acute) exacerbation: Secondary | ICD-10-CM | POA: Diagnosis not present

## 2016-01-20 DIAGNOSIS — Z7901 Long term (current) use of anticoagulants: Secondary | ICD-10-CM | POA: Diagnosis not present

## 2016-01-20 NOTE — Telephone Encounter (Signed)
Fu  Pt wife returning TCM phone call from 3/27. Please call back and discuss.

## 2016-01-20 NOTE — Telephone Encounter (Signed)
Patient contacted regarding discharge from [facility] on 01/19/16   Patient understands to follow up with provider L.[place] NP on 01/29/16 at [place] at [facility] [address]. Patient understands discharge instructions? yes Patient understands medications and regiment? yes Patient understands to bring all medications to this visit? yes  Patient is seeing pulmonologist today for wheezing and having lab work done per wife.

## 2016-01-22 ENCOUNTER — Ambulatory Visit (INDEPENDENT_AMBULATORY_CARE_PROVIDER_SITE_OTHER): Payer: Medicare Other

## 2016-01-22 ENCOUNTER — Telehealth: Payer: Self-pay | Admitting: Cardiology

## 2016-01-22 DIAGNOSIS — Z95810 Presence of automatic (implantable) cardiac defibrillator: Secondary | ICD-10-CM

## 2016-01-22 DIAGNOSIS — I5022 Chronic systolic (congestive) heart failure: Secondary | ICD-10-CM

## 2016-01-22 NOTE — Progress Notes (Signed)
EPIC Encounter for ICM Monitoring  Patient Name: [name] is a 82 y.o. male Date: 01/22/2016 Primary Care Physican: Irven Shelling, MD Primary Cardiologist: Irish Lack Electrophysiologist: Allred Dry Weight: 256 lbs   Bi-V Pacing 97.9%      In the past month, have you:  1. Gained more than 2 pounds in a day or more than 5 pounds in a week? no  2. Had changes in your medications (with verification of current medications)? no  3. Had more shortness of breath than is usual for you? no  4. Limited your activity because of shortness of breath? no  5. Not been able to sleep because of shortness of breath? no  6. Had increased swelling in your feet or ankles? no  7. Had symptoms of dehydration (dizziness, dry mouth, increased thirst, decreased urine output) no  8. Had changes in sodium restriction? no  9. Been compliant with medication? Yes   ICM trend: 3 month view for 01/22/2016   ICM trend: 1 year view for 01/22/2016   Follow-up plan: ICM clinic phone appointment on 02/05/2016.  Spoke with wife, [name].   Thoracic impedance trending along reference line since 01/10/2016 and following post hospitalization discharge on 01/16/2016.  She reported patient is doing better since discharge but does still have some wheezing.  She stated he went to ER due to wheezing and had pneumonia as well.   She denied any fluid symptoms at this time, no leg swelling.    Education given to limit sodium intake to < 2000 mg and fluid intake to 64 oz daily.   Education given on importance of medication compliance.  Encouraged to call for any fluid symptoms.  No changes today.   Appointments:  01/23/2016 Pulmonology                           01/29/2016 Cecilie Kicks, NP   Copy of note sent to patient's primary care physician, primary cardiologist, and device following physician.  Rosalene Billings, RN, CCM 01/22/2016 12:04 PM

## 2016-01-22 NOTE — Telephone Encounter (Signed)
LMOVM reminding pt to send remote transmission.   

## 2016-01-23 ENCOUNTER — Ambulatory Visit (INDEPENDENT_AMBULATORY_CARE_PROVIDER_SITE_OTHER): Payer: Medicare Other | Admitting: Acute Care

## 2016-01-23 ENCOUNTER — Encounter: Payer: Self-pay | Admitting: Acute Care

## 2016-01-23 VITALS — BP 122/82 | HR 88 | Temp 97.8°F | Ht 68.0 in | Wt 264.2 lb

## 2016-01-23 DIAGNOSIS — J9601 Acute respiratory failure with hypoxia: Secondary | ICD-10-CM | POA: Diagnosis not present

## 2016-01-23 DIAGNOSIS — Z5181 Encounter for therapeutic drug level monitoring: Secondary | ICD-10-CM | POA: Diagnosis not present

## 2016-01-23 DIAGNOSIS — I255 Ischemic cardiomyopathy: Secondary | ICD-10-CM

## 2016-01-23 DIAGNOSIS — G4733 Obstructive sleep apnea (adult) (pediatric): Secondary | ICD-10-CM | POA: Diagnosis not present

## 2016-01-23 MED ORDER — PREDNISONE 10 MG PO TABS: ORAL_TABLET | ORAL | Status: DC

## 2016-01-23 NOTE — Assessment & Plan Note (Signed)
Patient states he is compliant with his CPAP, and benefiting from therapy.  Plan Continue on CPAP at bedtime. You appear to be benefiting from the treatment Goal is to wear for at least 4-6 hours each night for maximal clinical benefit. Continue to work on weight loss, as the link between excess weight  and sleep apnea is well established.  Do not drive if sleepy. Follow-up with the office for any change in daytime alertness, or for any CPAP needs.

## 2016-01-23 NOTE — Assessment & Plan Note (Signed)
Recent hospitalization for multifactorial acute restaurant failure with hypoxia. Resolving Plan:  We will call in a Prednisone taper; 10 mg tablets: 4 tabs x 2 days, 3 tabs x 2 days, 2 tabs x 2 days 1 tab x 2 days then stop. Monitor you blood sugars closely. Continue your Dulera 2 puffs in the morning and 2 puffs in the evening. Continue your albuterol and Proventil nebs. Continue using your Pro Air inhaler every 6 hours as needed for shortness of breath Continue weighing every day and call your cardiologist  for weight gain as they have instructed. Follow up in 2 weeks to make sure you are continuing to improve. Consider 6 minute walk when you are back to your baseline to assess need for home oxygen with exertion. Please contact office for sooner follow up if symptoms do not improve or worsen or seek emergency care

## 2016-01-23 NOTE — Patient Instructions (Addendum)
It is nice to meet you today. I am glad you are feeling better. We will call in a Prednisone taper; 10 mg tablets: 4 tabs x 2 days, 3 tabs x 2 days, 2 tabs x 2 days 1 tab x 2 days then stop. Monitor you blood sugars closely. Continue your Dulera 2 puffs in the morning and 2 puffs in the evening. Continue your albuterol and Proventil nebs. Continue using your Pro Air inhaler every 6 hours as needed for shortness of breath Continue weighing every day and call your cardiologist  for weight gain as they have instructed. Follow up in 2 weeks to make sure you are continuing to improve. Continue wearing your CPAP at night  Goal is to wear for 6 hours each night. Consider 6 minute walk when you are back to your baseline to assess for home oxygen with exertion. Please contact office for sooner follow up if symptoms do not improve or worsen or seek emergency care

## 2016-01-23 NOTE — Progress Notes (Signed)
Subjective:    Patient ID: [name], male    DOB: May 31, 1934, 82 y.o.   MRN: [id number]  HPI Patient is a 82 yo male with history of COPD on CPAP, left subclavian stenosis, CHF ( systolic, EF [id number] in [id number]), Afib s/p biventricular defibrillator, CAD s/p CABG, hypothyroidism, HTN, CVA   Recent hospitalization for multi-factorial acute respiratory failure with Hypoxia/ Acute COPD Exacerbation/ right lower lobe pneumonia. Treated with azithromycin and Augmentin. Significant events/procedures:  Recent hospitalization for multifactorial acute respiratory failure with hypoxia.  Admit date: 01/10/2016 Discharge date: 01/16/2016  01/11/2016 2-D echocardiogram EF: 35-40%: Pulmonary arteries: PA peak pressure: 23 mm Hg (S).  01/15/2016 CT chest without contrast:  CT there is emphysematous changes of the lungs. There is a patchy area of interstitial prominence in the right upper lobe similar to prior study most compatible with scarring. There is no focal consolidation, pleural effusion, or pneumothorax. The central airways are patent.   IMPRESSION: No acute intrathoracic pathology.  Emphysema.   01/23/16: Hospital Follow Up Office Visit: Patient presents to the office today .[name] presents to the office today, he states he feels better. Oxygen saturation 98% on room air. He does continue to have fatigue. He continues to have expiratory wheezing. He is using his albuterol and Proventil nebulizers approximately 4 times daily.[name] He has completed his Augmentin, and prednisone taper. He has been seen by his primary care physician Dr. [name] for labs as requested and discharge summary. He continues to use his CPAP nightly. He is weighing himself daily.  Lasix dosing and creatinine monitoring are being managed by cardiology. Patient has 2+ pitting edema to bilateral lower extremities. INR 01/19/2016 was at goal at 2.5. He denies fever, chest pain, hemoptysis, or  orthopnea.   Current outpatient prescriptions:  .  albuterol (PROVENTIL HFA;VENTOLIN HFA) 108 (90 BASE) MCG/ACT inhaler, Inhale 2 puffs into the lungs every 6 (six) hours as needed for wheezing or shortness of breath. Must keep June 2016 appt., Disp: 1 Inhaler, Rfl: 0 .  albuterol (PROVENTIL) (2.5 MG/3ML) 0.083% nebulizer solution, USE 1 VIAL VIA NEBULIZER 4 TIMES A DAY AS DIRECTED (Patient taking differently: USE 1 VIAL VIA NEBULIZER 2 TIMES A DAY AS DIRECTED), Disp: 300 mL, Rfl: 2 .  amiodarone (PACERONE) 200 MG tablet, TAKE 1 TABLET BY MOUTH EVERY DAY, Disp: 30 tablet, Rfl: 9 .  aspirin 81 MG tablet, Take 81 mg by mouth daily.  , Disp: , Rfl:  .  atorvastatin (LIPITOR) 80 MG tablet, TAKE 1 TABLET (80 MG TOTAL) BY MOUTH DAILY., Disp: 90 tablet, Rfl: 3 .  carvedilol (COREG) 6.25 MG tablet, TAKE 1 TABLET BY MOUTH TWICE A DAY, Disp: 180 tablet, Rfl: 3 .  DULERA 100-5 MCG/ACT AERO, TAKE 2 PUFFS BY MOUTH TWICE A DAY, Disp: 1 Inhaler, Rfl: 6 .  furosemide (LASIX) 40 MG tablet, Take 1 tablet (40 mg total) by mouth 2 (two) times daily. Please hold lasix on 3/25, restart on 3/26,, Disp: 30 tablet, Rfl: 0 .  guaiFENesin (MUCINEX) 600 MG 12 hr tablet, Take 1 tablet (600 mg total) by mouth 2 (two) times daily., Disp: 30 tablet, Rfl: 0 .  HYDROcodone-acetaminophen (NORCO/VICODIN) 5-325 MG per tablet, Take 1 tablet by mouth daily as needed for moderate pain. , Disp: , Rfl:  .  insulin aspart (NOVOLOG) 100 UNIT/ML injection, Inject 23 units into the skin before breakfast and lunch, inject 27units into the skin before supper, Disp: , Rfl:  .  insulin glargine (LANTUS  SOLOSTAR) 100 UNIT/ML injection, Inject 42 Units into the skin 2 (two) times daily. , Disp: , Rfl:  .  levothyroxine (SYNTHROID, LEVOTHROID) 100 MCG tablet, Take 100 mcg by mouth daily before breakfast. , Disp: , Rfl:  .  Multiple Vitamin (MULTIVITAMIN) tablet, Take 1 tablet by mouth daily.  , Disp: , Rfl:  .  omeprazole (PRILOSEC) 20 MG capsule,  Take 20 mg by mouth daily.  , Disp: , Rfl:  .  senna-docusate (SENOKOT-S) 8.6-50 MG tablet, Take 2 tablets by mouth at bedtime., Disp: 30 tablet, Rfl: 0 .  spironolactone (ALDACTONE) 25 MG tablet, TAKE 1/2 TABLET (12.5 MG TOTAL) BY MOUTH DAILY., Disp: 45 tablet, Rfl: 10 .  traMADol (ULTRAM) 50 MG tablet, Take 1 tablet by mouth daily as needed (pain). , Disp: , Rfl:  .  vitamin B-12 (CYANOCOBALAMIN) 1000 MCG tablet, Take 1,000 mcg by mouth daily., Disp: , Rfl:  .  warfarin (COUMADIN) 5 MG tablet, Take 5mg  everyday except on Tuesday and Thursdays take 7.5mg  Please hold coumadin on 3/25, restart coumadin on 3/26, check INR on Monday 3/27., Disp: 40 tablet, Rfl: 3 .  ZETIA 10 MG tablet, TAKE 1 TABLET BY MOUTH EVERY DAY, Disp: 90 tablet, Rfl: 1 .  predniSONE (DELTASONE) 10 MG tablet, Take 4 tabs po x 2 days, then 3 x 2 days, then 2 x 2 days, then 1 x 2 days then stop., Disp: 20 tablet, Rfl: 0   Past Medical History  Diagnosis Date  . Dyspnea   . OSA (obstructive sleep apnea)   . HTN (hypertension)   . DM (diabetes mellitus), type 2 ([place])   . Hyperlipidemia   . Systolic congestive heart failure ([place]) 2009    s/p BiV ICD implantation by Dr Leonia Reeves (MDT)  . Atrial fibrillation ([place])     persistent, previously seen at [facility] and placed on amiodarone  . Hypothyroidism   . PNA (pneumonia)   . Cleft palate   . Nasal septal deviation   . Iron deficiency anemia   . GERD (gastroesophageal reflux disease)   . Benign prostatic hypertrophy   . Nephrolithiasis   . Psoriasis   . Seborrheic keratosis   . COPD with emphysema ([place]) 04/01/2010  . CAD (coronary artery disease)     multivessel s/p inferolateral wall MI with subsequent CABG 11/1998.  Cath 2009 with Patent grafts  . Ischemic dilated cardiomyopathy     EF 35-40% by MUGA 6/11  . PAF (paroxysmal atrial fibrillation) ([place])   . Peripheral arterial disease (HCC)     left subclavian artery stenosis  . Stroke ([place])   . Myocardial infarction ([place])      No Known Allergies   Review of Systems Constitutional:   No  weight loss, night sweats,  Fevers, chills,+ fatigue, or  lassitude.  HEENT:   No headaches,  Difficulty swallowing,  Tooth/dental problems, or  Sore throat,                No sneezing, itching, ear ache, nasal congestion, post nasal drip,   CV:  No chest pain,  Orthopnea, PND,+ swelling in lower extremities, anasarca, dizziness, palpitations, syncope.   GI  No heartburn, indigestion, abdominal pain, nausea, vomiting, diarrhea, change in bowel habits, loss of appetite, bloody stools.   Resp: + shortness of breath with exertion not at rest.  No excess mucus, no productive cough,  No non-productive cough,  No coughing up of blood.  No change in color of mucus.  + wheezing.  No chest wall deformity  Skin: no rash or lesions.  GU: no dysuria, change in color of urine, no urgency or frequency.  No flank pain, no hematuria   MS:  No joint pain or swelling.  No decreased range of motion.  No back pain.  Psych:  No change in mood or affect. No depression or anxiety.  No memory loss.        Objective:   Physical Exam  BP 122/82 mmHg  Pulse 88  Temp(Src) 97.8 F (36.6 C) (Oral)  Ht 5\' 8"  (1.727 m)  Wt 264 lb 3.2 oz (119.84 kg)  BMI 40.18 kg/m2  SpO2 98%  Physical Exam:  General- No distress,  A&Ox3, overweight elderly male ENT: No sinus tenderness, TM clear, pale nasal mucosa, no oral exudate,no post nasal drip, no LAN Cardiac: S1, S2, regular rate and rhythm, no murmur Chest: + Exp wheeze/ No rales/ dullness; no accessory muscle use, no nasal flaring, no sternal retractions Abd.: Soft Non-tender Ext: No clubbing cyanosis, 2+ pitting edema bilaterally lower extremities Neuro:  normal strength, not at baseline Skin: No rashes, warm and dry Psych: normal mood and behavior   Magdalen Spatz, AGACNP-BC [place] Pager # [phone] 01/23/2016     Assessment & Plan:

## 2016-01-24 ENCOUNTER — Other Ambulatory Visit: Payer: Self-pay | Admitting: Pulmonary Disease

## 2016-01-26 NOTE — Progress Notes (Signed)
Reviewed and agree with assessment/plan. 

## 2016-01-28 NOTE — Progress Notes (Signed)
Cardiology Office Note   Date:  01/29/2016   ID:  PERRI FRANCKOWIAK, DOB 02-10-1934, MRN [id number]  PCP:  Irven Shelling, MD  Cardiologist:  [name]     Chief Complaint  Patient presents with  . Hospitalization Follow-up      History of Present Illness: WYN FLAM is a 82 y.o. male who presents for post hospitalization.    He has a history of COPD on CPAP, left subclavian stenosis, CHF ( systolic, EF [id number] in [id number]), Afib s/p biventricular defibrillator, CAD s/p CABG, hypothyroidism, HTN, CVA who came with cc of dyspnea that has been worsening for the past week. This is associated with wheezing and cough of clear sputum but not orthopnea or PND. This is also associated with bilateral LE edema but no chest pain. Patient denies preceding URI,fever or chills. He denies any N/V/D/C/abd pain/dysuria.   Admitted 01/16/16 with acute resp. Failure with hypoxia acute COPD exacerbation RLL PNA, and acute systolic HF.Chronic systolic CHF: EF 123456 -Weight stable at 262 (down from 270 recorded on admission  Paroxsymal Afib At discharge: - Currently in AV paced rhythm on amiodarone. - CHADSVASC of 8, A/C with warfarin, theraputic AoCKD3- improving - ARB held,  -Resumed home oral lasix 40mg  BID   Today doing better but still with wheezes, he was placed on steroids decreasing doses and is improving,  He has not yet had lasix today and BP  elevated  But at Pulmonary visit was 122/82 and some lower ext edema.  No chest pain. His sp02 is 95%. Has cut out salt and is doing well on his diet and is weighing most days. .      Past Medical History  Diagnosis Date  . Dyspnea   . OSA (obstructive sleep apnea)   . HTN (hypertension)   . DM (diabetes mellitus), type 2 ([place])   . Hyperlipidemia   . Systolic congestive heart failure ([place]) 2009    s/p BiV ICD implantation by Dr Leonia Reeves (MDT)  . Atrial fibrillation ([place])     persistent, previously seen at [facility] and placed on  amiodarone  . Hypothyroidism   . PNA (pneumonia)   . Cleft palate   . Nasal septal deviation   . Iron deficiency anemia   . GERD (gastroesophageal reflux disease)   . Benign prostatic hypertrophy   . Nephrolithiasis   . Psoriasis   . Seborrheic keratosis   . COPD with emphysema ([place]) 04/01/2010  . CAD (coronary artery disease)     multivessel s/p inferolateral wall MI with subsequent CABG 11/1998.  Cath 2009 with Patent grafts  . Ischemic dilated cardiomyopathy     EF 35-40% by MUGA 6/11  . PAF (paroxysmal atrial fibrillation) ([place])   . Peripheral arterial disease (HCC)     left subclavian artery stenosis  . Stroke ([place])   . Myocardial infarction [facility])     Past Surgical History  Procedure Laterality Date  . Carpal tunnel release    . Left cleft palate and left cleft lip repair    . Cataract extraction    . C-spine surgery    . Bi-ventricular implantable cardioverter defibrillator  (crt-d)  10-08-08; 11-06-2013    Dr Leonia Reeves (MDT) implant for primary prevention; gen change to MDT VivaXT CRTD by Dr Rayann Heman  . Coronary artery bypass graft      LIMA to LAD, SVG to OM, SVG to diagonal  . Biv icd genertaor change out N/A 11/06/2013    Procedure: BIV ICD GENERTAOR  CHANGE OUT;  Surgeon: Coralyn Mark, MD;  Location: [facility] CATH LAB;  Service: Cardiovascular;  Laterality: N/A;     Current Outpatient Prescriptions  Medication Sig Dispense Refill  . albuterol (PROVENTIL HFA;VENTOLIN HFA) 108 (90 BASE) MCG/ACT inhaler Inhale 2 puffs into the lungs every 6 (six) hours as needed for wheezing or shortness of breath. Must keep June 2016 appt. 1 Inhaler 0  . albuterol (PROVENTIL) (2.5 MG/3ML) 0.083% nebulizer solution USE 1 VIAL VIA NEBULIZER 4 TIMES A DAY AS DIRECTED 300 mL 2  . amiodarone (PACERONE) 200 MG tablet TAKE 1 TABLET BY MOUTH EVERY DAY 30 tablet 9  . aspirin 81 MG tablet Take 81 mg by mouth daily.      [name] atorvastatin (LIPITOR) 80 MG tablet TAKE 1 TABLET (80 MG TOTAL) BY MOUTH DAILY. 90  tablet 3  . carvedilol (COREG) 6.25 MG tablet TAKE 1 TABLET BY MOUTH TWICE A DAY 180 tablet 3  . DULERA 100-5 MCG/ACT AERO TAKE 2 PUFFS BY MOUTH TWICE A DAY 1 Inhaler 6  . furosemide (LASIX) 40 MG tablet Take 1 tablet (40 mg total) by mouth 2 (two) times daily. Please hold lasix on 3/25, restart on 3/26, 30 tablet 0  . guaiFENesin (MUCINEX) 600 MG 12 hr tablet Take 1 tablet (600 mg total) by mouth 2 (two) times daily. 30 tablet 0  . HYDROcodone-acetaminophen (NORCO/VICODIN) 5-325 MG per tablet Take 1 tablet by mouth daily as needed for moderate pain.     [name] insulin aspart (NOVOLOG) 100 UNIT/ML injection Inject 23 units into the skin before breakfast and lunch, inject 27units into the skin before supper    . insulin glargine (LANTUS SOLOSTAR) 100 UNIT/ML injection Inject 42 Units into the skin 2 (two) times daily.     [name] levothyroxine (SYNTHROID, LEVOTHROID) 100 MCG tablet Take 100 mcg by mouth daily before breakfast.     . Multiple Vitamin (MULTIVITAMIN) tablet Take 1 tablet by mouth daily.      [name] omeprazole (PRILOSEC) 20 MG capsule Take 20 mg by mouth daily.      . predniSONE (DELTASONE) 10 MG tablet Take 4 tabs po x 2 days, then 3 x 2 days, then 2 x 2 days, then 1 x 2 days then stop. 20 tablet 0  . senna-docusate (SENOKOT-S) 8.6-50 MG tablet Take 2 tablets by mouth at bedtime. 30 tablet 0  . spironolactone (ALDACTONE) 25 MG tablet TAKE 1/2 TABLET (12.5 MG TOTAL) BY MOUTH DAILY. 45 tablet 10  . traMADol (ULTRAM) 50 MG tablet Take 1 tablet by mouth daily as needed (pain).     . vitamin B-12 (CYANOCOBALAMIN) 1000 MCG tablet Take 1,000 mcg by mouth daily.    [name] warfarin (COUMADIN) 5 MG tablet Take 5mg  everyday except on Tuesday and Thursdays take 7.5mg  Please hold coumadin on 3/25, restart coumadin on 3/26, check INR on Monday 3/27. 40 tablet 3  . ZETIA 10 MG tablet TAKE 1 TABLET BY MOUTH EVERY DAY 90 tablet 1   No current facility-administered medications for this visit.    Allergies:   Review of  patient's allergies indicates no known allergies.    Social History:  The patient  reports that he quit smoking about 17 years ago. His smoking use included Cigarettes. He has a 195 pack-year smoking history. He has never used smokeless tobacco. He reports that he does not drink alcohol or use illicit drugs.   Family History:  The patient's family history includes Asthma in his father; Heart attack in  his brother; Heart disease in his brother; Hypertension in his brother, father, mother, and sister; Stroke in his father.    ROS:  General:no colds or fevers,  weight up but almost done with steroids.   Skin:no rashes or ulcers HEENT:no blurred vision, no congestion CV:see HPI PUL:see HPI GI:no diarrhea constipation or melena, no indigestion GU:no hematuria, no dysuria MS:no joint pain, no claudication Neuro:no syncope, no lightheadedness Endo:+ diabetes, + thyroid disease  Wt Readings from Last 3 Encounters:  01/29/16 266 lb 9.6 oz (120.929 kg)  01/23/16 264 lb 3.2 oz (119.84 kg)  01/16/16 261 lb 1.6 oz (118.434 kg)     PHYSICAL EXAM: VS:  BP 160/70 mmHg  Pulse 70  Ht 5\' 8"  (1.727 m)  Wt 266 lb 9.6 oz (120.929 kg)  BMI 40.55 kg/m2  SpO2 95% , BMI Body mass index is 40.55 kg/(m^2). General:Pleasant affect, NAD Skin:Warm and dry, brisk capillary refill HEENT:normocephalic, sclera clear, mucus membranes moist Neck:supple, no JVD, no bruits  Heart:S1S2 RRR without murmur, gallup, rub or click Lungs:clear without rales, rhonchi, or wheezes [id number], non tender, + BS, do not palpate liver spleen or masses Ext:+ 1 bil lower ext edema,  2+ radial pulses Neuro:alert and oriented X 3, MAE, follows commands, + facial symmetry    EKG:  EKG is  NOT ordered today.    Recent Labs: 01/10/2016: B Natriuretic Peptide 532.2* 01/11/2016: ALT 25 01/15/2016: TSH 1.160 01/16/2016: BUN 60*; Creatinine, Ser 1.85*; Hemoglobin 11.2*; Platelets 236; Potassium 5.1; Sodium 140    Lipid Panel      Component Value Date/Time   CHOL 137 11/12/2014 0921   CHOL  11/29/2008 0330    88        ATP III CLASSIFICATION:  <200     mg/dL   Desirable  200-239  mg/dL   Borderline High  >=240    mg/dL   High          TRIG 95 11/12/2014 0921   TRIG 52 11/29/2008 0330   HDL 50 11/12/2014 0921   HDL 34* 11/29/2008 0330   CHOLHDL 2.6 11/29/2008 0330   VLDL 10 11/29/2008 0330   LDLCALC 68 11/12/2014 0921   LDLCALC  11/29/2008 0330    44        Total Cholesterol/HDL:CHD Risk Coronary Heart Disease Risk Table                     Men   Women  1/2 Average Risk   3.4   3.3  Average Risk       5.0   4.4  2 X Average Risk   9.6   7.1  3 X Average Risk  23.4   11.0        Use the calculated Patient Ratio above and the CHD Risk Table to determine the patient's CHD Risk.        ATP III CLASSIFICATION (LDL):  <100     mg/dL   Optimal  100-129  mg/dL   Near or Above                    Optimal  130-159  mg/dL   Borderline  160-189  mg/dL   High  >190     mg/dL   Very High       Other studies Reviewed: Additional studies/ records that were reviewed today include:  Hospital notes. ECHO 01/11/16 Study Conclusions  - Left ventricle: The cavity size was normal. Wall  thickness was  increased in a pattern of mild LVH. Systolic function was  moderately reduced. The estimated ejection fraction was in the  range of 35% to 40%. Anterior and inferolateral hypokinesis  noted. Features are consistent with a pseudonormal left  ventricular filling pattern, with concomitant abnormal relaxation  and increased filling pressure (grade 2 diastolic dysfunction). - Aortic valve: There was no stenosis. - Mitral valve: There was no significant regurgitation. - Left atrium: The atrium was moderately to severely dilated. - Right ventricle: Poorly visualized. The cavity size was normal.  Pacer wire or catheter noted in right ventricle. Systolic  function was normal. - Pulmonary arteries: PA peak  pressure: 23 mm Hg (S). - Inferior vena cava: The vessel was normal in size. The  respirophasic diameter changes were in the normal range (>= 50%),  consistent with normal central venous pressure.  ASSESSMENT AND PLAN:  1.  Acute on chronic systolic HF improved.  Continue current lasix 40 BID- he was on this pre hospital.    2. Hypertension  BP elevated but no lasix yet today.  And he is stopping steroids in next day or so hopefully BP will come down and on visit last week normal BP.  3. Edema bil legs if wt goes up 3 lbs  In a day or 5 lbs in a week he will call for diuretic adjustment.   4. AKI    Followed by PCP now Cr improved if continues to be stable would resume ARB.  5. PAF maintaining SR on coumadin and INR stable per coumadin clinic.  On amiodarone.   6. Acute respiratory failure improved but with wheezes still on steroids.  Followed by Pulmonary.     7. Diabetes per PCP.  He will keep appt in May but if problems we will call to be seen.    Current medicines are reviewed with the patient today.  The patient Has no concerns regarding medicines.  The following changes have been made:  See above Labs/ tests ordered today include:see above  Disposition:   FU:  see above  Lennie Muckle, NP  01/29/2016 10:37 AM    [facility] [address], [place], [place], [place] Phone: [phone]; Fax: [phone]

## 2016-01-29 ENCOUNTER — Encounter: Payer: Self-pay | Admitting: Cardiology

## 2016-01-29 ENCOUNTER — Ambulatory Visit (INDEPENDENT_AMBULATORY_CARE_PROVIDER_SITE_OTHER): Payer: Medicare Other

## 2016-01-29 ENCOUNTER — Ambulatory Visit (INDEPENDENT_AMBULATORY_CARE_PROVIDER_SITE_OTHER): Payer: Medicare Other | Admitting: Cardiology

## 2016-01-29 VITALS — BP 160/70 | HR 70 | Ht 68.0 in | Wt 266.6 lb

## 2016-01-29 DIAGNOSIS — Z7901 Long term (current) use of anticoagulants: Secondary | ICD-10-CM | POA: Diagnosis not present

## 2016-01-29 DIAGNOSIS — I480 Paroxysmal atrial fibrillation: Secondary | ICD-10-CM | POA: Diagnosis not present

## 2016-01-29 DIAGNOSIS — I10 Essential (primary) hypertension: Secondary | ICD-10-CM

## 2016-01-29 DIAGNOSIS — I5023 Acute on chronic systolic (congestive) heart failure: Secondary | ICD-10-CM

## 2016-01-29 DIAGNOSIS — I420 Dilated cardiomyopathy: Secondary | ICD-10-CM

## 2016-01-29 DIAGNOSIS — Z95810 Presence of automatic (implantable) cardiac defibrillator: Secondary | ICD-10-CM

## 2016-01-29 DIAGNOSIS — I2510 Atherosclerotic heart disease of native coronary artery without angina pectoris: Secondary | ICD-10-CM

## 2016-01-29 DIAGNOSIS — I5022 Chronic systolic (congestive) heart failure: Secondary | ICD-10-CM

## 2016-01-29 DIAGNOSIS — I4891 Unspecified atrial fibrillation: Secondary | ICD-10-CM | POA: Diagnosis not present

## 2016-01-29 DIAGNOSIS — I255 Ischemic cardiomyopathy: Secondary | ICD-10-CM

## 2016-01-29 LAB — POCT INR: INR: 3.9

## 2016-01-29 NOTE — Patient Instructions (Addendum)
Medication Instructions:  None  Labwork: None  Testing/Procedures: None  Follow-Up: As scheduled in May.  Any Other Special Instructions Will Be Listed Below (If Applicable).  Weigh daily Call [phone] if weight climbs more than 3 pounds in a day or 5 pounds in a week. No salt to very little salt in your diet.  No more than 2000 mg in a day. Call if increased shortness of breath or increased swelling.  If you need a refill on your cardiac medications before your next appointment, please call your pharmacy.

## 2016-01-30 ENCOUNTER — Encounter: Payer: Self-pay | Admitting: Cardiology

## 2016-02-03 DIAGNOSIS — Z794 Long term (current) use of insulin: Secondary | ICD-10-CM | POA: Diagnosis not present

## 2016-02-03 DIAGNOSIS — Z1389 Encounter for screening for other disorder: Secondary | ICD-10-CM | POA: Diagnosis not present

## 2016-02-03 DIAGNOSIS — J449 Chronic obstructive pulmonary disease, unspecified: Secondary | ICD-10-CM | POA: Diagnosis not present

## 2016-02-03 DIAGNOSIS — E1122 Type 2 diabetes mellitus with diabetic chronic kidney disease: Secondary | ICD-10-CM | POA: Diagnosis not present

## 2016-02-03 DIAGNOSIS — I5022 Chronic systolic (congestive) heart failure: Secondary | ICD-10-CM | POA: Diagnosis not present

## 2016-02-05 ENCOUNTER — Ambulatory Visit (INDEPENDENT_AMBULATORY_CARE_PROVIDER_SITE_OTHER): Payer: Medicare Other | Admitting: Acute Care

## 2016-02-05 ENCOUNTER — Encounter: Payer: Self-pay | Admitting: Acute Care

## 2016-02-05 ENCOUNTER — Ambulatory Visit (INDEPENDENT_AMBULATORY_CARE_PROVIDER_SITE_OTHER): Payer: Medicare Other

## 2016-02-05 ENCOUNTER — Other Ambulatory Visit: Payer: Self-pay | Admitting: Cardiology

## 2016-02-05 VITALS — BP 142/74 | HR 85 | Ht 68.0 in | Wt 268.0 lb

## 2016-02-05 DIAGNOSIS — L602 Onychogryphosis: Secondary | ICD-10-CM | POA: Diagnosis not present

## 2016-02-05 DIAGNOSIS — J302 Other seasonal allergic rhinitis: Secondary | ICD-10-CM

## 2016-02-05 DIAGNOSIS — I255 Ischemic cardiomyopathy: Secondary | ICD-10-CM | POA: Diagnosis not present

## 2016-02-05 DIAGNOSIS — I70293 Other atherosclerosis of native arteries of extremities, bilateral legs: Secondary | ICD-10-CM | POA: Diagnosis not present

## 2016-02-05 DIAGNOSIS — G4733 Obstructive sleep apnea (adult) (pediatric): Secondary | ICD-10-CM | POA: Diagnosis not present

## 2016-02-05 DIAGNOSIS — J9601 Acute respiratory failure with hypoxia: Secondary | ICD-10-CM

## 2016-02-05 DIAGNOSIS — E1351 Other specified diabetes mellitus with diabetic peripheral angiopathy without gangrene: Secondary | ICD-10-CM | POA: Diagnosis not present

## 2016-02-05 DIAGNOSIS — M19071 Primary osteoarthritis, right ankle and foot: Secondary | ICD-10-CM | POA: Diagnosis not present

## 2016-02-05 DIAGNOSIS — J441 Chronic obstructive pulmonary disease with (acute) exacerbation: Secondary | ICD-10-CM

## 2016-02-05 DIAGNOSIS — L84 Corns and callosities: Secondary | ICD-10-CM | POA: Diagnosis not present

## 2016-02-05 DIAGNOSIS — J309 Allergic rhinitis, unspecified: Secondary | ICD-10-CM | POA: Insufficient documentation

## 2016-02-05 DIAGNOSIS — Z95810 Presence of automatic (implantable) cardiac defibrillator: Secondary | ICD-10-CM

## 2016-02-05 DIAGNOSIS — I5023 Acute on chronic systolic (congestive) heart failure: Secondary | ICD-10-CM

## 2016-02-05 MED ORDER — PREDNISONE 10 MG PO TABS: ORAL_TABLET | ORAL | Status: DC

## 2016-02-05 NOTE — Patient Instructions (Addendum)
It is good to see you again today. Please add Claritin or the generic once daily for seasonal allergies. We will repeat the prednisone taper Prednisone taper; 10 mg tablets: 4 tabs x 2 days, 3 tabs x 2 days, 2 tabs x 2 days 1 tab x 2 days then stop. We will walk you in the office today to see if your oxygen levels drop. Follow up in 3 weeks if you are not better Follow up with Dr. Halford Chessman in June as is already scheduled. Continue wearing CPAP every night for at least 4-6 hours. Please contact office for sooner follow up if symptoms do not improve or worsen or seek emergency care

## 2016-02-05 NOTE — Progress Notes (Addendum)
Subjective:    Patient ID: [name], male    DOB: February 15, 1934, 82 y.o.   MRN: [id number]  HPI  Patient is a 82 yo male with history of COPD on CPAP, left subclavian stenosis, CHF ( systolic, EF [id number] in [id number]), Afib s/p biventricular defibrillator, CAD s/p CABG, hypothyroidism, HTN, CVA , warfarin patient.  Recent hospitalization for multi-factorial acute respiratory failure with Hypoxia/ Acute COPD Exacerbation/ right lower lobe pneumonia. Treated with azithromycin and Augmentin. Significant events/procedures:  Recent hospitalization for multifactorial acute respiratory failure with hypoxia./ COPD exacerbation/ pneumonia/ CHF  Admit date: 01/10/2016 Discharge date: 01/16/2016  Treated with Zithromax,Rocephin, discharged with Augmentin to complete treatment. IV steroids, sent home with prednisone taper. Scheduled BD's  Recommendations for Outpatient Follow-up:  1. Please check INR on 3/27, further coumadin adjustment per coumadin clinic 2. F/u with PMD within a week for hospital discharge follow up, repeat cbc/bmp at follow up, monitor cr level and blood glucose control 3. F/u with cardiology for chf 4. F/u with pulmonology for copd   01/11/2016 2-D echocardiogram EF: 35-40%: Pulmonary arteries: PA peak pressure: 23 mm Hg (S).  01/15/2016 CT chest without contrast:  CT there is emphysematous changes of the lungs. There is a patchy area of interstitial prominence in the right upper lobe similar to prior study most compatible with scarring. There is no focal consolidation, pleural effusion, or pneumothorax. The central airways are patent.   IMPRESSION: No acute intrathoracic pathology.  Emphysema. 02/05/2016: 2 Week follow up  Office Visit: Pt. Returns to the office for follow up. He states he continues to have wheezing and dyspnea  on exertion. He states he continues to have a non-productive cough, but his wife states that this is much better since the prednisone.  States when he does cough anything up it is white. He denies fever,chest pain, orthopnea,hemoptysis or recent auto or airline travel.His PCP increased his Lasix from 80 mg daily to 120 mg daily yesterday to relieve some lower extremity edema. He is compliant with his albuterol neb treatments/ his proventil inhaler/ Dulera inhaler/He states he is compliant with his CPAP nightly.   Current outpatient prescriptions:  .  albuterol (PROVENTIL HFA;VENTOLIN HFA) 108 (90 BASE) MCG/ACT inhaler, Inhale 2 puffs into the lungs every 6 (six) hours as needed for wheezing or shortness of breath. Must keep June 2016 appt., Disp: 1 Inhaler, Rfl: 0 .  albuterol (PROVENTIL) (2.5 MG/3ML) 0.083% nebulizer solution, USE 1 VIAL VIA NEBULIZER 4 TIMES A DAY AS DIRECTED, Disp: 300 mL, Rfl: 2 .  amiodarone (PACERONE) 200 MG tablet, TAKE 1 TABLET BY MOUTH EVERY DAY, Disp: 30 tablet, Rfl: 9 .  aspirin 81 MG tablet, Take 81 mg by mouth daily.  , Disp: , Rfl:  .  atorvastatin (LIPITOR) 80 MG tablet, TAKE 1 TABLET (80 MG TOTAL) BY MOUTH DAILY., Disp: 90 tablet, Rfl: 3 .  carvedilol (COREG) 6.25 MG tablet, TAKE 1 TABLET BY MOUTH TWICE A DAY, Disp: 180 tablet, Rfl: 3 .  DULERA 100-5 MCG/ACT AERO, TAKE 2 PUFFS BY MOUTH TWICE A DAY, Disp: 1 Inhaler, Rfl: 6 .  furosemide (LASIX) 40 MG tablet, Take 1 tablet (40 mg total) by mouth 2 (two) times daily. Please hold lasix on 3/25, restart on 3/26, (Patient taking differently: Take 40 mg by mouth 2 (two) times daily. Pt currently taking 2 tab qam X1 week.), Disp: 30 tablet, Rfl: 0 .  guaiFENesin (MUCINEX) 600 MG 12 hr tablet, Take 1 tablet (600 mg total) by mouth  2 (two) times daily., Disp: 30 tablet, Rfl: 0 .  HYDROcodone-acetaminophen (NORCO/VICODIN) 5-325 MG per tablet, Take 1 tablet by mouth daily as needed for moderate pain. , Disp: , Rfl:  .  insulin aspart (NOVOLOG) 100 UNIT/ML injection, Inject 23 units into the skin before breakfast and lunch, inject 27units into the skin before  supper, Disp: , Rfl:  .  insulin glargine (LANTUS SOLOSTAR) 100 UNIT/ML injection, Inject 42 Units into the skin 2 (two) times daily. , Disp: , Rfl:  .  levothyroxine (SYNTHROID, LEVOTHROID) 100 MCG tablet, Take 100 mcg by mouth daily before breakfast. , Disp: , Rfl:  .  Multiple Vitamin (MULTIVITAMIN) tablet, Take 1 tablet by mouth daily.  , Disp: , Rfl:  .  omeprazole (PRILOSEC) 20 MG capsule, Take 20 mg by mouth daily.  , Disp: , Rfl:  .  senna-docusate (SENOKOT-S) 8.6-50 MG tablet, Take 2 tablets by mouth at bedtime., Disp: 30 tablet, Rfl: 0 .  spironolactone (ALDACTONE) 25 MG tablet, TAKE 1/2 TABLET (12.5 MG TOTAL) BY MOUTH DAILY., Disp: 45 tablet, Rfl: 10 .  traMADol (ULTRAM) 50 MG tablet, Take 1 tablet by mouth daily as needed (pain). , Disp: , Rfl:  .  vitamin B-12 (CYANOCOBALAMIN) 1000 MCG tablet, Take 1,000 mcg by mouth daily., Disp: , Rfl:  .  warfarin (COUMADIN) 5 MG tablet, Take 5mg  everyday except on Tuesday and Thursdays take 7.5mg  Please hold coumadin on 3/25, restart coumadin on 3/26, check INR on Monday 3/27., Disp: 40 tablet, Rfl: 3 .  ZETIA 10 MG tablet, TAKE 1 TABLET BY MOUTH EVERY DAY, Disp: 90 tablet, Rfl: 1 .  predniSONE (DELTASONE) 10 MG tablet, 40mg X2 days, 30mg  X2 days, 20mg  X2 days, 10mg X2 days, then stop., Disp: 20 tablet, Rfl: 0   Past Medical History  Diagnosis Date  . Dyspnea   . OSA (obstructive sleep apnea)   . HTN (hypertension)   . DM (diabetes mellitus), type 2 ([place])   . Hyperlipidemia   . Systolic congestive heart failure ([place]) 2009    s/p BiV ICD implantation by Dr Leonia Reeves ([facility])  . Atrial fibrillation ([place])     persistent, previously seen at [facility] and placed on amiodarone  . Hypothyroidism   . PNA (pneumonia)   . Cleft palate   . Nasal septal deviation   . Iron deficiency anemia   . GERD (gastroesophageal reflux disease)   . Benign prostatic hypertrophy   . Nephrolithiasis   . Psoriasis   . Seborrheic keratosis   . COPD with emphysema ([place])  04/01/2010  . CAD (coronary artery disease)     multivessel s/p inferolateral wall MI with subsequent CABG 11/1998.  Cath 2009 with Patent grafts  . Ischemic dilated cardiomyopathy     EF 35-40% by MUGA 6/11  . PAF (paroxysmal atrial fibrillation) ([place])   . Peripheral arterial disease ([facility])     left subclavian artery stenosis  . Stroke ([place])   . Myocardial infarction ([place])     No Known Allergies  Review of Systems    Constitutional:   No  weight loss, night sweats,  Fevers, chills, fatigue, or  lassitude.  HEENT:   No headaches,  Difficulty swallowing,  Tooth/dental problems, or  Sore throat,                No sneezing, itching, ear ache, +nasal congestion, +post nasal drip,   CV:  No chest pain,  Orthopnea, PND, +swelling in lower extremities, no anasarca, dizziness, palpitations, syncope.   GI  No heartburn, indigestion, abdominal pain, nausea, vomiting, diarrhea, change in bowel habits, loss of appetite, bloody stools.   Resp: + shortness of breath with exertion not at rest.  No excess mucus, no productive cough,  + non-productive cough,  No coughing up of blood.  No change in color of mucus.  + wheezing.  No chest wall deformity  Skin: no rash or lesions.  GU: no dysuria, change in color of urine, no urgency or frequency.  No flank pain, no hematuria   MS:  No joint pain or swelling.  No decreased range of motion.  No back pain.  Psych:  No change in mood or affect. No depression or anxiety.  No memory loss.     Objective:   Physical Exam BP 142/74 mmHg  Pulse 85  Ht 5\' 8"  (1.727 m)  Wt 268 lb (121.564 kg)  BMI 40.76 kg/m2  SpO2 97%  Physical Exam:  General- No distress,  A&Ox3, obese ENT: No sinus tenderness, TM clear, pale nasal mucosa, no oral exudate,+ post nasal drip, no LAN Cardiac: S1, S2, regular rate and rhythm, no murmur Chest: Exp.  wheeze/ no rales/ dullness; no accessory muscle use, no nasal flaring, no sternal retractions Abd.: Soft Non-tender Ext:  No clubbing cyanosis, edema Neuro:  normal strength Skin: No rashes, warm and dry Psych: normal mood and behavior  Magdalen Spatz, AGACNP-BC [facility] February 05, 2016      Assessment & Plan:

## 2016-02-05 NOTE — Assessment & Plan Note (Signed)
R

## 2016-02-05 NOTE — Assessment & Plan Note (Signed)
Pt. Remains compliant with his CPAP every night. Is benefiting from treatment Plan:   Continue on CPAP at bedtime. You appear to be benefiting from the treatment Goal is to wear for at least 4-6 hours each night for maximal clinical benefit. Continue to work on weight loss, as the link between excess weight  and sleep apnea is well established.  Do not drive if sleepy. Follow up with Dr. Halford Chessman  June 5th as scheduled  or before as needed.

## 2016-02-05 NOTE — Assessment & Plan Note (Signed)
Slow to resolve COPD Exacerbation No fever No purulent secretions Plan: Please add Claritin or the generic once daily for seasonal allergies. We will repeat the prednisone taper Prednisone taper; 10 mg tablets: 4 tabs x 2 days, 3 tabs x 2 days, 2 tabs x 2 days 1 tab x 2 days then stop. We will walk you in the office today to see if your oxygen levels drop.( Low was 95%) Call if you develop fever or change in sputum. Follow up in 3 weeks if you are not better Follow up with Dr. Halford Chessman in June as is already scheduled.

## 2016-02-05 NOTE — Progress Notes (Signed)
Reviewed.  He was in hospital in March 2017 for PNA and CHF exacerbation.  Also COPD exacerbation.  Slow to resolve.  Has been tx with Abx.  Agree with plan for repeat steroid taper.  Chesley Mires, MD [facility] 02/05/2016, 10:11 AM Pager:  [phone]

## 2016-02-06 NOTE — Progress Notes (Signed)
EPIC Encounter for ICM Monitoring  Patient Name: [name] is a 82 y.o. male Date: 02/06/2016 Primary Care Physican: Irven Shelling, MD Primary Cardiologist: Irish Lack Electrophysiologist: Allred Dry Weight: 262 lbs   Bi-V Pacing 97.6%      In the past month, have you:  1. Gained more than 2 pounds in a day or more than 5 pounds in a week? Yes, 5 lbs in last 2 weeks  2. Had changes in your medications (with verification of current medications)? Temporary increase of Furosemide x 5 days  3. Had more shortness of breath than is usual for you? Always short of breath  4. Limited your activity because of shortness of breath? no  5. Not been able to sleep because of shortness of breath? no  6. Had increased swelling in your feet or ankles? Yes  7. Had symptoms of dehydration (dizziness, dry mouth, increased thirst, decreased urine output) no  8. Had changes in sodium restriction? no  9. Been compliant with medication? Yes   ICM trend: 3 month view for 02/05/2016   ICM trend: 1 year view for 02/05/2016   Follow-up plan: ICM clinic phone appointment on 02/11/2016 and office visit with Dr Irish Lack on 03/12/2016.   Spoke with wife, [name] ([place]).  Thoracic impedance below reference line from 01/24/2015 to 02/05/2016 suggesting fluid accumulation.  Patient has symptoms of weight gain and lower extremity swelling. PCP office was contacted yesterday for symptoms and order to increase Furosemide to 80 mg am and 40 mg pm x 5 days and then resume regular prescribed dosage.  Wife stated his leg edema has improved since yesterday and weight is down by 1 lb.  Patient still has SOB which is also related to COPD.  He had recent visit to pulmonologist.    Wife requested to send an update to Dr Laurann Montana regarding improvement of symptoms.    Advised would send to Dr Irish Lack and Dr Rayann Heman to provide update on symptoms and temporary increase in Furosemide.   Copy of note sent to patient's primary  care physician, primary cardiologist, and device following physician.  Rosalene Billings, RN, CCM 02/06/2016 9:43 AM

## 2016-02-11 ENCOUNTER — Ambulatory Visit (INDEPENDENT_AMBULATORY_CARE_PROVIDER_SITE_OTHER): Payer: Medicare Other

## 2016-02-11 DIAGNOSIS — I5023 Acute on chronic systolic (congestive) heart failure: Secondary | ICD-10-CM | POA: Diagnosis not present

## 2016-02-11 DIAGNOSIS — Z95810 Presence of automatic (implantable) cardiac defibrillator: Secondary | ICD-10-CM | POA: Diagnosis not present

## 2016-02-11 NOTE — Progress Notes (Signed)
EPIC Encounter for ICM Monitoring  Patient Name: [name] is a 82 y.o. male Date: 02/11/2016 Primary Care Physican: Irven Shelling, MD Primary Cardiologist: Irish Lack Electrophysiologist: Allred Dry Weight: unknown   Bi-V Pacing 75%       In the past month, have you:  1. Gained more than 2 pounds in a day or more than 5 pounds in a week? no  2. Had changes in your medications (with verification of current medications)? no  3. Had more shortness of breath than is usual for you? no  4. Limited your activity because of shortness of breath? no  5. Not been able to sleep because of shortness of breath? no  6. Had increased swelling in your feet or ankles? no  7. Had symptoms of dehydration (dizziness, dry mouth, increased thirst, decreased urine output) no  8. Had changes in sodium restriction? no  9. Been compliant with medication? Yes   ICM trend: 3 month view for 02/11/2016   ICM trend: 1 year view for 02/11/2016   Follow-up plan: ICM clinic phone appointment on 02/20/2016.    Call to wife, [name] [facility]).  Since last ICM transmission on 02/05/2016, thoracic impedance remained below above reference line suggesting fluid accumulation and starting to trend back toward reference line today after increase in Furosemide x 5 days (through 02/10/2016).   Wife reported improvement of leg edema and breathing remains unchanged.  He will be taking the last dose of Prednisone tomorrow for his COPD.       V Pacing has dropped from 97.6% and atrial pacing 98.5% on 02/05/2016 to V Pacing 75% and atrial pacing 8.0% on 02/11/2016.   Time in AT/AF is 20.9 hr/day (87.2%) since 02/05/2016.  Will consult with Dr Rayann Heman regarding changes in BiV Pacing.          Reviewed transmission with Dr Rayann Heman.  He recommended a repeat transmission on 02/20/2016 since patient has been in Afib in the last week.  Will call patient/wife to determine if having any Afib symptoms.    Attempted call to wife, [name]  and requested return call.  Call to patient/wife.  Explained Dr Rayann Heman reviewed transmission and explained he has had afib for past week and if he has any symptoms. He denied any symptoms and reported he did break a rib from a fall last week so it difficult to breathe at times.     Copy of note sent to patient's primary care physician, primary cardiologist, and device following physician.  Rosalene Billings, RN, CCM 02/11/2016 3:32 PM

## 2016-02-12 ENCOUNTER — Ambulatory Visit (INDEPENDENT_AMBULATORY_CARE_PROVIDER_SITE_OTHER): Payer: Medicare Other | Admitting: *Deleted

## 2016-02-12 DIAGNOSIS — Z7901 Long term (current) use of anticoagulants: Secondary | ICD-10-CM | POA: Diagnosis not present

## 2016-02-12 DIAGNOSIS — I480 Paroxysmal atrial fibrillation: Secondary | ICD-10-CM

## 2016-02-12 DIAGNOSIS — I4891 Unspecified atrial fibrillation: Secondary | ICD-10-CM | POA: Diagnosis not present

## 2016-02-12 LAB — POCT INR: INR: 3.9

## 2016-02-17 DIAGNOSIS — Z7901 Long term (current) use of anticoagulants: Secondary | ICD-10-CM | POA: Diagnosis not present

## 2016-02-17 DIAGNOSIS — Z5181 Encounter for therapeutic drug level monitoring: Secondary | ICD-10-CM | POA: Diagnosis not present

## 2016-02-20 ENCOUNTER — Ambulatory Visit (INDEPENDENT_AMBULATORY_CARE_PROVIDER_SITE_OTHER): Payer: Medicare Other

## 2016-02-20 DIAGNOSIS — I5022 Chronic systolic (congestive) heart failure: Secondary | ICD-10-CM

## 2016-02-20 DIAGNOSIS — Z95810 Presence of automatic (implantable) cardiac defibrillator: Secondary | ICD-10-CM

## 2016-02-20 NOTE — Progress Notes (Signed)
EPIC Encounter for ICM Monitoring  Patient Name: [name] is a 82 y.o. male Date: 02/20/2016 Primary Care Physican: Irven Shelling, MD Primary Cardiologist: Irish Lack Electrophysiologist: Allred Dry Weight: 254 lbs   Bi-V Pacing 76.6% (was 97.9% on 01/22/2016)     In the past month, have you:  1. Gained more than 2 pounds in a day or more than 5 pounds in a week? no  2. Had changes in your medications (with verification of current medications)? Yes, Dr Laurann Montana is keep Furosemide to 80 mg am and 40 pm at this time.    3. Had more shortness of breath than is usual for you? no  4. Limited your activity because of shortness of breath? no  5. Not been able to sleep because of shortness of breath? no  6. Had increased swelling in your feet or ankles? no  7. Had symptoms of dehydration (dizziness, dry mouth, increased thirst, decreased urine output) no  8. Had changes in sodium restriction? no  9. Been compliant with medication? Yes  ICM trend: 3 month view for 02/20/2016  ICM trend: 1 year view for 02/20/2016   Follow-up plan: ICM clinic phone appointment 03/11/2016 and appointment with Dr Irish Lack 03/12/2016.   Spoke with wife ([facility]).  FLUID LEVELS: Since last ICM transmission 02/11/2016, Optivol thoracic impedance returned to reference line 02/14/2016.   SYMPTOMS:   Symptoms of weight gain, lower leg edema has resolved since Dr Laurann Montana has changed Furosemide 40 mg two tablets in am and one table . Encouraged to call for any fluid symptoms.   She stated Dr Laurann Montana took labs on 02/16/2016 and waiting for results.   EDUCATION: Limit sodium intake to < 2000 mg and fluid intake to 64 oz daily.   No changes today.    Reviewed transmission with Dr Rayann Heman regarding decreased Bi V Pacing of 89% and increased AT/AF and patient denied any symptoms at this time.  Recommendation to continue to monitor for changes.   Advised will send to [name] for review and Dr Jackalyn Lombard  recommendations.    Copy of note sent to PCP.  Rosalene Billings, RN, CCM 02/20/2016 12:30 PM

## 2016-02-23 DIAGNOSIS — D225 Melanocytic nevi of trunk: Secondary | ICD-10-CM | POA: Diagnosis not present

## 2016-02-23 DIAGNOSIS — L821 Other seborrheic keratosis: Secondary | ICD-10-CM | POA: Diagnosis not present

## 2016-02-23 DIAGNOSIS — L820 Inflamed seborrheic keratosis: Secondary | ICD-10-CM | POA: Diagnosis not present

## 2016-02-26 ENCOUNTER — Ambulatory Visit (INDEPENDENT_AMBULATORY_CARE_PROVIDER_SITE_OTHER): Payer: Medicare Other | Admitting: *Deleted

## 2016-02-26 DIAGNOSIS — I4891 Unspecified atrial fibrillation: Secondary | ICD-10-CM | POA: Diagnosis not present

## 2016-02-26 DIAGNOSIS — Z7901 Long term (current) use of anticoagulants: Secondary | ICD-10-CM | POA: Diagnosis not present

## 2016-02-26 DIAGNOSIS — I480 Paroxysmal atrial fibrillation: Secondary | ICD-10-CM

## 2016-02-26 LAB — POCT INR
INR: 3.8
INR: 3.9

## 2016-03-11 ENCOUNTER — Ambulatory Visit (INDEPENDENT_AMBULATORY_CARE_PROVIDER_SITE_OTHER): Payer: Medicare Other

## 2016-03-11 DIAGNOSIS — I5022 Chronic systolic (congestive) heart failure: Secondary | ICD-10-CM

## 2016-03-11 DIAGNOSIS — Z95810 Presence of automatic (implantable) cardiac defibrillator: Secondary | ICD-10-CM | POA: Diagnosis not present

## 2016-03-12 ENCOUNTER — Ambulatory Visit (INDEPENDENT_AMBULATORY_CARE_PROVIDER_SITE_OTHER): Payer: Medicare Other | Admitting: *Deleted

## 2016-03-12 ENCOUNTER — Encounter: Payer: Self-pay | Admitting: Interventional Cardiology

## 2016-03-12 ENCOUNTER — Ambulatory Visit (INDEPENDENT_AMBULATORY_CARE_PROVIDER_SITE_OTHER): Payer: Medicare Other | Admitting: Interventional Cardiology

## 2016-03-12 VITALS — BP 128/70 | HR 88 | Ht 68.0 in | Wt 257.0 lb

## 2016-03-12 DIAGNOSIS — R0602 Shortness of breath: Secondary | ICD-10-CM

## 2016-03-12 DIAGNOSIS — Z7901 Long term (current) use of anticoagulants: Secondary | ICD-10-CM

## 2016-03-12 DIAGNOSIS — I480 Paroxysmal atrial fibrillation: Secondary | ICD-10-CM

## 2016-03-12 DIAGNOSIS — I5022 Chronic systolic (congestive) heart failure: Secondary | ICD-10-CM

## 2016-03-12 DIAGNOSIS — I10 Essential (primary) hypertension: Secondary | ICD-10-CM | POA: Diagnosis not present

## 2016-03-12 DIAGNOSIS — I255 Ischemic cardiomyopathy: Secondary | ICD-10-CM

## 2016-03-12 DIAGNOSIS — I420 Dilated cardiomyopathy: Secondary | ICD-10-CM

## 2016-03-12 LAB — BASIC METABOLIC PANEL
BUN: 27 mg/dL — ABNORMAL HIGH (ref 7–25)
CO2: 29 mmol/L (ref 20–31)
Calcium: 9.2 mg/dL (ref 8.6–10.3)
Chloride: 98 mmol/L (ref 98–110)
Creat: 1.54 mg/dL — ABNORMAL HIGH (ref 0.70–1.11)
Glucose, Bld: 114 mg/dL — ABNORMAL HIGH (ref 65–99)
Potassium: 4.1 mmol/L (ref 3.5–5.3)
Sodium: 139 mmol/L (ref 135–146)

## 2016-03-12 LAB — POCT INR: INR: 3

## 2016-03-12 LAB — BRAIN NATRIURETIC PEPTIDE: Brain Natriuretic Peptide: 262.8 pg/mL — ABNORMAL HIGH (ref <100)

## 2016-03-12 NOTE — Progress Notes (Signed)
Patient ID: [name], male   DOB: 12-09-1933, 82 y.o.   MRN: [id number]     Cardiology Office Note   Date:  03/12/2016   ID:  [name], DOB 1933/12/01, MRN [id number]  PCP:  Irven Shelling, MD    No chief complaint on file. f/u [facility]   Wt Readings from Last 3 Encounters:  03/12/16 257 lb (116.574 kg)  02/05/16 268 lb (121.564 kg)  01/29/16 266 lb 9.6 oz (120.929 kg)       History of Present Illness: [name] is a 82 y.o. male  Who has a history of COPD on CPAP, left subclavian stenosis, CHF ( systolic, EF [id number] in [id number]), Afib s/p biventricular defibrillator, CAD s/p CABG, hypothyroidism, HTN, CVA.  Admitted 01/16/16 with acute resp. Failure with hypoxia acute COPD exacerbation RLL PNA, and acute systolic HF.Chronic systolic CHF: EF 123456 -Weight stable/decreasing  Paroxsymal Afib At discharge in 3/17: - Currently in AV paced rhythm on amiodarone. - CHADSVASC of 8, A/C with warfarin, theraputic AoCKD3- improving - ARB held,  -Resumed home oral lasix 40mg  BID  Wheezing increased since steroids stopped.  He has some DOE. He is taking his Lasix and has noted that his weight is decreasing.     Past Medical History  Diagnosis Date  . Dyspnea   . OSA (obstructive sleep apnea)   . HTN (hypertension)   . DM (diabetes mellitus), type 2 ([place])   . Hyperlipidemia   . Systolic congestive heart failure ([place]) 2009    s/p BiV ICD implantation by Dr Leonia Reeves (MDT)  . Atrial fibrillation ([place])     persistent, previously seen at [facility] and placed on amiodarone  . Hypothyroidism   . PNA (pneumonia)   . Cleft palate   . Nasal septal deviation   . Iron deficiency anemia   . GERD (gastroesophageal reflux disease)   . Benign prostatic hypertrophy   . Nephrolithiasis   . Psoriasis   . Seborrheic keratosis   . COPD with emphysema ([place]) 04/01/2010  . CAD (coronary artery disease)     multivessel s/p inferolateral wall MI with subsequent CABG 11/1998.  Cath  2009 with Patent grafts  . Ischemic dilated cardiomyopathy     EF 35-40% by MUGA 6/11  . PAF (paroxysmal atrial fibrillation) ([place])   . Peripheral arterial disease ([facility])     left subclavian artery stenosis  . Stroke ([place])   . Myocardial infarction [facility])     Past Surgical History  Procedure Laterality Date  . Carpal tunnel release    . Left cleft palate and left cleft lip repair    . Cataract extraction    . C-spine surgery    . Bi-ventricular implantable cardioverter defibrillator  (crt-d)  10-08-08; 11-06-2013    Dr Leonia Reeves (MDT) implant for primary prevention; gen change to MDT VivaXT CRTD by Dr Rayann Heman  . Coronary artery bypass graft      LIMA to LAD, SVG to OM, SVG to diagonal  . Biv icd genertaor change out N/A 11/06/2013    Procedure: BIV ICD [place];  Surgeon: Coralyn Mark, MD;  Location: [facility] CATH LAB;  Service: Cardiovascular;  Laterality: N/A;     Current Outpatient Prescriptions  Medication Sig Dispense Refill  . albuterol (PROVENTIL HFA;VENTOLIN HFA) 108 (90 BASE) MCG/ACT inhaler Inhale 2 puffs into the lungs every 6 (six) hours as needed for wheezing or shortness of breath. Must keep June 2016 appt. 1 Inhaler 0  . albuterol (PROVENTIL) (  2.5 MG/3ML) 0.083% nebulizer solution USE 1 VIAL VIA NEBULIZER 4 TIMES A DAY AS DIRECTED 300 mL 2  . amiodarone (PACERONE) 200 MG tablet TAKE 1 TABLET BY MOUTH EVERY DAY 30 tablet 9  . aspirin 81 MG tablet Take 81 mg by mouth daily.      [name] atorvastatin (LIPITOR) 80 MG tablet TAKE 1 TABLET (80 MG TOTAL) BY MOUTH DAILY. 90 tablet 2  . carvedilol (COREG) 6.25 MG tablet TAKE 1 TABLET BY MOUTH TWICE A DAY 180 tablet 2  . DULERA 100-5 MCG/ACT AERO TAKE 2 PUFFS BY MOUTH TWICE A DAY 1 Inhaler 6  . furosemide (LASIX) 40 MG tablet Take 40 mg by mouth 2 (two) times daily.    [name] HYDROcodone-acetaminophen (NORCO/VICODIN) 5-325 MG per tablet Take 1 tablet by mouth daily as needed for moderate pain.     [name] insulin aspart (NOVOLOG) 100 UNIT/ML  injection Inject 23 units into the skin before breakfast and lunch, inject 27units into the skin before supper    . levothyroxine (SYNTHROID, LEVOTHROID) 100 MCG tablet Take 100 mcg by mouth daily before breakfast.     . Multiple Vitamin (MULTIVITAMIN) tablet Take 1 tablet by mouth daily.      [name] omeprazole (PRILOSEC) 20 MG capsule Take 20 mg by mouth daily.      [name] senna-docusate (SENOKOT-S) 8.6-50 MG tablet Take 2 tablets by mouth at bedtime. 30 tablet 0  . spironolactone (ALDACTONE) 25 MG tablet TAKE 1/2 TABLET (12.5 MG TOTAL) BY MOUTH DAILY. 45 tablet 10  . traMADol (ULTRAM) 50 MG tablet Take 1 tablet by mouth daily as needed (pain).     . vitamin B-12 (CYANOCOBALAMIN) 1000 MCG tablet Take 1,000 mcg by mouth daily.    [name] warfarin (COUMADIN) 5 MG tablet Take 5mg  everyday except on Tuesday and Thursdays take 7.5mg  Please hold coumadin on 3/25, restart coumadin on 3/26, check INR on Monday 3/27. 40 tablet 3  . ZETIA 10 MG tablet TAKE 1 TABLET BY MOUTH EVERY DAY 90 tablet 1  . TRESIBA FLEXTOUCH 100 UNIT/ML SOPN Inject 70 Units into the skin daily. MORNINGS  3   No current facility-administered medications for this visit.    Allergies:   Review of patient's allergies indicates no known allergies.    Social History:  The patient  reports that he quit smoking about 17 years ago. His smoking use included Cigarettes. He has a 195 pack-year smoking history. He has never used smokeless tobacco. He reports that he does not drink alcohol or use illicit drugs.   Family History:  The patient's family history includes Asthma in his father; Heart attack in his brother; Heart disease in his brother; Hypertension in his brother, father, mother, and sister; Stroke in his father.    ROS:  Please see the history of present illness.   Otherwise, review of systems are positive for wheezing.   All other systems are reviewed and negative.    PHYSICAL EXAM: VS:  BP 128/70 mmHg  Pulse 88  Ht 5\' 8"  (1.727 m)  Wt  257 lb (116.574 kg)  BMI 39.09 kg/m2 , BMI Body mass index is 39.09 kg/(m^2). GEN: Well nourished, well developed, in no acute distress HEENT: normal Neck: no JVD, carotid bruits, or masses Cardiac: RRR; no murmurs, rubs, or gallops, bilateral leg edema  Respiratory:  wheezing bilaterally, normal work of breathing GI: soft, nontender, nondistended, + BS MS: no deformity or atrophy Skin: warm and dry, no rash Neuro:  Strength and sensation are  intact Psych: euthymic mood, full affect     Recent Labs: 01/10/2016: B Natriuretic Peptide 532.2* 01/11/2016: ALT 25 01/15/2016: TSH 1.160 01/16/2016: BUN 60*; Creatinine, Ser 1.85*; Hemoglobin 11.2*; Platelets 236; Potassium 5.1; Sodium 140   Lipid Panel    Component Value Date/Time   CHOL 137 11/12/2014 0921   CHOL  11/29/2008 0330    88        ATP III CLASSIFICATION:  <200     mg/dL   Desirable  200-239  mg/dL   Borderline High  >=240    mg/dL   High          TRIG 95 11/12/2014 0921   TRIG 52 11/29/2008 0330   HDL 50 11/12/2014 0921   HDL 34* 11/29/2008 0330   CHOLHDL 2.6 11/29/2008 0330   VLDL 10 11/29/2008 0330   LDLCALC 68 11/12/2014 0921   LDLCALC  11/29/2008 0330    44        Total Cholesterol/HDL:CHD Risk Coronary Heart Disease Risk Table                     Men   Women  1/2 Average Risk   3.4   3.3  Average Risk       5.0   4.4  2 X Average Risk   9.6   7.1  3 X Average Risk  23.4   11.0        Use the calculated Patient Ratio above and the CHD Risk Table to determine the patient's CHD Risk.        ATP III CLASSIFICATION (LDL):  <100     mg/dL   Optimal  100-129  mg/dL   Near or Above                    Optimal  130-159  mg/dL   Borderline  160-189  mg/dL   High  >190     mg/dL   Very High     Other studies Reviewed: Additional studies/ records that were reviewed today with results demonstrating: hospital records; echo shows EF 35-40%.   ASSESSMENT AND PLAN:  1. CAD: No angina. Continue aggressive  secondary prevention. Known left subclavian stenosis with a LIMA present. No anginal symptoms. He has been evaluated by Dr. Gwenlyn Found in the past. 2. Hyperlipidemia: LDL controlled in 2016. 3. HTN: Well controlled. Continue current medicines. 4. Edema: Continue to have LE edema.  Elevate legs and continue diuretics. 5. SHOB: Check Electrolytes and BNP.  Watch for fluid overload given chronic systolic dysfunction/ischemic cardiomyopathy. 6. Continue with routine defibrillator checks., Dr. Jens Som. 7. Paroxysmal Atrial fibrillation: Coumadin for stroke prevention.   Current medicines are reviewed at length with the patient today.  The patient concerns regarding his medicines were addressed.  The following changes have been made:  No change  Labs/ tests ordered today include:  No orders of the defined types were placed in this encounter.    Recommend 150 minutes/week of aerobic exercise Low fat, low carb, high fiber diet recommended  Disposition:   FU in 6 months   Signed, Larae Grooms, MD  03/12/2016 12:39 PM    [facility] [address], [place], [place]  [address] Phone: [phone]; Fax: [phone]

## 2016-03-12 NOTE — Patient Instructions (Signed)
**Note De-Identified [name] Obfuscation** Medication Instructions:  Same-no changes  Labwork: Today-BMET and BNP  Testing/Procedures: None  Follow-Up: Your physician wants you to follow-up in: 6 months. You will receive a reminder letter in the mail two months in advance. If you don't receive a letter, please call our office to schedule the follow-up appointment.      If you need a refill on your cardiac medications before your next appointment, please call your pharmacy.

## 2016-03-15 NOTE — Progress Notes (Signed)
EPIC Encounter for ICM Monitoring  Patient Name: [name] is a 82 y.o. male Date: 03/15/2016 Primary Care Physican: Irven Shelling, MD Primary Cardiologist: Irish Lack Electrophysiologist: Allred Dry Weight: unknown  Bi-V Pacing 79.2%      In the past month, have you:  1. Gained more than 2 pounds in a day or more than 5 pounds in a week? no  2. Had changes in your medications (with verification of current medications)? Yes, Furosemide decreased to 40 mg bid.  Can increase by 1 tablet to am dose if needed for fluid symptoms  3. Had more shortness of breath than is usual for you? Chronic SOB but no worse  4. Limited your activity because of shortness of breath? no  5. Not been able to sleep because of shortness of breath? no  6. Had increased swelling in your feet, ankles, legs or stomach area? no  7. Had symptoms of dehydration (dizziness, dry mouth, increased thirst, decreased urine output) no  8. Had changes in sodium restriction? no  9. Been compliant with medication? Yes  ICM trend: 3 month view for 03/11/2016   ICM trend: 1 year view for 03/11/2016   Follow-up plan: ICM clinic phone appointment 04/15/2016.  Spoke with wife, [name]  FLUID LEVELS:  Optivol thoracic impedance decreased 02/22/2016 to 02/25/2016 suggesting fluid accumulation and returned to baseline 02/26/2016.    SYMPTOMS:   No changes in symptoms at this time.  He has chronic SOB and has edema in one leg.  Advised to call for any fluid symptoms.   RECOMMENDATIONS: No changes today.    Rosalene Billings, RN, CCM 03/15/2016 4:18 PM

## 2016-03-18 DIAGNOSIS — Z7901 Long term (current) use of anticoagulants: Secondary | ICD-10-CM | POA: Diagnosis not present

## 2016-03-29 ENCOUNTER — Ambulatory Visit (INDEPENDENT_AMBULATORY_CARE_PROVIDER_SITE_OTHER): Payer: Medicare Other | Admitting: Pulmonary Disease

## 2016-03-29 ENCOUNTER — Encounter: Payer: Self-pay | Admitting: Pulmonary Disease

## 2016-03-29 VITALS — BP 130/72 | HR 80 | Ht 68.0 in | Wt 259.0 lb

## 2016-03-29 DIAGNOSIS — J441 Chronic obstructive pulmonary disease with (acute) exacerbation: Secondary | ICD-10-CM

## 2016-03-29 DIAGNOSIS — J439 Emphysema, unspecified: Secondary | ICD-10-CM | POA: Diagnosis not present

## 2016-03-29 DIAGNOSIS — G4733 Obstructive sleep apnea (adult) (pediatric): Secondary | ICD-10-CM | POA: Diagnosis not present

## 2016-03-29 DIAGNOSIS — Z9989 Dependence on other enabling machines and devices: Principal | ICD-10-CM

## 2016-03-29 DIAGNOSIS — I255 Ischemic cardiomyopathy: Secondary | ICD-10-CM

## 2016-03-29 MED ORDER — TIOTROPIUM BROMIDE MONOHYDRATE 2.5 MCG/ACT IN AERS: 2.0000 | INHALATION_SPRAY | Freq: Every day | RESPIRATORY_TRACT | Status: DC

## 2016-03-29 MED ORDER — PREDNISONE 10 MG PO TABS: ORAL_TABLET | ORAL | Status: DC

## 2016-03-29 NOTE — Patient Instructions (Signed)
Prednisone 10 mg pill >> 3 pills daily for 2 days, 2 pills daily for 2 days, 1 pill daily for 2 days  Will have your home care company assess function of your CPAP  Spiriva respimat two puffs daily  Follow up in 4 months

## 2016-03-29 NOTE — Progress Notes (Signed)
Current Outpatient Prescriptions on File Prior to Visit  Medication Sig  . albuterol (PROVENTIL HFA;VENTOLIN HFA) 108 (90 BASE) MCG/ACT inhaler Inhale 2 puffs into the lungs every 6 (six) hours as needed for wheezing or shortness of breath. Must keep June 2016 appt.  [name] albuterol (PROVENTIL) (2.5 MG/3ML) 0.083% nebulizer solution USE 1 VIAL VIA NEBULIZER 4 TIMES A DAY AS DIRECTED  . amiodarone (PACERONE) 200 MG tablet TAKE 1 TABLET BY MOUTH EVERY DAY  . aspirin 81 MG tablet Take 81 mg by mouth daily.    [name] atorvastatin (LIPITOR) 80 MG tablet TAKE 1 TABLET (80 MG TOTAL) BY MOUTH DAILY.  . carvedilol (COREG) 6.25 MG tablet TAKE 1 TABLET BY MOUTH TWICE A DAY  . DULERA 100-5 MCG/ACT AERO TAKE 2 PUFFS BY MOUTH TWICE A DAY  . furosemide (LASIX) 40 MG tablet Take 40 mg by mouth 2 (two) times daily.  [name] HYDROcodone-acetaminophen (NORCO/VICODIN) 5-325 MG per tablet Take 1 tablet by mouth daily as needed for moderate pain.   [name] insulin aspart (NOVOLOG) 100 UNIT/ML injection Inject 23 units into the skin before breakfast and lunch, inject 27units into the skin before supper  . levothyroxine (SYNTHROID, LEVOTHROID) 100 MCG tablet Take 100 mcg by mouth daily before breakfast.   . Multiple Vitamin (MULTIVITAMIN) tablet Take 1 tablet by mouth daily.    [name] omeprazole (PRILOSEC) 20 MG capsule Take 20 mg by mouth daily.    [name] senna-docusate (SENOKOT-S) 8.6-50 MG tablet Take 2 tablets by mouth at bedtime.  [name] spironolactone (ALDACTONE) 25 MG tablet TAKE 1/2 TABLET (12.5 MG TOTAL) BY MOUTH DAILY.  . traMADol (ULTRAM) 50 MG tablet Take 1 tablet by mouth daily as needed (pain).   [name] FLEXTOUCH 100 UNIT/ML SOPN Inject 70 Units into the skin daily. MORNINGS  . vitamin B-12 (CYANOCOBALAMIN) 1000 MCG tablet Take 1,000 mcg by mouth daily.  [name] warfarin (COUMADIN) 5 MG tablet Take 5mg  everyday except on Tuesday and Thursdays take 7.5mg  Please hold coumadin on 3/25, restart coumadin on 3/26, check INR on Monday 3/27.  [name] ZETIA  10 MG tablet TAKE 1 TABLET BY MOUTH EVERY DAY   No current facility-administered medications on file prior to visit.    Chief Complaint  Patient presents with  . Follow-up    Pt denies any current SOB or breathing issues. Wears CPAP machine nighty. Denies problems with mask or pressure. DME: [facility]    Tests PSG 06/14/06>>AHI 35.5  PFT 03/30/11>>FEV1 1.98(72%), FEV1% 68, DLCO 64%  PFT 04/19/12>>FEV1 1.65 (67%), FEV1% 63, TLC 5.66 (99%), DLCO 71%, no BD. Echo 10/03/13 >> EF 40 to 45%, mod LVH, grade 1 diastolic dysfx  Auto CPAP 09/08/13 to 12/06/13 >> Used on 83 of 90 nights with average 6 hrs 53 min.  Average AHI 17.8 with median CPAP 9 cm H2O and 95 th percentile CPAP 12 cm H2O.  CI 9, OI 1.9, HI 6.5. PFT 12/19/13 >> FEV1 2.03 (77%), FEV1% 72, TLC 6.45 (98%), DLCO 49% CT chest 01/18/14 >> mild centrilobular and paraseptal emphysema, mild fibrotic changes Rt periphery  Past medical history CAD, systolic CHF, PAD, HTN, DM, HLD, A fib, Hypothyroidism, BPH, Psoriasis, GERD  Past surgical history, Family history, Social history, Allergies reviewed.  Vital signs BP 130/72 mmHg  Pulse 97  Ht 5\' 8"  (1.727 m)  Wt 259 lb (117.482 kg)  BMI 39.39 kg/m2  SpO2 80%   History of Present Illness: [name] is a 82 y.o. male former smoker with COPD/Emphysema, and OSA.  He has continue cough and wheeze.  He uses albuterol several times per day.  He is not having fever, chest pain, or swelling.  He says he uses CPAP every night >> confirmed by his wife.  However his download only shows 1/3 of nights with use and no information since April 2017.  Physical Exam:  General - No distress ENT - No sinus tenderness, no oral exudate, changes of cleft lip surgery, nasal septal deviation, no LAN Cardiac - s1s2 regular, no murmur Chest - b/l expiratory wheezing Back - No focal tenderness Abd - Soft, non-tender Ext - No edema Neuro - Normal strength Skin - No rashes Psych - normal mood, and  behavior   Assessment/Plan:  COPD exacerbation. - don't think he needs Abx or CXR - will give course of prednisone  COPD with emphysema. - will add spriva respimat - continue dulera and prn albuterol - discussed proper indications for his inhalers - advised he could try using mucinex to help loosen phlegm  Obstructive sleep apnea. - will have his DME assess function of his CPAP > not sure why his download is not providing more information - continue auto CPAP   Patient Instructions  Prednisone 10 mg pill >> 3 pills daily for 2 days, 2 pills daily for 2 days, 1 pill daily for 2 days  Will have your home care company assess function of your CPAP  Spiriva respimat two puffs daily  Follow up in 4 months    Chesley Mires, MD [facility] Pager:  [phone] 03/29/2016, 2:55 PM

## 2016-03-31 DIAGNOSIS — E119 Type 2 diabetes mellitus without complications: Secondary | ICD-10-CM | POA: Diagnosis not present

## 2016-03-31 DIAGNOSIS — H43811 Vitreous degeneration, right eye: Secondary | ICD-10-CM | POA: Diagnosis not present

## 2016-03-31 DIAGNOSIS — H353131 Nonexudative age-related macular degeneration, bilateral, early dry stage: Secondary | ICD-10-CM | POA: Diagnosis not present

## 2016-03-31 DIAGNOSIS — H26491 Other secondary cataract, right eye: Secondary | ICD-10-CM | POA: Diagnosis not present

## 2016-03-31 DIAGNOSIS — Z961 Presence of intraocular lens: Secondary | ICD-10-CM | POA: Diagnosis not present

## 2016-04-02 ENCOUNTER — Ambulatory Visit (INDEPENDENT_AMBULATORY_CARE_PROVIDER_SITE_OTHER): Payer: Medicare Other | Admitting: *Deleted

## 2016-04-02 DIAGNOSIS — I480 Paroxysmal atrial fibrillation: Secondary | ICD-10-CM | POA: Diagnosis not present

## 2016-04-02 DIAGNOSIS — I4891 Unspecified atrial fibrillation: Secondary | ICD-10-CM

## 2016-04-02 DIAGNOSIS — Z7901 Long term (current) use of anticoagulants: Secondary | ICD-10-CM | POA: Diagnosis not present

## 2016-04-02 LAB — POCT INR: INR: 2.7

## 2016-04-08 DIAGNOSIS — L602 Onychogryphosis: Secondary | ICD-10-CM | POA: Diagnosis not present

## 2016-04-08 DIAGNOSIS — I70293 Other atherosclerosis of native arteries of extremities, bilateral legs: Secondary | ICD-10-CM | POA: Diagnosis not present

## 2016-04-08 DIAGNOSIS — E1351 Other specified diabetes mellitus with diabetic peripheral angiopathy without gangrene: Secondary | ICD-10-CM | POA: Diagnosis not present

## 2016-04-08 DIAGNOSIS — L84 Corns and callosities: Secondary | ICD-10-CM | POA: Diagnosis not present

## 2016-04-15 ENCOUNTER — Ambulatory Visit (INDEPENDENT_AMBULATORY_CARE_PROVIDER_SITE_OTHER): Payer: Medicare Other

## 2016-04-15 DIAGNOSIS — I5022 Chronic systolic (congestive) heart failure: Secondary | ICD-10-CM | POA: Diagnosis not present

## 2016-04-15 DIAGNOSIS — Z95810 Presence of automatic (implantable) cardiac defibrillator: Secondary | ICD-10-CM

## 2016-04-15 DIAGNOSIS — Z7901 Long term (current) use of anticoagulants: Secondary | ICD-10-CM | POA: Diagnosis not present

## 2016-04-16 ENCOUNTER — Telehealth: Payer: Self-pay

## 2016-04-16 NOTE — Telephone Encounter (Signed)
Remote ICM transmission received.  Attempted call to wife, [name] and left message for return call.

## 2016-04-16 NOTE — Progress Notes (Signed)
EPIC Encounter for ICM Monitoring  Patient Name: [name] is a 82 y.o. male Date: 04/16/2016 Primary Care Physican: Irven Shelling, MD Primary Cardiologist: Irish Lack Electrophysiologist: Allred Dry Weight:  252 lbs  Bi-V Pacing 79.8% (was 97.9% on 01/22/2016)  In the past month, have you:  1. Gained more than 2 pounds in a day or more than 5 pounds in a week? No  2. Had changes in your medications (with verification of current medications)? No  3. Had more shortness of breath than is usual for you? No  4. Limited your activity because of shortness of breath? No  5. Not been able to sleep because of shortness of breath? No  6. Had increased swelling in your feet, ankles, legs or stomach area? No  7. Had symptoms of dehydration (dizziness, dry mouth, increased thirst, decreased urine output) No  8. Had changes in sodium restriction? No  9. Been compliant with medication? No  ICM trend: 3 month view for 04/15/2016   ICM trend: 1 year view for 04/15/2016   Follow-up plan: ICM clinic phone appointment 05/17/2046.  Spoke with wife and patient.  FLUID LEVELS:  Optivol thoracic impedance decreased 03/13/2016 to 04/01/2016 and 04/03/2016 to 04/10/2016 suggesting fluid accumulation and returned to baseline 04/10/2016.    SYMPTOMS:  Patient reported feeling weaker in the last couple of months.  Advised he was due to office defib check and encouraged him to make an appointment soon.  Advised transmission showed he has had some Afib and the only thing that has changed it feeling weaker.    Transmission shows increase in number of days in AT/AF since April.  Transmission 02/11/2016 was 5 days in AT/AF to 35 days in AT/AF on 04/15/2016.  V sensing Episodes increased from 431 on 02/11/2016 to 1071 on 04/15/2016.    Will send to PCP, [name] and Dr. Rayann Heman for review regarding decrease in BiV pacing and increase in AT/AF since April 2017.   If any recommendations, will call back.     Rosalene Billings, RN, CCM 04/16/2016 8:54 AM

## 2016-04-19 ENCOUNTER — Other Ambulatory Visit: Payer: Self-pay | Admitting: Pulmonary Disease

## 2016-04-19 ENCOUNTER — Other Ambulatory Visit: Payer: Self-pay | Admitting: Cardiology

## 2016-04-20 NOTE — Progress Notes (Signed)
Electrophysiology Office Note Date: 04/21/2016  ID:  [name], DOB 19-Sep-1934, MRN [id number]  PCP: Irven Shelling, MD Primary Cardiologist: Irish Lack Electrophysiologist: Allred  CC: follow up increased AF burden  [name] is a 82 y.o. male seen today after recent remote demonstrated persistent atrial fibrillation for the last 2 months associated with decreased CRT pacing.  He presents today for routine electrophysiology followup.  Since last being seen in our clinic, the patient reports doing reasonably well.  However, he has noticed increased shortness of breath and fatigue associated with recurrent AF.His blood sugar was low last night and he ate a lot of sweets with subsequent vomiting and diarrhea.  He has not had fevers or chills.   He denies chest pain, palpitations, PND, orthopnea, dizziness, syncope, edema, weight gain, or early satiety.  He has not had ICD shocks.    Device History: MDT CRTD implanted 2009 for ICM, CHF History of appropriate therapy: No History of AAD therapy: yes - amiodarone for AF    Past Medical History  Diagnosis Date  . Dyspnea   . OSA (obstructive sleep apnea)   . HTN (hypertension)   . DM (diabetes mellitus), type 2 ([place])   . Hyperlipidemia   . Systolic congestive heart failure ([place]) 2009    s/p BiV ICD implantation by Dr Leonia Reeves (MDT)  . Atrial fibrillation ([place])     persistent, previously seen at [facility] and placed on amiodarone  . Hypothyroidism   . PNA (pneumonia)   . Cleft palate   . Nasal septal deviation   . Iron deficiency anemia   . GERD (gastroesophageal reflux disease)   . Benign prostatic hypertrophy   . Nephrolithiasis   . Psoriasis   . Seborrheic keratosis   . COPD with emphysema ([place]) 04/01/2010  . CAD (coronary artery disease)     multivessel s/p inferolateral wall MI with subsequent CABG 11/1998.  Cath 2009 with Patent grafts  . Ischemic dilated cardiomyopathy     EF 35-40% by MUGA 6/11  . PAF  (paroxysmal atrial fibrillation) ([place])   . Peripheral arterial disease ([facility])     left subclavian artery stenosis  . Stroke ([place])   . Myocardial infarction [facility])    Past Surgical History  Procedure Laterality Date  . Carpal tunnel release    . Left cleft palate and left cleft lip repair    . Cataract extraction    . C-spine surgery    . Bi-ventricular implantable cardioverter defibrillator  (crt-d)  10-08-08; 11-06-2013    Dr Leonia Reeves (MDT) implant for primary prevention; gen change to MDT VivaXT CRTD by Dr Rayann Heman  . Coronary artery bypass graft      LIMA to LAD, SVG to OM, SVG to diagonal  . Biv icd genertaor change out N/A 11/06/2013    Procedure: BIV ICD Section;  Surgeon: Coralyn Mark, MD;  Location: [facility] CATH LAB;  Service: Cardiovascular;  Laterality: N/A;    Current Outpatient Prescriptions  Medication Sig Dispense Refill  . albuterol (PROVENTIL HFA;VENTOLIN HFA) 108 (90 BASE) MCG/ACT inhaler Inhale 2 puffs into the lungs every 6 (six) hours as needed for wheezing or shortness of breath. Must keep June 2016 appt. 1 Inhaler 0  . albuterol (PROVENTIL) (2.5 MG/3ML) 0.083% nebulizer solution USE 1 VIAL VIA NEBULIZER 4 TIMES A DAY AS DIRECTED 300 mL 2  . amiodarone (PACERONE) 200 MG tablet TAKE 1 TABLET BY MOUTH EVERY DAY 30 tablet 9  . aspirin 81 MG  tablet Take 81 mg by mouth daily.      [name] atorvastatin (LIPITOR) 80 MG tablet TAKE 1 TABLET (80 MG TOTAL) BY MOUTH DAILY. 90 tablet 2  . carvedilol (COREG) 6.25 MG tablet TAKE 1 TABLET BY MOUTH TWICE A DAY 180 tablet 2  . DULERA 100-5 MCG/ACT AERO TAKE 2 PUFFS BY MOUTH TWICE A DAY 1 Inhaler 6  . furosemide (LASIX) 40 MG tablet Take 40 mg by mouth 2 (two) times daily.    [name] HYDROcodone-acetaminophen (NORCO/VICODIN) 5-325 MG per tablet Take 1 tablet by mouth daily as needed for moderate pain.     [name] insulin aspart (NOVOLOG) 100 UNIT/ML injection Inject 23 units into the skin before breakfast and lunch, inject 27units into the skin  before supper    . levothyroxine (SYNTHROID, LEVOTHROID) 100 MCG tablet Take 100 mcg by mouth daily before breakfast.     . Multiple Vitamin (MULTIVITAMIN) tablet Take 1 tablet by mouth daily.      [name] omeprazole (PRILOSEC) 20 MG capsule Take 20 mg by mouth daily.      [name] senna-docusate (SENOKOT-S) 8.6-50 MG tablet Take 2 tablets by mouth at bedtime. 30 tablet 0  . spironolactone (ALDACTONE) 25 MG tablet TAKE 1/2 TABLET (12.5 MG TOTAL) BY MOUTH DAILY. 45 tablet 10  . Tiotropium Bromide Monohydrate (SPIRIVA RESPIMAT) 2.5 MCG/ACT AERS Inhale 2 puffs into the lungs daily. 1 Inhaler 5  . traMADol (ULTRAM) 50 MG tablet Take 1 tablet by mouth daily as needed (pain).     [name] FLEXTOUCH 100 UNIT/ML SOPN Inject 70 Units into the skin daily. MORNINGS  3  . vitamin B-12 (CYANOCOBALAMIN) 1000 MCG tablet Take 1,000 mcg by mouth daily.    [name] warfarin (COUMADIN) 5 MG tablet Take 5mg  everyday except on Tuesday and Thursdays take 7.5mg  Please hold coumadin on 3/25, restart coumadin on 3/26, check INR on Monday 3/27. 40 tablet 3  . warfarin (COUMADIN) 5 MG tablet TAKE AS DIRECTED BY COUMADIN CLINIC 40 tablet 3  . ZETIA 10 MG tablet TAKE 1 TABLET BY MOUTH EVERY DAY 90 tablet 1   No current facility-administered medications for this visit.    Allergies:   Review of patient's allergies indicates no known allergies.   Social History: Social History   Social History  . Marital Status: Married    Spouse Name: N/A  . Number of Children: N/A  . Years of Education: N/A   Occupational History  . retired     Engineer, agricultural   Social History Main Topics  . Smoking status: Former Smoker -- 3.00 packs/day for 65 years    Types: Cigarettes    Quit date: 10/25/1998  . Smokeless tobacco: Never Used  . Alcohol Use: No     Comment: remote history of heavy alcohol use  . Drug Use: No  . Sexual Activity: Not on file   Other Topics Concern  . Not on file   Social History Narrative   Lives [place]    Retired    Family History: Family History  Problem Relation Age of Onset  . Asthma Father   . Stroke Father   . Heart disease Brother   . Heart attack Brother   . Hypertension Mother   . Hypertension Father   . Hypertension Sister   . Hypertension Brother     Review of Systems: All other systems reviewed and are otherwise negative except as noted above.   Physical Exam: VS:  BP 124/68 mmHg  Pulse 88  Ht 5\' 8"  (1.727 m)  Wt 257 lb 12.8 oz (116.937 kg)  BMI 39.21 kg/m2 , BMI Body mass index is 39.21 kg/(m^2).  GEN- The patient is obese appearing, alert and oriented x 3 today.   HEENT: normocephalic, atraumatic; sclera clear, conjunctiva pink; hearing intact; oropharynx clear; neck supple  Lungs- Clear to ausculation bilaterally, normal work of breathing.  No wheezes, rales, rhonchi Heart- Regular rate and rhythm (paced) GI- soft, non-tender, non-distended, bowel sounds present  Extremities- no clubbing, cyanosis, or edema; DP/PT/radial pulses 2+ bilaterally MS- no significant deformity or atrophy Skin- warm and dry, no rash or lesion; ICD pocket well healed Psych- euthymic mood, full affect Neuro- strength and sensation are intact  ICD interrogation- reviewed in detail today,  See PACEART report  EKG:  EKG is ordered today. The ekg ordered today shows atrial fibrillation with CRT pacing   Recent Labs: 01/11/2016: ALT 25 01/15/2016: TSH 1.160 01/16/2016: Hemoglobin 11.2*; Platelets 236 03/12/2016: Brain Natriuretic Peptide 262.8*; BUN 27*; Creat 1.54*; Potassium 4.1; Sodium 139   Wt Readings from Last 3 Encounters:  04/21/16 257 lb 12.8 oz (116.937 kg)  03/29/16 259 lb (117.482 kg)  03/12/16 257 lb (116.574 kg)     Other studies Reviewed: Additional studies/ records that were reviewed today include: Dr Irish Lack and Dr Juanetta Gosling office notes  Assessment and Plan:  1.  Persistent atrial fibrillation Recurrent and identified on recent remote transmission Pt is  symptomatic with fatigue and increased shortness of breath He has decreased CRT pacing 2/2 AF and would recommend restoration of SR at this time.  Increase amiodarone to 400mg  bid x 1 week then 400mg  daily for 2 weeks then back to 200mg  daily. Will notify CVRR. Risks, benefits to cardioversion discussed with patient who wishes to proceed. Will schedule at the next available time.  LA 52 but patient has been maintaining SR until the last month  Continue Warfarin for CHADS2VASC of 8 - INR's therapeutic for the last month  2. Chronic systolic dysfunction Slightly volume overloaded on exam - restoration of SR should help  Stable on an appropriate medical regimen Normal ICD function See Pace Art report No changes today  3.  OSA CPAP compliance encouraged  4.  CAD No recent ischemic symptoms Will defer need for concomitant ASA to Dr Irish Lack  5.  HTN Stable No change required today  6.  Obesity Weight loss encouraged  Current medicines are reviewed at length with the patient today.   The patient does not have concerns regarding his medicines.  The following changes were made today:  none  Labs/ tests ordered today include: CBC, BMET, TSH, LFT's, INR    Disposition:   Follow up with EP NP 3-4 weeks    Signed, Thompson Grayer, MD   04/21/2016 9:23 AM  [facility] [place] [address] [phone] (office) [phone] (fax)

## 2016-04-21 ENCOUNTER — Ambulatory Visit (INDEPENDENT_AMBULATORY_CARE_PROVIDER_SITE_OTHER): Payer: Medicare Other | Admitting: Internal Medicine

## 2016-04-21 ENCOUNTER — Encounter: Payer: Self-pay | Admitting: Internal Medicine

## 2016-04-21 VITALS — BP 124/68 | HR 88 | Ht 68.0 in | Wt 257.8 lb

## 2016-04-21 DIAGNOSIS — I480 Paroxysmal atrial fibrillation: Secondary | ICD-10-CM | POA: Diagnosis not present

## 2016-04-21 DIAGNOSIS — I255 Ischemic cardiomyopathy: Secondary | ICD-10-CM

## 2016-04-21 DIAGNOSIS — I481 Persistent atrial fibrillation: Secondary | ICD-10-CM | POA: Diagnosis not present

## 2016-04-21 DIAGNOSIS — Z9989 Dependence on other enabling machines and devices: Secondary | ICD-10-CM

## 2016-04-21 DIAGNOSIS — I10 Essential (primary) hypertension: Secondary | ICD-10-CM | POA: Diagnosis not present

## 2016-04-21 DIAGNOSIS — I4819 Other persistent atrial fibrillation: Secondary | ICD-10-CM

## 2016-04-21 DIAGNOSIS — I5022 Chronic systolic (congestive) heart failure: Secondary | ICD-10-CM | POA: Diagnosis not present

## 2016-04-21 DIAGNOSIS — G4733 Obstructive sleep apnea (adult) (pediatric): Secondary | ICD-10-CM

## 2016-04-21 LAB — BASIC METABOLIC PANEL
BUN: 22 mg/dL (ref 7–25)
CO2: 29 mmol/L (ref 20–31)
Calcium: 9.1 mg/dL (ref 8.6–10.3)
Chloride: 100 mmol/L (ref 98–110)
Creat: 1.52 mg/dL — ABNORMAL HIGH (ref 0.70–1.11)
Glucose, Bld: 77 mg/dL (ref 65–99)
Potassium: 4.5 mmol/L (ref 3.5–5.3)
Sodium: 138 mmol/L (ref 135–146)

## 2016-04-21 LAB — HEPATIC FUNCTION PANEL
ALT: 16 U/L (ref 9–46)
AST: 23 U/L (ref 10–35)
Albumin: 3.7 g/dL (ref 3.6–5.1)
Alkaline Phosphatase: 75 U/L (ref 40–115)
Bilirubin, Direct: 0.2 mg/dL (ref <=0.2)
Indirect Bilirubin: 0.4 mg/dL (ref 0.2–1.2)
Total Bilirubin: 0.6 mg/dL (ref 0.2–1.2)
Total Protein: 6.3 g/dL (ref 6.1–8.1)

## 2016-04-21 LAB — CBC WITH DIFFERENTIAL/PLATELET
Basophils Absolute: 0 cells/uL (ref 0–200)
Basophils Relative: 0 %
Eosinophils Absolute: 188 cells/uL (ref 15–500)
Eosinophils Relative: 2 %
HCT: 33.6 % — ABNORMAL LOW (ref 38.5–50.0)
Hemoglobin: 10.9 g/dL — ABNORMAL LOW (ref 13.2–17.1)
Lymphocytes Relative: 19 %
Lymphs Abs: 1786 cells/uL (ref 850–3900)
MCH: 23.9 pg — ABNORMAL LOW (ref 27.0–33.0)
MCHC: 32.4 g/dL (ref 32.0–36.0)
MCV: 73.5 fL — ABNORMAL LOW (ref 80.0–100.0)
MPV: 9.1 fL (ref 7.5–12.5)
Monocytes Absolute: 846 cells/uL (ref 200–950)
Monocytes Relative: 9 %
Neutro Abs: 6580 cells/uL (ref 1500–7800)
Neutrophils Relative %: 70 %
Platelets: 295 10*3/uL (ref 140–400)
RBC: 4.57 MIL/uL (ref 4.20–5.80)
RDW: 18.1 % — ABNORMAL HIGH (ref 11.0–15.0)
WBC: 9.4 10*3/uL (ref 3.8–10.8)

## 2016-04-21 LAB — PROTIME-INR
INR: 2.5 — ABNORMAL HIGH
Prothrombin Time: 25.1 s — ABNORMAL HIGH (ref 9.0–11.5)

## 2016-04-21 LAB — TSH: TSH: 2.14 mIU/L (ref 0.40–4.50)

## 2016-04-21 MED ORDER — AMIODARONE HCL 200 MG PO TABS: ORAL_TABLET | ORAL | Status: DC

## 2016-04-21 NOTE — Patient Instructions (Addendum)
Medication Instructions:  Your physician has recommended you make the following change in your medication:  1) Increase Amiodarone to 400mg  twice daily for 1 week, then decrease to 400 mg once daily for 2 weeks, then decrease to 200 mg daily   Labwork: Your physician recommends that you return for lab work today: BMP/CBC   Testing/Procedures: Your physician has recommended that you have a Cardioversion (DCCV). Electrical Cardioversion uses a jolt of electricity to your heart either through paddles or wired patches attached to your chest. This is a controlled, usually prescheduled, procedure. Defibrillation is done under light anesthesia in the hospital, and you usually go home the day of the procedure. This is done to get your heart back into a normal rhythm. You are not awake for the procedure. Please see the instruction sheet given to you today.--04/29/16 with Dr Debara Pickett  Please arrive at the [facility] at 9:30am Do not eat or drink after midnight the night the night prior to the procedure Okay to take medications the morning of the procedure --HOLD Furosemide    Follow-Up: Your physician recommends that you schedule a follow-up appointment in: 3-4 weeks with Chanetta Marshall, NP    Any Other Special Instructions Will Be Listed Below (If Applicable).     If you need a refill on your cardiac medications before your next appointment, please call your pharmacy.

## 2016-04-22 ENCOUNTER — Telehealth: Payer: Self-pay | Admitting: *Deleted

## 2016-04-22 NOTE — Telephone Encounter (Signed)
-----   Message from Patsey Berthold, NP sent at 04/22/2016  7:38 AM EDT ----- Please notify patient of stable labs

## 2016-04-28 ENCOUNTER — Ambulatory Visit (INDEPENDENT_AMBULATORY_CARE_PROVIDER_SITE_OTHER): Payer: Medicare Other | Admitting: *Deleted

## 2016-04-28 ENCOUNTER — Encounter (INDEPENDENT_AMBULATORY_CARE_PROVIDER_SITE_OTHER): Payer: Self-pay

## 2016-04-28 DIAGNOSIS — Z7901 Long term (current) use of anticoagulants: Secondary | ICD-10-CM | POA: Diagnosis not present

## 2016-04-28 DIAGNOSIS — I480 Paroxysmal atrial fibrillation: Secondary | ICD-10-CM | POA: Diagnosis not present

## 2016-04-28 DIAGNOSIS — I4891 Unspecified atrial fibrillation: Secondary | ICD-10-CM

## 2016-04-28 LAB — POCT INR: INR: 2.9

## 2016-04-29 ENCOUNTER — Ambulatory Visit (HOSPITAL_COMMUNITY): Payer: Medicare Other | Admitting: Anesthesiology

## 2016-04-29 ENCOUNTER — Ambulatory Visit (HOSPITAL_COMMUNITY)
Admission: RE | Admit: 2016-04-29 | Discharge: 2016-04-29 | Disposition: A | Discharge status: Home / Self Care | Payer: Medicare Other | Source: Ambulatory Visit | Attending: Internal Medicine | Admitting: Internal Medicine

## 2016-04-29 ENCOUNTER — Encounter (HOSPITAL_COMMUNITY): Payer: Self-pay | Admitting: Internal Medicine

## 2016-04-29 ENCOUNTER — Encounter (HOSPITAL_COMMUNITY)
Admission: RE | Disposition: A | Discharge status: Home / Self Care | Payer: Self-pay | Source: Ambulatory Visit | Attending: Internal Medicine

## 2016-04-29 DIAGNOSIS — Z95810 Presence of automatic (implantable) cardiac defibrillator: Secondary | ICD-10-CM | POA: Insufficient documentation

## 2016-04-29 DIAGNOSIS — I110 Hypertensive heart disease with heart failure: Secondary | ICD-10-CM | POA: Insufficient documentation

## 2016-04-29 DIAGNOSIS — Z951 Presence of aortocoronary bypass graft: Secondary | ICD-10-CM | POA: Insufficient documentation

## 2016-04-29 DIAGNOSIS — Z7951 Long term (current) use of inhaled steroids: Secondary | ICD-10-CM | POA: Insufficient documentation

## 2016-04-29 DIAGNOSIS — E119 Type 2 diabetes mellitus without complications: Secondary | ICD-10-CM | POA: Insufficient documentation

## 2016-04-29 DIAGNOSIS — Z794 Long term (current) use of insulin: Secondary | ICD-10-CM | POA: Insufficient documentation

## 2016-04-29 DIAGNOSIS — I4819 Other persistent atrial fibrillation: Secondary | ICD-10-CM | POA: Insufficient documentation

## 2016-04-29 DIAGNOSIS — Z8673 Personal history of transient ischemic attack (TIA), and cerebral infarction without residual deficits: Secondary | ICD-10-CM | POA: Diagnosis not present

## 2016-04-29 DIAGNOSIS — L089 Local infection of the skin and subcutaneous tissue, unspecified: Secondary | ICD-10-CM | POA: Diagnosis not present

## 2016-04-29 DIAGNOSIS — G4733 Obstructive sleep apnea (adult) (pediatric): Secondary | ICD-10-CM | POA: Insufficient documentation

## 2016-04-29 DIAGNOSIS — I420 Dilated cardiomyopathy: Secondary | ICD-10-CM | POA: Diagnosis not present

## 2016-04-29 DIAGNOSIS — Z7901 Long term (current) use of anticoagulants: Secondary | ICD-10-CM | POA: Diagnosis not present

## 2016-04-29 DIAGNOSIS — K219 Gastro-esophageal reflux disease without esophagitis: Secondary | ICD-10-CM | POA: Insufficient documentation

## 2016-04-29 DIAGNOSIS — E039 Hypothyroidism, unspecified: Secondary | ICD-10-CM | POA: Diagnosis not present

## 2016-04-29 DIAGNOSIS — L539 Erythematous condition, unspecified: Secondary | ICD-10-CM | POA: Diagnosis not present

## 2016-04-29 DIAGNOSIS — I2510 Atherosclerotic heart disease of native coronary artery without angina pectoris: Secondary | ICD-10-CM | POA: Diagnosis not present

## 2016-04-29 DIAGNOSIS — Z6838 Body mass index (BMI) 38.0-38.9, adult: Secondary | ICD-10-CM | POA: Insufficient documentation

## 2016-04-29 DIAGNOSIS — I255 Ischemic cardiomyopathy: Secondary | ICD-10-CM | POA: Insufficient documentation

## 2016-04-29 DIAGNOSIS — I4891 Unspecified atrial fibrillation: Secondary | ICD-10-CM | POA: Diagnosis not present

## 2016-04-29 DIAGNOSIS — Z79899 Other long term (current) drug therapy: Secondary | ICD-10-CM | POA: Diagnosis not present

## 2016-04-29 DIAGNOSIS — Z7982 Long term (current) use of aspirin: Secondary | ICD-10-CM | POA: Insufficient documentation

## 2016-04-29 DIAGNOSIS — J449 Chronic obstructive pulmonary disease, unspecified: Secondary | ICD-10-CM | POA: Insufficient documentation

## 2016-04-29 DIAGNOSIS — E785 Hyperlipidemia, unspecified: Secondary | ICD-10-CM | POA: Insufficient documentation

## 2016-04-29 DIAGNOSIS — I5022 Chronic systolic (congestive) heart failure: Secondary | ICD-10-CM | POA: Diagnosis not present

## 2016-04-29 DIAGNOSIS — I481 Persistent atrial fibrillation: Secondary | ICD-10-CM | POA: Diagnosis not present

## 2016-04-29 DIAGNOSIS — I252 Old myocardial infarction: Secondary | ICD-10-CM | POA: Diagnosis not present

## 2016-04-29 DIAGNOSIS — I10 Essential (primary) hypertension: Secondary | ICD-10-CM | POA: Diagnosis not present

## 2016-04-29 HISTORY — PX: CARDIOVERSION: SHX1299

## 2016-04-29 LAB — GLUCOSE, CAPILLARY: Glucose-Capillary: 136 mg/dL — ABNORMAL HIGH (ref 65–99)

## 2016-04-29 SURGERY — CARDIOVERSION
Anesthesia: General

## 2016-04-29 MED ORDER — PROPOFOL 10 MG/ML IV BOLUS
INTRAVENOUS | Status: DC | PRN
  Administered 2016-04-29: 80 mg via INTRAVENOUS

## 2016-04-29 MED ORDER — SODIUM CHLORIDE 0.9 % IV SOLN
INTRAVENOUS | Status: DC
  Administered 2016-04-29 (×2): via INTRAVENOUS

## 2016-04-29 MED ORDER — LIDOCAINE HCL (CARDIAC) 20 MG/ML IV SOLN
INTRAVENOUS | Status: DC | PRN
  Administered 2016-04-29: 100 mg via INTRATRACHEAL

## 2016-04-29 MED ORDER — ALBUTEROL SULFATE (2.5 MG/3ML) 0.083% IN NEBU
2.5000 mg | INHALATION_SOLUTION | Freq: Once | RESPIRATORY_TRACT | Status: AC
  Administered 2016-04-29: 2.5 mg via RESPIRATORY_TRACT

## 2016-04-29 MED ORDER — ALBUTEROL SULFATE (2.5 MG/3ML) 0.083% IN NEBU
INHALATION_SOLUTION | RESPIRATORY_TRACT | Status: AC
  Filled 2016-04-29: qty 3

## 2016-04-29 NOTE — Discharge Instructions (Signed)
Electrical Cardioversion °Electrical cardioversion is the delivery of a jolt of electricity to change the rhythm of the heart. Sticky patches or metal paddles are placed on the chest to deliver the electricity from a device. This is done to restore a normal rhythm. A rhythm that is too fast or not regular keeps the heart from pumping well. °Electrical cardioversion is done in an emergency if:  °· There is low or no blood pressure as a result of the heart rhythm.   °· Normal rhythm must be restored as fast as possible to protect the brain and heart from further damage.   °· It may save a life. °Cardioversion may be done for heart rhythms that are not immediately life threatening, such as atrial fibrillation or flutter, in which:  °· The heart is beating too fast or is not regular.   °· Medicine to change the rhythm has not worked.   °· It is safe to wait in order to allow time for preparation. °· Symptoms of the abnormal rhythm are bothersome. °· The risk of stroke and other serious problems can be reduced. °LET YOUR HEALTH CARE PROVIDER KNOW ABOUT:  °· Any allergies you have. °· All medicines you are taking, including vitamins, herbs, eye drops, creams, and over-the-counter medicines. °· Previous problems you or members of your family have had with the use of anesthetics.   °· Any blood disorders you have.   °· Previous surgeries you have had.   °· Medical conditions you have. °RISKS AND COMPLICATIONS  °Generally, this is a safe procedure. However, problems can occur and include:  °· Breathing problems related to the anesthetic used. °· A blood clot that breaks free and travels to other parts of your body. This could cause a stroke or other problems. The risk of this is lowered by use of blood-thinning medicine (anticoagulant) prior to the procedure. °· Cardiac arrest (rare). °BEFORE THE PROCEDURE  °· You may have tests to detect blood clots in your heart and to evaluate heart function.  °· You may start taking  anticoagulants so your blood does not clot as easily.   °· Medicines may be given to help stabilize your heart rate and rhythm. °PROCEDURE °· You will be given medicine through an IV tube to reduce discomfort and make you sleepy (sedative).   °· An electrical shock will be delivered. °AFTER THE PROCEDURE °Your heart rhythm will be watched to make sure it does not change.  °  °This information is not intended to replace advice given to you by your health care provider. Make sure you discuss any questions you have with your health care provider. °  °Document Released: 10/01/2002 Document Revised: 11/01/2014 Document Reviewed: 04/25/2013 °Elsevier Interactive Patient Education ©2016 [employer] ° °

## 2016-04-29 NOTE — Anesthesia Preprocedure Evaluation (Addendum)
Anesthesia Evaluation  Patient identified by MRN, date of birth, ID band Patient awake    Reviewed: Allergy & Precautions, NPO status , Patient's Chart, lab work & pertinent test results  Airway Mallampati: II  TM Distance: >3 FB Neck ROM: Full    Dental no notable dental hx.    Pulmonary neg pulmonary ROS, shortness of breath, sleep apnea , pneumonia, COPD, former smoker,    Pulmonary exam normal breath sounds clear to auscultation       Cardiovascular hypertension, Pt. on medications + CAD, + Past MI, + Peripheral Vascular Disease and +CHF  Normal cardiovascular exam+ Cardiac Defibrillator  Rhythm:Regular Rate:Normal     Neuro/Psych CVA negative psych ROS   GI/Hepatic Neg liver ROS, GERD  ,  Endo/Other  diabetesHypothyroidism Morbid obesity  Renal/GU Renal disease     Musculoskeletal negative musculoskeletal ROS (+)   Abdominal   Peds  Hematology  (+) anemia ,   Anesthesia Other Findings   Reproductive/Obstetrics negative OB ROS                             Anesthesia Physical Anesthesia Plan  ASA: IV  Anesthesia Plan: General   Post-op Pain Management:    Induction: Intravenous  Airway Management Planned:   Additional Equipment:   Intra-op Plan:   Post-operative Plan:   Informed Consent: I have reviewed the patients History and Physical, chart, labs and discussed the procedure including the risks, benefits and alternatives for the proposed anesthesia with the patient or authorized representative who has indicated his/her understanding and acceptance.   Dental advisory given  Plan Discussed with: CRNA  Anesthesia Plan Comments:         Anesthesia Quick Evaluation

## 2016-04-29 NOTE — Transfer of Care (Signed)
Immediate Anesthesia Transfer of Care Note  Patient: [name]  Procedure(s) Performed: Procedure(s): CARDIOVERSION (N/A)  Patient Location: Endoscopy Unit  Anesthesia Type:MAC  Level of Consciousness: sedated and patient cooperative  Airway & Oxygen Therapy: Patient Spontanous Breathing and Patient connected to nasal cannula oxygen  Post-op Assessment: Report given to RN and Post -op Vital signs reviewed and stable  Post vital signs: Reviewed and stable  Last Vitals:  Filed Vitals:   04/29/16 0938  BP: 183/94  Temp: 36.5 C  Resp: 25    Last Pain: There were no vitals filed for this visit.       Complications: No apparent anesthesia complications

## 2016-04-29 NOTE — OR Nursing (Signed)
Left arm has large abrasion with clear tegaderm covering.  Area around the wound is red and swollen with a creamy white drainage.  Dr. Debara Pickett aware.  Pt advised to to PCP today to have debridement and antibiotic

## 2016-04-29 NOTE — Anesthesia Procedure Notes (Signed)
Procedure Name: MAC Date/Time: 04/29/2016 11:17 AM Performed by: Lance Coon Pre-anesthesia Checklist: Patient identified, Emergency Drugs available, Suction available, Patient being monitored and Timeout performed Patient Re-evaluated:Patient Re-evaluated prior to inductionOxygen Delivery Method: Ambu bag Preoxygenation: Pre-oxygenation with 100% oxygen Intubation Type: IV induction

## 2016-04-29 NOTE — CV Procedure (Signed)
    CARDIOVERSION NOTE  Procedure: Electrical Cardioversion Indications:  Atrial Fibrillation  Procedure Details:  Consent: Risks of procedure as well as the alternatives and risks of each were explained to the (patient/caregiver).  Consent for procedure obtained.  Time Out: Verified patient identification, verified procedure, site/side was marked, verified correct patient position, special equipment/implants available, medications/allergies/relevent history reviewed, required imaging and test results available.  Performed  Patient placed on cardiac monitor, pulse oximetry, supplemental oxygen as necessary.  Sedation given: Propofol per anesthesia Pacer pads placed anterior and posterior chest.  Cardioverted 3 time(s).  Cardioverted at 150J, 200J, and 200J biphasic.  Impression: Findings: Post procedure EKG shows: Atrial Fibrillation Complications: None Patient did tolerate procedure well.  Plan: 1. Unsuccessful DCCV - will likely need anti-arrhythmic therapy given LA size. 2. Large 3 cm full-thickness abrasion of the left triceps with surrounding erythema, warmth and wound discharge. D/w Dr. Laurann Montana (PCP) on the phone and he will see the patient this afternoon at 3:30 pm in the office to evaluate for possible need of antibiotic therapy.  Time Spent Directly with the Patient:  30 minutes   Pixie Casino, MD, [facility] Attending Cardiologist [place] 04/29/2016, 11:37 AM

## 2016-04-29 NOTE — H&P (Signed)
     INTERVAL PROCEDURE H&P  History and Physical Interval Note:  04/29/2016 9:44 AM  [name] has presented today for their planned procedure. The various methods of treatment have been discussed with the patient and family. After consideration of risks, benefits and other options for treatment, the patient has consented to the procedure.  The patients' outpatient history has been reviewed, patient examined, and no change in status from most recent office note within the past 30 days. I have reviewed the patients' chart and labs and will proceed as planned. Questions were answered to the patient's satisfaction.   Pixie Casino, MD, [facility] Attending Cardiologist [place]sao 04/29/2016, 9:44 AM

## 2016-04-29 NOTE — Anesthesia Postprocedure Evaluation (Signed)
Anesthesia Post Note  Patient: [name]  Procedure(s) Performed: Procedure(s) (LRB): CARDIOVERSION (N/A)  Patient location during evaluation: Endoscopy Anesthesia Type: General Level of consciousness: sedated and patient cooperative Pain management: pain level controlled Vital Signs Assessment: post-procedure vital signs reviewed and stable Respiratory status: spontaneous breathing Cardiovascular status: stable Anesthetic complications: no    Last Vitals:  Filed Vitals:   04/29/16 1200 04/29/16 1210  BP: 137/73 167/65  Pulse: 75 76  Temp:    Resp: 27 22    Last Pain: There were no vitals filed for this visit.               Nolon Nations

## 2016-05-04 NOTE — Progress Notes (Signed)
Electrophysiology Office Note Date: 05/05/2016  ID:  [name], DOB 05-02-1934, MRN [id number]  PCP: Irven Shelling, MD Primary Cardiologist: Irish Lack Electrophysiologist: Dayani Winbush  CC: follow up post cardioversion  [name] is a 82 y.o. male seen today after attempted cardioversion that was unsuccessful.  Recent remote transmission showed increased AF burden and we attempted cardioversion last week without success.  He presents today to discuss options.  He has noticed persistent increased shortness of breath and fatigue associated with recurrent AF.  He has not had fevers or chills.   He denies chest pain, palpitations, PND, orthopnea, dizziness, syncope, edema, weight gain, or early satiety.  He has not had ICD shocks.    Device History: MDT CRTD implanted 2009 for ICM, CHF History of appropriate therapy: No History of AAD therapy: yes - amiodarone for AF    Past Medical History  Diagnosis Date  . Dyspnea   . OSA (obstructive sleep apnea)   . HTN (hypertension)   . DM (diabetes mellitus), type 2 ([place])   . Hyperlipidemia   . Systolic congestive heart failure ([place]) 2009    s/p BiV ICD implantation by Dr Leonia Reeves (MDT)  . Atrial fibrillation ([place])     persistent, previously seen at [facility] and placed on amiodarone  . Hypothyroidism   . PNA (pneumonia)   . Cleft palate   . Nasal septal deviation   . Iron deficiency anemia   . GERD (gastroesophageal reflux disease)   . Benign prostatic hypertrophy   . Nephrolithiasis   . Psoriasis   . Seborrheic keratosis   . COPD with emphysema ([place]) 04/01/2010  . CAD (coronary artery disease)     multivessel s/p inferolateral wall MI with subsequent CABG 11/1998.  Cath 2009 with Patent grafts  . Ischemic dilated cardiomyopathy     EF 35-40% by MUGA 6/11  . PAF (paroxysmal atrial fibrillation) ([place])   . Peripheral arterial disease (HCC)     left subclavian artery stenosis  . Stroke ([place])   . Myocardial infarction  [facility])    Past Surgical History  Procedure Laterality Date  . Carpal tunnel release    . Left cleft palate and left cleft lip repair    . Cataract extraction    . C-spine surgery    . Bi-ventricular implantable cardioverter defibrillator  (crt-d)  10-08-08; 11-06-2013    Dr Leonia Reeves (MDT) implant for primary prevention; gen change to MDT VivaXT CRTD by Dr Rayann Heman  . Coronary artery bypass graft      LIMA to LAD, SVG to OM, SVG to diagonal  . Biv icd genertaor change out N/A 11/06/2013    Procedure: BIV ICD [place];  Surgeon: Coralyn Mark, MD;  Location: [facility] CATH LAB;  Service: Cardiovascular;  Laterality: N/A;  . Cardioversion N/A 04/29/2016    Procedure: CARDIOVERSION;  Surgeon: Pixie Casino, MD;  Location: [facility] ENDOSCOPY;  Service: Cardiovascular;  Laterality: N/A;    Current Outpatient Prescriptions  Medication Sig Dispense Refill  . albuterol (PROVENTIL HFA;VENTOLIN HFA) 108 (90 BASE) MCG/ACT inhaler Inhale 2 puffs into the lungs every 6 (six) hours as needed for wheezing or shortness of breath. Must keep June 2016 appt. 1 Inhaler 0  . albuterol (PROVENTIL) (2.5 MG/3ML) 0.083% nebulizer solution USE 1 VIAL VIA NEBULIZER 4 TIMES A DAY AS DIRECTED 300 mL 2  . amiodarone (PACERONE) 200 MG tablet Take 2 tabs (400 mg) by mouth twice daily for 1 week, then 2 tabs (400 mg) once  daily for 2 weeks, then 1 tab (200 mg) once daily thereafter 65 tablet 1  . aspirin 81 MG tablet Take 81 mg by mouth daily.      [name] atorvastatin (LIPITOR) 80 MG tablet TAKE 1 TABLET (80 MG TOTAL) BY MOUTH DAILY. 90 tablet 2  . carvedilol (COREG) 6.25 MG tablet TAKE 1 TABLET BY MOUTH TWICE A DAY 180 tablet 2  . cephALEXin (KEFLEX) 500 MG capsule Take 1 capsule by mouth 2 (two) times daily.  0  . DULERA 100-5 MCG/ACT AERO TAKE 2 PUFFS BY MOUTH TWICE A DAY 1 Inhaler 6  . furosemide (LASIX) 40 MG tablet Take 40 mg by mouth 2 (two) times daily.    [name] HYDROcodone-acetaminophen (NORCO/VICODIN) 5-325 MG per tablet Take  1 tablet by mouth daily as needed for moderate pain.     [name] insulin aspart (NOVOLOG) 100 UNIT/ML injection Inject 23 units into the skin before breakfast and lunch, inject 27units into the skin before supper    . levothyroxine (SYNTHROID, LEVOTHROID) 100 MCG tablet Take 100 mcg by mouth daily before breakfast.     . Multiple Vitamin (MULTIVITAMIN) tablet Take 1 tablet by mouth daily.      [name] omeprazole (PRILOSEC) 20 MG capsule Take 20 mg by mouth daily.      [name] senna-docusate (SENOKOT-S) 8.6-50 MG tablet Take 2 tablets by mouth at bedtime. 30 tablet 0  . spironolactone (ALDACTONE) 25 MG tablet TAKE 1/2 TABLET (12.5 MG TOTAL) BY MOUTH DAILY. 45 tablet 10  . Tiotropium Bromide Monohydrate (SPIRIVA RESPIMAT) 2.5 MCG/ACT AERS Inhale 2 puffs into the lungs daily. 1 Inhaler 5  . traMADol (ULTRAM) 50 MG tablet Take 1 tablet by mouth daily as needed (pain).     [name] FLEXTOUCH 100 UNIT/ML SOPN Inject 70 Units into the skin daily. MORNINGS  3  . vitamin B-12 (CYANOCOBALAMIN) 1000 MCG tablet Take 1,000 mcg by mouth daily.    [name] warfarin (COUMADIN) 5 MG tablet Take 5mg  everyday except on Tuesday and Thursdays take 7.5mg  Please hold coumadin on 3/25, restart coumadin on 3/26, check INR on Monday 3/27. 40 tablet 3  . warfarin (COUMADIN) 5 MG tablet TAKE AS DIRECTED BY COUMADIN CLINIC 40 tablet 3  . ZETIA 10 MG tablet TAKE 1 TABLET BY MOUTH EVERY DAY 90 tablet 1   No current facility-administered medications for this visit.    Allergies:   Review of patient's allergies indicates no known allergies.   Social History: Social History   Social History  . Marital Status: Married    Spouse Name: N/A  . Number of Children: N/A  . Years of Education: N/A   Occupational History  . retired     Engineer, agricultural   Social History Main Topics  . Smoking status: Former Smoker -- 3.00 packs/day for 65 years    Types: Cigarettes    Quit date: 10/25/1998  . Smokeless tobacco: Never Used  . Alcohol Use: No      Comment: remote history of heavy alcohol use  . Drug Use: No  . Sexual Activity: Not on file   Other Topics Concern  . Not on file   Social History Narrative   Lives [place]   Retired    Family History: Family History  Problem Relation Age of Onset  . Asthma Father   . Stroke Father   . Heart disease Brother   . Heart attack Brother   . Hypertension Mother   . Hypertension Father   . Hypertension  Sister   . Hypertension Brother     Review of Systems: All other systems reviewed and are otherwise negative except as noted above.   Physical Exam: VS:  BP 118/68 mmHg  Pulse 87  Ht 5\' 8"  (1.727 m)  Wt 257 lb (116.574 kg)  BMI 39.09 kg/m2 , BMI Body mass index is 39.09 kg/(m^2).  GEN- The patient is obese appearing, alert and oriented x 3 today.   HEENT: normocephalic, atraumatic; sclera clear, conjunctiva pink; hearing intact; oropharynx clear; neck supple  Lungs- Clear to ausculation bilaterally, normal work of breathing.  No wheezes, rales, rhonchi Heart- Regular rate and rhythm (paced) GI- soft, non-tender, non-distended, bowel sounds present  Extremities- no clubbing, cyanosis, or edema; DP/PT/radial pulses 2+ bilaterally MS- no significant deformity or atrophy Skin- warm and dry, no rash or lesion; ICD pocket well healed Psych- euthymic mood, full affect Neuro- strength and sensation are intact  ICD interrogation- reviewed in detail today,  See PACEART report  EKG:  EKG is ordered today. The ekg ordered today shows atrial fibrillation with CRT pacing   Recent Labs: 03/12/2016: Brain Natriuretic Peptide 262.8* 04/21/2016: ALT 16; BUN 22; Creat 1.52*; Hemoglobin 10.9*; Platelets 295; Potassium 4.5; Sodium 138; TSH 2.14   Wt Readings from Last 3 Encounters:  05/05/16 257 lb (116.574 kg)  04/29/16 253 lb (114.76 kg)  04/21/16 257 lb 12.8 oz (116.937 kg)     Other studies Reviewed: Additional studies/ records that were reviewed today include: Dr Irish Lack and  Dr Juanetta Gosling office notes  Assessment and Plan:  1.  Persistent atrial fibrillation Recurrent and identified on recent remote transmission.  Failed recent DCCV despite 1 week of increased amiodarone. Pt remains symptomatic with fatigue and increased shortness of breath He has decreased CRT pacing 2/2 AF Continue Warfarin for CHADS2VASC of 8  Attempted to pace terminate today without success Treatment options discussed with patient including attempted repeat cardioversion, AVN ablation or PVI.   Therapeutic strategies for afib including medicine and ablation were discussed in detail with the patient today. Risk, benefits, and alternatives to EP study and radiofrequency ablation for afib were also discussed in detail today. These risks include but are not limited to lead dislodgement, stroke, bleeding, vascular damage, tamponade, perforation, damage to the esophagus, lungs, and other structures, pulmonary vein stenosis, worsening renal function, and death. The patient understands these risk and wishes to proceed.  We will therefore proceed with catheter ablation at the next available time. Weekly INR's until PVI.  Cardiac CT week of procedure   2. Chronic systolic dysfunction Slightly volume overloaded on exam  Normal ICD function See Pace Art report No changes today  3.  OSA CPAP compliance encouraged  4.  CAD No recent ischemic symptoms Will defer need for concomitant ASA to Dr Irish Lack  5.  HTN Stable No change required today  6.  Obesity Weight loss encouraged  Current medicines are reviewed at length with the patient today.   The patient does not have concerns regarding his medicines.  The following changes were made today:  none  Labs/ tests ordered today include: none   Disposition:   Follow up after AF ablation   Signed, Thompson Grayer, MD   05/05/2016 3:10 PM  [facility] [address] [place] [address] [phone]  (office) [phone] (fax)

## 2016-05-05 ENCOUNTER — Encounter (INDEPENDENT_AMBULATORY_CARE_PROVIDER_SITE_OTHER): Payer: Self-pay

## 2016-05-05 ENCOUNTER — Ambulatory Visit (INDEPENDENT_AMBULATORY_CARE_PROVIDER_SITE_OTHER): Payer: Medicare Other | Admitting: *Deleted

## 2016-05-05 ENCOUNTER — Ambulatory Visit (INDEPENDENT_AMBULATORY_CARE_PROVIDER_SITE_OTHER): Payer: Medicare Other | Admitting: Internal Medicine

## 2016-05-05 ENCOUNTER — Encounter: Payer: Self-pay | Admitting: *Deleted

## 2016-05-05 ENCOUNTER — Encounter: Payer: Self-pay | Admitting: Nurse Practitioner

## 2016-05-05 VITALS — BP 118/68 | HR 87 | Ht 68.0 in | Wt 257.0 lb

## 2016-05-05 DIAGNOSIS — I4819 Other persistent atrial fibrillation: Secondary | ICD-10-CM

## 2016-05-05 DIAGNOSIS — G4733 Obstructive sleep apnea (adult) (pediatric): Secondary | ICD-10-CM | POA: Diagnosis not present

## 2016-05-05 DIAGNOSIS — I481 Persistent atrial fibrillation: Secondary | ICD-10-CM | POA: Diagnosis not present

## 2016-05-05 DIAGNOSIS — Z7901 Long term (current) use of anticoagulants: Secondary | ICD-10-CM | POA: Diagnosis not present

## 2016-05-05 DIAGNOSIS — I255 Ischemic cardiomyopathy: Secondary | ICD-10-CM

## 2016-05-05 DIAGNOSIS — I5022 Chronic systolic (congestive) heart failure: Secondary | ICD-10-CM

## 2016-05-05 DIAGNOSIS — I480 Paroxysmal atrial fibrillation: Secondary | ICD-10-CM

## 2016-05-05 DIAGNOSIS — I4891 Unspecified atrial fibrillation: Secondary | ICD-10-CM

## 2016-05-05 DIAGNOSIS — I420 Dilated cardiomyopathy: Secondary | ICD-10-CM

## 2016-05-05 LAB — POCT INR: INR: 3

## 2016-05-05 MED ORDER — AMIODARONE HCL 400 MG PO TABS: 400.0000 mg | ORAL_TABLET | Freq: Every day | ORAL | Status: DC

## 2016-05-05 NOTE — Patient Instructions (Addendum)
Medication Instructions:   START TAKING AMIODARONE 400 MG ONCE A DAY   If you need a refill on your cardiac medications before your next appointment, please call your pharmacy.  Labwork: RETURN ON 06/07/2016 FOR CBC BMET AND PT/IINR   Testing/Procedures: SEE SPECIAL UNDER  INSTRUCTIONS   EARLIER THE WEEK OF 06/11/2016.Marland KitchenYour physician has requested that you have cardiac CT. Cardiac computed tomography (CT) is a painless test that uses an x-ray machine to take clear, detailed pictures of your heart. For further information please visit [email or web address]. Please follow instruction sheet as given.   Follow-Up:  4 WEEKS AFTER 06/11/2016.[name] WITH AFIB CLNIC WITH DONNA    Any Other Special Instructions Will Be Listed Below (If Applicable).                                              Ablation Procedure Instructions   You are scheduled for a(n) AFIB ABLATION  on  06/11/2016 at   7:30 AM   with Dr. Rayann Heman.  1.   Please come to the [place] at [facility] at 6 AM  on the day of   your      procedure.  1. Come prepared to stay overnight.  Please bring your insurance cards and a list of your medications.  3.   Come to the [place] office on 06/07/2016.. for lab work.  The lab at [facility] is open from 8:30 AM to 1:30 PM and 2:30 PM to 5:00 PM.    4. Do not have anything to eat or drink after midnight the night before your procedure.  5.  Do NOT take these medications for DIABETIC AND FLUID PILLS MORNING OF your procedure      unless otherwise instructed:  All of your remaining medications may be taken with a small       amount of water.

## 2016-05-06 ENCOUNTER — Encounter: Payer: Medicare Other | Admitting: Nurse Practitioner

## 2016-05-06 DIAGNOSIS — L089 Local infection of the skin and subcutaneous tissue, unspecified: Secondary | ICD-10-CM | POA: Diagnosis not present

## 2016-05-06 LAB — CUP PACEART INCLINIC DEVICE CHECK
Date Time Interrogation Session: 20170713075250
Implantable Lead Implant Date: 20091215
Implantable Lead Implant Date: 20091215
Implantable Lead Implant Date: 20091215
Implantable Lead Location: 753858
Implantable Lead Location: 753859
Implantable Lead Location: 753860
Implantable Lead Model: 4196
Implantable Lead Model: 5076
Implantable Lead Model: 6947

## 2016-05-07 ENCOUNTER — Encounter: Payer: Self-pay | Admitting: Internal Medicine

## 2016-05-12 ENCOUNTER — Ambulatory Visit (INDEPENDENT_AMBULATORY_CARE_PROVIDER_SITE_OTHER): Payer: Medicare Other | Admitting: *Deleted

## 2016-05-12 ENCOUNTER — Encounter (INDEPENDENT_AMBULATORY_CARE_PROVIDER_SITE_OTHER): Payer: Self-pay

## 2016-05-12 DIAGNOSIS — Z7901 Long term (current) use of anticoagulants: Secondary | ICD-10-CM | POA: Diagnosis not present

## 2016-05-12 DIAGNOSIS — I480 Paroxysmal atrial fibrillation: Secondary | ICD-10-CM | POA: Diagnosis not present

## 2016-05-12 DIAGNOSIS — I4891 Unspecified atrial fibrillation: Secondary | ICD-10-CM | POA: Diagnosis not present

## 2016-05-12 LAB — POCT INR: INR: 4

## 2016-05-17 ENCOUNTER — Ambulatory Visit (INDEPENDENT_AMBULATORY_CARE_PROVIDER_SITE_OTHER): Payer: Medicare Other

## 2016-05-17 ENCOUNTER — Telehealth: Payer: Self-pay

## 2016-05-17 DIAGNOSIS — I5022 Chronic systolic (congestive) heart failure: Secondary | ICD-10-CM | POA: Diagnosis not present

## 2016-05-17 DIAGNOSIS — Z95810 Presence of automatic (implantable) cardiac defibrillator: Secondary | ICD-10-CM

## 2016-05-17 NOTE — Telephone Encounter (Signed)
Remote ICM transmission received.  Attempted patient call and left message for return call.   

## 2016-05-17 NOTE — Progress Notes (Signed)
EPIC Encounter for ICM Monitoring  Patient Name: [name] is a 82 y.o. male Date: 05/17/2016 Primary Care Physican: Irven Shelling, MD Primary Cardiologist: Irish Lack Electrophysiologist: Allred Dry Weight: 250 lb  Bi-V Pacing:  78.9%       Heart Failure questions reviewed, pt symptomatic with slightly swollen feet.  Patient reported he has has eaten salt in the last few days.   Thoracic impedance decreased suggesting fluid accumulation since 05/07/2016 and returning to baseline 05/17/2016.  Recommendations:  Reviewed transmission with Dr Irish Lack in office.  He did not recommend any changes today.  Patient will be checked in office by Tommye Standard, NP on 05/24/2016.   ICM trend: 05/17/2016    Follow-up plan: ICM clinic phone appointment on 06/17/2016.  Office appointment with Tommye Standard, PA on 05/24/2016.  Scheduled for ablation on 06/11/2016.    Copy of ICM check sent to device physician.   Rosalene Billings, RN 05/17/2016 9:41 AM

## 2016-05-19 ENCOUNTER — Ambulatory Visit (INDEPENDENT_AMBULATORY_CARE_PROVIDER_SITE_OTHER): Payer: Medicare Other | Admitting: *Deleted

## 2016-05-19 DIAGNOSIS — Z7901 Long term (current) use of anticoagulants: Secondary | ICD-10-CM

## 2016-05-19 DIAGNOSIS — I480 Paroxysmal atrial fibrillation: Secondary | ICD-10-CM | POA: Diagnosis not present

## 2016-05-19 DIAGNOSIS — I4891 Unspecified atrial fibrillation: Secondary | ICD-10-CM

## 2016-05-19 LAB — POCT INR: INR: 2.5

## 2016-05-21 ENCOUNTER — Telehealth: Payer: Self-pay | Admitting: Interventional Cardiology

## 2016-05-21 NOTE — Telephone Encounter (Signed)
Spoke with patient's wife and clarified lab appointment for 8/10

## 2016-05-21 NOTE — Telephone Encounter (Signed)
New message  Pt call requesting to speak to RN. Pt wife has some concerns with pt going to the hospital on the 8/10 and also coming to get lab work on same day. Pt wife wants to know if pt would have to keep the lab work appt. Please call back to discuss

## 2016-05-23 NOTE — Progress Notes (Addendum)
Cardiology Office Note Date:  05/24/2016  Patient ID:  [name], [name] 05/18/1934, MRN [id number] PCP:  Irven Shelling, MD  Cardiologist:  [name] Electrophysiologist: Dr. Rayann Heman   Chief Complaint: scheduled visit  History of Present Illness: [name] is a 82 y.o. male with history of persistent AFib, COPD on CPAP, left subclavian stenosis, CHF, s/p biventricular defibrillator, CAD s/p CABG, hypothyroidism, HTN, CVA.  Appears to have CRI, stage III.  Admitted 01/16/16 with acute resp. Failure with hypoxia acute COPD exacerbation RLL PNA, and acute systolic HF.  He comes in today to be seen for Dr. Rayann Heman, he is feeling fairly well, fatigued with the AF, otherwise doing OK, denies any kind of CP, or SOB, no dizziness, near syncope or syncope, no shocks from his device.  No bleeding or signs of bleeding on the warfarin.  There is a note mentioning the patient heard beeping from his device, though this ended up being a nearby phone.  He is scheduled for AF ablation with Dr. Rayann Heman, 06/11/16, he has no follow up questions regarding the procedure and wished to proceed.   Device history: patient denies any history of shocks  AFib history: 04/29/16 failed DCCV Amiodarone Scheduled for PVI ablation 06/11/16, Dr. Rayann Heman   Past Medical History:  Diagnosis Date  . Atrial fibrillation ([place])    persistent, previously seen at [facility] and placed on amiodarone  . Benign prostatic hypertrophy   . CAD (coronary artery disease)    multivessel s/p inferolateral wall MI with subsequent CABG 11/1998.  Cath 2009 with Patent grafts  . Cleft palate   . COPD with emphysema ([place]) 04/01/2010  . DM (diabetes mellitus), type 2 ([place])   . Dyspnea   . GERD (gastroesophageal reflux disease)   . HTN (hypertension)   . Hyperlipidemia   . Hypothyroidism   . Iron deficiency anemia   . Ischemic dilated cardiomyopathy    EF 35-40% by MUGA 6/11  . Myocardial infarction ([place])   . Nasal septal  deviation   . Nephrolithiasis   . OSA (obstructive sleep apnea)   . PAF (paroxysmal atrial fibrillation) ([place])   . Peripheral arterial disease (HCC)    left subclavian artery stenosis  . PNA (pneumonia)   . Psoriasis   . Seborrheic keratosis   . Stroke ([place])   . Systolic congestive heart failure ([place]) 2009   s/p BiV ICD implantation by Dr Leonia Reeves (MDT)    Past Surgical History:  Procedure Laterality Date  . BI-VENTRICULAR IMPLANTABLE CARDIOVERTER DEFIBRILLATOR  (CRT-D)  10-08-08; 11-06-2013   Dr Leonia Reeves (MDT) implant for primary prevention; gen change to MDT VivaXT CRTD by Dr Rayann Heman  . BIV ICD GENERTAOR CHANGE OUT N/A 11/06/2013   Procedure: BIV ICD GENERTAOR CHANGE OUT;  Surgeon: Coralyn Mark, MD;  Location: [facility] CATH LAB;  Service: Cardiovascular;  Laterality: N/A;  . c-spine surgery    . CARDIOVERSION N/A 04/29/2016   Procedure: CARDIOVERSION;  Surgeon: Pixie Casino, MD;  Location: [facility] ENDOSCOPY;  Service: Cardiovascular;  Laterality: N/A;  . CARPAL TUNNEL RELEASE    . CATARACT EXTRACTION    . CORONARY ARTERY BYPASS GRAFT     LIMA to LAD, SVG to OM, SVG to diagonal  . left cleft palate and left cleft lip repair      Current Outpatient Prescriptions  Medication Sig Dispense Refill  . albuterol (PROVENTIL HFA;VENTOLIN HFA) 108 (90 BASE) MCG/ACT inhaler Inhale 2 puffs into the lungs every 6 (six) hours as needed for  wheezing or shortness of breath. Must keep June 2016 appt. 1 Inhaler 0  . albuterol (PROVENTIL) (2.5 MG/3ML) 0.083% nebulizer solution USE 1 VIAL VIA NEBULIZER 4 TIMES A DAY AS DIRECTED 300 mL 2  . amiodarone (PACERONE) 400 MG tablet Take 1 tablet (400 mg total) by mouth daily. 30 tablet 6  . aspirin 81 MG tablet Take 81 mg by mouth daily.      [name] atorvastatin (LIPITOR) 80 MG tablet TAKE 1 TABLET (80 MG TOTAL) BY MOUTH DAILY. 90 tablet 2  . carvedilol (COREG) 6.25 MG tablet TAKE 1 TABLET BY MOUTH TWICE A DAY 180 tablet 2  . DULERA 100-5 MCG/ACT AERO TAKE 2 PUFFS BY  MOUTH TWICE A DAY 1 Inhaler 6  . furosemide (LASIX) 40 MG tablet Take 40 mg by mouth 2 (two) times daily.    [name] HYDROcodone-acetaminophen (NORCO/VICODIN) 5-325 MG per tablet Take 1 tablet by mouth daily as needed for moderate pain.     [name] insulin aspart (NOVOLOG) 100 UNIT/ML injection Inject 23 units into the skin before breakfast and lunch, inject 27units into the skin before supper    . levothyroxine (SYNTHROID, LEVOTHROID) 100 MCG tablet Take 100 mcg by mouth daily before breakfast.     . Multiple Vitamin (MULTIVITAMIN) tablet Take 1 tablet by mouth daily.      [name] omeprazole (PRILOSEC) 20 MG capsule Take 20 mg by mouth daily.      [name] senna-docusate (SENOKOT-S) 8.6-50 MG tablet Take 2 tablets by mouth at bedtime. 30 tablet 0  . spironolactone (ALDACTONE) 25 MG tablet TAKE 1/2 TABLET (12.5 MG TOTAL) BY MOUTH DAILY. 45 tablet 10  . Tiotropium Bromide Monohydrate (SPIRIVA RESPIMAT) 2.5 MCG/ACT AERS Inhale 2 puffs into the lungs daily. 1 Inhaler 5  . traMADol (ULTRAM) 50 MG tablet Take 1 tablet by mouth daily as needed (pain).     [name] FLEXTOUCH 100 UNIT/ML SOPN Inject 70 Units into the skin daily. MORNINGS  3  . vitamin B-12 (CYANOCOBALAMIN) 1000 MCG tablet Take 1,000 mcg by mouth daily.    [name] warfarin (COUMADIN) 5 MG tablet Take 5mg  everyday except on Tuesday and Thursdays take 7.5mg  Please hold coumadin on 3/25, restart coumadin on 3/26, check INR on Monday 3/27. 40 tablet 3  . warfarin (COUMADIN) 5 MG tablet TAKE AS DIRECTED BY COUMADIN CLINIC 40 tablet 3   No current facility-administered medications for this visit.     Allergies:   Review of patient's allergies indicates no known allergies.   Social History:  The patient  reports that he quit smoking about 17 years ago. His smoking use included Cigarettes. He has a 195.00 pack-year smoking history. He has never used smokeless tobacco. He reports that he does not drink alcohol or use drugs.   Family History:  The patient's family history  includes Asthma in his father; Heart attack in his brother; Heart disease in his brother; Hypertension in his brother, father, mother, and sister; Stroke in his father.  ROS:  Please see the history of present illness.  All other systems are reviewed and otherwise negative.   PHYSICAL EXAM:  VS:  BP 116/64 (BP Location: Right Arm, Patient Position: Sitting, Cuff Size: Normal)   Pulse 85   Ht 5\' 8"  (1.727 m)   Wt 257 lb (116.6 kg)   BMI 39.08 kg/m  BMI: Body mass index is 39.08 kg/m. Well nourished, well developed, obese, in no acute distress  HEENT: normocephalic, atraumatic  Neck: no JVD, carotid bruits or masses  Cardiac:  (paced) RRR; no significant murmurs, no rubs, or gallops Lungs:  clear to auscultation bilaterally, no wheezing, rhonchi or rales  Abd: soft, nontender MS: no deformity or atrophy Ext:  1+ pitting edema b/l (reported to be about his baseline) Skin: warm and dry, no rash Neuro:  No gross deficits appreciated Psych: euthymic mood, full affect  ICD site is stable, no tethering or discomfort   EKG:  Done today shows V paced rhythm Device interrogation today with normal function, AFib, no arrhythmias otherwise only 80% BiVe pacing   01/11/16: Echocardiogram Study Conclusions - Left ventricle: The cavity size was normal. Wall thickness was   increased in a pattern of mild LVH. Systolic function was   moderately reduced. The estimated ejection fraction was in the   range of 35% to 40%. Anterior and inferolateral hypokinesis   noted. Features are consistent with a pseudonormal left   ventricular filling pattern, with concomitant abnormal relaxation   and increased filling pressure (grade 2 diastolic dysfunction). - Aortic valve: There was no stenosis. - Mitral valve: There was no significant regurgitation. - Left atrium: The atrium was moderately to severely dilated. 66mm - Right ventricle: Poorly visualized. The cavity size was normal.   Pacer wire or catheter  noted in right ventricle. Systolic   function was normal. - Pulmonary arteries: PA peak pressure: 23 mm Hg (S). - Inferior vena cava: The vessel was normal in size. The   respirophasic diameter changes were in the normal range (>= 50%),   consistent with normal central venous pressure.   Recent Labs: 03/12/2016: Brain Natriuretic Peptide 262.8 04/21/2016: ALT 16; BUN 22; Creat 1.52; Hemoglobin 10.9; Platelets 295; Potassium 4.5; Sodium 138; TSH 2.14  No results found for requested labs within last 8760 hours.   CrCl cannot be calculated (Patient's most recent lab result is older than the maximum 21 days allowed.).   Wt Readings from Last 3 Encounters:  05/24/16 257 lb (116.6 kg)  05/05/16 257 lb (116.6 kg)  04/29/16 253 lb (114.8 kg)     Other studies reviewed: Additional studies/records reviewed today include: summarized above  DEVICE information: MDT CRT-D, primary prevention, implanted 10/08/08, gen change 11/06/13, Dr. Rayann Heman No history of shocks reported  ASSESSMENT AND PLAN:  1.  Persistent atrial fibrillation Pt remains symptomatic with fatigue and increased shortness of breath  He has decreased CRT pacing 2/2 AF Continue Warfarin for CHADS2VASC of 8  Failed recent DCCV despite 1 week of increased amiodarone Dr. Rayann Heman attempted to pace terminate 05/04/16 without success Dr. Rayann Heman discussed teatment options discussed with patient including attempted repeat cardioversion, AVN ablation or PVI at is visit earlier this month and was decided to proceed with PVI ablation at the next available time. weekly INR's have been therapeutic, continue until his scheduled ablation on 06/11/16  Not on ACE/ARB currently, his renal function has waxed/waned, stage II area, will hold off for now  2. Chronic systolic dysfunction, ICM, CRT-D      Some edema is appreciated, no rest SOB, no symptoms of PND/orthopnea     Optivol looks OK       Normal ICD function     No changes today  3.   OSA      CPAP compliance encouraged  4.  CAD      No recent ischemic symptoms      Will defer need for concomitant ASA to Dr Irish Lack  5.  HTN        Stable  No change required today  6.  Obesity       Weight loss encouraged  Disposition: Continue weekly INR's up to his procedure, f/u with pre-procedure labs 06/03/16 and CT scan 06/04/16 as scheduled, his planned AF ablation 06/11/16, and f/u with AF clinic and Dr. Rayann Heman post procedure  Current medicines are reviewed at length with the patient today.  The patient did not have any concerns regarding medicines.  Haywood Lasso, PA-C 05/24/2016 4:33 PM     [facility] [place] [address] [phone] (office)  [phone] (fax)

## 2016-05-24 ENCOUNTER — Ambulatory Visit (INDEPENDENT_AMBULATORY_CARE_PROVIDER_SITE_OTHER): Payer: Medicare Other | Admitting: Physician Assistant

## 2016-05-24 ENCOUNTER — Encounter: Payer: Self-pay | Admitting: Physician Assistant

## 2016-05-24 ENCOUNTER — Encounter: Payer: Self-pay | Admitting: Internal Medicine

## 2016-05-24 VITALS — BP 116/64 | HR 85 | Ht 68.0 in | Wt 257.0 lb

## 2016-05-24 DIAGNOSIS — I5022 Chronic systolic (congestive) heart failure: Secondary | ICD-10-CM | POA: Diagnosis not present

## 2016-05-24 DIAGNOSIS — Z01810 Encounter for preprocedural cardiovascular examination: Secondary | ICD-10-CM | POA: Diagnosis not present

## 2016-05-24 DIAGNOSIS — I255 Ischemic cardiomyopathy: Secondary | ICD-10-CM

## 2016-05-24 DIAGNOSIS — I10 Essential (primary) hypertension: Secondary | ICD-10-CM

## 2016-05-24 DIAGNOSIS — I481 Persistent atrial fibrillation: Secondary | ICD-10-CM

## 2016-05-24 DIAGNOSIS — I4819 Other persistent atrial fibrillation: Secondary | ICD-10-CM

## 2016-05-24 NOTE — Patient Instructions (Addendum)
Medication Instructions:   Your physician recommends that you continue on your current medications as directed. Please refer to the Current Medication list given to you today.   If you need a refill on your cardiac medications before your next appointment, please call your pharmacy.  Labwork:  RETURN FOR LAB WORK AS SCHEDULED   Testing/Procedures: NONE ORDER TODAY    Follow-Up: ONE MONTH POST ABLATION  [facility]  DR Rayann Heman  AFTER 06/11/2016  (ALREADY  SCHEDULED)  KEEP OTHER APPT AS SCHEDULED  Any Other Special Instructions Will Be Listed Below (If Applicable).

## 2016-05-26 ENCOUNTER — Ambulatory Visit (INDEPENDENT_AMBULATORY_CARE_PROVIDER_SITE_OTHER): Payer: Medicare Other | Admitting: *Deleted

## 2016-05-26 DIAGNOSIS — I4891 Unspecified atrial fibrillation: Secondary | ICD-10-CM | POA: Diagnosis not present

## 2016-05-26 DIAGNOSIS — I480 Paroxysmal atrial fibrillation: Secondary | ICD-10-CM

## 2016-05-26 DIAGNOSIS — Z7901 Long term (current) use of anticoagulants: Secondary | ICD-10-CM | POA: Diagnosis not present

## 2016-05-26 LAB — POCT INR: INR: 3.9

## 2016-05-28 ENCOUNTER — Encounter: Payer: Self-pay | Admitting: Internal Medicine

## 2016-06-02 ENCOUNTER — Other Ambulatory Visit (INDEPENDENT_AMBULATORY_CARE_PROVIDER_SITE_OTHER): Payer: Medicare Other

## 2016-06-02 ENCOUNTER — Ambulatory Visit (INDEPENDENT_AMBULATORY_CARE_PROVIDER_SITE_OTHER): Payer: Medicare Other | Admitting: *Deleted

## 2016-06-02 DIAGNOSIS — I481 Persistent atrial fibrillation: Secondary | ICD-10-CM

## 2016-06-02 DIAGNOSIS — I4891 Unspecified atrial fibrillation: Secondary | ICD-10-CM | POA: Diagnosis not present

## 2016-06-02 DIAGNOSIS — Z7901 Long term (current) use of anticoagulants: Secondary | ICD-10-CM | POA: Diagnosis not present

## 2016-06-02 DIAGNOSIS — I480 Paroxysmal atrial fibrillation: Secondary | ICD-10-CM | POA: Diagnosis not present

## 2016-06-02 DIAGNOSIS — I4819 Other persistent atrial fibrillation: Secondary | ICD-10-CM

## 2016-06-02 LAB — CBC
HCT: 32.9 % — ABNORMAL LOW (ref 38.5–50.0)
Hemoglobin: 10.4 g/dL — ABNORMAL LOW (ref 13.2–17.1)
MCH: 23 pg — ABNORMAL LOW (ref 27.0–33.0)
MCHC: 31.6 g/dL — ABNORMAL LOW (ref 32.0–36.0)
MCV: 72.8 fL — ABNORMAL LOW (ref 80.0–100.0)
MPV: 8.2 fL (ref 7.5–12.5)
Platelets: 260 10*3/uL (ref 140–400)
RBC: 4.52 MIL/uL (ref 4.20–5.80)
RDW: 17.9 % — ABNORMAL HIGH (ref 11.0–15.0)
WBC: 8.4 10*3/uL (ref 3.8–10.8)

## 2016-06-02 LAB — BASIC METABOLIC PANEL WITH GFR
BUN: 27 mg/dL — ABNORMAL HIGH (ref 7–25)
CO2: 31 mmol/L (ref 20–31)
Calcium: 9.5 mg/dL (ref 8.6–10.3)
Chloride: 100 mmol/L (ref 98–110)
Creat: 1.58 mg/dL — ABNORMAL HIGH (ref 0.70–1.11)
Glucose, Bld: 78 mg/dL (ref 65–99)
Potassium: 5.3 mmol/L (ref 3.5–5.3)
Sodium: 139 mmol/L (ref 135–146)

## 2016-06-02 LAB — POCT INR: INR: 3.2

## 2016-06-02 LAB — PROTIME-INR
INR: 3 — ABNORMAL HIGH
Prothrombin Time: 30.5 s — ABNORMAL HIGH (ref 9.0–11.5)

## 2016-06-03 ENCOUNTER — Other Ambulatory Visit: Payer: Medicare Other

## 2016-06-04 ENCOUNTER — Telehealth: Payer: Self-pay | Admitting: Internal Medicine

## 2016-06-04 ENCOUNTER — Encounter (HOSPITAL_COMMUNITY): Payer: Self-pay

## 2016-06-04 ENCOUNTER — Ambulatory Visit (HOSPITAL_COMMUNITY)
Admission: RE | Admit: 2016-06-04 | Discharge: 2016-06-04 | Disposition: A | Discharge status: Home / Self Care | Payer: Medicare Other | Source: Ambulatory Visit | Attending: Nurse Practitioner | Admitting: Nurse Practitioner

## 2016-06-04 ENCOUNTER — Telehealth: Payer: Self-pay | Admitting: *Deleted

## 2016-06-04 DIAGNOSIS — I700 Atherosclerosis of aorta: Secondary | ICD-10-CM | POA: Insufficient documentation

## 2016-06-04 DIAGNOSIS — I481 Persistent atrial fibrillation: Secondary | ICD-10-CM | POA: Insufficient documentation

## 2016-06-04 DIAGNOSIS — J432 Centrilobular emphysema: Secondary | ICD-10-CM | POA: Insufficient documentation

## 2016-06-04 DIAGNOSIS — I4891 Unspecified atrial fibrillation: Secondary | ICD-10-CM | POA: Diagnosis present

## 2016-06-04 DIAGNOSIS — I4819 Other persistent atrial fibrillation: Secondary | ICD-10-CM

## 2016-06-04 MED ORDER — IOPAMIDOL (ISOVUE-370) INJECTION 76%
INTRAVENOUS | Status: AC
  Administered 2016-06-04: 80 mL
  Filled 2016-06-04: qty 100

## 2016-06-04 MED ORDER — DILTIAZEM LOAD VIA INFUSION
5.0000 mg | Freq: Once | INTRAVENOUS | Status: AC
  Administered 2016-06-04: 5 mg via INTRAVENOUS
  Filled 2016-06-04: qty 5

## 2016-06-04 NOTE — Telephone Encounter (Signed)
MESSAGE  ADDRESSED WITH PT'S  WIFE  ALL QUESTIONS  ANSWERED TO WIFE'S SATISFACTION./CY

## 2016-06-04 NOTE — Addendum Note (Signed)
Addended by: Freada Bergeron on: 06/04/2016 08:53 AM   Modules accepted: Orders

## 2016-06-04 NOTE — Telephone Encounter (Signed)
SPOKE TO PT WIFE ABOUT LAB RESULTS AND COUMADIN WILLBE CONTACTED ABOUT INR RESULTS FOR ADJUSTMENTS FOR THE ABLATION 8/18

## 2016-06-04 NOTE — Progress Notes (Signed)
Dr Meda Coffee aware of heart rate, paced with atrial fib and that patient is audibly wheezy. No Metoprolol or nitro needed for exam as per Dr Meda Coffee. Patient states he is often wheezy.

## 2016-06-04 NOTE — Telephone Encounter (Signed)
Per pt's wife call:   She would like to know wether or not pt can take certain medications today .   Please give her a call back.

## 2016-06-05 ENCOUNTER — Other Ambulatory Visit: Payer: Self-pay | Admitting: Nurse Practitioner

## 2016-06-07 ENCOUNTER — Other Ambulatory Visit: Payer: Medicare Other

## 2016-06-08 ENCOUNTER — Ambulatory Visit (INDEPENDENT_AMBULATORY_CARE_PROVIDER_SITE_OTHER): Payer: Medicare Other | Admitting: *Deleted

## 2016-06-08 DIAGNOSIS — Z7901 Long term (current) use of anticoagulants: Secondary | ICD-10-CM

## 2016-06-08 DIAGNOSIS — I480 Paroxysmal atrial fibrillation: Secondary | ICD-10-CM | POA: Diagnosis not present

## 2016-06-08 DIAGNOSIS — I4891 Unspecified atrial fibrillation: Secondary | ICD-10-CM

## 2016-06-08 LAB — POCT INR: INR: 3.1

## 2016-06-10 NOTE — Anesthesia Preprocedure Evaluation (Addendum)
Anesthesia Evaluation  Patient identified by MRN, date of birth, ID band Patient awake    Reviewed: Allergy & Precautions, NPO status , Patient's Chart, lab work & pertinent test results  Airway Mallampati: III  TM Distance: >3 FB Neck ROM: Full   Comment: S/p cleft repair Dental  (+) Dental Advisory Given, Poor Dentition, Partial Upper, Chipped   Pulmonary sleep apnea and Continuous Positive Airway Pressure Ventilation , COPD,  COPD inhaler, former smoker,    breath sounds clear to auscultation       Cardiovascular hypertension, Pt. on medications and Pt. on home beta blockers (-) angina+ CAD, + Past MI, + CABG and + Peripheral Vascular Disease  + dysrhythmias Atrial Fibrillation + pacemaker + Cardiac Defibrillator (device has never shocked pt)  Rhythm:Irregular Rate:Normal  3/17 ECHO: EF 35-40%, valves OK   Neuro/Psych CVA (R weakness), Residual Symptoms    GI/Hepatic Neg liver ROS, GERD  Controlled and Medicated,  Endo/Other  diabetes (glu 100), Insulin DependentHypothyroidism   Renal/GU Renal InsufficiencyRenal disease (creat 1.58)     Musculoskeletal   Abdominal (+) + obese,   Peds  Hematology  (+) Blood dyscrasia (coumadin), ,   Anesthesia Other Findings   Reproductive/Obstetrics                           Anesthesia Physical Anesthesia Plan  ASA: IV  Anesthesia Plan: General   Post-op Pain Management:    Induction: Intravenous  Airway Management Planned: Oral ETT  Additional Equipment: Arterial line  Intra-op Plan:   Post-operative Plan: Extubation in OR  Informed Consent: I have reviewed the patients History and Physical, chart, labs and discussed the procedure including the risks, benefits and alternatives for the proposed anesthesia with the patient or authorized representative who has indicated his/her understanding and acceptance.   Dental advisory given  Plan  Discussed with: CRNA and Surgeon  Anesthesia Plan Comments: (Plan routine monitors, A-line, GETA)        Anesthesia Quick Evaluation

## 2016-06-11 ENCOUNTER — Ambulatory Visit (HOSPITAL_COMMUNITY): Payer: Medicare Other | Admitting: Certified Registered Nurse Anesthetist

## 2016-06-11 ENCOUNTER — Ambulatory Visit (HOSPITAL_BASED_OUTPATIENT_CLINIC_OR_DEPARTMENT_OTHER): Payer: Medicare Other

## 2016-06-11 ENCOUNTER — Ambulatory Visit (HOSPITAL_COMMUNITY)
Admission: RE | Admit: 2016-06-11 | Discharge: 2016-06-12 | Disposition: A | Discharge status: Home / Self Care | Payer: Medicare Other | Source: Ambulatory Visit | Attending: Internal Medicine | Admitting: Internal Medicine

## 2016-06-11 ENCOUNTER — Encounter (HOSPITAL_COMMUNITY): Payer: Self-pay | Admitting: *Deleted

## 2016-06-11 ENCOUNTER — Ambulatory Visit (HOSPITAL_COMMUNITY): Payer: Medicare Other

## 2016-06-11 ENCOUNTER — Encounter (HOSPITAL_COMMUNITY)
Admission: RE | Disposition: A | Discharge status: Home / Self Care | Payer: Self-pay | Source: Ambulatory Visit | Attending: Internal Medicine

## 2016-06-11 DIAGNOSIS — I130 Hypertensive heart and chronic kidney disease with heart failure and stage 1 through stage 4 chronic kidney disease, or unspecified chronic kidney disease: Secondary | ICD-10-CM | POA: Insufficient documentation

## 2016-06-11 DIAGNOSIS — G4733 Obstructive sleep apnea (adult) (pediatric): Secondary | ICD-10-CM | POA: Diagnosis not present

## 2016-06-11 DIAGNOSIS — J449 Chronic obstructive pulmonary disease, unspecified: Secondary | ICD-10-CM | POA: Insufficient documentation

## 2016-06-11 DIAGNOSIS — Z951 Presence of aortocoronary bypass graft: Secondary | ICD-10-CM | POA: Diagnosis not present

## 2016-06-11 DIAGNOSIS — N183 Chronic kidney disease, stage 3 (moderate): Secondary | ICD-10-CM | POA: Insufficient documentation

## 2016-06-11 DIAGNOSIS — Z87891 Personal history of nicotine dependence: Secondary | ICD-10-CM | POA: Insufficient documentation

## 2016-06-11 DIAGNOSIS — I4891 Unspecified atrial fibrillation: Secondary | ICD-10-CM | POA: Diagnosis present

## 2016-06-11 DIAGNOSIS — Z794 Long term (current) use of insulin: Secondary | ICD-10-CM | POA: Insufficient documentation

## 2016-06-11 DIAGNOSIS — I69351 Hemiplegia and hemiparesis following cerebral infarction affecting right dominant side: Secondary | ICD-10-CM | POA: Insufficient documentation

## 2016-06-11 DIAGNOSIS — I4892 Unspecified atrial flutter: Secondary | ICD-10-CM | POA: Insufficient documentation

## 2016-06-11 DIAGNOSIS — E039 Hypothyroidism, unspecified: Secondary | ICD-10-CM | POA: Diagnosis not present

## 2016-06-11 DIAGNOSIS — Z7901 Long term (current) use of anticoagulants: Secondary | ICD-10-CM | POA: Insufficient documentation

## 2016-06-11 DIAGNOSIS — Z7982 Long term (current) use of aspirin: Secondary | ICD-10-CM | POA: Insufficient documentation

## 2016-06-11 DIAGNOSIS — I252 Old myocardial infarction: Secondary | ICD-10-CM | POA: Diagnosis not present

## 2016-06-11 DIAGNOSIS — I481 Persistent atrial fibrillation: Secondary | ICD-10-CM | POA: Diagnosis not present

## 2016-06-11 DIAGNOSIS — R0602 Shortness of breath: Secondary | ICD-10-CM

## 2016-06-11 DIAGNOSIS — J9610 Chronic respiratory failure, unspecified whether with hypoxia or hypercapnia: Secondary | ICD-10-CM | POA: Insufficient documentation

## 2016-06-11 DIAGNOSIS — E669 Obesity, unspecified: Secondary | ICD-10-CM | POA: Insufficient documentation

## 2016-06-11 DIAGNOSIS — E1122 Type 2 diabetes mellitus with diabetic chronic kidney disease: Secondary | ICD-10-CM | POA: Diagnosis not present

## 2016-06-11 DIAGNOSIS — Z950 Presence of cardiac pacemaker: Secondary | ICD-10-CM | POA: Insufficient documentation

## 2016-06-11 DIAGNOSIS — I5022 Chronic systolic (congestive) heart failure: Secondary | ICD-10-CM | POA: Insufficient documentation

## 2016-06-11 DIAGNOSIS — Z6841 Body Mass Index (BMI) 40.0 and over, adult: Secondary | ICD-10-CM | POA: Insufficient documentation

## 2016-06-11 DIAGNOSIS — N400 Enlarged prostate without lower urinary tract symptoms: Secondary | ICD-10-CM | POA: Insufficient documentation

## 2016-06-11 DIAGNOSIS — I2510 Atherosclerotic heart disease of native coronary artery without angina pectoris: Secondary | ICD-10-CM | POA: Insufficient documentation

## 2016-06-11 DIAGNOSIS — E785 Hyperlipidemia, unspecified: Secondary | ICD-10-CM | POA: Diagnosis not present

## 2016-06-11 DIAGNOSIS — I480 Paroxysmal atrial fibrillation: Secondary | ICD-10-CM | POA: Diagnosis present

## 2016-06-11 DIAGNOSIS — K219 Gastro-esophageal reflux disease without esophagitis: Secondary | ICD-10-CM | POA: Insufficient documentation

## 2016-06-11 HISTORY — PX: ELECTROPHYSIOLOGIC STUDY: SHX172A

## 2016-06-11 LAB — GLUCOSE, CAPILLARY
Glucose-Capillary: 100 mg/dL — ABNORMAL HIGH (ref 65–99)
Glucose-Capillary: 111 mg/dL — ABNORMAL HIGH (ref 65–99)
Glucose-Capillary: 86 mg/dL (ref 65–99)
Glucose-Capillary: 99 mg/dL (ref 65–99)
Glucose-Capillary: 112 mg/dL — ABNORMAL HIGH (ref 65–99)
Glucose-Capillary: 127 mg/dL — ABNORMAL HIGH (ref 65–99)

## 2016-06-11 LAB — POCT ACTIVATED CLOTTING TIME
Activated Clotting Time: 208 seconds
Activated Clotting Time: 230 seconds
Activated Clotting Time: 263 seconds
Activated Clotting Time: 307 seconds
Activated Clotting Time: 230 seconds
Activated Clotting Time: 213 seconds
Activated Clotting Time: 197 seconds
Activated Clotting Time: 180 seconds

## 2016-06-11 LAB — MRSA PCR SCREENING: MRSA by PCR: NEGATIVE

## 2016-06-11 LAB — PROTIME-INR
INR: 2.14
Prothrombin Time: 24.3 seconds — ABNORMAL HIGH (ref 11.4–15.2)

## 2016-06-11 SURGERY — ATRIAL FIBRILLATION ABLATION
Anesthesia: General

## 2016-06-11 MED ORDER — INSULIN ASPART 100 UNIT/ML ~~LOC~~ SOLN
23.0000 [IU] | Freq: Three times a day (TID) | SUBCUTANEOUS | Status: DC
  Administered 2016-06-12 (×2): 23 [IU] via SUBCUTANEOUS

## 2016-06-11 MED ORDER — ALBUTEROL SULFATE HFA 108 (90 BASE) MCG/ACT IN AERS
INHALATION_SPRAY | RESPIRATORY_TRACT | Status: DC | PRN
  Administered 2016-06-11: 2 via RESPIRATORY_TRACT
  Administered 2016-06-11 (×2): 3 via RESPIRATORY_TRACT

## 2016-06-11 MED ORDER — ALBUTEROL SULFATE HFA 108 (90 BASE) MCG/ACT IN AERS
2.0000 | INHALATION_SPRAY | Freq: Four times a day (QID) | RESPIRATORY_TRACT | Status: DC | PRN
  Administered 2016-06-11: 2 via RESPIRATORY_TRACT

## 2016-06-11 MED ORDER — MEPERIDINE HCL 25 MG/ML IJ SOLN: 6.2500 mg | INTRAMUSCULAR | Status: DC | PRN

## 2016-06-11 MED ORDER — LEVOTHYROXINE SODIUM 100 MCG PO TABS
100.0000 ug | ORAL_TABLET | Freq: Every day | ORAL | Status: DC
  Administered 2016-06-12: 100 ug via ORAL
  Filled 2016-06-11: qty 1

## 2016-06-11 MED ORDER — SODIUM CHLORIDE 0.9 % IV SOLN
INTRAVENOUS | Status: DC
  Administered 2016-06-11 (×2): via INTRAVENOUS

## 2016-06-11 MED ORDER — SODIUM CHLORIDE 0.9% FLUSH
3.0000 mL | INTRAVENOUS | Status: DC | PRN
Start: 2016-06-11 — End: 2016-06-12

## 2016-06-11 MED ORDER — FENTANYL CITRATE (PF) 100 MCG/2ML IJ SOLN
INTRAMUSCULAR | Status: DC | PRN
  Administered 2016-06-11: 100 ug via INTRAVENOUS

## 2016-06-11 MED ORDER — SUGAMMADEX SODIUM 500 MG/5ML IV SOLN
INTRAVENOUS | Status: DC | PRN
  Administered 2016-06-11: 230 mg via INTRAVENOUS

## 2016-06-11 MED ORDER — PROPOFOL 10 MG/ML IV BOLUS
INTRAVENOUS | Status: DC | PRN
  Administered 2016-06-11: 60 mg via INTRAVENOUS
  Administered 2016-06-11: 40 mg via INTRAVENOUS

## 2016-06-11 MED ORDER — PHENYLEPHRINE HCL 10 MG/ML IJ SOLN
INTRAMUSCULAR | Status: DC | PRN
  Administered 2016-06-11: 80 ug via INTRAVENOUS
  Administered 2016-06-11: 40 ug via INTRAVENOUS
  Administered 2016-06-11: 80 ug via INTRAVENOUS
  Administered 2016-06-11 (×2): 40 ug via INTRAVENOUS
  Administered 2016-06-11: 80 ug via INTRAVENOUS
  Administered 2016-06-11: 40 ug via INTRAVENOUS
  Administered 2016-06-11 (×2): 80 ug via INTRAVENOUS

## 2016-06-11 MED ORDER — ACETAMINOPHEN 325 MG PO TABS: 650.0000 mg | ORAL_TABLET | ORAL | Status: DC | PRN

## 2016-06-11 MED ORDER — TIOTROPIUM BROMIDE MONOHYDRATE 2.5 MCG/ACT IN AERS: 2.0000 | INHALATION_SPRAY | Freq: Every day | RESPIRATORY_TRACT | Status: DC

## 2016-06-11 MED ORDER — IOPAMIDOL (ISOVUE-370) INJECTION 76%
INTRAVENOUS | Status: DC | PRN
  Administered 2016-06-11: 5 mL via INTRAVENOUS

## 2016-06-11 MED ORDER — BUPIVACAINE HCL (PF) 0.25 % IJ SOLN
INTRAMUSCULAR | Status: DC | PRN
  Administered 2016-06-11: 10 mL

## 2016-06-11 MED ORDER — HEPARIN SODIUM (PORCINE) 1000 UNIT/ML IJ SOLN
INTRAMUSCULAR | Status: AC
  Filled 2016-06-11: qty 1

## 2016-06-11 MED ORDER — HEPARIN SODIUM (PORCINE) 1000 UNIT/ML IJ SOLN
INTRAMUSCULAR | Status: DC | PRN
  Administered 2016-06-11: 12000 [IU] via INTRAVENOUS
  Administered 2016-06-11 (×3): 5000 [IU] via INTRAVENOUS

## 2016-06-11 MED ORDER — ALBUTEROL SULFATE (2.5 MG/3ML) 0.083% IN NEBU
2.5000 mg | INHALATION_SOLUTION | RESPIRATORY_TRACT | Status: DC | PRN
  Administered 2016-06-11 – 2016-06-12 (×2): 2.5 mg via RESPIRATORY_TRACT
  Filled 2016-06-11 (×2): qty 3

## 2016-06-11 MED ORDER — BUPIVACAINE HCL (PF) 0.25 % IJ SOLN
INTRAMUSCULAR | Status: AC
  Filled 2016-06-11: qty 30

## 2016-06-11 MED ORDER — INSULIN DEGLUDEC 100 UNIT/ML ~~LOC~~ SOPN: 70.0000 [IU] | PEN_INJECTOR | Freq: Every day | SUBCUTANEOUS | Status: DC

## 2016-06-11 MED ORDER — SODIUM CHLORIDE 0.9 % IV SOLN: 250.0000 mL | INTRAVENOUS | Status: DC | PRN

## 2016-06-11 MED ORDER — PANTOPRAZOLE SODIUM 40 MG PO TBEC
40.0000 mg | DELAYED_RELEASE_TABLET | Freq: Every day | ORAL | Status: DC
  Administered 2016-06-11 – 2016-06-12 (×2): 40 mg via ORAL
  Filled 2016-06-11 (×2): qty 1

## 2016-06-11 MED ORDER — LIDOCAINE HCL (CARDIAC) 20 MG/ML IV SOLN
INTRAVENOUS | Status: DC | PRN
  Administered 2016-06-11: 20 mg via INTRAVENOUS

## 2016-06-11 MED ORDER — FENTANYL CITRATE (PF) 100 MCG/2ML IJ SOLN
INTRAMUSCULAR | Status: AC
  Filled 2016-06-11: qty 2

## 2016-06-11 MED ORDER — HYDROCODONE-ACETAMINOPHEN 5-325 MG PO TABS
1.0000 | ORAL_TABLET | ORAL | Status: DC | PRN
  Administered 2016-06-11: 2 via ORAL
  Filled 2016-06-11: qty 2

## 2016-06-11 MED ORDER — IOPAMIDOL (ISOVUE-370) INJECTION 76%
INTRAVENOUS | Status: AC
  Filled 2016-06-11: qty 50

## 2016-06-11 MED ORDER — WARFARIN SODIUM 7.5 MG PO TABS
7.5000 mg | ORAL_TABLET | Freq: Once | ORAL | Status: AC
  Administered 2016-06-11: 7.5 mg via ORAL
  Filled 2016-06-11: qty 1

## 2016-06-11 MED ORDER — ONDANSETRON HCL 4 MG/2ML IJ SOLN: 4.0000 mg | Freq: Four times a day (QID) | INTRAMUSCULAR | Status: DC | PRN

## 2016-06-11 MED ORDER — CARVEDILOL 6.25 MG PO TABS
6.2500 mg | ORAL_TABLET | Freq: Two times a day (BID) | ORAL | Status: DC
  Administered 2016-06-12: 6.25 mg via ORAL
  Filled 2016-06-11: qty 1

## 2016-06-11 MED ORDER — SODIUM CHLORIDE 0.9% FLUSH
3.0000 mL | Freq: Two times a day (BID) | INTRAVENOUS | Status: DC
  Administered 2016-06-11 – 2016-06-12 (×2): 3 mL via INTRAVENOUS

## 2016-06-11 MED ORDER — ALBUTEROL SULFATE (2.5 MG/3ML) 0.083% IN NEBU
2.5000 mg | INHALATION_SOLUTION | Freq: Four times a day (QID) | RESPIRATORY_TRACT | Status: DC | PRN
  Administered 2016-06-11: 2.5 mg via RESPIRATORY_TRACT

## 2016-06-11 MED ORDER — HEPARIN SODIUM (PORCINE) 1000 UNIT/ML IJ SOLN
INTRAMUSCULAR | Status: DC | PRN
  Administered 2016-06-11 (×2): 1000 [IU] via INTRAVENOUS

## 2016-06-11 MED ORDER — HYDROCODONE-ACETAMINOPHEN 5-325 MG PO TABS: 1.0000 | ORAL_TABLET | Freq: Every day | ORAL | Status: DC | PRN

## 2016-06-11 MED ORDER — FENTANYL CITRATE (PF) 100 MCG/2ML IJ SOLN
25.0000 ug | INTRAMUSCULAR | Status: DC | PRN
  Administered 2016-06-11: 25 ug via INTRAVENOUS

## 2016-06-11 MED ORDER — PROTAMINE SULFATE 10 MG/ML IV SOLN
INTRAVENOUS | Status: DC | PRN
  Administered 2016-06-11 (×3): 10 mg via INTRAVENOUS

## 2016-06-11 MED ORDER — SUCCINYLCHOLINE 20MG/ML (10ML) SYRINGE FOR MEDFUSION PUMP - OPTIME
INTRAMUSCULAR | Status: DC | PRN
  Administered 2016-06-11: 140 mg via INTRAVENOUS

## 2016-06-11 MED ORDER — PROMETHAZINE HCL 25 MG/ML IJ SOLN: 6.2500 mg | INTRAMUSCULAR | Status: DC | PRN

## 2016-06-11 MED ORDER — ROCURONIUM BROMIDE 100 MG/10ML IV SOLN
INTRAVENOUS | Status: DC | PRN
  Administered 2016-06-11: 10 mg via INTRAVENOUS
  Administered 2016-06-11: 30 mg via INTRAVENOUS

## 2016-06-11 MED ORDER — WARFARIN - PHYSICIAN DOSING INPATIENT: Freq: Every day | Status: DC

## 2016-06-11 MED ORDER — TIOTROPIUM BROMIDE MONOHYDRATE 18 MCG IN CAPS
18.0000 ug | ORAL_CAPSULE | Freq: Every day | RESPIRATORY_TRACT | Status: DC
  Administered 2016-06-12: 18 ug via RESPIRATORY_TRACT
  Filled 2016-06-11: qty 5

## 2016-06-11 MED ORDER — MIDAZOLAM HCL 2 MG/2ML IJ SOLN: 0.5000 mg | Freq: Once | INTRAMUSCULAR | Status: DC | PRN

## 2016-06-11 MED ORDER — MOMETASONE FURO-FORMOTEROL FUM 100-5 MCG/ACT IN AERO
2.0000 | INHALATION_SPRAY | Freq: Two times a day (BID) | RESPIRATORY_TRACT | Status: DC
  Administered 2016-06-12: 2 via RESPIRATORY_TRACT
  Filled 2016-06-11: qty 8.8

## 2016-06-11 MED ORDER — SPIRONOLACTONE 25 MG PO TABS
12.5000 mg | ORAL_TABLET | Freq: Once | ORAL | Status: AC
  Administered 2016-06-12: 12.5 mg via ORAL
  Filled 2016-06-11: qty 1

## 2016-06-11 MED ORDER — ALBUTEROL SULFATE (2.5 MG/3ML) 0.083% IN NEBU
INHALATION_SOLUTION | RESPIRATORY_TRACT | Status: AC
  Filled 2016-06-11: qty 3

## 2016-06-11 MED ORDER — SENNOSIDES-DOCUSATE SODIUM 8.6-50 MG PO TABS
2.0000 | ORAL_TABLET | Freq: Every day | ORAL | Status: DC
  Administered 2016-06-11: 2 via ORAL
  Filled 2016-06-11: qty 2

## 2016-06-11 SURGICAL SUPPLY — 18 items
BAG SNAP BAND KOVER 36X36 (MISCELLANEOUS) ×2 IMPLANT
BLANKET WARM UNDERBOD FULL ACC (MISCELLANEOUS) ×2 IMPLANT
CATH NAVISTAR SMARTTOUCH DF (ABLATOR) ×2 IMPLANT
CATH SOUNDSTAR 3D IMAGING (CATHETERS) ×2 IMPLANT
CATH VARIABLE LASSO NAV 2515 (CATHETERS) ×2 IMPLANT
CATH WEBSTER BI DIR CS D-F CRV (CATHETERS) ×2 IMPLANT
COVER SWIFTLINK CONNECTOR (BAG) ×2 IMPLANT
NEEDLE TRANSEP BRK 71CM 407200 (NEEDLE) ×2 IMPLANT
PACK EP LATEX FREE (CUSTOM PROCEDURE TRAY) ×1
PACK EP LF (CUSTOM PROCEDURE TRAY) ×1 IMPLANT
PAD DEFIB LIFELINK (PAD) ×2 IMPLANT
PATCH CARTO3 (PAD) ×2 IMPLANT
SHEATH AVANTI 11F 11CM (SHEATH) ×2 IMPLANT
SHEATH PINNACLE 7F 10CM (SHEATH) ×4 IMPLANT
SHEATH PINNACLE 9F 10CM (SHEATH) ×2 IMPLANT
SHEATH SWARTZ TS SL2 63CM 8.5F (SHEATH) ×2 IMPLANT
SHIELD RADPAD SCOOP 12X17 (MISCELLANEOUS) ×2 IMPLANT
TUBING SMART ABLATE COOLFLOW (TUBING) ×2 IMPLANT

## 2016-06-11 NOTE — Progress Notes (Signed)
Site area: RFV x 3 Site Prior to Removal:  Level 0 Pressure Applied For: 20 min Manual:   yes Patient Status During Pull:  Wheezing, increased WOB,Respiratory Therapy paged for Rx--Symptoms improved spontaneously Post Pull Site:  Level 0 Post Pull Instructions Given: yes  Post Pull Pulses Present: palpable Dressing Applied:  tegaderm Bedrest begins @ [id number] till 2205 Comments:removed by [facility] RN-[facility]/canderson

## 2016-06-11 NOTE — Progress Notes (Signed)
Pt's o2 sats continue to drop while sleeping. Pt using [place] at 4 liters at this time. pt states that he uses cpap at him. RT called to see patient. Will continue to monitor.

## 2016-06-11 NOTE — Transfer of Care (Signed)
Immediate Anesthesia Transfer of Care Note  Patient: [name]  Procedure(s) Performed: Procedure(s): Atrial Fibrillation Ablation (N/A)  Patient Location: Cath Lab  Anesthesia Type:General  Level of Consciousness: awake, alert , oriented, patient cooperative and responds to stimulation  Airway & Oxygen Therapy: Patient Spontanous Breathing and Patient connected to face mask oxygen  Post-op Assessment: Report given to RN, Post -op Vital signs reviewed and stable and Patient moving all extremities X 4  Post vital signs: Reviewed and stable  Last Vitals:  Vitals:   06/11/16 0559  BP: 115/69  Pulse: 71  Resp: 18  Temp: 36.6 C    Last Pain:  Vitals:   06/11/16 0559  TempSrc: Oral      Patients Stated Pain Goal: 3 ([phone] [id number])  Complications: No apparent anesthesia complications

## 2016-06-11 NOTE — Progress Notes (Signed)
Administered 31mcg fentanyl IV for hip pain. Wasted 75 mcg fentanyl wittnessed by Chip Anderson-RN.  Pharmacy advised as to possible pyxis irregularity and check for same.

## 2016-06-11 NOTE — Progress Notes (Signed)
Echocardiogram Echocardiogram Transesophageal has been performed.  Joelene Millin 06/11/2016, 9:22 AM

## 2016-06-11 NOTE — Anesthesia Procedure Notes (Signed)
Procedure Name: Intubation Date/Time: 06/11/2016 8:03 AM Performed by: Tressia Miners LEFFEW Pre-anesthesia Checklist: Patient identified, Patient being monitored, Timeout performed, Emergency Drugs available and Suction available Patient Re-evaluated:Patient Re-evaluated prior to inductionOxygen Delivery Method: Circle System Utilized Preoxygenation: Pre-oxygenation with 100% oxygen Intubation Type: IV induction Ventilation: Mask ventilation without difficulty Laryngoscope Size: Mac and 4 Grade View: Grade II Tube type: Oral Tube size: 7.5 mm Number of attempts: 1 Airway Equipment and Method: Stylet Placement Confirmation: ETT inserted through vocal cords under direct vision,  positive ETCO2 and breath sounds checked- equal and bilateral Secured at: 23 cm Tube secured with: Tape Dental Injury: Teeth and Oropharynx as per pre-operative assessment

## 2016-06-11 NOTE — Progress Notes (Signed)
Pt experienced wheezing and increased WOB when the head of bed was lowered for sheath removal. Respiratory Therapy was paged and not able to respond quickly. Two puffs were given with albuterol hand held device at 1610. Improvement noted but wheezing not completley resolved. Respiratory Therapist covering [place] checked on pt and will monitor when transport occurs.

## 2016-06-11 NOTE — Anesthesia Postprocedure Evaluation (Signed)
Anesthesia Post Note  Patient: [name]  Procedure(s) Performed: Procedure(s) (LRB): Atrial Fibrillation Ablation (N/A)  Patient location during evaluation: Cath Lab Anesthesia Type: General Level of consciousness: awake and alert, oriented and patient cooperative Pain management: pain level controlled Vital Signs Assessment: post-procedure vital signs reviewed and stable Respiratory status: spontaneous breathing, nonlabored ventilation, respiratory function stable and patient connected to nasal cannula oxygen Cardiovascular status: blood pressure returned to baseline and stable Postop Assessment: no signs of nausea or vomiting Anesthetic complications: no    Last Vitals:  Vitals:   06/11/16 1250 06/11/16 1300  BP:    Pulse: 69   Resp: 19   Temp:  36.4 C    Last Pain:  Vitals:   06/11/16 1202  TempSrc: Temporal                 Samiyah Stupka,E. Maricel Swartzendruber

## 2016-06-11 NOTE — Interval H&P Note (Signed)
History and Physical Interval Note:  06/11/2016 7:41 AM  [name]  has presented today for surgery, with the diagnosis of afib  The various methods of treatment have been discussed with the patient and family. After consideration of risks, benefits and other options for treatment, the patient has consented to  Procedure(s): Atrial Fibrillation Ablation (N/A) as a surgical intervention .  The patient's history has been reviewed, patient examined, no change in status, stable for surgery.  I have reviewed the patient's chart and labs.  Questions were answered to the patient's satisfaction.    Therapeutic strategies for afib including medicine and ablation were discussed in detail with the patient today. Risk, benefits, and alternatives to EP study and radiofrequency ablation for afib were also discussed in detail today. These risks include but are not limited to stroke, bleeding, vascular damage, tamponade, perforation, damage to the esophagus, lungs, and other structures, pulmonary vein stenosis, worsening renal function, and death. The patient understands these risk and wishes to proceed.  He has some wheezing on exam this am.  He has been evaluated by Dr Glennon Mac with anesthesia who feels that we can safely proceed at this time.    [name]

## 2016-06-11 NOTE — H&P (View-Only) (Signed)
Cardiology Office Note Date:  05/24/2016  Patient ID:  [name], [name] February 09, 1934, MRN [id number] PCP:  Irven Shelling, MD  Cardiologist:  [name] Electrophysiologist: Dr. Rayann Heman   Chief Complaint: scheduled visit  History of Present Illness: [name] is a 82 y.o. male with history of persistent AFib, COPD on CPAP, left subclavian stenosis, CHF, s/p biventricular defibrillator, CAD s/p CABG, hypothyroidism, HTN, CVA.  Appears to have CRI, stage III.  Admitted 01/16/16 with acute resp. Failure with hypoxia acute COPD exacerbation RLL PNA, and acute systolic HF.  He comes in today to be seen for Dr. Rayann Heman, he is feeling fairly well, fatigued with the AF, otherwise doing OK, denies any kind of CP, or SOB, no dizziness, near syncope or syncope, no shocks from his device.  No bleeding or signs of bleeding on the warfarin.  There is a note mentioning the patient heard beeping from his device, though this ended up being a nearby phone.  He is scheduled for AF ablation with Dr. Rayann Heman, 06/11/16, he has no follow up questions regarding the procedure and wished to proceed.   Device history: patient denies any history of shocks  AFib history: 04/29/16 failed DCCV Amiodarone Scheduled for PVI ablation 06/11/16, Dr. Rayann Heman   Past Medical History:  Diagnosis Date  . Atrial fibrillation ([place])    persistent, previously seen at [facility] and placed on amiodarone  . Benign prostatic hypertrophy   . CAD (coronary artery disease)    multivessel s/p inferolateral wall MI with subsequent CABG 11/1998.  Cath 2009 with Patent grafts  . Cleft palate   . COPD with emphysema ([place]) 04/01/2010  . DM (diabetes mellitus), type 2 ([place])   . Dyspnea   . GERD (gastroesophageal reflux disease)   . HTN (hypertension)   . Hyperlipidemia   . Hypothyroidism   . Iron deficiency anemia   . Ischemic dilated cardiomyopathy    EF 35-40% by MUGA 6/11  . Myocardial infarction ([place])   . Nasal septal  deviation   . Nephrolithiasis   . OSA (obstructive sleep apnea)   . PAF (paroxysmal atrial fibrillation) ([place])   . Peripheral arterial disease ([facility])    left subclavian artery stenosis  . PNA (pneumonia)   . Psoriasis   . Seborrheic keratosis   . Stroke ([place])   . Systolic congestive heart failure ([place]) 2009   s/p BiV ICD implantation by Dr Leonia Reeves (MDT)    Past Surgical History:  Procedure Laterality Date  . BI-VENTRICULAR IMPLANTABLE CARDIOVERTER DEFIBRILLATOR  (CRT-D)  10-08-08; 11-06-2013   Dr Leonia Reeves (MDT) implant for primary prevention; gen change to MDT VivaXT CRTD by Dr Rayann Heman  . BIV ICD GENERTAOR CHANGE OUT N/A 11/06/2013   Procedure: BIV ICD GENERTAOR CHANGE OUT;  Surgeon: Coralyn Mark, MD;  Location: [facility] CATH LAB;  Service: Cardiovascular;  Laterality: N/A;  . c-spine surgery    . CARDIOVERSION N/A 04/29/2016   Procedure: CARDIOVERSION;  Surgeon: Pixie Casino, MD;  Location: [facility] ENDOSCOPY;  Service: Cardiovascular;  Laterality: N/A;  . CARPAL TUNNEL RELEASE    . CATARACT EXTRACTION    . CORONARY ARTERY BYPASS GRAFT     LIMA to LAD, SVG to OM, SVG to diagonal  . left cleft palate and left cleft lip repair      Current Outpatient Prescriptions  Medication Sig Dispense Refill  . albuterol (PROVENTIL HFA;VENTOLIN HFA) 108 (90 BASE) MCG/ACT inhaler Inhale 2 puffs into the lungs every 6 (six) hours as needed for  wheezing or shortness of breath. Must keep June 2016 appt. 1 Inhaler 0  . albuterol (PROVENTIL) (2.5 MG/3ML) 0.083% nebulizer solution USE 1 VIAL VIA NEBULIZER 4 TIMES A DAY AS DIRECTED 300 mL 2  . amiodarone (PACERONE) 400 MG tablet Take 1 tablet (400 mg total) by mouth daily. 30 tablet 6  . aspirin 81 MG tablet Take 81 mg by mouth daily.      [name] atorvastatin (LIPITOR) 80 MG tablet TAKE 1 TABLET (80 MG TOTAL) BY MOUTH DAILY. 90 tablet 2  . carvedilol (COREG) 6.25 MG tablet TAKE 1 TABLET BY MOUTH TWICE A DAY 180 tablet 2  . DULERA 100-5 MCG/ACT AERO TAKE 2 PUFFS BY  MOUTH TWICE A DAY 1 Inhaler 6  . furosemide (LASIX) 40 MG tablet Take 40 mg by mouth 2 (two) times daily.    [name] HYDROcodone-acetaminophen (NORCO/VICODIN) 5-325 MG per tablet Take 1 tablet by mouth daily as needed for moderate pain.     [name] insulin aspart (NOVOLOG) 100 UNIT/ML injection Inject 23 units into the skin before breakfast and lunch, inject 27units into the skin before supper    . levothyroxine (SYNTHROID, LEVOTHROID) 100 MCG tablet Take 100 mcg by mouth daily before breakfast.     . Multiple Vitamin (MULTIVITAMIN) tablet Take 1 tablet by mouth daily.      [name] omeprazole (PRILOSEC) 20 MG capsule Take 20 mg by mouth daily.      [name] senna-docusate (SENOKOT-S) 8.6-50 MG tablet Take 2 tablets by mouth at bedtime. 30 tablet 0  . spironolactone (ALDACTONE) 25 MG tablet TAKE 1/2 TABLET (12.5 MG TOTAL) BY MOUTH DAILY. 45 tablet 10  . Tiotropium Bromide Monohydrate (SPIRIVA RESPIMAT) 2.5 MCG/ACT AERS Inhale 2 puffs into the lungs daily. 1 Inhaler 5  . traMADol (ULTRAM) 50 MG tablet Take 1 tablet by mouth daily as needed (pain).     [name] FLEXTOUCH 100 UNIT/ML SOPN Inject 70 Units into the skin daily. MORNINGS  3  . vitamin B-12 (CYANOCOBALAMIN) 1000 MCG tablet Take 1,000 mcg by mouth daily.    [name] warfarin (COUMADIN) 5 MG tablet Take 5mg  everyday except on Tuesday and Thursdays take 7.5mg  Please hold coumadin on 3/25, restart coumadin on 3/26, check INR on Monday 3/27. 40 tablet 3  . warfarin (COUMADIN) 5 MG tablet TAKE AS DIRECTED BY COUMADIN CLINIC 40 tablet 3   No current facility-administered medications for this visit.     Allergies:   Review of patient's allergies indicates no known allergies.   Social History:  The patient  reports that he quit smoking about 17 years ago. His smoking use included Cigarettes. He has a 195.00 pack-year smoking history. He has never used smokeless tobacco. He reports that he does not drink alcohol or use drugs.   Family History:  The patient's family history  includes Asthma in his father; Heart attack in his brother; Heart disease in his brother; Hypertension in his brother, father, mother, and sister; Stroke in his father.  ROS:  Please see the history of present illness.  All other systems are reviewed and otherwise negative.   PHYSICAL EXAM:  VS:  BP 116/64 (BP Location: Right Arm, Patient Position: Sitting, Cuff Size: Normal)   Pulse 85   Ht 5\' 8"  (1.727 m)   Wt 257 lb (116.6 kg)   BMI 39.08 kg/m  BMI: Body mass index is 39.08 kg/m. Well nourished, well developed, obese, in no acute distress  HEENT: normocephalic, atraumatic  Neck: no JVD, carotid bruits or masses  Cardiac:  (paced) RRR; no significant murmurs, no rubs, or gallops Lungs:  clear to auscultation bilaterally, no wheezing, rhonchi or rales  Abd: soft, nontender MS: no deformity or atrophy Ext:  1+ pitting edema b/l (reported to be about his baseline) Skin: warm and dry, no rash Neuro:  No gross deficits appreciated Psych: euthymic mood, full affect  ICD site is stable, no tethering or discomfort   EKG:  Done today shows V paced rhythm Device interrogation today with normal function, AFib, no arrhythmias otherwise only 80% BiVe pacing   01/11/16: Echocardiogram Study Conclusions - Left ventricle: The cavity size was normal. Wall thickness was   increased in a pattern of mild LVH. Systolic function was   moderately reduced. The estimated ejection fraction was in the   range of 35% to 40%. Anterior and inferolateral hypokinesis   noted. Features are consistent with a pseudonormal left   ventricular filling pattern, with concomitant abnormal relaxation   and increased filling pressure (grade 2 diastolic dysfunction). - Aortic valve: There was no stenosis. - Mitral valve: There was no significant regurgitation. - Left atrium: The atrium was moderately to severely dilated. 53mm - Right ventricle: Poorly visualized. The cavity size was normal.   Pacer wire or catheter  noted in right ventricle. Systolic   function was normal. - Pulmonary arteries: PA peak pressure: 23 mm Hg (S). - Inferior vena cava: The vessel was normal in size. The   respirophasic diameter changes were in the normal range (>= 50%),   consistent with normal central venous pressure.   Recent Labs: 03/12/2016: Brain Natriuretic Peptide 262.8 04/21/2016: ALT 16; BUN 22; Creat 1.52; Hemoglobin 10.9; Platelets 295; Potassium 4.5; Sodium 138; TSH 2.14  No results found for requested labs within last 8760 hours.   CrCl cannot be calculated (Patient's most recent lab result is older than the maximum 21 days allowed.).   Wt Readings from Last 3 Encounters:  05/24/16 257 lb (116.6 kg)  05/05/16 257 lb (116.6 kg)  04/29/16 253 lb (114.8 kg)     Other studies reviewed: Additional studies/records reviewed today include: summarized above  DEVICE information: MDT CRT-D, primary prevention, implanted 10/08/08, gen change 11/06/13, Dr. Rayann Heman No history of shocks reported  ASSESSMENT AND PLAN:  1.  Persistent atrial fibrillation Pt remains symptomatic with fatigue and increased shortness of breath  He has decreased CRT pacing 2/2 AF Continue Warfarin for CHADS2VASC of 8  Failed recent DCCV despite 1 week of increased amiodarone Dr. Rayann Heman attempted to pace terminate 05/04/16 without success Dr. Rayann Heman discussed teatment options discussed with patient including attempted repeat cardioversion, AVN ablation or PVI at is visit earlier this month and was decided to proceed with PVI ablation at the next available time. weekly INR's have been therapeutic, continue until his scheduled ablation on 06/11/16  Not on ACE/ARB currently, his renal function has waxed/waned, stage II area, will hold off for now  2. Chronic systolic dysfunction, ICM, CRT-D      Some edema is appreciated, no rest SOB, no symptoms of PND/orthopnea     Optivol looks OK       Normal ICD function     No changes today  3.   OSA      CPAP compliance encouraged  4.  CAD      No recent ischemic symptoms      Will defer need for concomitant ASA to Dr Irish Lack  5.  HTN        Stable  No change required today  6.  Obesity       Weight loss encouraged  Disposition: Continue weekly INR's up to his procedure, f/u with pre-procedure labs 06/03/16 and CT scan 06/04/16 as scheduled, his planned AF ablation 06/11/16, and f/u with AF clinic and Dr. Rayann Heman post procedure  Current medicines are reviewed at length with the patient today.  The patient did not have any concerns regarding medicines.  Haywood Lasso, PA-C 05/24/2016 4:33 PM     [address] [place] [address] [phone] (office)  [phone] (fax)

## 2016-06-12 ENCOUNTER — Ambulatory Visit (HOSPITAL_COMMUNITY): Payer: Medicare Other

## 2016-06-12 DIAGNOSIS — R0602 Shortness of breath: Secondary | ICD-10-CM

## 2016-06-12 DIAGNOSIS — I4892 Unspecified atrial flutter: Secondary | ICD-10-CM | POA: Diagnosis not present

## 2016-06-12 DIAGNOSIS — Z951 Presence of aortocoronary bypass graft: Secondary | ICD-10-CM | POA: Diagnosis not present

## 2016-06-12 DIAGNOSIS — I481 Persistent atrial fibrillation: Secondary | ICD-10-CM | POA: Diagnosis not present

## 2016-06-12 DIAGNOSIS — Z950 Presence of cardiac pacemaker: Secondary | ICD-10-CM | POA: Diagnosis not present

## 2016-06-12 DIAGNOSIS — I480 Paroxysmal atrial fibrillation: Secondary | ICD-10-CM | POA: Diagnosis not present

## 2016-06-12 DIAGNOSIS — I252 Old myocardial infarction: Secondary | ICD-10-CM | POA: Diagnosis not present

## 2016-06-12 DIAGNOSIS — I2510 Atherosclerotic heart disease of native coronary artery without angina pectoris: Secondary | ICD-10-CM | POA: Diagnosis not present

## 2016-06-12 LAB — GLUCOSE, CAPILLARY
Glucose-Capillary: 208 mg/dL — ABNORMAL HIGH (ref 65–99)
Glucose-Capillary: 213 mg/dL — ABNORMAL HIGH (ref 65–99)
Glucose-Capillary: 82 mg/dL (ref 65–99)

## 2016-06-12 LAB — PROTIME-INR
INR: 2.56
Prothrombin Time: 28 seconds — ABNORMAL HIGH (ref 11.4–15.2)

## 2016-06-12 MED ORDER — FUROSEMIDE 10 MG/ML IJ SOLN
INTRAMUSCULAR | Status: AC
  Filled 2016-06-12: qty 8

## 2016-06-12 MED ORDER — FUROSEMIDE 10 MG/ML IJ SOLN: 40.0000 mg | Freq: Once | INTRAMUSCULAR | Status: DC

## 2016-06-12 MED ORDER — FUROSEMIDE 10 MG/ML IJ SOLN
80.0000 mg | Freq: Once | INTRAMUSCULAR | Status: AC
  Administered 2016-06-12: 80 mg via INTRAVENOUS

## 2016-06-12 MED ORDER — AMIODARONE HCL 400 MG PO TABS: 200.0000 mg | ORAL_TABLET | Freq: Every day | ORAL | 6 refills | Status: DC

## 2016-06-12 NOTE — Progress Notes (Signed)
At 0300 pt called out saying that he was having difficulty breathing. Pt sats remained stable on 40% ventimask at 92% however pt with obvious increased effort to breathe. Pt not able to talk in complete sentences d/t to increased in respiratory effort as well. At 0315 pt received PRN albuterol with no relief noticed. Cardiology fellow paged to bedside. Orders received and implement. Pt's wife updated on pt's condition at this time as well. Pt already with improved breathing at this time. Will continue to monitor.

## 2016-06-12 NOTE — Care Management Note (Signed)
Case Management Note  Patient Details  Name: [name] MRN: [id number] Date of Birth: August 04, 1934  Subjective/Objective:                  COPD, chronic respiratory failure  Action/Plan: CM received call regarding home oxygen. CM called [place], [place], to advise of home oxygen orders. Cm called nurse and advised that patient needs to have his portable oxygen tank before being discharged. Cm paged Jettie Booze, PAC to advise that liter flow needed on order. No further CM needs at this time.    Expected Discharge Date:  06/12/16               Expected Discharge Plan:  Home/Self Care  In-House Referral:     Discharge planning Services  CM Consult  Post Acute Care Choice:  Durable Medical Equipment Choice offered to:  Patient  DME Arranged:  Oxygen DME Agency:  [place]:    [facility] Agency:     Status of Service:  In process, will continue to follow  If discussed at Long Length of Stay Meetings, dates discussed:    Additional Comments:  Guido Sander, RN 06/12/2016, 4:57 PM

## 2016-06-12 NOTE — Discharge Instructions (Signed)
No driving for 1 week. No lifting over 5 lbs for 1 week.   Keep procedure site clean & dry. If you notice increased pain, swelling, bleeding or pus, call/return!  You may shower, but no soaking baths/hot tubs/pools for 1 week.

## 2016-06-12 NOTE — Progress Notes (Signed)
SATURATION QUALIFICATIONS: (This note is used to comply with regulatory documentation for home oxygen)  Patient Saturations on Room Air at Rest = 85  Patient Saturations on Room Air while Ambulating =82  Patient Saturations on 2 Liters of oxygen while Ambulating = 92%  Please briefly explain why patient needs home oxygen: Dyspnea  And hypoxia on exertion

## 2016-06-12 NOTE — Progress Notes (Signed)
Worked with Allred MD today to progress pt to discharge. Pt was ambulated in room where O2 sats were obtained-. The care manager I spoke to on the phone directed me as to where to placed the note for home O2. Once that was done the patient was visited by the home [place] and received a tank they delivered. Patient has remained alert and oriented thruout  Shift.

## 2016-06-12 NOTE — Discharge Summary (Signed)
Physician Discharge Summary      Patient ID: [name] MRN: [id number] DOB/AGE: October 02, 1934 82 y.o.  Admit date: 06/11/2016 Discharge date: 06/12/2016  Primary Discharge Diagnosis persistent atrial fibrillation, atrial flutter Secondary Discharge Diagnosis COPD, chronic respiratory failure  Significant Diagnostic Studies: AF ablation  Hospital Course: The patient presented for elective afib ablation.  His initial O2 sats on arrival were mid 80s and he was wheezing.  He reported this as his baseline.  After inhaler administration, anesthesia felt that we could proceed.  He underwent uneventful afib ablation.   He was observed overnight during which he required BiPAP.  He was initiated on home O2 and instructed to contact Dr Halford Chessman on Monday for urgent outpatient pulmonary follow-up.   At time of discharge, he reported his breathing to be at his baseline.   Discharge Exam:  Physical Exam: Vitals:   06/12/16 0315 06/12/16 0351 06/12/16 0404 06/12/16 0834  BP:  129/73 127/75   Pulse: 80 80 72   Resp: (!) 25 (!) 28 19   Temp:  97.7 F (36.5 C)    TempSrc:  Oral    SpO2: 92% 90% 97% 97%  Weight:      Height:        GEN- The patient is chronically ill, obese appearing, alert and oriented x 3 today.   Head- normocephalic, atraumatic Eyes-  Sclera clear, conjunctiva pink Ears- hearing intact Oropharynx- clear Neck- supple, Lungs- prolonged expiratory phase, no active wheezing, normal work of breathing Heart- Regular rate and rhythm  GI- soft, NT, ND, + BS Extremities- no clubbing, cyanosis, or edema, groin is without hematoma/ bruit Psych- euthymic mood, full affect Neuro- strength and sensation are intact    Labs:   Lab Results  Component Value Date   WBC 8.4 06/02/2016   HGB 10.4 (L) 06/02/2016   HCT 32.9 (L) 06/02/2016   MCV 72.8 (L) 06/02/2016   PLT 260 06/02/2016   No results for input(s): NA, K, CL, CO2, BUN, CREATININE, CALCIUM, PROT, BILITOT, ALKPHOS,  ALT, AST, GLUCOSE in the last 168 hours.  Invalid input(s): LABALBU Lab Results  Component Value Date   CKTOTAL 98 11/28/2008   CKMB 2.5 11/28/2008   TROPONINI 0.04 (H) 01/11/2016    Lab Results  Component Value Date   CHOL 137 11/12/2014   CHOL 123 04/29/2014   CHOL 124 11/01/2013   Lab Results  Component Value Date   HDL 50 11/12/2014   HDL 45 04/29/2014   HDL 43 11/01/2013   Lab Results  Component Value Date   LDLCALC 68 11/12/2014   LDLCALC 55 04/29/2014   LDLCALC 62 11/01/2013   Lab Results  Component Value Date   TRIG 95 11/12/2014   TRIG 114 04/29/2014   TRIG 93 11/01/2013   Lab Results  Component Value Date   CHOLHDL 2.6 11/29/2008   No results found for: LDLDIRECT    FOLLOW UP PLANS AND APPOINTMENTS    Medication List    TAKE these medications   albuterol 108 (90 Base) MCG/ACT inhaler Commonly known as:  PROVENTIL HFA;VENTOLIN HFA Inhale 2 puffs into the lungs every 6 (six) hours as needed for wheezing or shortness of breath. Must keep June 2016 appt.   albuterol (2.5 MG/3ML) 0.083% nebulizer solution Commonly known as:  PROVENTIL USE 1 VIAL VIA NEBULIZER 4 TIMES A DAY AS DIRECTED   amiodarone 400 MG tablet Commonly known as:  PACERONE Take 0.5 tablets (200 mg total) by mouth daily. What changed:  how much to take   aspirin 81 MG tablet Take 81 mg by mouth daily.   atorvastatin 80 MG tablet Commonly known as:  LIPITOR TAKE 1 TABLET (80 MG TOTAL) BY MOUTH DAILY.   carvedilol 6.25 MG tablet Commonly known as:  COREG TAKE 1 TABLET BY MOUTH TWICE A DAY   DULERA 100-5 MCG/ACT Aero Generic drug:  mometasone-formoterol TAKE 2 PUFFS BY MOUTH TWICE A DAY   furosemide 40 MG tablet Commonly known as:  LASIX Take 40 mg by mouth 2 (two) times daily.   HYDROcodone-acetaminophen 5-325 MG tablet Commonly known as:  NORCO/VICODIN Take 1 tablet by mouth daily as needed for moderate pain.   insulin aspart 100 UNIT/ML injection Commonly known as:   novoLOG Inject 23 units into the skin before breakfast and lunch, inject 27units into the skin before supper   levothyroxine 100 MCG tablet Commonly known as:  SYNTHROID, LEVOTHROID Take 100 mcg by mouth daily before breakfast.   multivitamin tablet Take 1 tablet by mouth daily.   omeprazole 20 MG capsule Commonly known as:  PRILOSEC Take 20 mg by mouth daily.   senna-docusate 8.6-50 MG tablet Commonly known as:  Senokot-S Take 2 tablets by mouth at bedtime.   spironolactone 25 MG tablet Commonly known as:  ALDACTONE TAKE 1/2 TABLET (12.5 MG TOTAL) BY MOUTH DAILY.   Tiotropium Bromide Monohydrate 2.5 MCG/ACT Aers Commonly known as:  SPIRIVA RESPIMAT Inhale 2 puffs into the lungs daily.   traMADol 50 MG tablet Commonly known as:  ULTRAM Take 1 tablet by mouth daily as needed (pain).   TRESIBA FLEXTOUCH 100 UNIT/ML Sopn Generic drug:  Insulin Degludec Inject 70 Units into the skin daily. MORNINGS   vitamin B-12 1000 MCG tablet Commonly known as:  CYANOCOBALAMIN Take 1,000 mcg by mouth daily.   warfarin 5 MG tablet Commonly known as:  COUMADIN Take 5 mg by mouth daily.   warfarin 5 MG tablet Commonly known as:  COUMADIN Take 5mg  everyday except on Tuesday and Thursdays take 7.5mg  Please hold coumadin on 3/25, restart coumadin on 3/26, check INR on Monday 3/27.   warfarin 5 MG tablet Commonly known as:  COUMADIN TAKE AS DIRECTED BY COUMADIN CLINIC      Follow-up Information    [facility] Follow up on 06/21/2016.   Specialty:  Cardiology Why:  8:30 AM for coumadin check Contact information: [address] [place], [place] Follow up on 07/09/2016.   Specialties:  Nurse Practitioner, Cardiology Why:  8:30 am Contact information: [place] [address] [phone]        Thompson Grayer, MD Follow up in 3 month(s).   Specialty:  Cardiology Contact  information: [place] [address] [phone]           BRING ALL MEDICATIONS WITH YOU TO FOLLOW UP APPOINTMENTS  Time spent with patient to include physician time: 16 Signed: Thompson Grayer, MD 06/12/2016, 10:42 AM

## 2016-06-14 ENCOUNTER — Encounter (HOSPITAL_COMMUNITY): Payer: Self-pay | Admitting: Internal Medicine

## 2016-06-14 ENCOUNTER — Telehealth: Payer: Self-pay | Admitting: *Deleted

## 2016-06-14 NOTE — Telephone Encounter (Signed)
Pt scheduled for OV with VS 06/17/16 at 11:45. Nothing further needed.

## 2016-06-14 NOTE — Telephone Encounter (Signed)
-----   Message from Chesley Mires, MD sent at 06/13/2016  8:17 AM EDT ----- Will have my nurse schedule him for ROV with me or NP this week.  [place]  ----- Message ----- From: Thompson Grayer, MD Sent: 06/12/2016  10:37 AM To: Chesley Mires, MD  Presented yesterday for AF ablation and was very wheezy. His sats on arrival to [facility] for elective procedure were mid 80s.   Had breathing treatment and then anesthesia felt that we could proceed.  Procedure went well.   Overnight, required BiPAP and felt that this was better than his home CPAP.  I started him on home O2 at discharge.  Could you get him in to your office this coming week? Probably needs close follow-up to prevent re-hospitalization.  Thanks! Jeneen Rinks

## 2016-06-17 ENCOUNTER — Telehealth: Payer: Self-pay | Admitting: Cardiology

## 2016-06-17 ENCOUNTER — Ambulatory Visit (INDEPENDENT_AMBULATORY_CARE_PROVIDER_SITE_OTHER): Payer: Medicare Other | Admitting: Pulmonary Disease

## 2016-06-17 ENCOUNTER — Encounter: Payer: Self-pay | Admitting: Pulmonary Disease

## 2016-06-17 ENCOUNTER — Ambulatory Visit (INDEPENDENT_AMBULATORY_CARE_PROVIDER_SITE_OTHER): Payer: Medicare Other

## 2016-06-17 VITALS — BP 132/80 | HR 73 | Ht 68.0 in | Wt 265.6 lb

## 2016-06-17 DIAGNOSIS — I5022 Chronic systolic (congestive) heart failure: Secondary | ICD-10-CM

## 2016-06-17 DIAGNOSIS — J432 Centrilobular emphysema: Secondary | ICD-10-CM | POA: Diagnosis not present

## 2016-06-17 DIAGNOSIS — R05 Cough: Secondary | ICD-10-CM

## 2016-06-17 DIAGNOSIS — I255 Ischemic cardiomyopathy: Secondary | ICD-10-CM

## 2016-06-17 DIAGNOSIS — G4733 Obstructive sleep apnea (adult) (pediatric): Secondary | ICD-10-CM | POA: Diagnosis not present

## 2016-06-17 DIAGNOSIS — R058 Other specified cough: Secondary | ICD-10-CM

## 2016-06-17 DIAGNOSIS — Z95810 Presence of automatic (implantable) cardiac defibrillator: Secondary | ICD-10-CM

## 2016-06-17 MED ORDER — FLUTICASONE PROPIONATE 50 MCG/ACT NA SUSP: 2.0000 | Freq: Every day | NASAL | 2 refills | Status: DC

## 2016-06-17 NOTE — Patient Instructions (Signed)
Will arrange for overnight oxygen test  Nasal irrigation (saline nasal spray) daily Flonase 1 spray each nostril daily  Will get copy of CPAP report  Follow up in 4 weeks with Dr. Halford Chessman or Nurse Practitioner

## 2016-06-17 NOTE — Progress Notes (Signed)
EPIC Encounter for ICM Monitoring  Patient Name: [name] is a 82 y.o. male Date: 06/17/2016 Primary Care Physican: Irven Shelling, MD Primary Cardiologist: Irish Lack Electrophysiologist: Allred Dry Weight: 261 lb  Bi-V Pacing:  99.6%       Spoke with wife. Heart Failure questions reviewed, pt symptomatic with swollen ankles and weight gain of at least 5 lbs.  Wife reported weight ranges 250-256 lbs.  Patient weighed, on 05/17/2016 at home, 250 lbs.  Afib Ablation procedure performed on 06/11/2016  Thoracic impedance abnormal suggesting fluid accumulation since 05/27/2016.        LABS:         06/02/2016 Creatinine 1.58, BUN 27, Potassium 5.3, Sodium 139       04/21/2016 Creatinine 1.52, BUN 22, Potassium 4.5, Sodium 138       03/12/2016 Creatinine 1.54, BUN 27, Potassium 4.1, Sodium 139       01/16/2016 Creatinine 1.85, BUN 60, Potassium 5.1, Sodium 140       01/15/2016 Creatinine 1.55, BUN 56, Potassium 4.8, Sodium 138       01/14/2016 Creatinine 1.49, BUN 50, Potassium 4.7, Sodium 134        01/13/2016 Creatinine 1.73, BUN 51, Potassium 5.2, Sodium 137       01/12/2016 Creatinine 1.72, BUN 38, Potassium 4.7, Sodium 137        01/11/2016 Creatinine 1.43, BUN 21, Potassium 4.9, Sodium 142  Recommendations: Reviewed with Dr Rayann Heman in the office and recommended to increase Furosemide 40 mg to 2 tablets (80 mg total) bid x 3 days and return to prescribed dosage of 40 mg 1 tablet bid.  Repeat transmission 06/23/2016.     Call to wife and advised of Dr Jackalyn Lombard recommendation as noted above.  She verbalized understanding.    Follow-up plan: ICM clinic phone appointment on 06/23/2016.  Copy of ICM check sent to primary cardiologist and device physician.   ICM trend: 06/17/2016       Rosalene Billings, RN 06/17/2016 2:43 PM

## 2016-06-17 NOTE — Progress Notes (Signed)
Current Outpatient Prescriptions on File Prior to Visit  Medication Sig  . albuterol (PROVENTIL HFA;VENTOLIN HFA) 108 (90 BASE) MCG/ACT inhaler Inhale 2 puffs into the lungs every 6 (six) hours as needed for wheezing or shortness of breath. Must keep June 2016 appt.  [name] albuterol (PROVENTIL) (2.5 MG/3ML) 0.083% nebulizer solution USE 1 VIAL VIA NEBULIZER 4 TIMES A DAY AS DIRECTED  . amiodarone (PACERONE) 400 MG tablet Take 0.5 tablets (200 mg total) by mouth daily.  [name] aspirin 81 MG tablet Take 81 mg by mouth daily.    [name] atorvastatin (LIPITOR) 80 MG tablet TAKE 1 TABLET (80 MG TOTAL) BY MOUTH DAILY.  . carvedilol (COREG) 6.25 MG tablet TAKE 1 TABLET BY MOUTH TWICE A DAY  . DULERA 100-5 MCG/ACT AERO TAKE 2 PUFFS BY MOUTH TWICE A DAY  . furosemide (LASIX) 40 MG tablet Take 40 mg by mouth 2 (two) times daily.  [name] HYDROcodone-acetaminophen (NORCO/VICODIN) 5-325 MG per tablet Take 1 tablet by mouth daily as needed for moderate pain.   [name] insulin aspart (NOVOLOG) 100 UNIT/ML injection Inject 23 units into the skin before breakfast and lunch, inject 27units into the skin before supper  . levothyroxine (SYNTHROID, LEVOTHROID) 100 MCG tablet Take 100 mcg by mouth daily before breakfast.   . Multiple Vitamin (MULTIVITAMIN) tablet Take 1 tablet by mouth daily.    [name] omeprazole (PRILOSEC) 20 MG capsule Take 20 mg by mouth daily.    [name] senna-docusate (SENOKOT-S) 8.6-50 MG tablet Take 2 tablets by mouth at bedtime.  [name] spironolactone (ALDACTONE) 25 MG tablet TAKE 1/2 TABLET (12.5 MG TOTAL) BY MOUTH DAILY.  [name] Tiotropium Bromide Monohydrate (SPIRIVA RESPIMAT) 2.5 MCG/ACT AERS Inhale 2 puffs into the lungs daily.  [name] FLEXTOUCH 100 UNIT/ML SOPN Inject 70 Units into the skin daily. MORNINGS  . vitamin B-12 (CYANOCOBALAMIN) 1000 MCG tablet Take 1,000 mcg by mouth daily.  [name] warfarin (COUMADIN) 5 MG tablet Take 5 mg by mouth daily.   No current facility-administered medications on file prior to visit.     Chief  Complaint  Patient presents with  . Follow-up    Pt reports no change since d/c from hospital. O2 started upon d/c home [facility]) @ 2 liters 24/7.  Pt denies wheezing.     Sleep tests PSG 06/14/06>>AHI 35.5   Pulmonary tests PFT 03/30/11>>FEV1 1.98(72%), FEV1% 68, DLCO 64%  PFT 04/19/12>>FEV1 1.65 (67%), FEV1% 63, TLC 5.66 (99%), DLCO 71%, no BD. PFT 12/19/13 >> FEV1 2.03 (77%), FEV1% 72, TLC 6.45 (98%), DLCO 49% CT chest 01/18/14 >> mild centrilobular and paraseptal emphysema, mild fibrotic changes Rt periphery CT chest 01/15/16 >> emphysema  Cardiac tests Echo 01/11/16 >> EF 35 to 40%, mild LVH, grade 2 diastolic CHF, mod/severe LA dilation, PAS 23 mmHg  Past medical history CAD, systolic CHF, PAD, HTN, DM, HLD, A fib, Hypothyroidism, BPH, Psoriasis, GERD  Past surgical history, Family history, Social history, Allergies reviewed.  Vital signs BP 132/80 (BP Location: Left Arm, Cuff Size: Normal)   Pulse 73   Ht 5\' 8"  (1.727 m)   Wt 265 lb 9.6 oz (120.5 kg)   SpO2 98%   BMI 40.38 kg/m    History of Present Illness: [name] is a 82 y.o. male former smoker with COPD/Emphysema, and OSA.  He was in hospital for ablation for A fib.  He was noted to have wheezing, and started on oxygen.  He was able to have ablation.  His chest xray showed mild CHF pattern.  He still has wheezing from his throat.  He is having more sinus congestion and post-nasal drip.  This triggers a cough.  He denies chest pain.  He has ankle swelling, but improved compared to before.  Denies fever.  He takes prilosec for reflux, and feels his reflux is controlled.  Ambulatory oximetry today on room >> SpO2 stayed above 90%.  Physical Exam:  General - No distress ENT - No sinus tenderness, no oral exudate, changes of cleft lip surgery, nasal septal deviation, no LAN, wheeze over throat Cardiac - s1s2 regular, no murmur Chest - b/l expiratory wheezing >> much improved with pursed lip breathing Back -  No focal tenderness Abd - Soft, non-tender Ext - No edema Neuro - Normal strength Skin - No rashes Psych - normal mood, and behavior   Assessment/Plan:  Upper airway cough from post-nasal drip. - I think his current wheezing is coming from this - will have him use nasal irrigation and flonase  COPD with emphysema. - continue spiriva, dulera, prn albuterol  Obstructive sleep apnea. - will get copy of his CPAP download and then determine if he needs an in lab titration study  Hypoxia. - his SpO2 on room at rest and with exertion remained above 90% - will arrange for ONO with CPAP and room air to determine if he needs to continue supplemental oxygen  Chronic combined CHF. A fib s/p ablation. - I suspect some of his recent wheezing could also have been related to interstitial edema - continue diuresis per cardiology    Patient Instructions  Will arrange for overnight oxygen test  Nasal irrigation (saline nasal spray) daily Flonase 1 spray each nostril daily  Will get copy of CPAP report  Follow up in 4 weeks with Dr. Halford Chessman or Nurse Practitioner   Chesley Mires, MD [facility] Pager:  [phone] 06/17/2016, 12:27 PM

## 2016-06-17 NOTE — Telephone Encounter (Signed)
LMOVM reminding pt to send remote transmission.   

## 2016-06-21 ENCOUNTER — Ambulatory Visit (INDEPENDENT_AMBULATORY_CARE_PROVIDER_SITE_OTHER): Payer: Medicare Other

## 2016-06-21 DIAGNOSIS — Z7901 Long term (current) use of anticoagulants: Secondary | ICD-10-CM

## 2016-06-21 DIAGNOSIS — I4891 Unspecified atrial fibrillation: Secondary | ICD-10-CM

## 2016-06-21 DIAGNOSIS — I480 Paroxysmal atrial fibrillation: Secondary | ICD-10-CM | POA: Diagnosis not present

## 2016-06-21 LAB — POCT INR: INR: 3.2

## 2016-06-22 ENCOUNTER — Telehealth: Payer: Self-pay | Admitting: Pulmonary Disease

## 2016-06-22 DIAGNOSIS — G4733 Obstructive sleep apnea (adult) (pediatric): Secondary | ICD-10-CM

## 2016-06-22 NOTE — Telephone Encounter (Signed)
Auto CPAP 05/19/16 to 06/17/16 >> used on 28 to 30 nights with average 6 hrs 57 min.  Average AHI 18.7 with median CPAP 8 and 95 th percentile CPAP 11 cm H2O.   Will have my nurse inform pt that sleep apnea number is still high on current CPAP settings.  He needs to have in lab titration study.  If he is agreeable to this, then please place order for in lab titration study.

## 2016-06-22 NOTE — Telephone Encounter (Signed)
Spoke with pt's wife and they have not heard from [facility] about getting ONO done.  Spoke with Melissa with [facility]  and she is going to check on this and call us back.

## 2016-06-23 ENCOUNTER — Ambulatory Visit (INDEPENDENT_AMBULATORY_CARE_PROVIDER_SITE_OTHER): Payer: Medicare Other

## 2016-06-23 ENCOUNTER — Other Ambulatory Visit: Payer: Medicare Other | Admitting: *Deleted

## 2016-06-23 ENCOUNTER — Telehealth: Payer: Self-pay

## 2016-06-23 DIAGNOSIS — I5022 Chronic systolic (congestive) heart failure: Secondary | ICD-10-CM

## 2016-06-23 DIAGNOSIS — Z95810 Presence of automatic (implantable) cardiac defibrillator: Secondary | ICD-10-CM

## 2016-06-23 NOTE — Telephone Encounter (Signed)
Remote ICM transmission received.  Attempted call to wife and left message for return call.   

## 2016-06-23 NOTE — Telephone Encounter (Signed)
Results have been explained to patient, pt expressed understanding. Order placed for lab titration study. Nothing further needed.

## 2016-06-23 NOTE — Telephone Encounter (Signed)
lmtcb x1 for pt and his wife. 

## 2016-06-23 NOTE — Progress Notes (Signed)
EPIC Encounter for ICM Monitoring  Patient Name: [name] is a 82 y.o. male Date: 06/23/2016 Primary Care Physican: Irven Shelling, MD Primary [place] Electrophysiologist: Allred Dry Weight: 254 lb  Bi-V Pacing:  99.8%       Spoke with wife. Heart Failure questions reviewed, pt asymptomatic since taking extra Furosemide x 3 days 06/18/2016.  Weight is within baseline of 250 - 255 lbs and no swelling of lower extremities.  Since 06/17/2016 transmission and 3 days of extra Furosemide, thoracic impedance continues to be abnormal suggesting fluid accumulation.  Impedance returned to baseline for one day, 06/18/2016.        LABS:         06/02/2016 Creatinine 1.58, BUN 27, Potassium 5.3, Sodium 139       04/21/2016 Creatinine 1.52, BUN 22, Potassium 4.5, Sodium 138       03/12/2016 Creatinine 1.54, BUN 27, Potassium 4.1, Sodium 139       01/16/2016 Creatinine 1.85, BUN 60, Potassium 5.1, Sodium 140       01/15/2016 Creatinine 1.55, BUN 56, Potassium 4.8, Sodium 138       01/14/2016 Creatinine 1.49, BUN 50, Potassium 4.7, Sodium 134        01/13/2016 Creatinine 1.73, BUN 51, Potassium 5.2, Sodium 137       01/12/2016 Creatinine 1.72, BUN 38, Potassium 4.7, Sodium 137        01/11/2016 Creatinine 1.43, BUN 21, Potassium 4.9, Sodium 142   Recommendations: Reviewed with Chanetta Marshall, NP in the office.  Recommended to have BMET and BNP drawn today and will make recommendations after results are received.    Call back to wife and requested to bring patient in for labs today and she stated they would be here this afternoon.  Explained recommendations will be made after lab results are received.   Follow-up plan: ICM clinic phone appointment on 06/30/2016.  Copy of ICM check sent to primary cardiologist and device physician.   ICM trend: 06/23/2016       Rosalene Billings, RN 06/23/2016 10:56 AM

## 2016-06-23 NOTE — Telephone Encounter (Signed)
[name] cb, [phone] States they did not ONO previously but they have it now and patient will heard from them sometime next week

## 2016-06-24 ENCOUNTER — Ambulatory Visit (HOSPITAL_BASED_OUTPATIENT_CLINIC_OR_DEPARTMENT_OTHER): Payer: Medicare Other | Attending: Pulmonary Disease | Admitting: Pulmonary Disease

## 2016-06-24 VITALS — Ht 68.0 in | Wt 255.0 lb

## 2016-06-24 DIAGNOSIS — I5020 Unspecified systolic (congestive) heart failure: Secondary | ICD-10-CM | POA: Diagnosis not present

## 2016-06-24 DIAGNOSIS — G4733 Obstructive sleep apnea (adult) (pediatric): Secondary | ICD-10-CM | POA: Insufficient documentation

## 2016-06-24 DIAGNOSIS — I493 Ventricular premature depolarization: Secondary | ICD-10-CM | POA: Insufficient documentation

## 2016-06-24 DIAGNOSIS — Z79899 Other long term (current) drug therapy: Secondary | ICD-10-CM | POA: Diagnosis not present

## 2016-06-24 DIAGNOSIS — J449 Chronic obstructive pulmonary disease, unspecified: Secondary | ICD-10-CM | POA: Diagnosis not present

## 2016-06-24 DIAGNOSIS — Z9989 Dependence on other enabling machines and devices: Secondary | ICD-10-CM

## 2016-06-24 DIAGNOSIS — G4761 Periodic limb movement disorder: Secondary | ICD-10-CM | POA: Insufficient documentation

## 2016-06-24 LAB — BASIC METABOLIC PANEL
BUN: 26 mg/dL — ABNORMAL HIGH (ref 7–25)
CO2: 28 mmol/L (ref 20–31)
Calcium: 8.9 mg/dL (ref 8.6–10.3)
Chloride: 102 mmol/L (ref 98–110)
Creat: 1.68 mg/dL — ABNORMAL HIGH (ref 0.70–1.11)
Glucose, Bld: 187 mg/dL — ABNORMAL HIGH (ref 65–99)
Potassium: 4.9 mmol/L (ref 3.5–5.3)
Sodium: 138 mmol/L (ref 135–146)

## 2016-06-24 LAB — BRAIN NATRIURETIC PEPTIDE: Brain Natriuretic Peptide: 702.2 pg/mL — ABNORMAL HIGH (ref <100)

## 2016-06-24 NOTE — Telephone Encounter (Signed)
Spoke with pt's wife. She is aware of this information. Nothing further was needed.

## 2016-06-24 NOTE — Progress Notes (Addendum)
Call to wife and advised of recommendation from Chanetta Marshall, NP.  Recommended to increase Furosemide 40 mg to 2 tablets (80 mg total) am and 1 tablet pm x 3 days and then return to prescribed dosage of Furosemide 40 mg 1 tablet twice a day after 3rd day.  BMET ordered for 07/01/2016.  Repeat ICM remote transmission 06/30/2016.  She verbalized understanding.

## 2016-06-24 NOTE — Addendum Note (Signed)
Addended by: Rosalene Billings on: 06/24/2016 02:49 PM   Modules accepted: Orders

## 2016-06-24 NOTE — Progress Notes (Signed)
Patsey Berthold, NP  Rosalene Billings, RN        Double am Lasix dose for 3 days and repeat BMET next week.

## 2016-06-25 ENCOUNTER — Telehealth: Payer: Self-pay | Admitting: Pulmonary Disease

## 2016-06-25 DIAGNOSIS — G4733 Obstructive sleep apnea (adult) (pediatric): Secondary | ICD-10-CM

## 2016-06-25 DIAGNOSIS — Z9989 Dependence on other enabling machines and devices: Principal | ICD-10-CM

## 2016-06-25 DIAGNOSIS — R0602 Shortness of breath: Secondary | ICD-10-CM

## 2016-06-25 NOTE — Telephone Encounter (Signed)
Spoke with pt wife, aware of results.  Appt already scheduled for 08/16/16 with VS - per VS okay to leave as is.  Nothing further needed.

## 2016-06-25 NOTE — Procedures (Signed)
     Patient Name: [name], [name] Date: 06/24/2016 Gender: Male D.O.B: 1934-02-05 Age (years): 23 Referring Provider: Chesley Mires MD, ABSM Height (inches): 33 Interpreting Physician: Chesley Mires MD, ABSM Weight (lbs): 255 RPSGT: Laren Everts BMI: 9 MRN: [id number] Neck Size: 18.50  CLINICAL INFORMATION 82 yr old male with hx of COPD, OSA, and systolic CHF.  He is referred to have CPAP titration study. Date of NPSG: 06/14/06, AHI 35.5  SLEEP STUDY TECHNIQUE As per the AASM Manual for the Scoring of Sleep and Associated Events v2.3 (April 2016) with a hypopnea requiring 4% desaturations. The channels recorded and monitored were frontal, central and occipital EEG, electrooculogram (EOG), submentalis EMG (chin), nasal and oral airflow, thoracic and abdominal wall motion, anterior tibialis EMG, snore microphone, electrocardiogram, and pulse oximetry. Continuous positive airway pressure (CPAP) was initiated at the beginning of the study and titrated to treat sleep-disordered breathing.  MEDICATIONS Medications taken by the patient : NORCO/ VICODIN  Medications administered by patient during sleep study : No sleep medicine administered.  TECHNICIAN COMMENTS Comments added by technician: Patient was restless all through the night.  Comments added by scorer: N/A  RESPIRATORY PARAMETERS Optimal PAP Pressure (cm): 16 AHI at Optimal Pressure (/hr): 6.7 Overall Minimal O2 (%): 74.00 Supine % at Optimal Pressure (%): 0 Minimal O2 at Optimal Pressure (%): 91.0       Once he was at reasonable CPAP setting his oxygenation stabilized, and he did not require supplemental oxygen during the test.  SLEEP ARCHITECTURE The study was initiated at 10:59:52 PM and ended at 5:47:47 AM. Sleep onset time was 18.0 minutes and the sleep efficiency was 38.4%. The total sleep time was 156.5 minutes. The patient spent 24.28% of the night in stage N1 sleep, 66.77% in stage N2 sleep, 0.00% in  stage N3 and 8.95% in REM.Stage REM latency was 310.0 minutes Wake after sleep onset was 233.4. Alpha intrusion was absent. Supine sleep was 0.00%.  CARDIAC DATA The 2 lead EKG demonstrated sinus rhythm. The mean heart rate was 69.82 beats per minute. Other EKG findings include: PVCs.  LEG MOVEMENT DATA The total Periodic Limb Movements of Sleep (PLMS) were 378. The PLMS index was 144.92. A PLMS index of <15 is considered normal in adults.  IMPRESSIONS - The optimal PAP pressure was 16 cm of water. - He had a significant increase in his periodic limb movement index.  DIAGNOSIS - Obstructive Sleep Apnea (327.23 [G47.33 ICD-10])  RECOMMENDATIONS - Trial of CPAP therapy on 16 cm H2O. - He was fitted with a Medium size Fisher[employer]Paykel Full Face Mask Simplus mask.  [Electronically signed] 06/25/2016 01:45 PM  Chesley Mires MD, [place], American Board of Sleep Medicine   NPI: [id number]

## 2016-06-25 NOTE — Telephone Encounter (Signed)
Spoke with [name]-pt's wife-states patient had ONO last night and needs to know if pt should continue O2-states they were told the only change being made is the CPAP pressure. They are aware that VS is out of the office now and will await a response next week.    In the meantime patient has his O2 at home to use.

## 2016-06-25 NOTE — Telephone Encounter (Signed)
CPAP titration 06/23/16 >> CPAP 16 cm H2O >> AHI 6.7   Will have my nurse inform pt that he did better with CPAP 16 cm H2O.  I have sent order to have setting changed.  He needs ROV two months after pressure change.

## 2016-06-29 NOTE — Telephone Encounter (Signed)
He does not need to use oxygen at night anymore now that his CPAP pressure setting is increased.

## 2016-06-29 NOTE — Telephone Encounter (Signed)
VS please advise. Thanks! 

## 2016-06-29 NOTE — Telephone Encounter (Signed)
Pt aware that order will be placed to [facility] for O2 to be picked up. Nothing further needed.

## 2016-07-01 ENCOUNTER — Telehealth: Payer: Self-pay

## 2016-07-01 ENCOUNTER — Ambulatory Visit (INDEPENDENT_AMBULATORY_CARE_PROVIDER_SITE_OTHER): Payer: Medicare Other

## 2016-07-01 ENCOUNTER — Other Ambulatory Visit: Payer: Medicare Other

## 2016-07-01 DIAGNOSIS — I5022 Chronic systolic (congestive) heart failure: Secondary | ICD-10-CM

## 2016-07-01 DIAGNOSIS — Z95810 Presence of automatic (implantable) cardiac defibrillator: Secondary | ICD-10-CM

## 2016-07-01 NOTE — Progress Notes (Signed)
EPIC Encounter for ICM Monitoring  Patient Name: [name] is a 82 y.o. male Date: 07/01/2016 Primary Care Physican: Irven Shelling, MD Primary [place] Electrophysiologist: Allred Dry Weight: unknown Bi-V Pacing:  99.8%       Spoke with wife.  Heart Failure questions reviewed, pt asymptomatic for fluid symptoms.  She stated patient is having some bowel problems and did not feel like coming into the office today for lab work.  She stated they will get it drawn tomorrow.    After increase in Furosemide on 06/23/2016 x 3 days, thoracic impedance returned to normal.  Recommendations: No changes.      Follow-up plan: ICM clinic phone appointment on 07/19/2016.  Office appointment with Dr Irish Lack 07/20/2016.    Copy of ICM check sent to primary cardiologist and device physician.   ICM trend: 07/01/2016       Rosalene Billings, RN 07/01/2016 2:52 PM

## 2016-07-01 NOTE — Telephone Encounter (Signed)
Received call from wife.  She stated they were supposed to come to the office today for lab work but patient is not feeling well and will get it completed tomorrow.  Requested patient send remote transmission today and agreed to send.

## 2016-07-02 ENCOUNTER — Telehealth: Payer: Self-pay | Admitting: Cardiology

## 2016-07-02 ENCOUNTER — Other Ambulatory Visit: Payer: Medicare Other | Admitting: *Deleted

## 2016-07-02 ENCOUNTER — Telehealth: Payer: Self-pay | Admitting: Physician Assistant

## 2016-07-02 DIAGNOSIS — I5022 Chronic systolic (congestive) heart failure: Secondary | ICD-10-CM

## 2016-07-02 DIAGNOSIS — D649 Anemia, unspecified: Secondary | ICD-10-CM | POA: Diagnosis not present

## 2016-07-02 DIAGNOSIS — Z23 Encounter for immunization: Secondary | ICD-10-CM | POA: Diagnosis not present

## 2016-07-02 DIAGNOSIS — Z5181 Encounter for therapeutic drug level monitoring: Secondary | ICD-10-CM | POA: Diagnosis not present

## 2016-07-02 DIAGNOSIS — K5900 Constipation, unspecified: Secondary | ICD-10-CM | POA: Diagnosis not present

## 2016-07-02 DIAGNOSIS — E039 Hypothyroidism, unspecified: Secondary | ICD-10-CM | POA: Diagnosis not present

## 2016-07-02 LAB — BASIC METABOLIC PANEL
BUN: 23 mg/dL (ref 7–25)
CO2: 31 mmol/L (ref 20–31)
Calcium: 9.2 mg/dL (ref 8.6–10.3)
Chloride: 98 mmol/L (ref 98–110)
Creat: 1.69 mg/dL — ABNORMAL HIGH (ref 0.70–1.11)
Glucose, Bld: 36 mg/dL — CL (ref 65–99)
Potassium: 4.6 mmol/L (ref 3.5–5.3)
Sodium: 140 mmol/L (ref 135–146)

## 2016-07-02 NOTE — Telephone Encounter (Signed)
Patient's wife finally returned call.  After his appt with our clinic today he had an appt with his PCP who noted his CBG was low. He had no acute complications related to this. Wife says they rechecked it and adjusted his insulin regimen. [facility] had come out to their house to check on them as we requested and everything checked out OK.   Dayna Dunn PA-C

## 2016-07-02 NOTE — Telephone Encounter (Signed)
Received after-hours page from "Jessica/[facility]" regarding "[name] REF [id number]" "CRITICAL LABS PLEASE CALL."  I called back the number provided. The answering person did not know why I was calling and did not know who Janett Billow was, put me on hold, placed on prolonged hold with no one picking back up.  I called the number again. The second answering person also asked me why I was calling and did not know who Janett Billow was either. I was put on hold again for longed period of time, again no one came back to the phone for several minutes.   I received another page while on prolonged hold from "Terry/[facility]" again for the above reason again. I tried calling back again on a separate line and this time the answering person knew exactly who to put me through to and I spoke with Terri. Glucose was critically low at 36 at 10:27am. Otherwise BMET stable.  Called patient's mobile x3, no answer, LMOM to call back. Called patient's home numberx3, no answer, LMOM to call back urgently. As I could not reach the patient in the setting of his extremely low blood sugar, I called 911 to request patient welfare check by EMS. They took patient's information and will send someone out to his house immediately to check on him.  Will forward this information to Georgana Curio to make her aware that it was quite difficult to return the critical page successfully to [facility] (in case there have been other reports of this that need to be followed up on).  The [place] page said the labs were ordered by Chanetta Marshall NP. I do not see her involvement in the chart recently - will forward to her since pt was followed by ICM clinic, but will also forward to Tommye Standard who saw patient last as well as his primary cardiologist [name] for their information.  Britney Newstrom PA-C

## 2016-07-05 ENCOUNTER — Other Ambulatory Visit: Payer: Self-pay | Admitting: Pulmonary Disease

## 2016-07-09 ENCOUNTER — Ambulatory Visit (HOSPITAL_COMMUNITY)
Admission: RE | Admit: 2016-07-09 | Discharge: 2016-07-09 | Disposition: A | Discharge status: Home / Self Care | Payer: Medicare Other | Source: Ambulatory Visit | Attending: Nurse Practitioner | Admitting: Nurse Practitioner

## 2016-07-09 ENCOUNTER — Encounter (HOSPITAL_COMMUNITY): Payer: Self-pay | Admitting: Nurse Practitioner

## 2016-07-09 VITALS — BP 118/66 | HR 78 | Ht 68.0 in | Wt 252.6 lb

## 2016-07-09 DIAGNOSIS — L821 Other seborrheic keratosis: Secondary | ICD-10-CM | POA: Insufficient documentation

## 2016-07-09 DIAGNOSIS — Z794 Long term (current) use of insulin: Secondary | ICD-10-CM | POA: Insufficient documentation

## 2016-07-09 DIAGNOSIS — E785 Hyperlipidemia, unspecified: Secondary | ICD-10-CM | POA: Insufficient documentation

## 2016-07-09 DIAGNOSIS — I255 Ischemic cardiomyopathy: Secondary | ICD-10-CM | POA: Insufficient documentation

## 2016-07-09 DIAGNOSIS — Z7982 Long term (current) use of aspirin: Secondary | ICD-10-CM | POA: Diagnosis not present

## 2016-07-09 DIAGNOSIS — I2510 Atherosclerotic heart disease of native coronary artery without angina pectoris: Secondary | ICD-10-CM | POA: Insufficient documentation

## 2016-07-09 DIAGNOSIS — J449 Chronic obstructive pulmonary disease, unspecified: Secondary | ICD-10-CM | POA: Diagnosis not present

## 2016-07-09 DIAGNOSIS — D509 Iron deficiency anemia, unspecified: Secondary | ICD-10-CM | POA: Insufficient documentation

## 2016-07-09 DIAGNOSIS — N400 Enlarged prostate without lower urinary tract symptoms: Secondary | ICD-10-CM | POA: Diagnosis not present

## 2016-07-09 DIAGNOSIS — Z8673 Personal history of transient ischemic attack (TIA), and cerebral infarction without residual deficits: Secondary | ICD-10-CM | POA: Insufficient documentation

## 2016-07-09 DIAGNOSIS — K219 Gastro-esophageal reflux disease without esophagitis: Secondary | ICD-10-CM | POA: Insufficient documentation

## 2016-07-09 DIAGNOSIS — I420 Dilated cardiomyopathy: Secondary | ICD-10-CM | POA: Diagnosis not present

## 2016-07-09 DIAGNOSIS — G4733 Obstructive sleep apnea (adult) (pediatric): Secondary | ICD-10-CM | POA: Insufficient documentation

## 2016-07-09 DIAGNOSIS — Z87891 Personal history of nicotine dependence: Secondary | ICD-10-CM | POA: Insufficient documentation

## 2016-07-09 DIAGNOSIS — L409 Psoriasis, unspecified: Secondary | ICD-10-CM | POA: Insufficient documentation

## 2016-07-09 DIAGNOSIS — I10 Essential (primary) hypertension: Secondary | ICD-10-CM | POA: Diagnosis not present

## 2016-07-09 DIAGNOSIS — I252 Old myocardial infarction: Secondary | ICD-10-CM | POA: Insufficient documentation

## 2016-07-09 DIAGNOSIS — I739 Peripheral vascular disease, unspecified: Secondary | ICD-10-CM | POA: Diagnosis not present

## 2016-07-09 DIAGNOSIS — E039 Hypothyroidism, unspecified: Secondary | ICD-10-CM | POA: Insufficient documentation

## 2016-07-09 DIAGNOSIS — I481 Persistent atrial fibrillation: Secondary | ICD-10-CM | POA: Insufficient documentation

## 2016-07-09 DIAGNOSIS — Z7901 Long term (current) use of anticoagulants: Secondary | ICD-10-CM | POA: Diagnosis not present

## 2016-07-09 DIAGNOSIS — E119 Type 2 diabetes mellitus without complications: Secondary | ICD-10-CM | POA: Insufficient documentation

## 2016-07-09 DIAGNOSIS — I4819 Other persistent atrial fibrillation: Secondary | ICD-10-CM

## 2016-07-09 DIAGNOSIS — I480 Paroxysmal atrial fibrillation: Secondary | ICD-10-CM | POA: Diagnosis not present

## 2016-07-09 NOTE — Progress Notes (Signed)
Primary Care Physician: Irven Shelling, MD Referring Physician: Dr. Josefa Half is a 82 y.o. male with a h/o persistent Afib, BiV ICD,CAD,COPD,HTN, OSA, that is in the afib clinic for f/u s/p afib ablation 06/11/16. The pt states that he feels about the same but the wife feels that he is doing better since the procedure. He has been having constipation issues but this is close to being resolved. No swallowing or groin issues. Weight is stable.  Today, he denies symptoms of palpitations, chest pain, shortness of breath, orthopnea, PND, lower extremity edema, dizziness, presyncope, syncope, or neurologic sequela. The patient is tolerating medications without difficulties and is otherwise without complaint today.   Past Medical History:  Diagnosis Date  . Atrial fibrillation ([place])    persistent, previously seen at [facility] and placed on amiodarone  . Benign prostatic hypertrophy   . CAD (coronary artery disease)    multivessel s/p inferolateral wall MI with subsequent CABG 11/1998.  Cath 2009 with Patent grafts  . Cleft palate   . COPD with emphysema ([place]) 04/01/2010  . DM (diabetes mellitus), type 2 ([place])   . Dyspnea   . GERD (gastroesophageal reflux disease)   . HTN (hypertension)   . Hyperlipidemia   . Hypothyroidism   . Iron deficiency anemia   . Ischemic dilated cardiomyopathy    EF 35-40% by MUGA 6/11  . Myocardial infarction ([place])   . Nasal septal deviation   . Nephrolithiasis   . OSA (obstructive sleep apnea)   . PAF (paroxysmal atrial fibrillation) ([place])   . Peripheral arterial disease (HCC)    left subclavian artery stenosis  . PNA (pneumonia)   . Psoriasis   . Seborrheic keratosis   . Stroke ([place])   . Systolic congestive heart failure ([place]) 2009   s/p BiV ICD implantation by Dr Leonia Reeves (MDT)   Past Surgical History:  Procedure Laterality Date  . BI-VENTRICULAR IMPLANTABLE CARDIOVERTER DEFIBRILLATOR  (CRT-D)  10-08-08; 11-06-2013   Dr Leonia Reeves  (MDT) implant for primary prevention; gen change to MDT VivaXT CRTD by Dr Rayann Heman  . BIV ICD GENERTAOR CHANGE OUT N/A 11/06/2013   Procedure: BIV ICD GENERTAOR CHANGE OUT;  Surgeon: Coralyn Mark, MD;  Location: [facility] CATH LAB;  Service: Cardiovascular;  Laterality: N/A;  . c-spine surgery    . CARDIOVERSION N/A 04/29/2016   Procedure: CARDIOVERSION;  Surgeon: Pixie Casino, MD;  Location: [facility] ENDOSCOPY;  Service: Cardiovascular;  Laterality: N/A;  . CARPAL TUNNEL RELEASE    . CATARACT EXTRACTION    . CORONARY ARTERY BYPASS GRAFT     LIMA to LAD, SVG to OM, SVG to diagonal  . ELECTROPHYSIOLOGIC STUDY N/A 06/11/2016   Procedure: Atrial Fibrillation Ablation;  Surgeon: Thompson Grayer, MD;  Location: [place] [facility] CV LAB;  Service: Cardiovascular;  Laterality: N/A;  . left cleft palate and left cleft lip repair      Current Outpatient Prescriptions  Medication Sig Dispense Refill  . albuterol (PROVENTIL HFA;VENTOLIN HFA) 108 (90 BASE) MCG/ACT inhaler Inhale 2 puffs into the lungs every 6 (six) hours as needed for wheezing or shortness of breath. Must keep June 2016 appt. 1 Inhaler 0  . albuterol (PROVENTIL) (2.5 MG/3ML) 0.083% nebulizer solution USE 1 VIAL VIA NEBULIZER 4 TIMES A DAY AS DIRECTED 300 mL 2  . amiodarone (PACERONE) 400 MG tablet Take 0.5 tablets (200 mg total) by mouth daily. (Patient taking differently: Take 400 mg by mouth daily. ) 30 tablet 6  .  aspirin 81 MG tablet Take 81 mg by mouth daily.      [name] atorvastatin (LIPITOR) 80 MG tablet TAKE 1 TABLET (80 MG TOTAL) BY MOUTH DAILY. 90 tablet 2  . carvedilol (COREG) 6.25 MG tablet TAKE 1 TABLET BY MOUTH TWICE A DAY 180 tablet 2  . DULERA 100-5 MCG/ACT AERO TAKE 2 PUFFS BY MOUTH TWICE A DAY 1 Inhaler 6  . ezetimibe (ZETIA) 10 MG tablet Take 10 mg by mouth daily.    . fluticasone (FLONASE) 50 MCG/ACT nasal spray Place 2 sprays into both nostrils daily. 16 g 2  . furosemide (LASIX) 40 MG tablet Take 40 mg by mouth 2 (two) times daily.    [name]  HYDROcodone-acetaminophen (NORCO/VICODIN) 5-325 MG per tablet Take 1 tablet by mouth daily as needed for moderate pain.     [name] insulin aspart (NOVOLOG) 100 UNIT/ML injection Inject 18 units into the skin before breakfast and lunch, inject 20 units into the skin before supper    . levothyroxine (SYNTHROID, LEVOTHROID) 100 MCG tablet Take 100 mcg by mouth daily before breakfast.     . Multiple Vitamin (MULTIVITAMIN) tablet Take 1 tablet by mouth daily.      [name] omeprazole (PRILOSEC) 20 MG capsule Take 20 mg by mouth daily.      [name] senna-docusate (SENOKOT-S) 8.6-50 MG tablet Take 2 tablets by mouth at bedtime. 30 tablet 0  . spironolactone (ALDACTONE) 25 MG tablet TAKE 1/2 TABLET (12.5 MG TOTAL) BY MOUTH DAILY. 45 tablet 10  . Tiotropium Bromide Monohydrate (SPIRIVA RESPIMAT) 2.5 MCG/ACT AERS Inhale 2 puffs into the lungs daily. 1 Inhaler 5  . TRESIBA FLEXTOUCH 100 UNIT/ML SOPN Inject 70 Units into the skin daily. MORNINGS  3  . vitamin B-12 (CYANOCOBALAMIN) 1000 MCG tablet Take 1,000 mcg by mouth daily.    [name] warfarin (COUMADIN) 5 MG tablet Take 5 mg by mouth daily.     No current facility-administered medications for this encounter.     No Known Allergies  Social History   Social History  . Marital status: Married    Spouse name: N/A  . Number of children: N/A  . Years of education: N/A   Occupational History  . retired     Engineer, agricultural   Social History Main Topics  . Smoking status: Former Smoker    Packs/day: 3.00    Years: 65.00    Types: Cigarettes    Quit date: 10/25/1998  . Smokeless tobacco: Never Used  . Alcohol use No     Comment: remote history of heavy alcohol use  . Drug use: No  . Sexual activity: Not on file   Other Topics Concern  . Not on file   Social History Narrative   Lives [place]   Retired    Family History  Problem Relation Age of Onset  . Asthma Father   . Stroke Father   . Hypertension Father   . Hypertension Mother   . Heart disease  Brother   . Heart attack Brother   . Hypertension Sister   . Hypertension Brother     ROS- All systems are reviewed and negative except as per the HPI above  Physical Exam: Vitals:   07/09/16 0841  BP: 118/66  Pulse: 78  Weight: 252 lb 9.6 oz (114.6 kg)  Height: 5\' 8"  (1.727 m)    GEN- The patient is well appearing, alert and oriented x 3 today.   Head- normocephalic, atraumatic Eyes-  Sclera clear, conjunctiva pink Ears- hearing  intact Oropharynx- clear Neck- supple, no JVP Lymph- no cervical lymphadenopathy Lungs- Clear to ausculation bilaterally, normal work of breathing Heart- Regular rate and rhythm, no murmurs, rubs or gallops, PMI not laterally displaced GI-  Large, obese,soft, NT, ND, + BS Extremities- no clubbing, cyanosis, or edema MS- no significant deformity or atrophy Skin- no rash or lesion Psych- euthymic mood, full affect Neuro- strength and sensation are intact  EKG-AV dual  paced rhythm at 78 bpm Epic records reviewed    Assessment and Plan: 1. Persistent afib  S/p afib ablation  Decrease amiodarone to  200 mg a day as per D/C instructions, he has been taking 400 mg daily Continue carvedilol Continue warfarin  2. PPM Due for remote transmission 9/25  F/u with [name] 9/26 F/u with pulmonology 10/23 F/u with Dr. Rayann Heman 11/13  Butch Penny C. Kristle Wesch, [facility] [address] [place], [place] [address] [phone]

## 2016-07-10 ENCOUNTER — Other Ambulatory Visit: Payer: Self-pay | Admitting: Interventional Cardiology

## 2016-07-12 ENCOUNTER — Ambulatory Visit (INDEPENDENT_AMBULATORY_CARE_PROVIDER_SITE_OTHER): Payer: Medicare Other | Admitting: *Deleted

## 2016-07-12 ENCOUNTER — Other Ambulatory Visit: Payer: Self-pay | Admitting: Internal Medicine

## 2016-07-12 ENCOUNTER — Ambulatory Visit
Admission: RE | Admit: 2016-07-12 | Discharge: 2016-07-12 | Disposition: A | Discharge status: Home / Self Care | Payer: Medicare Other | Source: Ambulatory Visit | Attending: Internal Medicine | Admitting: Internal Medicine

## 2016-07-12 DIAGNOSIS — I4891 Unspecified atrial fibrillation: Secondary | ICD-10-CM

## 2016-07-12 DIAGNOSIS — Z7901 Long term (current) use of anticoagulants: Secondary | ICD-10-CM | POA: Diagnosis not present

## 2016-07-12 DIAGNOSIS — I480 Paroxysmal atrial fibrillation: Secondary | ICD-10-CM | POA: Diagnosis not present

## 2016-07-12 DIAGNOSIS — R1084 Generalized abdominal pain: Secondary | ICD-10-CM

## 2016-07-12 DIAGNOSIS — K5900 Constipation, unspecified: Secondary | ICD-10-CM | POA: Diagnosis not present

## 2016-07-12 LAB — POCT INR: INR: 2.7

## 2016-07-13 DIAGNOSIS — K5900 Constipation, unspecified: Secondary | ICD-10-CM | POA: Diagnosis not present

## 2016-07-13 DIAGNOSIS — R1032 Left lower quadrant pain: Secondary | ICD-10-CM | POA: Diagnosis not present

## 2016-07-14 ENCOUNTER — Other Ambulatory Visit: Payer: Self-pay | Admitting: Internal Medicine

## 2016-07-14 DIAGNOSIS — K5900 Constipation, unspecified: Secondary | ICD-10-CM

## 2016-07-14 DIAGNOSIS — R1032 Left lower quadrant pain: Secondary | ICD-10-CM

## 2016-07-15 ENCOUNTER — Ambulatory Visit
Admission: RE | Admit: 2016-07-15 | Discharge: 2016-07-15 | Disposition: A | Discharge status: Home / Self Care | Payer: Medicare Other | Source: Ambulatory Visit | Attending: Internal Medicine | Admitting: Internal Medicine

## 2016-07-15 DIAGNOSIS — R1032 Left lower quadrant pain: Secondary | ICD-10-CM

## 2016-07-15 DIAGNOSIS — K5900 Constipation, unspecified: Secondary | ICD-10-CM | POA: Diagnosis not present

## 2016-07-19 ENCOUNTER — Ambulatory Visit (INDEPENDENT_AMBULATORY_CARE_PROVIDER_SITE_OTHER): Payer: Medicare Other

## 2016-07-19 ENCOUNTER — Encounter: Payer: Self-pay | Admitting: Pulmonary Disease

## 2016-07-19 DIAGNOSIS — Z95810 Presence of automatic (implantable) cardiac defibrillator: Secondary | ICD-10-CM

## 2016-07-19 DIAGNOSIS — I5022 Chronic systolic (congestive) heart failure: Secondary | ICD-10-CM

## 2016-07-19 DIAGNOSIS — G4733 Obstructive sleep apnea (adult) (pediatric): Secondary | ICD-10-CM | POA: Diagnosis not present

## 2016-07-19 DIAGNOSIS — J449 Chronic obstructive pulmonary disease, unspecified: Secondary | ICD-10-CM | POA: Diagnosis not present

## 2016-07-19 NOTE — Progress Notes (Signed)
EPIC Encounter for ICM Monitoring  Patient Name: [name] is a 82 y.o. male Date: 07/19/2016 Primary Care Physican: Irven Shelling, MD Primary [place] Electrophysiologist: Allred Dry Weight:    unknown Bi-V Pacing:  99.4%           Spoke with wife.  Heart Failure questions reviewed, pt asymptomatic for fluid symptoms.  Patient has been having problems with bowel blockage and is working with physician to resolve that and some side pain.     Thoracic impedance normal   Recommendations:  No changes.      Follow-up plan: ICM clinic phone appointment on 08/19/2016.  Office appointment with Dr Irish Lack on 07/20/2016.  Copy of ICM check sent to primary cardiologist and device physician.   ICM trend: 07/19/2016       Rosalene Billings, RN 07/19/2016 3:49 PM

## 2016-07-20 ENCOUNTER — Ambulatory Visit (INDEPENDENT_AMBULATORY_CARE_PROVIDER_SITE_OTHER): Payer: Medicare Other | Admitting: Interventional Cardiology

## 2016-07-20 ENCOUNTER — Encounter: Payer: Self-pay | Admitting: Interventional Cardiology

## 2016-07-20 VITALS — BP 110/70 | HR 70 | Ht 68.0 in | Wt 249.0 lb

## 2016-07-20 DIAGNOSIS — I480 Paroxysmal atrial fibrillation: Secondary | ICD-10-CM | POA: Diagnosis not present

## 2016-07-20 DIAGNOSIS — R0602 Shortness of breath: Secondary | ICD-10-CM | POA: Diagnosis not present

## 2016-07-20 DIAGNOSIS — I10 Essential (primary) hypertension: Secondary | ICD-10-CM | POA: Diagnosis not present

## 2016-07-20 DIAGNOSIS — I255 Ischemic cardiomyopathy: Secondary | ICD-10-CM

## 2016-07-20 DIAGNOSIS — Z7901 Long term (current) use of anticoagulants: Secondary | ICD-10-CM | POA: Diagnosis not present

## 2016-07-20 DIAGNOSIS — I2510 Atherosclerotic heart disease of native coronary artery without angina pectoris: Secondary | ICD-10-CM

## 2016-07-20 NOTE — Progress Notes (Signed)
Cardiology Office Note   Date:  07/20/2016   ID:  [name], DOB 08/06/1934, MRN [id number]  PCP:  Irven Shelling, MD    No chief complaint on file. CAD   Wt Readings from Last 3 Encounters:  07/20/16 249 lb (112.9 kg)  07/09/16 252 lb 9.6 oz (114.6 kg)  06/24/16 255 lb (115.7 kg)       History of Present Illness: [name] is a 82 y.o. male  Who has a history of COPD on CPAP, left subclavian stenosis, CHF ( systolic, EF [id number] in [id number]), Afib s/p biventricular defibrillator, CAD s/p CABG, hypothyroidism, HTN, CVA.  Admitted 01/16/16 with acute resp. Failure with hypoxia acute COPD exacerbation RLL PNA, and acute systolic HF.Chronic systolic CHF: EF 123456 -Weight stable/decreasing  Paroxsymal Afib At discharge in 3/17: - Currently in AV paced rhythm on amiodarone. - CHADSVASC of 8, A/C with warfarin, theraputic AoCKD3- improving - ARB held,  -Resumed home oral lasix 40mg  BID  He failed attempts at restoring NSR with Dr. Rayann Heman.  Ablation was done- see below.  ? Raised by EP whether he still needs aspirin.     Most recent issue has been bowel obstruction.  He had some type of colon cleanse with relief.  He is back to eating regular foods.  He had a BM this AM.    He has lost 15 lbs in the past few months through portion control.   He had an AFib ablation and flutter ablation in 8/17.    Past Medical History:  Diagnosis Date  . Atrial fibrillation ([place])    persistent, previously seen at [facility] and placed on amiodarone  . Benign prostatic hypertrophy   . CAD (coronary artery disease)    multivessel s/p inferolateral wall MI with subsequent CABG 11/1998.  Cath 2009 with Patent grafts  . Cleft palate   . COPD with emphysema ([place]) 04/01/2010  . DM (diabetes mellitus), type 2 ([place])   . Dyspnea   . GERD (gastroesophageal reflux disease)   . HTN (hypertension)   . Hyperlipidemia   . Hypothyroidism   . Iron deficiency anemia   . Ischemic dilated  cardiomyopathy    EF 35-40% by MUGA 6/11  . Myocardial infarction ([place])   . Nasal septal deviation   . Nephrolithiasis   . OSA (obstructive sleep apnea)   . PAF (paroxysmal atrial fibrillation) ([place])   . Peripheral arterial disease (HCC)    left subclavian artery stenosis  . PNA (pneumonia)   . Psoriasis   . Seborrheic keratosis   . Stroke ([place])   . Systolic congestive heart failure ([place]) 2009   s/p BiV ICD implantation by Dr Leonia Reeves (MDT)    Past Surgical History:  Procedure Laterality Date  . BI-VENTRICULAR IMPLANTABLE CARDIOVERTER DEFIBRILLATOR  (CRT-D)  10-08-08; 11-06-2013   Dr Leonia Reeves (MDT) implant for primary prevention; gen change to MDT VivaXT CRTD by Dr Rayann Heman  . BIV ICD GENERTAOR CHANGE OUT N/A 11/06/2013   Procedure: BIV ICD GENERTAOR CHANGE OUT;  Surgeon: Coralyn Mark, MD;  Location: [facility] CATH LAB;  Service: Cardiovascular;  Laterality: N/A;  . c-spine surgery    . CARDIOVERSION N/A 04/29/2016   Procedure: CARDIOVERSION;  Surgeon: Pixie Casino, MD;  Location: [facility] ENDOSCOPY;  Service: Cardiovascular;  Laterality: N/A;  . CARPAL TUNNEL RELEASE    . CATARACT EXTRACTION    . CORONARY ARTERY BYPASS GRAFT     LIMA to LAD, SVG to OM, SVG to diagonal  .  ELECTROPHYSIOLOGIC STUDY N/A 06/11/2016   Procedure: Atrial Fibrillation Ablation;  Surgeon: Thompson Grayer, MD;  Location: [place] CV LAB;  Service: Cardiovascular;  Laterality: N/A;  . left cleft palate and left cleft lip repair       Current Outpatient Prescriptions  Medication Sig Dispense Refill  . albuterol (PROVENTIL HFA;VENTOLIN HFA) 108 (90 BASE) MCG/ACT inhaler Inhale 2 puffs into the lungs every 6 (six) hours as needed for wheezing or shortness of breath. Must keep June 2016 appt. 1 Inhaler 0  . albuterol (PROVENTIL) (2.5 MG/3ML) 0.083% nebulizer solution USE 1 VIAL VIA NEBULIZER 4 TIMES A DAY AS DIRECTED 300 mL 2  . amiodarone (PACERONE) 400 MG tablet Take 200 mg by mouth daily.    [name] aspirin 81 MG tablet Take  81 mg by mouth daily.      [name] atorvastatin (LIPITOR) 80 MG tablet TAKE 1 TABLET (80 MG TOTAL) BY MOUTH DAILY. 90 tablet 2  . carvedilol (COREG) 6.25 MG tablet TAKE 1 TABLET BY MOUTH TWICE A DAY 180 tablet 2  . DULERA 100-5 MCG/ACT AERO TAKE 2 PUFFS BY MOUTH TWICE A DAY 1 Inhaler 6  . ezetimibe (ZETIA) 10 MG tablet Take 10 mg by mouth daily.    . fluticasone (FLONASE) 50 MCG/ACT nasal spray Place 2 sprays into both nostrils daily. 16 g 2  . furosemide (LASIX) 40 MG tablet Take 40 mg by mouth 2 (two) times daily.    [name] HYDROcodone-acetaminophen (NORCO/VICODIN) 5-325 MG per tablet Take 1 tablet by mouth daily as needed for moderate pain.     [name] insulin aspart (NOVOLOG) 100 UNIT/ML injection Inject 6 units into the skin before breakfast and lunch, inject 6  units into the skin before supper 6 units    . levothyroxine (SYNTHROID, LEVOTHROID) 100 MCG tablet Take 100 mcg by mouth daily before breakfast.     . Multiple Vitamin (MULTIVITAMIN) tablet Take 1 tablet by mouth daily.      [name] omeprazole (PRILOSEC) 20 MG capsule Take 20 mg by mouth daily.      [name] senna (SENOKOT) 8.6 MG tablet Take 2 tablets by mouth at bedtime. 2 tabs in the morning    . spironolactone (ALDACTONE) 25 MG tablet TAKE 1/2 TABLET (12.5 MG TOTAL) BY MOUTH DAILY. 45 tablet 1  . Tiotropium Bromide Monohydrate (SPIRIVA RESPIMAT) 2.5 MCG/ACT AERS Inhale 2 puffs into the lungs daily. 1 Inhaler 5  . TRESIBA FLEXTOUCH 100 UNIT/ML SOPN Inject 50 Units into the skin daily. MORNINGS  3  . vitamin B-12 (CYANOCOBALAMIN) 1000 MCG tablet Take 1,000 mcg by mouth daily.    [name] warfarin (COUMADIN) 5 MG tablet Take 5 mg by mouth daily.     No current facility-administered medications for this visit.     Allergies:   Review of patient's allergies indicates no known allergies.    Social History:  The patient  reports that he quit smoking about 17 years ago. His smoking use included Cigarettes. He has a 195.00 pack-year smoking history. He has never used  smokeless tobacco. He reports that he does not drink alcohol or use drugs.   Family History:  The patient's family history includes Asthma in his father; Heart attack in his brother; Heart disease in his brother; Hypertension in his brother, father, mother, and sister; Stroke in his father.    ROS:  Please see the history of present illness.   Otherwise, review of systems are positive for DOE.   All other systems are reviewed and negative.  PHYSICAL EXAM: VS:  BP 110/70   Pulse 70   Ht 5\' 8"  (1.727 m)   Wt 249 lb (112.9 kg)   SpO2 90%   BMI 37.86 kg/m  , BMI Body mass index is 37.86 kg/m. GEN: Well nourished, well developed, in no acute distress  HEENT: normal  Neck: no JVD, carotid bruits, or masses Cardiac: RRR; no murmurs, rubs, or gallops,no edema , mildly decreased left radial pulse Respiratory:  clear to auscultation bilaterally, normal work of breathing GI: soft, nontender, nondistended, + BS MS: no deformity or atrophy  Skin: warm and dry, no rash Neuro:  Strength and sensation are intact Psych: euthymic mood, full affect   Recent Labs: 04/21/2016: ALT 16; TSH 2.14 06/02/2016: Hemoglobin 10.4; Platelets 260 06/23/2016: Brain Natriuretic Peptide 702.2 07/02/2016: BUN 23; Creat 1.69; Potassium 4.6; Sodium 140   Lipid Panel    Component Value Date/Time   CHOL 137 11/12/2014 0921   TRIG 95 11/12/2014 0921   HDL 50 11/12/2014 0921   CHOLHDL 2.6 11/29/2008 0330   VLDL 10 11/29/2008 0330   LDLCALC 68 11/12/2014 0921     Other studies Reviewed: Additional studies/ records that were reviewed today with results demonstrating: AFib ablation records.   ASSESSMENT AND PLAN:  1. CAD: No angina at this time. Continue aggressive secondary prevention. Known left subclavian stenosis with a LIMA present. No anginal symptoms. He has been evaluated by Dr. Gwenlyn Found in the past.  OK to increase exercise gradually.  He uses an exercise bike at home.  2. Hyperlipidemia: LDL controlled in  2016.  Followed with PMD. 3. HTN: Well controlled. Continue current medicines. 4. Edema: Continue to have LE edema.  Elevate legs and continue diuretics. 5. SHOB: Likely related to COPD. Watch for fluid overload given chronic systolic dysfunction/ischemic cardiomyopathy. 6. Continue with routine defibrillator checks., Dr. Caryl Comes Dr. Rayann Heman. 7. Paroxysmal Atrial fibrillation: Coumadin for stroke prevention.  S/p Fib/fluttter ablation.  OK to continue aspirin for now given PAD and CAD.  If any bleeding issues arise, would stop aspirin.   Current medicines are reviewed at length with the patient today.  The patient concerns regarding his medicines were addressed.  The following changes have been made:  No change  Labs/ tests ordered today include:  No orders of the defined types were placed in this encounter.   Recommend 150 minutes/week of aerobic exercise Low fat, low carb, high fiber diet recommended  Disposition:   FU in 9 months   Signed, Larae Grooms, MD  07/20/2016 9:16 AM    [facility] [place], [place], [place]  [address] Phone: [phone]; Fax: [phone]

## 2016-07-20 NOTE — Patient Instructions (Signed)
**Note De-identified Dyer Klug Obfuscation** Medication Instructions:  Same-no changes  Labwork: None  Testing/Procedures: None  Follow-Up: Your physician wants you to follow-up in: 9 months. You will receive a reminder letter in the mail two months in advance. If you don't receive a letter, please call our office to schedule the follow-up appointment.     If you need a refill on your cardiac medications before your next appointment, please call your pharmacy.   

## 2016-07-21 DIAGNOSIS — I70293 Other atherosclerosis of native arteries of extremities, bilateral legs: Secondary | ICD-10-CM | POA: Diagnosis not present

## 2016-07-21 DIAGNOSIS — L84 Corns and callosities: Secondary | ICD-10-CM | POA: Diagnosis not present

## 2016-07-21 DIAGNOSIS — E1351 Other specified diabetes mellitus with diabetic peripheral angiopathy without gangrene: Secondary | ICD-10-CM | POA: Diagnosis not present

## 2016-07-21 DIAGNOSIS — L602 Onychogryphosis: Secondary | ICD-10-CM | POA: Diagnosis not present

## 2016-07-22 ENCOUNTER — Other Ambulatory Visit: Payer: Self-pay | Admitting: Internal Medicine

## 2016-07-22 ENCOUNTER — Ambulatory Visit
Admission: RE | Admit: 2016-07-22 | Discharge: 2016-07-22 | Disposition: A | Discharge status: Home / Self Care | Payer: Medicare Other | Source: Ambulatory Visit | Attending: Internal Medicine | Admitting: Internal Medicine

## 2016-07-22 DIAGNOSIS — Z7984 Long term (current) use of oral hypoglycemic drugs: Secondary | ICD-10-CM | POA: Diagnosis not present

## 2016-07-22 DIAGNOSIS — M5442 Lumbago with sciatica, left side: Secondary | ICD-10-CM | POA: Diagnosis not present

## 2016-07-22 DIAGNOSIS — E1151 Type 2 diabetes mellitus with diabetic peripheral angiopathy without gangrene: Secondary | ICD-10-CM | POA: Diagnosis not present

## 2016-07-22 DIAGNOSIS — M47816 Spondylosis without myelopathy or radiculopathy, lumbar region: Secondary | ICD-10-CM | POA: Diagnosis not present

## 2016-07-22 DIAGNOSIS — E1122 Type 2 diabetes mellitus with diabetic chronic kidney disease: Secondary | ICD-10-CM | POA: Diagnosis not present

## 2016-07-22 DIAGNOSIS — M5440 Lumbago with sciatica, unspecified side: Secondary | ICD-10-CM

## 2016-07-22 DIAGNOSIS — K5900 Constipation, unspecified: Secondary | ICD-10-CM | POA: Diagnosis not present

## 2016-07-22 DIAGNOSIS — I10 Essential (primary) hypertension: Secondary | ICD-10-CM | POA: Diagnosis not present

## 2016-07-26 DIAGNOSIS — L089 Local infection of the skin and subcutaneous tissue, unspecified: Secondary | ICD-10-CM | POA: Diagnosis not present

## 2016-07-26 DIAGNOSIS — B9689 Other specified bacterial agents as the cause of diseases classified elsewhere: Secondary | ICD-10-CM | POA: Diagnosis not present

## 2016-07-30 ENCOUNTER — Ambulatory Visit (INDEPENDENT_AMBULATORY_CARE_PROVIDER_SITE_OTHER): Payer: Medicare Other | Admitting: *Deleted

## 2016-07-30 DIAGNOSIS — I480 Paroxysmal atrial fibrillation: Secondary | ICD-10-CM

## 2016-07-30 DIAGNOSIS — I4891 Unspecified atrial fibrillation: Secondary | ICD-10-CM

## 2016-07-30 DIAGNOSIS — Z7901 Long term (current) use of anticoagulants: Secondary | ICD-10-CM

## 2016-07-30 LAB — POCT INR: INR: 3.1

## 2016-08-04 ENCOUNTER — Encounter: Payer: Self-pay | Admitting: Acute Care

## 2016-08-04 ENCOUNTER — Ambulatory Visit (INDEPENDENT_AMBULATORY_CARE_PROVIDER_SITE_OTHER): Payer: Medicare Other | Admitting: Acute Care

## 2016-08-04 ENCOUNTER — Telehealth: Payer: Self-pay | Admitting: Acute Care

## 2016-08-04 VITALS — BP 102/70 | HR 78 | Ht 68.0 in | Wt 241.8 lb

## 2016-08-04 DIAGNOSIS — G4733 Obstructive sleep apnea (adult) (pediatric): Secondary | ICD-10-CM

## 2016-08-04 DIAGNOSIS — I255 Ischemic cardiomyopathy: Secondary | ICD-10-CM | POA: Diagnosis not present

## 2016-08-04 NOTE — Progress Notes (Signed)
Reviewed and agree with assessment/plan.  Chesley Mires, MD [facility] Pulmonary/Critical Care 08/04/2016, 2:02 PM Pager:  [phone]

## 2016-08-04 NOTE — Telephone Encounter (Signed)
Pt dropped off SD card for download - card downloaded and print out will be given to Judson Roch this afternoon.  Will send to Judson Roch as Juluis Rainier.

## 2016-08-04 NOTE — Telephone Encounter (Signed)
I have reviewed the results of the down load and I have called the patients wife with results and plan of care. She verbalized understanding.

## 2016-08-04 NOTE — Patient Instructions (Addendum)
It is great to see you today. Bring SIM card to the office for a download today or tomorrow. We will place an order for a mask fitting with [facility]. Continue your Dulera 2 puffs twice daily Continue your Respimat 2 puffs every morning. Continue your Albuterol nebs as you have been doing. Continue on CPAP at bedtime. You appear to be benefiting from the treatment Goal is to wear for at least 6 hours each night for maximal clinical benefit. Continue to work on weight loss, as the link between excess weight  and sleep apnea is well established.  Do not drive if sleepy. Follow up with Dr. Halford Chessman 10/23 as is already scheduled.  Please contact office for sooner follow up if symptoms do not improve or worsen or seek emergency care

## 2016-08-04 NOTE — Progress Notes (Signed)
History of Present Illness [name] is an 82 y.o. male former smoker with COPD/ Emphysema and OSA followed by Dr. Halford Chessman.He is here for follow up of pressure changes to CPAP therapy  after CPAP titration study done  06/23/16  08/04/2016 Follow Up for CPAP:  Pt. Presents today for follow up after  CPAP pressure changes to a set pressure of 16 cm H2O after titration study done 06/23/2016. He did not bring his SIM card with him to the office today, but will bring it this afternoon for review.He states he has been wearing his CPAP device daily with the exception of 2 nights when he was sick with a bowel blockage .He has lost 23 pounds over the last 2 months due to this bowel issue and a loss of appetite.[name] KitchenHe does continue to have some day time sleepiness.We discussed that he needs to be wearing his device for at least 6 hours per night. He stated that he has had trouble with a mask leak, and for that reason often does not wear it for long periods of time. He is having to wear the mask so tightly it is uncomfortable. We will send him for a mask fitting to see if we can establish  a better fit. I have asked that nasal pillows be considered. He is using his albuterol nebs several times daily and is compliant with his Dulera and Spiriva Respimat. He denies chest pain, fever, orthopnea, hemoptysis, leg or calf pain.  Tests:  Down Load:07/02/2016-08/03/2016  CPAP 16 cm H2O Usage:  31/33 days ( 94%) >4 hours: 21 days ( 64%) <4 hours : 10 days ( 30%) AHI: 16.3 Leaks: Median: 9.5 /  95th percentile : 39.8; /  Max: 122.3   Sleep tests PSG 06/14/06>>AHI 35.5  CPAP titration 06/23/16 >> CPAP 16 cm H2O >> AHI 6.7  Pulmonary tests PFT 03/30/11>>FEV1 1.98(72%), FEV1% 68, DLCO 64%  PFT 04/19/12>>FEV1 1.65 (67%), FEV1% 63, TLC 5.66 (99%), DLCO 71%, no BD. PFT 12/19/13 >> FEV1 2.03 (77%), FEV1% 72, TLC 6.45 (98%), DLCO 49% CT chest 01/18/14 >> mild centrilobular and paraseptal emphysema, mild fibrotic  changes Rt periphery CT chest 01/15/16 >> emphysema  Cardiac tests Echo 01/11/16 >> EF 35 to 40%, mild LVH, grade 2 diastolic CHF, mod/severe LA dilation, PAS 23 mmHg   Past medical hx Past Medical History:  Diagnosis Date  . Atrial fibrillation ([place])    persistent, previously seen at [facility] and placed on amiodarone  . Benign prostatic hypertrophy   . CAD (coronary artery disease)    multivessel s/p inferolateral wall MI with subsequent CABG 11/1998.  Cath 2009 with Patent grafts  . Cleft palate   . COPD with emphysema ([place]) 04/01/2010  . DM (diabetes mellitus), type 2 ([place])   . Dyspnea   . GERD (gastroesophageal reflux disease)   . HTN (hypertension)   . Hyperlipidemia   . Hypothyroidism   . Iron deficiency anemia   . Ischemic dilated cardiomyopathy ([place])    EF 35-40% by MUGA 6/11  . Myocardial infarction   . Nasal septal deviation   . Nephrolithiasis   . OSA (obstructive sleep apnea)   . PAF (paroxysmal atrial fibrillation) ([place])   . Peripheral arterial disease (HCC)    left subclavian artery stenosis  . PNA (pneumonia)   . Psoriasis   . Seborrheic keratosis   . Stroke ([place])   . Systolic congestive heart failure ([place]) 2009   s/p BiV ICD implantation by Dr Leonia Reeves ([facility])  Past surgical hx, Family hx, Social hx all reviewed.  Current Outpatient Prescriptions on File Prior to Visit  Medication Sig  . albuterol (PROVENTIL HFA;VENTOLIN HFA) 108 (90 BASE) MCG/ACT inhaler Inhale 2 puffs into the lungs every 6 (six) hours as needed for wheezing or shortness of breath. Must keep June 2016 appt.  [name] albuterol (PROVENTIL) (2.5 MG/3ML) 0.083% nebulizer solution USE 1 VIAL VIA NEBULIZER 4 TIMES A DAY AS DIRECTED  . aspirin 81 MG tablet Take 81 mg by mouth daily.    [name] atorvastatin (LIPITOR) 80 MG tablet TAKE 1 TABLET (80 MG TOTAL) BY MOUTH DAILY.  . carvedilol (COREG) 6.25 MG tablet TAKE 1 TABLET BY MOUTH TWICE A DAY  . DULERA 100-5 MCG/ACT AERO TAKE 2 PUFFS BY MOUTH TWICE A DAY  .  ezetimibe (ZETIA) 10 MG tablet Take 10 mg by mouth daily.  . fluticasone (FLONASE) 50 MCG/ACT nasal spray Place 2 sprays into both nostrils daily.  . furosemide (LASIX) 40 MG tablet Take 40 mg by mouth 2 (two) times daily.  . insulin aspart (NOVOLOG) 100 UNIT/ML injection Inject 6 units into the skin before breakfast and lunch, inject 6  units into the skin before supper 6 units  . levothyroxine (SYNTHROID, LEVOTHROID) 100 MCG tablet Take 100 mcg by mouth daily before breakfast.   . Multiple Vitamin (MULTIVITAMIN) tablet Take 1 tablet by mouth daily.    [name] omeprazole (PRILOSEC) 20 MG capsule Take 20 mg by mouth daily.    [name] senna (SENOKOT) 8.6 MG tablet Take 2 tablets by mouth at bedtime. 2 tabs in the morning  . spironolactone (ALDACTONE) 25 MG tablet TAKE 1/2 TABLET (12.5 MG TOTAL) BY MOUTH DAILY.  [name] Tiotropium Bromide Monohydrate (SPIRIVA RESPIMAT) 2.5 MCG/ACT AERS Inhale 2 puffs into the lungs daily.  [name] FLEXTOUCH 100 UNIT/ML SOPN Inject 50 Units into the skin daily. MORNINGS  . vitamin B-12 (CYANOCOBALAMIN) 1000 MCG tablet Take 1,000 mcg by mouth daily.  [name] warfarin (COUMADIN) 5 MG tablet Take 5 mg by mouth daily.   No current facility-administered medications on file prior to visit.      No Known Allergies  Review Of Systems:  Constitutional:   +  weight loss, No  night sweats,  Fevers, chills, fatigue, or  lassitude.  HEENT:   No headaches,  Difficulty swallowing,  Tooth/dental problems, or  Sore throat,                No sneezing, itching, ear ache, nasal congestion, post nasal drip,   CV:  No chest pain,  Orthopnea, PND, swelling in lower extremities, anasarca, dizziness, palpitations, syncope.   GI  No heartburn, indigestion, abdominal pain, nausea, vomiting, diarrhea, change in bowel habits, loss of appetite, bloody stools.   Resp: + shortness of breath with exertion less at rest.  No excess mucus, no productive cough,  No non-productive cough,  No coughing up of blood.   No change in color of mucus.  + wheezing.  No chest wall deformity  Skin: no rash or lesions.  GU: no dysuria, change in color of urine, no urgency or frequency.  No flank pain, no hematuria   MS:  No joint pain or swelling.  No decreased range of motion.  No back pain.  Psych:  No change in mood or affect. No depression or anxiety.  No memory loss.   Vital Signs BP 102/70 (BP Location: Right Arm, Cuff Size: Normal)   Pulse 78   Ht 5\' 8"  (1.727 m)  Wt 241 lb 12.8 oz (109.7 kg)   SpO2 96%   BMI 36.77 kg/m    Physical Exam:  General- No distress,  A&Ox3, obese ENT: No sinus tenderness, TM clear, pale nasal mucosa, no oral exudate,no post nasal drip, no LAN Cardiac: S1, S2, regular rate and rhythm, no murmur Chest: + wheeze/no  rales/ dullness; no accessory muscle use, no nasal flaring, no sternal retractions Abd.: Soft Non-tender Ext: No clubbing cyanosis, edema Neuro:  normal strength Skin: No rashes, warm and dry Psych: normal mood and behavior   Assessment/Plan  OSA (obstructive sleep apnea) Pt. Is compliant with CPAP He appears to be benefiting from treatment Significant leak and AHI of 16.3. Has lost 23 pounds due to a bowel obstruction issue which is resolving. Plan: Order placed for mask fitting Educated on wearing the CPAP device for at least 6 hours each night. Continue to work on weight loss, as the link between excess weight  and sleep apnea is well established.  Do not drive if sleepy. Reminded to bring [facility] card for OSA visits as he does not have a wireless device. Follow up as scheduled/ or as needed for any problems   COPD with Emphysema: Stable interval Plan: Continue Spiriva respimat Continue dulera and as needed albuterol nebs  Magdalen Spatz, NP 08/04/2016  1:45 PM

## 2016-08-04 NOTE — Assessment & Plan Note (Addendum)
Pt. Is compliant with CPAP He appears to be benefiting from treatment Significant leak and AHI of 16.3. Has lost 23 pounds due to a bowel obstruction issue which is resolving. Plan: Order placed for mask fitting Educated on wearing the CPAP device for at least 6 hours each night. Continue to work on weight loss, as the link between excess weight  and sleep apnea is well established.  Do not drive if sleepy. Reminded to bring [facility] card for OSA visits as he does not have a wireless device. Follow up as scheduled/ or as needed for any problems

## 2016-08-07 ENCOUNTER — Other Ambulatory Visit: Payer: Self-pay | Admitting: Cardiology

## 2016-08-09 ENCOUNTER — Telehealth: Payer: Self-pay | Admitting: Pulmonary Disease

## 2016-08-09 NOTE — Telephone Encounter (Signed)
Patient notified and states that she has received call from [facility] and scheduled to go in tomorrow for fitting. Nothing further needed.

## 2016-08-09 NOTE — Telephone Encounter (Signed)
Jiles Crocker  Howard, CMA        I have reached out to our RT Dpt to make sure they contact the pt to schedule an appt. Thank you!   Previous Messages    ----- Message -----  From: Glean Hess, CMA  Sent: 08/09/2016 12:31 PM  To: Melissa Stenson  Subject: [place] location. Patient has not heard anything regarding mask fitting. Please advise.     -------------------------- Attempted to contact patient, no answer, will call back.

## 2016-08-09 NOTE — Telephone Encounter (Signed)
Patients wife calling because they have not heard anything from [facility] regarding mask fitting.   Sent message to [facility] requesting that they contact our office regarding mask fitting.  Awaiting response back from [place].

## 2016-08-16 ENCOUNTER — Encounter: Payer: Self-pay | Admitting: Pulmonary Disease

## 2016-08-16 ENCOUNTER — Ambulatory Visit (INDEPENDENT_AMBULATORY_CARE_PROVIDER_SITE_OTHER): Payer: Medicare Other | Admitting: Pulmonary Disease

## 2016-08-16 DIAGNOSIS — I255 Ischemic cardiomyopathy: Secondary | ICD-10-CM | POA: Diagnosis not present

## 2016-08-16 DIAGNOSIS — J449 Chronic obstructive pulmonary disease, unspecified: Secondary | ICD-10-CM | POA: Diagnosis not present

## 2016-08-16 DIAGNOSIS — G4733 Obstructive sleep apnea (adult) (pediatric): Secondary | ICD-10-CM | POA: Diagnosis not present

## 2016-08-16 NOTE — Progress Notes (Signed)
Subjective:    Patient ID: [name], male    DOB: 04-01-1934, 82 y.o.   MRN: [id number]  HPI  82 year old male former smoker followed for COPD/emphysema and severe OSA Patient has atrial fibrillation   TEST  Sleep tests PSG 06/14/06>>AHI 35.5  Auto CPAP 05/19/16 to 06/17/16 >> used on 28 to 30 nights with average 6 hrs 57 min.  Average AHI 18.7 with median CPAP 8 and 95 th percentile CPAP 11 cm H2O. CPAP titration 06/23/16 >> CPAP 16 cm H2O >> AHI 6.7  Pulmonary tests PFT 03/30/11>>FEV1 1.98(72%), FEV1% 68, DLCO 64%  PFT 04/19/12>>FEV1 1.65 (67%), FEV1% 63, TLC 5.66 (99%), DLCO 71%, no BD. PFT 12/19/13 >> FEV1 2.03 (77%), FEV1% 72, TLC 6.45 (98%), DLCO 49% CT chest 01/18/14 >> mild centrilobular and paraseptal emphysema, mild fibrotic changes Rt periphery CT chest 01/15/16 >> emphysema  Cardiac tests Echo 01/11/16 >> EF 35 to 40%, mild LVH, grade 2 diastolic CHF, mod/severe LA dilation, PAS 23 mmHg  08/16/2016 Follow up : OSA /COPD  Patient returns for a follow-up office visit.. Patient was recently found to have not as well-controlled sleep apnea. C Pap download in August showed AHI at 18.7. Patient was set up for his C Pap titration study 06/23/2016 that showed improved control on his C Pap of 16 cmH2O (AHI 6.7) .  Patient was seen 2 weeks ago in the office and was having mask issues with lots of leaks. Patient was sent for a mask fitting at the sleep lab. Patient says he really likes his new mask and is very comfortable, does not wake him or his wife up at night. Download was done today. For the last week and showed improved control with AHI 8.1, excellent compliance, with average usage at 7 hours. ++ leaks but says he does not notice them.   Patient says that his breathing has been doing better with decreased shortness of breath. He remains on [place] and Spiriva. He also uses albuterol nebulizer 4 times a day. Patient feels like he does not need to use his nebulizer this much.  He denies any wheezing, hemoptysis, chest pain, orthopnea, PND, or increased leg swelling  Past Medical History:  Diagnosis Date  . Atrial fibrillation ([place])    persistent, previously seen at [facility] and placed on amiodarone  . Benign prostatic hypertrophy   . CAD (coronary artery disease)    multivessel s/p inferolateral wall MI with subsequent CABG 11/1998.  Cath 2009 with Patent grafts  . Cleft palate   . COPD with emphysema ([place]) 04/01/2010  . DM (diabetes mellitus), type 2 ([place])   . Dyspnea   . GERD (gastroesophageal reflux disease)   . HTN (hypertension)   . Hyperlipidemia   . Hypothyroidism   . Iron deficiency anemia   . Ischemic dilated cardiomyopathy ([place])    EF 35-40% by MUGA 6/11  . Myocardial infarction   . Nasal septal deviation   . Nephrolithiasis   . OSA (obstructive sleep apnea)   . PAF (paroxysmal atrial fibrillation) ([place])   . Peripheral arterial disease (HCC)    left subclavian artery stenosis  . PNA (pneumonia)   . Psoriasis   . Seborrheic keratosis   . Stroke ([place])   . Systolic congestive heart failure ([place]) 2009   s/p BiV ICD implantation by Dr Leonia Reeves ([facility])   Current Outpatient Prescriptions on File Prior to Visit  Medication Sig Dispense Refill  . albuterol (PROVENTIL HFA;VENTOLIN HFA) 108 (90 BASE) MCG/ACT inhaler Inhale  2 puffs into the lungs every 6 (six) hours as needed for wheezing or shortness of breath. Must keep June 2016 appt. 1 Inhaler 0  . albuterol (PROVENTIL) (2.5 MG/3ML) 0.083% nebulizer solution USE 1 VIAL VIA NEBULIZER 4 TIMES A DAY AS DIRECTED 300 mL 2  . amiodarone (PACERONE) 200 MG tablet Take 200 mg by mouth daily.    [name] aspirin 81 MG tablet Take 81 mg by mouth daily.      [name] atorvastatin (LIPITOR) 80 MG tablet TAKE 1 TABLET (80 MG TOTAL) BY MOUTH DAILY. 90 tablet 2  . carvedilol (COREG) 6.25 MG tablet TAKE 1 TABLET BY MOUTH TWICE A DAY 180 tablet 2  . DULERA 100-5 MCG/ACT AERO TAKE 2 PUFFS BY MOUTH TWICE A DAY 1 Inhaler 6  . ezetimibe  (ZETIA) 10 MG tablet Take 10 mg by mouth daily.    . fluticasone (FLONASE) 50 MCG/ACT nasal spray Place 2 sprays into both nostrils daily. 16 g 2  . furosemide (LASIX) 40 MG tablet Take 40 mg by mouth 2 (two) times daily.    . insulin aspart (NOVOLOG) 100 UNIT/ML injection Inject 6 units into the skin before breakfast and lunch, inject 6  units into the skin before supper 6 units    . levothyroxine (SYNTHROID, LEVOTHROID) 100 MCG tablet Take 100 mcg by mouth daily before breakfast.     . Multiple Vitamin (MULTIVITAMIN) tablet Take 1 tablet by mouth daily.      [name] omeprazole (PRILOSEC) 20 MG capsule Take 20 mg by mouth daily.      [name] senna (SENOKOT) 8.6 MG tablet Take 2 tablets by mouth at bedtime. 2 tabs in the morning    . spironolactone (ALDACTONE) 25 MG tablet TAKE 1/2 TABLET (12.5 MG TOTAL) BY MOUTH DAILY. 45 tablet 1  . Tiotropium Bromide Monohydrate (SPIRIVA RESPIMAT) 2.5 MCG/ACT AERS Inhale 2 puffs into the lungs daily. 1 Inhaler 5  . TRESIBA FLEXTOUCH 100 UNIT/ML SOPN Inject 50 Units into the skin daily. MORNINGS  3  . vitamin B-12 (CYANOCOBALAMIN) 1000 MCG tablet Take 1,000 mcg by mouth daily.    [name] warfarin (COUMADIN) 5 MG tablet Take 5 mg by mouth daily.    [name] warfarin (COUMADIN) 5 MG tablet TAKE AS DIRECTED BY COUMADIN CLINIC 40 tablet 3  . doxycycline (VIBRA-TABS) 100 MG tablet Take 100 mg by mouth 2 (two) times daily.     No current facility-administered medications on file prior to visit.       Review of Systems Constitutional:   No  weight loss, night sweats,  Fevers, chills,  +fatigue, or  lassitude.  HEENT:   No headaches,  Difficulty swallowing,  Tooth/dental problems, or  Sore throat,                No sneezing, itching, ear ache, nasal congestion, post nasal drip,   CV:  No chest pain,  Orthopnea, PND, swelling in lower extremities, anasarca, dizziness, palpitations, syncope.   GI  No heartburn, indigestion, abdominal pain, nausea, vomiting, diarrhea, change in bowel  habits, loss of appetite, bloody stools.   Resp:   No excess mucus, no productive cough,  No non-productive cough,  No coughing up of blood.  No change in color of mucus.  No wheezing.  No chest wall deformity  Skin: no rash or lesions.  GU: no dysuria, change in color of urine, no urgency or frequency.  No flank pain, no hematuria   MS:  No joint pain or swelling.  No  decreased range of motion.  No back pain.  Psych:  No change in mood or affect. No depression or anxiety.  No memory loss.         Objective:   Physical Exam Vitals:   08/16/16 0909  BP: 110/70  Pulse: 78  SpO2: 95%  Weight: 243 lb 6.4 oz (110.4 kg)  Height: 5\' 8"  (1.727 m)   GEN: A/Ox3; pleasant , NAD, obese ,    HEENT:  [place]/AT,  EACs-clear, TMs-wnl, NOSE-clear, THROAT-clear, no lesions, no postnasal drip or exudate noted. Class 2-3 MP airway , upper dentures   NECK:  Supple w/ fair ROM; no JVD; normal carotid impulses w/o bruits; no thyromegaly or nodules palpated; no lymphadenopathy.    RESP  Clear  P & A; w/o, wheezes/ rales/ or rhonchi. no accessory muscle use, no dullness to percussion  CARD:  RRR, no m/r/g  , tr  peripheral edema, pulses intact, no cyanosis or clubbing.  GI:   Soft & nt; nml bowel sounds; no organomegaly or masses detected.   Musco: Warm bil, no deformities or joint swelling noted.   Neuro: alert, no focal deficits noted.    Skin: Warm, no lesions or rashes,   Tammy Parrett NP-C  [facility]  08/16/2016        Assessment & Plan:

## 2016-08-16 NOTE — Assessment & Plan Note (Addendum)
Improved control , better tolerance with new mask   Plan  Patient Instructions  Keep up good work on CPAP .  Work on weight loss.  Do not drive if sleepy .     follow up Dr. Halford Chessman  In 3 months and As needed

## 2016-08-16 NOTE — Progress Notes (Signed)
Reviewed and agree with assessment/plan.  Chesley Mires, MD [facility] Pulmonary/Critical Care 08/16/2016, 12:19 PM Pager:  [phone]

## 2016-08-16 NOTE — Patient Instructions (Signed)
Keep up good work on CPAP .  Work on weight loss.  Do not drive if sleepy .  Continue on Dulera and Spiriva , rinse after use.  May decrease Albuterol Neb Three times a day  .  follow up Dr. Halford Chessman  In 3 months and As needed

## 2016-08-16 NOTE — Assessment & Plan Note (Signed)
Controlled on current regimen  Decrease albuterol use   Plan  Patient Instructions   Continue on Dulera and Spiriva , rinse after use.  May decrease Albuterol Neb Three times a day  .  follow up Dr. Halford Chessman  In 3 months and As needed

## 2016-08-19 ENCOUNTER — Ambulatory Visit (INDEPENDENT_AMBULATORY_CARE_PROVIDER_SITE_OTHER): Payer: Medicare Other

## 2016-08-19 ENCOUNTER — Encounter: Payer: Self-pay | Admitting: Adult Health

## 2016-08-19 DIAGNOSIS — I5022 Chronic systolic (congestive) heart failure: Secondary | ICD-10-CM | POA: Diagnosis not present

## 2016-08-19 DIAGNOSIS — Z95810 Presence of automatic (implantable) cardiac defibrillator: Secondary | ICD-10-CM

## 2016-08-20 ENCOUNTER — Telehealth: Payer: Self-pay

## 2016-08-20 NOTE — Progress Notes (Signed)
EPIC Encounter for ICM Monitoring  Patient Name: [name] is a 82 y.o. male Date: 08/20/2016 Primary Care Physican: Irven Shelling, MD [facility] [place] Electrophysiologist: Allred Dry Weight:unknown Bi-V Pacing: 92.9%(was 99.8% on 07/01/2016)                                         Clinical Status (19-Jul-2016 to 19-Aug-2016) Treated VT/VF 0 episodes  AT/AF 7 episodes  Time in AT/AF 2.0 hr/day (8.4%)   Observations (2) (19-Jul-2016 to 19-Aug-2016) AT/AF >= 6 hr for 3 days. Patient Activity less than 1 hr/day for 2 weeks.     Attempted ICM call and unable to reach. Left detailed message regarding transmission.  Transmission reviewed.   Thoracic impedance normal     Follow-up plan: ICM clinic phone appointment on 10/07/2016.  Office appointment with Dr Rayann Heman 09/06/2016  Copy of ICM check sent to device physician.   ICM trend: 08/19/2016       Rosalene Billings, RN 08/20/2016 8:11 AM

## 2016-08-20 NOTE — Telephone Encounter (Signed)
Remote ICM transmission received.  Attempted patient call and left detailed message regarding transmission and next ICM scheduled for 10/07/2016.  Advised to return call for any fluid symptoms or questions.

## 2016-08-23 DIAGNOSIS — L570 Actinic keratosis: Secondary | ICD-10-CM | POA: Diagnosis not present

## 2016-08-23 DIAGNOSIS — L820 Inflamed seborrheic keratosis: Secondary | ICD-10-CM | POA: Diagnosis not present

## 2016-08-23 DIAGNOSIS — L814 Other melanin hyperpigmentation: Secondary | ICD-10-CM | POA: Diagnosis not present

## 2016-08-23 DIAGNOSIS — L821 Other seborrheic keratosis: Secondary | ICD-10-CM | POA: Diagnosis not present

## 2016-09-03 ENCOUNTER — Emergency Department (HOSPITAL_COMMUNITY): Payer: Medicare Other

## 2016-09-03 ENCOUNTER — Inpatient Hospital Stay (HOSPITAL_COMMUNITY)
Admission: EM | Admit: 2016-09-03 | Discharge: 2016-09-06 | DRG: 193 | Disposition: A | Discharge status: Home / Self Care | Payer: Medicare Other | Attending: Internal Medicine | Admitting: Internal Medicine

## 2016-09-03 ENCOUNTER — Encounter (HOSPITAL_COMMUNITY): Payer: Self-pay | Admitting: *Deleted

## 2016-09-03 DIAGNOSIS — R079 Chest pain, unspecified: Secondary | ICD-10-CM | POA: Diagnosis not present

## 2016-09-03 DIAGNOSIS — Z7901 Long term (current) use of anticoagulants: Secondary | ICD-10-CM

## 2016-09-03 DIAGNOSIS — Z794 Long term (current) use of insulin: Secondary | ICD-10-CM

## 2016-09-03 DIAGNOSIS — L409 Psoriasis, unspecified: Secondary | ICD-10-CM | POA: Diagnosis present

## 2016-09-03 DIAGNOSIS — I5023 Acute on chronic systolic (congestive) heart failure: Secondary | ICD-10-CM | POA: Diagnosis present

## 2016-09-03 DIAGNOSIS — J440 Chronic obstructive pulmonary disease with acute lower respiratory infection: Secondary | ICD-10-CM | POA: Diagnosis present

## 2016-09-03 DIAGNOSIS — I252 Old myocardial infarction: Secondary | ICD-10-CM | POA: Diagnosis not present

## 2016-09-03 DIAGNOSIS — J441 Chronic obstructive pulmonary disease with (acute) exacerbation: Secondary | ICD-10-CM | POA: Diagnosis present

## 2016-09-03 DIAGNOSIS — J40 Bronchitis, not specified as acute or chronic: Secondary | ICD-10-CM | POA: Diagnosis not present

## 2016-09-03 DIAGNOSIS — Z95810 Presence of automatic (implantable) cardiac defibrillator: Secondary | ICD-10-CM | POA: Diagnosis present

## 2016-09-03 DIAGNOSIS — Z87891 Personal history of nicotine dependence: Secondary | ICD-10-CM

## 2016-09-03 DIAGNOSIS — K219 Gastro-esophageal reflux disease without esophagitis: Secondary | ICD-10-CM | POA: Diagnosis present

## 2016-09-03 DIAGNOSIS — J449 Chronic obstructive pulmonary disease, unspecified: Secondary | ICD-10-CM | POA: Diagnosis not present

## 2016-09-03 DIAGNOSIS — G4733 Obstructive sleep apnea (adult) (pediatric): Secondary | ICD-10-CM | POA: Diagnosis present

## 2016-09-03 DIAGNOSIS — E1151 Type 2 diabetes mellitus with diabetic peripheral angiopathy without gangrene: Secondary | ICD-10-CM | POA: Diagnosis present

## 2016-09-03 DIAGNOSIS — R410 Disorientation, unspecified: Secondary | ICD-10-CM | POA: Diagnosis present

## 2016-09-03 DIAGNOSIS — E785 Hyperlipidemia, unspecified: Secondary | ICD-10-CM | POA: Diagnosis present

## 2016-09-03 DIAGNOSIS — N179 Acute kidney failure, unspecified: Secondary | ICD-10-CM | POA: Diagnosis present

## 2016-09-03 DIAGNOSIS — I5022 Chronic systolic (congestive) heart failure: Secondary | ICD-10-CM | POA: Diagnosis not present

## 2016-09-03 DIAGNOSIS — Z7951 Long term (current) use of inhaled steroids: Secondary | ICD-10-CM

## 2016-09-03 DIAGNOSIS — Z8673 Personal history of transient ischemic attack (TIA), and cerebral infarction without residual deficits: Secondary | ICD-10-CM

## 2016-09-03 DIAGNOSIS — R0602 Shortness of breath: Secondary | ICD-10-CM | POA: Diagnosis not present

## 2016-09-03 DIAGNOSIS — I480 Paroxysmal atrial fibrillation: Secondary | ICD-10-CM | POA: Diagnosis present

## 2016-09-03 DIAGNOSIS — E1140 Type 2 diabetes mellitus with diabetic neuropathy, unspecified: Secondary | ICD-10-CM | POA: Diagnosis present

## 2016-09-03 DIAGNOSIS — E1122 Type 2 diabetes mellitus with diabetic chronic kidney disease: Secondary | ICD-10-CM | POA: Diagnosis present

## 2016-09-03 DIAGNOSIS — N183 Chronic kidney disease, stage 3 unspecified: Secondary | ICD-10-CM | POA: Diagnosis present

## 2016-09-03 DIAGNOSIS — J9601 Acute respiratory failure with hypoxia: Secondary | ICD-10-CM | POA: Diagnosis present

## 2016-09-03 DIAGNOSIS — I130 Hypertensive heart and chronic kidney disease with heart failure and stage 1 through stage 4 chronic kidney disease, or unspecified chronic kidney disease: Secondary | ICD-10-CM | POA: Diagnosis present

## 2016-09-03 DIAGNOSIS — E11649 Type 2 diabetes mellitus with hypoglycemia without coma: Secondary | ICD-10-CM | POA: Diagnosis present

## 2016-09-03 DIAGNOSIS — I2510 Atherosclerotic heart disease of native coronary artery without angina pectoris: Secondary | ICD-10-CM | POA: Diagnosis present

## 2016-09-03 DIAGNOSIS — I420 Dilated cardiomyopathy: Secondary | ICD-10-CM | POA: Diagnosis present

## 2016-09-03 DIAGNOSIS — J189 Pneumonia, unspecified organism: Principal | ICD-10-CM

## 2016-09-03 DIAGNOSIS — Z951 Presence of aortocoronary bypass graft: Secondary | ICD-10-CM | POA: Diagnosis not present

## 2016-09-03 DIAGNOSIS — N400 Enlarged prostate without lower urinary tract symptoms: Secondary | ICD-10-CM | POA: Diagnosis present

## 2016-09-03 DIAGNOSIS — Z823 Family history of stroke: Secondary | ICD-10-CM

## 2016-09-03 DIAGNOSIS — I10 Essential (primary) hypertension: Secondary | ICD-10-CM | POA: Diagnosis present

## 2016-09-03 DIAGNOSIS — I255 Ischemic cardiomyopathy: Secondary | ICD-10-CM | POA: Diagnosis present

## 2016-09-03 DIAGNOSIS — Z6836 Body mass index (BMI) 36.0-36.9, adult: Secondary | ICD-10-CM

## 2016-09-03 DIAGNOSIS — Z7982 Long term (current) use of aspirin: Secondary | ICD-10-CM

## 2016-09-03 DIAGNOSIS — Z825 Family history of asthma and other chronic lower respiratory diseases: Secondary | ICD-10-CM

## 2016-09-03 DIAGNOSIS — J9600 Acute respiratory failure, unspecified whether with hypoxia or hypercapnia: Secondary | ICD-10-CM | POA: Diagnosis present

## 2016-09-03 DIAGNOSIS — E039 Hypothyroidism, unspecified: Secondary | ICD-10-CM | POA: Diagnosis present

## 2016-09-03 DIAGNOSIS — Z8249 Family history of ischemic heart disease and other diseases of the circulatory system: Secondary | ICD-10-CM

## 2016-09-03 LAB — GLUCOSE, CAPILLARY: Glucose-Capillary: 185 mg/dL — ABNORMAL HIGH (ref 65–99)

## 2016-09-03 LAB — BASIC METABOLIC PANEL
Anion gap: 9 (ref 5–15)
BUN: 21 mg/dL — ABNORMAL HIGH (ref 6–20)
CO2: 28 mmol/L (ref 22–32)
Calcium: 8.8 mg/dL — ABNORMAL LOW (ref 8.9–10.3)
Chloride: 98 mmol/L — ABNORMAL LOW (ref 101–111)
Creatinine, Ser: 1.48 mg/dL — ABNORMAL HIGH (ref 0.61–1.24)
GFR calc Af Amer: 50 mL/min — ABNORMAL LOW (ref >60)
GFR calc non Af Amer: 43 mL/min — ABNORMAL LOW (ref >60)
Glucose, Bld: 51 mg/dL — ABNORMAL LOW (ref 65–99)
Potassium: 3.9 mmol/L (ref 3.5–5.1)
Sodium: 135 mmol/L (ref 135–145)

## 2016-09-03 LAB — CBC
HCT: 40.5 % (ref 39.0–52.0)
Hemoglobin: 12.9 g/dL — ABNORMAL LOW (ref 13.0–17.0)
MCH: 26.4 pg (ref 26.0–34.0)
MCHC: 31.9 g/dL (ref 30.0–36.0)
MCV: 82.8 fL (ref 78.0–100.0)
Platelets: 204 10*3/uL (ref 150–400)
RBC: 4.89 MIL/uL (ref 4.22–5.81)
RDW: 25.7 % — ABNORMAL HIGH (ref 11.5–15.5)
WBC: 13.8 10*3/uL — ABNORMAL HIGH (ref 4.0–10.5)

## 2016-09-03 LAB — BRAIN NATRIURETIC PEPTIDE: B Natriuretic Peptide: 552.6 pg/mL — ABNORMAL HIGH (ref 0.0–100.0)

## 2016-09-03 LAB — I-STAT TROPONIN, ED: Troponin i, poc: 0.04 ng/mL (ref 0.00–0.08)

## 2016-09-03 LAB — MRSA PCR SCREENING: MRSA by PCR: NEGATIVE

## 2016-09-03 LAB — PROTIME-INR
INR: 2.98
Prothrombin Time: 31.6 seconds — ABNORMAL HIGH (ref 11.4–15.2)

## 2016-09-03 LAB — LACTIC ACID, PLASMA: Lactic Acid, Venous: 1.2 mmol/L (ref 0.5–1.9)

## 2016-09-03 LAB — INFLUENZA PANEL BY PCR (TYPE A & B)
Influenza A By PCR: NEGATIVE
Influenza B By PCR: NEGATIVE

## 2016-09-03 LAB — CBG MONITORING, ED: Glucose-Capillary: 147 mg/dL — ABNORMAL HIGH (ref 65–99)

## 2016-09-03 LAB — I-STAT CG4 LACTIC ACID, ED: Lactic Acid, Venous: 1.32 mmol/L (ref 0.5–1.9)

## 2016-09-03 MED ORDER — ALBUTEROL SULFATE (2.5 MG/3ML) 0.083% IN NEBU
5.0000 mg | INHALATION_SOLUTION | Freq: Once | RESPIRATORY_TRACT | Status: AC
  Administered 2016-09-03: 5 mg via RESPIRATORY_TRACT
  Filled 2016-09-03: qty 6

## 2016-09-03 MED ORDER — PANTOPRAZOLE SODIUM 40 MG PO TBEC
40.0000 mg | DELAYED_RELEASE_TABLET | Freq: Every day | ORAL | Status: DC
  Administered 2016-09-04 – 2016-09-06 (×3): 40 mg via ORAL
  Filled 2016-09-03 (×3): qty 1

## 2016-09-03 MED ORDER — FUROSEMIDE 10 MG/ML IJ SOLN
40.0000 mg | Freq: Once | INTRAMUSCULAR | Status: AC
  Administered 2016-09-03: 40 mg via INTRAVENOUS
  Filled 2016-09-03: qty 4

## 2016-09-03 MED ORDER — ONDANSETRON HCL 4 MG/2ML IJ SOLN: 4.0000 mg | Freq: Four times a day (QID) | INTRAMUSCULAR | Status: DC | PRN

## 2016-09-03 MED ORDER — WARFARIN - PHARMACIST DOSING INPATIENT
Freq: Every day | Status: DC
  Administered 2016-09-05: 17:00:00

## 2016-09-03 MED ORDER — ALBUTEROL SULFATE (2.5 MG/3ML) 0.083% IN NEBU
2.5000 mg | INHALATION_SOLUTION | Freq: Four times a day (QID) | RESPIRATORY_TRACT | Status: DC
  Administered 2016-09-03: 2.5 mg via RESPIRATORY_TRACT
  Filled 2016-09-03: qty 3

## 2016-09-03 MED ORDER — SODIUM CHLORIDE 0.9 % IV SOLN
1000.0000 mL | INTRAVENOUS | Status: DC
  Administered 2016-09-03: 1000 mL via INTRAVENOUS

## 2016-09-03 MED ORDER — DEXTROSE 5 % IV SOLN
500.0000 mg | INTRAVENOUS | Status: DC
  Administered 2016-09-04: 500 mg via INTRAVENOUS
  Filled 2016-09-03 (×2): qty 500

## 2016-09-03 MED ORDER — DEXTROSE 5 % IV SOLN
2.0000 g | INTRAVENOUS | Status: DC
  Administered 2016-09-04 – 2016-09-05 (×2): 2 g via INTRAVENOUS
  Filled 2016-09-03 (×3): qty 2

## 2016-09-03 MED ORDER — SENNA 8.6 MG PO TABS
2.0000 | ORAL_TABLET | Freq: Every day | ORAL | Status: DC
  Administered 2016-09-03 – 2016-09-05 (×3): 17.2 mg via ORAL
  Filled 2016-09-03 (×3): qty 2

## 2016-09-03 MED ORDER — ONDANSETRON HCL 4 MG PO TABS: 4.0000 mg | ORAL_TABLET | Freq: Four times a day (QID) | ORAL | Status: DC | PRN

## 2016-09-03 MED ORDER — GUAIFENESIN ER 600 MG PO TB12
600.0000 mg | ORAL_TABLET | Freq: Two times a day (BID) | ORAL | Status: DC
  Administered 2016-09-03 – 2016-09-06 (×6): 600 mg via ORAL
  Filled 2016-09-03 (×6): qty 1

## 2016-09-03 MED ORDER — VITAMIN B-12 1000 MCG PO TABS
1000.0000 ug | ORAL_TABLET | Freq: Every day | ORAL | Status: DC
  Administered 2016-09-04 – 2016-09-06 (×3): 1000 ug via ORAL
  Filled 2016-09-03 (×3): qty 1

## 2016-09-03 MED ORDER — METHYLPREDNISOLONE SODIUM SUCC 125 MG IJ SOLR
125.0000 mg | Freq: Once | INTRAMUSCULAR | Status: AC
  Administered 2016-09-03: 125 mg via INTRAVENOUS
  Filled 2016-09-03: qty 2

## 2016-09-03 MED ORDER — ASPIRIN EC 81 MG PO TBEC
81.0000 mg | DELAYED_RELEASE_TABLET | Freq: Every day | ORAL | Status: DC
Start: 2016-09-04 — End: 2016-09-06
  Administered 2016-09-04 – 2016-09-06 (×3): 81 mg via ORAL
  Filled 2016-09-03 (×3): qty 1

## 2016-09-03 MED ORDER — ORAL CARE MOUTH RINSE
15.0000 mL | Freq: Two times a day (BID) | OROMUCOSAL | Status: DC
  Administered 2016-09-04 – 2016-09-06 (×5): 15 mL via OROMUCOSAL

## 2016-09-03 MED ORDER — IPRATROPIUM BROMIDE 0.02 % IN SOLN
0.5000 mg | Freq: Once | RESPIRATORY_TRACT | Status: AC
  Administered 2016-09-03: 0.5 mg via RESPIRATORY_TRACT
  Filled 2016-09-03: qty 2.5

## 2016-09-03 MED ORDER — METHYLPREDNISOLONE SODIUM SUCC 40 MG IJ SOLR
40.0000 mg | Freq: Two times a day (BID) | INTRAMUSCULAR | Status: DC
  Administered 2016-09-04: 40 mg via INTRAVENOUS
  Filled 2016-09-03: qty 1

## 2016-09-03 MED ORDER — CARVEDILOL 6.25 MG PO TABS
6.2500 mg | ORAL_TABLET | Freq: Two times a day (BID) | ORAL | Status: DC
  Administered 2016-09-03 – 2016-09-06 (×6): 6.25 mg via ORAL
  Filled 2016-09-03 (×6): qty 1

## 2016-09-03 MED ORDER — SODIUM CHLORIDE 0.9% FLUSH
3.0000 mL | Freq: Two times a day (BID) | INTRAVENOUS | Status: DC
  Administered 2016-09-03 – 2016-09-05 (×5): 3 mL via INTRAVENOUS

## 2016-09-03 MED ORDER — IPRATROPIUM BROMIDE 0.02 % IN SOLN
0.5000 mg | Freq: Four times a day (QID) | RESPIRATORY_TRACT | Status: DC
  Administered 2016-09-03: 0.5 mg via RESPIRATORY_TRACT
  Filled 2016-09-03: qty 2.5

## 2016-09-03 MED ORDER — FLUTICASONE PROPIONATE 50 MCG/ACT NA SUSP
2.0000 | Freq: Every day | NASAL | Status: DC
  Administered 2016-09-04 – 2016-09-06 (×3): 2 via NASAL
  Filled 2016-09-03: qty 16

## 2016-09-03 MED ORDER — FERROUS SULFATE 325 (65 FE) MG PO TABS
325.0000 mg | ORAL_TABLET | Freq: Every day | ORAL | Status: DC
  Administered 2016-09-04 – 2016-09-06 (×3): 325 mg via ORAL
  Filled 2016-09-03 (×3): qty 1

## 2016-09-03 MED ORDER — SODIUM CHLORIDE 0.9 % IV SOLN: 250.0000 mL | INTRAVENOUS | Status: DC | PRN

## 2016-09-03 MED ORDER — EZETIMIBE 10 MG PO TABS
10.0000 mg | ORAL_TABLET | Freq: Every day | ORAL | Status: DC
  Administered 2016-09-04 – 2016-09-06 (×3): 10 mg via ORAL
  Filled 2016-09-03 (×3): qty 1

## 2016-09-03 MED ORDER — DEXTROSE 5 % IV SOLN: 1.0000 g | Freq: Once | INTRAVENOUS | Status: DC

## 2016-09-03 MED ORDER — INSULIN ASPART 100 UNIT/ML ~~LOC~~ SOLN
0.0000 [IU] | Freq: Three times a day (TID) | SUBCUTANEOUS | Status: DC
  Administered 2016-09-04: 5 [IU] via SUBCUTANEOUS
  Administered 2016-09-04 (×2): 8 [IU] via SUBCUTANEOUS

## 2016-09-03 MED ORDER — ADULT MULTIVITAMIN W/MINERALS CH
1.0000 | ORAL_TABLET | Freq: Every day | ORAL | Status: DC
  Administered 2016-09-04 – 2016-09-06 (×3): 1 via ORAL
  Filled 2016-09-03 (×3): qty 1

## 2016-09-03 MED ORDER — ACETAMINOPHEN 325 MG PO TABS
650.0000 mg | ORAL_TABLET | Freq: Four times a day (QID) | ORAL | Status: DC | PRN
  Administered 2016-09-04: 650 mg via ORAL
  Filled 2016-09-03: qty 2

## 2016-09-03 MED ORDER — SODIUM CHLORIDE 0.9 % IV SOLN: INTRAVENOUS | Status: DC

## 2016-09-03 MED ORDER — SODIUM CHLORIDE 0.9% FLUSH: 3.0000 mL | INTRAVENOUS | Status: DC | PRN

## 2016-09-03 MED ORDER — LEVOTHYROXINE SODIUM 100 MCG PO TABS
100.0000 ug | ORAL_TABLET | Freq: Every day | ORAL | Status: DC
  Administered 2016-09-04 – 2016-09-06 (×3): 100 ug via ORAL
  Filled 2016-09-03 (×3): qty 1

## 2016-09-03 MED ORDER — ATORVASTATIN CALCIUM 80 MG PO TABS
80.0000 mg | ORAL_TABLET | Freq: Every day | ORAL | Status: DC
  Administered 2016-09-04 – 2016-09-05 (×2): 80 mg via ORAL
  Filled 2016-09-03 (×2): qty 1

## 2016-09-03 MED ORDER — FUROSEMIDE 20 MG PO TABS
40.0000 mg | ORAL_TABLET | Freq: Two times a day (BID) | ORAL | Status: DC
  Administered 2016-09-04 – 2016-09-06 (×5): 40 mg via ORAL
  Filled 2016-09-03 (×5): qty 2

## 2016-09-03 MED ORDER — MOMETASONE FURO-FORMOTEROL FUM 100-5 MCG/ACT IN AERO
2.0000 | INHALATION_SPRAY | Freq: Two times a day (BID) | RESPIRATORY_TRACT | Status: DC
  Administered 2016-09-03 – 2016-09-06 (×6): 2 via RESPIRATORY_TRACT
  Filled 2016-09-03: qty 8.8

## 2016-09-03 MED ORDER — AMIODARONE HCL 200 MG PO TABS
200.0000 mg | ORAL_TABLET | Freq: Every day | ORAL | Status: DC
Start: 2016-09-04 — End: 2016-09-06
  Administered 2016-09-04 – 2016-09-06 (×3): 200 mg via ORAL
  Filled 2016-09-03 (×3): qty 1

## 2016-09-03 MED ORDER — ALBUTEROL SULFATE (2.5 MG/3ML) 0.083% IN NEBU: 2.5000 mg | INHALATION_SOLUTION | RESPIRATORY_TRACT | Status: AC | PRN

## 2016-09-03 MED ORDER — WARFARIN SODIUM 2 MG PO TABS
4.0000 mg | ORAL_TABLET | ORAL | Status: AC
  Administered 2016-09-03: 4 mg via ORAL
  Filled 2016-09-03: qty 1

## 2016-09-03 MED ORDER — CEFTRIAXONE SODIUM 1 G IJ SOLR: 1.0000 g | INTRAMUSCULAR | Status: DC

## 2016-09-03 MED ORDER — ACETAMINOPHEN 650 MG RE SUPP
650.0000 mg | Freq: Four times a day (QID) | RECTAL | Status: DC | PRN
Start: 2016-09-03 — End: 2016-09-06

## 2016-09-03 MED ORDER — DEXTROSE 5 % IV SOLN
2.0000 g | Freq: Once | INTRAVENOUS | Status: AC
  Administered 2016-09-03: 2 g via INTRAVENOUS
  Filled 2016-09-03: qty 2

## 2016-09-03 MED ORDER — TRAMADOL HCL 50 MG PO TABS: 50.0000 mg | ORAL_TABLET | Freq: Four times a day (QID) | ORAL | Status: DC | PRN

## 2016-09-03 MED ORDER — AZITHROMYCIN 500 MG IV SOLR
500.0000 mg | Freq: Once | INTRAVENOUS | Status: AC
  Administered 2016-09-03: 500 mg via INTRAVENOUS
  Filled 2016-09-03: qty 500

## 2016-09-03 NOTE — ED Provider Notes (Signed)
[facility] DEPT Provider Note   CSN: [id number] Arrival date & time: 09/03/16  1557     History   Chief Complaint Chief Complaint  Patient presents with  . Shortness of Breath  . Chest Pain    HPI [name] is a 82 y.o. male.  Patient is an 82 year old male with a history of atrial fibrillation on chronic anticoagulation, heart failure, renal failure, COPD, hypertension presenting today with a 3 day history of worsening fever, myalgias, dry cough, nausea and shortness of breath. Patient went to his PCP today and was found to be oxygenating at 85% on room air which improved to 91% with nasal cannula.  Patient has received a flu shot this year and has also received a pneumonia shot in the past. He denies any recent medication changes.   The history is provided by the patient and the spouse.    Past Medical History:  Diagnosis Date  . Atrial fibrillation ([place])    persistent, previously seen at [facility] and placed on amiodarone  . Benign prostatic hypertrophy   . CAD (coronary artery disease)    multivessel s/p inferolateral wall MI with subsequent CABG 11/1998.  Cath 2009 with Patent grafts  . Cleft palate   . COPD with emphysema ([place]) 04/01/2010  . DM (diabetes mellitus), type 2 ([place])   . Dyspnea   . GERD (gastroesophageal reflux disease)   . HTN (hypertension)   . Hyperlipidemia   . Hypothyroidism   . Iron deficiency anemia   . Ischemic dilated cardiomyopathy ([place])    EF 35-40% by MUGA 6/11  . Myocardial infarction   . Nasal septal deviation   . Nephrolithiasis   . OSA (obstructive sleep apnea)   . PAF (paroxysmal atrial fibrillation) ([place])   . Peripheral arterial disease (HCC)    left subclavian artery stenosis  . PNA (pneumonia)   . Psoriasis   . Seborrheic keratosis   . Stroke ([place])   . Systolic congestive heart failure ([place]) 2009   s/p BiV ICD implantation by Dr Leonia Reeves (MDT)    Patient Active Problem List   Diagnosis Date Noted  . SOB (shortness  of breath)   . A-fib ([place]) 06/11/2016  . Atrial fibrillation, persistent ([place])   . Allergic rhinitis 02/05/2016  . Long term (current) use of anticoagulants [Z79.01] 01/19/2016  . Chronic systolic heart failure ([place])   . Acute respiratory failure with hypoxia ([place]) 01/11/2016  . Acute on chronic systolic CHF (congestive heart failure), NYHA class 3 ([place]) 01/11/2016  . Demand ischemia ([place]) 01/11/2016  . Acute renal failure superimposed on stage 3 chronic kidney disease ([place]) 01/11/2016  . Diabetes mellitus with diabetic neuropathy, with long-term current use of insulin ([place]) 01/11/2016  . Morbid obesity due to excess calories ([place]) 01/11/2016  . COPD (chronic obstructive pulmonary disease) ([place]) 01/10/2016  . Ischemic dilated cardiomyopathy ([place])   . PAF (paroxysmal atrial fibrillation) ([place])   . OSA (obstructive sleep apnea)   . Peripheral vascular disease ([place]) 10/02/2013  . Long term current use of anticoagulant therapy 08/21/2013  . Essential hypertension 04/01/2010  . Automatic implantable cardioverter-defibrillator in situ 03/31/2010    Past Surgical History:  Procedure Laterality Date  . BI-VENTRICULAR IMPLANTABLE CARDIOVERTER DEFIBRILLATOR  (CRT-D)  10-08-08; 11-06-2013   Dr Leonia Reeves (MDT) implant for primary prevention; gen change to MDT VivaXT CRTD by Dr Rayann Heman  . BIV ICD GENERTAOR CHANGE OUT N/A 11/06/2013   Procedure: BIV ICD GENERTAOR CHANGE OUT;  Surgeon: Coralyn Mark, MD;  Location: [facility] CATH LAB;  Service: Cardiovascular;  Laterality: N/A;  . c-spine surgery    . CARDIOVERSION N/A 04/29/2016   Procedure: CARDIOVERSION;  Surgeon: Pixie Casino, MD;  Location: [facility] ENDOSCOPY;  Service: Cardiovascular;  Laterality: N/A;  . CARPAL TUNNEL RELEASE    . CATARACT EXTRACTION    . CORONARY ARTERY BYPASS GRAFT     LIMA to LAD, SVG to OM, SVG to diagonal  . ELECTROPHYSIOLOGIC STUDY N/A 06/11/2016   Procedure: Atrial Fibrillation Ablation;  Surgeon: Thompson Grayer, MD;  Location: [facility] CV LAB;  Service: Cardiovascular;  Laterality: N/A;  . left cleft palate and left cleft lip repair         Home Medications    Prior to Admission medications   Medication Sig Start Date End Date Taking? Authorizing Provider  albuterol (PROVENTIL HFA;VENTOLIN HFA) 108 (90 BASE) MCG/ACT inhaler Inhale 2 puffs into the lungs every 6 (six) hours as needed for wheezing or shortness of breath. Must keep June 2016 appt. 03/04/15   Chesley Mires, MD  albuterol (PROVENTIL) (2.5 MG/3ML) 0.083% nebulizer solution USE 1 VIAL VIA NEBULIZER 4 TIMES A DAY AS DIRECTED 07/05/16   Chesley Mires, MD  amiodarone (PACERONE) 200 MG tablet Take 200 mg by mouth daily.    Historical Provider, MD  aspirin 81 MG tablet Take 81 mg by mouth daily.      Historical Provider, MD  atorvastatin (LIPITOR) 80 MG tablet TAKE 1 TABLET (80 MG TOTAL) BY MOUTH DAILY. 02/05/16   Sueanne Margarita, MD  carvedilol (COREG) 6.25 MG tablet TAKE 1 TABLET BY MOUTH TWICE A DAY 02/05/16   Sueanne Margarita, MD  doxycycline (VIBRA-TABS) 100 MG tablet Take 100 mg by mouth 2 (two) times daily.    Historical Provider, MD  DULERA 100-5 MCG/ACT AERO TAKE 2 PUFFS BY MOUTH TWICE A DAY 12/12/15   Chesley Mires, MD  ezetimibe (ZETIA) 10 MG tablet Take 10 mg by mouth daily.    Historical Provider, MD  fluticasone (FLONASE) 50 MCG/ACT nasal spray Place 2 sprays into both nostrils daily. 06/17/16   Chesley Mires, MD  furosemide (LASIX) 40 MG tablet Take 40 mg by mouth 2 (two) times daily.    Historical Provider, MD  insulin aspart (NOVOLOG) 100 UNIT/ML injection Inject 6 units into the skin before breakfast and lunch, inject 6  units into the skin before supper 6 units    Historical Provider, MD  levothyroxine (SYNTHROID, LEVOTHROID) 100 MCG tablet Take 100 mcg by mouth daily before breakfast.  09/23/15   Historical Provider, MD  Multiple Vitamin (MULTIVITAMIN) tablet Take 1 tablet by mouth daily.      Historical Provider, MD  omeprazole (PRILOSEC) 20 MG capsule  Take 20 mg by mouth daily.      Historical Provider, MD  senna (SENOKOT) 8.6 MG tablet Take 2 tablets by mouth at bedtime. 2 tabs in the morning    Historical Provider, MD  spironolactone (ALDACTONE) 25 MG tablet TAKE 1/2 TABLET (12.5 MG TOTAL) BY MOUTH DAILY. 07/12/16   Jettie Booze, MD  Tiotropium Bromide Monohydrate (SPIRIVA RESPIMAT) 2.5 MCG/ACT AERS Inhale 2 puffs into the lungs daily. 03/29/16   Chesley Mires, MD  TRESIBA FLEXTOUCH 100 UNIT/ML SOPN Inject 50 Units into the skin daily. MORNINGS 02/08/16   Historical Provider, MD  vitamin B-12 (CYANOCOBALAMIN) 1000 MCG tablet Take 1,000 mcg by mouth daily.    Historical Provider, MD  warfarin (COUMADIN) 5 MG tablet Take 5 mg by mouth daily.  Historical Provider, MD  warfarin (COUMADIN) 5 MG tablet TAKE AS DIRECTED BY COUMADIN CLINIC 08/09/16   Jettie Booze, MD    Family History Family History  Problem Relation Age of Onset  . Asthma Father   . Stroke Father   . Hypertension Father   . Hypertension Mother   . Heart disease Brother   . Heart attack Brother   . Hypertension Sister   . Hypertension Brother     Social History Social History  Substance Use Topics  . Smoking status: Former Smoker    Packs/day: 3.00    Years: 65.00    Types: Cigarettes    Quit date: 10/25/1998  . Smokeless tobacco: Never Used  . Alcohol use No     Comment: remote history of heavy alcohol use     Allergies   Patient has no known allergies.   Review of Systems Review of Systems  All other systems reviewed and are negative.    Physical Exam Updated Vital Signs BP (!) 110/48   Pulse 69   Temp 98.1 F (36.7 C) (Oral)   Resp 26   Ht 5\' 8"  (1.727 m)   Wt 242 lb (109.8 kg)   SpO2 95%   BMI 36.80 kg/m   Physical Exam  Constitutional: He is oriented to person, place, and time. He appears well-developed and well-nourished. No distress.  HENT:  Head: Normocephalic and atraumatic.  Mouth/Throat: Oropharynx is clear and moist.  Mucous membranes are dry.  Eyes: Conjunctivae and EOM are normal. Pupils are equal, round, and reactive to light.  Neck: Normal range of motion. Neck supple.  Cardiovascular: Normal rate and intact distal pulses.  An irregularly irregular rhythm present.  No murmur heard. Pulmonary/Chest: Effort normal. Tachypnea noted. No respiratory distress. He has wheezes. He has no rales.  Pacemaker present in the left upper chest  Abdominal: Soft. He exhibits no distension. There is no tenderness. There is no rebound and no guarding.  Musculoskeletal: Normal range of motion. He exhibits edema. He exhibits no tenderness.  Trace edema bilaterally  Neurological: He is alert and oriented to person, place, and time.  Skin: Skin is warm and dry. No rash noted. No erythema.  Psychiatric: He has a normal mood and affect. His behavior is normal.  Nursing note and vitals reviewed.    ED Treatments / Results  Labs (all labs ordered are listed, but only abnormal results are displayed) Labs Reviewed  BASIC METABOLIC PANEL - Abnormal; Notable for the following:       Result Value   Chloride 98 (*)    Glucose, Bld 51 (*)    BUN 21 (*)    Creatinine, Ser 1.48 (*)    Calcium 8.8 (*)    GFR calc non Af Amer 43 (*)    GFR calc Af Amer 50 (*)    All other components within normal limits  CBC - Abnormal; Notable for the following:    WBC 13.8 (*)    Hemoglobin 12.9 (*)    RDW 25.7 (*)    All other components within normal limits  CULTURE, BLOOD (ROUTINE X 2)  CULTURE, BLOOD (ROUTINE X 2)  URINE CULTURE  URINALYSIS, ROUTINE W REFLEX MICROSCOPIC (NOT AT [facility])  INFLUENZA PANEL BY PCR (TYPE A & B, H1N1)  I-STAT TROPOININ, ED  I-STAT CG4 LACTIC ACID, ED    EKG  EKG Interpretation  Date/Time:  Friday September 03 2016 15:59:46 EST Ventricular Rate:  73 PR Interval:  198 QRS  Duration: 146 QT Interval:  484 QTC Calculation: 533 R Axis:   64 Text Interpretation:  Atrial-paced rhythm with occasional AV  dual-paced complexes and Premature ventricular complexes or Fusion complexes Non-specific intra-ventricular conduction block Lateral infarct , age undetermined Inferior infarct , age undetermined T wave abnormality, consider anterior ischemia Confirmed by Maryan Rued  MD, Maisy Newport ([id number]) on 09/03/2016 4:33:58 PM       Radiology Dg Chest Portable 1 View  Result Date: 09/03/2016 CLINICAL DATA:  Intermittent chest pain and shortness of breath for 1 week. EXAM: PORTABLE CHEST 1 VIEW COMPARISON:  06/12/2016 FINDINGS: The pacer wires/AICD are stable. Stable cardiac enlargement and tortuous calcified thoracic aorta. Significant chronic lung changes with chronic right pleural thickening and remote right rib trauma. No definite acute overlying pulmonary process. No evidence of CHF. IMPRESSION: Cardiac enlargement and chronic lung changes. No definite acute pulmonary process. Electronically Signed   By: Marijo Sanes M.D.   On: 09/03/2016 16:22    Procedures Procedures (including critical care time)  Medications Ordered in ED Medications  0.9 %  sodium chloride infusion (not administered)  cefTRIAXone (ROCEPHIN) 1 g in dextrose 5 % 50 mL IVPB (not administered)  azithromycin (ZITHROMAX) 500 mg in dextrose 5 % 250 mL IVPB (not administered)     Initial Impression / Assessment and Plan / ED Course  I have reviewed the triage vital signs and the nursing notes.  Pertinent labs & imaging results that were available during my care of the patient were reviewed by me and considered in my medical decision making (see chart for details).  Clinical Course    Patient is an 82 year old male with multiple medical problems presenting today with URI symptoms for the last 3 days including dry cough, fever and shortness of breath. Patient is wheezing on exam and seen by his PCP today and found to be hypoxic with an oxygen saturation of 85% on room air. Patient does not usually wear oxygen at home. With nasal cannula  2 L his O2 sat was 91%.  Patient's blood pressure has been in the 120s over 70s. Blood pressure in his left arm is inaccurate per the patient and his wife. When checked on the right side it remains in the [id number] systolically. Code sepsis initiated as patient has had fever, tachycardia and hypoxia. Lactate, CBC, CMP, blood cultures, UA, urine cultures pending. Chest x-ray shows no sign of infiltrate however given his cough, wheezing and fever most likely respiratory in nature. He has no signs of encephalopathy or urinary complaints. No signs of cellulitis. We'll start on 125 mL's of fluid as patient does have a history of CHF. Will wait lactate. He was also covered with Rocephin and azithromycin for community-acquired pneumonia. Flu testing also done.  5:30 PM Labs are consistent with a leukocytosis of 13,000, stable hemoglobin and stable renal function. Patient is hypoglycemic at 82 and was given food. Mental status remains normal. After albuterol and Atrovent mild worsening of wheezing. He will need ongoing treatments and was also treated with Solu-Medrol. Patient was also given antibiotics for community-acquired pneumonia. Feel that that is most likely his source. No signs of fluid overload at this time. Troponin is 0.04 and lactate is normal. Will admit for further care. Final Clinical Impressions(s) / ED Diagnoses   Final diagnoses:  COPD exacerbation ([place])  Bronchitis    New Prescriptions New Prescriptions   No medications on file     Blanchie Dessert, MD 09/03/16 1731

## 2016-09-03 NOTE — ED Notes (Signed)
Spoke with pharmacy regarding verifying lasix to obtain in pyxis.

## 2016-09-03 NOTE — Progress Notes (Signed)
ANTICOAGULATION CONSULT NOTE - Initial Consult  Pharmacy Consult:  Coumadin Indication: atrial fibrillation  Allergies  Allergen Reactions  . Aldactone [Spironolactone] Other (See Comments)    Hyperkalemia  reported by Dr. [name] - pt is currently taking 12.5 mg daily -November 2017 per medication list by same MD    Patient Measurements: Height: 5\' 8"  (172.7 cm) Weight: 242 lb (109.8 kg) IBW/kg (Calculated) : 68.4  Vital Signs: Temp: 98.1 F (36.7 C) (11/10 1603) Temp Source: Oral (11/10 1603) BP: 149/72 (11/10 1830) Pulse Rate: 78 (11/10 1830)  Labs:  Recent Labs  09/03/16 1630 09/03/16 1817  HGB 12.9*  --   HCT 40.5  --   PLT 204  --   LABPROT  --  31.6*  INR  --  2.98  CREATININE 1.48*  --     Estimated Creatinine Clearance: 47.9 mL/min (by C-G formula based on SCr of 1.48 mg/dL (H)).   Medical History: Past Medical History:  Diagnosis Date  . Atrial fibrillation ([place])    persistent, previously seen at [facility] and placed on amiodarone  . Benign prostatic hypertrophy   . CAD (coronary artery disease)    multivessel s/p inferolateral wall MI with subsequent CABG 11/1998.  Cath 2009 with Patent grafts  . Cleft palate   . COPD with emphysema ([place]) 04/01/2010  . DM (diabetes mellitus), type 2 ([place])   . Dyspnea   . GERD (gastroesophageal reflux disease)   . HTN (hypertension)   . Hyperlipidemia   . Hypothyroidism   . Iron deficiency anemia   . Ischemic dilated cardiomyopathy ([place])    EF 35-40% by MUGA 6/11  . Myocardial infarction   . Nasal septal deviation   . Nephrolithiasis   . OSA (obstructive sleep apnea)   . PAF (paroxysmal atrial fibrillation) ([place])   . Peripheral arterial disease (HCC)    left subclavian artery stenosis  . PNA (pneumonia)   . Psoriasis   . Seborrheic keratosis   . Stroke ([place])   . Systolic congestive heart failure ([place]) 2009   s/p BiV ICD implantation by Dr Leonia Reeves (MDT)     Assessment: 57 YOM to continue Coumadin from  PTA for history of Afib.  INR therapeutic and toward the high end of normal.  No bleeding reported.  Patient is started on azithromycin and Rocephin for CAP.   Goal of Therapy:  INR 2-3   Plan:  - Coumadin 4mg  PO today - Daily PT / INR   Keyaria Lawson D. Mina Marble, PharmD, BCPS Pager:  [phone] 09/03/2016, 7:06 PM

## 2016-09-03 NOTE — ED Notes (Signed)
Patient states feel better however upper airway congestion still heard at bedside. Airway intact bilateral equal chest rise and fall.

## 2016-09-03 NOTE — H&P (Signed)
History and Physical    [name] [id number] DOB: 07/17/1934 DOA: 09/03/2016  PCP: Irven Shelling, MD Patient coming from: Home   Chief Complaint: hypoxemia, fever, SOB.   HPI: [name] is a 82 y.o. male with medical history significant of Ischemic cardiomyopathy EF 35 -40 %, A fib on coumadin, COPD, not on home oxygen who presents with SOB , hypoxemia. He relates that Wednesday night he didn't feel well, experience chills. The following day he had ear pain, fever. The day of admission he started to have SOB, cough, productive. He is still feeling SOB. He reports chest pain, after coughing. He went to see his PCP today, patient was found to be hypoxic, Oxygen Sat 85 RA. Improved to 95 on 2 l.   ED Course: Oxygen in the 80 % improved on 2 L, Cr 1.48, glucose 51, WBC 13, Lactic acid 1.3, chest x ray: Cardiac enlargement and chronic lung changes. No definite acute pulmonary process. Initial BP was low at 85 , but was check on his arm that he has PVD. Repeated BP different arm was normal.  Patient received nebulizer, solumedrol, IV antibiotics in the ED.   Review of Systems: As per HPI otherwise 10 point review of systems negative.    Past Medical History:  Diagnosis Date  . Atrial fibrillation ([place])    persistent, previously seen at [facility] and placed on amiodarone  . Benign prostatic hypertrophy   . CAD (coronary artery disease)    multivessel s/p inferolateral wall MI with subsequent CABG 11/1998.  Cath 2009 with Patent grafts  . Cleft palate   . COPD with emphysema ([place]) 04/01/2010  . DM (diabetes mellitus), type 2 ([place])   . Dyspnea   . GERD (gastroesophageal reflux disease)   . HTN (hypertension)   . Hyperlipidemia   . Hypothyroidism   . Iron deficiency anemia   . Ischemic dilated cardiomyopathy ([place])    EF 35-40% by MUGA 6/11  . Myocardial infarction   . Nasal septal deviation   . Nephrolithiasis   . OSA (obstructive sleep apnea)   . PAF (paroxysmal  atrial fibrillation) ([place])   . Peripheral arterial disease (HCC)    left subclavian artery stenosis  . PNA (pneumonia)   . Psoriasis   . Seborrheic keratosis   . Stroke ([place])   . Systolic congestive heart failure ([place]) 2009   s/p BiV ICD implantation by Dr Leonia Reeves (MDT)    Past Surgical History:  Procedure Laterality Date  . BI-VENTRICULAR IMPLANTABLE CARDIOVERTER DEFIBRILLATOR  (CRT-D)  10-08-08; 11-06-2013   Dr Leonia Reeves (MDT) implant for primary prevention; gen change to MDT VivaXT CRTD by Dr Rayann Heman  . BIV ICD GENERTAOR CHANGE OUT N/A 11/06/2013   Procedure: BIV ICD GENERTAOR CHANGE OUT;  Surgeon: Coralyn Mark, MD;  Location[facility] CATH LAB;  Service: Cardiovascular;  Laterality: N/A;  . c-spine surgery    . CARDIOVERSION N/A 04/29/2016   Procedure: CARDIOVERSION;  Surgeon: Pixie Casino, MD;  Location: [facility] ENDOSCOPY;  Service: Cardiovascular;  Laterality: N/A;  . CARPAL TUNNEL RELEASE    . CATARACT EXTRACTION    . CORONARY ARTERY BYPASS GRAFT     LIMA to LAD, SVG to OM, SVG to diagonal  . ELECTROPHYSIOLOGIC STUDY N/A 06/11/2016   Procedure: Atrial Fibrillation Ablation;  Surgeon: Thompson Grayer, MD;  Location: [facility] CV LAB;  Service: Cardiovascular;  Laterality: N/A;  . left cleft palate and left cleft lip repair       reports that  he quit smoking about 17 years ago. His smoking use included Cigarettes. He has a 195.00 pack-year smoking history. He has never used smokeless tobacco. He reports that he does not drink alcohol or use drugs.  Allergies  Allergen Reactions  . Aldactone [Spironolactone] Other (See Comments)    Hyperkalemia  reported by Dr. [name] - pt is currently taking 12.5 mg daily -November 2017 per medication list by same MD    Family History  Problem Relation Age of Onset  . Asthma Father   . Stroke Father   . Hypertension Father   . Hypertension Mother   . Heart disease Brother   . Heart attack Brother   . Hypertension Sister   . Hypertension  Brother     Prior to Admission medications   Medication Sig Start Date End Date Taking? Authorizing Provider  acetaminophen (TYLENOL) 500 MG tablet Take 1,000 mg by mouth See admin instructions. Take 2 tablets (1000 mg) by mouth daily at bedtime, may also take 2 tablets every 6 hours as needed for pain/fever   Yes Historical Provider, MD  albuterol (PROVENTIL HFA;VENTOLIN HFA) 108 (90 BASE) MCG/ACT inhaler Inhale 2 puffs into the lungs every 6 (six) hours as needed for wheezing or shortness of breath. Must keep June 2016 appt. Patient taking differently: Inhale 2 puffs into the lungs every 4 (four) hours as needed for wheezing or shortness of breath. Must keep June 2016 appt. 03/04/15  Yes Chesley Mires, MD  albuterol (PROVENTIL) (2.5 MG/3ML) 0.083% nebulizer solution USE 1 VIAL VIA NEBULIZER 4 TIMES A DAY AS DIRECTED 07/05/16  Yes Chesley Mires, MD  amiodarone (PACERONE) 200 MG tablet Take 200 mg by mouth daily.   Yes Historical Provider, MD  aspirin EC 81 MG tablet Take 81 mg by mouth daily.   Yes Historical Provider, MD  atorvastatin (LIPITOR) 80 MG tablet TAKE 1 TABLET (80 MG TOTAL) BY MOUTH DAILY. 02/05/16  Yes Sueanne Margarita, MD  carvedilol (COREG) 6.25 MG tablet TAKE 1 TABLET BY MOUTH TWICE A DAY 02/05/16  Yes Sueanne Margarita, MD  DULERA 100-5 MCG/ACT AERO TAKE 2 PUFFS BY MOUTH TWICE A DAY 12/12/15  Yes Chesley Mires, MD  ezetimibe (ZETIA) 10 MG tablet Take 10 mg by mouth daily.   Yes Historical Provider, MD  ferrous sulfate 325 (65 FE) MG tablet Take 325 mg by mouth daily with breakfast.   Yes Historical Provider, MD  fluticasone (FLONASE) 50 MCG/ACT nasal spray Place 2 sprays into both nostrils daily. 06/17/16  Yes Chesley Mires, MD  furosemide (LASIX) 40 MG tablet Take 40 mg by mouth 2 (two) times daily.   Yes Historical Provider, MD  insulin aspart (NOVOLOG FLEXPEN) 100 UNIT/ML FlexPen Inject 6 Units into the skin 3 (three) times daily before meals.   Yes Historical Provider, MD  insulin degludec  (TRESIBA FLEXTOUCH) 100 UNIT/ML SOPN FlexTouch Pen Inject 50 Units into the skin daily before lunch.   Yes Historical Provider, MD  levothyroxine (SYNTHROID, LEVOTHROID) 100 MCG tablet Take 100 mcg by mouth daily before breakfast.  09/23/15  Yes Historical Provider, MD  Multiple Vitamin (MULTIVITAMIN WITH MINERALS) TABS tablet Take 1 tablet by mouth daily.   Yes Historical Provider, MD  omeprazole (PRILOSEC) 20 MG capsule Take 20 mg by mouth daily.     Yes Historical Provider, MD  PRESCRIPTION MEDICATION Inhale into the lungs at bedtime. CPAP   Yes Historical Provider, MD  senna (SENOKOT) 8.6 MG tablet Take 2 tablets by mouth at bedtime.  Yes Historical Provider, MD  spironolactone (ALDACTONE) 25 MG tablet TAKE 1/2 TABLET (12.5 MG TOTAL) BY MOUTH DAILY. 07/12/16  Yes Jettie Booze, MD  Tiotropium Bromide Monohydrate (SPIRIVA RESPIMAT) 2.5 MCG/ACT AERS Inhale 2 puffs into the lungs daily. 03/29/16  Yes Chesley Mires, MD  vitamin B-12 (CYANOCOBALAMIN) 1000 MCG tablet Take 1,000 mcg by mouth daily.   Yes Historical Provider, MD  warfarin (COUMADIN) 5 MG tablet TAKE AS DIRECTED BY COUMADIN CLINIC Patient taking differently: TAKE 1/2 TABLET BY MOUTH ON MONDAY NIGHT, TAKE 1 TABLET ON ALL OTHER NIGHTS OF THE WEEK OR  AS DIRECTED BY COUMADIN CLINIC 08/09/16  Yes Jettie Booze, MD    Physical Exam: Vitals:   09/03/16 1615 09/03/16 1635 09/03/16 1645 09/03/16 1700  BP:  130/62 121/60 (!) 110/48  Pulse:  69 70 69  Resp:  24 26 26   Temp:      TempSrc:      SpO2: 98% 95% 96% 95%  Weight:      Height:          Constitutional: NAD, calm, comfortable Vitals:   09/03/16 1615 09/03/16 1635 09/03/16 1645 09/03/16 1700  BP:  130/62 121/60 (!) 110/48  Pulse:  69 70 69  Resp:  24 26 26   Temp:      TempSrc:      SpO2: 98% 95% 96% 95%  Weight:      Height:       Eyes: PERRL, lids and conjunctivae normal ENMT: Mucous membranes are moist. Posterior pharynx clear of any exudate or  lesions. Neck: normal, supple, no masses, no thyromegaly Respiratory: Tachypnea, crackles and ronchus bilaterally lungs and upper airway.  Cardiovascular: Regular rate and rhythm, no murmurs / rubs / gallops. No extremity edema. 2+ pedal pulses. No carotid bruits.  Abdomen: no tenderness, no masses palpated. No hepatosplenomegaly. Bowel sounds positive. Distended. Musculoskeletal: no clubbing / cyanosis. No joint deformity upper and lower extremities. Good ROM, no contractures. Normal muscle tone. Trace edema.  Skin: no rashes, lesions, ulcers. No induration Neurologic: CN 2-12 grossly intact. Sensation intact, DTR normal. Strength 5/5 in all 4.  Psychiatric: Normal judgment and insight. Alert and oriented x 3. Normal mood.     Labs on Admission: I have personally reviewed following labs and imaging studies  CBC:  Recent Labs Lab 09/03/16 1630  WBC 13.8*  HGB 12.9*  HCT 40.5  MCV 82.8  PLT 0000000   Basic Metabolic Panel:  Recent Labs Lab 09/03/16 1630  NA 135  K 3.9  CL 98*  CO2 28  GLUCOSE 51*  BUN 21*  CREATININE 1.48*  CALCIUM 8.8*   GFR: Estimated Creatinine Clearance: 47.9 mL/min (by C-G formula based on SCr of 1.48 mg/dL (H)). Liver Function Tests: No results for input(s): AST, ALT, ALKPHOS, BILITOT, PROT, ALBUMIN in the last 168 hours. No results for input(s): LIPASE, AMYLASE in the last 168 hours. No results for input(s): AMMONIA in the last 168 hours. Coagulation Profile: No results for input(s): INR, PROTIME in the last 168 hours. Cardiac Enzymes: No results for input(s): CKTOTAL, CKMB, CKMBINDEX, TROPONINI in the last 168 hours. BNP (last 3 results) No results for input(s): PROBNP in the last 8760 hours. HbA1C: No results for input(s): HGBA1C in the last 72 hours. CBG: No results for input(s): GLUCAP in the last 168 hours. Lipid Profile: No results for input(s): CHOL, HDL, LDLCALC, TRIG, CHOLHDL, LDLDIRECT in the last 72 hours. Thyroid Function  Tests: No results for input(s): TSH, T4TOTAL, FREET4,  T3FREE, THYROIDAB in the last 72 hours. Anemia Panel: No results for input(s): VITAMINB12, FOLATE, FERRITIN, TIBC, IRON, RETICCTPCT in the last 72 hours. Urine analysis:    Component Value Date/Time   COLORURINE YELLOW 11/28/2008 0325   APPEARANCEUR CLEAR 11/28/2008 0325   LABSPEC 1.023 11/28/2008 0325   PHURINE 5.5 11/28/2008 0325   GLUCOSEU NEGATIVE 11/28/2008 0325   HGBUR NEGATIVE 11/28/2008 0325   BILIRUBINUR NEGATIVE 11/28/2008 0325   KETONESUR NEGATIVE 11/28/2008 0325   PROTEINUR 30 (A) 11/28/2008 0325   UROBILINOGEN 0.2 11/28/2008 0325   NITRITE NEGATIVE 11/28/2008 0325   LEUKOCYTESUR NEGATIVE 11/28/2008 0325   Sepsis Labs: !!!!!!!!!!!!!!!!!!!!!!!!!!!!!!!!!!!!!!!!!!!! @LABRCNTIP (procalcitonin:4,lacticidven:4) )No results found for this or any previous visit (from the past 240 hour(s)).   Radiological Exams on Admission: Dg Chest Portable 1 View  Result Date: 09/03/2016 CLINICAL DATA:  Intermittent chest pain and shortness of breath for 1 week. EXAM: PORTABLE CHEST 1 VIEW COMPARISON:  06/12/2016 FINDINGS: The pacer wires/AICD are stable. Stable cardiac enlargement and tortuous calcified thoracic aorta. Significant chronic lung changes with chronic right pleural thickening and remote right rib trauma. No definite acute overlying pulmonary process. No evidence of CHF. IMPRESSION: Cardiac enlargement and chronic lung changes. No definite acute pulmonary process. Electronically Signed   By: Marijo Sanes M.D.   On: 09/03/2016 16:22    EKG: Independently reviewed. Atrial pace rhythm.   Assessment/Plan Active Problems:   Essential hypertension   Automatic implantable cardioverter-defibrillator in situ   Long term current use of anticoagulant therapy   Ischemic dilated cardiomyopathy (HCC)   PAF (paroxysmal atrial fibrillation) (HCC)   Acute respiratory failure with hypoxia (HCC)   Acute renal failure superimposed on stage  3 chronic kidney disease (HCC)   Diabetes mellitus with diabetic neuropathy, with long-term current use of insulin ([place])   Community acquired pneumonia  1-Acute hypoxic Respiratory Failure:  Multifactorial, clinically he has PNA, with history of cough, fever although chest x ray didn't showed florid PNA. Will continue with IV ceftriaxone, and Azithromycin. Also in the differential COPD exacerbation, HF exacerbation.  Follow Blood culture.  Admit to step down unit, high risk for decompensation.  Continue with Nebulizer, Solumedrol IV BID.  Patient tachypnic . BP stable, lactic acid normal. He has significant crackles, ronchus. Will give one time dose of IV lasix. Resume home dose in AM. Might Need IV lasix in am depending on respiratory status and BP.  Check BNP.    2-PNA; presents with cough, history of fever.  Monitor BP, lactic acid, in case patient develops sepsis.   3-Acute on chronic Systolic HF;  Check BNP.  Will give one time of IV lasix. Resume oral lasix in am. Monitor BP or further development of sepsis.  Hold spironolactone, resume if BP remain stable.  Continue with coreg.   4-DM; hypoglycemia;  Hold Insulin Long acting.  He will be on solumedrol , suspect Blood sugar will increase. Will order SSI.   Afib; continue with coumadin, pharmacy to dose. On coreg.   CKD stage III; last cr per records 1.6---1.5 Stable.   DVT prophylaxis: On coumadin.  Code Status: Full code, discussed with patient and wife.  Family Communication: care discussed with wife.  Disposition Plan:  Consults called: none Admission status: Inpatient telemetry, patient with multiples co-morbidities and presents with hypoxic Respiratory failure and infectious process.    Elmarie Shiley MD [facility] Pager [phone]  If 7PM-7AM, please contact night-coverage [email or web address] Password [email or web address]  09/03/2016, 5:48 PM

## 2016-09-03 NOTE — ED Notes (Signed)
Food tray delivered to patient.

## 2016-09-03 NOTE — ED Notes (Signed)
Peanut butter, crackers, and soda given to patient by Doctor.

## 2016-09-03 NOTE — ED Notes (Signed)
Doctor at bedside.

## 2016-09-03 NOTE — Progress Notes (Addendum)
Pharmacy Antibiotic Note  ZYQUEZ SCARFONE is a 82 y.o. male admitted on 09/03/2016 with SOB, intermittent chest pain and dyspnea at rest.  Pharmacy has been consulted for azithromycin and ceftriaxone dosing for CAP.  First doses of antibiotics are already ordered.  Plan: - CTX 1gm IV Q24H - Azith 500mg  IV Q24H - Pharmacy will sign off as dosage adjustment is unnecessary.  Thank you for the consult!  Height: 5\' 8"  (172.7 cm) Weight: 242 lb (109.8 kg) IBW/kg (Calculated) : 68.4  Temp (24hrs), Avg:98.1 F (36.7 C), Min:98.1 F (36.7 C), Max:98.1 F (36.7 C)  No results for input(s): WBC, CREATININE, LATICACIDVEN, VANCOTROUGH, VANCOPEAK, VANCORANDOM, GENTTROUGH, GENTPEAK, GENTRANDOM, TOBRATROUGH, TOBRAPEAK, TOBRARND, AMIKACINPEAK, AMIKACINTROU, AMIKACIN in the last 168 hours.  CrCl cannot be calculated (Patient's most recent lab result is older than the maximum 21 days allowed.).    No Known Allergies  Antimicrobials this admission: Azith 11/10 >> CTX 11/10 >>  Dose adjustments this admission: N/A  Microbiology results: 11/10 BCx x2 - 11/10 UCx -   Remigio Eisenmenger D. Mina Marble, PharmD, BCPS Pager:  [phone] 09/03/2016, 4:40 PM   ========================  Addendum: - now a code sepsis, therefore, increase CTX to 2gm IV Q24H.    Georgianne Gritz D. Mina Marble, PharmD, BCPS Pager:  [phone] 09/03/2016, 4:44 PM

## 2016-09-03 NOTE — ED Notes (Signed)
Patient and wife states left arm history of left arm circulation problems and not to take blood pressure on left arm.

## 2016-09-03 NOTE — ED Notes (Signed)
Admit Doctor will change bed assignment from telemetry to step down.

## 2016-09-03 NOTE — ED Triage Notes (Signed)
Pt states sob x 1 week with intermittent chest pain.  Sats of 91% on RA, dyspnea at rest.  States he came in today b/c his wife made him.

## 2016-09-04 ENCOUNTER — Other Ambulatory Visit: Payer: Self-pay | Admitting: Pulmonary Disease

## 2016-09-04 LAB — BASIC METABOLIC PANEL
Anion gap: 8 (ref 5–15)
BUN: 21 mg/dL — ABNORMAL HIGH (ref 6–20)
CO2: 26 mmol/L (ref 22–32)
Calcium: 8.5 mg/dL — ABNORMAL LOW (ref 8.9–10.3)
Chloride: 102 mmol/L (ref 101–111)
Creatinine, Ser: 1.27 mg/dL — ABNORMAL HIGH (ref 0.61–1.24)
GFR calc Af Amer: 60 mL/min — ABNORMAL LOW (ref >60)
GFR calc non Af Amer: 52 mL/min — ABNORMAL LOW (ref >60)
Glucose, Bld: 222 mg/dL — ABNORMAL HIGH (ref 65–99)
Potassium: 4.1 mmol/L (ref 3.5–5.1)
Sodium: 136 mmol/L (ref 135–145)

## 2016-09-04 LAB — GLUCOSE, CAPILLARY
Glucose-Capillary: 223 mg/dL — ABNORMAL HIGH (ref 65–99)
Glucose-Capillary: 261 mg/dL — ABNORMAL HIGH (ref 65–99)
Glucose-Capillary: 277 mg/dL — ABNORMAL HIGH (ref 65–99)
Glucose-Capillary: 318 mg/dL — ABNORMAL HIGH (ref 65–99)

## 2016-09-04 LAB — CBC
HCT: 36.3 % — ABNORMAL LOW (ref 39.0–52.0)
Hemoglobin: 11.4 g/dL — ABNORMAL LOW (ref 13.0–17.0)
MCH: 25.9 pg — ABNORMAL LOW (ref 26.0–34.0)
MCHC: 31.4 g/dL (ref 30.0–36.0)
MCV: 82.5 fL (ref 78.0–100.0)
Platelets: 163 10*3/uL (ref 150–400)
RBC: 4.4 MIL/uL (ref 4.22–5.81)
RDW: 25.6 % — ABNORMAL HIGH (ref 11.5–15.5)
WBC: 9.7 10*3/uL (ref 4.0–10.5)

## 2016-09-04 LAB — PROTIME-INR
INR: 2.58
Prothrombin Time: 28.2 seconds — ABNORMAL HIGH (ref 11.4–15.2)

## 2016-09-04 LAB — LACTIC ACID, PLASMA: Lactic Acid, Venous: 0.8 mmol/L (ref 0.5–1.9)

## 2016-09-04 MED ORDER — INSULIN ASPART 100 UNIT/ML ~~LOC~~ SOLN
0.0000 [IU] | Freq: Every day | SUBCUTANEOUS | Status: DC
  Administered 2016-09-04: 1 [IU] via SUBCUTANEOUS
  Administered 2016-09-05: 3 [IU] via SUBCUTANEOUS

## 2016-09-04 MED ORDER — INSULIN ASPART 100 UNIT/ML ~~LOC~~ SOLN
0.0000 [IU] | Freq: Three times a day (TID) | SUBCUTANEOUS | Status: DC
Start: 2016-09-05 — End: 2016-09-06
  Administered 2016-09-05: 5 [IU] via SUBCUTANEOUS
  Administered 2016-09-05: 8 [IU] via SUBCUTANEOUS
  Administered 2016-09-05 – 2016-09-06 (×3): 5 [IU] via SUBCUTANEOUS

## 2016-09-04 MED ORDER — METHYLPREDNISOLONE SODIUM SUCC 40 MG IJ SOLR: 40.0000 mg | INTRAMUSCULAR | Status: DC

## 2016-09-04 MED ORDER — INSULIN GLARGINE 100 UNIT/ML ~~LOC~~ SOLN
15.0000 [IU] | Freq: Every day | SUBCUTANEOUS | Status: DC
  Administered 2016-09-04: 15 [IU] via SUBCUTANEOUS
  Filled 2016-09-04 (×2): qty 0.15

## 2016-09-04 MED ORDER — WARFARIN SODIUM 5 MG PO TABS
5.0000 mg | ORAL_TABLET | Freq: Once | ORAL | Status: AC
  Administered 2016-09-04: 5 mg via ORAL
  Filled 2016-09-04: qty 1

## 2016-09-04 MED ORDER — IPRATROPIUM-ALBUTEROL 0.5-2.5 (3) MG/3ML IN SOLN
3.0000 mL | Freq: Four times a day (QID) | RESPIRATORY_TRACT | Status: DC
  Administered 2016-09-04 (×4): 3 mL via RESPIRATORY_TRACT
  Filled 2016-09-04 (×3): qty 3

## 2016-09-04 NOTE — Discharge Instructions (Signed)

## 2016-09-04 NOTE — Progress Notes (Signed)
ANTICOAGULATION CONSULT NOTE - Follow Up Consult  Pharmacy Consult for warfarin Indication: atrial fibrillation  Allergies  Allergen Reactions  . Aldactone [Spironolactone] Other (See Comments)    Hyperkalemia  reported by Dr. [name] - pt is currently taking 12.5 mg daily -November 2017 per medication list by same MD    Patient Measurements: Height: 5\' 8"  (172.7 cm) Weight: 240 lb 8.4 oz (109.1 kg) IBW/kg (Calculated) : 68.4  Vital Signs: Temp: 97.5 F (36.4 C) (11/11 0728) Temp Source: Oral (11/11 0728) BP: 131/68 (11/11 0800) Pulse Rate: 70 (11/11 0800)  Labs:  Recent Labs  09/03/16 1630 09/03/16 1817 09/04/16 0304  HGB 12.9*  --  11.4*  HCT 40.5  --  36.3*  PLT 204  --  163  LABPROT  --  31.6* 28.2*  INR  --  2.98 2.58  CREATININE 1.48*  --  1.27*    Estimated Creatinine Clearance: 55.6 mL/min (by C-G formula based on SCr of 1.27 mg/dL (H)).  Assessment:  78 YOM to continue Coumadin from PTA for history of Afib. INR today 2.58. CBC stable.   PTA dose of warfarin: 5mg  daily except 2.5mg  on Mon  Goal of Therapy:  INR 2-3 Monitor platelets by anticoagulation protocol: Yes   Plan:  Warfarin 5mg  po x 1 tonight Daily INR Monitor CBC and for S&S of bleed  Angela Burke, PharmD Pharmacy Resident Pager: [phone] 09/04/2016,10:19 AM

## 2016-09-04 NOTE — Progress Notes (Signed)
Nutrition Brief Note  Patient identified on the Malnutrition Screening Tool (MST) Report  Wt Readings from Last 15 Encounters:  09/03/16 240 lb 8.4 oz (109.1 kg)  08/16/16 243 lb 6.4 oz (110.4 kg)  08/04/16 241 lb 12.8 oz (109.7 kg)  07/20/16 249 lb (112.9 kg)  07/09/16 252 lb 9.6 oz (114.6 kg)  06/24/16 255 lb (115.7 kg)  06/17/16 265 lb 9.6 oz (120.5 kg)  06/11/16 264 lb 15.9 oz (120.2 kg)  06/04/16 256 lb (116.1 kg)  05/24/16 257 lb (116.6 kg)  05/05/16 257 lb (116.6 kg)  04/29/16 253 lb (114.8 kg)  04/21/16 257 lb 12.8 oz (116.9 kg)  03/29/16 259 lb (117.5 kg)  03/12/16 257 lb (116.6 kg)   [name] is a 82 y.o. male with medical history significant of Ischemic cardiomyopathy EF 35 -40 %, A fib on coumadin, COPD, not on home oxygen who presents with SOB , hypoxemia. He relates that Wednesday night he didn't feel well, experience chills. The following day he had ear pain, fever. The day of admission he started to have SOB, cough, productive. He is still feeling SOB. He reports chest pain, after coughing. He went to see his PCP today, patient was found to be hypoxic, Oxygen Sat 85 RA. Improved to 95 on 2 l.   Pt admitted with acute hypoxic respiratory failure.   Spoke with pt at bedside, who reports poor appetite over the past 2-3 weeks, lacking the desire to eat. Pt typically consumes 3 meals per day, but shares he has been eating less over this time period. Pt also estimates that he has lost 24# within the past month, however, this is not consistent with wt hx. Pt also reports his wt fluctuates related to diuretic use.   Pt shares that he ate well at breakfast (approcimately 90% of tray) and would have eaten all of it "if the doctor told me not to stop eating". Pt reports that he is NPO for a procedure.   Nutrition-Focused physical exam completed. Findings are no fat depletion, mild muscle depletion, and mild edema.    Body mass index is 36.57 kg/m. Patient meets  criteria for obesity, class II based on current BMI.   Current diet order is Carb Modified, patient is consuming approximately 85% of meals at this time. Labs and medications reviewed.   No nutrition interventions warranted at this time. If nutrition issues arise, please consult RD.   Bilal Manzer A. Jimmye Norman, RD, LDN, CDE Pager: [phone] After hours Pager: [phone]

## 2016-09-04 NOTE — Progress Notes (Signed)
PROGRESS NOTE    [name]  [id number] DOB: 1934-06-24 DOA: 09/03/2016 PCP: Irven Shelling, MD    Brief Narrative: [name] is a 82 y.o. male with medical history significant of Ischemic cardiomyopathy EF 35 -40 %, A fib on coumadin, COPD, not on home oxygen who presents with SOB , hypoxemia. He relates that Wednesday night he didn't feel well, experience chills. The following day he had ear pain, fever. The day of admission he started to have SOB, cough, productive. He is still feeling SOB. He reports chest pain, after coughing. He went to see his PCP today, patient was found to be hypoxic, Oxygen Sat 85 RA. Improved to 95 on 2 l.   ED Course: Oxygen in the 80 % improved on 2 L, Cr 1.48, glucose 51, WBC 13, Lactic acid 1.3, chest x ray: Cardiac enlargement and chronic lung changes. No definite acute pulmonary process. Initial BP was low at 85 , but was check on his arm that he has PVD. Repeated BP different arm was normal.  Patient received nebulizer, solumedrol, IV antibiotics in the ED.   Assessment & Plan:   Principal Problem:   Community acquired pneumonia Active Problems:   Essential hypertension   Automatic implantable cardioverter-defibrillator in situ   Long term current use of anticoagulant therapy   Ischemic dilated cardiomyopathy (HCC)   PAF (paroxysmal atrial fibrillation) (HCC)   Acute respiratory failure with hypoxia (HCC)   Acute renal failure superimposed on stage 3 chronic kidney disease (HCC)   Diabetes mellitus with diabetic neuropathy, with long-term current use of insulin (HCC)   Acute respiratory failure (HCC)  1-Acute hypoxic Respiratory Failure:  Multifactorial, clinically he has PNA, with history of cough, fever although chest x ray didn't showed florid PNA. Also in the differential COPD exacerbation, HF exacerbation.  continue with IV ceftriaxone, and Azithromycin.  Continue with Nebulizer, Solumedrol IV change to daily. Blood  culture.  Home oral lasix.   2-PNA; presents with cough, history of fever.  Monitor BP, lactic acid, in case patient develops sepsis.  Continue with ceftriaxone.   3-Acute on chronic Systolic HF;  BNP Q000111Q. Negative 1.5 Received one time dose of  IV lasix.  Resume  Home oral lasix Hold spironolactone, resume if BP remain stable.  Continue with coreg.   4-DM; hypoglycemia;  Hypoglycemia resolved.  SSI.  Will resume long acting insulin, lower dose.   Afib;  continue with coumadin, pharmacy to dose. Continue with  coreg.   CKD stage III; last cr per records 1.6---1.5 Stable.      DVT prophylaxis: On coumadin  Code Status: Full code.  Family Communication: care discussed with patient  Disposition Plan: remain inpatient.  Consultants:   none  Procedures:  None  Antimicrobials:  Ceftriaxone 11-10 Azithromycin 11-10  Subjective: He is feeling better. Dyspnea has improved.  Was coughing after eating breakfast today.   Objective: Vitals:   09/04/16 0100 09/04/16 0200 09/04/16 0356 09/04/16 0400  BP: (!) 109/54 137/71 125/62 128/69  Pulse: 70 74 70 77  Resp: 20 (!) 21 18 (!) 23  Temp:   97.7 F (36.5 C)   TempSrc:   Oral   SpO2: 98% 96% 100% 100%  Weight:      Height:        Intake/Output Summary (Last 24 hours) at 09/04/16 0718 Last data filed at 09/04/16 0400  Gross per 24 hour  Intake  0 ml  Output             1150 ml  Net            -1150 ml   Filed Weights   09/03/16 1605 09/03/16 2000  Weight: 109.8 kg (242 lb) 109.1 kg (240 lb 8.4 oz)    Examination:  General exam: Appears calm and comfortable  Respiratory system: Clear to auscultation. Respiratory effort normal. Cardiovascular system: S1 & S2 heard, RRR. No JVD, murmurs, rubs, gallops or clicks. No pedal edema. Gastrointestinal system: Abdomen is nondistended, soft and nontender. No organomegaly or masses felt. Normal bowel sounds heard. Central nervous system: Alert and  oriented. No focal neurological deficits. Extremities: Symmetric 5 x 5 power. Skin: No rashes, lesions or ulcers Psychiatry: Judgement and insight appear normal. Mood & affect appropriate.     Data Reviewed: I have personally reviewed following labs and imaging studies  CBC:  Recent Labs Lab 09/03/16 1630 09/04/16 0304  WBC 13.8* 9.7  HGB 12.9* 11.4*  HCT 40.5 36.3*  MCV 82.8 82.5  PLT 204 [id number]   Basic Metabolic Panel:  Recent Labs Lab 09/03/16 1630 09/04/16 0304  NA 135 136  K 3.9 4.1  CL 98* 102  CO2 28 26  GLUCOSE 51* 222*  BUN 21* 21*  CREATININE 1.48* 1.27*  CALCIUM 8.8* 8.5*   GFR: Estimated Creatinine Clearance: 55.6 mL/min (by C-G formula based on SCr of 1.27 mg/dL (H)). Liver Function Tests: No results for input(s): AST, ALT, ALKPHOS, BILITOT, PROT, ALBUMIN in the last 168 hours. No results for input(s): LIPASE, AMYLASE in the last 168 hours. No results for input(s): AMMONIA in the last 168 hours. Coagulation Profile:  Recent Labs Lab 09/03/16 1817 09/04/16 0304  INR 2.98 2.58   Cardiac Enzymes: No results for input(s): CKTOTAL, CKMB, CKMBINDEX, TROPONINI in the last 168 hours. BNP (last 3 results) No results for input(s): PROBNP in the last 8760 hours. HbA1C: No results for input(s): HGBA1C in the last 72 hours. CBG:  Recent Labs Lab 09/03/16 1855 09/03/16 2118  GLUCAP 147* 185*   Lipid Profile: No results for input(s): CHOL, HDL, LDLCALC, TRIG, CHOLHDL, LDLDIRECT in the last 72 hours. Thyroid Function Tests: No results for input(s): TSH, T4TOTAL, FREET4, T3FREE, THYROIDAB in the last 72 hours. Anemia Panel: No results for input(s): VITAMINB12, FOLATE, FERRITIN, TIBC, IRON, RETICCTPCT in the last 72 hours. Sepsis Labs:  Recent Labs Lab 09/03/16 1652 09/03/16 2004 09/03/16 2352  LATICACIDVEN 1.32 1.2 0.8    Recent Results (from the past 240 hour(s))  MRSA PCR Screening     Status: None   Collection Time: 09/03/16  8:00 PM    Result Value Ref Range Status   MRSA by PCR NEGATIVE NEGATIVE Final    Comment:        The GeneXpert MRSA Assay (FDA approved for NASAL specimens only), is one component of a comprehensive MRSA colonization surveillance program. It is not intended to diagnose MRSA infection nor to guide or monitor treatment for MRSA infections.          Radiology Studies: Dg Chest Portable 1 View  Result Date: 09/03/2016 CLINICAL DATA:  Intermittent chest pain and shortness of breath for 1 week. EXAM: PORTABLE CHEST 1 VIEW COMPARISON:  06/12/2016 FINDINGS: The pacer wires/AICD are stable. Stable cardiac enlargement and tortuous calcified thoracic aorta. Significant chronic lung changes with chronic right pleural thickening and remote right rib trauma. No definite acute overlying pulmonary process. No evidence of CHF. IMPRESSION:  Cardiac enlargement and chronic lung changes. No definite acute pulmonary process. Electronically Signed   By: Marijo Sanes M.D.   On: 09/03/2016 16:22        Scheduled Meds: . amiodarone  200 mg Oral Daily  . aspirin EC  81 mg Oral Daily  . atorvastatin  80 mg Oral q1800  . azithromycin  500 mg Intravenous Q24H  . carvedilol  6.25 mg Oral BID  . cefTRIAXone (ROCEPHIN)  IV  2 g Intravenous Q24H  . ezetimibe  10 mg Oral Daily  . ferrous sulfate  325 mg Oral Q breakfast  . fluticasone  2 spray Each Nare Daily  . furosemide  40 mg Oral BID  . guaiFENesin  600 mg Oral BID  . insulin aspart  0-15 Units Subcutaneous TID WC  . ipratropium-albuterol  3 mL Nebulization Q6H  . levothyroxine  100 mcg Oral QAC breakfast  . mouth rinse  15 mL Mouth Rinse BID  . methylPREDNISolone (SOLU-MEDROL) injection  40 mg Intravenous Q12H  . mometasone-formoterol  2 puff Inhalation BID  . multivitamin with minerals  1 tablet Oral Daily  . pantoprazole  40 mg Oral Daily  . senna  2 tablet Oral QHS  . sodium chloride flush  3 mL Intravenous Q12H  . vitamin B-12  1,000 mcg Oral  Daily  . Warfarin - Pharmacist Dosing Inpatient   Does not apply q1800   Continuous Infusions:   LOS: 1 day    Time spent: 35 minutes.     Elmarie Shiley, MD [facility] Pager [phone]  If 7PM-7AM, please contact night-coverage [email or web address] Password [email or web address] 09/04/2016, 7:18 AM

## 2016-09-05 DIAGNOSIS — J189 Pneumonia, unspecified organism: Principal | ICD-10-CM

## 2016-09-05 LAB — CBC
HCT: 36.2 % — ABNORMAL LOW (ref 39.0–52.0)
Hemoglobin: 11.4 g/dL — ABNORMAL LOW (ref 13.0–17.0)
MCH: 25.8 pg — ABNORMAL LOW (ref 26.0–34.0)
MCHC: 31.5 g/dL (ref 30.0–36.0)
MCV: 81.9 fL (ref 78.0–100.0)
Platelets: 216 10*3/uL (ref 150–400)
RBC: 4.42 MIL/uL (ref 4.22–5.81)
RDW: 25.1 % — ABNORMAL HIGH (ref 11.5–15.5)
WBC: 11.6 10*3/uL — ABNORMAL HIGH (ref 4.0–10.5)

## 2016-09-05 LAB — BASIC METABOLIC PANEL
Anion gap: 8 (ref 5–15)
BUN: 30 mg/dL — ABNORMAL HIGH (ref 6–20)
CO2: 29 mmol/L (ref 22–32)
Calcium: 8.6 mg/dL — ABNORMAL LOW (ref 8.9–10.3)
Chloride: 100 mmol/L — ABNORMAL LOW (ref 101–111)
Creatinine, Ser: 1.33 mg/dL — ABNORMAL HIGH (ref 0.61–1.24)
GFR calc Af Amer: 57 mL/min — ABNORMAL LOW (ref >60)
GFR calc non Af Amer: 49 mL/min — ABNORMAL LOW (ref >60)
Glucose, Bld: 238 mg/dL — ABNORMAL HIGH (ref 65–99)
Potassium: 4.2 mmol/L (ref 3.5–5.1)
Sodium: 137 mmol/L (ref 135–145)

## 2016-09-05 LAB — PROTIME-INR
INR: 3.14
Prothrombin Time: 32.9 seconds — ABNORMAL HIGH (ref 11.4–15.2)

## 2016-09-05 LAB — URINE CULTURE: Culture: NO GROWTH

## 2016-09-05 LAB — GLUCOSE, CAPILLARY
Glucose-Capillary: 232 mg/dL — ABNORMAL HIGH (ref 65–99)
Glucose-Capillary: 211 mg/dL — ABNORMAL HIGH (ref 65–99)
Glucose-Capillary: 268 mg/dL — ABNORMAL HIGH (ref 65–99)
Glucose-Capillary: 279 mg/dL — ABNORMAL HIGH (ref 65–99)

## 2016-09-05 MED ORDER — SPIRONOLACTONE 25 MG PO TABS
12.5000 mg | ORAL_TABLET | Freq: Every day | ORAL | Status: DC
  Administered 2016-09-05 – 2016-09-06 (×2): 12.5 mg via ORAL
  Filled 2016-09-05 (×2): qty 1

## 2016-09-05 MED ORDER — IPRATROPIUM-ALBUTEROL 0.5-2.5 (3) MG/3ML IN SOLN
3.0000 mL | Freq: Four times a day (QID) | RESPIRATORY_TRACT | Status: DC
  Administered 2016-09-05 – 2016-09-06 (×5): 3 mL via RESPIRATORY_TRACT
  Filled 2016-09-05 (×5): qty 3

## 2016-09-05 MED ORDER — ALBUTEROL SULFATE (2.5 MG/3ML) 0.083% IN NEBU: 2.5000 mg | INHALATION_SOLUTION | Freq: Four times a day (QID) | RESPIRATORY_TRACT | Status: DC | PRN

## 2016-09-05 MED ORDER — AZITHROMYCIN 500 MG PO TABS
500.0000 mg | ORAL_TABLET | Freq: Every day | ORAL | Status: DC
  Administered 2016-09-05: 500 mg via ORAL
  Filled 2016-09-05 (×2): qty 1

## 2016-09-05 MED ORDER — LINACLOTIDE 145 MCG PO CAPS
145.0000 ug | ORAL_CAPSULE | Freq: Every day | ORAL | Status: DC
  Administered 2016-09-06: 145 ug via ORAL
  Filled 2016-09-05: qty 1

## 2016-09-05 MED ORDER — BISACODYL 5 MG PO TBEC: 10.0000 mg | DELAYED_RELEASE_TABLET | Freq: Two times a day (BID) | ORAL | Status: DC

## 2016-09-05 MED ORDER — BISACODYL 10 MG RE SUPP
10.0000 mg | Freq: Every day | RECTAL | Status: DC | PRN
  Filled 2016-09-05 (×2): qty 1

## 2016-09-05 MED ORDER — PREDNISONE 20 MG PO TABS
20.0000 mg | ORAL_TABLET | Freq: Every day | ORAL | Status: DC
  Administered 2016-09-05 – 2016-09-06 (×2): 20 mg via ORAL
  Filled 2016-09-05 (×2): qty 1

## 2016-09-05 MED ORDER — INSULIN GLARGINE 100 UNIT/ML ~~LOC~~ SOLN
20.0000 [IU] | Freq: Every day | SUBCUTANEOUS | Status: DC
  Administered 2016-09-05: 20 [IU] via SUBCUTANEOUS
  Filled 2016-09-05 (×2): qty 0.2

## 2016-09-05 MED ORDER — BISACODYL 5 MG PO TBEC
10.0000 mg | DELAYED_RELEASE_TABLET | Freq: Every day | ORAL | Status: DC
  Administered 2016-09-05 – 2016-09-06 (×2): 10 mg via ORAL
  Filled 2016-09-05 (×2): qty 2

## 2016-09-05 NOTE — Progress Notes (Signed)
ANTICOAGULATION CONSULT NOTE - Follow Up Consult  Pharmacy Consult for warfarin Indication: atrial fibrillation  Allergies  Allergen Reactions  . Aldactone [Spironolactone] Other (See Comments)    Hyperkalemia  reported by Dr. [name] - pt is currently taking 12.5 mg daily -November 2017 per medication list by same MD    Patient Measurements: Height: 5\' 8"  (172.7 cm) Weight: 240 lb 8.4 oz (109.1 kg) IBW/kg (Calculated) : 68.4  Vital Signs: Temp: 97.7 F (36.5 C) (11/12 0739) Temp Source: Oral (11/12 0739) BP: 139/74 (11/12 0739) Pulse Rate: 71 (11/12 0739)  Labs:  Recent Labs  09/03/16 1630 09/03/16 1817 09/04/16 0304 09/05/16 0222  HGB 12.9*  --  11.4* 11.4*  HCT 40.5  --  36.3* 36.2*  PLT 204  --  163 216  LABPROT  --  31.6* 28.2* 32.9*  INR  --  2.98 2.58 3.14  CREATININE 1.48*  --  1.27* 1.33*    Estimated Creatinine Clearance: 53.1 mL/min (by C-G formula based on SCr of 1.33 mg/dL (H)).  Assessment:  63 YOM to continue Coumadin from PTA for history of Afib. INR jumped today to 3.14. CBC stable.   PTA dose of warfarin: 5mg  daily except 2.5mg  on Mon  Goal of Therapy:  INR 2-3 Monitor platelets by anticoagulation protocol: Yes   Plan:  HOLD warfarin tonight Daily INR Monitor CBC and for S&S of bleed  Angela Burke, PharmD Pharmacy Resident Pager: [phone] 09/05/2016,9:50 AM

## 2016-09-05 NOTE — Progress Notes (Signed)
PROGRESS NOTE    [name]  [id number] DOB: [birth year] DOA: 09/03/2016 PCP: Irven Shelling, MD    Brief Narrative: [name] is a 82 y.o. male with medical history significant of Ischemic cardiomyopathy EF 35 -40 %, A fib on coumadin, COPD, not on home oxygen who presents with SOB , hypoxemia. He relates that Wednesday night he didn't feel well, experience chills. The following day he had ear pain, fever. The day of admission he started to have SOB, cough, productive. He is still feeling SOB. He reports chest pain, after coughing. He went to see his PCP today, patient was found to be hypoxic, Oxygen Sat 85 RA. Improved to 95 on 2 l.   ED Course: Oxygen in the 80 % improved on 2 L, Cr 1.48, glucose 51, WBC 13, Lactic acid 1.3, chest x ray: Cardiac enlargement and chronic lung changes. No definite acute pulmonary process. Initial BP was low at 85 , but was check on his arm that he has PVD. Repeated BP different arm was normal.  Patient received nebulizer, solumedrol, IV antibiotics in the ED.   Assessment & Plan:   Principal Problem:   Community acquired pneumonia Active Problems:   Essential hypertension   Automatic implantable cardioverter-defibrillator in situ   Long term current use of anticoagulant therapy   Ischemic dilated cardiomyopathy (HCC)   PAF (paroxysmal atrial fibrillation) (HCC)   Acute respiratory failure with hypoxia (HCC)   Acute renal failure superimposed on stage 3 chronic kidney disease (HCC)   Diabetes mellitus with diabetic neuropathy, with long-term current use of insulin (HCC)   Acute respiratory failure (HCC)  1-Acute hypoxic Respiratory Failure:  Multifactorial, clinically he has PNA, with history of cough, fever although chest x ray didn't showed florid PNA. Also in the differential COPD exacerbation, HF exacerbation.  continue with IV ceftriaxone, and Azithromycin, day 3. Continue with Nebulizer, change solumedrol to prednisone.    Blood culture.  Home oral lasix.   2-PNA; presents with cough, history of fever.  Monitor BP, lactic acid, in case patient develops sepsis.  Continue with ceftriaxone and azithromycin.   3-Acute on chronic Systolic HF;  BNP Q000111Q. Negative 1.5 Received one time dose of  IV lasix.  Resume  Home oral lasix Resume spironolactone. Continue with coreg.   4-DM; hypoglycemia;  Hypoglycemia resolved.  SSI.  Will increase lantus.   Delirium; hospital delirium./  Was confused last night. Today he is oriented times 3.  Frequent orientation.   Afib;  continue with coumadin, pharmacy to dose. Continue with  coreg.   CKD stage III; last cr per records 1.6---1.5 Stable.      DVT prophylaxis: On coumadin  Code Status: Full code.  Family Communication: care discussed with patient  Disposition Plan: remain inpatient.  Consultants:   none  Procedures:  None  Antimicrobials:  Ceftriaxone 11-10 Azithromycin 11-10  Subjective: He desat last night, mouth breather.  He report been confused last night.  He is feeling better, denies dyspnea.   Objective: Vitals:   09/05/16 0000 09/05/16 0317 09/05/16 0400 09/05/16 0739  BP: 115/70 132/62 (!) 143/78 139/74  Pulse: 69 73 71 71  Resp: 18 20 (!) 21 19  Temp:  98.2 F (36.8 C)  97.7 F (36.5 C)  TempSrc:  Oral  Oral  SpO2: 91% 97% 97% 98%  Weight:      Height:        Intake/Output Summary (Last 24 hours) at 09/05/16 0758 Last data filed at 09/04/16  2000  Gross per 24 hour  Intake             1340 ml  Output              325 ml  Net             1015 ml   Filed Weights   09/03/16 1605 09/03/16 2000  Weight: 109.8 kg (242 lb) 109.1 kg (240 lb 8.4 oz)    Examination:  General exam: Appears calm and comfortable  Respiratory system: Clear to auscultation. Respiratory effort normal. Cardiovascular system: S1 & S2 heard, RRR. No JVD, murmurs, rubs, gallops or clicks. No pedal edema. Gastrointestinal system: Abdomen is  nondistended, soft and nontender. No organomegaly or masses felt. Normal bowel sounds heard. Central nervous system: Alert and oriented. No focal neurological deficits. Extremities: Symmetric 5 x 5 power. Skin: No rashes, lesions or ulcers Psychiatry: Judgement and insight appear normal. Mood & affect appropriate.     Data Reviewed: I have personally reviewed following labs and imaging studies  CBC:  Recent Labs Lab 09/03/16 1630 09/04/16 0304 09/05/16 0222  WBC 13.8* 9.7 11.6*  HGB 12.9* 11.4* 11.4*  HCT 40.5 36.3* 36.2*  MCV 82.8 82.5 81.9  PLT 204 163 [id number]   Basic Metabolic Panel:  Recent Labs Lab 09/03/16 1630 09/04/16 0304 09/05/16 0222  NA 135 136 137  K 3.9 4.1 4.2  CL 98* 102 100*  CO2 28 26 29   GLUCOSE 51* 222* 238*  BUN 21* 21* 30*  CREATININE 1.48* 1.27* 1.33*  CALCIUM 8.8* 8.5* 8.6*   GFR: Estimated Creatinine Clearance: 53.1 mL/min (by C-G formula based on SCr of 1.33 mg/dL (H)). Liver Function Tests: No results for input(s): AST, ALT, ALKPHOS, BILITOT, PROT, ALBUMIN in the last 168 hours. No results for input(s): LIPASE, AMYLASE in the last 168 hours. No results for input(s): AMMONIA in the last 168 hours. Coagulation Profile:  Recent Labs Lab 09/03/16 1817 09/04/16 0304 09/05/16 0222  INR 2.98 2.58 3.14   Cardiac Enzymes: No results for input(s): CKTOTAL, CKMB, CKMBINDEX, TROPONINI in the last 168 hours. BNP (last 3 results) No results for input(s): PROBNP in the last 8760 hours. HbA1C: No results for input(s): HGBA1C in the last 72 hours. CBG:  Recent Labs Lab 09/04/16 0725 09/04/16 1150 09/04/16 1638 09/04/16 2106 09/05/16 0741  GLUCAP 223* 261* 277* 318* 232*   Lipid Profile: No results for input(s): CHOL, HDL, LDLCALC, TRIG, CHOLHDL, LDLDIRECT in the last 72 hours. Thyroid Function Tests: No results for input(s): TSH, T4TOTAL, FREET4, T3FREE, THYROIDAB in the last 72 hours. Anemia Panel: No results for input(s): VITAMINB12,  FOLATE, FERRITIN, TIBC, IRON, RETICCTPCT in the last 72 hours. Sepsis Labs:  Recent Labs Lab 09/03/16 1652 09/03/16 2004 09/03/16 2352  LATICACIDVEN 1.32 1.2 0.8    Recent Results (from the past 240 hour(s))  Blood Culture (routine x 2)     Status: None (Preliminary result)   Collection Time: 09/03/16  4:30 PM  Result Value Ref Range Status   Specimen Description BLOOD LEFT ANTECUBITAL  Final   Special Requests BOTTLES DRAWN AEROBIC AND ANAEROBIC 5CC  Final   Culture NO GROWTH < 24 HOURS  Final   Report Status PENDING  Incomplete  Blood Culture (routine x 2)     Status: None (Preliminary result)   Collection Time: 09/03/16  4:30 PM  Result Value Ref Range Status   Specimen Description BLOOD RIGHT ANTECUBITAL  Final   Special Requests BOTTLES DRAWN AEROBIC  AND ANAEROBIC 5ML  Final   Culture NO GROWTH < 24 HOURS  Final   Report Status PENDING  Incomplete  MRSA PCR Screening     Status: None   Collection Time: 09/03/16  8:00 PM  Result Value Ref Range Status   MRSA by PCR NEGATIVE NEGATIVE Final    Comment:        The GeneXpert MRSA Assay (FDA approved for NASAL specimens only), is one component of a comprehensive MRSA colonization surveillance program. It is not intended to diagnose MRSA infection nor to guide or monitor treatment for MRSA infections.          Radiology Studies: Dg Chest Portable 1 View  Result Date: 09/03/2016 CLINICAL DATA:  Intermittent chest pain and shortness of breath for 1 week. EXAM: PORTABLE CHEST 1 VIEW COMPARISON:  06/12/2016 FINDINGS: The pacer wires/AICD are stable. Stable cardiac enlargement and tortuous calcified thoracic aorta. Significant chronic lung changes with chronic right pleural thickening and remote right rib trauma. No definite acute overlying pulmonary process. No evidence of CHF. IMPRESSION: Cardiac enlargement and chronic lung changes. No definite acute pulmonary process. Electronically Signed   By: Marijo Sanes M.D.    On: 09/03/2016 16:22        Scheduled Meds: . amiodarone  200 mg Oral Daily  . aspirin EC  81 mg Oral Daily  . atorvastatin  80 mg Oral q1800  . azithromycin  500 mg Intravenous Q24H  . carvedilol  6.25 mg Oral BID  . cefTRIAXone (ROCEPHIN)  IV  2 g Intravenous Q24H  . ezetimibe  10 mg Oral Daily  . ferrous sulfate  325 mg Oral Q breakfast  . fluticasone  2 spray Each Nare Daily  . furosemide  40 mg Oral BID  . guaiFENesin  600 mg Oral BID  . insulin aspart  0-15 Units Subcutaneous TID WC  . insulin aspart  0-5 Units Subcutaneous QHS  . insulin glargine  15 Units Subcutaneous QHS  . ipratropium-albuterol  3 mL Nebulization QID  . levothyroxine  100 mcg Oral QAC breakfast  . mouth rinse  15 mL Mouth Rinse BID  . methylPREDNISolone (SOLU-MEDROL) injection  40 mg Intravenous Q24H  . mometasone-formoterol  2 puff Inhalation BID  . multivitamin with minerals  1 tablet Oral Daily  . pantoprazole  40 mg Oral Daily  . senna  2 tablet Oral QHS  . sodium chloride flush  3 mL Intravenous Q12H  . vitamin B-12  1,000 mcg Oral Daily  . Warfarin - Pharmacist Dosing Inpatient   Does not apply q1800   Continuous Infusions:   LOS: 2 days    Time spent: 35 minutes.     Elmarie Shiley, MD [facility] Pager [phone]  If 7PM-7AM, please contact night-coverage [email or web address] Password [facility] 09/05/2016, 7:58 AM

## 2016-09-05 NOTE — Evaluation (Signed)
Physical Therapy Evaluation Patient Details Name: [name] MRN: [id number] DOB: [birth year] Today's Date: 09/05/2016   History of Present Illness  pt presents with CAP.  pt with hx of A-fib, COPD, HTN, AICD, DM, HF, CKD, CAD, MI, PAD, CVA, and CABG.    Clinical Impression  Pt eager for ambulation.  Pt trialed mobility on RA with O2 sats remaining 90-92% throughout ambulation, RN made aware.  Pt indicates having all needed DME at home and encouraged pt to continue ambulating with nsg staff while on acute and to continue this while at home.  Will continue to follow.      Follow Up Recommendations No PT follow up;Supervision - Intermittent    Equipment Recommendations  None recommended by PT    Recommendations for Other Services       Precautions / Restrictions Precautions Precautions: Fall Restrictions Weight Bearing Restrictions: No      Mobility  Bed Mobility               General bed mobility comments: pt sitting in recliner.  Transfers Overall transfer level: Needs assistance Equipment used: Rolling walker (2 wheeled) Transfers: Sit to/from Stand Sit to Stand: Min assist         General transfer comment: pt uses momentum to come to standing from recliner.  pt needed MinA for steadying.    Ambulation/Gait Ambulation/Gait assistance: Min guard Ambulation Distance (Feet): 200 Feet Assistive device: Rolling walker (2 wheeled) Gait Pattern/deviations: Step-through pattern;Decreased stride length;Trunk flexed     General Gait Details: pt moves slowly and stays back far from his RW, however pt is used to a rollator at home.  pt on RA throughout ambulation with sats remaining 90 - 92%.    Stairs            Wheelchair Mobility    Modified Rankin (Stroke Patients Only)       Balance Overall balance assessment: Needs assistance Sitting-balance support: No upper extremity supported;Feet supported Sitting balance-Leahy Scale: Good      Standing balance support: Single extremity supported;Bilateral upper extremity supported;No upper extremity supported;During functional activity Standing balance-Leahy Scale: Fair Standing balance comment: pt able to use urinal in standing, but does frequently place single UE on RW for support.                               Pertinent Vitals/Pain Pain Assessment: No/denies pain    Home Living Family/patient expects to be discharged to:: Private residence Living Arrangements: Spouse/significant other Available Help at Discharge: Family;Available 24 hours/day Type of Home: House Home Access: Stairs to enter Entrance Stairs-Rails: Right Entrance Stairs-Number of Steps: 3 Home Layout: One level Home Equipment: Walker - 4 wheels      Prior Function Level of Independence: Needs assistance   Gait / Transfers Assistance Needed: Uses rollator for ambulation.  ADL's / Homemaking Assistance Needed: A with reaching feet for socks/shoes/washing and washing back.          Hand Dominance        Extremity/Trunk Assessment   Upper Extremity Assessment: Overall WFL for tasks assessed           Lower Extremity Assessment: Generalized weakness      Cervical / Trunk Assessment: Kyphotic  Communication   Communication: No difficulties  Cognition Arousal/Alertness: Awake/alert Behavior During Therapy: WFL for tasks assessed/performed Overall Cognitive Status: Within Functional Limits for tasks assessed  General Comments      Exercises     Assessment/Plan    PT Assessment Patient needs continued PT services  PT Problem List Decreased strength;Decreased activity tolerance;Decreased balance;Decreased mobility;Decreased knowledge of use of DME;Cardiopulmonary status limiting activity;Obesity          PT Treatment Interventions DME instruction;Gait training;Stair training;Functional mobility training;Therapeutic activities;Therapeutic  exercise;Balance training;Patient/family education    PT Goals (Current goals can be found in the Care Plan section)  Acute Rehab PT Goals Patient Stated Goal: Go home PT Goal Formulation: With patient Time For Goal Achievement: 09/12/16 Potential to Achieve Goals: Good    Frequency Min 3X/week   Barriers to discharge        Co-evaluation               End of Session Equipment Utilized During Treatment: Gait belt Activity Tolerance: Patient tolerated treatment well Patient left: in chair;with call bell/phone within reach;with family/visitor present Nurse Communication: Mobility status (O2 sats)         Time: [id number] PT Time Calculation (min) (ACUTE ONLY): 26 min   Charges:   PT Evaluation $PT Eval Moderate Complexity: 1 Procedure PT Treatments $Gait Training: 8-22 mins   PT G CodesCatarina Hartshorn, [place] 09/05/2016, 1:09 PM

## 2016-09-05 NOTE — Plan of Care (Signed)
Problem: Activity: Goal: Ability to tolerate increased activity will improve Outcome: Progressing Up with PT today.  Problem: Respiratory: Goal: Ability to maintain a clear airway will improve Outcome: Progressing Encouraged coughing and deep breathing, pt compliant.

## 2016-09-06 ENCOUNTER — Encounter: Payer: Medicare Other | Admitting: Internal Medicine

## 2016-09-06 LAB — BASIC METABOLIC PANEL
Anion gap: 9 (ref 5–15)
BUN: 37 mg/dL — ABNORMAL HIGH (ref 6–20)
CO2: 29 mmol/L (ref 22–32)
Calcium: 8.6 mg/dL — ABNORMAL LOW (ref 8.9–10.3)
Chloride: 98 mmol/L — ABNORMAL LOW (ref 101–111)
Creatinine, Ser: 1.46 mg/dL — ABNORMAL HIGH (ref 0.61–1.24)
GFR calc Af Amer: 51 mL/min — ABNORMAL LOW (ref >60)
GFR calc non Af Amer: 44 mL/min — ABNORMAL LOW (ref >60)
Glucose, Bld: 293 mg/dL — ABNORMAL HIGH (ref 65–99)
Potassium: 4.2 mmol/L (ref 3.5–5.1)
Sodium: 136 mmol/L (ref 135–145)

## 2016-09-06 LAB — PROTIME-INR
INR: 2.93
Prothrombin Time: 31.2 seconds — ABNORMAL HIGH (ref 11.4–15.2)

## 2016-09-06 LAB — GLUCOSE, CAPILLARY
Glucose-Capillary: 231 mg/dL — ABNORMAL HIGH (ref 65–99)
Glucose-Capillary: 246 mg/dL — ABNORMAL HIGH (ref 65–99)

## 2016-09-06 MED ORDER — WARFARIN SODIUM 2.5 MG PO TABS: 2.5000 mg | ORAL_TABLET | Freq: Once | ORAL | Status: DC

## 2016-09-06 MED ORDER — FLEET ENEMA 7-19 GM/118ML RE ENEM
1.0000 | ENEMA | Freq: Once | RECTAL | Status: DC
  Filled 2016-09-06: qty 1

## 2016-09-06 MED ORDER — AZITHROMYCIN 500 MG PO TABS: 500.0000 mg | ORAL_TABLET | Freq: Every day | ORAL | 0 refills | Status: DC

## 2016-09-06 MED ORDER — TRAMADOL HCL 50 MG PO TABS: 50.0000 mg | ORAL_TABLET | Freq: Four times a day (QID) | ORAL | 0 refills | Status: DC | PRN

## 2016-09-06 MED ORDER — BISACODYL 5 MG PO TBEC: 10.0000 mg | DELAYED_RELEASE_TABLET | Freq: Every day | ORAL | 0 refills | Status: DC

## 2016-09-06 MED ORDER — GUAIFENESIN ER 600 MG PO TB12: 600.0000 mg | ORAL_TABLET | Freq: Two times a day (BID) | ORAL | 0 refills | Status: DC

## 2016-09-06 MED ORDER — PREDNISONE 20 MG PO TABS: 20.0000 mg | ORAL_TABLET | Freq: Every day | ORAL | 0 refills | Status: DC

## 2016-09-06 MED ORDER — INSULIN DEGLUDEC 100 UNIT/ML ~~LOC~~ SOPN: 30.0000 [IU] | PEN_INJECTOR | Freq: Every day | SUBCUTANEOUS | 0 refills | Status: DC

## 2016-09-06 MED ORDER — CEPHALEXIN 500 MG PO CAPS: 500.0000 mg | ORAL_CAPSULE | Freq: Two times a day (BID) | ORAL | 0 refills | Status: DC

## 2016-09-06 NOTE — Evaluation (Signed)
Clinical/Bedside Swallow Evaluation Patient Details  Name: [name] MRN: [id number] Date of Birth: 02/02/1934  Today's Date: 09/06/2016 Time: SLP Start Time (ACUTE ONLY): [id number] SLP Stop Time (ACUTE ONLY): 0936 SLP Time Calculation (min) (ACUTE ONLY): 7 min  Past Medical History:  Past Medical History:  Diagnosis Date  . Atrial fibrillation ([place])    persistent, previously seen at [facility] and placed on amiodarone  . Benign prostatic hypertrophy   . CAD (coronary artery disease)    multivessel s/p inferolateral wall MI with subsequent CABG 11/1998.  Cath 2009 with Patent grafts  . Cleft palate   . COPD with emphysema ([place]) 04/01/2010  . DM (diabetes mellitus), type 2 ([place])   . Dyspnea   . GERD (gastroesophageal reflux disease)   . HTN (hypertension)   . Hyperlipidemia   . Hypothyroidism   . Iron deficiency anemia   . Ischemic dilated cardiomyopathy ([place])    EF 35-40% by MUGA 6/11  . Myocardial infarction   . Nasal septal deviation   . Nephrolithiasis   . OSA (obstructive sleep apnea)   . PAF (paroxysmal atrial fibrillation) ([place])   . Peripheral arterial disease (HCC)    left subclavian artery stenosis  . PNA (pneumonia)   . Psoriasis   . Seborrheic keratosis   . Stroke ([place])   . Systolic congestive heart failure ([place]) 2009   s/p BiV ICD implantation by Dr Leonia Reeves (MDT)   Past Surgical History:  Past Surgical History:  Procedure Laterality Date  . BI-VENTRICULAR IMPLANTABLE CARDIOVERTER DEFIBRILLATOR  (CRT-D)  10-08-08; 11-06-2013   Dr Leonia Reeves (MDT) implant for primary prevention; gen change to MDT VivaXT CRTD by Dr Rayann Heman  . BIV ICD GENERTAOR CHANGE OUT N/A 11/06/2013   Procedure: BIV ICD GENERTAOR CHANGE OUT;  Surgeon: Coralyn Mark, MD;  Location: [facility] CATH LAB;  Service: Cardiovascular;  Laterality: N/A;  . c-spine surgery    . CARDIOVERSION N/A 04/29/2016   Procedure: CARDIOVERSION;  Surgeon: Pixie Casino, MD;  Location: [facility] ENDOSCOPY;  Service: Cardiovascular;   Laterality: N/A;  . CARPAL TUNNEL RELEASE    . CATARACT EXTRACTION    . CORONARY ARTERY BYPASS GRAFT     LIMA to LAD, SVG to OM, SVG to diagonal  . ELECTROPHYSIOLOGIC STUDY N/A 06/11/2016   Procedure: Atrial Fibrillation Ablation;  Surgeon: Thompson Grayer, MD;  Location: [place] CV LAB;  Service: Cardiovascular;  Laterality: N/A;  . left cleft palate and left cleft lip repair     HPI:  [name] a 82 y.o.malewith medical history significant of Ischemic cardiomyopathy EF 35 -40 %, A fib on coumadin, COPD, not on home oxygen who presents with SOB , hypoxemia. Dx wtih CAP (clinically with cough, fever, although CXR did not show PNA).    Assessment / Plan / Recommendation Clinical Impression  Patient presents with a normal appearing oropharyngeal swallow. COPD does increase risk of silent aspiration however at this time patient appears to be protecting airway. Educated patient on general safe swallowing precautions related to COPD including limiting use of straw. Verbalized understanding. No further needs indicated.     Aspiration Risk  Mild aspiration risk    Diet Recommendation Regular;Thin liquid   Liquid Administration via: Straw;Cup (limit straw) Medication Administration: Whole meds with liquid Supervision: Patient able to self feed Compensations: Slow rate;Small sips/bites Postural Changes: Seated upright at 90 degrees    Other  Recommendations Oral Care Recommendations: Oral care BID   Follow up Recommendations None  Swallow Study   General HPI: [name] a 82 y.o.malewith medical history significant of Ischemic cardiomyopathy EF 35 -40 %, A fib on coumadin, COPD, not on home oxygen who presents with SOB , hypoxemia. Dx wtih CAP (clinically with cough, fever, although CXR did not show PNA).  Type of Study: Bedside Swallow Evaluation Previous Swallow Assessment: none Diet Prior to this Study: Regular;Thin liquids Temperature Spikes Noted:  Yes Respiratory Status: Room air History of Recent Intubation: No Behavior/Cognition: Alert;Cooperative;Pleasant mood Oral Cavity Assessment: Within Functional Limits Oral Care Completed by SLP: No Oral Cavity - Dentition: Adequate natural dentition Vision: Functional for self-feeding Self-Feeding Abilities: Able to feed self Patient Positioning: Upright in chair Baseline Vocal Quality: Normal Volitional Cough: Strong Volitional Swallow: Able to elicit    Oral/Motor/Sensory Function Overall Oral Motor/Sensory Function: Within functional limits   Ice Chips Ice chips: Not tested   Thin Liquid Thin Liquid: Within functional limits Presentation: Cup;Self Fed;Straw    Nectar Thick Nectar Thick Liquid: Not tested   Honey Thick Honey Thick Liquid: Not tested   Puree Puree: Not tested   Solid   GO  [name] MA, CCC-SLP [phone]  Solid: Within functional limits Presentation: Self Fed        [name] 09/06/2016,11:35 AM

## 2016-09-06 NOTE — Progress Notes (Signed)
Inpatient Diabetes Program Recommendations  AACE/ADA: New Consensus Statement on Inpatient Glycemic Control (2015)  Target Ranges:  Prepandial:   less than 140 mg/dL      Peak postprandial:   less than 180 mg/dL (1-2 hours)      Critically ill patients:  140 - 180 mg/dL   Lab Results  Component Value Date   GLUCAP 231 (H) 09/06/2016   HGBA1C 7.7 (H) 01/15/2016    Review of Glycemic Control Results for [name], [name] (MRN [id number]) as of 09/06/2016 09:57  Ref. Range 09/05/2016 07:41 09/05/2016 11:16 09/05/2016 16:28 09/05/2016 21:30 09/06/2016 07:55  Glucose-Capillary Latest Ref Range: 65 - 99 mg/dL 232 (H) 211 (H) 268 (H) 279 (H) 231 (H)   Diabetes history: DM2 Outpatient Diabetes medications: Tresiba 50 qd+Novolog 6 units tid meal coverage Current orders for Inpatient glycemic control: Lantus 20 units +Novolog correction 0-15 units tid+0-5 units hs  Inpatient Diabetes Program Recommendations:  Please consider -A1c to determine prehospital glycemic control. Noted hypoglycemic on adm. -Add Novolog 3 units tid meal coverage if eats 50% -Increase Lantus to 30 units daily  Thank you, Bethena Roys E. Dorinne Graeff, RN, MSN, CDE Inpatient Glycemic Control Team Team Pager [phone] (8am-5pm) 09/06/2016 10:01 AM

## 2016-09-06 NOTE — Progress Notes (Signed)
ANTICOAGULATION CONSULT NOTE - Follow Up Consult  Pharmacy Consult for warfarin Indication: atrial fibrillation  Allergies  Allergen Reactions  . Aldactone [Spironolactone] Other (See Comments)    Hyperkalemia  reported by Dr. [name] - pt is currently taking 12.5 mg daily -November 2017 per medication list by same MD    Patient Measurements: Height: 5\' 8"  (172.7 cm) Weight: 240 lb 8.4 oz (109.1 kg) IBW/kg (Calculated) : 68.4  Vital Signs: Temp: 97.5 F (36.4 C) (11/13 0757) Temp Source: Oral (11/13 0757) BP: 125/74 (11/13 0757) Pulse Rate: 87 (11/13 0400)  Labs:  Recent Labs  09/03/16 1630  09/04/16 0304 09/05/16 0222 09/06/16 0226  HGB 12.9*  --  11.4* 11.4*  --   HCT 40.5  --  36.3* 36.2*  --   PLT 204  --  163 216  --   LABPROT  --   < > 28.2* 32.9* 31.2*  INR  --   < > 2.58 3.14 2.93  CREATININE 1.48*  --  1.27* 1.33* 1.46*  < > = values in this interval not displayed.  Estimated Creatinine Clearance: 48.3 mL/min (by C-G formula based on SCr of 1.46 mg/dL (H)).  Assessment:  72 YOM to continue Coumadin from PTA for history of Afib. INR 2.93 and it goal (down from 3.14). Coumadin sensitivity may be up due to antibiotics.   PTA dose of warfarin: 5mg  daily except 2.5mg  on Mon  Goal of Therapy:  INR 2-3 Monitor platelets by anticoagulation protocol: Yes   Plan:  Coumadin 2.5mg  po today Daily INR Monitor CBC and for S&S of bleed  Hildred Laser, Pharm D 09/06/2016 9:09 AM

## 2016-09-06 NOTE — Progress Notes (Signed)
Physical Therapy Treatment Patient Details Name: [name] MRN: [id number] DOB: [birth year] Today's Date: Sep 25, 2016    History of Present Illness pt presents with CAP.  pt with hx of A-fib, COPD, HTN, AICD, DM, HF, CKD, CAD, MI, PAD, CVA, and CABG.      PT Comments    Pt making good progress with mobility. Pt able to return home with wife.  Follow Up Recommendations  No PT follow up;Supervision - Intermittent     Equipment Recommendations  None recommended by PT    Recommendations for Other Services       Precautions / Restrictions Precautions Precautions: Fall Restrictions Weight Bearing Restrictions: No    Mobility  Bed Mobility               General bed mobility comments: pt sitting in recliner.  Transfers Overall transfer level: Needs assistance Equipment used: Rolling walker (2 wheeled) Transfers: Sit to/from Stand Sit to Stand: Supervision         General transfer comment: Pt able to rise from recliner and commode  Ambulation/Gait Ambulation/Gait assistance: Supervision Ambulation Distance (Feet): 250 Feet Assistive device: 4-wheeled walker Gait Pattern/deviations: Step-through pattern;Decreased stride length;Trunk flexed     General Gait Details: Verbal cues to stand more erect and stay closer to rollator   Stairs            Wheelchair Mobility    Modified Rankin (Stroke Patients Only)       Balance Overall balance assessment: Needs assistance Sitting-balance support: No upper extremity supported Sitting balance-Leahy Scale: Good     Standing balance support: Single extremity supported;No upper extremity supported Standing balance-Leahy Scale: Fair                      Cognition Arousal/Alertness: Awake/alert Behavior During Therapy: WFL for tasks assessed/performed Overall Cognitive Status: Within Functional Limits for tasks assessed                      Exercises      General Comments         Pertinent Vitals/Pain Pain Assessment: No/denies pain    Home Living                      Prior Function            PT Goals (current goals can now be found in the care plan section) Acute Rehab PT Goals Patient Stated Goal: Go home Progress towards PT goals: Progressing toward goals    Frequency    Min 3X/week      PT Plan Current plan remains appropriate    Co-evaluation             End of Session   Activity Tolerance: Patient tolerated treatment well Patient left: in chair;with call bell/phone within reach;with family/visitor present     Time: 1125-1145 PT Time Calculation (min) (ACUTE ONLY): 20 min  Charges:  $Gait Training: 8-22 mins                    G Codes:      Nona Gracey 09/25/16, 12:58 PM [employer] PT [phone]

## 2016-09-06 NOTE — Discharge Summary (Signed)
Physician Discharge Summary  [name] [id number] DOB: 1934-07-16 DOA: 09/03/2016  PCP: Irven Shelling, MD  Admit date: 09/03/2016 Discharge date: 09/06/2016  Admitted From:  Home  Disposition: home   Recommendations for Outpatient Follow-up:  1. Follow up with PCP in 1-2 weeks 2. Please obtain BMP/CBC in one week   Home Health:  Discharge Condition: stable.  CODE STATUS: full code.  Diet recommendation: Heart Healthy   Brief/Interim Summary: [name] Warwick Cauthrenis a 82 y.o.malewith medical history significant of Ischemic cardiomyopathy EF 35 -40 %, A fib on coumadin, COPD, not on home oxygen who presents with SOB , hypoxemia. He relates that Wednesday night he didn't feel well, experience chills. The following day he had ear pain, fever. The day of admission he started to have SOB, cough, productive. He is still feeling SOB. He reports chest pain, after coughing. He went to see his PCP today, patient was found to be hypoxic, Oxygen Sat 85 RA. Improved to 95 on 2 l.   ED Course:Oxygen in the 80 % improved on 2 L, Cr 1.48, glucose 51, WBC 13, Lactic acid 1.3, chest x ray: Cardiac enlargement and chronic lung changes. No definite acute pulmonary process. Initial BP was low at 85 , but was check on his arm that he has PVD. Repeated BP different arm was normal.  Patient received nebulizer, solumedrol, IV antibiotics in the ED.   Assessment & Plan: 1-Acute hypoxic Respiratory Failure:  Multifactorial, clinically he has PNA, with history of cough, fever although chest x ray didn't showed florid PNA. Also in the differential COPD exacerbation, HF exacerbation.  continue with IV ceftriaxone, and Azithromycin, day 3.discharge on keflex and azithro  Continue with Nebulizer, change solumedrol to prednisone.  Blood culture.  Home oral lasix.   2-PNA; presents with cough, history of fever.  Monitor BP, lactic acid, in case patient develops sepsis.  Continue with  ceftriaxone and azithromycin.   3-Acute on chronic Systolic HF;  BNP [id number]. Negative 1.5 Received one time dose of  IV lasix.  Resume  Home oral lasix Resume spironolactone. Continue with coreg.   4-DM; hypoglycemia;  Hypoglycemia resolved.  SSI.  Resume lower dose of long acting insulin   Delirium; hospital delirium./  Was confused last night. Today he is oriented times 3.  Frequent orientation.   Afib;  continue with coumadin, pharmacy to dose. Continue with  coreg.   CKD stage III; last cr per records 1.6---1.5 Stable.    Discharge Diagnoses:  Principal Problem:   Community acquired pneumonia Active Problems:   Essential hypertension   Automatic implantable cardioverter-defibrillator in situ   Long term current use of anticoagulant therapy   Ischemic dilated cardiomyopathy (HCC)   PAF (paroxysmal atrial fibrillation) (HCC)   Acute respiratory failure with hypoxia (HCC)   Acute renal failure superimposed on stage 3 chronic kidney disease (HCC)   Diabetes mellitus with diabetic neuropathy, with long-term current use of insulin ([place])   Acute respiratory failure ([place])    Discharge Instructions  Discharge Instructions    Diet - low sodium heart healthy    Complete by:  As directed    Increase activity slowly    Complete by:  As directed        Medication List    STOP taking these medications   DULERA 100-5 MCG/ACT Aero Generic drug:  mometasone-formoterol     TAKE these medications   acetaminophen 500 MG tablet Commonly known as:  TYLENOL Take 1,000 mg by mouth See admin  instructions. Take 2 tablets (1000 mg) by mouth daily at bedtime, may also take 2 tablets every 6 hours as needed for pain/fever   albuterol 108 (90 Base) MCG/ACT inhaler Commonly known as:  PROVENTIL HFA;VENTOLIN HFA Inhale 2 puffs into the lungs every 6 (six) hours as needed for wheezing or shortness of breath. Must keep June 2016 appt. What changed:  when to take  this  additional instructions   albuterol (2.5 MG/3ML) 0.083% nebulizer solution Commonly known as:  PROVENTIL USE 1 VIAL VIA NEBULIZER 4 TIMES A DAY AS DIRECTED What changed:  Another medication with the same name was changed. Make sure you understand how and when to take each.   amiodarone 200 MG tablet Commonly known as:  PACERONE Take 200 mg by mouth daily.   aspirin EC 81 MG tablet Take 81 mg by mouth daily.   atorvastatin 80 MG tablet Commonly known as:  LIPITOR TAKE 1 TABLET (80 MG TOTAL) BY MOUTH DAILY.   azithromycin 500 MG tablet Commonly known as:  ZITHROMAX Take 1 tablet (500 mg total) by mouth daily.   bisacodyl 5 MG EC tablet Commonly known as:  DULCOLAX Take 2 tablets (10 mg total) by mouth daily at 12 noon.   carvedilol 6.25 MG tablet Commonly known as:  COREG TAKE 1 TABLET BY MOUTH TWICE A DAY   cephALEXin 500 MG capsule Commonly known as:  KEFLEX Take 1 capsule (500 mg total) by mouth 2 (two) times daily.   ferrous sulfate 325 (65 FE) MG tablet Take 325 mg by mouth daily with breakfast.   fluticasone 50 MCG/ACT nasal spray Commonly known as:  FLONASE PLACE 2 SPRAYS INTO BOTH NOSTRILS DAILY.   furosemide 40 MG tablet Commonly known as:  LASIX Take 40 mg by mouth 2 (two) times daily.   guaiFENesin 600 MG 12 hr tablet Commonly known as:  MUCINEX Take 1 tablet (600 mg total) by mouth 2 (two) times daily.   insulin degludec 100 UNIT/ML Sopn FlexTouch Pen Commonly known as:  TRESIBA FLEXTOUCH Inject 0.3 mLs (30 Units total) into the skin daily before lunch. What changed:  how much to take   levothyroxine 100 MCG tablet Commonly known as:  SYNTHROID, LEVOTHROID Take 100 mcg by mouth daily before breakfast.   multivitamin with minerals Tabs tablet Take 1 tablet by mouth daily.   NOVOLOG FLEXPEN 100 UNIT/ML FlexPen Generic drug:  insulin aspart Inject 6 Units into the skin 3 (three) times daily before meals.   omeprazole 20 MG  capsule Commonly known as:  PRILOSEC Take 20 mg by mouth daily.   predniSONE 20 MG tablet Commonly known as:  DELTASONE Take 1 tablet (20 mg total) by mouth daily with breakfast.   PRESCRIPTION MEDICATION Inhale into the lungs at bedtime. CPAP   SENOKOT 8.6 MG tablet Generic drug:  senna Take 2 tablets by mouth at bedtime.   spironolactone 25 MG tablet Commonly known as:  ALDACTONE TAKE 1/2 TABLET (12.5 MG TOTAL) BY MOUTH DAILY.   Tiotropium Bromide Monohydrate 2.5 MCG/ACT Aers Commonly known as:  SPIRIVA RESPIMAT Inhale 2 puffs into the lungs daily.   traMADol 50 MG tablet Commonly known as:  ULTRAM Take 1 tablet (50 mg total) by mouth every 6 (six) hours as needed for moderate pain.   vitamin B-12 1000 MCG tablet Commonly known as:  CYANOCOBALAMIN Take 1,000 mcg by mouth daily.   warfarin 5 MG tablet Commonly known as:  COUMADIN TAKE AS DIRECTED BY COUMADIN CLINIC What changed:  See the new instructions.   ZETIA 10 MG tablet Generic drug:  ezetimibe Take 10 mg by mouth daily.       Allergies  Allergen Reactions  . Aldactone [Spironolactone] Other (See Comments)    Hyperkalemia  reported by Dr. [name] - pt is currently taking 12.5 mg daily -November 2017 per medication list by same MD    Consultations:  none   Procedures/Studies: Dg Chest Portable 1 View  Result Date: 09/03/2016 CLINICAL DATA:  Intermittent chest pain and shortness of breath for 1 week. EXAM: PORTABLE CHEST 1 VIEW COMPARISON:  06/12/2016 FINDINGS: The pacer wires/AICD are stable. Stable cardiac enlargement and tortuous calcified thoracic aorta. Significant chronic lung changes with chronic right pleural thickening and remote right rib trauma. No definite acute overlying pulmonary process. No evidence of CHF. IMPRESSION: Cardiac enlargement and chronic lung changes. No definite acute pulmonary process. Electronically Signed   By: Marijo Sanes M.D.   On: 09/03/2016 16:22         Subjective: Denies dyspnea.  No BM, will try enema.   Discharge Exam: Vitals:   09/06/16 0757 09/06/16 1146  BP: 125/74 94/64  Pulse:    Resp: 19   Temp: 97.5 F (36.4 C) 97.4 F (36.3 C)   Vitals:   09/06/16 0400 09/06/16 0757 09/06/16 1146 09/06/16 1149  BP: 140/72 125/74 94/64   Pulse: 87     Resp: (!) 30 19    Temp: 98.3 F (36.8 C) 97.5 F (36.4 C) 97.4 F (36.3 C)   TempSrc: Oral Oral Oral   SpO2: 97% 98%  91%  Weight:      Height:        General: Pt is alert, awake, not in acute distress Cardiovascular: RRR, S1/S2 +, no rubs, no gallops Respiratory: CTA bilaterally, no wheezing, no rhonchi Abdominal: Soft, NT, ND, bowel sounds + Extremities: no edema, no cyanosis    The results of significant diagnostics from this hospitalization (including imaging, microbiology, ancillary and laboratory) are listed below for reference.     Microbiology: Recent Results (from the past 240 hour(s))  Blood Culture (routine x 2)     Status: None (Preliminary result)   Collection Time: 09/03/16  4:30 PM  Result Value Ref Range Status   Specimen Description BLOOD LEFT ANTECUBITAL  Final   Special Requests BOTTLES DRAWN AEROBIC AND ANAEROBIC 5CC  Final   Culture NO GROWTH 2 DAYS  Final   Report Status PENDING  Incomplete  Blood Culture (routine x 2)     Status: None (Preliminary result)   Collection Time: 09/03/16  4:30 PM  Result Value Ref Range Status   Specimen Description BLOOD RIGHT ANTECUBITAL  Final   Special Requests BOTTLES DRAWN AEROBIC AND ANAEROBIC 5ML  Final   Culture NO GROWTH 2 DAYS  Final   Report Status PENDING  Incomplete  Urine culture     Status: None   Collection Time: 09/03/16  4:32 PM  Result Value Ref Range Status   Specimen Description URINE, RANDOM  Final   Special Requests NONE  Final   Culture NO GROWTH  Final   Report Status 09/05/2016 FINAL  Final  MRSA PCR Screening     Status: None   Collection Time: 09/03/16  8:00 PM  Result Value  Ref Range Status   MRSA by PCR NEGATIVE NEGATIVE Final    Comment:        The GeneXpert MRSA Assay (FDA approved for NASAL specimens only), is one component  of a comprehensive MRSA colonization surveillance program. It is not intended to diagnose MRSA infection nor to guide or monitor treatment for MRSA infections.      Labs: BNP (last 3 results)  Recent Labs  03/12/16 1255 06/23/16 1404 09/03/16 2004  BNP 262.8* 702.2* [id number]*   Basic Metabolic Panel:  Recent Labs Lab 09/03/16 1630 09/04/16 0304 09/05/16 0222 09/06/16 0226  NA 135 136 137 136  K 3.9 4.1 4.2 4.2  CL 98* 102 100* 98*  CO2 28 26 29 29   GLUCOSE 51* 222* 238* 293*  BUN 21* 21* 30* 37*  CREATININE 1.48* 1.27* 1.33* 1.46*  CALCIUM 8.8* 8.5* 8.6* 8.6*   Liver Function Tests: No results for input(s): AST, ALT, ALKPHOS, BILITOT, PROT, ALBUMIN in the last 168 hours. No results for input(s): LIPASE, AMYLASE in the last 168 hours. No results for input(s): AMMONIA in the last 168 hours. CBC:  Recent Labs Lab 09/03/16 1630 09/04/16 0304 09/05/16 0222  WBC 13.8* 9.7 11.6*  HGB 12.9* 11.4* 11.4*  HCT 40.5 36.3* 36.2*  MCV 82.8 82.5 81.9  PLT 204 163 216   Cardiac Enzymes: No results for input(s): CKTOTAL, CKMB, CKMBINDEX, TROPONINI in the last 168 hours. BNP: Invalid input(s): POCBNP CBG:  Recent Labs Lab 09/05/16 1116 09/05/16 1628 09/05/16 2130 09/06/16 0755 09/06/16 1148  GLUCAP 211* 268* 279* 231* 246*   D-Dimer No results for input(s): DDIMER in the last 72 hours. Hgb A1c No results for input(s): HGBA1C in the last 72 hours. Lipid Profile No results for input(s): CHOL, HDL, LDLCALC, TRIG, CHOLHDL, LDLDIRECT in the last 72 hours. Thyroid function studies No results for input(s): TSH, T4TOTAL, T3FREE, THYROIDAB in the last 72 hours.  Invalid input(s): FREET3 Anemia work up No results for input(s): VITAMINB12, FOLATE, FERRITIN, TIBC, IRON, RETICCTPCT in the last 72  hours. Urinalysis    Component Value Date/Time   COLORURINE YELLOW 11/28/2008 0325   APPEARANCEUR CLEAR 11/28/2008 0325   LABSPEC 1.023 11/28/2008 0325   PHURINE 5.5 11/28/2008 0325   GLUCOSEU NEGATIVE 11/28/2008 0325   HGBUR NEGATIVE 11/28/2008 0325   BILIRUBINUR NEGATIVE 11/28/2008 0325   KETONESUR NEGATIVE 11/28/2008 0325   PROTEINUR 30 (A) 11/28/2008 0325   UROBILINOGEN 0.2 11/28/2008 0325   NITRITE NEGATIVE 11/28/2008 0325   LEUKOCYTESUR NEGATIVE 11/28/2008 0325   Sepsis Labs Invalid input(s): PROCALCITONIN,  WBC,  LACTICIDVEN Microbiology Recent Results (from the past 240 hour(s))  Blood Culture (routine x 2)     Status: None (Preliminary result)   Collection Time: 09/03/16  4:30 PM  Result Value Ref Range Status   Specimen Description BLOOD LEFT ANTECUBITAL  Final   Special Requests BOTTLES DRAWN AEROBIC AND ANAEROBIC 5CC  Final   Culture NO GROWTH 2 DAYS  Final   Report Status PENDING  Incomplete  Blood Culture (routine x 2)     Status: None (Preliminary result)   Collection Time: 09/03/16  4:30 PM  Result Value Ref Range Status   Specimen Description BLOOD RIGHT ANTECUBITAL  Final   Special Requests BOTTLES DRAWN AEROBIC AND ANAEROBIC 5ML  Final   Culture NO GROWTH 2 DAYS  Final   Report Status PENDING  Incomplete  Urine culture     Status: None   Collection Time: 09/03/16  4:32 PM  Result Value Ref Range Status   Specimen Description URINE, RANDOM  Final   Special Requests NONE  Final   Culture NO GROWTH  Final   Report Status 09/05/2016 FINAL  Final  MRSA PCR  Screening     Status: None   Collection Time: 09/03/16  8:00 PM  Result Value Ref Range Status   MRSA by PCR NEGATIVE NEGATIVE Final    Comment:        The GeneXpert MRSA Assay (FDA approved for NASAL specimens only), is one component of a comprehensive MRSA colonization surveillance program. It is not intended to diagnose MRSA infection nor to guide or monitor treatment for MRSA infections.       Time coordinating discharge: Over 30 minutes  SIGNED:   Elmarie Shiley, MD  [facility] 09/06/2016, 1:10 PM Pager [phone]  If 7PM-7AM, please contact night-coverage [email or web address] Password [email or web address]

## 2016-09-06 NOTE — Progress Notes (Signed)
Patient called from room stating he was not going to be able to wear the C-Pap from the hospital due to the noise it was making. C-Pap removed and patient was set-up on Venti mask 35%. Sats remaining greater than 95%. Patient stated if he has to stay another night in the hospital he will have his family bring his C-Pap from home. Will continue to monitor closely.

## 2016-09-06 NOTE — Progress Notes (Signed)
Patient setup and placed on CPAP tolerating okay. Called back and wanted off at this time, due to leak to high and noise level. Will bring home mask for further use. Placed on venti Mask at 35% for sleep at this time. Patient resting well.

## 2016-09-06 NOTE — Progress Notes (Signed)
OT Cancellation Note  Patient Details Name: [name] MRN: [id number] DOB: August 19, 1934   Cancelled Treatment:    Reason Eval/Treat Not Completed: OT screened, no needs identified, will sign off.  [place], OTR/L [id number]   [name] 09/06/2016, 2:20 PM

## 2016-09-06 NOTE — Progress Notes (Signed)
Pharmacist Heart Failure Core Measure Documentation  Assessment: [name] has an EF documented as 35-40% on 01/11/16 by Echo.  Rationale: Heart failure patients with left ventricular systolic dysfunction (LVSD) and an EF < 40% should be prescribed an angiotensin converting enzyme inhibitor (ACEI) or angiotensin receptor blocker (ARB) at discharge unless a contraindication is documented in the medical record.  This patient is not currently on an ACEI or ARB for HF.  This note is being placed in the record in order to provide documentation that a contraindication to the use of these agents is present for this encounter.  ACE Inhibitor or Angiotensin Receptor Blocker is contraindicated (specify all that apply)  []   ACEI allergy AND ARB allergy []   Angioedema []   Moderate or severe aortic stenosis []   Hyperkalemia []   Hypotension []   Renal artery stenosis [x]   Worsening renal function, preexisting renal disease or dysfunction  Hildred Laser, Pharm D 09/06/2016 9:31 AM

## 2016-09-07 ENCOUNTER — Ambulatory Visit (INDEPENDENT_AMBULATORY_CARE_PROVIDER_SITE_OTHER): Payer: Medicare Other | Admitting: *Deleted

## 2016-09-07 ENCOUNTER — Telehealth: Payer: Self-pay

## 2016-09-07 ENCOUNTER — Encounter (INDEPENDENT_AMBULATORY_CARE_PROVIDER_SITE_OTHER): Payer: Self-pay

## 2016-09-07 DIAGNOSIS — I481 Persistent atrial fibrillation: Secondary | ICD-10-CM | POA: Diagnosis not present

## 2016-09-07 DIAGNOSIS — I4819 Other persistent atrial fibrillation: Secondary | ICD-10-CM

## 2016-09-07 LAB — CUP PACEART INCLINIC DEVICE CHECK
Battery Remaining Longevity: 46 mo
Battery Voltage: 2.93 V
Brady Statistic AP VP Percent: 91.06 %
Brady Statistic AP VS Percent: 0.54 %
Brady Statistic AS VP Percent: 4.37 %
Brady Statistic AS VS Percent: 4.03 %
Brady Statistic RA Percent Paced: 91.07 %
Brady Statistic RV Percent Paced: 94.21 %
Date Time Interrogation Session: 20171114164346
HighPow Impedance: 55 Ohm
HighPow Impedance: 72 Ohm
Implantable Lead Implant Date: 20091215
Implantable Lead Implant Date: 20091215
Implantable Lead Implant Date: 20091215
Implantable Lead Location: 753858
Implantable Lead Location: 753859
Implantable Lead Location: 753860
Implantable Lead Model: 4196
Implantable Lead Model: 5076
Implantable Lead Model: 6947
Implantable Pulse Generator Implant Date: 20150113
Lead Channel Impedance Value: 1539 Ohm
Lead Channel Impedance Value: 475 Ohm
Lead Channel Impedance Value: 532 Ohm
Lead Channel Impedance Value: 551 Ohm
Lead Channel Impedance Value: 703 Ohm
Lead Channel Impedance Value: 950 Ohm
Lead Channel Pacing Threshold Amplitude: 0.75 V
Lead Channel Pacing Threshold Amplitude: 0.875 V
Lead Channel Pacing Threshold Amplitude: 2 V
Lead Channel Pacing Threshold Pulse Width: 0.4 ms
Lead Channel Pacing Threshold Pulse Width: 0.4 ms
Lead Channel Pacing Threshold Pulse Width: 0.6 ms
Lead Channel Sensing Intrinsic Amplitude: 18.25 mV
Lead Channel Sensing Intrinsic Amplitude: 4.625 mV
Lead Channel Setting Pacing Amplitude: 1.75 V
Lead Channel Setting Pacing Amplitude: 1.75 V
Lead Channel Setting Pacing Amplitude: 2.5 V
Lead Channel Setting Pacing Pulse Width: 0.6 ms
Lead Channel Setting Pacing Pulse Width: 1 ms
Lead Channel Setting Sensing Sensitivity: 0.3 mV

## 2016-09-07 NOTE — Progress Notes (Signed)
CRT-D device check in office. Sensing consistent with previous device measurements. RV threshold elevated at 3V @ 0.29ms. RV lead impedance trend elevated at 1500ohms. 13 mode switch episodes recorded + coumadin. No ventricular arrhythmia episodes recorded. Patient bi-ventricularly pacing 94.9% of the time. Device programmed with appropriate safety margins. Heart failure diagnostics reviewed, trends elevated-recent pneumonia diagnosis. RV pulse width adjusted to 3ms, RV pace polarity adjusted to Tip to Coil. Max RV pacing lead impedance elevated to 3000ohms per WC. Estimated longevity 4.2yrs. ROV with AS 11/27. Patient education completed.

## 2016-09-07 NOTE — Telephone Encounter (Signed)
Received call from wife.  She reported patient was discharged from hospital yesterday about 3:30 pm and his device alarm went off around 7 pm and has since alarmed 3 more times.  Advised would have device team review and call her back.

## 2016-09-07 NOTE — Telephone Encounter (Signed)
Spoke with patient regarding auditory alert. I told her I have messaged the scheduler and she should expect a call. She verbalized understanding.

## 2016-09-08 ENCOUNTER — Ambulatory Visit: Payer: Medicare Other | Admitting: Internal Medicine

## 2016-09-08 LAB — CULTURE, BLOOD (ROUTINE X 2)
Culture: NO GROWTH
Culture: NO GROWTH

## 2016-09-08 NOTE — Progress Notes (Signed)
Pt with elevated impedence and threshold true bipolar. Extended bipolar with normal sensing, threshold, impedence.  Device information sent to tech services - fractured ring electrode. Spoke with patient's wife today. Will see patient again tomorrow when he is in office for Coumadin check and turn off tachy therapies.  Wife aware and agrees with plan.  Chanetta Marshall, NP 09/08/2016 12:56 PM

## 2016-09-09 ENCOUNTER — Encounter (INDEPENDENT_AMBULATORY_CARE_PROVIDER_SITE_OTHER): Payer: Self-pay

## 2016-09-09 ENCOUNTER — Ambulatory Visit (INDEPENDENT_AMBULATORY_CARE_PROVIDER_SITE_OTHER): Payer: Medicare Other | Admitting: *Deleted

## 2016-09-09 DIAGNOSIS — I4891 Unspecified atrial fibrillation: Secondary | ICD-10-CM | POA: Diagnosis not present

## 2016-09-09 DIAGNOSIS — Z95810 Presence of automatic (implantable) cardiac defibrillator: Secondary | ICD-10-CM

## 2016-09-09 DIAGNOSIS — I480 Paroxysmal atrial fibrillation: Secondary | ICD-10-CM

## 2016-09-09 DIAGNOSIS — Z7901 Long term (current) use of anticoagulants: Secondary | ICD-10-CM

## 2016-09-09 DIAGNOSIS — I255 Ischemic cardiomyopathy: Secondary | ICD-10-CM

## 2016-09-09 LAB — CUP PACEART INCLINIC DEVICE CHECK
Battery Remaining Longevity: 44 mo
Battery Voltage: 2.93 V
Brady Statistic AP VP Percent: 2.67 %
Brady Statistic AP VS Percent: 16.2 %
Brady Statistic AS VP Percent: 17.47 %
Brady Statistic AS VS Percent: 63.66 %
Brady Statistic RA Percent Paced: 17.18 %
Brady Statistic RV Percent Paced: 20.54 %
Date Time Interrogation Session: 20171116121329
HighPow Impedance: 52 Ohm
HighPow Impedance: 69 Ohm
Implantable Lead Implant Date: 20091215
Implantable Lead Implant Date: 20091215
Implantable Lead Implant Date: 20091215
Implantable Lead Location: 753858
Implantable Lead Location: 753859
Implantable Lead Location: 753860
Implantable Lead Model: 4196
Implantable Lead Model: 5076
Implantable Lead Model: 6947
Implantable Pulse Generator Implant Date: 20150113
Lead Channel Impedance Value: 1520 Ohm
Lead Channel Impedance Value: 475 Ohm
Lead Channel Impedance Value: 532 Ohm
Lead Channel Impedance Value: 551 Ohm
Lead Channel Impedance Value: 589 Ohm
Lead Channel Impedance Value: 931 Ohm
Lead Channel Pacing Threshold Amplitude: 0.75 V
Lead Channel Pacing Threshold Pulse Width: 0.6 ms
Lead Channel Sensing Intrinsic Amplitude: 15.875 mV
Lead Channel Sensing Intrinsic Amplitude: 2.625 mV
Lead Channel Setting Pacing Amplitude: 1.75 V
Lead Channel Setting Pacing Amplitude: 1.75 V
Lead Channel Setting Pacing Amplitude: 2.5 V
Lead Channel Setting Pacing Pulse Width: 0.6 ms
Lead Channel Setting Pacing Pulse Width: 1 ms
Lead Channel Setting Sensing Sensitivity: 0.3 mV

## 2016-09-09 LAB — POCT INR: INR: 2.7

## 2016-09-09 NOTE — Progress Notes (Signed)
CRT-D device check in office to reprogram RV sense polarity and program VT/VF therapies/detections off due to likely fractured ring electrode per AS/tech services. Thresholds not tested. Sensing consistent with previous device measurements, R-waves 15.10mV tip-coil. 100% AT/AF burden since last check on 09/07/16, +warfarin. No ventricular arrhythmia episodes recorded. Patient bi-ventricularly pacing 20.5% of the time, VSRP 79.3%, likely due to AF/AFl. VT and VF detection and therapies reprogrammed off, VT monitor zone turned off, RV sense polarity reprogrammed to tip to coil; all changes per AS. Estimated longevity 3.6 years.  Patient enrolled in remote follow up. ROV with JA on 09/20/16 to discuss AF/AFl.

## 2016-09-14 ENCOUNTER — Other Ambulatory Visit: Payer: Self-pay

## 2016-09-20 ENCOUNTER — Ambulatory Visit (INDEPENDENT_AMBULATORY_CARE_PROVIDER_SITE_OTHER): Payer: Medicare Other | Admitting: Internal Medicine

## 2016-09-20 ENCOUNTER — Encounter: Payer: Self-pay | Admitting: Internal Medicine

## 2016-09-20 ENCOUNTER — Encounter: Payer: Medicare Other | Admitting: Nurse Practitioner

## 2016-09-20 VITALS — BP 120/76 | HR 86 | Ht 68.0 in | Wt 234.2 lb

## 2016-09-20 DIAGNOSIS — I481 Persistent atrial fibrillation: Secondary | ICD-10-CM

## 2016-09-20 DIAGNOSIS — Z95810 Presence of automatic (implantable) cardiac defibrillator: Secondary | ICD-10-CM | POA: Diagnosis not present

## 2016-09-20 DIAGNOSIS — I255 Ischemic cardiomyopathy: Secondary | ICD-10-CM | POA: Diagnosis not present

## 2016-09-20 DIAGNOSIS — I5022 Chronic systolic (congestive) heart failure: Secondary | ICD-10-CM | POA: Diagnosis not present

## 2016-09-20 DIAGNOSIS — I10 Essential (primary) hypertension: Secondary | ICD-10-CM

## 2016-09-20 DIAGNOSIS — I4819 Other persistent atrial fibrillation: Secondary | ICD-10-CM

## 2016-09-20 LAB — CUP PACEART INCLINIC DEVICE CHECK
Battery Remaining Longevity: 41 mo
Battery Voltage: 2.93 V
Brady Statistic AP VP Percent: 58.41 %
Brady Statistic AP VS Percent: 11.57 %
Brady Statistic AS VP Percent: 7.52 %
Brady Statistic AS VS Percent: 22.5 %
Brady Statistic RA Percent Paced: 65.72 %
Brady Statistic RV Percent Paced: 62.64 %
Date Time Interrogation Session: 20171127145057
HighPow Impedance: 52 Ohm
HighPow Impedance: 68 Ohm
Implantable Lead Implant Date: 20091215
Implantable Lead Implant Date: 20091215
Implantable Lead Implant Date: 20091215
Implantable Lead Location: 753858
Implantable Lead Location: 753859
Implantable Lead Location: 753860
Implantable Lead Model: 4196
Implantable Lead Model: 5076
Implantable Lead Model: 6947
Implantable Pulse Generator Implant Date: 20150113
Lead Channel Impedance Value: 1615 Ohm
Lead Channel Impedance Value: 513 Ohm
Lead Channel Impedance Value: 551 Ohm
Lead Channel Impedance Value: 551 Ohm
Lead Channel Impedance Value: 608 Ohm
Lead Channel Impedance Value: 988 Ohm
Lead Channel Pacing Threshold Amplitude: 0.75 V
Lead Channel Pacing Threshold Amplitude: 0.75 V
Lead Channel Pacing Threshold Amplitude: 1 V
Lead Channel Pacing Threshold Pulse Width: 0.4 ms
Lead Channel Pacing Threshold Pulse Width: 0.6 ms
Lead Channel Pacing Threshold Pulse Width: 1 ms
Lead Channel Sensing Intrinsic Amplitude: 1.5 mV
Lead Channel Sensing Intrinsic Amplitude: 15 mV
Lead Channel Sensing Intrinsic Amplitude: 16.1 mV
Lead Channel Sensing Intrinsic Amplitude: 2 mV
Lead Channel Setting Pacing Amplitude: 1.75 V
Lead Channel Setting Pacing Amplitude: 2 V
Lead Channel Setting Pacing Amplitude: 2.5 V
Lead Channel Setting Pacing Pulse Width: 0.6 ms
Lead Channel Setting Pacing Pulse Width: 1 ms
Lead Channel Setting Sensing Sensitivity: 0.3 mV

## 2016-09-20 NOTE — Progress Notes (Signed)
Electrophysiology Office Note Date: 09/20/2016  ID:  [name], DOB 1933/10/28, MRN [id number]  PCP: Irven Shelling, MD Primary Cardiologist: Irish Lack Electrophysiologist: Neema Barreira  CC: ICD lead failure  [name] is a 82 y.o. male seen today for follow-up.  Recent remote transmission showed increased RV lead impedance and threshold.  These normalized with programming to integrated bipole which suggests failure of the ring electrode.  The device continues to work normally for pacing.  After long and careful conversation with the patient, we elected to turn tachy detections and ICD therapy off to prevent the possibility of inappropriate shocks should the lead fail further.  He has done well with this approach. He has had recent afib/ atypical atrial flutter but is currently in sinus rhythm.  He denies chest pain, palpitations, PND, orthopnea, dizziness, syncope, edema, weight gain, or early satiety.    Device History: MDT CRTD implanted 2009 for ICM, CHF History of appropriate therapy: No History of AAD therapy: yes - amiodarone for AF    Past Medical History:  Diagnosis Date  . Atrial fibrillation ([place])    persistent, previously seen at [facility] and placed on amiodarone  . Benign prostatic hypertrophy   . CAD (coronary artery disease)    multivessel s/p inferolateral wall MI with subsequent CABG 11/1998.  Cath 2009 with Patent grafts  . Cleft palate   . COPD with emphysema ([place]) 04/01/2010  . DM (diabetes mellitus), type 2 ([place])   . Dyspnea   . GERD (gastroesophageal reflux disease)   . HTN (hypertension)   . Hyperlipidemia   . Hypothyroidism   . Iron deficiency anemia   . Ischemic dilated cardiomyopathy ([place])    EF 35-40% by MUGA 6/11  . Myocardial infarction   . Nasal septal deviation   . Nephrolithiasis   . OSA (obstructive sleep apnea)   . PAF (paroxysmal atrial fibrillation) ([place])   . Peripheral arterial disease ([facility])    left subclavian artery  stenosis  . PNA (pneumonia)   . Psoriasis   . Seborrheic keratosis   . Stroke ([place])   . Systolic congestive heart failure ([place]) 2009   s/p BiV ICD implantation by Dr Leonia Reeves (MDT)   Past Surgical History:  Procedure Laterality Date  . BI-VENTRICULAR IMPLANTABLE CARDIOVERTER DEFIBRILLATOR  (CRT-D)  10-08-08; 11-06-2013   Dr Leonia Reeves (MDT) implant for primary prevention; gen change to MDT VivaXT CRTD by Dr Rayann Heman  . BIV ICD GENERTAOR CHANGE OUT N/A 11/06/2013   Procedure: BIV ICD GENERTAOR CHANGE OUT;  Surgeon: Coralyn Mark, MD;  Location: [facility] CATH LAB;  Service: Cardiovascular;  Laterality: N/A;  . c-spine surgery    . CARDIOVERSION N/A 04/29/2016   Procedure: CARDIOVERSION;  Surgeon: Pixie Casino, MD;  Location: [facility] ENDOSCOPY;  Service: Cardiovascular;  Laterality: N/A;  . CARPAL TUNNEL RELEASE    . CATARACT EXTRACTION    . CORONARY ARTERY BYPASS GRAFT     LIMA to LAD, SVG to OM, SVG to diagonal  . ELECTROPHYSIOLOGIC STUDY N/A 06/11/2016   Procedure: Atrial Fibrillation Ablation;  Surgeon: Thompson Grayer, MD;  Location: [facility] CV LAB;  Service: Cardiovascular;  Laterality: N/A;  . left cleft palate and left cleft lip repair      Current Outpatient Prescriptions  Medication Sig Dispense Refill  . acetaminophen (TYLENOL) 500 MG tablet Take 1,000 mg by mouth See admin instructions. Take 2 tablets (1000 mg) by mouth daily at bedtime, may also take 2 tablets every 6 hours as needed for  pain/fever    . albuterol (PROVENTIL HFA;VENTOLIN HFA) 108 (90 BASE) MCG/ACT inhaler Inhale 2 puffs into the lungs every 6 (six) hours as needed for wheezing or shortness of breath. Must keep June 2016 appt. 1 Inhaler 0  . albuterol (PROVENTIL) (2.5 MG/3ML) 0.083% nebulizer solution USE 1 VIAL VIA NEBULIZER 4 TIMES A DAY AS DIRECTED 300 mL 2  . amiodarone (PACERONE) 200 MG tablet Take 200 mg by mouth daily.    [name] aspirin EC 81 MG tablet Take 81 mg by mouth daily.    [name] atorvastatin (LIPITOR) 80 MG tablet TAKE  1 TABLET (80 MG TOTAL) BY MOUTH DAILY. 90 tablet 2  . bisacodyl (DULCOLAX) 5 MG EC tablet Take 2 tablets (10 mg total) by mouth daily at 12 noon. 30 tablet 0  . carvedilol (COREG) 6.25 MG tablet TAKE 1 TABLET BY MOUTH TWICE A DAY 180 tablet 2  . ezetimibe (ZETIA) 10 MG tablet Take 10 mg by mouth daily.    . ferrous sulfate 325 (65 FE) MG tablet Take 325 mg by mouth daily with breakfast.    . fluticasone (FLONASE) 50 MCG/ACT nasal spray PLACE 2 SPRAYS INTO BOTH NOSTRILS DAILY. 16 g 2  . furosemide (LASIX) 40 MG tablet Take 40 mg by mouth 2 (two) times daily.    . insulin aspart (NOVOLOG FLEXPEN) 100 UNIT/ML FlexPen Inject 6 Units into the skin 3 (three) times daily before meals.    . insulin degludec (TRESIBA FLEXTOUCH) 100 UNIT/ML SOPN FlexTouch Pen Inject 0.3 mLs (30 Units total) into the skin daily before lunch. 1 pen 0  . levothyroxine (SYNTHROID, LEVOTHROID) 100 MCG tablet Take 100 mcg by mouth daily before breakfast.     . Multiple Vitamin (MULTIVITAMIN WITH MINERALS) TABS tablet Take 1 tablet by mouth daily.    [name] omeprazole (PRILOSEC) 20 MG capsule Take 20 mg by mouth daily.      [name] PRESCRIPTION MEDICATION Inhale into the lungs at bedtime. CPAP    . senna (SENOKOT) 8.6 MG tablet Take 2 tablets by mouth at bedtime.     [name] spironolactone (ALDACTONE) 25 MG tablet TAKE 1/2 TABLET (12.5 MG TOTAL) BY MOUTH DAILY. 45 tablet 1  . Tiotropium Bromide Monohydrate (SPIRIVA RESPIMAT) 2.5 MCG/ACT AERS Inhale 2 puffs into the lungs daily. 1 Inhaler 5  . traMADol (ULTRAM) 50 MG tablet Take 1 tablet (50 mg total) by mouth every 6 (six) hours as needed for moderate pain. 30 tablet 0  . vitamin B-12 (CYANOCOBALAMIN) 1000 MCG tablet Take 1,000 mcg by mouth daily.    [name] warfarin (COUMADIN) 5 MG tablet TAKE AS DIRECTED BY COUMADIN CLINIC (Patient taking differently: TAKE 1/2 TABLET BY MOUTH ON MONDAY NIGHT, TAKE 1 TABLET ON ALL OTHER NIGHTS OF THE WEEK OR  AS DIRECTED BY COUMADIN CLINIC) 40 tablet 3   No current  facility-administered medications for this visit.     Allergies:   Aldactone [spironolactone]   Social History: Social History   Social History  . Marital status: Married    Spouse name: N/A  . Number of children: N/A  . Years of education: N/A   Occupational History  . retired     Engineer, agricultural   Social History Main Topics  . Smoking status: Former Smoker    Packs/day: 3.00    Years: 65.00    Types: Cigarettes    Quit date: 10/25/1998  . Smokeless tobacco: Never Used  . Alcohol use No     Comment: remote history of heavy  alcohol use  . Drug use: No  . Sexual activity: Not on file   Other Topics Concern  . Not on file   Social History Narrative   Lives [place]   Retired    Family History: Family History  Problem Relation Age of Onset  . Asthma Father   . Stroke Father   . Hypertension Father   . Hypertension Mother   . Heart disease Brother   . Heart attack Brother   . Hypertension Sister   . Hypertension Brother     Review of Systems: All other systems reviewed and are otherwise negative except as noted above.   Physical Exam: VS:  BP 120/76   Pulse 86   Ht 5\' 8"  (1.727 m)   Wt 234 lb 3.2 oz (106.2 kg)   BMI 35.61 kg/m  , BMI Body mass index is 35.61 kg/m.  GEN- The patient is obese, elderly and frail appearing appearing, alert and oriented x 3 today.   HEENT: normocephalic, atraumatic; sclera clear, conjunctiva pink; hearing intact; oropharynx clear; neck supple  Lungs- Clear to ausculation bilaterally, normal work of breathing.  No wheezes, rales, rhonchi Heart- Regular rate and rhythm (paced) GI- soft, non-tender, non-distended, bowel sounds present  Extremities- no clubbing, cyanosis, or edema; DP/PT/radial pulses 2+ bilaterally MS- no significant deformity or atrophy Skin- warm and dry, no rash or lesion; ICD pocket well healed Psych- euthymic mood, full affect Neuro- strength and sensation are intact  ICD interrogation- reviewed in  detail today,  See PACEART report  EKG:  EKG is ordered today. The ekg ordered today shows AV paced rhythm  Recent Labs: 04/21/2016: ALT 16; TSH 2.14 09/03/2016: B Natriuretic Peptide 552.6 09/05/2016: Hemoglobin 11.4; Platelets 216 09/06/2016: BUN 37; Creatinine, Ser 1.46; Potassium 4.2; Sodium 136   Wt Readings from Last 3 Encounters:  09/20/16 234 lb 3.2 oz (106.2 kg)  09/03/16 240 lb 8.4 oz (109.1 kg)  08/16/16 243 lb 6.4 oz (110.4 kg)    Assessment and Plan:  1.  Persistent atrial fibrillation He has had some ERAF but continues to do reasonably well post ablation No changes today  2. Chronic systolic dysfunction ICD RV lead failure noted.  I have turned ICD therapies off after long discussion with the patient and his spouse.  BiV pacing continues to work normally.  He is clear that he is not interested in lead revision at this time.  Given his advanced age and comoribidites, I would favor this approach. See Pace Art report No changes today  3.  OSA CPAP compliance encouraged  4.  CAD No recent ischemic symptoms Will defer need for concomitant ASA to Dr Irish Lack.  He has diffuse ecchymosis and would probably do better off of asa if ok with Dr Irish Lack.  5.  HTN Stable No change required today  6.  Obesity Weight loss encouraged  Follow-up with Dr Irish Lack as scheduled carelink Return to see EP NP in 6 weeks I will see again in 3 months  Signed, Thompson Grayer, MD   09/20/2016  [address] [place] [address] [phone] (office) [phone] (fax)

## 2016-09-20 NOTE — Patient Instructions (Addendum)
Medication Instructions:  Your physician recommends that you continue on your current medications as directed. Please refer to the Current Medication list given to you today.   Labwork: None ordered   Testing/Procedures: None ordered   Follow-Up:  Your physician recommends that you schedule a follow-up appointment in: 6 weeks with Chanetta Marshall, NP and 3 months with Dr Rayann Heman   Any Other Special Instructions Will Be Listed Below (If Applicable).     If you need a refill on your cardiac medications before your next appointment, please call your pharmacy.

## 2016-09-22 DIAGNOSIS — L602 Onychogryphosis: Secondary | ICD-10-CM | POA: Diagnosis not present

## 2016-09-22 DIAGNOSIS — J189 Pneumonia, unspecified organism: Secondary | ICD-10-CM | POA: Diagnosis not present

## 2016-09-22 DIAGNOSIS — E1151 Type 2 diabetes mellitus with diabetic peripheral angiopathy without gangrene: Secondary | ICD-10-CM | POA: Diagnosis not present

## 2016-09-22 DIAGNOSIS — L84 Corns and callosities: Secondary | ICD-10-CM | POA: Diagnosis not present

## 2016-09-28 ENCOUNTER — Other Ambulatory Visit: Payer: Self-pay | Admitting: Pulmonary Disease

## 2016-09-29 NOTE — Progress Notes (Signed)
Cardiology Office Note   Date:  09/30/2016   ID:  [name], DOB 1934-01-11, MRN [id number]  PCP:  Irven Shelling, MD    No chief complaint on file. CAD   Wt Readings from Last 3 Encounters:  09/30/16 233 lb (105.7 kg)  09/20/16 234 lb 3.2 oz (106.2 kg)  09/03/16 240 lb 8.4 oz (109.1 kg)       History of Present Illness: [name] is a 82 y.o. male  Who has a history of COPD on CPAP, left subclavian stenosis, CHF ( systolic, EF [id number] in [id number]), Afib s/p biventricular defibrillator, CAD s/p CABG, hypothyroidism, HTN, CVA.  Admitted 01/16/16 with acute resp. Failure with hypoxia acute COPD exacerbation RLL PNA, and acute systolic HF.Chronic systolic CHF: EF 123456 -Weight stable/decreasing  Paroxsymal Afib At discharge in 3/17: - Currently in AV paced rhythm on amiodarone. - CHADSVASC of 8, A/C with warfarin, theraputic AoCKD3- improving - ARB held,  -Resumed home oral lasix 40mg  BID  He failed attempts at restoring NSR with Dr. Rayann Heman.  Ablation was done- see below.  ? Raised by EP whether he still needs aspirin.     He has had bowel obstruction.  He had some type of colon cleanse with relief.  He is back to eating regular foods.  He had a BM this AM.    He has lost 15 lbs in 2017 through portion control.   He had an AFib ablation and flutter ablation in 8/17.  Now with lots of bruising on anticoagulation and antiplatelet therapy.  He had some AFib with pneumonia.    One lead of his device is not functioning optimally.    He would like to come off of the aspirin.      Past Medical History:  Diagnosis Date  . Atrial fibrillation ([place])    persistent, previously seen at [facility] and placed on amiodarone  . Benign prostatic hypertrophy   . CAD (coronary artery disease)    multivessel s/p inferolateral wall MI with subsequent CABG 11/1998.  Cath 2009 with Patent grafts  . Cleft palate   . COPD with emphysema ([place]) 04/01/2010  . DM (diabetes  mellitus), type 2 ([place])   . Dyspnea   . GERD (gastroesophageal reflux disease)   . HTN (hypertension)   . Hyperlipidemia   . Hypothyroidism   . Iron deficiency anemia   . Ischemic dilated cardiomyopathy ([place])    EF 35-40% by MUGA 6/11  . Myocardial infarction   . Nasal septal deviation   . Nephrolithiasis   . OSA (obstructive sleep apnea)   . PAF (paroxysmal atrial fibrillation) ([place])   . Peripheral arterial disease (HCC)    left subclavian artery stenosis  . PNA (pneumonia)   . Psoriasis   . Seborrheic keratosis   . Stroke ([place])   . Systolic congestive heart failure ([place]) 2009   s/p BiV ICD implantation by Dr Leonia Reeves (MDT)    Past Surgical History:  Procedure Laterality Date  . BI-VENTRICULAR IMPLANTABLE CARDIOVERTER DEFIBRILLATOR  (CRT-D)  10-08-08; 11-06-2013   Dr Leonia Reeves (MDT) implant for primary prevention; gen change to MDT VivaXT CRTD by Dr Rayann Heman  . BIV ICD GENERTAOR CHANGE OUT N/A 11/06/2013   Procedure: BIV ICD GENERTAOR CHANGE OUT;  Surgeon: Coralyn Mark, MD;  Location: [facility] CATH LAB;  Service: Cardiovascular;  Laterality: N/A;  . c-spine surgery    . CARDIOVERSION N/A 04/29/2016   Procedure: CARDIOVERSION;  Surgeon: Pixie Casino, MD;  Location: [facility]  ENDOSCOPY;  Service: Cardiovascular;  Laterality: N/A;  . CARPAL TUNNEL RELEASE    . CATARACT EXTRACTION    . CORONARY ARTERY BYPASS GRAFT     LIMA to LAD, SVG to OM, SVG to diagonal  . ELECTROPHYSIOLOGIC STUDY N/A 06/11/2016   Procedure: Atrial Fibrillation Ablation;  Surgeon: Thompson Grayer, MD;  Location: [place] CV LAB;  Service: Cardiovascular;  Laterality: N/A;  . left cleft palate and left cleft lip repair       Current Outpatient Prescriptions  Medication Sig Dispense Refill  . acetaminophen (TYLENOL) 500 MG tablet Take 1,000 mg by mouth See admin instructions. Take 2 tablets (1000 mg) by mouth daily at bedtime, may also take 2 tablets every 6 hours as needed for pain/fever    . albuterol (PROVENTIL  HFA;VENTOLIN HFA) 108 (90 BASE) MCG/ACT inhaler Inhale 2 puffs into the lungs every 6 (six) hours as needed for wheezing or shortness of breath. Must keep June 2016 appt. 1 Inhaler 0  . albuterol (PROVENTIL) (2.5 MG/3ML) 0.083% nebulizer solution USE 1 VIAL VIA NEBULIZER 4 TIMES A DAY AS DIRECTED 300 mL 2  . amiodarone (PACERONE) 200 MG tablet Take 200 mg by mouth daily.    [name] atorvastatin (LIPITOR) 80 MG tablet TAKE 1 TABLET (80 MG TOTAL) BY MOUTH DAILY. 90 tablet 2  . bisacodyl (DULCOLAX) 5 MG EC tablet Take 2 tablets (10 mg total) by mouth daily at 12 noon. 30 tablet 0  . carvedilol (COREG) 6.25 MG tablet TAKE 1 TABLET BY MOUTH TWICE A DAY 180 tablet 2  . ezetimibe (ZETIA) 10 MG tablet Take 10 mg by mouth daily.    . ferrous sulfate 325 (65 FE) MG tablet Take 325 mg by mouth daily with breakfast.    . fluticasone (FLONASE) 50 MCG/ACT nasal spray PLACE 2 SPRAYS INTO BOTH NOSTRILS DAILY. 16 g 2  . furosemide (LASIX) 40 MG tablet Take 40 mg by mouth 2 (two) times daily.    . insulin aspart (NOVOLOG FLEXPEN) 100 UNIT/ML FlexPen Inject 6 Units into the skin 3 (three) times daily before meals.    . insulin degludec (TRESIBA FLEXTOUCH) 100 UNIT/ML SOPN FlexTouch Pen Inject 0.3 mLs (30 Units total) into the skin daily before lunch. 1 pen 0  . levothyroxine (SYNTHROID, LEVOTHROID) 100 MCG tablet Take 100 mcg by mouth daily before breakfast.     . Multiple Vitamin (MULTIVITAMIN WITH MINERALS) TABS tablet Take 1 tablet by mouth daily.    [name] omeprazole (PRILOSEC) 20 MG capsule Take 20 mg by mouth daily.      [name] PRESCRIPTION MEDICATION Inhale into the lungs at bedtime. CPAP    . senna (SENOKOT) 8.6 MG tablet Take 2 tablets by mouth at bedtime.     [name] SPIRIVA RESPIMAT 2.5 MCG/ACT AERS INHALE 2 PUFFS INTO THE LUNGS DAILY. 1 Inhaler 5  . spironolactone (ALDACTONE) 25 MG tablet TAKE 1/2 TABLET (12.5 MG TOTAL) BY MOUTH DAILY. 45 tablet 1  . traMADol (ULTRAM) 50 MG tablet Take 1 tablet (50 mg total) by mouth every 6  (six) hours as needed for moderate pain. 30 tablet 0  . vitamin B-12 (CYANOCOBALAMIN) 1000 MCG tablet Take 1,000 mcg by mouth daily.    [name] warfarin (COUMADIN) 5 MG tablet TAKE AS DIRECTED BY COUMADIN CLINIC (Patient taking differently: TAKE 1/2 TABLET BY MOUTH ON MONDAY NIGHT, TAKE 1 TABLET ON ALL OTHER NIGHTS OF THE WEEK OR  AS DIRECTED BY COUMADIN CLINIC) 40 tablet 3   No current facility-administered medications for  this visit.     Allergies:   Aldactone [spironolactone]    Social History:  The patient  reports that he quit smoking about 17 years ago. His smoking use included Cigarettes. He has a 195.00 pack-year smoking history. He has never used smokeless tobacco. He reports that he does not drink alcohol or use drugs.   Family History:  The patient's family history includes Asthma in his father; Heart attack in his brother; Heart disease in his brother; Hypertension in his brother, father, mother, and sister; Stroke in his father.    ROS:  Please see the history of present illness.   Otherwise, review of systems are positive for DOE.   All other systems are reviewed and negative.    PHYSICAL EXAM: VS:  BP 100/65   Pulse 74   Ht 5\' 8"  (1.727 m)   Wt 233 lb (105.7 kg)   BMI 35.43 kg/m  , BMI Body mass index is 35.43 kg/m. GEN: Well nourished, well developed, in no acute distress  HEENT: normal  Neck: no JVD, carotid bruits, or masses Cardiac: RRR; no murmurs, rubs, or gallops,no edema , mildly decreased left radial pulse Respiratory:  clear to auscultation bilaterally, normal work of breathing GI: soft, nontender, nondistended, + BS MS: no deformity or atrophy  Skin: warm and dry, no rash; bruising Neuro:  Strength and sensation are intact Psych: euthymic mood, full affect   Recent Labs: 04/21/2016: ALT 16; TSH 2.14 09/03/2016: B Natriuretic Peptide 552.6 09/05/2016: Hemoglobin 11.4; Platelets 216 09/06/2016: BUN 37; Creatinine, Ser 1.46; Potassium 4.2; Sodium 136    Lipid Panel    Component Value Date/Time   CHOL 137 11/12/2014 0921   TRIG 95 11/12/2014 0921   HDL 50 11/12/2014 0921   CHOLHDL 2.6 11/29/2008 0330   VLDL 10 11/29/2008 0330   LDLCALC 68 11/12/2014 0921     Other studies Reviewed: Additional studies/ records that were reviewed today with results demonstrating: AFib ablation records.   ASSESSMENT AND PLAN:  1. CAD: No angina at this time. Continue aggressive secondary prevention. Known left subclavian stenosis with a LIMA present. No anginal symptoms. He has been evaluated by Dr. Gwenlyn Found in the past.  OK to increase exercise gradually.  Ok to stop aspirin now given the severe bruising that he has.  2. Hyperlipidemia: LDL controlled in 2016.  Followed with PMD. 3. HTN: Well controlled. Continue current medicines. 4. Edema: Controlled.  Elevate legs and continue diuretics. 5. SHOB: Likely related to COPD. Watch for fluid overload given chronic systolic dysfunction/ischemic cardiomyopathy. Chronic. Appears  euvolemic. 6. Continue with routine defibrillator checks., Dr. Caryl Comes Dr. Rayann Heman.  Lead issue followed closely. 7. Paroxysmal Atrial fibrillation: Coumadin for stroke prevention.  S/p Fib/fluttter ablation.  Given bruising, would stop aspirin.   Current medicines are reviewed at length with the patient today.  The patient concerns regarding his medicines were addressed.  The following changes have been made:  No change  Labs/ tests ordered today include:  No orders of the defined types were placed in this encounter.   Recommend 150 minutes/week of aerobic exercise Low fat, low carb, high fiber diet recommended  Disposition:   FU in 12 months   Signed, Larae Grooms, MD  09/30/2016 9:32 AM    [facility] [place], [place], [place]  [address] Phone: [phone]; Fax: [phone]

## 2016-09-30 ENCOUNTER — Ambulatory Visit (INDEPENDENT_AMBULATORY_CARE_PROVIDER_SITE_OTHER): Payer: Medicare Other | Admitting: Interventional Cardiology

## 2016-09-30 ENCOUNTER — Encounter: Payer: Self-pay | Admitting: Interventional Cardiology

## 2016-09-30 ENCOUNTER — Ambulatory Visit (INDEPENDENT_AMBULATORY_CARE_PROVIDER_SITE_OTHER): Payer: Medicare Other | Admitting: *Deleted

## 2016-09-30 VITALS — BP 100/65 | HR 74 | Ht 68.0 in | Wt 233.0 lb

## 2016-09-30 DIAGNOSIS — I480 Paroxysmal atrial fibrillation: Secondary | ICD-10-CM

## 2016-09-30 DIAGNOSIS — I5022 Chronic systolic (congestive) heart failure: Secondary | ICD-10-CM

## 2016-09-30 DIAGNOSIS — Z7901 Long term (current) use of anticoagulants: Secondary | ICD-10-CM | POA: Diagnosis not present

## 2016-09-30 DIAGNOSIS — I255 Ischemic cardiomyopathy: Secondary | ICD-10-CM | POA: Diagnosis not present

## 2016-09-30 DIAGNOSIS — I739 Peripheral vascular disease, unspecified: Secondary | ICD-10-CM

## 2016-09-30 DIAGNOSIS — I2510 Atherosclerotic heart disease of native coronary artery without angina pectoris: Secondary | ICD-10-CM | POA: Diagnosis not present

## 2016-09-30 DIAGNOSIS — I4891 Unspecified atrial fibrillation: Secondary | ICD-10-CM | POA: Diagnosis not present

## 2016-09-30 LAB — POCT INR: INR: 4.8

## 2016-09-30 NOTE — Patient Instructions (Signed)
Medication Instructions:  Stop taking Aspirin. All other medications remain the same.  Labwork: None  Testing/Procedures: None  Follow-Up: Your physician wants you to follow-up in: 1 year. You will receive a reminder letter in the mail two months in advance. If you don't receive a letter, please call our office to schedule the follow-up appointment.     If you need a refill on your cardiac medications before your next appointment, please call your pharmacy.

## 2016-10-04 ENCOUNTER — Other Ambulatory Visit: Payer: Self-pay | Admitting: Pulmonary Disease

## 2016-10-04 DIAGNOSIS — M8588 Other specified disorders of bone density and structure, other site: Secondary | ICD-10-CM | POA: Diagnosis not present

## 2016-10-04 DIAGNOSIS — S32010D Wedge compression fracture of first lumbar vertebra, subsequent encounter for fracture with routine healing: Secondary | ICD-10-CM | POA: Diagnosis not present

## 2016-10-07 ENCOUNTER — Ambulatory Visit (INDEPENDENT_AMBULATORY_CARE_PROVIDER_SITE_OTHER): Payer: Medicare Other | Admitting: *Deleted

## 2016-10-07 ENCOUNTER — Ambulatory Visit (INDEPENDENT_AMBULATORY_CARE_PROVIDER_SITE_OTHER): Payer: Medicare Other

## 2016-10-07 DIAGNOSIS — I480 Paroxysmal atrial fibrillation: Secondary | ICD-10-CM | POA: Diagnosis not present

## 2016-10-07 DIAGNOSIS — I5022 Chronic systolic (congestive) heart failure: Secondary | ICD-10-CM

## 2016-10-07 DIAGNOSIS — I255 Ischemic cardiomyopathy: Secondary | ICD-10-CM | POA: Diagnosis not present

## 2016-10-07 DIAGNOSIS — Z95810 Presence of automatic (implantable) cardiac defibrillator: Secondary | ICD-10-CM | POA: Diagnosis not present

## 2016-10-07 LAB — POCT INR: INR: 2.6

## 2016-10-08 NOTE — Progress Notes (Signed)
Call to wife.  Advised would call her back if any recommendations are made by MD.  Advised use ER if needed over the weekend.  Patient has a cough but no other fluid symptoms at this time.

## 2016-10-08 NOTE — Progress Notes (Signed)
EPIC Encounter for ICM Monitoring  Patient Name: [name] is a 82 y.o. male Date: 10/08/2016 Primary Care Physican: Irven Shelling, MD Primary [place] Electrophysiologist: Allred Dry Weight:unknown Bi-V Pacing: 99%        Spoke with wife.  Heart Failure questions reviewed, pt has dry cough but wife thinks he is trying to catch a cold.   Thoracic impedance abnormal suggesting fluid accumulation.  Currently taking Furosemide 40 mg bid  Labs: 09/06/2016 Creatinine 1.46, BUN 37, Potassium 4.2, Sodium 136, EGFR 44-51 09/05/2016 Creatinine 1.33, BUN 30, Potassium 4.2, Sodium 137, EGFR 49-57  09/04/2016 Creatinine 1.27, BUN 21, Potassium 4.1, Sodium 136, EGFR 52-60  09/03/2016 Creatinine 1.48, BUN 21, Potassium 3.9, Sodium 135, EGFR 43-50  07/02/2016 Creatinine 1.69, BUN 23, Potassium 4.6, Sodium 140  06/23/2016 Creatinine 1.68, BUN 26, Potassium 4.9, Sodium 138  06/02/2016 Creatinine 1.58, BUN 27, Potassium 5.3, Sodium 139  04/21/2016 Creatinine 1.52, BUN 22, Potassium 4.1, Sodium 138  03/12/2016 Creatinine 1.52, BUN 27, Potassium 4.1, Sodium 139,    Recommendations:    Copy of ICM check sent to Dr Irish Lack and Dr Rayann Heman for recommendation.   Follow-up plan: ICM clinic phone appointment on 10/14/2016 to recheck fluid levels.  Office appointment with Chanetta Marshall, NP on 10/29/2016    ICM trend: 10/07/2016       Rosalene Billings, RN 10/08/2016 1:15 PM

## 2016-10-11 NOTE — Progress Notes (Signed)
Received: Today  Message Contents  Jettie Booze, MD  Rosalene Billings, RN        OK to increase Lasix to 80mg  BID for 2 days and then back to 40 mg BID.

## 2016-10-11 NOTE — Progress Notes (Signed)
Call to wife and advised Dr Irish Lack recommended he increase Furosemide to 80 mg bid x 2 days and then return to 40 mg bid.  She verbalized understanding.  Recheck fluid levels on 10/14/2016.  Advised to call back if he has any difficulty with taking extra Furosemide.

## 2016-10-14 ENCOUNTER — Ambulatory Visit (INDEPENDENT_AMBULATORY_CARE_PROVIDER_SITE_OTHER): Payer: Medicare Other

## 2016-10-14 DIAGNOSIS — I5022 Chronic systolic (congestive) heart failure: Secondary | ICD-10-CM

## 2016-10-14 DIAGNOSIS — Z95810 Presence of automatic (implantable) cardiac defibrillator: Secondary | ICD-10-CM | POA: Diagnosis not present

## 2016-10-14 NOTE — Progress Notes (Signed)
EPIC Encounter for ICM Monitoring  Patient Name: [name] is a 82 y.o. male Date: 10/14/2016 Primary Care Physican: Irven Shelling, MD Primary [place] Electrophysiologist: Allred Dry [id number] lbs Bi-V Pacing: 99%          Heart Failure questions reviewed, pt asymptomatic   Thoracic impedance returned to normal after increase in Furosemide x 2 days.    Recommendations:  No changes.  Reinforced to limit low salt food choices to 2000 mg day and limiting fluid intake to < 2 liters per day. Encouraged to call for fluid symptoms.    Follow-up plan: ICM clinic phone appointment on 11/30/2016.  Office appointment 10/29/2016.  Copy of ICM check sent to primary cardiologist and device physician.   ICM trend: 10/14/2016       Rosalene Billings, RN 10/14/2016 2:21 PM

## 2016-10-19 ENCOUNTER — Telehealth: Payer: Self-pay

## 2016-10-19 NOTE — Telephone Encounter (Signed)
Spoke w/ pts wife and requested pt to send a manual transmission d/t an alert received from pts home monitor, pts wife stated that she would have pt send in a manual transmission.

## 2016-10-20 ENCOUNTER — Other Ambulatory Visit: Payer: Self-pay | Admitting: Internal Medicine

## 2016-10-21 ENCOUNTER — Ambulatory Visit (INDEPENDENT_AMBULATORY_CARE_PROVIDER_SITE_OTHER): Payer: Medicare Other

## 2016-10-21 DIAGNOSIS — I480 Paroxysmal atrial fibrillation: Secondary | ICD-10-CM

## 2016-10-21 DIAGNOSIS — I255 Ischemic cardiomyopathy: Secondary | ICD-10-CM | POA: Diagnosis not present

## 2016-10-21 LAB — POCT INR: INR: 3.1

## 2016-10-27 NOTE — Progress Notes (Signed)
Electrophysiology Office Note Date: 10/29/2016  ID:  [name], DOB Nov 25, 1933, MRN [id number]  PCP: Irven Shelling, MD Primary Cardiologist: Irish Lack Electrophysiologist: Allred   CC: routine ICD check   [name] is a 83 y.o. male seen today for Dr Rayann Heman.  He presents today for routine electrophysiology followup.  Since last being seen in our clinic, the patient reports doing reasonably well.  He has had some increased shortness of breath and productive cough recently.  He has not had orthopnea, edema, PND.   He denies chest pain, palpitations, dizziness, syncope, edema, weight gain, or early satiety.  He has not had ICD shocks.    Device History: MDT CRTD implanted 2009 for ICM, CHF History of appropriate therapy: No History of AAD therapy: yes - amiodarone for AF    Past Medical History:  Diagnosis Date  . Atrial fibrillation ([place])    persistent, previously seen at [facility] and placed on amiodarone  . Benign prostatic hypertrophy   . CAD (coronary artery disease)    multivessel s/p inferolateral wall MI with subsequent CABG 11/1998.  Cath 2009 with Patent grafts  . Cleft palate   . COPD with emphysema ([place]) 04/01/2010  . DM (diabetes mellitus), type 2 ([place])   . Dyspnea   . GERD (gastroesophageal reflux disease)   . HTN (hypertension)   . Hyperlipidemia   . Hypothyroidism   . Iron deficiency anemia   . Ischemic dilated cardiomyopathy ([place])    EF 35-40% by MUGA 6/11  . Myocardial infarction   . Nasal septal deviation   . Nephrolithiasis   . OSA (obstructive sleep apnea)   . PAF (paroxysmal atrial fibrillation) ([place])   . Peripheral arterial disease (H[facility])    left subclavian artery stenosis  . PNA (pneumonia)   . Psoriasis   . Seborrheic keratosis   . Stroke ([place])   . Systolic congestive heart failure ([place]) 2009   s/p BiV ICD implantation by Dr Leonia Reeves (MDT)   Past Surgical History:  Procedure Laterality Date  . BI-VENTRICULAR IMPLANTABLE  CARDIOVERTER DEFIBRILLATOR  (CRT-D)  10-08-08; 11-06-2013   Dr Leonia Reeves (MDT) implant for primary prevention; gen change to MDT VivaXT CRTD by Dr Rayann Heman  . BIV ICD GENERTAOR CHANGE OUT N/A 11/06/2013   Procedure: BIV ICD GENERTAOR CHANGE OUT;  Surgeon: Coralyn Mark, MD;  Location: [facility] CATH LAB;  Service: Cardiovascular;  Laterality: N/A;  . c-spine surgery    . CARDIOVERSION N/A 04/29/2016   Procedure: CARDIOVERSION;  Surgeon: Pixie Casino, MD;  Location: [facility] ENDOSCOPY;  Service: Cardiovascular;  Laterality: N/A;  . CARPAL TUNNEL RELEASE    . CATARACT EXTRACTION    . CORONARY ARTERY BYPASS GRAFT     LIMA to LAD, SVG to OM, SVG to diagonal  . ELECTROPHYSIOLOGIC STUDY N/A 06/11/2016   Procedure: Atrial Fibrillation Ablation;  Surgeon: Thompson Grayer, MD;  Location: [place] CV LAB;  Service: Cardiovascular;  Laterality: N/A;  . left cleft palate and left cleft lip repair      Current Outpatient Prescriptions  Medication Sig Dispense Refill  . acetaminophen (TYLENOL) 500 MG tablet Take 1,000 mg by mouth See admin instructions. Take 2 tablets (1000 mg) by mouth daily at bedtime, may also take 2 tablets every 6 hours as needed for pain/fever    . albuterol (PROVENTIL HFA;VENTOLIN HFA) 108 (90 BASE) MCG/ACT inhaler Inhale 2 puffs into the lungs every 6 (six) hours as needed for wheezing or shortness of breath. Must keep June 2016  appt. 1 Inhaler 0  . albuterol (PROVENTIL) (2.5 MG/3ML) 0.083% nebulizer solution USE 1 VIAL VIA NEBULIZER 3 TIMES  A DAY AS DIRECTED 270 mL 3  . amiodarone (PACERONE) 200 MG tablet Take 200 mg by mouth daily.    [name] atorvastatin (LIPITOR) 80 MG tablet TAKE 1 TABLET (80 MG TOTAL) BY MOUTH DAILY. 90 tablet 2  . bisacodyl (DULCOLAX) 5 MG EC tablet Take 2 tablets (10 mg total) by mouth daily at 12 noon. 30 tablet 0  . carvedilol (COREG) 6.25 MG tablet TAKE 1 TABLET BY MOUTH TWICE A DAY 180 tablet 2  . ezetimibe (ZETIA) 10 MG tablet Take 10 mg by mouth daily.    . ferrous  sulfate 325 (65 FE) MG tablet Take 325 mg by mouth daily with breakfast.    . fluticasone (FLONASE) 50 MCG/ACT nasal spray PLACE 2 SPRAYS INTO BOTH NOSTRILS DAILY. 16 g 2  . furosemide (LASIX) 40 MG tablet Take 40 mg by mouth 2 (two) times daily.    . insulin aspart (NOVOLOG FLEXPEN) 100 UNIT/ML FlexPen Inject 6 Units into the skin 3 (three) times daily before meals.    . insulin degludec (TRESIBA FLEXTOUCH) 100 UNIT/ML SOPN FlexTouch Pen Inject 0.3 mLs (30 Units total) into the skin daily before lunch. 1 pen 0  . levothyroxine (SYNTHROID, LEVOTHROID) 100 MCG tablet Take 100 mcg by mouth daily before breakfast.     . Multiple Vitamin (MULTIVITAMIN WITH MINERALS) TABS tablet Take 1 tablet by mouth daily.    [name] omeprazole (PRILOSEC) 20 MG capsule Take 20 mg by mouth daily.      [name] PRESCRIPTION MEDICATION Inhale into the lungs at bedtime. CPAP    . senna (SENOKOT) 8.6 MG tablet Take 2 tablets by mouth at bedtime.     [name] SPIRIVA RESPIMAT 2.5 MCG/ACT AERS INHALE 2 PUFFS INTO THE LUNGS DAILY. 1 Inhaler 5  . spironolactone (ALDACTONE) 25 MG tablet TAKE 1/2 TABLET (12.5 MG TOTAL) BY MOUTH DAILY. 45 tablet 1  . traMADol (ULTRAM) 50 MG tablet Take 1 tablet (50 mg total) by mouth every 6 (six) hours as needed for moderate pain. 30 tablet 0  . vitamin B-12 (CYANOCOBALAMIN) 1000 MCG tablet Take 1,000 mcg by mouth daily.    [name] warfarin (COUMADIN) 5 MG tablet TAKE AS DIRECTED BY COUMADIN CLINIC (Patient taking differently: TAKE 1/2 TABLET BY MOUTH ON MONDAY NIGHT, TAKE 1 TABLET ON ALL OTHER NIGHTS OF THE WEEK OR  AS DIRECTED BY COUMADIN CLINIC) 40 tablet 3   No current facility-administered medications for this visit.     Allergies:   Aldactone [spironolactone]   Social History: Social History   Social History  . Marital status: Married    Spouse name: N/A  . Number of children: N/A  . Years of education: N/A   Occupational History  . retired     Engineer, agricultural   Social History Main Topics  . Smoking  status: Former Smoker    Packs/day: 3.00    Years: 65.00    Types: Cigarettes    Quit date: 10/25/1998  . Smokeless tobacco: Never Used  . Alcohol use No     Comment: remote history of heavy alcohol use  . Drug use: No  . Sexual activity: Not on file   Other Topics Concern  . Not on file   Social History Narrative   Lives [place]   Retired    Family History: Family History  Problem Relation Age of Onset  . Asthma Father   .  Stroke Father   . Hypertension Father   . Hypertension Mother   . Heart disease Brother   . Heart attack Brother   . Hypertension Sister   . Hypertension Brother     Review of Systems: All other systems reviewed and are otherwise negative except as noted above.   Physical Exam: VS:  BP 128/64   Pulse 80   Ht 5\' 8"  (1.727 m)   Wt 231 lb (104.8 kg)   BMI 35.12 kg/m  , BMI Body mass index is 35.12 kg/m.  GEN- The patient is obese appearing, alert and oriented x 3 today.   HEENT: normocephalic, atraumatic; sclera clear, conjunctiva pink; hearing intact; oropharynx clear; neck supple  Lungs- normal work of breathing, scattered expiratory wheezing Heart- Regular rate and rhythm (paced) GI- soft, non-tender, non-distended, bowel sounds present  Extremities- no clubbing, cyanosis, or edema; DP/PT/radial pulses 2+ bilaterally MS- no significant deformity or atrophy Skin- warm and dry, no rash or lesion; ICD pocket well healed Psych- euthymic mood, full affect Neuro- strength and sensation are intact  ICD interrogation- reviewed in detail today,  See PACEART report  EKG:  EKG is not ordered today.  Recent Labs: 04/21/2016: ALT 16; TSH 2.14 09/03/2016: B Natriuretic Peptide 552.6 09/05/2016: Hemoglobin 11.4; Platelets 216 09/06/2016: BUN 37; Creatinine, Ser 1.46; Potassium 4.2; Sodium 136   Wt Readings from Last 3 Encounters:  10/29/16 231 lb (104.8 kg)  09/30/16 233 lb (105.7 kg)  09/20/16 234 lb 3.2 oz (106.2 kg)     Other studies  Reviewed: Additional studies/ records that were reviewed today include: Dr Irish Lack and Dr Juanetta Gosling office notes  Assessment and Plan:  1.  Persistent atrial fibrillation Burden by device interrogation 0% Continue amiodarone 200mg  daily. Will need repeat TSH, LFT's at next follow up Continue Warfarin for CHADS2VASC of 8   2. Chronic systolic dysfunction Euvolemic on exam Stable on an appropriate medical regimen His RV lead is known to be fractured. Still with stable impedence, sensing, threshold RV tip to RV coil. Impedence bipolar >3000ohms.  Tachy therapies have been disabled. He is not device dependent. Will continue to follow for now.  No changes today  3.  OSA CPAP compliance encouraged  4.  CAD No recent ischemic symptoms  5.  HTN Stable No change required today  6.  Obesity Weight loss encouraged  7.  Wheezing Encouraged use of PRN albuterol over the next couple of days. If persistent symptoms, advised to call Dr Halford Chessman for further evaluation.   Current medicines are reviewed at length with the patient today.   The patient does not have concerns regarding his medicines.  The following changes were made today:  none  Labs/ tests ordered today include: none   Disposition:   Follow up with [facility], Dr Rayann Heman 6 months, Dr Irish Lack as scheduled   Signed, Chanetta Marshall, NP  10/29/2016 9:14 AM  [facility] [address] [place]  [place] [phone] (office) [phone] (fax)

## 2016-10-29 ENCOUNTER — Ambulatory Visit (INDEPENDENT_AMBULATORY_CARE_PROVIDER_SITE_OTHER): Payer: Medicare Other | Admitting: Nurse Practitioner

## 2016-10-29 VITALS — BP 128/64 | HR 80 | Ht 68.0 in | Wt 231.0 lb

## 2016-10-29 DIAGNOSIS — I10 Essential (primary) hypertension: Secondary | ICD-10-CM | POA: Diagnosis not present

## 2016-10-29 DIAGNOSIS — Z9989 Dependence on other enabling machines and devices: Secondary | ICD-10-CM

## 2016-10-29 DIAGNOSIS — I4819 Other persistent atrial fibrillation: Secondary | ICD-10-CM

## 2016-10-29 DIAGNOSIS — T82110A Breakdown (mechanical) of cardiac electrode, initial encounter: Secondary | ICD-10-CM

## 2016-10-29 DIAGNOSIS — I5022 Chronic systolic (congestive) heart failure: Secondary | ICD-10-CM

## 2016-10-29 DIAGNOSIS — G4733 Obstructive sleep apnea (adult) (pediatric): Secondary | ICD-10-CM

## 2016-10-29 DIAGNOSIS — I481 Persistent atrial fibrillation: Secondary | ICD-10-CM

## 2016-10-29 LAB — CUP PACEART INCLINIC DEVICE CHECK
Date Time Interrogation Session: 20180105092709
Implantable Lead Implant Date: 20091215
Implantable Lead Implant Date: 20091215
Implantable Lead Implant Date: 20091215
Implantable Lead Location: 753858
Implantable Lead Location: 753859
Implantable Lead Location: 753860
Implantable Lead Model: 4196
Implantable Lead Model: 5076
Implantable Lead Model: 6947
Implantable Pulse Generator Implant Date: 20150113

## 2016-10-29 NOTE — Patient Instructions (Signed)
Medication Instructions:   Your physician recommends that you continue on your current medications as directed. Please refer to the Current Medication list given to you today.    If you need a refill on your cardiac medications before your next appointment, please call your pharmacy.  Labwork: NONE ORDERED  TODAY    Testing/Procedures: NONE ORDERED  TODAY    Follow-Up: Your physician wants you to follow-up in:  IN 6   MONTHS WITH DR ALLRED  You will receive a reminder letter in the mail two months in advance. If you don't receive a letter, please call our office to schedule the follow-up appointment.   Remote monitoring is used to monitor your Pacemaker of ICD from home. This monitoring reduces the number of office visits required to check your device to one time per year. It allows us to keep an eye on the functioning of your device to ensure it is working properly. You are scheduled for a device check from home on .  01/28/2017.. You may send your transmission at any time that day. If you have a wireless device, the transmission will be sent automatically. After your physician reviews your transmission, you will receive a postcard with your next transmission date.     Any Other Special Instructions Will Be Listed Below (If Applicable).                                                                                                                                                   

## 2016-10-30 ENCOUNTER — Other Ambulatory Visit: Payer: Self-pay | Admitting: Cardiology

## 2016-11-16 ENCOUNTER — Ambulatory Visit (INDEPENDENT_AMBULATORY_CARE_PROVIDER_SITE_OTHER): Payer: Medicare Other | Admitting: Pulmonary Disease

## 2016-11-16 ENCOUNTER — Encounter: Payer: Self-pay | Admitting: Pulmonary Disease

## 2016-11-16 VITALS — BP 140/94 | HR 73 | Ht 66.0 in | Wt 226.0 lb

## 2016-11-16 DIAGNOSIS — J432 Centrilobular emphysema: Secondary | ICD-10-CM | POA: Diagnosis not present

## 2016-11-16 DIAGNOSIS — G4733 Obstructive sleep apnea (adult) (pediatric): Secondary | ICD-10-CM | POA: Diagnosis not present

## 2016-11-16 MED ORDER — ALBUTEROL SULFATE (2.5 MG/3ML) 0.083% IN NEBU: INHALATION_SOLUTION | RESPIRATORY_TRACT | 3 refills | Status: DC

## 2016-11-16 NOTE — Progress Notes (Signed)
Current Outpatient Prescriptions on File Prior to Visit  Medication Sig  . acetaminophen (TYLENOL) 500 MG tablet Take 1,000 mg by mouth See admin instructions. Take 2 tablets (1000 mg) by mouth daily at bedtime, may also take 2 tablets every 6 hours as needed for pain/fever  . albuterol (PROVENTIL HFA;VENTOLIN HFA) 108 (90 BASE) MCG/ACT inhaler Inhale 2 puffs into the lungs every 6 (six) hours as needed for wheezing or shortness of breath. Must keep June 2016 appt.  [name] amiodarone (PACERONE) 200 MG tablet Take 200 mg by mouth daily.  [name] atorvastatin (LIPITOR) 80 MG tablet TAKE 1 TABLET (80 MG TOTAL) BY MOUTH DAILY.  . bisacodyl (DULCOLAX) 5 MG EC tablet Take 2 tablets (10 mg total) by mouth daily at 12 noon.  . carvedilol (COREG) 6.25 MG tablet TAKE 1 TABLET BY MOUTH TWICE A DAY  . ezetimibe (ZETIA) 10 MG tablet Take 10 mg by mouth daily.  . ferrous sulfate 325 (65 FE) MG tablet Take 325 mg by mouth daily with breakfast.  . fluticasone (FLONASE) 50 MCG/ACT nasal spray PLACE 2 SPRAYS INTO BOTH NOSTRILS DAILY.  . furosemide (LASIX) 40 MG tablet Take 40 mg by mouth 2 (two) times daily.  . insulin aspart (NOVOLOG FLEXPEN) 100 UNIT/ML FlexPen Inject 6 Units into the skin 3 (three) times daily before meals.  . insulin degludec (TRESIBA FLEXTOUCH) 100 UNIT/ML SOPN FlexTouch Pen Inject 0.3 mLs (30 Units total) into the skin daily before lunch.  . levothyroxine (SYNTHROID, LEVOTHROID) 100 MCG tablet Take 100 mcg by mouth daily before breakfast.   . Multiple Vitamin (MULTIVITAMIN WITH MINERALS) TABS tablet Take 1 tablet by mouth daily.  [name] omeprazole (PRILOSEC) 20 MG capsule Take 20 mg by mouth daily.    [name] PRESCRIPTION MEDICATION Inhale into the lungs at bedtime. CPAP  . senna (SENOKOT) 8.6 MG tablet Take 2 tablets by mouth at bedtime.   [name] SPIRIVA RESPIMAT 2.5 MCG/ACT AERS INHALE 2 PUFFS INTO THE LUNGS DAILY.  [name] spironolactone (ALDACTONE) 25 MG tablet TAKE 1/2 TABLET (12.5 MG TOTAL) BY MOUTH DAILY.  .  traMADol (ULTRAM) 50 MG tablet Take 1 tablet (50 mg total) by mouth every 6 (six) hours as needed for moderate pain.  . vitamin B-12 (CYANOCOBALAMIN) 1000 MCG tablet Take 1,000 mcg by mouth daily.  [name] warfarin (COUMADIN) 5 MG tablet TAKE AS DIRECTED BY COUMADIN CLINIC (Patient taking differently: TAKE 1/2 TABLET BY MOUTH ON MONDAY NIGHT, TAKE 1 TABLET ON ALL OTHER NIGHTS OF THE WEEK OR  AS DIRECTED BY COUMADIN CLINIC)   No current facility-administered medications on file prior to visit.     Chief Complaint  Patient presents with  . Follow-up    Taking all meds as directed. Pt needs refills of neb meds. Wears CPAP nightly. Pt states that he has finally gotten used to the machine and seems to sleeping well. DME: [facility]    Sleep tests PSG 06/14/06>>AHI 35.5  Auto CPAP 05/19/16 to 06/17/16 >> used on 28 to 30 nights with average 6 hrs 57 min.  Average AHI 18.7 with median CPAP 8 and 95 th percentile CPAP 11 cm H2O. CPAP titration 06/23/16 >> CPAP 16 cm H2O >> AHI 6.7  Pulmonary tests PFT 03/30/11>>FEV1 1.98(72%), FEV1% 68, DLCO 64%  PFT 04/19/12>>FEV1 1.65 (67%), FEV1% 63, TLC 5.66 (99%), DLCO 71%, no BD. PFT 12/19/13 >> FEV1 2.03 (77%), FEV1% 72, TLC 6.45 (98%), DLCO 49% CT chest 01/18/14 >> mild centrilobular and paraseptal emphysema, mild fibrotic changes Rt periphery CT chest  01/15/16 >> emphysema  Cardiac tests Echo 01/11/16 >> EF 35 to 40%, mild LVH, grade 2 diastolic CHF, mod/severe LA dilation, PAS 23 mmHg  Past medical history CAD, systolic CHF, PAD, HTN, DM, HLD, A fib, Hypothyroidism, BPH, Psoriasis, GERD  Past surgical history, Family history, Social history, Allergies reviewed.  Vital signs BP (!) 140/94 (BP Location: Left Arm, Cuff Size: Normal)   Pulse 73   Ht 5\' 6"  (1.676 m)   Wt 226 lb (102.5 kg)   SpO2 96%   BMI 36.48 kg/m    History of Present Illness: [name] is a 83 y.o. male former smoker with COPD/Emphysema, and OSA.  He has been doing better with  CPAP.  He got a new mask and this fits better.  He is tolerating pressure setting.  He has occasional cough with clear sputum.  He has been using albuterol 4 times per day >> he though he needed to do this, but doesn't feel like it always helps.  He denies chest pain, leg swelling, sinus congestion, or skin rash.  Physical Exam:  General - pleasant ENT - no sinus tenderness, changes of cleft lip, no oral exudate Cardiac - regular, no murmur Chest - no wheeze Back - no tenderness Abd - soft, non tender Ext - no edema Neuro - normal strength Skin - no rashes Psych - normal mood   Assessment/Plan:  COPD with emphysema. - continue spiriva - advised he can use albuterol prn - he will call if he needs to change his albuterol inhaler based on insurance coverage - he can try mucinex prn  Obstructive sleep apnea. - he is compliant with CPAP and reports benefit - continue CPAP 16 cm H2O   Patient Instructions  Spiriva one puff daily  Use albuterol every 4 to 6 hours only as needed for coughing, wheeze, or chest congestion  Try mucinex 1200 mg twice per day as needed to loosen chest congestion  Follow up in 6 months   Chesley Mires, MD [place] Pager:  [phone] 11/16/2016, 10:27 AM

## 2016-11-16 NOTE — Patient Instructions (Signed)
Spiriva one puff daily  Use albuterol every 4 to 6 hours only as needed for coughing, wheeze, or chest congestion  Try mucinex 1200 mg twice per day as needed to loosen chest congestion  Follow up in 6 months

## 2016-11-18 ENCOUNTER — Telehealth: Payer: Self-pay | Admitting: Pulmonary Disease

## 2016-11-18 NOTE — Telephone Encounter (Signed)
Opened in error

## 2016-11-18 NOTE — Telephone Encounter (Signed)
Called [name] at [employer]. Pt is albuterol neb med will need a PA if filled under part D, will not be covered regardless if under part B. Called [facility] at [phone] pt's [phone]. Spoke with [place] and initiated PA, PA has been approved till 1/25/219. PA approval [id number].   Called [facility]. Informed tech of the approval. Nothing further needed at this time.

## 2016-11-18 NOTE — Telephone Encounter (Signed)
Can change to breo 100 one puff daily. 

## 2016-11-18 NOTE — Telephone Encounter (Addendum)
Spoke with wife, she states the [place] will not be covered anymore. The medications that will be covered for 2018:  Breo Advair Advair [facility] Symbicort  Dr. Halford Chessman, did you want to go ahead and change it now? They have enough of the [place] to last,  probably a months worth. Please advise.

## 2016-11-19 MED ORDER — FLUTICASONE FUROATE-VILANTEROL 100-25 MCG/INH IN AEPB: 1.0000 | INHALATION_SPRAY | Freq: Every day | RESPIRATORY_TRACT | 5 refills | Status: DC

## 2016-11-19 NOTE — Telephone Encounter (Signed)
Spoke with pt's wife, [name]. She is aware of the medication change. Rx has been sent in. Nothing further was needed at this time.

## 2016-11-22 ENCOUNTER — Ambulatory Visit (INDEPENDENT_AMBULATORY_CARE_PROVIDER_SITE_OTHER): Payer: Medicare Other

## 2016-11-22 DIAGNOSIS — I480 Paroxysmal atrial fibrillation: Secondary | ICD-10-CM

## 2016-11-22 LAB — POCT INR: INR: 2.3

## 2016-11-30 ENCOUNTER — Ambulatory Visit (INDEPENDENT_AMBULATORY_CARE_PROVIDER_SITE_OTHER): Payer: Medicare Other

## 2016-11-30 ENCOUNTER — Telehealth: Payer: Self-pay

## 2016-11-30 DIAGNOSIS — I5022 Chronic systolic (congestive) heart failure: Secondary | ICD-10-CM

## 2016-11-30 DIAGNOSIS — Z95810 Presence of automatic (implantable) cardiac defibrillator: Secondary | ICD-10-CM | POA: Diagnosis not present

## 2016-11-30 NOTE — Telephone Encounter (Signed)
Remote ICM transmission received.  Attempted patient call and left detailed message regarding transmission and next ICM scheduled for 12/31/2016.  Advised to return call for any fluid symptoms or questions.

## 2016-11-30 NOTE — Progress Notes (Signed)
EPIC Encounter for ICM Monitoring  Patient Name: [name] is a 83 y.o. male Date: 11/30/2016 Primary Care Physican: Irven Shelling, MD Primary [place] Electrophysiologist: Allred Dry Weight:unknown Bi-V Pacing: 99%      Attempted call to patient and unable to reach.  Left detailed message regarding transmission.  Transmission reviewed.   Thoracic impedance normal   Recommendations: Left voice mail with ICM number and encouraged to call for fluid symptoms.  Follow-up plan: ICM clinic phone appointment on 12/31/2016.  Copy of ICM check sent to physician.   3 month ICM trend: 11/30/2016   1 Year ICM trend:      Rosalene Billings, RN 11/30/2016 4:37 PM

## 2016-12-20 ENCOUNTER — Ambulatory Visit (INDEPENDENT_AMBULATORY_CARE_PROVIDER_SITE_OTHER): Payer: Medicare Other | Admitting: *Deleted

## 2016-12-20 DIAGNOSIS — I480 Paroxysmal atrial fibrillation: Secondary | ICD-10-CM | POA: Diagnosis not present

## 2016-12-20 LAB — POCT INR: INR: 1.6

## 2016-12-22 ENCOUNTER — Encounter: Payer: Medicare Other | Admitting: Internal Medicine

## 2016-12-26 ENCOUNTER — Other Ambulatory Visit: Payer: Self-pay | Admitting: Interventional Cardiology

## 2016-12-31 ENCOUNTER — Telehealth: Payer: Self-pay

## 2016-12-31 ENCOUNTER — Ambulatory Visit (INDEPENDENT_AMBULATORY_CARE_PROVIDER_SITE_OTHER): Payer: Medicare Other | Admitting: *Deleted

## 2016-12-31 DIAGNOSIS — Z95810 Presence of automatic (implantable) cardiac defibrillator: Secondary | ICD-10-CM

## 2016-12-31 DIAGNOSIS — I255 Ischemic cardiomyopathy: Secondary | ICD-10-CM

## 2016-12-31 DIAGNOSIS — I5022 Chronic systolic (congestive) heart failure: Secondary | ICD-10-CM | POA: Diagnosis not present

## 2016-12-31 DIAGNOSIS — I420 Dilated cardiomyopathy: Secondary | ICD-10-CM | POA: Diagnosis not present

## 2016-12-31 LAB — CUP PACEART REMOTE DEVICE CHECK
Battery Remaining Longevity: 31 mo
Battery Voltage: 2.95 V
Brady Statistic AP VP Percent: 97.82 %
Brady Statistic AP VS Percent: 0.18 %
Brady Statistic AS VP Percent: 1.98 %
Brady Statistic AS VS Percent: 0.02 %
Brady Statistic RA Percent Paced: 97.81 %
Brady Statistic RV Percent Paced: 99.04 %
Date Time Interrogation Session: 20180309083424
HighPow Impedance: 55 Ohm
HighPow Impedance: 65 Ohm
Implantable Lead Implant Date: 20091215
Implantable Lead Implant Date: 20091215
Implantable Lead Implant Date: 20091215
Implantable Lead Location: 753858
Implantable Lead Location: 753859
Implantable Lead Location: 753860
Implantable Lead Model: 4196
Implantable Lead Model: 5076
Implantable Lead Model: 6947
Implantable Pulse Generator Implant Date: 20150113
Lead Channel Impedance Value: 4047 Ohm
Lead Channel Impedance Value: 551 Ohm
Lead Channel Impedance Value: 551 Ohm
Lead Channel Impedance Value: 551 Ohm
Lead Channel Impedance Value: 589 Ohm
Lead Channel Impedance Value: 988 Ohm
Lead Channel Pacing Threshold Amplitude: 0.75 V
Lead Channel Pacing Threshold Amplitude: 0.75 V
Lead Channel Pacing Threshold Amplitude: 1.375 V
Lead Channel Pacing Threshold Pulse Width: 0.4 ms
Lead Channel Pacing Threshold Pulse Width: 0.4 ms
Lead Channel Pacing Threshold Pulse Width: 0.6 ms
Lead Channel Sensing Intrinsic Amplitude: 1.375 mV
Lead Channel Sensing Intrinsic Amplitude: 1.375 mV
Lead Channel Sensing Intrinsic Amplitude: 11.875 mV
Lead Channel Sensing Intrinsic Amplitude: 11.875 mV
Lead Channel Setting Pacing Amplitude: 1.5 V
Lead Channel Setting Pacing Amplitude: 1.75 V
Lead Channel Setting Pacing Amplitude: 2.5 V
Lead Channel Setting Pacing Pulse Width: 0.6 ms
Lead Channel Setting Pacing Pulse Width: 1 ms
Lead Channel Setting Sensing Sensitivity: 0.3 mV

## 2016-12-31 NOTE — Progress Notes (Signed)
Wife returned call. Transmission reviewed.  She stated he is feeling good and denied any fluid symptoms.  No changes today and next ICM Remote transmission is 02/01/2017

## 2016-12-31 NOTE — Telephone Encounter (Signed)
Remote ICM transmission received.  Attempted patient call and left message to return call.   

## 2016-12-31 NOTE — Progress Notes (Signed)
EPIC Encounter for ICM Monitoring  Patient Name: [name] is a 83 y.o. male Date: 12/31/2016 Primary Care Physican: Irven Shelling, MD Primary [place] Electrophysiologist: Allred Dry Weight:unknown Bi-V Pacing:  99.5%       Attempted call to patient and unable to reach.  Left message to return call.  Transmission reviewed.    Thoracic impedance normal.  Prescribed dosage: Furosemide 40 mg 1 tablet twice a day  Labs: 09/06/2016 Creatinine 1.46, BUN 37, Potassium 4.2, Sodium 136, EGFR 44-51 09/05/2016 Creatinine 1.33, BUN 30, Potassium 4.2, Sodium 137, EGFR 49-57  09/04/2016 Creatinine 1.27, BUN 21, Potassium 4.1, Sodium 136, EGFR 52-60  09/03/2016 Creatinine 1.48, BUN 21, Potassium 3.9, Sodium 135, EGFR 43-50  07/02/2016 Creatinine 1.69, BUN 23, Potassium 4.6, Sodium 140  06/23/2016 Creatinine 1.68, BUN 26, Potassium 4.9, Sodium 138  06/02/2016 Creatinine 1.28, BUN 27, Potassium 5.3, Sodium 139   Recommendations: NONE - Unable to reach patient   Follow-up plan: ICM clinic phone appointment on 02/01/2017.  Copy of ICM check sent to device physician.   3 month ICM trend: 12/31/2016     1 Year ICM trend:      Rosalene Billings, RN 12/31/2016 1:07 PM

## 2016-12-31 NOTE — Progress Notes (Signed)
Remote ICD transmission.   

## 2017-01-05 ENCOUNTER — Encounter: Payer: Self-pay | Admitting: Cardiology

## 2017-01-10 ENCOUNTER — Ambulatory Visit (INDEPENDENT_AMBULATORY_CARE_PROVIDER_SITE_OTHER): Payer: Medicare Other | Admitting: *Deleted

## 2017-01-10 DIAGNOSIS — I480 Paroxysmal atrial fibrillation: Secondary | ICD-10-CM | POA: Diagnosis not present

## 2017-01-10 LAB — POCT INR: INR: 2.6

## 2017-01-25 ENCOUNTER — Other Ambulatory Visit: Payer: Self-pay | Admitting: Nurse Practitioner

## 2017-01-25 ENCOUNTER — Other Ambulatory Visit: Payer: Self-pay | Admitting: *Deleted

## 2017-01-25 NOTE — Telephone Encounter (Signed)
PT WIFE CLARIFIED THAT PT IS CURRENTLY TAKING  200 MG (HALF OF 400 MG TABLET ) AMIODARONE DUE TO CHANGES DR Rayann Heman MADE DUE TO A HOME VISIT.

## 2017-02-01 ENCOUNTER — Ambulatory Visit (INDEPENDENT_AMBULATORY_CARE_PROVIDER_SITE_OTHER): Payer: Medicare Other

## 2017-02-01 DIAGNOSIS — I5022 Chronic systolic (congestive) heart failure: Secondary | ICD-10-CM

## 2017-02-01 DIAGNOSIS — Z95810 Presence of automatic (implantable) cardiac defibrillator: Secondary | ICD-10-CM

## 2017-02-01 NOTE — Progress Notes (Signed)
EPIC Encounter for ICM Monitoring  Patient Name: [name] is a 83 y.o. male Date: 02/01/2017 Primary Care Physican: GRIFFIN,JOHN JOSEPH, MD Primary Cardiologist:[place] Electrophysiologist: Allred Dry Weight:unknown Bi-V Pacing:  99.6%         Spoke with wife. Heart Failure questions reviewed, pt asymptomatic    Thoracic impedance normal, close to baseline.   Prescribed dosage: Furosemide 40 mg 1 tablet twice a day  Labs: 09/06/2016 Creatinine 1.46, BUN 37, Potassium 4.2, Sodium 136, EGFR 44-51 09/05/2016 Creatinine 1.33, BUN 30, Potassium 4.2, Sodium 137, EGFR 49-57  09/04/2016 Creatinine 1.27, BUN 21, Potassium 4.1, Sodium 136, EGFR 52-60  09/03/2016 Creatinine 1.48, BUN 21, Potassium 3.9, Sodium 135, EGFR 43-50  07/02/2016 Creatinine 1.69, BUN 23, Potassium 4.6, Sodium 140  06/23/2016 Creatinine 1.68, BUN 26, Potassium 4.9, Sodium 138  06/02/2016 Creatinine 1.28, BUN 27, Potassium 5.3, Sodium 139   Recommendations: No changes. Discussed to limit salt intake to 2000 mg/day and fluid intake to < 2 liters/day.  Encouraged to call for fluid symptoms.  Follow-up plan: ICM clinic phone appointment on 03/04/2017.    Copy of ICM check sent to device physician.   3 month ICM trend: 02/01/2017   1 Year ICM trend:      [name], RN 02/01/2017 10:04 AM    

## 2017-02-07 ENCOUNTER — Ambulatory Visit (INDEPENDENT_AMBULATORY_CARE_PROVIDER_SITE_OTHER): Payer: Medicare Other | Admitting: *Deleted

## 2017-02-07 DIAGNOSIS — I480 Paroxysmal atrial fibrillation: Secondary | ICD-10-CM

## 2017-02-07 DIAGNOSIS — Z7901 Long term (current) use of anticoagulants: Secondary | ICD-10-CM | POA: Diagnosis not present

## 2017-02-07 LAB — POCT INR: INR: 2

## 2017-02-11 NOTE — Progress Notes (Signed)
ICM remote transmission rescheduled from 03/04/2017 to 03/07/2017.  

## 2017-03-04 ENCOUNTER — Other Ambulatory Visit: Payer: Self-pay | Admitting: Internal Medicine

## 2017-03-04 ENCOUNTER — Ambulatory Visit
Admission: RE | Admit: 2017-03-04 | Discharge: 2017-03-04 | Disposition: A | Discharge status: Home / Self Care | Payer: Medicare Other | Source: Ambulatory Visit | Attending: Internal Medicine | Admitting: Internal Medicine

## 2017-03-04 DIAGNOSIS — R0789 Other chest pain: Secondary | ICD-10-CM

## 2017-03-07 ENCOUNTER — Telehealth: Payer: Self-pay

## 2017-03-07 ENCOUNTER — Ambulatory Visit (INDEPENDENT_AMBULATORY_CARE_PROVIDER_SITE_OTHER): Payer: Medicare Other

## 2017-03-07 DIAGNOSIS — I5022 Chronic systolic (congestive) heart failure: Secondary | ICD-10-CM | POA: Diagnosis not present

## 2017-03-07 DIAGNOSIS — I480 Paroxysmal atrial fibrillation: Secondary | ICD-10-CM | POA: Diagnosis not present

## 2017-03-07 DIAGNOSIS — Z95810 Presence of automatic (implantable) cardiac defibrillator: Secondary | ICD-10-CM

## 2017-03-07 DIAGNOSIS — Z7901 Long term (current) use of anticoagulants: Secondary | ICD-10-CM

## 2017-03-07 LAB — POCT INR: INR: 3.9

## 2017-03-07 NOTE — Progress Notes (Signed)
EPIC Encounter for ICM Monitoring  Patient Name: [name] is a 83 y.o. male Date: 03/07/2017 Primary Care Physican: Griffin, John, MD Primary Cardiologist:Varanasi Electrophysiologist: Allred Dry Weight:210 lbs Bi-V Pacing: 97.2%   Clinical Status (01-Feb-2017 to 07-Mar-2017) Treated VT/VF 0 episodes AT/AF 2 episodes  Time in AT/AF 0.8 hr/day (3.4%)  Observations (1) (01-Feb-2017 to 07-Mar-2017)  AT/AF >= 6 hr for 1 days.       Spoke with wife and husband.  HF questions reviewed and he is asymptomatic.     Thoracic impedance abnormal suggesting fluid accumulation since 02/22/2017.  Prescribed dosage: Furosemide 40 mg 1 tablet twice a day  Labs: 11/13/2017Creatinine 1.46, BUN 37, Potassium 4.2, Sodium 136, EGFR 44-51 11/12/2017Creatinine 1.33, BUN 30, Potassium 4.2, Sodium 137, EGFR 49-57  11/11/2017Creatinine 1.27, BUN 21, Potassium 4.1, Sodium 136, EGFR 52-60  11/10/2017Creatinine 1.48, BUN 21, Potassium 3.9, Sodium 135, EGFR 43-50  07/02/2016 Creatinine 1.69, BUN 23, Potassium 4.6, Sodium 140  06/23/2016 Creatinine 1.68, BUN 26, Potassium 4.9, Sodium 138  06/02/2016 Creatinine 1.28, BUN 27, Potassium 5.3, Sodium 139   Recommendations: Patient reported eating more salt than usual and advised eating too much salt can cause fluid retention.  Discussed to limit salt intake and he agreed.    Follow-up plan: ICM clinic phone appointment on 03/17/2017 to recheck fluid levels.  Office appointment scheduled on 04/22/2017 with Dr Varanasi.  Copy of ICM check sent to Dr Varanasi and Dr Allred for review and if any recommendations will call him back.   3 month ICM trend: 03/07/2017   1 Year ICM trend:      [name], RN 03/07/2017 10:58 AM    

## 2017-03-07 NOTE — Telephone Encounter (Signed)
Remote ICM transmission received.  Attempted patient call and left message to return call.   

## 2017-03-13 ENCOUNTER — Other Ambulatory Visit: Payer: Self-pay | Admitting: Pulmonary Disease

## 2017-03-17 ENCOUNTER — Ambulatory Visit (INDEPENDENT_AMBULATORY_CARE_PROVIDER_SITE_OTHER): Payer: Self-pay

## 2017-03-17 DIAGNOSIS — Z95810 Presence of automatic (implantable) cardiac defibrillator: Secondary | ICD-10-CM

## 2017-03-17 DIAGNOSIS — I5022 Chronic systolic (congestive) heart failure: Secondary | ICD-10-CM

## 2017-03-17 NOTE — Progress Notes (Signed)
EPIC Encounter for ICM Monitoring  Patient Name: [name] is a 83 y.o. male Date: 03/17/2017 Primary Care Physican: [name], MD Primary [place] Electrophysiologist: Allred Dry Weight:211 lbs Bi-V Pacing: 93.4%   Clinical Status (07-Mar-2017 to 17-Mar-2017) Treated VT/VF 0 episodes V. Pacing 93.4%  AT/AF 1 episode Atrial Pacing 89.0% Upper Rate 130 bpm Time in AT/AF 2.0 hr/day (8.5%)  Observations (1) (07-Mar-2017 to 17-Mar-2017)  AT/AF >= 6 hr for 1 days.     Spoke with wife.  Heart Failure questions reviewed, pt asymptomatic.   Thoracic impedance continues to be abnormal suggesting fluid accumulation but is trending back toward baseline.  Prescribed dosage: Furosemide 40 mg 1 tablet twice a day  Labs: 11/13/2017Creatinine 1.46, BUN 37, Potassium 4.2, Sodium 136, EGFR 44-51 11/12/2017Creatinine 1.33, BUN 30, Potassium 4.2, Sodium 137, EGFR 49-57  11/11/2017Creatinine 1.27, BUN 21, Potassium 4.1, Sodium 136, EGFR 52-60  11/10/2017Creatinine 1.48, BUN 21, Potassium 3.9, Sodium 135, EGFR 43-50  07/02/2016 Creatinine 1.69, BUN 23, Potassium 4.6, Sodium 140  06/23/2016 Creatinine 1.68, BUN 26, Potassium 4.9, Sodium 138  06/02/2016 Creatinine 1.28, BUN 27, Potassium 5.3, Sodium 139   Recommendations: Wife stated he has cut back on salt intake.    Encouraged to call for fluid symptoms or use local ER for any urgent symptoms.  Follow-up plan: ICM clinic phone appointment on 03/24/2017 to recheck fluid levels.  Office appointment scheduled on 04/21/2017 with Dr Irish Lack and 05/02/2017 with Dr Rayann Heman.  Copy of ICM check sent to Dr Irish Lack and Dr Rayann Heman for review and if any recommendations will call her back.   3 month ICM trend: 03/17/2017   1 Year ICM trend:      Rosalene Billings, RN 03/17/2017 1:55 PM

## 2017-03-22 ENCOUNTER — Ambulatory Visit (INDEPENDENT_AMBULATORY_CARE_PROVIDER_SITE_OTHER): Payer: Medicare Other | Admitting: *Deleted

## 2017-03-22 DIAGNOSIS — Z7901 Long term (current) use of anticoagulants: Secondary | ICD-10-CM | POA: Diagnosis not present

## 2017-03-22 DIAGNOSIS — I480 Paroxysmal atrial fibrillation: Secondary | ICD-10-CM | POA: Diagnosis not present

## 2017-03-22 LAB — POCT INR: INR: 2.3

## 2017-03-24 ENCOUNTER — Ambulatory Visit (INDEPENDENT_AMBULATORY_CARE_PROVIDER_SITE_OTHER): Payer: Medicare Other

## 2017-03-24 DIAGNOSIS — I5022 Chronic systolic (congestive) heart failure: Secondary | ICD-10-CM

## 2017-03-24 DIAGNOSIS — Z95810 Presence of automatic (implantable) cardiac defibrillator: Secondary | ICD-10-CM

## 2017-03-25 NOTE — Progress Notes (Signed)
EPIC Encounter for ICM Monitoring  Patient Name: [name] is a 83 y.o. male Date: 03/25/2017 Primary Care Physican: [name], MD Primary [place] Electrophysiologist: Allred Dry PETKKO:469 lbs Bi-V Pacing: 99.5%       Spoke with wife. Heart Failure questions reviewed, pt symptomatic with swelling of feet and 3 lb weight gain.   Thoracic impedance did return to normal after 5/24 transmission but has started to trend below baseline again on 03/20/2016 suggesting fluid accumulation.  Prescribed dosage: Furosemide 40 mg 1 tablet twice a day  Labs: 11/13/2017Creatinine 1.46, BUN 37, Potassium 4.2, Sodium 136, EGFR 44-51 11/12/2017Creatinine 1.33, BUN 30, Potassium 4.2, Sodium 137, EGFR 49-57  11/11/2017Creatinine 1.27, BUN 21, Potassium 4.1, Sodium 136, EGFR 52-60  11/10/2017Creatinine 1.48, BUN 21, Potassium 3.9, Sodium 135, EGFR 43-50  07/02/2016 Creatinine 1.69, BUN 23, Potassium 4.6, Sodium 140  06/23/2016 Creatinine 1.68, BUN 26, Potassium 4.9, Sodium 138  06/02/2016 Creatinine 1.28, BUN 27, Potassium 5.3, Sodium 139   Recommendations: Advise will review transmission with either physician or PA regarding symptoms and transmission.  Follow-up plan: ICM clinic phone appointment on 03/31/2017.  Office appointment scheduled on 04/21/2017 with Dr Irish Lack and 05/02/2017 with Dr Rayann Heman.  Copy of ICM check sent to primary cardiologist and device physician.   3 month ICM trend: 03/24/2017   1 Year ICM trend:      Rosalene Billings, RN 03/25/2017 10:33 AM

## 2017-03-25 NOTE — Progress Notes (Signed)
Reviewed transmission and symptoms with Ignacia Bayley, PA in the office.  Call to wife and reported Dr Rayann Heman or Dr Irish Lack were not in the office today and reviewed transmission with PA, Ignacia Bayley.  Chris recommended patient take Furosemide 40mg  2 tablets (80 mg total) twice a day x 2 days only and after 2nd day return to prescribed dosage 40 mg 1 tablet twice a day.  She verbalized understanding.  Recheck fluid levels on 03/31/2017.

## 2017-03-31 ENCOUNTER — Telehealth: Payer: Self-pay

## 2017-03-31 ENCOUNTER — Ambulatory Visit (INDEPENDENT_AMBULATORY_CARE_PROVIDER_SITE_OTHER): Payer: Self-pay

## 2017-03-31 DIAGNOSIS — Z95810 Presence of automatic (implantable) cardiac defibrillator: Secondary | ICD-10-CM

## 2017-03-31 DIAGNOSIS — I5022 Chronic systolic (congestive) heart failure: Secondary | ICD-10-CM

## 2017-03-31 NOTE — Telephone Encounter (Signed)
Remote ICM transmission received.  Attempted patient call and no answer or answering machine.  

## 2017-03-31 NOTE — Progress Notes (Signed)
EPIC Encounter for ICM Monitoring  Patient Name: [name] is a 83 y.o. male Date: 03/31/2017 Primary Care Physican: [name], MD Primary [place] Electrophysiologist: Allred Dry Weight:Last known ICM weight 214lbs Bi-V Pacing: 99.4%                                       Attempted call to patient and unable to reach. Transmission reviewed.    Thoracic impedance returned to normal after Furosemide increased x 2 days on 03/24/2017 but starting to trend below baseline again.  Prescribed dosage: Furosemide 40 mg 1 tablet twice a day  Labs: 11/13/2017Creatinine 1.46, BUN 37, Potassium 4.2, Sodium 136, EGFR 44-51 11/12/2017Creatinine 1.33, BUN 30, Potassium 4.2, Sodium 137, EGFR 49-57  11/11/2017Creatinine 1.27, BUN 21, Potassium 4.1, Sodium 136, EGFR 52-60  11/10/2017Creatinine 1.48, BUN 21, Potassium 3.9, Sodium 135, EGFR 43-50  07/02/2016 Creatinine 1.69, BUN 23, Potassium 4.6, Sodium 140  06/23/2016 Creatinine 1.68, BUN 26, Potassium 4.9, Sodium 138  06/02/2016 Creatinine 1.28, BUN 27, Potassium 5.3, Sodium 139   Recommendations:  NONE - Unable to reach patient   Follow-up plan: ICM clinic phone appointment on 04/19/2017.  Office appointment scheduled on 04/21/2017 with Dr Irish Lack and 05/02/2017 with Dr Rayann Heman.  Copy of ICM check sent to Dr Irish Lack and Dr Rayann Heman.  3 month ICM trend: 03/31/2017     1 Year ICM trend:      Rosalene Billings, RN 03/31/2017 10:29 AM

## 2017-04-01 ENCOUNTER — Other Ambulatory Visit: Payer: Self-pay | Admitting: Nurse Practitioner

## 2017-04-01 ENCOUNTER — Ambulatory Visit
Admission: RE | Admit: 2017-04-01 | Discharge: 2017-04-01 | Disposition: A | Discharge status: Home / Self Care | Payer: Medicare Other | Source: Ambulatory Visit | Attending: Nurse Practitioner | Admitting: Nurse Practitioner

## 2017-04-01 DIAGNOSIS — M898X1 Other specified disorders of bone, shoulder: Secondary | ICD-10-CM

## 2017-04-01 DIAGNOSIS — M542 Cervicalgia: Secondary | ICD-10-CM

## 2017-04-01 DIAGNOSIS — H539 Unspecified visual disturbance: Secondary | ICD-10-CM

## 2017-04-01 DIAGNOSIS — M25511 Pain in right shoulder: Secondary | ICD-10-CM

## 2017-04-01 DIAGNOSIS — R0781 Pleurodynia: Secondary | ICD-10-CM

## 2017-04-04 ENCOUNTER — Encounter: Payer: Self-pay | Admitting: Interventional Cardiology

## 2017-04-07 ENCOUNTER — Ambulatory Visit (INDEPENDENT_AMBULATORY_CARE_PROVIDER_SITE_OTHER): Payer: Medicare Other | Admitting: *Deleted

## 2017-04-07 DIAGNOSIS — I420 Dilated cardiomyopathy: Secondary | ICD-10-CM | POA: Diagnosis not present

## 2017-04-07 DIAGNOSIS — I255 Ischemic cardiomyopathy: Secondary | ICD-10-CM

## 2017-04-07 LAB — CUP PACEART REMOTE DEVICE CHECK
Battery Remaining Longevity: 28 mo
Battery Voltage: 2.95 V
Brady Statistic AP VP Percent: 98.07 %
Brady Statistic AP VS Percent: 0.09 %
Brady Statistic AS VP Percent: 1.84 %
Brady Statistic AS VS Percent: 0.01 %
Brady Statistic RA Percent Paced: 98 %
Brady Statistic RV Percent Paced: 99.61 %
Date Time Interrogation Session: 20180614052303
HighPow Impedance: 51 Ohm
HighPow Impedance: 68 Ohm
Implantable Lead Implant Date: 20091215
Implantable Lead Implant Date: 20091215
Implantable Lead Implant Date: 20091215
Implantable Lead Location: 753858
Implantable Lead Location: 753859
Implantable Lead Location: 753860
Implantable Lead Model: 4196
Implantable Lead Model: 5076
Implantable Lead Model: 6947
Implantable Pulse Generator Implant Date: 20150113
Lead Channel Impedance Value: 4047 Ohm
Lead Channel Impedance Value: 513 Ohm
Lead Channel Impedance Value: 532 Ohm
Lead Channel Impedance Value: 532 Ohm
Lead Channel Impedance Value: 551 Ohm
Lead Channel Impedance Value: 950 Ohm
Lead Channel Pacing Threshold Amplitude: 0.75 V
Lead Channel Pacing Threshold Amplitude: 0.875 V
Lead Channel Pacing Threshold Amplitude: 1.375 V
Lead Channel Pacing Threshold Pulse Width: 0.4 ms
Lead Channel Pacing Threshold Pulse Width: 0.4 ms
Lead Channel Pacing Threshold Pulse Width: 0.6 ms
Lead Channel Sensing Intrinsic Amplitude: 1 mV
Lead Channel Sensing Intrinsic Amplitude: 1 mV
Lead Channel Sensing Intrinsic Amplitude: 10.875 mV
Lead Channel Sensing Intrinsic Amplitude: 10.875 mV
Lead Channel Setting Pacing Amplitude: 1.5 V
Lead Channel Setting Pacing Amplitude: 2 V
Lead Channel Setting Pacing Amplitude: 2.5 V
Lead Channel Setting Pacing Pulse Width: 0.6 ms
Lead Channel Setting Pacing Pulse Width: 1 ms
Lead Channel Setting Sensing Sensitivity: 0.3 mV

## 2017-04-07 NOTE — Progress Notes (Signed)
Remote ICD transmission.   

## 2017-04-08 ENCOUNTER — Encounter: Payer: Self-pay | Admitting: Cardiology

## 2017-04-11 ENCOUNTER — Other Ambulatory Visit: Payer: Self-pay | Admitting: Pulmonary Disease

## 2017-04-19 ENCOUNTER — Ambulatory Visit (INDEPENDENT_AMBULATORY_CARE_PROVIDER_SITE_OTHER): Payer: Medicare Other

## 2017-04-19 DIAGNOSIS — I5022 Chronic systolic (congestive) heart failure: Secondary | ICD-10-CM | POA: Diagnosis not present

## 2017-04-19 DIAGNOSIS — Z95810 Presence of automatic (implantable) cardiac defibrillator: Secondary | ICD-10-CM

## 2017-04-19 NOTE — Progress Notes (Signed)
EPIC Encounter for ICM Monitoring  Patient Name: [name] is a 83 y.o. male Date: 04/19/2017 Primary Care Physican: [name], MD Primary [place] Electrophysiologist: Allred Dry Weight:Last known ICM weight 214lbs Bi-V Pacing: 97.7%  Clinical Status (07-Apr-2017 to 19-Apr-2017) Time in AT/AF 0.7 hr/day (2.8%)  Longest AT/AF 8 hours         Spoke with wife.  Heart Failure questions reviewed, pt asymptomatic.  He fell and broke a couple of ribs about 3 weeks.   Thoracic impedance close to normal.  Prescribed dosage: Furosemide 40 mg 1 tablet twice a day  Labs: 11/13/2017Creatinine 1.46, BUN 37, Potassium 4.2, Sodium 136, EGFR 44-51 11/12/2017Creatinine 1.33, BUN 30, Potassium 4.2, Sodium 137, EGFR 49-57  11/11/2017Creatinine 1.27, BUN 21, Potassium 4.1, Sodium 136, EGFR 52-60  11/10/2017Creatinine 1.48, BUN 21, Potassium 3.9, Sodium 135, EGFR 43-50  07/02/2016 Creatinine 1.69, BUN 23, Potassium 4.6, Sodium 140  06/23/2016 Creatinine 1.68, BUN 26, Potassium 4.9, Sodium 138  06/02/2016 Creatinine 1.28, BUN 27, Potassium 5.3, Sodium 139   Recommendations: No changes.   Encouraged to call for fluid symptoms.  Follow-up plan: ICM clinic phone appointment on 06/02/2017.  Office appointment scheduled on 04/21/2017 with Dr Irish Lack and 05/02/2017 with Dr Rayann Heman.  Copy of ICM check sent to primary cardiologist and device physician.   3 month ICM trend: 04/19/2017    AT/AF   1 Year ICM trend:      Rosalene Billings, RN 04/19/2017 11:46 AM

## 2017-04-21 ENCOUNTER — Ambulatory Visit (INDEPENDENT_AMBULATORY_CARE_PROVIDER_SITE_OTHER): Payer: Medicare Other | Admitting: *Deleted

## 2017-04-21 ENCOUNTER — Ambulatory Visit (INDEPENDENT_AMBULATORY_CARE_PROVIDER_SITE_OTHER): Payer: Medicare Other | Admitting: Interventional Cardiology

## 2017-04-21 ENCOUNTER — Encounter: Payer: Self-pay | Admitting: Interventional Cardiology

## 2017-04-21 VITALS — BP 122/62 | HR 74 | Ht 66.0 in | Wt 215.1 lb

## 2017-04-21 DIAGNOSIS — I739 Peripheral vascular disease, unspecified: Secondary | ICD-10-CM

## 2017-04-21 DIAGNOSIS — Z7901 Long term (current) use of anticoagulants: Secondary | ICD-10-CM | POA: Diagnosis not present

## 2017-04-21 DIAGNOSIS — I480 Paroxysmal atrial fibrillation: Secondary | ICD-10-CM | POA: Diagnosis not present

## 2017-04-21 DIAGNOSIS — I5022 Chronic systolic (congestive) heart failure: Secondary | ICD-10-CM

## 2017-04-21 DIAGNOSIS — I25119 Atherosclerotic heart disease of native coronary artery with unspecified angina pectoris: Secondary | ICD-10-CM

## 2017-04-21 LAB — POCT INR: INR: 3.7

## 2017-04-21 NOTE — Patient Instructions (Signed)

## 2017-04-21 NOTE — Progress Notes (Signed)
Cardiology Office Note   Date:  04/21/2017   ID:  [name], DOB 09-06-1934, MRN [id number]  PCP:  [name], MD    No chief complaint on file. CAD   Wt Readings from Last 3 Encounters:  04/21/17 215 lb 1.9 oz (97.6 kg)  11/16/16 226 lb (102.5 kg)  10/29/16 231 lb (104.8 kg)       History of Present Illness: [name] is a 83 y.o. male  Who has a history of COPD on CPAP, left subclavian stenosis, CHF ( systolic, EF 75% in 1025), Afib s/p biventricular defibrillator, CAD s/p CABG, hypothyroidism, HTN, CVA.  Admitted 01/16/16 with acute resp. Failure with hypoxia acute COPD exacerbation RLL PNA, and acute systolic HF.Chronic systolic CHF: EF 85-27%  Paroxsymal Afib At discharge in 3/17: - Currently in AV paced rhythm on amiodarone. - CHADSVASC of 8, A/C with warfarin, theraputic AoCKD3- improving - ARB held,  -Resumed home oral lasix 40mg  BID  He later failed attempts at restoring NSR with Dr. Rayann Heman.  Ablation was done- see below.      He has had bowel obstruction.  He had some type of colon cleanse with relief.  He is back to eating regular foods.  He had a BM this AM.    He lost 15 lbs in 2017 through portion control.   He had an AFib ablation and flutter ablation in 8/17.  Since the last visit, he had a fall after tripping on a ramp.  He broke three ribs.   Denies : Chest pain. Dizziness. Leg edema. Nitroglycerin use.  Palpitations. Paroxysmal nocturnal dyspnea. Shortness of breath. Syncope.   Chronic DOE.       Past Medical History:  Diagnosis Date  . Atrial fibrillation ([place])    persistent, previously seen at [facility] and placed on amiodarone  . Benign prostatic hypertrophy   . CAD (coronary artery disease)    multivessel s/p inferolateral wall MI with subsequent CABG 11/1998.  Cath 2009 with Patent grafts  . Cleft palate   . COPD with emphysema ([place]) 04/01/2010  . DM (diabetes mellitus), type 2 ([place])   . Dyspnea   . GERD  (gastroesophageal reflux disease)   . HTN (hypertension)   . Hyperlipidemia   . Hypothyroidism   . Iron deficiency anemia   . Ischemic dilated cardiomyopathy ([place])    EF 35-40% by MUGA 6/11  . Myocardial infarction ([place])   . Nasal septal deviation   . Nephrolithiasis   . OSA (obstructive sleep apnea)   . PAF (paroxysmal atrial fibrillation) ([place])   . Peripheral arterial disease (HCC)    left subclavian artery stenosis  . PNA (pneumonia)   . Psoriasis   . Seborrheic keratosis   . Stroke ([place])   . Systolic congestive heart failure ([place]) 2009   s/p BiV ICD implantation by Dr Leonia Reeves (MDT)    Past Surgical History:  Procedure Laterality Date  . BI-VENTRICULAR IMPLANTABLE CARDIOVERTER DEFIBRILLATOR  (CRT-D)  10-08-08; 11-06-2013   Dr Leonia Reeves (MDT) implant for primary prevention; gen change to MDT VivaXT CRTD by Dr Rayann Heman  . BIV ICD GENERTAOR CHANGE OUT N/A 11/06/2013   Procedure: BIV ICD GENERTAOR CHANGE OUT;  Surgeon: Coralyn Mark, MD;  [facility] C[facility] LAB;  Service: Cardiovascular;  Laterality: N/A;  . c-spine surgery    . CARDIOVERSION N/A 04/29/2016   Procedure: CARDIOVERSION;  Surgeon: Pixie Casino, MD;  Location: [place];  Service: Cardiovascular;  Laterality: N/A;  . CARPAL  TUNNEL RELEASE    . CATARACT EXTRACTION    . CORONARY ARTERY BYPASS GRAFT     LIMA to LAD, SVG to OM, SVG to diagonal  . ELECTROPHYSIOLOGIC STUDY N/A 06/11/2016   Procedure: Atrial Fibrillation Ablation;  Surgeon: Thompson Grayer, MD;  Location: [place] CV LAB;  Service: Cardiovascular;  Laterality: N/A;  . left cleft palate and left cleft lip repair       Current Outpatient Prescriptions  Medication Sig Dispense Refill  . acetaminophen (TYLENOL) 500 MG tablet Take 1,000 mg by mouth See admin instructions. Take 2 tablets (1000 mg) by mouth daily at bedtime, may also take 2 tablets every 6 hours as needed for pain/fever    . albuterol (PROVENTIL HFA;VENTOLIN HFA) 108 (90 BASE) MCG/ACT inhaler  Inhale 2 puffs into the lungs every 6 (six) hours as needed for wheezing or shortness of breath. Must keep June 2016 appt. 1 Inhaler 0  . albuterol (PROVENTIL) (2.5 MG/3ML) 0.083% nebulizer solution USE 1 VIAL VIA NEBULIZER 3 TIMES  A DAY AS DIRECTED 270 mL 3  . amiodarone (PACERONE) 400 MG tablet Take 0.5 tablets (200 mg total) by mouth daily. 30 tablet 6  . atorvastatin (LIPITOR) 80 MG tablet TAKE 1 TABLET (80 MG TOTAL) BY MOUTH DAILY. 90 tablet 3  . bisacodyl (DULCOLAX) 5 MG EC tablet Take 2 tablets (10 mg total) by mouth daily at 12 noon. 30 tablet 0  . carvedilol (COREG) 6.25 MG tablet TAKE 1 TABLET BY MOUTH TWICE A DAY 180 tablet 3  . ezetimibe (ZETIA) 10 MG tablet Take 10 mg by mouth daily.    . ferrous sulfate 325 (65 FE) MG tablet Take 325 mg by mouth daily with breakfast.    . fluticasone (FLONASE) 50 MCG/ACT nasal spray PLACE 2 SPRAYS INTO BOTH NOSTRILS DAILY. 16 g 2  . fluticasone furoate-vilanterol (BREO ELLIPTA) 100-25 MCG/INH AEPB Inhale 1 puff into the lungs daily. 60 each 5  . furosemide (LASIX) 40 MG tablet Take 40 mg by mouth 2 (two) times daily.    . insulin aspart (NOVOLOG FLEXPEN) 100 UNIT/ML FlexPen Inject 6 Units into the skin 3 (three) times daily before meals.    . insulin degludec (TRESIBA FLEXTOUCH) 100 UNIT/ML SOPN FlexTouch Pen Inject 0.3 mLs (30 Units total) into the skin daily before lunch. 1 pen 0  . levothyroxine (SYNTHROID, LEVOTHROID) 100 MCG tablet Take 100 mcg by mouth daily before breakfast.     . Multiple Vitamin (MULTIVITAMIN WITH MINERALS) TABS tablet Take 1 tablet by mouth daily.    [name] omeprazole (PRILOSEC) 20 MG capsule Take 20 mg by mouth daily.      [name] PRESCRIPTION MEDICATION Inhale into the lungs at bedtime. CPAP    . senna (SENOKOT) 8.6 MG tablet Take 2 tablets by mouth at bedtime.     [name] SPIRIVA RESPIMAT 2.5 MCG/ACT AERS INHALE 2 PUFFS INTO THE LUNGS DAILY. 1 Inhaler 1  . spironolactone (ALDACTONE) 25 MG tablet TAKE 1/2 TABLET (12.5 MG TOTAL) BY MOUTH  DAILY. 45 tablet 3  . traMADol (ULTRAM) 50 MG tablet Take 1 tablet (50 mg total) by mouth every 6 (six) hours as needed for moderate pain. 30 tablet 0  . vitamin B-12 (CYANOCOBALAMIN) 1000 MCG tablet Take 1,000 mcg by mouth daily.    [name] warfarin (COUMADIN) 5 MG tablet TAKE AS DIRECTED BY COUMADIN CLINIC 40 tablet 3   No current facility-administered medications for this visit.     Allergies:   Aldactone [spironolactone]  Social History:  The patient  reports that he quit smoking about 18 years ago. His smoking use included Cigarettes. He has a 195.00 pack-year smoking history. He has never used smokeless tobacco. He reports that he does not drink alcohol or use drugs.   Family History:  The patient's *family history includes Asthma in his father; Heart attack in his brother; Heart disease in his brother; Hypertension in his brother, father, mother, and sister; Stroke in his father.    ROS:  Please see the history of present illness.   Otherwise, review of systems are positive for chronic cough.   All other systems are reviewed and negative.    PHYSICAL EXAM: VS:  BP 122/62   Pulse 74   Ht 5\' 6"  (1.676 m)   Wt 215 lb 1.9 oz (97.6 kg)   BMI 34.72 kg/m  , BMI Body mass index is 34.72 kg/m. GEN: Well nourished, well developed, in no acute distress  HEENT: normal ; bruise under right eye Neck: no JVD, carotid bruits, or masses Cardiac: RRR; no murmurs, rubs, or gallops,no edema  Respiratory:  clear to auscultation bilaterally, normal work of breathing GI: soft, nontender, nondistended, + BS MS: no deformity or atrophy  Skin: warm and dry, no rash Neuro:  Strength and sensation are intact Psych: euthymic mood, full affect    Recent Labs: 04/21/2016: ALT 16; TSH 2.14 09/03/2016: B Natriuretic Peptide 552.6 09/05/2016: Hemoglobin 11.4; Platelets 216 09/06/2016: BUN 37; Creatinine, Ser 1.46; Potassium 4.2; Sodium 136   Lipid Panel    Component Value Date/Time   CHOL 137  11/12/2014 0921   TRIG 95 11/12/2014 0921   HDL 50 11/12/2014 0921   CHOLHDL 2.6 11/29/2008 0330   VLDL 10 11/29/2008 0330   LDLCALC 68 11/12/2014 0921     Other studies Reviewed: Additional studies/ records that were reviewed today with results demonstrating: Lab results from May 2018 reviewed.  LFTs and TSH controlled in 5/18.   ASSESSMENT AND PLAN:  1. CAD: No anginal symptoms on current medical therapy. Continue aggressive secondary prevention. Since he is already on warfarin, we have stopped aspirin to minimize his bleeding risk. 2. Hyperlipidemia: Well controlled in May 2018. 3. PAF: Status post atrial fibrillation and atrial flutter ablation. Coumadin for stroke prevention. Currently maintaining sinus rhythm on amiodarone.  He neds LFTs and TSH every 6 months due to the Amiodarone. 4. Chronic systolic dysfunction/ischemic cardio myopathy: He appears euvolemic. Continue current dose of Lasix. Only minimal lower extremity edema. Continue to elevate legs when possible. 5. Defibrillator checks with EP. 6. PAD: Known subclavian disease.   Current medicines are reviewed at length with the patient today.  The patient concerns regarding his medicines were addressed.  The following changes have been made:  No change  Labs/ tests ordered today include:  No orders of the defined types were placed in this encounter.   Recommend 150 minutes/week of aerobic exercise Low fat, low carb, high fiber diet recommended  Disposition:   FU in 1 year   Signed, Larae Grooms, MD  04/21/2017 8:29 AM    [facility] [address], [place], [place]  [address] Phone: [phone]; Fax: [phone]

## 2017-04-23 ENCOUNTER — Other Ambulatory Visit: Payer: Self-pay | Admitting: Interventional Cardiology

## 2017-05-02 ENCOUNTER — Encounter: Payer: Self-pay | Admitting: Internal Medicine

## 2017-05-02 ENCOUNTER — Ambulatory Visit (INDEPENDENT_AMBULATORY_CARE_PROVIDER_SITE_OTHER): Payer: Medicare Other

## 2017-05-02 ENCOUNTER — Ambulatory Visit (INDEPENDENT_AMBULATORY_CARE_PROVIDER_SITE_OTHER): Payer: Medicare Other | Admitting: Internal Medicine

## 2017-05-02 ENCOUNTER — Encounter (INDEPENDENT_AMBULATORY_CARE_PROVIDER_SITE_OTHER): Payer: Self-pay

## 2017-05-02 VITALS — BP 108/62 | HR 75 | Ht 66.0 in | Wt 214.4 lb

## 2017-05-02 DIAGNOSIS — I10 Essential (primary) hypertension: Secondary | ICD-10-CM | POA: Diagnosis not present

## 2017-05-02 DIAGNOSIS — I5022 Chronic systolic (congestive) heart failure: Secondary | ICD-10-CM | POA: Diagnosis not present

## 2017-05-02 DIAGNOSIS — G4733 Obstructive sleep apnea (adult) (pediatric): Secondary | ICD-10-CM | POA: Diagnosis not present

## 2017-05-02 DIAGNOSIS — I480 Paroxysmal atrial fibrillation: Secondary | ICD-10-CM

## 2017-05-02 DIAGNOSIS — I4819 Other persistent atrial fibrillation: Secondary | ICD-10-CM

## 2017-05-02 DIAGNOSIS — I481 Persistent atrial fibrillation: Secondary | ICD-10-CM

## 2017-05-02 DIAGNOSIS — I255 Ischemic cardiomyopathy: Secondary | ICD-10-CM | POA: Diagnosis not present

## 2017-05-02 DIAGNOSIS — Z9989 Dependence on other enabling machines and devices: Secondary | ICD-10-CM | POA: Diagnosis not present

## 2017-05-02 DIAGNOSIS — Z7901 Long term (current) use of anticoagulants: Secondary | ICD-10-CM

## 2017-05-02 DIAGNOSIS — I420 Dilated cardiomyopathy: Secondary | ICD-10-CM

## 2017-05-02 LAB — CUP PACEART INCLINIC DEVICE CHECK
Battery Remaining Longevity: 27 mo
Battery Voltage: 2.94 V
Brady Statistic AP VP Percent: 95.93 %
Brady Statistic AP VS Percent: 0.38 %
Brady Statistic AS VP Percent: 2.97 %
Brady Statistic AS VS Percent: 0.72 %
Brady Statistic RA Percent Paced: 95.94 %
Brady Statistic RV Percent Paced: 98.11 %
Date Time Interrogation Session: 20180709115828
HighPow Impedance: 50 Ohm
HighPow Impedance: 67 Ohm
Implantable Lead Implant Date: 20091215
Implantable Lead Implant Date: 20091215
Implantable Lead Implant Date: 20091215
Implantable Lead Location: 753858
Implantable Lead Location: 753859
Implantable Lead Location: 753860
Implantable Lead Model: 4196
Implantable Lead Model: 5076
Implantable Lead Model: 6947
Implantable Pulse Generator Implant Date: 20150113
Lead Channel Impedance Value: 4047 Ohm
Lead Channel Impedance Value: 456 Ohm
Lead Channel Impedance Value: 532 Ohm
Lead Channel Impedance Value: 532 Ohm
Lead Channel Impedance Value: 551 Ohm
Lead Channel Impedance Value: 950 Ohm
Lead Channel Pacing Threshold Amplitude: 0.75 V
Lead Channel Pacing Threshold Amplitude: 0.75 V
Lead Channel Pacing Threshold Amplitude: 1 V
Lead Channel Pacing Threshold Pulse Width: 0.4 ms
Lead Channel Pacing Threshold Pulse Width: 0.6 ms
Lead Channel Pacing Threshold Pulse Width: 1 ms
Lead Channel Sensing Intrinsic Amplitude: 1.25 mV
Lead Channel Sensing Intrinsic Amplitude: 1.375 mV
Lead Channel Sensing Intrinsic Amplitude: 14.75 mV
Lead Channel Sensing Intrinsic Amplitude: 15.75 mV
Lead Channel Setting Pacing Amplitude: 2 V
Lead Channel Setting Pacing Amplitude: 2 V
Lead Channel Setting Pacing Amplitude: 2.5 V
Lead Channel Setting Pacing Pulse Width: 0.6 ms
Lead Channel Setting Pacing Pulse Width: 1 ms
Lead Channel Setting Sensing Sensitivity: 0.3 mV

## 2017-05-02 LAB — POCT INR: INR: 1.9

## 2017-05-02 NOTE — Patient Instructions (Addendum)
Medication Instructions:  Your physician recommends that you continue on your current medications as directed. Please refer to the Current Medication list given to you today.  Labwork: None   Testing/Procedures: None   Follow-Up: Your physician recommends that you schedule a follow-up appointment in: 12 months follow up with Chanetta Marshall, NP    Remote monitoring is used to monitor your  ICD from home. This monitoring reduces the number of office visits required to check your device to one time per year. It allows [place] to keep an eye on the functioning of your device to ensure it is working properly. You are scheduled for a device check from home 07/07/2017. You may send your transmission at any time that day. If you have a wireless device, the transmission will be sent automatically. After your physician reviews your transmission, you will receive a postcard with your next transmission date.    If you need a refill on your cardiac medications before your next appointment, please call your pharmacy.

## 2017-05-02 NOTE — Progress Notes (Signed)
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 male who presents today for routine electrophysiology followup.  Since last being seen in our clinic, the patient reports doing reasonably well.  He had a mechanical fall a month ago.  He continues to recover slowly from this under Dr Delene Ruffini management.  Today, he denies symptoms of palpitations, chest pain, shortness of breath,  lower extremity edema, dizziness, presyncope, syncope, or ICD shocks.  The patient is otherwise without complaint today.   Past Medical History:  Diagnosis Date  . Atrial fibrillation ([place])    persistent, previously seen at [facility] and placed on amiodarone  . Benign prostatic hypertrophy   . CAD (coronary artery disease)    multivessel s/p inferolateral wall MI with subsequent CABG 11/1998.  Cath 2009 with Patent grafts  . Cleft palate   . COPD with emphysema ([place]) 04/01/2010  . DM (diabetes mellitus), type 2 ([place])   . Dyspnea   . GERD (gastroesophageal reflux disease)   . HTN (hypertension)   . Hyperlipidemia   . Hypothyroidism   . Iron deficiency anemia   . Ischemic dilated cardiomyopathy ([place])    EF 35-40% by MUGA 6/11  . Myocardial infarction ([place])   . Nasal septal deviation   . Nephrolithiasis   . OSA (obstructive sleep apnea)   . PAF (paroxysmal atrial fibrillation) ([place])   . Peripheral arterial disease (HCC)    left subclavian artery stenosis  . PNA (pneumonia)   . Psoriasis   . Seborrheic keratosis   . Stroke ([place])   . Systolic congestive heart failure ([place]) 2009   s/p BiV ICD implantation by Dr Leonia Reeves (MDT)   Past Surgical History:  Procedure Laterality Date  . BI-VENTRICULAR IMPLANTABLE CARDIOVERTER DEFIBRILLATOR  (CRT-D)  10-08-08; 11-06-2013   Dr Leonia Reeves (MDT) implant for primary prevention; gen change to MDT VivaXT CRTD by Dr Rayann Heman  . BIV ICD GENERTAOR CHANGE OUT N/A 11/06/2013   Procedure: BIV ICD GENERTAOR CHANGE OUT;  Surgeon: Coralyn Mark,  MD;  [facility] CATH LAB;  Service: Cardiovascular;  Laterality: N/A;  . c-spine surgery    . CARDIOVERSION N/A 04/29/2016   Procedure: CARDIOVERSION;  Surgeon: Pixie Casino, MD;  Location: [facility] ENDOSCOPY;  Service: Cardiovascular;  Laterality: N/A;  . CARPAL TUNNEL RELEASE    . CATARACT EXTRACTION    . CORONARY ARTERY BYPASS GRAFT     LIMA to LAD, SVG to OM, SVG to diagonal  . ELECTROPHYSIOLOGIC STUDY N/A 06/11/2016   Procedure: Atrial Fibrillation Ablation;  Surgeon: Thompson Grayer, MD;  Location: [facility] CV LAB;  Service: Cardiovascular;  Laterality: N/A;  . left cleft palate and left cleft lip repair      ROS- all systems are reviewed and negative except as per HPI above  Current Outpatient Prescriptions  Medication Sig Dispense Refill  . acetaminophen (TYLENOL) 500 MG tablet Take 1,000 mg by mouth See admin instructions. Take 2 tablets (1000 mg) by mouth daily at bedtime, may also take 2 tablets every 6 hours as needed for pain/fever    . albuterol (PROVENTIL HFA;VENTOLIN HFA) 108 (90 BASE) MCG/ACT inhaler Inhale 2 puffs into the lungs every 6 (six) hours as needed for wheezing or shortness of breath. Must keep June 2016 appt. 1 Inhaler 0  . albuterol (PROVENTIL) (2.5 MG/3ML) 0.083% nebulizer solution USE 1 VIAL VIA NEBULIZER 3 TIMES  A DAY AS DIRECTED 270 mL 3  . amiodarone (PACERONE) 400 MG tablet  Take 0.5 tablets (200 mg total) by mouth daily. 30 tablet 6  . atorvastatin (LIPITOR) 80 MG tablet TAKE 1 TABLET (80 MG TOTAL) BY MOUTH DAILY. 90 tablet 3  . bisacodyl (DULCOLAX) 5 MG EC tablet Take 2 tablets (10 mg total) by mouth daily at 12 noon. 30 tablet 0  . carvedilol (COREG) 6.25 MG tablet TAKE 1 TABLET BY MOUTH TWICE A DAY 180 tablet 3  . ezetimibe (ZETIA) 10 MG tablet Take 10 mg by mouth daily.    . ferrous sulfate 325 (65 FE) MG tablet Take 325 mg by mouth daily with breakfast.    . fluticasone (FLONASE) 50 MCG/ACT nasal spray PLACE 2 SPRAYS INTO BOTH NOSTRILS DAILY. 16 g 2  .  fluticasone furoate-vilanterol (BREO ELLIPTA) 100-25 MCG/INH AEPB Inhale 1 puff into the lungs daily. 60 each 5  . furosemide (LASIX) 40 MG tablet Take 40 mg by mouth 2 (two) times daily.    . insulin aspart (NOVOLOG FLEXPEN) 100 UNIT/ML FlexPen Inject 6 Units into the skin 3 (three) times daily before meals.    . insulin degludec (TRESIBA FLEXTOUCH) 100 UNIT/ML SOPN FlexTouch Pen Inject 0.3 mLs (30 Units total) into the skin daily before lunch. 1 pen 0  . levothyroxine (SYNTHROID, LEVOTHROID) 100 MCG tablet Take 100 mcg by mouth daily before breakfast.     . Multiple Vitamin (MULTIVITAMIN WITH MINERALS) TABS tablet Take 1 tablet by mouth daily.    [name] omeprazole (PRILOSEC) 20 MG capsule Take 20 mg by mouth daily.      [name] PRESCRIPTION MEDICATION Inhale into the lungs at bedtime. CPAP    . senna (SENOKOT) 8.6 MG tablet Take 2 tablets by mouth at bedtime.     [name] SPIRIVA RESPIMAT 2.5 MCG/ACT AERS INHALE 2 PUFFS INTO THE LUNGS DAILY. 1 Inhaler 1  . spironolactone (ALDACTONE) 25 MG tablet TAKE 1/2 TABLET (12.5 MG TOTAL) BY MOUTH DAILY. 45 tablet 3  . traMADol (ULTRAM) 50 MG tablet Take 1 tablet (50 mg total) by mouth every 6 (six) hours as needed for moderate pain. 30 tablet 0  . vitamin B-12 (CYANOCOBALAMIN) 1000 MCG tablet Take 1,000 mcg by mouth daily.    [name] warfarin (COUMADIN) 5 MG tablet TAKE AS DIRECTED BY COUMADIN CLINIC 40 tablet 3   No current facility-administered medications for this visit.     Physical Exam: Vitals:   05/02/17 1105  BP: 108/62  Pulse: 75  Weight: 214 lb 6.4 oz (97.3 kg)  Height: 5\' 6"  (1.676 m)    GEN- The patient is well appearing, alert and oriented x 3 today.   Head- normocephalic, atraumatic Eyes-  Sclera clear, conjunctiva pink Ears- hearing intact Oropharynx- clear Lungs- Clear to ausculation bilaterally, normal work of breathing Chest- ICD pocket is well healed Heart- Regular rate and rhythm, no murmurs, rubs or gallops, PMI not laterally displaced GI-  soft, NT, ND, + BS Extremities- no clubbing, cyanosis, or edema  ICD interrogation- reviewed in detail today,  See PACEART report  Assessment and Plan:  1.  Chronic systolic dysfunction euvolemic today Stable on an appropriate medical regimen Due to RV lead failure, ICD therapies are chronically programmed off.  He biv paces normally and device function is otherwise good. See Pace Art report No changes today  2. Persistent afib Maintaining sinus rhythm post ablation with amiodarone 200mg  daily Lfts, tfts today  3. OSA Compliance with CPAP encouraged  4. CAD No ischemic symptoms No changes   5. HTN Stable No change required today  6. Obesity weight loss encouraged   Follow-up with Dr Irish Lack as scheduled carelink Return to see EP NP in 12 months  Thompson Grayer MD, [facility] 05/02/2017 11:33 AM

## 2017-05-03 LAB — TSH: TSH: 4.2 u[IU]/mL (ref 0.450–4.500)

## 2017-05-03 LAB — T4: T4, Total: 8.9 ug/dL (ref 4.5–12.0)

## 2017-05-12 ENCOUNTER — Encounter: Payer: Self-pay | Admitting: Pulmonary Disease

## 2017-05-12 ENCOUNTER — Ambulatory Visit (INDEPENDENT_AMBULATORY_CARE_PROVIDER_SITE_OTHER): Payer: Medicare Other | Admitting: Pulmonary Disease

## 2017-05-12 VITALS — BP 104/56 | HR 74 | Ht 66.0 in | Wt 212.2 lb

## 2017-05-12 DIAGNOSIS — J432 Centrilobular emphysema: Secondary | ICD-10-CM | POA: Diagnosis not present

## 2017-05-12 DIAGNOSIS — G4733 Obstructive sleep apnea (adult) (pediatric): Secondary | ICD-10-CM | POA: Diagnosis not present

## 2017-05-12 MED ORDER — FLUTICASONE-UMECLIDIN-VILANT 100-62.5-25 MCG/INH IN AEPB: 1.0000 | INHALATION_SPRAY | Freq: Every day | RESPIRATORY_TRACT | 0 refills | Status: DC

## 2017-05-12 MED ORDER — FLUTICASONE-UMECLIDIN-VILANT 100-62.5-25 MCG/INH IN AEPB: 1.0000 | INHALATION_SPRAY | Freq: Every day | RESPIRATORY_TRACT | 5 refills | Status: DC

## 2017-05-12 NOTE — Progress Notes (Signed)
Current Outpatient Prescriptions on File Prior to Visit  Medication Sig  . acetaminophen (TYLENOL) 500 MG tablet Take 1,000 mg by mouth See admin instructions. Take 2 tablets (1000 mg) by mouth daily at bedtime, may also take 2 tablets every 6 hours as needed for pain/fever  . albuterol (PROVENTIL HFA;VENTOLIN HFA) 108 (90 BASE) MCG/ACT inhaler Inhale 2 puffs into the lungs every 6 (six) hours as needed for wheezing or shortness of breath. Must keep June 2016 appt.  [name] albuterol (PROVENTIL) (2.5 MG/3ML) 0.083% nebulizer solution USE 1 VIAL VIA NEBULIZER 3 TIMES  A DAY AS DIRECTED  . amiodarone (PACERONE) 400 MG tablet Take 0.5 tablets (200 mg total) by mouth daily.  [name] atorvastatin (LIPITOR) 80 MG tablet TAKE 1 TABLET (80 MG TOTAL) BY MOUTH DAILY.  . bisacodyl (DULCOLAX) 5 MG EC tablet Take 2 tablets (10 mg total) by mouth daily at 12 noon.  . carvedilol (COREG) 6.25 MG tablet TAKE 1 TABLET BY MOUTH TWICE A DAY  . ezetimibe (ZETIA) 10 MG tablet Take 10 mg by mouth daily.  . ferrous sulfate 325 (65 FE) MG tablet Take 325 mg by mouth daily with breakfast.  . fluticasone (FLONASE) 50 MCG/ACT nasal spray PLACE 2 SPRAYS INTO BOTH NOSTRILS DAILY.  . fluticasone furoate-vilanterol (BREO ELLIPTA) 100-25 MCG/INH AEPB Inhale 1 puff into the lungs daily.  . furosemide (LASIX) 40 MG tablet Take 40 mg by mouth 2 (two) times daily.  . insulin aspart (NOVOLOG FLEXPEN) 100 UNIT/ML FlexPen Inject 6 Units into the skin 3 (three) times daily before meals.  . insulin degludec (TRESIBA FLEXTOUCH) 100 UNIT/ML SOPN FlexTouch Pen Inject 0.3 mLs (30 Units total) into the skin daily before lunch.  . levothyroxine (SYNTHROID, LEVOTHROID) 100 MCG tablet Take 100 mcg by mouth daily before breakfast.   . Multiple Vitamin (MULTIVITAMIN WITH MINERALS) TABS tablet Take 1 tablet by mouth daily.  [name] omeprazole (PRILOSEC) 20 MG capsule Take 20 mg by mouth daily.    [name] PRESCRIPTION MEDICATION Inhale into the lungs at bedtime. CPAP  .  senna (SENOKOT) 8.6 MG tablet Take 2 tablets by mouth at bedtime.   [name] SPIRIVA RESPIMAT 2.5 MCG/ACT AERS INHALE 2 PUFFS INTO THE LUNGS DAILY.  [name] spironolactone (ALDACTONE) 25 MG tablet TAKE 1/2 TABLET (12.5 MG TOTAL) BY MOUTH DAILY.  . traMADol (ULTRAM) 50 MG tablet Take 1 tablet (50 mg total) by mouth every 6 (six) hours as needed for moderate pain.  . vitamin B-12 (CYANOCOBALAMIN) 1000 MCG tablet Take 1,000 mcg by mouth daily.  [name] warfarin (COUMADIN) 5 MG tablet TAKE AS DIRECTED BY COUMADIN CLINIC   No current facility-administered medications on file prior to visit.     Chief Complaint  Patient presents with  . Follow-up    Wears CPAP nightly. Pt did not wear for about 2 weeks d/t an injury. Denies problems with mask/pressure. DME: [facility]    Sleep tests PSG 06/14/06>>AHI 35.5  Auto CPAP 05/19/16 to 06/17/16 >> used on 28 to 30 nights with average 6 hrs 57 min.  Average AHI 18.7 with median CPAP 8 and 95 th percentile CPAP 11 cm H2O. CPAP titration 06/23/16 >> CPAP 16 cm H2O >> AHI 6.7 CPAP 02/11/17 to 05/11/17 >> used on 76 of 90 nights with average 6 hrs 4 min.  Average AHI 12 with CPAP 16 cm H2O.  Air leak  Pulmonary tests PFT 03/30/11>>FEV1 1.98(72%), FEV1% 68, DLCO 64%  PFT 04/19/12>>FEV1 1.65 (67%), FEV1% 63, TLC 5.66 (99%), DLCO 71%, no BD.  PFT 12/19/13 >> FEV1 2.03 (77%), FEV1% 72, TLC 6.45 (98%), DLCO 49% CT chest 01/18/14 >> mild centrilobular and paraseptal emphysema, mild fibrotic changes Rt periphery CT chest 01/15/16 >> emphysema  Cardiac tests Echo 01/11/16 >> EF 35 to 40%, mild LVH, grade 2 diastolic CHF, mod/severe LA dilation, PAS 23 mmHg  Past medical history CAD, systolic CHF, PAD, HTN, DM, HLD, A fib, Hypothyroidism, BPH, Psoriasis, GERD  Past surgical history, Family history, Social history, Allergies reviewed.  Vital signs BP (!) 104/56 (BP Location: Right Arm, Cuff Size: Normal)   Pulse 74   Ht 5\' 6"  (1.676 m)   Wt 212 lb 3.2 oz (96.3 kg)   SpO2 94%   BMI 34.25  kg/m    History of Present Illness: [name] is a 83 y.o. male former smoker with COPD/Emphysema, and OSA.  He fell at beginning of June and had several rib fractures.  He is slowly recovering.  During this time he had trouble using CPAP, but has since resumed.  He gets occasional mask leak, but moves his head around and this improves.  He otherwise feels he is sleeping well and rested during the day.  He uses albuterol in the morning as a matter of routine.  He still has cough with clear sputum.  He is not having fever, wheeze, hemoptysis, sinus congestion, or swelling.  Physical Exam:  General - pleasant Eyes - pupils reactive ENT - no sinus tenderness, no oral exudate, no LAN, changes of cleft lip, MP 2, deceased AP diameter Cardiac - regular, no murmur Chest - no wheeze, rales Abd - soft, non tender Ext - no edema Skin - no rashes Neuro - normal strength Psych - normal mood  CXR 04/01/17 >> Rt 7, 8, 9 rib fx  Assessment/Plan:  COPD with emphysema. - will have him complete current scripts for breo and spiriva - will then have him try switching to trelegy >> demonstrated inhaler technique - prn albuterol >> discussed role of rescue medications  Obstructive sleep apnea. - he is compliant with CPAP and reports benefit from therapy - he has air leak on CPAP report, but not causing any difficulties with his sleep >> continue current mask set up - continue CPAP 16 cm H2O   Patient Instructions  Finish current prescriptions for breo and spiriva.  Once these are done, then switch to trelegy one puff daily.  You will need to rinse your mouth after each use of trelegy.  Stop using breo and spiriva once you start trelegy.  Follow up in 1 year  Time spent 26 minutes  Chesley Mires, MD [place] Pager:  [phone] 05/12/2017, 9:50 AM

## 2017-05-12 NOTE — Patient Instructions (Signed)
Finish current prescriptions for breo and spiriva.  Once these are done, then switch to trelegy one puff daily.  You will need to rinse your mouth after each use of trelegy.  Stop using breo and spiriva once you start trelegy.  Follow up in 1 year

## 2017-05-23 ENCOUNTER — Ambulatory Visit (INDEPENDENT_AMBULATORY_CARE_PROVIDER_SITE_OTHER): Payer: Medicare Other | Admitting: *Deleted

## 2017-05-23 ENCOUNTER — Encounter (INDEPENDENT_AMBULATORY_CARE_PROVIDER_SITE_OTHER): Payer: Self-pay

## 2017-05-23 DIAGNOSIS — Z7901 Long term (current) use of anticoagulants: Secondary | ICD-10-CM | POA: Diagnosis not present

## 2017-05-23 DIAGNOSIS — I480 Paroxysmal atrial fibrillation: Secondary | ICD-10-CM

## 2017-05-23 LAB — POCT INR: INR: 1.8

## 2017-06-02 ENCOUNTER — Ambulatory Visit (INDEPENDENT_AMBULATORY_CARE_PROVIDER_SITE_OTHER): Payer: Medicare Other

## 2017-06-02 ENCOUNTER — Telehealth: Payer: Self-pay | Admitting: Cardiology

## 2017-06-02 DIAGNOSIS — I5022 Chronic systolic (congestive) heart failure: Secondary | ICD-10-CM

## 2017-06-02 DIAGNOSIS — Z95810 Presence of automatic (implantable) cardiac defibrillator: Secondary | ICD-10-CM | POA: Diagnosis not present

## 2017-06-02 NOTE — Telephone Encounter (Signed)
LMOVM reminding pt to send remote transmission.   

## 2017-06-03 NOTE — Progress Notes (Signed)
EPIC Encounter for ICM Monitoring  Patient Name: [name] is a 83 y.o. male Date: 06/03/2017 Primary Care Physican: Griffin, John, MD Primary Cardiologist:[place] Electrophysiologist: Allred Dry Weight: 207lbs Bi-V Pacing: 99.5% Time in AT/AF 0.0 hr/day (0.0%)       Spoke with wife. Heart Failure questions reviewed, pt asymptomatic.   Thoracic impedance abnormal suggesting fluid accumulation [id number]/[id number].  Prescribed dosage: Furosemide 40 mg 1 tablet twice a day  Labs: 11/13/2017Creatinine 1.46, BUN 37, Potassium 4.2, Sodium 136, EGFR 44-51 11/12/2017Creatinine 1.33, BUN 30, Potassium 4.2, Sodium 137, EGFR 49-57  11/11/2017Creatinine 1.27, BUN 21, Potassium 4.1, Sodium 136, EGFR 52-60  11/10/2017Creatinine 1.48, BUN 21, Potassium 3.9, Sodium 135, EGFR 43-50  07/02/2016 Creatinine 1.69, BUN 23, Potassium 4.6, Sodium 140  06/23/2016 Creatinine 1.68, BUN 26, Potassium 4.9, Sodium 138  06/02/2016 Creatinine 1.28, BUN 27, Potassium 5.3, Sodium 139   Recommendations: No changes.  Advised to limit salt intake to 2000 mg/day and fluid intake to < 2 liters/day.  Encouraged to call for fluid symptoms.  Follow-up plan: ICM clinic phone appointment on 06/09/2017 to recheck fluid levels.    Copy of ICM check sent to Dr Varanasi and Dr Allred for review and if any recommendations will call back.   3 month ICM trend: 06/03/2017   1 Year ICM trend:      [name], RN 06/03/2017 9:00 AM    

## 2017-06-04 ENCOUNTER — Other Ambulatory Visit: Payer: Self-pay | Admitting: Pulmonary Disease

## 2017-06-06 ENCOUNTER — Ambulatory Visit (INDEPENDENT_AMBULATORY_CARE_PROVIDER_SITE_OTHER): Payer: Medicare Other | Admitting: *Deleted

## 2017-06-06 ENCOUNTER — Encounter (INDEPENDENT_AMBULATORY_CARE_PROVIDER_SITE_OTHER): Payer: Self-pay

## 2017-06-06 DIAGNOSIS — I480 Paroxysmal atrial fibrillation: Secondary | ICD-10-CM | POA: Diagnosis not present

## 2017-06-06 DIAGNOSIS — Z7901 Long term (current) use of anticoagulants: Secondary | ICD-10-CM

## 2017-06-06 LAB — POCT INR: INR: 3.1

## 2017-06-09 ENCOUNTER — Ambulatory Visit (INDEPENDENT_AMBULATORY_CARE_PROVIDER_SITE_OTHER): Payer: Self-pay

## 2017-06-09 DIAGNOSIS — Z95810 Presence of automatic (implantable) cardiac defibrillator: Secondary | ICD-10-CM

## 2017-06-09 DIAGNOSIS — I5022 Chronic systolic (congestive) heart failure: Secondary | ICD-10-CM

## 2017-06-09 NOTE — Progress Notes (Signed)
EPIC Encounter for ICM Monitoring  Patient Name: [name] is a 83 y.o. male Date: 06/09/2017 Primary Care Physican: [name], MD Primary [place] Electrophysiologist: Allred Dry Weight: 208lbs Bi-V Pacing: 99.8% Time in AT/AF 0.0 hr/day (0.0%)      Spoke with wife.  Heart Failure questions reviewed, pt had physical by PCP yesterday and noticed there was a little swelling in his feet.    Thoracic impedance abnormal suggesting fluid accumulation starting 05/23/17 with exception of 1 day at baseline.  Prescribed dosage: Furosemide 40 mg 1 tablet twice a day  Labs: 11/13/2017Creatinine 1.46, BUN 37, Potassium 4.2, Sodium 136, EGFR 44-51 11/12/2017Creatinine 1.33, BUN 30, Potassium 4.2, Sodium 137, EGFR 49-57  11/11/2017Creatinine 1.27, BUN 21, Potassium 4.1, Sodium 136, EGFR 52-60  11/10/2017Creatinine 1.48, BUN 21, Potassium 3.9, Sodium 135, EGFR 43-50  07/02/2016 Creatinine 1.69, BUN 23, Potassium 4.6, Sodium 140  06/23/2016 Creatinine 1.68, BUN 26, Potassium 4.9, Sodium 138  06/02/2016 Creatinine 1.28, BUN 27, Potassium 5.3, Sodium 139   Recommendations: No changes.   Encouraged to call for fluid symptoms.  Follow-up plan: ICM clinic phone appointment on 07/07/2017.    Copy of ICM check sent to Dr Irish Lack and Dr Rayann Heman.   3 month ICM trend: 06/09/2017   1 Year ICM trend:      Rosalene Billings, RN 06/09/2017 11:37 AM

## 2017-06-21 ENCOUNTER — Ambulatory Visit (INDEPENDENT_AMBULATORY_CARE_PROVIDER_SITE_OTHER): Payer: Medicare Other | Admitting: *Deleted

## 2017-06-21 DIAGNOSIS — I480 Paroxysmal atrial fibrillation: Secondary | ICD-10-CM

## 2017-06-21 DIAGNOSIS — Z7901 Long term (current) use of anticoagulants: Secondary | ICD-10-CM | POA: Diagnosis not present

## 2017-06-21 LAB — POCT INR: INR: 2.3

## 2017-07-07 ENCOUNTER — Ambulatory Visit (INDEPENDENT_AMBULATORY_CARE_PROVIDER_SITE_OTHER): Payer: Medicare Other | Admitting: *Deleted

## 2017-07-07 DIAGNOSIS — I5022 Chronic systolic (congestive) heart failure: Secondary | ICD-10-CM

## 2017-07-07 DIAGNOSIS — I420 Dilated cardiomyopathy: Secondary | ICD-10-CM

## 2017-07-07 DIAGNOSIS — Z95810 Presence of automatic (implantable) cardiac defibrillator: Secondary | ICD-10-CM | POA: Diagnosis not present

## 2017-07-07 DIAGNOSIS — I255 Ischemic cardiomyopathy: Secondary | ICD-10-CM

## 2017-07-07 NOTE — Progress Notes (Signed)
Remote ICD transmission.   

## 2017-07-07 NOTE — Progress Notes (Signed)
EPIC Encounter for ICM Monitoring  Patient Name: [name] is a 83 y.o. male Date: 07/07/2017 Primary Care Physican: [name], MD Primary [place] Electrophysiologist: Allred Dry Weight: 202lbs Bi-V Pacing: 99.8%      Spoke with wife. Heart Failure questions reviewed, pt asymptomatic.   Thoracic impedance normal.  Prescribed dosage: Furosemide 40 mg 1 tablet twice a day  Labs: 11/13/2017Creatinine 1.46, BUN 37, Potassium 4.2, Sodium 136, EGFR 44-51 11/12/2017Creatinine 1.33, BUN 30, Potassium 4.2, Sodium 137, EGFR 49-57  11/11/2017Creatinine 1.27, BUN 21, Potassium 4.1, Sodium 136, EGFR 52-60  11/10/2017Creatinine 1.48, BUN 21, Potassium 3.9, Sodium 135, EGFR 43-50  07/02/2016 Creatinine 1.69, BUN 23, Potassium 4.6, Sodium 140  06/23/2016 Creatinine 1.68, BUN 26, Potassium 4.9, Sodium 138  06/02/2016 Creatinine 1.28, BUN 27, Potassium 5.3, Sodium 139   Recommendations: No changes.   Encouraged to call for fluid symptoms.  Follow-up plan: ICM clinic phone appointment on 08/09/2017.    Copy of ICM check sent to Dr. Rayann Heman.   3 month ICM trend: 07/07/2017   1 Year ICM trend:      Rosalene Billings, RN 07/07/2017 3:43 PM

## 2017-07-08 ENCOUNTER — Encounter: Payer: Self-pay | Admitting: Cardiology

## 2017-07-08 LAB — CUP PACEART REMOTE DEVICE CHECK
Battery Remaining Longevity: 21 mo
Battery Voltage: 2.94 V
Brady Statistic AP VP Percent: 98.56 %
Brady Statistic AP VS Percent: 0.09 %
Brady Statistic AS VP Percent: 1.34 %
Brady Statistic AS VS Percent: 0.01 %
Brady Statistic RA Percent Paced: 98.53 %
Brady Statistic RV Percent Paced: 99.66 %
Date Time Interrogation Session: 20180913073425
HighPow Impedance: 47 Ohm
HighPow Impedance: 59 Ohm
Implantable Lead Implant Date: 20091215
Implantable Lead Implant Date: 20091215
Implantable Lead Implant Date: 20091215
Implantable Lead Location: 753858
Implantable Lead Location: 753859
Implantable Lead Location: 753860
Implantable Lead Model: 4196
Implantable Lead Model: 5076
Implantable Lead Model: 6947
Implantable Pulse Generator Implant Date: 20150113
Lead Channel Impedance Value: 4047 Ohm
Lead Channel Impedance Value: 418 Ohm
Lead Channel Impedance Value: 456 Ohm
Lead Channel Impedance Value: 475 Ohm
Lead Channel Impedance Value: 513 Ohm
Lead Channel Impedance Value: 874 Ohm
Lead Channel Pacing Threshold Amplitude: 0.875 V
Lead Channel Pacing Threshold Amplitude: 1 V
Lead Channel Pacing Threshold Amplitude: 1.625 V
Lead Channel Pacing Threshold Pulse Width: 0.4 ms
Lead Channel Pacing Threshold Pulse Width: 0.4 ms
Lead Channel Pacing Threshold Pulse Width: 0.6 ms
Lead Channel Sensing Intrinsic Amplitude: 0.625 mV
Lead Channel Sensing Intrinsic Amplitude: 0.625 mV
Lead Channel Sensing Intrinsic Amplitude: 13.5 mV
Lead Channel Sensing Intrinsic Amplitude: 13.5 mV
Lead Channel Setting Pacing Amplitude: 1.75 V
Lead Channel Setting Pacing Amplitude: 2 V
Lead Channel Setting Pacing Amplitude: 2.5 V
Lead Channel Setting Pacing Pulse Width: 0.6 ms
Lead Channel Setting Pacing Pulse Width: 1 ms
Lead Channel Setting Sensing Sensitivity: 0.3 mV

## 2017-07-19 ENCOUNTER — Ambulatory Visit (INDEPENDENT_AMBULATORY_CARE_PROVIDER_SITE_OTHER): Payer: Medicare Other | Admitting: *Deleted

## 2017-07-19 DIAGNOSIS — I480 Paroxysmal atrial fibrillation: Secondary | ICD-10-CM | POA: Diagnosis not present

## 2017-07-19 DIAGNOSIS — Z5181 Encounter for therapeutic drug level monitoring: Secondary | ICD-10-CM | POA: Diagnosis not present

## 2017-07-19 DIAGNOSIS — Z7901 Long term (current) use of anticoagulants: Secondary | ICD-10-CM

## 2017-07-19 LAB — POCT INR: INR: 2.5

## 2017-07-28 ENCOUNTER — Other Ambulatory Visit: Payer: Self-pay | Admitting: Pulmonary Disease

## 2017-08-09 ENCOUNTER — Telehealth: Payer: Self-pay | Admitting: Cardiology

## 2017-08-09 ENCOUNTER — Ambulatory Visit (INDEPENDENT_AMBULATORY_CARE_PROVIDER_SITE_OTHER): Payer: Medicare Other

## 2017-08-09 DIAGNOSIS — I5022 Chronic systolic (congestive) heart failure: Secondary | ICD-10-CM | POA: Diagnosis not present

## 2017-08-09 DIAGNOSIS — Z95810 Presence of automatic (implantable) cardiac defibrillator: Secondary | ICD-10-CM

## 2017-08-09 NOTE — Telephone Encounter (Signed)
Confirmed remote transmission w/ pt wife.   

## 2017-08-11 ENCOUNTER — Telehealth: Payer: Self-pay

## 2017-08-11 NOTE — Progress Notes (Signed)
EPIC Encounter for ICM Monitoring  Patient Name: [name] is a 83 y.o. male Date: 08/11/2017 Primary Care Physican: [name], MD Primary [place] Electrophysiologist: Allred Dry Weight:last weight 202lbs Bi-V Pacing: 97.3%                                                 Attempted call to wife, [name] and unable to reach.  Left detailed message regarding transmission.  Transmission reviewed.    Thoracic impedance normal.  Prescribed dosage: Furosemide 40 mg 1 tablet twice a day  Labs: 11/13/2017Creatinine 1.46, BUN 37, Potassium 4.2, Sodium 136, EGFR 44-51 11/12/2017Creatinine 1.33, BUN 30, Potassium 4.2, Sodium 137, EGFR 49-57  11/11/2017Creatinine 1.27, BUN 21, Potassium 4.1, Sodium 136, EGFR 52-60  11/10/2017Creatinine 1.48, BUN 21, Potassium 3.9, Sodium 135, EGFR 43-50  07/02/2016 Creatinine 1.69, BUN 23, Potassium 4.6, Sodium 140  06/23/2016 Creatinine 1.68, BUN 26, Potassium 4.9, Sodium 138  06/02/2016 Creatinine 1.28, BUN 27, Potassium 5.3, Sodium 139  Recommendations: Left voice mail with ICM number and encouraged to call if experiencing any fluid symptoms.  Follow-up plan: ICM clinic phone appointment on 09/12/2017.    Copy of ICM check sent to Dr. Rayann Heman.  3 month ICM trend: 08/10/2017   1 Year ICM trend:      Rosalene Billings, RN 08/11/2017 10:43 AM

## 2017-08-11 NOTE — Telephone Encounter (Signed)
Remote ICM transmission received.  Attempted call to patient and left detailed message per DPR regarding transmission and next ICM scheduled for 09/12/2017.  Advised to return call for any fluid symptoms or questions.    

## 2017-08-15 ENCOUNTER — Ambulatory Visit (INDEPENDENT_AMBULATORY_CARE_PROVIDER_SITE_OTHER): Payer: Medicare Other

## 2017-08-15 DIAGNOSIS — Z7901 Long term (current) use of anticoagulants: Secondary | ICD-10-CM | POA: Diagnosis not present

## 2017-08-15 DIAGNOSIS — I480 Paroxysmal atrial fibrillation: Secondary | ICD-10-CM | POA: Diagnosis not present

## 2017-08-15 DIAGNOSIS — Z23 Encounter for immunization: Secondary | ICD-10-CM

## 2017-08-15 LAB — POCT INR: INR: 1.8

## 2017-09-05 ENCOUNTER — Ambulatory Visit (INDEPENDENT_AMBULATORY_CARE_PROVIDER_SITE_OTHER): Payer: Medicare Other | Admitting: *Deleted

## 2017-09-05 DIAGNOSIS — I480 Paroxysmal atrial fibrillation: Secondary | ICD-10-CM | POA: Diagnosis not present

## 2017-09-05 DIAGNOSIS — Z7901 Long term (current) use of anticoagulants: Secondary | ICD-10-CM

## 2017-09-05 LAB — POCT INR: INR: 1.4

## 2017-09-12 ENCOUNTER — Ambulatory Visit (INDEPENDENT_AMBULATORY_CARE_PROVIDER_SITE_OTHER): Payer: Medicare Other

## 2017-09-12 DIAGNOSIS — I5022 Chronic systolic (congestive) heart failure: Secondary | ICD-10-CM | POA: Diagnosis not present

## 2017-09-12 DIAGNOSIS — Z95810 Presence of automatic (implantable) cardiac defibrillator: Secondary | ICD-10-CM | POA: Diagnosis not present

## 2017-09-12 NOTE — Progress Notes (Signed)
EPIC Encounter for ICM Monitoring  Patient Name: [name] is a 83 y.o. male Date: 09/12/2017 Primary Care Physican: Griffin, John, MD Primary Cardiologist:Varanasi Electrophysiologist: Allred Dry Weight:  200lbs Bi-V Pacing: 99.93%  Call to wife, [name].   Heart Failure questions reviewed, pt asymptomatic.   Thoracic impedance normal.  Prescribed dosage: Furosemide 40 mg 1 tablet twice a day  Labs: 11/13/2017Creatinine 1.46, BUN 37, Potassium 4.2, Sodium 136, EGFR 44-51 11/12/2017Creatinine 1.33, BUN 30, Potassium 4.2, Sodium 137, EGFR 49-57  11/11/2017Creatinine 1.27, BUN 21, Potassium 4.1, Sodium 136, EGFR 52-60  11/10/2017Creatinine 1.48, BUN 21, Potassium 3.9, Sodium 135, EGFR 43-50  07/02/2016 Creatinine 1.69, BUN 23, Potassium 4.6, Sodium 140  06/23/2016 Creatinine 1.68, BUN 26, Potassium 4.9, Sodium 138  06/02/2016 Creatinine 1.28, BUN 27, Potassium 5.3, Sodium 139  Recommendations: No changes.  Encouraged to call for fluid symptoms.  Follow-up plan: ICM clinic phone appointment on 10/13/2017.    Copy of ICM check sent to Dr. Allred.   3 month ICM trend: 09/12/2017    1 Year ICM trend:       [name], RN 09/12/2017 9:54 AM    

## 2017-09-13 ENCOUNTER — Ambulatory Visit (INDEPENDENT_AMBULATORY_CARE_PROVIDER_SITE_OTHER): Payer: Medicare Other | Admitting: *Deleted

## 2017-09-13 DIAGNOSIS — I480 Paroxysmal atrial fibrillation: Secondary | ICD-10-CM | POA: Diagnosis not present

## 2017-09-13 DIAGNOSIS — Z7901 Long term (current) use of anticoagulants: Secondary | ICD-10-CM

## 2017-09-13 LAB — POCT INR: INR: 1.5

## 2017-09-13 NOTE — Patient Instructions (Signed)
Take 1.5 tablets today,  then change your dose to 1 tablet everyday.  Continue eating 2-3 servings of leafy green vegetable each week.  Recheck in 10 days.  Call [phone] with any new medications.

## 2017-09-23 ENCOUNTER — Ambulatory Visit (INDEPENDENT_AMBULATORY_CARE_PROVIDER_SITE_OTHER): Payer: Medicare Other | Admitting: *Deleted

## 2017-09-23 DIAGNOSIS — I480 Paroxysmal atrial fibrillation: Secondary | ICD-10-CM

## 2017-09-23 DIAGNOSIS — Z7901 Long term (current) use of anticoagulants: Secondary | ICD-10-CM

## 2017-09-23 DIAGNOSIS — Z5181 Encounter for therapeutic drug level monitoring: Secondary | ICD-10-CM

## 2017-09-23 DIAGNOSIS — I481 Persistent atrial fibrillation: Secondary | ICD-10-CM | POA: Diagnosis not present

## 2017-09-23 DIAGNOSIS — I4819 Other persistent atrial fibrillation: Secondary | ICD-10-CM

## 2017-09-23 LAB — POCT INR: INR: 2.5

## 2017-09-23 NOTE — Patient Instructions (Signed)
Continue same dose of coumadin  1 tablet everyday.  Continue eating 2-3 servings of leafy green vegetable each week.  Recheck in 2 1/2 weeks.  Call [phone] with any new medications.

## 2017-10-11 ENCOUNTER — Ambulatory Visit (INDEPENDENT_AMBULATORY_CARE_PROVIDER_SITE_OTHER): Payer: PPO | Admitting: *Deleted

## 2017-10-11 DIAGNOSIS — I480 Paroxysmal atrial fibrillation: Secondary | ICD-10-CM

## 2017-10-11 DIAGNOSIS — Z5181 Encounter for therapeutic drug level monitoring: Secondary | ICD-10-CM | POA: Diagnosis not present

## 2017-10-11 DIAGNOSIS — Z7901 Long term (current) use of anticoagulants: Secondary | ICD-10-CM

## 2017-10-11 DIAGNOSIS — I4819 Other persistent atrial fibrillation: Secondary | ICD-10-CM

## 2017-10-11 DIAGNOSIS — I481 Persistent atrial fibrillation: Secondary | ICD-10-CM

## 2017-10-11 LAB — POCT INR: INR: 2.3

## 2017-10-11 NOTE — Patient Instructions (Signed)
Description   Continue same dose of coumadin 1 tablet everyday.  Continue eating 2-3 servings of leafy green vegetable each week.  Recheck in 4 weeks.  Call [phone] with any new medications.

## 2017-10-13 ENCOUNTER — Ambulatory Visit (INDEPENDENT_AMBULATORY_CARE_PROVIDER_SITE_OTHER): Payer: PPO | Admitting: *Deleted

## 2017-10-13 DIAGNOSIS — I255 Ischemic cardiomyopathy: Secondary | ICD-10-CM

## 2017-10-13 DIAGNOSIS — I420 Dilated cardiomyopathy: Secondary | ICD-10-CM

## 2017-10-13 DIAGNOSIS — I5022 Chronic systolic (congestive) heart failure: Secondary | ICD-10-CM

## 2017-10-13 DIAGNOSIS — Z95810 Presence of automatic (implantable) cardiac defibrillator: Secondary | ICD-10-CM | POA: Diagnosis not present

## 2017-10-13 NOTE — Progress Notes (Signed)
Remote ICD transmission.   

## 2017-10-14 ENCOUNTER — Encounter: Payer: Self-pay | Admitting: Cardiology

## 2017-10-14 NOTE — Progress Notes (Signed)
EPIC Encounter for ICM Monitoring  Patient Name: [name] is a 83 y.o. male Date: 10/14/2017 Primary Care Physican: [name], MD Primary [place] Electrophysiologist: Allred Dry Weight:last weight 202lbs Bi-V Pacing: 98.8%      Transmission received.   Thoracic impedance normal.  Prescribed dosage: Furosemide 40 mg 1 tablet twice a day  Labs: 11/13/2017Creatinine 1.46, BUN 37, Potassium 4.2, Sodium 136, EGFR 44-51 11/12/2017Creatinine 1.33, BUN 30, Potassium 4.2, Sodium 137, EGFR 49-57  11/11/2017Creatinine 1.27, BUN 21, Potassium 4.1, Sodium 136, EGFR 52-60  11/10/2017Creatinine 1.48, BUN 21, Potassium 3.9, Sodium 135, EGFR 43-50  07/02/2016 Creatinine 1.69, BUN 23, Potassium 4.6, Sodium 140  06/23/2016 Creatinine 1.68, BUN 26, Potassium 4.9, Sodium 138  06/02/2016 Creatinine 1.28, BUN 27, Potassium 5.3, Sodium 139  Recommendations: None.  Follow-up plan: ICM clinic phone appointment on 11/14/2017.    Copy of ICM check sent to Dr. Rayann Heman.   3 month ICM trend: 10/13/2017    1 Year ICM trend:       Rosalene Billings, RN 10/14/2017 5:04 PM

## 2017-10-15 ENCOUNTER — Other Ambulatory Visit: Payer: Self-pay | Admitting: Interventional Cardiology

## 2017-10-26 LAB — CUP PACEART REMOTE DEVICE CHECK
Battery Remaining Longevity: 20 mo
Battery Voltage: 2.93 V
Brady Statistic AP VP Percent: 98.16 %
Brady Statistic AP VS Percent: 0.18 %
Brady Statistic AS VP Percent: 1.64 %
Brady Statistic AS VS Percent: 0.01 %
Brady Statistic RA Percent Paced: 97.52 %
Brady Statistic RV Percent Paced: 98.78 %
Date Time Interrogation Session: 20181220051802
HighPow Impedance: 52 Ohm
HighPow Impedance: 65 Ohm
Implantable Lead Implant Date: 20091215
Implantable Lead Implant Date: 20091215
Implantable Lead Implant Date: 20091215
Implantable Lead Location: 753858
Implantable Lead Location: 753859
Implantable Lead Location: 753860
Implantable Lead Model: 4196
Implantable Lead Model: 5076
Implantable Lead Model: 6947
Implantable Pulse Generator Implant Date: 20150113
Lead Channel Impedance Value: 4047 Ohm
Lead Channel Impedance Value: 456 Ohm
Lead Channel Impedance Value: 475 Ohm
Lead Channel Impedance Value: 551 Ohm
Lead Channel Impedance Value: 589 Ohm
Lead Channel Impedance Value: 988 Ohm
Lead Channel Pacing Threshold Amplitude: 0.875 V
Lead Channel Pacing Threshold Amplitude: 1 V
Lead Channel Pacing Threshold Amplitude: 1.5 V
Lead Channel Pacing Threshold Pulse Width: 0.4 ms
Lead Channel Pacing Threshold Pulse Width: 0.4 ms
Lead Channel Pacing Threshold Pulse Width: 0.6 ms
Lead Channel Sensing Intrinsic Amplitude: 1.5 mV
Lead Channel Sensing Intrinsic Amplitude: 1.5 mV
Lead Channel Sensing Intrinsic Amplitude: 15.125 mV
Lead Channel Sensing Intrinsic Amplitude: 15.125 mV
Lead Channel Setting Pacing Amplitude: 1.75 V
Lead Channel Setting Pacing Amplitude: 2 V
Lead Channel Setting Pacing Amplitude: 2.5 V
Lead Channel Setting Pacing Pulse Width: 0.6 ms
Lead Channel Setting Pacing Pulse Width: 1 ms
Lead Channel Setting Sensing Sensitivity: 0.3 mV

## 2017-10-31 DIAGNOSIS — I739 Peripheral vascular disease, unspecified: Secondary | ICD-10-CM | POA: Diagnosis not present

## 2017-10-31 DIAGNOSIS — N183 Chronic kidney disease, stage 3 (moderate): Secondary | ICD-10-CM | POA: Diagnosis not present

## 2017-10-31 DIAGNOSIS — I5022 Chronic systolic (congestive) heart failure: Secondary | ICD-10-CM | POA: Diagnosis not present

## 2017-10-31 DIAGNOSIS — M542 Cervicalgia: Secondary | ICD-10-CM | POA: Diagnosis not present

## 2017-10-31 DIAGNOSIS — I25118 Atherosclerotic heart disease of native coronary artery with other forms of angina pectoris: Secondary | ICD-10-CM | POA: Diagnosis not present

## 2017-10-31 DIAGNOSIS — I480 Paroxysmal atrial fibrillation: Secondary | ICD-10-CM | POA: Diagnosis not present

## 2017-10-31 DIAGNOSIS — E1151 Type 2 diabetes mellitus with diabetic peripheral angiopathy without gangrene: Secondary | ICD-10-CM | POA: Diagnosis not present

## 2017-10-31 DIAGNOSIS — I129 Hypertensive chronic kidney disease with stage 1 through stage 4 chronic kidney disease, or unspecified chronic kidney disease: Secondary | ICD-10-CM | POA: Diagnosis not present

## 2017-10-31 DIAGNOSIS — E1122 Type 2 diabetes mellitus with diabetic chronic kidney disease: Secondary | ICD-10-CM | POA: Diagnosis not present

## 2017-11-02 DIAGNOSIS — J441 Chronic obstructive pulmonary disease with (acute) exacerbation: Secondary | ICD-10-CM | POA: Diagnosis not present

## 2017-11-02 DIAGNOSIS — N400 Enlarged prostate without lower urinary tract symptoms: Secondary | ICD-10-CM | POA: Diagnosis not present

## 2017-11-02 DIAGNOSIS — N183 Chronic kidney disease, stage 3 (moderate): Secondary | ICD-10-CM | POA: Diagnosis not present

## 2017-11-02 DIAGNOSIS — I25118 Atherosclerotic heart disease of native coronary artery with other forms of angina pectoris: Secondary | ICD-10-CM | POA: Diagnosis not present

## 2017-11-02 DIAGNOSIS — I480 Paroxysmal atrial fibrillation: Secondary | ICD-10-CM | POA: Diagnosis not present

## 2017-11-02 DIAGNOSIS — E039 Hypothyroidism, unspecified: Secondary | ICD-10-CM | POA: Diagnosis not present

## 2017-11-02 DIAGNOSIS — I5022 Chronic systolic (congestive) heart failure: Secondary | ICD-10-CM | POA: Diagnosis not present

## 2017-11-02 DIAGNOSIS — E1122 Type 2 diabetes mellitus with diabetic chronic kidney disease: Secondary | ICD-10-CM | POA: Diagnosis not present

## 2017-11-04 DIAGNOSIS — G4733 Obstructive sleep apnea (adult) (pediatric): Secondary | ICD-10-CM | POA: Diagnosis not present

## 2017-11-04 DIAGNOSIS — R0602 Shortness of breath: Secondary | ICD-10-CM | POA: Diagnosis not present

## 2017-11-04 DIAGNOSIS — J449 Chronic obstructive pulmonary disease, unspecified: Secondary | ICD-10-CM | POA: Diagnosis not present

## 2017-11-04 DIAGNOSIS — J438 Other emphysema: Secondary | ICD-10-CM | POA: Diagnosis not present

## 2017-11-08 ENCOUNTER — Ambulatory Visit (INDEPENDENT_AMBULATORY_CARE_PROVIDER_SITE_OTHER): Payer: PPO | Admitting: *Deleted

## 2017-11-08 DIAGNOSIS — Z5181 Encounter for therapeutic drug level monitoring: Secondary | ICD-10-CM

## 2017-11-08 DIAGNOSIS — I4819 Other persistent atrial fibrillation: Secondary | ICD-10-CM

## 2017-11-08 DIAGNOSIS — I480 Paroxysmal atrial fibrillation: Secondary | ICD-10-CM

## 2017-11-08 DIAGNOSIS — Z7901 Long term (current) use of anticoagulants: Secondary | ICD-10-CM

## 2017-11-08 DIAGNOSIS — I481 Persistent atrial fibrillation: Secondary | ICD-10-CM | POA: Diagnosis not present

## 2017-11-08 LAB — POCT INR: INR: 3.6

## 2017-11-08 NOTE — Patient Instructions (Signed)
Description   Do not take any Coumadin today then continue same dose of coumadin 1 tablet everyday.  Continue eating 2-3 servings of leafy green vegetable each week.  Recheck in 3 weeks.  Call [phone] with any new medications.

## 2017-11-09 ENCOUNTER — Other Ambulatory Visit: Payer: Self-pay | Admitting: Interventional Cardiology

## 2017-11-14 ENCOUNTER — Ambulatory Visit (INDEPENDENT_AMBULATORY_CARE_PROVIDER_SITE_OTHER): Payer: PPO

## 2017-11-14 ENCOUNTER — Telehealth: Payer: Self-pay

## 2017-11-14 DIAGNOSIS — I5022 Chronic systolic (congestive) heart failure: Secondary | ICD-10-CM | POA: Diagnosis not present

## 2017-11-14 DIAGNOSIS — Z95810 Presence of automatic (implantable) cardiac defibrillator: Secondary | ICD-10-CM

## 2017-11-14 NOTE — Telephone Encounter (Signed)
Remote ICM transmission received.  Attempted call to patient and left message to return call. 

## 2017-11-14 NOTE — Progress Notes (Signed)
EPIC Encounter for ICM Monitoring  Patient Name: [name] is a 84 y.o. male Date: 11/14/2017 Primary Care Physican: [name], MD Primary [place] Electrophysiologist: Allred Dry Weight: 204lbs Bi-V Pacing: 72.7% (previously 98.8% on 10/13/17)    Clinical Status (13-Oct-2017 to 14-Nov-2017) Treated VT/VF 0 episodes  AT/AF 7 episodes  Time in AT/AF 7.6 hr/day (31.8%) Longest AT/AF 5 days Observations (3) (13-Oct-2017 to 14-Nov-2017)  AT/AF >= 6 hr for 10 days.  V. Pacing less than 90%.  Patient Activity less than 1 hr/day for 2 weeks.   Event Summary  ?VF Detection Off  ?Lead Warning  ?AT/AF Daily Burden > Threshold  ?V. Pacing < 90%  ?Low Patient Activity  ?5909 V. Sensing Episodes (was 5 V. Sensing Episodes on 10/13/17) ?10 days in AT/AF Since Last Session       Heart Failure questions reviewed, pt reported feeling a pounding in chest that comes and goes. He said it seems to happen when he is leaning over.  Explained it appears he has had some Afib and I will send copy of report to Dr Rayann Heman.     Thoracic impedance normal today but was abnormal suggesting fluid accumulation from 10/20/17 through 11/13/17 with exception of 1 day at baseline.  Time in AT/AF has increased from 0% on 10/13/17 to 31% this report.  Prescribed dosage: Furosemide 40 mg 1 tablet twice a day  Labs: 11/13/2017Creatinine 1.46, BUN 37, Potassium 4.2, Sodium 136, EGFR 44-51 11/12/2017Creatinine 1.33, BUN 30, Potassium 4.2, Sodium 137, EGFR 49-57  11/11/2017Creatinine 1.27, BUN 21, Potassium 4.1, Sodium 136, EGFR 52-60  11/10/2017Creatinine 1.48, BUN 21, Potassium 3.9, Sodium 135, EGFR 43-50  07/02/2016 Creatinine 1.69, BUN 23, Potassium 4.6, Sodium 140  06/23/2016 Creatinine 1.68, BUN 26, Potassium 4.9, Sodium 138  06/02/2016 Creatinine 1.28, BUN 27, Potassium 5.3, Sodium 139  Recommendations:   Report reviewed by triage device clinic nurse, Theodoro Doing, RN.   She will call patient today and set up an appointment with Afib Clinic  Follow-up plan: ICM clinic phone appointment on 12/15/2017.    Copy of ICM check sent to Dr. Rayann Heman and Dr Irish Lack for review regarding time in AT/AF has gone from 0% on 10/13/17 to 31% this report, drop in pacing and increase in V sensing Episodes.   3 month ICM trend: 11/14/2017    AT/AF    1 Year ICM trend:       Rosalene Billings, RN 11/14/2017 9:44 AM

## 2017-11-17 ENCOUNTER — Encounter (HOSPITAL_COMMUNITY): Payer: Self-pay | Admitting: Nurse Practitioner

## 2017-11-17 ENCOUNTER — Ambulatory Visit (HOSPITAL_COMMUNITY)
Admission: RE | Admit: 2017-11-17 | Discharge: 2017-11-17 | Disposition: A | Discharge status: Home / Self Care | Payer: PPO | Source: Ambulatory Visit | Attending: Nurse Practitioner | Admitting: Nurse Practitioner

## 2017-11-17 VITALS — BP 130/62 | HR 72 | Ht 66.0 in | Wt 210.0 lb

## 2017-11-17 DIAGNOSIS — K219 Gastro-esophageal reflux disease without esophagitis: Secondary | ICD-10-CM | POA: Diagnosis not present

## 2017-11-17 DIAGNOSIS — G4733 Obstructive sleep apnea (adult) (pediatric): Secondary | ICD-10-CM | POA: Diagnosis not present

## 2017-11-17 DIAGNOSIS — N400 Enlarged prostate without lower urinary tract symptoms: Secondary | ICD-10-CM | POA: Insufficient documentation

## 2017-11-17 DIAGNOSIS — E039 Hypothyroidism, unspecified: Secondary | ICD-10-CM | POA: Insufficient documentation

## 2017-11-17 DIAGNOSIS — J439 Emphysema, unspecified: Secondary | ICD-10-CM | POA: Diagnosis not present

## 2017-11-17 DIAGNOSIS — Z9889 Other specified postprocedural states: Secondary | ICD-10-CM | POA: Insufficient documentation

## 2017-11-17 DIAGNOSIS — Z7901 Long term (current) use of anticoagulants: Secondary | ICD-10-CM | POA: Diagnosis not present

## 2017-11-17 DIAGNOSIS — L409 Psoriasis, unspecified: Secondary | ICD-10-CM | POA: Diagnosis not present

## 2017-11-17 DIAGNOSIS — I2510 Atherosclerotic heart disease of native coronary artery without angina pectoris: Secondary | ICD-10-CM | POA: Insufficient documentation

## 2017-11-17 DIAGNOSIS — Z823 Family history of stroke: Secondary | ICD-10-CM | POA: Insufficient documentation

## 2017-11-17 DIAGNOSIS — Z95810 Presence of automatic (implantable) cardiac defibrillator: Secondary | ICD-10-CM | POA: Diagnosis not present

## 2017-11-17 DIAGNOSIS — I480 Paroxysmal atrial fibrillation: Secondary | ICD-10-CM | POA: Insufficient documentation

## 2017-11-17 DIAGNOSIS — I420 Dilated cardiomyopathy: Secondary | ICD-10-CM | POA: Diagnosis not present

## 2017-11-17 DIAGNOSIS — I255 Ischemic cardiomyopathy: Secondary | ICD-10-CM | POA: Insufficient documentation

## 2017-11-17 DIAGNOSIS — Z951 Presence of aortocoronary bypass graft: Secondary | ICD-10-CM | POA: Insufficient documentation

## 2017-11-17 DIAGNOSIS — Z8673 Personal history of transient ischemic attack (TIA), and cerebral infarction without residual deficits: Secondary | ICD-10-CM | POA: Diagnosis not present

## 2017-11-17 DIAGNOSIS — Z888 Allergy status to other drugs, medicaments and biological substances status: Secondary | ICD-10-CM | POA: Insufficient documentation

## 2017-11-17 DIAGNOSIS — Z79891 Long term (current) use of opiate analgesic: Secondary | ICD-10-CM | POA: Diagnosis not present

## 2017-11-17 DIAGNOSIS — E785 Hyperlipidemia, unspecified: Secondary | ICD-10-CM | POA: Insufficient documentation

## 2017-11-17 DIAGNOSIS — I10 Essential (primary) hypertension: Secondary | ICD-10-CM | POA: Diagnosis not present

## 2017-11-17 DIAGNOSIS — Z794 Long term (current) use of insulin: Secondary | ICD-10-CM | POA: Insufficient documentation

## 2017-11-17 DIAGNOSIS — Z8701 Personal history of pneumonia (recurrent): Secondary | ICD-10-CM | POA: Diagnosis not present

## 2017-11-17 DIAGNOSIS — Z87891 Personal history of nicotine dependence: Secondary | ICD-10-CM | POA: Insufficient documentation

## 2017-11-17 DIAGNOSIS — E1151 Type 2 diabetes mellitus with diabetic peripheral angiopathy without gangrene: Secondary | ICD-10-CM | POA: Diagnosis not present

## 2017-11-17 DIAGNOSIS — I252 Old myocardial infarction: Secondary | ICD-10-CM | POA: Insufficient documentation

## 2017-11-17 DIAGNOSIS — Z79899 Other long term (current) drug therapy: Secondary | ICD-10-CM | POA: Insufficient documentation

## 2017-11-17 DIAGNOSIS — Z825 Family history of asthma and other chronic lower respiratory diseases: Secondary | ICD-10-CM | POA: Insufficient documentation

## 2017-11-17 DIAGNOSIS — Z8249 Family history of ischemic heart disease and other diseases of the circulatory system: Secondary | ICD-10-CM | POA: Insufficient documentation

## 2017-11-17 MED ORDER — AMIODARONE HCL 400 MG PO TABS: 200.0000 mg | ORAL_TABLET | Freq: Two times a day (BID) | ORAL | 6 refills | Status: DC

## 2017-11-17 NOTE — Progress Notes (Signed)
Primary Care Physician: [name], MD Referring Physician: Dr. Sharion Settler is a 84 y.o. male with a h/o afib that recently had increase in afib burden by paceart device monitoring from 0%  to 30% and is in the afib clinic for evaluation. He is not aware that he has been in afib. He has noted that when he bends over, or puts pressure on his abdomen, as leaning over the counter to do dishes, he feels a strong heart beat  in his chest.  He has not had a change in his overall health or mediations. He did have pneumonia back in the early fall.  Today, he denies symptoms of palpitations, chest pain, shortness of breath, orthopnea, PND, lower extremity edema, dizziness, presyncope, syncope, or neurologic sequela. The patient is tolerating medications without difficulties and is otherwise without complaint today.   Past Medical History:  Diagnosis Date  . Atrial fibrillation ([place])    persistent, previously seen at [facility] and placed on amiodarone  . Benign prostatic hypertrophy   . CAD (coronary artery disease)    multivessel s/p inferolateral wall MI with subsequent CABG 11/1998.  Cath 2009 with Patent grafts  . Cleft palate   . COPD with emphysema ([place]) 04/01/2010  . DM (diabetes mellitus), type 2 ([place])   . Dyspnea   . GERD (gastroesophageal reflux disease)   . HTN (hypertension)   . Hyperlipidemia   . Hypothyroidism   . Iron deficiency anemia   . Ischemic dilated cardiomyopathy ([place])    EF 35-40% by MUGA 6/11  . Myocardial infarction ([place])   . Nasal septal deviation   . Nephrolithiasis   . OSA (obstructive sleep apnea)   . PAF (paroxysmal atrial fibrillation) ([place])   . Peripheral arterial disease ([facility])    left subclavian artery stenosis  . PNA (pneumonia)   . Psoriasis   . Seborrheic keratosis   . Stroke ([place])   . Systolic congestive heart failure ([place]) 2009   s/p BiV ICD implantation by Dr Leonia Reeves (MDT)   Past Surgical History:  Procedure Laterality Date  .  BI-VENTRICULAR IMPLANTABLE CARDIOVERTER DEFIBRILLATOR  (CRT-D)  10-08-08; 11-06-2013   Dr Leonia Reeves (MDT) implant for primary prevention; gen change to MDT VivaXT CRTD by Dr Rayann Heman  . BIV ICD GENERTAOR CHANGE OUT N/A 11/06/2013   Procedure: BIV ICD GENERTAOR CHANGE OUT;  Surgeon: Coralyn Mark, MD;  Location: [facility] CATH LAB;  Service: Cardiovascular;  Laterality: N/A;  . c-spine surgery    . CARDIOVERSION N/A 04/29/2016   Procedure: CARDIOVERSION;  Surgeon: Pixie Casino, MD;  Location[facility] ENDOSCOPY;  Service: Cardiovascular;  Laterality: N/A;  . CARPAL TUNNEL RELEASE    . CATARACT EXTRACTION    . CORONARY ARTERY BYPASS GRAFT     LIMA to LAD, SVG to OM, SVG to diagonal  . ELECTROPHYSIOLOGIC STUDY N/A 06/11/2016   Procedure: Atrial Fibrillation Ablation;  Surgeon: Thompson Grayer, MD;  Location: [facility] CV LAB;  Service: Cardiovascular;  Laterality: N/A;  . left cleft palate and left cleft lip repair      Current Outpatient Medications  Medication Sig Dispense Refill  . acetaminophen (TYLENOL) 500 MG tablet Take 1,000 mg by mouth See admin instructions. Take 2 tablets (1000 mg) by mouth daily at bedtime, may also take 2 tablets every 6 hours as needed for pain/fever    . albuterol (PROVENTIL HFA;VENTOLIN HFA) 108 (90 BASE) MCG/ACT inhaler Inhale 2 puffs into the lungs every 6 (six) hours as needed  for wheezing or shortness of breath. Must keep June 2016 appt. 1 Inhaler 0  . albuterol (PROVENTIL) (2.5 MG/3ML) 0.083% nebulizer solution USE 1 VIAL VIA NEBULIZER 3 TIMES  A DAY AS DIRECTED 270 mL 3  . amiodarone (PACERONE) 400 MG tablet Take 0.5 tablets (200 mg total) by mouth 2 (two) times daily. 30 tablet 6  . atorvastatin (LIPITOR) 80 MG tablet TAKE 1 TABLET (80 MG TOTAL) BY MOUTH DAILY. 90 tablet 2  . bisacodyl (DULCOLAX) 5 MG EC tablet Take 2 tablets (10 mg total) by mouth daily at 12 noon. 30 tablet 0  . carvedilol (COREG) 6.25 MG tablet TAKE 1 TABLET BY MOUTH TWICE A DAY 180 tablet 1  . ezetimibe  (ZETIA) 10 MG tablet Take 10 mg by mouth daily.    . ferrous sulfate 325 (65 FE) MG tablet Take 325 mg by mouth daily with breakfast.    . fluticasone (FLONASE) 50 MCG/ACT nasal spray SPRAY 2 SPRAYS INTO EACH NOSTRIL EVERY DAY 16 g 2  . fluticasone furoate-vilanterol (BREO ELLIPTA) 100-25 MCG/INH AEPB Inhale 1 puff into the lungs daily. 60 each 5  . Fluticasone-Umeclidin-Vilant (TRELEGY ELLIPTA) 100-62.5-25 MCG/INH AEPB Inhale 1 puff into the lungs daily. 30 each 5  . Fluticasone-Umeclidin-Vilant (TRELEGY ELLIPTA) 100-62.5-25 MCG/INH AEPB Inhale 1 puff into the lungs daily. 14 each 0  . furosemide (LASIX) 40 MG tablet Take 40 mg by mouth 2 (two) times daily.    . insulin aspart (NOVOLOG FLEXPEN) 100 UNIT/ML FlexPen Inject 6 Units into the skin 3 (three) times daily before meals.    . insulin degludec (TRESIBA FLEXTOUCH) 100 UNIT/ML SOPN FlexTouch Pen Inject 0.3 mLs (30 Units total) into the skin daily before lunch. 1 pen 0  . levothyroxine (SYNTHROID, LEVOTHROID) 100 MCG tablet Take 100 mcg by mouth daily before breakfast.     . Multiple Vitamin (MULTIVITAMIN WITH MINERALS) TABS tablet Take 1 tablet by mouth daily.    [name] PRESCRIPTION MEDICATION Inhale into the lungs at bedtime. CPAP    . senna (SENOKOT) 8.6 MG tablet Take 2 tablets by mouth at bedtime.     [name] SPIRIVA RESPIMAT 2.5 MCG/ACT AERS INHALE 2 PUFFS INTO THE LUNGS DAILY. 1 Inhaler 1  . spironolactone (ALDACTONE) 25 MG tablet TAKE 1/2 TABLET (12.5 MG TOTAL) BY MOUTH DAILY. 45 tablet 3  . traMADol (ULTRAM) 50 MG tablet Take 1 tablet (50 mg total) by mouth every 6 (six) hours as needed for moderate pain. 30 tablet 0  . vitamin B-12 (CYANOCOBALAMIN) 1000 MCG tablet Take 1,000 mcg by mouth daily.    [name] warfarin (COUMADIN) 5 MG tablet TAKE AS DIRECTED BY COUMADIN CLINIC 40 tablet 3   No current facility-administered medications for this encounter.     Allergies  Allergen Reactions  . Aldactone [Spironolactone] Other (See Comments)     Hyperkalemia  reported by Dr. [name] - pt is currently taking 12.5 mg daily -November 2017 per medication list by same MD    Social History   Socioeconomic History  . Marital status: Married    Spouse name: Not on file  . Number of children: Not on file  . Years of education: Not on file  . Highest education level: Not on file  Social Needs  . Financial resource strain: Not on file  . Food insecurity - worry: Not on file  . Food insecurity - inability: Not on file  . Transportation needs - medical: Not on file  . Transportation needs - non-medical: Not on  file  Occupational History  . Occupation: retired    Comment: install floord  Tobacco Use  . Smoking status: Former Smoker    Packs/day: 3.00    Years: 65.00    Pack years: 195.00    Types: Cigarettes    Last attempt to quit: 10/25/1998    Years since quitting: 19.0  . Smokeless tobacco: Never Used  Substance and Sexual Activity  . Alcohol use: No    Alcohol/week: 0.0 oz    Comment: remote history of heavy alcohol use  . Drug use: No  . Sexual activity: Not on file  Other Topics Concern  . Not on file  Social History Narrative   Lives [place]   Retired    Family History  Problem Relation Age of Onset  . Asthma Father   . Stroke Father   . Hypertension Father   . Hypertension Mother   . Heart disease Brother   . Heart attack Brother   . Hypertension Sister   . Hypertension Brother     ROS- All systems are reviewed and negative except as per the HPI above  Physical Exam: Vitals:   11/17/17 1127  BP: 130/62  Pulse: 72  Weight: 210 lb (95.3 kg)  Height: 5\' 6"  (1.676 m)   Wt Readings from Last 3 Encounters:  11/17/17 210 lb (95.3 kg)  05/12/17 212 lb 3.2 oz (96.3 kg)  05/02/17 214 lb 6.4 oz (97.3 kg)    Labs: Lab Results  Component Value Date   NA 136 09/06/2016   K 4.2 09/06/2016   CL 98 (L) 09/06/2016   CO2 29 09/06/2016   GLUCOSE 293 (H) 09/06/2016   BUN 37 (H) 09/06/2016    CREATININE 1.46 (H) 09/06/2016   CALCIUM 8.6 (L) 09/06/2016   MG 2.0 08/07/2010   Lab Results  Component Value Date   INR 3.6 11/08/2017   Lab Results  Component Value Date   CHOL 137 11/12/2014   HDL 50 11/12/2014   LDLCALC 68 11/12/2014   TRIG 95 11/12/2014     GEN- The patient is well appearing, alert and oriented x 3 today.   Head- normocephalic, atraumatic Eyes-  Sclera clear, conjunctiva pink Ears- hearing intact Oropharynx- clear Neck- supple, no JVP Lymph- no cervical lymphadenopathy Lungs- Clear to ausculation bilaterally, normal work of breathing Heart- Regular rate and rhythm, no murmurs, rubs or gallops, PMI not laterally displaced GI- soft, NT, ND, + BS Extremities- no clubbing, cyanosis, or edema MS- no significant deformity or atrophy Skin- no rash or lesion Psych- euthymic mood, full affect Neuro- strength and sensation are intact  EKG-AV dual paced rhythm, pr int 176 ms, qrs int 184 ms, qtc 543 ms Paceart report reviewed    Assessment and Plan: 1. Afib Increase by Paceart in afib burden although pt is unaware No specific trigger identified States he just had lab work with PCP and will obtain results, if TSH or liver obtained, will get on f/u  Will increase amiodarone to 200 mg bid x 2 weeks I then will see pt back and see if afib burden has diminished by interogation  Continue wafarin and I will inform coumadin clinic that amiodarone is being temporally  Increased  Butch Penny C. Milka Windholz, [facility] [address] [place], [place] [address] [phone]

## 2017-11-17 NOTE — Patient Instructions (Addendum)
Your physician has recommended you make the following change in your medication:  1)Increase amiodarone to 200mg  twice a day

## 2017-11-29 ENCOUNTER — Ambulatory Visit (HOSPITAL_COMMUNITY)
Admission: RE | Admit: 2017-11-29 | Discharge: 2017-11-29 | Disposition: A | Discharge status: Home / Self Care | Payer: PPO | Source: Ambulatory Visit | Attending: Nurse Practitioner | Admitting: Nurse Practitioner

## 2017-11-29 ENCOUNTER — Encounter (HOSPITAL_COMMUNITY): Payer: Self-pay | Admitting: Nurse Practitioner

## 2017-11-29 ENCOUNTER — Ambulatory Visit (INDEPENDENT_AMBULATORY_CARE_PROVIDER_SITE_OTHER): Payer: PPO | Admitting: *Deleted

## 2017-11-29 VITALS — BP 130/74 | HR 84 | Ht 66.0 in | Wt 210.0 lb

## 2017-11-29 DIAGNOSIS — I2510 Atherosclerotic heart disease of native coronary artery without angina pectoris: Secondary | ICD-10-CM | POA: Insufficient documentation

## 2017-11-29 DIAGNOSIS — I420 Dilated cardiomyopathy: Secondary | ICD-10-CM | POA: Insufficient documentation

## 2017-11-29 DIAGNOSIS — I4819 Other persistent atrial fibrillation: Secondary | ICD-10-CM

## 2017-11-29 DIAGNOSIS — Z951 Presence of aortocoronary bypass graft: Secondary | ICD-10-CM | POA: Insufficient documentation

## 2017-11-29 DIAGNOSIS — Z794 Long term (current) use of insulin: Secondary | ICD-10-CM | POA: Insufficient documentation

## 2017-11-29 DIAGNOSIS — Z5181 Encounter for therapeutic drug level monitoring: Secondary | ICD-10-CM | POA: Diagnosis not present

## 2017-11-29 DIAGNOSIS — I480 Paroxysmal atrial fibrillation: Secondary | ICD-10-CM | POA: Diagnosis not present

## 2017-11-29 DIAGNOSIS — I110 Hypertensive heart disease with heart failure: Secondary | ICD-10-CM | POA: Insufficient documentation

## 2017-11-29 DIAGNOSIS — I252 Old myocardial infarction: Secondary | ICD-10-CM | POA: Diagnosis not present

## 2017-11-29 DIAGNOSIS — Z7901 Long term (current) use of anticoagulants: Secondary | ICD-10-CM | POA: Diagnosis not present

## 2017-11-29 DIAGNOSIS — E1151 Type 2 diabetes mellitus with diabetic peripheral angiopathy without gangrene: Secondary | ICD-10-CM | POA: Diagnosis not present

## 2017-11-29 DIAGNOSIS — L409 Psoriasis, unspecified: Secondary | ICD-10-CM | POA: Diagnosis not present

## 2017-11-29 DIAGNOSIS — G4733 Obstructive sleep apnea (adult) (pediatric): Secondary | ICD-10-CM | POA: Insufficient documentation

## 2017-11-29 DIAGNOSIS — I481 Persistent atrial fibrillation: Secondary | ICD-10-CM | POA: Diagnosis not present

## 2017-11-29 DIAGNOSIS — Z8673 Personal history of transient ischemic attack (TIA), and cerebral infarction without residual deficits: Secondary | ICD-10-CM | POA: Diagnosis not present

## 2017-11-29 DIAGNOSIS — Z95810 Presence of automatic (implantable) cardiac defibrillator: Secondary | ICD-10-CM | POA: Insufficient documentation

## 2017-11-29 DIAGNOSIS — E785 Hyperlipidemia, unspecified: Secondary | ICD-10-CM | POA: Insufficient documentation

## 2017-11-29 DIAGNOSIS — N400 Enlarged prostate without lower urinary tract symptoms: Secondary | ICD-10-CM | POA: Insufficient documentation

## 2017-11-29 DIAGNOSIS — Z79899 Other long term (current) drug therapy: Secondary | ICD-10-CM | POA: Diagnosis not present

## 2017-11-29 DIAGNOSIS — I5022 Chronic systolic (congestive) heart failure: Secondary | ICD-10-CM | POA: Diagnosis not present

## 2017-11-29 DIAGNOSIS — I700 Atherosclerosis of aorta: Secondary | ICD-10-CM | POA: Diagnosis not present

## 2017-11-29 DIAGNOSIS — R0600 Dyspnea, unspecified: Secondary | ICD-10-CM | POA: Diagnosis not present

## 2017-11-29 DIAGNOSIS — J439 Emphysema, unspecified: Secondary | ICD-10-CM | POA: Diagnosis not present

## 2017-11-29 DIAGNOSIS — K219 Gastro-esophageal reflux disease without esophagitis: Secondary | ICD-10-CM | POA: Insufficient documentation

## 2017-11-29 DIAGNOSIS — Z87891 Personal history of nicotine dependence: Secondary | ICD-10-CM | POA: Diagnosis not present

## 2017-11-29 DIAGNOSIS — R079 Chest pain, unspecified: Secondary | ICD-10-CM | POA: Diagnosis not present

## 2017-11-29 DIAGNOSIS — E039 Hypothyroidism, unspecified: Secondary | ICD-10-CM | POA: Insufficient documentation

## 2017-11-29 DIAGNOSIS — I255 Ischemic cardiomyopathy: Secondary | ICD-10-CM | POA: Insufficient documentation

## 2017-11-29 LAB — POCT INR: INR: 3.2

## 2017-11-29 MED ORDER — AMIODARONE HCL 400 MG PO TABS: 200.0000 mg | ORAL_TABLET | Freq: Every day | ORAL | 6 refills | Status: DC

## 2017-11-29 NOTE — Patient Instructions (Signed)
Description   Do not take any Coumadin today Feb 5th then change coumadin dose to  1 tablet everyday except take 1/2 tablet on Saturdays   Continue eating 2-3 servings of leafy green vegetable each week.  Recheck in 2weeks.  Call [phone] with any new medications.

## 2017-11-29 NOTE — Progress Notes (Signed)
Primary Care Physician: [name], MD Referring Physician: Dr. Sharion Settler is a 84 y.o. male with a h/o afib that recently had increase in afib burden by paceart device monitoring from 0%  to 30% and is in the afib clinic for evaluation. He is not aware that he has been in afib. He has noted that when he bends over, or puts pressure on his abdomen, as leaning over the counter to do dishes, he feels a strong heart beat  in his chest.  He has not had a change in his overall health or mediations. He did have pneumonia back in the early fall. It was decided to increase amiodarone x 2 weeks.  Return to afib clinic 2/5. Pt has not noted any irregular heart beat. His wife states that he has has a slight congested cough and wheezing for the last couple of days. Weight is stable. Device interrogated by industry and he has been in [place] for the last 2 weeks but this am, he is in aflutter, has controlled v rates. Erlene Quan, [employer] rep, feels that the interrogation itself may have triggered flutter as it looks like it just started a few minutes ago. Feels his shortness of breath is unchanged.  Today, he denies symptoms of palpitations, chest pain, shortness of breath, orthopnea, PND, lower extremity edema, dizziness, presyncope, syncope, or neurologic sequela. The patient is tolerating medications without difficulties and is otherwise without complaint today.   Past Medical History:  Diagnosis Date  . Atrial fibrillation ([place])    persistent, previously seen at [facility] and placed on amiodarone  . Benign prostatic hypertrophy   . CAD (coronary artery disease)    multivessel s/p inferolateral wall MI with subsequent CABG 11/1998.  Cath 2009 with Patent grafts  . Cleft palate   . COPD with emphysema ([place]) 04/01/2010  . DM (diabetes mellitus), type 2 ([place])   . Dyspnea   . GERD (gastroesophageal reflux disease)   . HTN (hypertension)   . Hyperlipidemia   . Hypothyroidism   . Iron  deficiency anemia   . Ischemic dilated cardiomyopathy ([place])    EF 35-40% by MUGA 6/11  . Myocardial infarction ([place])   . Nasal septal deviation   . Nephrolithiasis   . OSA (obstructive sleep apnea)   . PAF (paroxysmal atrial fibrillation) ([place])   . Peripheral arterial disease ([facility])    left subclavian artery stenosis  . PNA (pneumonia)   . Psoriasis   . Seborrheic keratosis   . Stroke ([place])   . Systolic congestive heart failure ([place]) 2009   s/p BiV ICD implantation by Dr Leonia Reeves (MDT)   Past Surgical History:  Procedure Laterality Date  . BI-VENTRICULAR IMPLANTABLE CARDIOVERTER DEFIBRILLATOR  (CRT-D)  10-08-08; 11-06-2013   Dr Leonia Reeves (MDT) implant for primary prevention; gen change to MDT VivaXT CRTD by Dr Rayann Heman  . BIV ICD GENERTAOR CHANGE OUT N/A 11/06/2013   Procedure: BIV ICD GENERTAOR CHANGE OUT;  Surgeon: Coralyn Mark, MD;  Location: [facility] CATH LAB;  Service: Cardiovascular;  Laterality: N/A;  . c-spine surgery    . CARDIOVERSION N/A 04/29/2016   Procedure: CARDIOVERSION;  Surgeon: Pixie Casino, MD;  [facility] ENDOSCOPY;  Service: Cardiovascular;  Laterality: N/A;  . CARPAL TUNNEL RELEASE    . CATARACT EXTRACTION    . CORONARY ARTERY BYPASS GRAFT     LIMA to LAD, SVG to OM, SVG to diagonal  . ELECTROPHYSIOLOGIC STUDY N/A 06/11/2016   Procedure: Atrial Fibrillation Ablation;  Surgeon: Thompson Grayer, MD;  Location: [place] CV LAB;  Service: Cardiovascular;  Laterality: N/A;  . left cleft palate and left cleft lip repair      Current Outpatient Medications  Medication Sig Dispense Refill  . acetaminophen (TYLENOL) 500 MG tablet Take 1,000 mg by mouth See admin instructions. Take 2 tablets (1000 mg) by mouth daily at bedtime, may also take 2 tablets every 6 hours as needed for pain/fever    . albuterol (PROVENTIL HFA;VENTOLIN HFA) 108 (90 BASE) MCG/ACT inhaler Inhale 2 puffs into the lungs every 6 (six) hours as needed for wheezing or shortness of breath. Must keep June  2016 appt. 1 Inhaler 0  . albuterol (PROVENTIL) (2.5 MG/3ML) 0.083% nebulizer solution USE 1 VIAL VIA NEBULIZER 3 TIMES  A DAY AS DIRECTED 270 mL 3  . amiodarone (PACERONE) 400 MG tablet Take 0.5 tablets (200 mg total) by mouth daily. 30 tablet 6  . atorvastatin (LIPITOR) 80 MG tablet TAKE 1 TABLET (80 MG TOTAL) BY MOUTH DAILY. 90 tablet 2  . bisacodyl (DULCOLAX) 5 MG EC tablet Take 2 tablets (10 mg total) by mouth daily at 12 noon. 30 tablet 0  . carvedilol (COREG) 6.25 MG tablet TAKE 1 TABLET BY MOUTH TWICE A DAY 180 tablet 1  . ezetimibe (ZETIA) 10 MG tablet Take 10 mg by mouth daily.    . ferrous sulfate 325 (65 FE) MG tablet Take 325 mg by mouth daily with breakfast.    . fluticasone (FLONASE) 50 MCG/ACT nasal spray SPRAY 2 SPRAYS INTO EACH NOSTRIL EVERY DAY 16 g 2  . fluticasone furoate-vilanterol (BREO ELLIPTA) 100-25 MCG/INH AEPB Inhale 1 puff into the lungs daily. 60 each 5  . Fluticasone-Umeclidin-Vilant (TRELEGY ELLIPTA) 100-62.5-25 MCG/INH AEPB Inhale 1 puff into the lungs daily. 30 each 5  . Fluticasone-Umeclidin-Vilant (TRELEGY ELLIPTA) 100-62.5-25 MCG/INH AEPB Inhale 1 puff into the lungs daily. 14 each 0  . furosemide (LASIX) 40 MG tablet Take 40 mg by mouth 2 (two) times daily.    . insulin aspart (NOVOLOG FLEXPEN) 100 UNIT/ML FlexPen Inject 6 Units into the skin 3 (three) times daily before meals.    . insulin degludec (TRESIBA FLEXTOUCH) 100 UNIT/ML SOPN FlexTouch Pen Inject 0.3 mLs (30 Units total) into the skin daily before lunch. 1 pen 0  . levothyroxine (SYNTHROID, LEVOTHROID) 100 MCG tablet Take 100 mcg by mouth daily before breakfast.     . Multiple Vitamin (MULTIVITAMIN WITH MINERALS) TABS tablet Take 1 tablet by mouth daily.    [name] PRESCRIPTION MEDICATION Inhale into the lungs at bedtime. CPAP    . senna (SENOKOT) 8.6 MG tablet Take 2 tablets by mouth at bedtime.     [name] SPIRIVA RESPIMAT 2.5 MCG/ACT AERS INHALE 2 PUFFS INTO THE LUNGS DAILY. 1 Inhaler 1  . spironolactone  (ALDACTONE) 25 MG tablet TAKE 1/2 TABLET (12.5 MG TOTAL) BY MOUTH DAILY. 45 tablet 3  . traMADol (ULTRAM) 50 MG tablet Take 1 tablet (50 mg total) by mouth every 6 (six) hours as needed for moderate pain. 30 tablet 0  . vitamin B-12 (CYANOCOBALAMIN) 1000 MCG tablet Take 1,000 mcg by mouth daily.    [name] warfarin (COUMADIN) 5 MG tablet TAKE AS DIRECTED BY COUMADIN CLINIC 40 tablet 3   No current facility-administered medications for this encounter.     Allergies  Allergen Reactions  . Aldactone [Spironolactone] Other (See Comments)    Hyperkalemia  reported by Dr. [name] - pt is currently taking 12.5 mg daily -November 2017  per medication list by same MD    Social History   Socioeconomic History  . Marital status: Married    Spouse name: Not on file  . Number of children: Not on file  . Years of education: Not on file  . Highest education level: Not on file  Social Needs  . Financial resource strain: Not on file  . Food insecurity - worry: Not on file  . Food insecurity - inability: Not on file  . Transportation needs - medical: Not on file  . Transportation needs - non-medical: Not on file  Occupational History  . Occupation: retired    Comment: install floord  Tobacco Use  . Smoking status: Former Smoker    Packs/day: 3.00    Years: 65.00    Pack years: 195.00    Types: Cigarettes    Last attempt to quit: 10/25/1998    Years since quitting: 19.1  . Smokeless tobacco: Never Used  Substance and Sexual Activity  . Alcohol use: No    Alcohol/week: 0.0 oz    Comment: remote history of heavy alcohol use  . Drug use: No  . Sexual activity: Not on file  Other Topics Concern  . Not on file  Social History Narrative   Lives [place]   Retired    Family History  Problem Relation Age of Onset  . Asthma Father   . Stroke Father   . Hypertension Father   . Hypertension Mother   . Heart disease Brother   . Heart attack Brother   . Hypertension Sister   .  Hypertension Brother     ROS- All systems are reviewed and negative except as per the HPI above  Physical Exam: Vitals:   11/29/17 0851  BP: 130/74  Pulse: 84  SpO2: 98%  Weight: 210 lb (95.3 kg)  Height: 5\' 6"  (1.676 m)   Wt Readings from Last 3 Encounters:  11/29/17 210 lb (95.3 kg)  11/17/17 210 lb (95.3 kg)  05/12/17 212 lb 3.2 oz (96.3 kg)    Labs: Lab Results  Component Value Date   NA 136 09/06/2016   K 4.2 09/06/2016   CL 98 (L) 09/06/2016   CO2 29 09/06/2016   GLUCOSE 293 (H) 09/06/2016   BUN 37 (H) 09/06/2016   CREATININE 1.46 (H) 09/06/2016   CALCIUM 8.6 (L) 09/06/2016   MG 2.0 08/07/2010   Lab Results  Component Value Date   INR 3.2 11/29/2017   Lab Results  Component Value Date   CHOL 137 11/12/2014   HDL 50 11/12/2014   LDLCALC 68 11/12/2014   TRIG 95 11/12/2014     GEN- The patient is well appearing, alert and oriented x 3 today.   Head- normocephalic, atraumatic Eyes-  Sclera clear, conjunctiva pink Ears- hearing intact Oropharynx- clear Neck- supple, no JVP Lymph- no cervical lymphadenopathy Lungs- few rhonchi heard rt base, left clear, normal work of breathing Heart- Regular rate and rhythm, no murmurs, rubs or gallops, PMI not laterally displaced GI- soft, NT, ND, + BS Extremities- no clubbing, cyanosis, or edema MS- no significant deformity or atrophy Skin- no rash or lesion Psych- euthymic mood, full affect Neuro- strength and sensation are intact  EKG- V paced rhythm,  qrs int 184 ms, qtc 583 ms Device interrogated by industry and found afb furden imporved to leass thatn 5% and seems to have been in SR for the last two weeks until this am when a flutter with controlled v rates  Assessment and Plan: 1. Afib Burden improved with increase of amiodarone, but will decrease back to one a day as I am concerned re his respiratory issues of cough /wheezing, rhonchi heard rt base No specific trigger identified for increase in  burden Chest xray to be done today [place] warfarin for a chadsvasc score of at least 6 Next f/u at coumadin clinic 2/19   F/u with Chanetta Marshall, NP in one month, will need TSH/liver labs done then afib clinic as needed   Butch Penny C. Joellen Tullos, [facility] [address] [place], [place] [address] [phone]

## 2017-11-29 NOTE — Patient Instructions (Signed)
Decrease Amiodarone to 200mg  once a day  Scheduler will be in touch with you for appointment with [name] in 1 month.

## 2017-12-08 ENCOUNTER — Other Ambulatory Visit: Payer: Self-pay | Admitting: Interventional Cardiology

## 2017-12-11 ENCOUNTER — Other Ambulatory Visit: Payer: Self-pay | Admitting: Interventional Cardiology

## 2017-12-12 DIAGNOSIS — E039 Hypothyroidism, unspecified: Secondary | ICD-10-CM | POA: Diagnosis not present

## 2017-12-12 DIAGNOSIS — J449 Chronic obstructive pulmonary disease, unspecified: Secondary | ICD-10-CM | POA: Diagnosis not present

## 2017-12-12 DIAGNOSIS — I480 Paroxysmal atrial fibrillation: Secondary | ICD-10-CM | POA: Diagnosis not present

## 2017-12-12 DIAGNOSIS — I25118 Atherosclerotic heart disease of native coronary artery with other forms of angina pectoris: Secondary | ICD-10-CM | POA: Diagnosis not present

## 2017-12-13 ENCOUNTER — Ambulatory Visit (INDEPENDENT_AMBULATORY_CARE_PROVIDER_SITE_OTHER): Payer: PPO | Admitting: *Deleted

## 2017-12-13 DIAGNOSIS — I480 Paroxysmal atrial fibrillation: Secondary | ICD-10-CM

## 2017-12-13 DIAGNOSIS — Z5181 Encounter for therapeutic drug level monitoring: Secondary | ICD-10-CM

## 2017-12-13 DIAGNOSIS — I4819 Other persistent atrial fibrillation: Secondary | ICD-10-CM

## 2017-12-13 DIAGNOSIS — I481 Persistent atrial fibrillation: Secondary | ICD-10-CM | POA: Diagnosis not present

## 2017-12-13 DIAGNOSIS — Z7901 Long term (current) use of anticoagulants: Secondary | ICD-10-CM

## 2017-12-13 LAB — POCT INR: INR: 2.6

## 2017-12-13 NOTE — Patient Instructions (Signed)
Description   Continue same dose of  coumadin   1 tablet everyday except take 1/2 tablet on Saturdays   Continue eating 2-3 servings of leafy green vegetable each week.  Recheck in 3 weeks.  Call [phone] with any new medications.

## 2017-12-14 ENCOUNTER — Other Ambulatory Visit: Payer: Self-pay | Admitting: Pulmonary Disease

## 2017-12-15 ENCOUNTER — Ambulatory Visit (INDEPENDENT_AMBULATORY_CARE_PROVIDER_SITE_OTHER): Payer: PPO

## 2017-12-15 DIAGNOSIS — Z95810 Presence of automatic (implantable) cardiac defibrillator: Secondary | ICD-10-CM | POA: Diagnosis not present

## 2017-12-15 DIAGNOSIS — I5022 Chronic systolic (congestive) heart failure: Secondary | ICD-10-CM | POA: Diagnosis not present

## 2017-12-16 NOTE — Progress Notes (Signed)
EPIC Encounter for ICM Monitoring  Patient Name: [name] is a 84 y.o. male Date: 12/16/2017 Primary Care Physican: [name], MD Primary Circle Electrophysiologist: Allred Dry Weight:Previous weight 204lbs Bi-V Pacing: 99.7%      Spoke with wife.  Heart Failure questions reviewed, pt asymptomatic.   Thoracic impedance just below baseline suggesting fluid accumulation since 11/24/2017 but trending up.  Prescribed dosage: Furosemide 40 mg 1 tablet twice a day  Labs: 11/13/2017Creatinine 1.46, BUN 37, Potassium 4.2, Sodium 136, EGFR 44-51 11/12/2017Creatinine 1.33, BUN 30, Potassium 4.2, Sodium 137, EGFR 49-57  11/11/2017Creatinine 1.27, BUN 21, Potassium 4.1, Sodium 136, EGFR 52-60  11/10/2017Creatinine 1.48, BUN 21, Potassium 3.9, Sodium 135, EGFR 43-50  07/02/2016 Creatinine 1.69, BUN 23, Potassium 4.6, Sodium 140  06/23/2016 Creatinine 1.68, BUN 26, Potassium 4.9, Sodium 138  06/02/2016 Creatinine 1.28, BUN 27, Potassium 5.3, Sodium 139  Recommendations: No changes.   Encouraged to call for fluid symptoms.  Follow-up plan: ICM clinic phone appointment on 01/30/2018.  Office appointment scheduled 12/30/2017 with Chanetta Marshall, NP.  Copy of ICM check sent to Dr. Rayann Heman and [name].   3 month ICM trend: 12/15/2017    1 Year ICM trend:       Rosalene Billings, RN 12/16/2017 12:45 PM

## 2017-12-29 NOTE — Progress Notes (Signed)
Electrophysiology Office Note Date: 12/30/2017  ID:  [name], DOB Nov 30, 1933, MRN [id number]  PCP: [name], MD Primary Cardiologist: Irish Lack Electrophysiologist: Allred   CC: ICD/AF follow up  [name] is a 84 y.o. male seen today for Dr Rayann Heman.  He presents today for routine electrophysiology followup.  Since last being seen in our clinic, the patient reports doing reasonably well. He got a new CPAP mask and has not been able to tolerate it. He is planning to follow up with pulmonary. He also came off several of his pulmonary meds because he thought they weren't helping but has had worsening shortness of breath since.  He is restarting them slowly.  He has also noticed occasional diaphragmatic stim.   He has been seen recently by the AF clinic for increased AF burden. He has been maintained on amiodarone.  He denies chest pain, palpitations, dizziness, syncope, edema, weight gain, or early satiety.  He has not had ICD shocks.    Device History: MDT CRTD implanted 2009 for ICM, CHF History of appropriate therapy: No History of AAD therapy: yes - amiodarone for AF    Past Medical History:  Diagnosis Date  . Atrial fibrillation ([place])    persistent, previously seen at [facility] and placed on amiodarone  . Benign prostatic hypertrophy   . CAD (coronary artery disease)    multivessel s/p inferolateral wall MI with subsequent CABG 11/1998.  Cath 2009 with Patent grafts  . Cleft palate   . COPD with emphysema ([place]) 04/01/2010  . DM (diabetes mellitus), type 2 ([place])   . Dyspnea   . GERD (gastroesophageal reflux disease)   . HTN (hypertension)   . Hyperlipidemia   . Hypothyroidism   . Iron deficiency anemia   . Ischemic dilated cardiomyopathy ([place])    EF 35-40% by MUGA 6/11  . Myocardial infarction ([place])   . Nasal septal deviation   . Nephrolithiasis   . OSA (obstructive sleep apnea)   . PAF (paroxysmal atrial fibrillation) ([place])   . Peripheral arterial disease  (H[facility])    left subclavian artery stenosis  . PNA (pneumonia)   . Psoriasis   . Seborrheic keratosis   . Stroke ([place])   . Systolic congestive heart failure ([place]) 2009   s/p BiV ICD implantation by Dr Leonia Reeves (MDT)   Past Surgical History:  Procedure Laterality Date  . BI-VENTRICULAR IMPLANTABLE CARDIOVERTER DEFIBRILLATOR  (CRT-D)  10-08-08; 11-06-2013   Dr Leonia Reeves (MDT) implant for primary prevention; gen change to MDT VivaXT CRTD by Dr Rayann Heman  . BIV ICD GENERTAOR CHANGE OUT N/A 11/06/2013   Procedure: BIV ICD GENERTAOR CHANGE OUT;  Surgeon: Coralyn Mark, MD;  Location: [facility] CATH LAB;  Service: Cardiovascular;  Laterality: N/A;  . c-spine surgery    . CARDIOVERSION N/A 04/29/2016   Procedure: CARDIOVERSION;  Surgeon: Pixie Casino, MD;  Location: [facility] ENDOSCOPY;  Service: Cardiovascular;  Laterality: N/A;  . CARPAL TUNNEL RELEASE    . CATARACT EXTRACTION    . CORONARY ARTERY BYPASS GRAFT     LIMA to LAD, SVG to OM, SVG to diagonal  . ELECTROPHYSIOLOGIC STUDY N/A 06/11/2016   Procedure: Atrial Fibrillation Ablation;  Surgeon: Thompson Grayer, MD;  Location: [place] CV LAB;  Service: Cardiovascular;  Laterality: N/A;  . left cleft palate and left cleft lip repair      Current Outpatient Medications  Medication Sig Dispense Refill  . acetaminophen (TYLENOL) 500 MG tablet Take 1,000 mg by mouth See admin  instructions. Take 2 tablets (1000 mg) by mouth daily at bedtime, may also take 2 tablets every 6 hours as needed for pain/fever    . albuterol (PROVENTIL HFA;VENTOLIN HFA) 108 (90 BASE) MCG/ACT inhaler Inhale 2 puffs into the lungs every 6 (six) hours as needed for wheezing or shortness of breath. Must keep June 2016 appt. 1 Inhaler 0  . albuterol (PROVENTIL) (2.5 MG/3ML) 0.083% nebulizer solution USE 1 VIAL VIA NEBULIZER 3 TIMES A DAY AS DIRECTED. DX:J44.9 300 mL 3  . amiodarone (PACERONE) 400 MG tablet Take 0.5 tablets (200 mg total) by mouth daily. 30 tablet 6  . atorvastatin (LIPITOR) 80  MG tablet TAKE 1 TABLET (80 MG TOTAL) BY MOUTH DAILY. 90 tablet 2  . bisacodyl (DULCOLAX) 5 MG EC tablet Take 2 tablets (10 mg total) by mouth daily at 12 noon. 30 tablet 0  . carvedilol (COREG) 6.25 MG tablet TAKE 1 TABLET BY MOUTH TWICE A DAY 180 tablet 1  . ezetimibe (ZETIA) 10 MG tablet Take 10 mg by mouth daily.    . ferrous sulfate 325 (65 FE) MG tablet Take 325 mg by mouth daily with breakfast.    . fluticasone (FLONASE) 50 MCG/ACT nasal spray SPRAY 2 SPRAYS INTO EACH NOSTRIL EVERY DAY 16 g 2  . fluticasone furoate-vilanterol (BREO ELLIPTA) 100-25 MCG/INH AEPB Inhale 1 puff into the lungs daily. 60 each 5  . Fluticasone-Umeclidin-Vilant (TRELEGY ELLIPTA) 100-62.5-25 MCG/INH AEPB Inhale 1 puff into the lungs daily. 30 each 5  . furosemide (LASIX) 40 MG tablet Take 40 mg by mouth 2 (two) times daily.    . insulin aspart (NOVOLOG FLEXPEN) 100 UNIT/ML FlexPen Inject 6 Units into the skin 3 (three) times daily before meals.    . insulin degludec (TRESIBA FLEXTOUCH) 100 UNIT/ML SOPN FlexTouch Pen Inject 0.3 mLs (30 Units total) into the skin daily before lunch. 1 pen 0  . levothyroxine (SYNTHROID, LEVOTHROID) 100 MCG tablet Take 100 mcg by mouth daily before breakfast.     . Multiple Vitamin (MULTIVITAMIN WITH MINERALS) TABS tablet Take 1 tablet by mouth daily.    [name] PRESCRIPTION MEDICATION Inhale into the lungs at bedtime. CPAP    . senna (SENOKOT) 8.6 MG tablet Take 2 tablets by mouth at bedtime.     [name] SPIRIVA RESPIMAT 2.5 MCG/ACT AERS INHALE 2 PUFFS INTO THE LUNGS DAILY. 1 Inhaler 1  . spironolactone (ALDACTONE) 25 MG tablet TAKE 1/2 TABLET (12.5 MG TOTAL) BY MOUTH DAILY. 45 tablet 1  . traMADol (ULTRAM) 50 MG tablet Take 1 tablet (50 mg total) by mouth every 6 (six) hours as needed for moderate pain. 30 tablet 0  . vitamin B-12 (CYANOCOBALAMIN) 1000 MCG tablet Take 1,000 mcg by mouth daily.    [name] warfarin (COUMADIN) 5 MG tablet TAKE AS DIRECTED BY COUMADIN CLINIC 40 tablet 3   No current  facility-administered medications for this visit.     Allergies:   Aldactone [spironolactone]   Social History: Social History   Socioeconomic History  . Marital status: Married    Spouse name: Not on file  . Number of children: Not on file  . Years of education: Not on file  . Highest education level: Not on file  Social Needs  . Financial resource strain: Not on file  . Food insecurity - worry: Not on file  . Food insecurity - inability: Not on file  . Transportation needs - medical: Not on file  . Transportation needs - non-medical: Not on file  Occupational History  .  Occupation: retired    Comment: install floord  Tobacco Use  . Smoking status: Former Smoker    Packs/day: 3.00    Years: 65.00    Pack years: 195.00    Types: Cigarettes    Last attempt to quit: 10/25/1998    Years since quitting: 19.1  . Smokeless tobacco: Never Used  Substance and Sexual Activity  . Alcohol use: No    Alcohol/week: 0.0 oz    Comment: remote history of heavy alcohol use  . Drug use: No  . Sexual activity: Not on file  Other Topics Concern  . Not on file  Social History Narrative   Lives [place]   Retired    Family History: Family History  Problem Relation Age of Onset  . Asthma Father   . Stroke Father   . Hypertension Father   . Hypertension Mother   . Heart disease Brother   . Heart attack Brother   . Hypertension Sister   . Hypertension Brother     Review of Systems: All other systems reviewed and are otherwise negative except as noted above.   Physical Exam: VS:  BP 132/66   Pulse 84   Ht 5\' 6"  (1.676 m)   Wt 212 lb (96.2 kg)   SpO2 94%   BMI 34.22 kg/m  , BMI Body mass index is 34.22 kg/m.  GEN- The patient is chronically ill and obese appearing, alert and oriented x 3 today.   HEENT: normocephalic, atraumatic; sclera clear, conjunctiva pink; hearing intact; oropharynx clear; neck supple  Lungs- normal work of breathing, scattered expiratory  wheezing Heart- Regular rate and rhythm (paced) GI- soft, non-tender, non-distended, bowel sounds present  Extremities- no clubbing, cyanosis, or edema; DP/PT/radial pulses 2+ bilaterally MS- no significant deformity or atrophy Skin- warm and dry, no rash or lesion; ICD pocket well healed Psych- euthymic mood, full affect Neuro- strength and sensation are intact  ICD interrogation- reviewed in detail today,  See PACEART report  EKG:  EKG is not ordered today.  Recent Labs: 05/02/2017: TSH 4.200   Wt Readings from Last 3 Encounters:  12/30/17 212 lb (96.2 kg)  11/29/17 210 lb (95.3 kg)  11/17/17 210 lb (95.3 kg)     Other studies Reviewed: Additional studies/ records that were reviewed today include: Dr Irish Lack and Dr Juanetta Gosling office notes  Assessment and Plan:  1.  Persistent atrial fibrillation Burden by device interrogation 0% Continue amiodarone 200mg  daily. TSH, FT4, LFT's today  Continue Warfarin for CHADS2VASC of 8   2. Chronic systolic dysfunction Euvolemic on exam Stable on an appropriate medical regimen His RV lead is known to be fractured. Still with stable impedence, sensing, threshold RV tip to RV coil. Impedence bipolar >3000ohms.  Tachy therapies have been disabled. He is not device dependent. Will continue to follow for now.  LV output lowered to 0.5V safety margin - threshold stable.  No changes today BMET today   3.  OSA CPAP compliance encouraged - he is planning on following up with pulmonary to re-evaluate mask  4.  CAD No recent ischemic symptoms  5.  HTN Stable No change required today  6.  Obesity Weight loss encouraged Body mass index is 34.22 kg/m.   Current medicines are reviewed at length with the patient today.   The patient does not have concerns regarding his medicines.  The following changes were made today:  none  Labs/ tests ordered today include: none   Disposition:   Follow up with [facility],  me in 6 months, Dr Irish Lack as  scheduled   Signed, Chanetta Marshall, NP  12/30/2017 9:14 AM  [address] [place] [address] [phone] (office) [phone] (fax)

## 2017-12-30 ENCOUNTER — Encounter: Payer: Self-pay | Admitting: Nurse Practitioner

## 2017-12-30 ENCOUNTER — Ambulatory Visit (INDEPENDENT_AMBULATORY_CARE_PROVIDER_SITE_OTHER): Payer: PPO | Admitting: Nurse Practitioner

## 2017-12-30 ENCOUNTER — Ambulatory Visit (INDEPENDENT_AMBULATORY_CARE_PROVIDER_SITE_OTHER): Payer: PPO | Admitting: Pharmacist

## 2017-12-30 VITALS — BP 132/66 | HR 84 | Ht 66.0 in | Wt 212.0 lb

## 2017-12-30 DIAGNOSIS — G4733 Obstructive sleep apnea (adult) (pediatric): Secondary | ICD-10-CM

## 2017-12-30 DIAGNOSIS — I481 Persistent atrial fibrillation: Secondary | ICD-10-CM | POA: Diagnosis not present

## 2017-12-30 DIAGNOSIS — Z7901 Long term (current) use of anticoagulants: Secondary | ICD-10-CM | POA: Diagnosis not present

## 2017-12-30 DIAGNOSIS — Z9989 Dependence on other enabling machines and devices: Secondary | ICD-10-CM | POA: Diagnosis not present

## 2017-12-30 DIAGNOSIS — I5022 Chronic systolic (congestive) heart failure: Secondary | ICD-10-CM

## 2017-12-30 DIAGNOSIS — Z5181 Encounter for therapeutic drug level monitoring: Secondary | ICD-10-CM

## 2017-12-30 DIAGNOSIS — I4819 Other persistent atrial fibrillation: Secondary | ICD-10-CM

## 2017-12-30 DIAGNOSIS — I2510 Atherosclerotic heart disease of native coronary artery without angina pectoris: Secondary | ICD-10-CM

## 2017-12-30 DIAGNOSIS — I480 Paroxysmal atrial fibrillation: Secondary | ICD-10-CM

## 2017-12-30 LAB — CUP PACEART INCLINIC DEVICE CHECK
Date Time Interrogation Session: 20190308084956
Implantable Lead Implant Date: 20091215
Implantable Lead Implant Date: 20091215
Implantable Lead Implant Date: 20091215
Implantable Lead Location: 753858
Implantable Lead Location: 753859
Implantable Lead Location: 753860
Implantable Lead Model: 4196
Implantable Lead Model: 5076
Implantable Lead Model: 6947
Implantable Pulse Generator Implant Date: 20150113

## 2017-12-30 LAB — HEPATIC FUNCTION PANEL
ALT: 10 IU/L (ref 0–44)
AST: 19 IU/L (ref 0–40)
Albumin: 3.9 g/dL (ref 3.5–4.7)
Alkaline Phosphatase: 77 IU/L (ref 39–117)
Bilirubin Total: 0.4 mg/dL (ref 0.0–1.2)
Bilirubin, Direct: 0.15 mg/dL (ref 0.00–0.40)
Total Protein: 6.5 g/dL (ref 6.0–8.5)

## 2017-12-30 LAB — BASIC METABOLIC PANEL
BUN/Creatinine Ratio: 12 (ref 10–24)
BUN: 15 mg/dL (ref 8–27)
CO2: 29 mmol/L (ref 20–29)
Calcium: 9.1 mg/dL (ref 8.6–10.2)
Chloride: 98 mmol/L (ref 96–106)
Creatinine, Ser: 1.24 mg/dL (ref 0.76–1.27)
GFR calc Af Amer: 62 mL/min/{1.73_m2} (ref >59)
GFR calc non Af Amer: 54 mL/min/{1.73_m2} — ABNORMAL LOW (ref >59)
Glucose: 174 mg/dL — ABNORMAL HIGH (ref 65–99)
Potassium: 4.6 mmol/L (ref 3.5–5.2)
Sodium: 139 mmol/L (ref 134–144)

## 2017-12-30 LAB — POCT INR: INR: 2.6

## 2017-12-30 LAB — TSH: TSH: 5.19 u[IU]/mL — ABNORMAL HIGH (ref 0.450–4.500)

## 2017-12-30 LAB — T4, FREE: Free T4: 1.66 ng/dL (ref 0.82–1.77)

## 2017-12-30 LAB — T3, FREE: T3, Free: 2.2 pg/mL (ref 2.0–4.4)

## 2017-12-30 NOTE — Patient Instructions (Addendum)
Medication Instructions:   Your physician recommends that you continue on your current medications as directed. Please refer to the Current Medication list given to you today.   If you need a refill on your cardiac medications before your next appointment, please call your pharmacy.  Labwork:  BMET LFT TSH T3 AN T4     Testing/Procedures:  NONE ORDERED  TODAY    Follow-Up:   Your physician wants you to follow-up in:  IN  [place] .You will receive a reminder letter in the mail two months in advance. If you don't receive a letter, please call our office to schedule the follow-up appointment.    Remote monitoring is used to monitor your Pacemaker of ICD from home. This monitoring reduces the number of office visits required to check your device to one time per year. It allows [place] to keep an eye on the functioning of your device to ensure it is working properly. You are scheduled for a device check from home on . 01-12-18..You may send your transmission at any time that day. If you have a wireless device, the transmission will be sent automatically. After your physician reviews your transmission, you will receive a postcard with your next transmission date.     Any Other Special Instructions Will Be Listed Below (If Applicable).                                                                                                                                                  '

## 2017-12-30 NOTE — Patient Instructions (Signed)
Description   Continue same dose of  coumadin   1 tablet everyday except take 1/2 tablet on Saturdays   Continue eating 2-3 servings of leafy green vegetable each week.  Recheck in 4 weeks.  Call [phone] with any new medications.

## 2018-01-12 ENCOUNTER — Ambulatory Visit (INDEPENDENT_AMBULATORY_CARE_PROVIDER_SITE_OTHER): Payer: PPO | Admitting: *Deleted

## 2018-01-12 DIAGNOSIS — I255 Ischemic cardiomyopathy: Secondary | ICD-10-CM

## 2018-01-12 DIAGNOSIS — I420 Dilated cardiomyopathy: Secondary | ICD-10-CM | POA: Diagnosis not present

## 2018-01-12 NOTE — Progress Notes (Signed)
Remote ICD transmission.   

## 2018-01-13 ENCOUNTER — Encounter: Payer: Self-pay | Admitting: Cardiology

## 2018-01-30 ENCOUNTER — Telehealth: Payer: Self-pay

## 2018-01-30 ENCOUNTER — Ambulatory Visit (INDEPENDENT_AMBULATORY_CARE_PROVIDER_SITE_OTHER): Payer: PPO | Admitting: Pharmacist

## 2018-01-30 ENCOUNTER — Ambulatory Visit (INDEPENDENT_AMBULATORY_CARE_PROVIDER_SITE_OTHER): Payer: PPO

## 2018-01-30 DIAGNOSIS — I480 Paroxysmal atrial fibrillation: Secondary | ICD-10-CM | POA: Diagnosis not present

## 2018-01-30 DIAGNOSIS — Z95810 Presence of automatic (implantable) cardiac defibrillator: Secondary | ICD-10-CM

## 2018-01-30 DIAGNOSIS — Z5181 Encounter for therapeutic drug level monitoring: Secondary | ICD-10-CM

## 2018-01-30 DIAGNOSIS — Z7901 Long term (current) use of anticoagulants: Secondary | ICD-10-CM | POA: Diagnosis not present

## 2018-01-30 DIAGNOSIS — I481 Persistent atrial fibrillation: Secondary | ICD-10-CM | POA: Diagnosis not present

## 2018-01-30 DIAGNOSIS — I4819 Other persistent atrial fibrillation: Secondary | ICD-10-CM

## 2018-01-30 DIAGNOSIS — I5022 Chronic systolic (congestive) heart failure: Secondary | ICD-10-CM

## 2018-01-30 LAB — POCT INR: INR: 2.8

## 2018-01-30 NOTE — Progress Notes (Signed)
EPIC Encounter for ICM Monitoring  Patient Name: [name] is a 84 y.o. male Date: 01/30/2018 Primary Care Physican: [name], MD Primary [place] Electrophysiologist: Allred Dry Weight:Previous weight 204lbs Bi-V Pacing:99.6%          Attempted call to wife/patient and unable to reach.  Left detailed message regarding transmission.  Transmission reviewed.    Thoracic impedance abnormal suggesting fluid accumulation starting 01/16/2018 but almost at baseline today.  Prescribed dosage: Furosemide 40 mg 1 tablet twice a day  Labs: 11/13/2017Creatinine 1.46, BUN 37, Potassium 4.2, Sodium 136, EGFR 44-51 11/12/2017Creatinine 1.33, BUN 30, Potassium 4.2, Sodium 137, EGFR 49-57  11/11/2017Creatinine 1.27, BUN 21, Potassium 4.1, Sodium 136, EGFR 52-60  11/10/2017Creatinine 1.48, BUN 21, Potassium 3.9, Sodium 135, EGFR 43-50  07/02/2016 Creatinine 1.69, BUN 23, Potassium 4.6, Sodium 140  06/23/2016 Creatinine 1.68, BUN 26, Potassium 4.9, Sodium 138  06/02/2016 Creatinine 1.28, BUN 27, Potassium 5.3, Sodium 139  Recommendations: Left voice mail with ICM number and encouraged to call if experiencing any fluid symptoms.  Follow-up plan: ICM clinic phone appointment on 03/02/2018.    Copy of ICM check sent to Dr. Rayann Heman.   3 month ICM trend: 01/30/2018    1 Year ICM trend:       Rosalene Billings, RN 01/30/2018 11:32 AM

## 2018-01-30 NOTE — Telephone Encounter (Signed)
Remote ICM transmission received.  Attempted call to patient/wife and left detailed message per DPR regarding transmission and next ICM scheduled for 03/02/2018.  Advised to return call for any fluid symptoms or questions.

## 2018-01-30 NOTE — Patient Instructions (Signed)
Continue same dose of  coumadin   1 tablet everyday except take 1/2 tablet on Saturdays   Continue eating 2-3 servings of leafy green vegetable each week.  Recheck in 4 weeks.  Call [phone] with any new medications.

## 2018-01-31 ENCOUNTER — Ambulatory Visit
Admission: RE | Admit: 2018-01-31 | Discharge: 2018-01-31 | Disposition: A | Discharge status: Home / Self Care | Payer: PPO | Source: Ambulatory Visit | Attending: Internal Medicine | Admitting: Internal Medicine

## 2018-01-31 ENCOUNTER — Other Ambulatory Visit: Payer: Self-pay | Admitting: Internal Medicine

## 2018-01-31 DIAGNOSIS — W19XXXA Unspecified fall, initial encounter: Secondary | ICD-10-CM | POA: Diagnosis not present

## 2018-01-31 DIAGNOSIS — S299XXA Unspecified injury of thorax, initial encounter: Secondary | ICD-10-CM | POA: Diagnosis not present

## 2018-01-31 DIAGNOSIS — R0781 Pleurodynia: Secondary | ICD-10-CM

## 2018-02-03 LAB — CUP PACEART REMOTE DEVICE CHECK
Battery Remaining Longevity: 17 mo
Battery Voltage: 2.92 V
Brady Statistic AP VP Percent: 98.76 %
Brady Statistic AP VS Percent: 0.08 %
Brady Statistic AS VP Percent: 1.15 %
Brady Statistic AS VS Percent: 0.01 %
Brady Statistic RA Percent Paced: 98.69 %
Brady Statistic RV Percent Paced: 99.7 %
Date Time Interrogation Session: 20190321041603
HighPow Impedance: 48 Ohm
HighPow Impedance: 62 Ohm
Implantable Lead Implant Date: 20091215
Implantable Lead Implant Date: 20091215
Implantable Lead Implant Date: 20091215
Implantable Lead Location: 753858
Implantable Lead Location: 753859
Implantable Lead Location: 753860
Implantable Lead Model: 4196
Implantable Lead Model: 5076
Implantable Lead Model: 6947
Implantable Pulse Generator Implant Date: 20150113
Lead Channel Impedance Value: 4047 Ohm
Lead Channel Impedance Value: 418 Ohm
Lead Channel Impedance Value: 456 Ohm
Lead Channel Impedance Value: 513 Ohm
Lead Channel Impedance Value: 532 Ohm
Lead Channel Impedance Value: 893 Ohm
Lead Channel Pacing Threshold Amplitude: 0.875 V
Lead Channel Pacing Threshold Amplitude: 1 V
Lead Channel Pacing Threshold Amplitude: 1.375 V
Lead Channel Pacing Threshold Pulse Width: 0.4 ms
Lead Channel Pacing Threshold Pulse Width: 0.4 ms
Lead Channel Pacing Threshold Pulse Width: 0.6 ms
Lead Channel Sensing Intrinsic Amplitude: 0.875 mV
Lead Channel Sensing Intrinsic Amplitude: 0.875 mV
Lead Channel Sensing Intrinsic Amplitude: 15.625 mV
Lead Channel Sensing Intrinsic Amplitude: 15.625 mV
Lead Channel Setting Pacing Amplitude: 1.5 V
Lead Channel Setting Pacing Amplitude: 1.75 V
Lead Channel Setting Pacing Amplitude: 2.5 V
Lead Channel Setting Pacing Pulse Width: 0.6 ms
Lead Channel Setting Pacing Pulse Width: 1 ms
Lead Channel Setting Sensing Sensitivity: 0.3 mV

## 2018-02-06 ENCOUNTER — Ambulatory Visit (INDEPENDENT_AMBULATORY_CARE_PROVIDER_SITE_OTHER): Payer: PPO | Admitting: *Deleted

## 2018-02-06 DIAGNOSIS — I4819 Other persistent atrial fibrillation: Secondary | ICD-10-CM

## 2018-02-06 DIAGNOSIS — Z5181 Encounter for therapeutic drug level monitoring: Secondary | ICD-10-CM

## 2018-02-06 DIAGNOSIS — I481 Persistent atrial fibrillation: Secondary | ICD-10-CM | POA: Diagnosis not present

## 2018-02-06 DIAGNOSIS — Z7901 Long term (current) use of anticoagulants: Secondary | ICD-10-CM | POA: Diagnosis not present

## 2018-02-06 DIAGNOSIS — I480 Paroxysmal atrial fibrillation: Secondary | ICD-10-CM

## 2018-02-06 LAB — POCT INR: INR: 2.5

## 2018-02-06 NOTE — Patient Instructions (Addendum)
Description   Continue same dose of  coumadin 1 tablet everyday except take 1/2 tablet on Saturdays.   Continue eating 2-3 servings of leafy green vegetable each week.  Recheck in 4 weeks.  Call [phone] with any new medications.

## 2018-02-13 DIAGNOSIS — S2241XD Multiple fractures of ribs, right side, subsequent encounter for fracture with routine healing: Secondary | ICD-10-CM | POA: Diagnosis not present

## 2018-02-17 ENCOUNTER — Telehealth: Payer: Self-pay | Admitting: Internal Medicine

## 2018-02-17 NOTE — Telephone Encounter (Signed)
Returned call to Pt wife. Per Pt wife Pt with a slight cough and some increased ankle swelling. Asked if Pt would like DOD to make a recommendation?  Or if he would like Dr. Rayann Heman to address when he is back in clinic on Monday (after 5 pm on Friday). Per Pt he would like to have Dr. Rayann Heman review. This nurse advised Pt to keep his feet elevated and maintain a low sodium diet over the weekend.  Advised to call after hours this weekend for increased SOB.  Pt and wife indicate understanding.

## 2018-02-17 NOTE — Telephone Encounter (Signed)
New message   Pt c/o swelling: STAT is pt has developed SOB within 24 hours  1) How much weight have you gained and in what time span?   2) If swelling, where is the swelling located? LEGS, ANKLES  3) Are you currently taking a fluid pill? YES  4) Are you currently SOB? SOME per spouse  5) Do you have a log of your daily weights (if so, list)? 214 today, 210 on 4/21, 212 on 4/22  6) Have you gained 3 pounds in a day or 5 pounds in a week?   7) Have you traveled recently? NO

## 2018-02-19 ENCOUNTER — Other Ambulatory Visit: Payer: Self-pay | Admitting: Internal Medicine

## 2018-02-20 ENCOUNTER — Telehealth: Payer: Self-pay

## 2018-02-20 NOTE — Telephone Encounter (Signed)
Call back to wife and left voice mail message that at this time it will not be necessary to come to the office for device check.  Advised that Dr Jackalyn Lombard nurse, Sonia Baller will discuss symptoms of cough and increased ankle swelling on Dr Jackalyn Lombard return to the office.  Advised to use 911 or ER if needed for worsening of symptoms.

## 2018-02-20 NOTE — Telephone Encounter (Signed)
Returned wifes call as requested by voice mail message.  She is getting error message on machine and unsure who to call.  Provided 2 [employer] tech support numbers and advised to call for assistance.

## 2018-02-20 NOTE — Telephone Encounter (Signed)
Wife left message the [facility] tech service department will send a new monitor in 7-10 days.  She asked if patient will need to come in to have device check for fluid accumulation.  She requested a call back.

## 2018-02-20 NOTE — Telephone Encounter (Signed)
Spoke with wife.  Requested a remote transmission be sent for review since I received a message that patient is having fluid symptoms. She is in a physician's office and will have patient send one as soon as she gets home.  Weight increase from 201 to 214 lbs in the last few days but is down to 213 lbs today. He has dizziness and swelling in the feet.  Advised will send copy of review to Dr Jackalyn Lombard nurse Sonia Baller.

## 2018-02-20 NOTE — Telephone Encounter (Signed)
Pt's wife returning your call   

## 2018-02-20 NOTE — Telephone Encounter (Signed)
Me     02/17/18 5:01 PM  Note    Returned call to Pt wife. Per Pt wife Pt with a slight cough and some increased ankle swelling. Asked if Pt would like DOD to make a recommendation?  Or if he would like Dr. Rayann Heman to address when he is back in clinic on Monday (after 5 pm on Friday). Per Pt he would like to have Dr. Rayann Heman review. This nurse advised Pt to keep his feet elevated and maintain a low sodium diet over the weekend.  Advised to call after hours this weekend for increased SOB.  Pt and wife indicate understanding.          02/17/18 4:56 PM  You routed this conversation to [place] Triage        02/17/18 3:48 PM  [place], Alwyn Ren H routed this conversation to [place], Mayford Knife     02/17/18 3:45 PM  Note    New message   Pt c/o swelling: STAT is pt has developed SOB within 24 hours  1. How much weight have you gained and in what time span?   2. If swelling, where is the swelling located? LEGS, ANKLES  3. Are you currently taking a fluid pill? YES  4. Are you currently SOB? SOME per spouse  5. Do you have a log of your daily weights (if so, list)? 214 today, 210 on 4/21, 212 on 4/22  6. Have you gained 3 pounds in a day or 5 pounds in a week?   7. Have you traveled recently? NO

## 2018-02-20 NOTE — Telephone Encounter (Signed)
Call placed to Pt wife.  Left VM per DPR. Requested call back regarding Pt swelling. Left this nurse name and # for call back.

## 2018-02-21 NOTE — Telephone Encounter (Signed)
Left message for wife to return call 

## 2018-02-21 NOTE — Telephone Encounter (Signed)
Spoke with wife-per wife Pt dry weight is 204 pounds.  Pt weighs 214 pounds today.  Pt continues to have lower extremity edema and sob.  BP 139/78, HR 86, 02 92.  Last cr 1.24.  K+ 4.6. Advised Pt to take an extra lasix with his normal am dose for 3 days.  (2 tabs in am, one in pm).  Will call Pt on Friday and see if Pt has improved, if not will schedule to be seen. Wife indicates understanding.

## 2018-02-22 NOTE — Telephone Encounter (Signed)
He is scheduled to see Dr Irish Lack (Primary cardiologist) in June. Currently taking lasix 40mg  BID. Would continue current dosing and avoid salt.  If symptoms worsen, may need to see Dr Irish Lack sooner.  Thanks! JA

## 2018-02-24 ENCOUNTER — Ambulatory Visit (INDEPENDENT_AMBULATORY_CARE_PROVIDER_SITE_OTHER): Payer: Self-pay

## 2018-02-24 ENCOUNTER — Telehealth: Payer: Self-pay

## 2018-02-24 DIAGNOSIS — I5022 Chronic systolic (congestive) heart failure: Secondary | ICD-10-CM

## 2018-02-24 DIAGNOSIS — Z95810 Presence of automatic (implantable) cardiac defibrillator: Secondary | ICD-10-CM

## 2018-02-24 NOTE — Telephone Encounter (Signed)
Received ICM remote transmission.  See todays ICM note for further follow up.

## 2018-02-24 NOTE — Telephone Encounter (Signed)
ICM follow up call to wife.  She said patient has taken extra Lasix 2 days of 3 that was ordered by Dr Rayann Heman on 02/20/2018 for reported symptoms of weight gain, shortness of breath and ankle swelling. He plans on taking the 3rd day of extra dosage today.  Weight decreased from 214 lbs to 211 lbs but shortness of breath and ankle swelling remain same, no improvement.  He received new monitor and waiting on it to charge before they can send a remote transmission.  Advised I would provide Dr Jackalyn Lombard nurse Sonia Baller an update and if any further recommendations I will call her back.

## 2018-02-24 NOTE — Progress Notes (Signed)
EPIC Encounter for ICM Monitoring  Patient Name: [name] is a 84 y.o. male Date: 02/24/2018 Primary Care Physican: [name], MD Primary [place] Electrophysiologist: Allred Dry Weight:211lbs today Bi-V Pacing:99.7%       Wife notified Dr Jackalyn Lombard nurse on 4/26 that patient had shortness of breath, weight gain from 201 lbs to 214 lbs and ankle swelling. Dr Rayann Heman ordered to increase Furosemide to 2 tablets every AM and 1 Tablet every PM x 3 days starting on 4/29 through today. Today, wife stated weight decreased 3 lbs but no change in shortness of breath or ankle swelling.  Patient stated he does not think the extra Furosemide makes him urinate a whole lot more than regular dose.  Dr Rayann Heman suggested if 3 days of extra Furosemide did not relief symptoms to get an appointment with Dr Irish Lack or [place].    Thoracic impedance abnormal suggesting fluid accumulation since 02/03/2018 with varying degrees of worsening.  The extra Furosemide did improve impedance the last 2 days.   Prescribed dosage: Furosemide 40 mg 1 tablet twice a day  Labs: 11/13/2017Creatinine 1.46, BUN 37, Potassium 4.2, Sodium 136, EGFR 44-51 11/12/2017Creatinine 1.33, BUN 30, Potassium 4.2, Sodium 137, EGFR 49-57  11/11/2017Creatinine 1.27, BUN 21, Potassium 4.1, Sodium 136, EGFR 52-60  11/10/2017Creatinine 1.48, BUN 21, Potassium 3.9, Sodium 135, EGFR 43-50  07/02/2016 Creatinine 1.69, BUN 23, Potassium 4.6, Sodium 140  06/23/2016 Creatinine 1.68, BUN 26, Potassium 4.9, Sodium 138  06/02/2016 Creatinine 1.28, BUN 27, Potassium 5.3, Sodium 139  Recommendations: Advised wife to have patient go back to normal Furosemide dosage after today, limit sodium intake (patient likes to eat Saltine crackers) and call ER if symptoms worsen.  Advised a scheduler will contact her to make an appointment for next week.   Follow-up plan: ICM clinic phone appointment on 03/02/2018.    Copy of ICM check sent  to Dr. Rayann Heman and [name].   3 month ICM trend: 02/24/2018    1 Year ICM trend:       Rosalene Billings, RN 02/24/2018 3:18 PM

## 2018-02-24 NOTE — Telephone Encounter (Signed)
ICM clinic following.  Will defer to [facility] clinic and primary cardiology for further f/u.

## 2018-02-24 NOTE — Progress Notes (Signed)
Patient has an appointment with [name], PA on 02/27/2018

## 2018-02-25 ENCOUNTER — Other Ambulatory Visit: Payer: Self-pay | Admitting: Pulmonary Disease

## 2018-02-27 ENCOUNTER — Telehealth: Payer: Self-pay | Admitting: Nurse Practitioner

## 2018-02-27 ENCOUNTER — Encounter: Payer: Self-pay | Admitting: Cardiology

## 2018-02-27 ENCOUNTER — Ambulatory Visit: Payer: PPO | Admitting: Cardiology

## 2018-02-27 VITALS — BP 134/70 | HR 76 | Ht 66.0 in | Wt 210.5 lb

## 2018-02-27 DIAGNOSIS — R600 Localized edema: Secondary | ICD-10-CM

## 2018-02-27 DIAGNOSIS — I5023 Acute on chronic systolic (congestive) heart failure: Secondary | ICD-10-CM

## 2018-02-27 LAB — BASIC METABOLIC PANEL
BUN/Creatinine Ratio: 13 (ref 10–24)
BUN: 16 mg/dL (ref 8–27)
CO2: 26 mmol/L (ref 20–29)
Calcium: 9 mg/dL (ref 8.6–10.2)
Chloride: 96 mmol/L (ref 96–106)
Creatinine, Ser: 1.23 mg/dL (ref 0.76–1.27)
GFR calc Af Amer: 63 mL/min/{1.73_m2} (ref >59)
GFR calc non Af Amer: 54 mL/min/{1.73_m2} — ABNORMAL LOW (ref >59)
Glucose: 105 mg/dL — ABNORMAL HIGH (ref 65–99)
Potassium: 4.3 mmol/L (ref 3.5–5.2)
Sodium: 139 mmol/L (ref 134–144)

## 2018-02-27 LAB — PRO B NATRIURETIC PEPTIDE: NT-Pro BNP: 10884 pg/mL — ABNORMAL HIGH (ref 0–486)

## 2018-02-27 MED ORDER — FUROSEMIDE 40 MG PO TABS: ORAL_TABLET | ORAL | 3 refills | Status: DC

## 2018-02-27 NOTE — Patient Instructions (Addendum)
Medication Instructions:   Your physician recommends that you continue on your current medications as directed. Please refer to the Current Medication list given to you today.   If you need a refill on your cardiac medications before your next appointment, please call your pharmacy.  Labwork:  BNP AND BMP    Testing/Procedures: NONE ORDERED  TODAY   Follow-Up: AS SCHEDULED WITH DR Irish Lack    Any Other Special Instructions Will Be Listed Below (If Applicable).   MAKE SURE YOU ARE  WEIGHING YOUR SELF DAILY CONTACT OFFICE IF YOU GAIN 3 LBS IN A DAY ( 24 HOURS) 5 LBS IN A WEEK .    Low-Sodium Eating Plan Sodium, which is an element that makes up salt, helps you maintain a healthy balance of fluids in your body. Too much sodium can increase your blood pressure and cause fluid and waste to be held in your body. Your health care provider or dietitian may recommend following this plan if you have high blood pressure (hypertension), kidney disease, liver disease, or heart failure. Eating less sodium can help lower your blood pressure, reduce swelling, and protect your heart, liver, and kidneys. What are tips for following this plan? General guidelines  Most people on this plan should limit their sodium intake to 1,500-2,000 mg (milligrams) of sodium each day. Reading food labels  The Nutrition Facts label lists the amount of sodium in one serving of the food. If you eat more than one serving, you must multiply the listed amount of sodium by the number of servings.  Choose foods with less than 140 mg of sodium per serving.  Avoid foods with 300 mg of sodium or more per serving. Shopping  Look for lower-sodium products, often labeled as "low-sodium" or "no salt added."  Always check the sodium content even if foods are labeled as "unsalted" or "no salt added".  Buy fresh foods. ? Avoid canned foods and premade or frozen meals. ? Avoid canned, cured, or processed meats  Buy breads  that have less than 80 mg of sodium per slice. Cooking  Eat more home-cooked food and less restaurant, buffet, and fast food.  Avoid adding salt when cooking. Use salt-free seasonings or herbs instead of table salt or sea salt. Check with your health care provider or pharmacist before using salt substitutes.  Cook with plant-based oils, such as canola, sunflower, or olive oil. Meal planning  When eating at a restaurant, ask that your food be prepared with less salt or no salt, if possible.  Avoid foods that contain MSG (monosodium glutamate). MSG is sometimes added to [place] food, bouillon, and some canned foods. What foods are recommended? The items listed may not be a complete list. Talk with your dietitian about what dietary choices are best for you. Grains Low-sodium cereals, including oats, puffed wheat and rice, and shredded wheat. Low-sodium crackers. Unsalted rice. Unsalted pasta. Low-sodium bread. Whole-grain breads and whole-grain pasta. Vegetables Fresh or frozen vegetables. "No salt added" canned vegetables. "No salt added" tomato sauce and paste. Low-sodium or reduced-sodium tomato and vegetable juice. Fruits Fresh, frozen, or canned fruit. Fruit juice. Meats and other protein foods Fresh or frozen (no salt added) meat, poultry, seafood, and fish. Low-sodium canned tuna and salmon. Unsalted nuts. Dried peas, beans, and lentils without added salt. Unsalted canned beans. Eggs. Unsalted nut butters. Dairy Milk. Soy milk. Cheese that is naturally low in sodium, such as ricotta cheese, fresh mozzarella, or Swiss cheese Low-sodium or reduced-sodium cheese. Cream cheese. Yogurt. Fats  and oils Unsalted butter. Unsalted margarine with no trans fat. Vegetable oils such as canola or olive oils. Seasonings and other foods Fresh and dried herbs and spices. Salt-free seasonings. Low-sodium mustard and ketchup. Sodium-free salad dressing. Sodium-free light mayonnaise. Fresh or refrigerated  horseradish. Lemon juice. Vinegar. Homemade, reduced-sodium, or low-sodium soups. Unsalted popcorn and pretzels. Low-salt or salt-free chips. What foods are not recommended? The items listed may not be a complete list. Talk with your dietitian about what dietary choices are best for you. Grains Instant hot cereals. Bread stuffing, pancake, and biscuit mixes. Croutons. Seasoned rice or pasta mixes. Noodle soup cups. Boxed or frozen macaroni and cheese. Regular salted crackers. Self-rising flour. Vegetables Sauerkraut, pickled vegetables, and relishes. Olives. [place] fries. Onion rings. Regular canned vegetables (not low-sodium or reduced-sodium). Regular canned tomato sauce and paste (not low-sodium or reduced-sodium). Regular tomato and vegetable juice (not low-sodium or reduced-sodium). Frozen vegetables in sauces. Meats and other protein foods Meat or fish that is salted, canned, smoked, spiced, or pickled. Bacon, ham, sausage, hotdogs, corned beef, chipped beef, packaged lunch meats, salt pork, jerky, pickled herring, anchovies, regular canned tuna, sardines, salted nuts. Dairy Processed cheese and cheese spreads. Cheese curds. Blue cheese. Feta cheese. String cheese. Regular cottage cheese. Buttermilk. Canned milk. Fats and oils Salted butter. Regular margarine. Ghee. Bacon fat. Seasonings and other foods Onion salt, garlic salt, seasoned salt, table salt, and sea salt. Canned and packaged gravies. Worcestershire sauce. Tartar sauce. Barbecue sauce. Teriyaki sauce. Soy sauce, including reduced-sodium. Steak sauce. Fish sauce. Oyster sauce. Cocktail sauce. Horseradish that you find on the shelf. Regular ketchup and mustard. Meat flavorings and tenderizers. Bouillon cubes. Hot sauce and Tabasco sauce. Premade or packaged marinades. Premade or packaged taco seasonings. Relishes. Regular salad dressings. Salsa. Potato and tortilla chips. Corn chips and puffs. Salted popcorn and pretzels. Canned or  dried soups. Pizza. Frozen entrees and pot pies. Summary  Eating less sodium can help lower your blood pressure, reduce swelling, and protect your heart, liver, and kidneys.  Most people on this plan should limit their sodium intake to 1,500-2,000 mg (milligrams) of sodium each day.  Canned, boxed, and frozen foods are high in sodium. Restaurant foods, fast foods, and pizza are also very high in sodium. You also get sodium by adding salt to food.  Try to cook at home, eat more fresh fruits and vegetables, and eat less fast food, canned, processed, or prepared foods. This information is not intended to replace advice given to you by your health care provider. Make sure you discuss any questions you have with your health care provider. Document Released: 04/02/2002 Document Revised: 10/04/2016 Document Reviewed: 10/04/2016 Elsevier Interactive Patient Education  [employer].

## 2018-02-27 NOTE — Progress Notes (Signed)
02/27/2018 [name]   Sep 18, 1934  [id number]  Primary Physician [name] Orn, MD Primary Cardiologist: Dr. Irish Ramirez Electrophysiologist: Dr. Rayann Ramirez   Reason for Visit/CC: f/u for acute on chronic systolic HF  HPI:  [name] is a 84 y.o. male who is being seen today for acute on chronic systolic HF. He is followed by Dr. Irish Ramirez and Dr. Rayann Ramirez. He has CAD s/p CABG, chronic systolic HF with last echo showing EF of 40%, PAF, h/o CVA on chronic coumadin, ICD, HTN, DM requiring insulin and OSA, on CPAP.   He presents to clinic today given recent development of a 14 pound weight gain over the course of several weeks in addition to lower extremity edema.  He also reports that he was treated for community-acquired pneumonia 3 weeks ago and was placed on antibiotics.  This is improved.  Given his weight gain and edema, he increased his diuretic at home to 80 milligrams of Lasix in the morning and 40 mg at night for 3 days.  He is now back down to his regular dose of 40 mg twice daily.  He notes that his weight is gradually improving down to 210 pounds today.  He reports that his normal dry weight at home is around 200 pounds.   His wife reports significant improvement in his lower extremity edema.  I do not see any significant edema on exam today.  Denies any exertional dyspnea.  No orthopnea or PND.  He also denies chest pain.  No palpitations.  His wife admits that he likes to eat a lot of salt and she has been trying to restrict this, which seems to be helping.  His blood pressure today is 134/70.  Heart rate 76 bpm.  Current Meds  Medication Sig  . acetaminophen (TYLENOL) 500 MG tablet Take 1,000 mg by mouth See admin instructions. Take 2 tablets (1000 mg) by mouth daily at bedtime, may also take 2 tablets every 6 hours as needed for pain/fever  . albuterol (PROVENTIL HFA;VENTOLIN HFA) 108 (90 BASE) MCG/ACT inhaler Inhale 2 puffs into the lungs every 6 (six) hours as needed for  wheezing or shortness of breath. Must keep [name] appt.  [name] albuterol (PROVENTIL) (2.5 MG/3ML) 0.083% nebulizer solution USE 1 VIAL VIA NEBULIZER 3 TIMES A DAY AS DIRECTED. DX:J44.9  . amiodarone (PACERONE) 400 MG tablet TAKE HALF TABLET (200 MG TOTAL) BY MOUTH DAILY.  [name] atorvastatin (LIPITOR) 80 MG tablet TAKE 1 TABLET (80 MG TOTAL) BY MOUTH DAILY.  . bisacodyl (DULCOLAX) 5 MG EC tablet Take 2 tablets (10 mg total) by mouth daily at 12 noon.  . carvedilol (COREG) 6.25 MG tablet TAKE 1 TABLET BY MOUTH TWICE A DAY  . ezetimibe (ZETIA) 10 MG tablet Take 10 mg by mouth daily.  . ferrous sulfate 325 (65 FE) MG tablet Take 325 mg by mouth daily with breakfast.  . fluticasone (FLONASE) 50 MCG/ACT nasal spray SPRAY 2 SPRAYS INTO EACH NOSTRIL EVERY DAY  . fluticasone furoate-vilanterol (BREO ELLIPTA) 100-25 MCG/INH AEPB Inhale 1 puff into the lungs daily.  . Fluticasone-Umeclidin-Vilant (TRELEGY ELLIPTA) 100-62.5-25 MCG/INH AEPB Inhale 1 puff into the lungs daily.  . furosemide (LASIX) 40 MG tablet Take 40 mg by mouth 2 (two) times daily.  . insulin aspart (NOVOLOG FLEXPEN) 100 UNIT/ML FlexPen Inject 6 Units into the skin 3 (three) times daily before meals.  . insulin degludec (TRESIBA FLEXTOUCH) 100 UNIT/ML SOPN FlexTouch Pen Inject 0.3 mLs (30 Units total) into the skin daily  before lunch.  . levothyroxine (SYNTHROID, LEVOTHROID) 100 MCG tablet Take 100 mcg by mouth daily before breakfast.   . Multiple Vitamin (MULTIVITAMIN WITH MINERALS) TABS tablet Take 1 tablet by mouth daily.  [name] PRESCRIPTION MEDICATION Inhale into the lungs at bedtime. CPAP  . senna (SENOKOT) 8.6 MG tablet Take 2 tablets by mouth at bedtime.   [name] SPIRIVA RESPIMAT 2.5 MCG/ACT AERS INHALE 2 PUFFS INTO THE LUNGS DAILY.  [name] spironolactone (ALDACTONE) 25 MG tablet TAKE 1/2 TABLET (12.5 MG TOTAL) BY MOUTH DAILY.  . vitamin B-12 (CYANOCOBALAMIN) 1000 MCG tablet Take 1,000 mcg by mouth daily.  [name] warfarin (COUMADIN) 5 MG tablet TAKE AS  DIRECTED BY COUMADIN CLINIC   Allergies  Allergen Reactions  . Aldactone [Spironolactone] Other (See Comments)    Hyperkalemia  reported by Dr. Lavone Ramirez - pt is currently taking 12.5 mg daily -November 2017 per medication list by same MD   Past Medical History:  Diagnosis Date  . Atrial fibrillation ([place])    persistent, previously seen at [facility] and placed on amiodarone  . Benign prostatic hypertrophy   . CAD (coronary artery disease)    multivessel s/p inferolateral wall MI with subsequent CABG 11/1998.  Cath 2009 with Patent grafts  . Cleft palate   . COPD with emphysema ([place]) 04/01/2010  . DM (diabetes mellitus), type 2 ([place])   . Dyspnea   . GERD (gastroesophageal reflux disease)   . HTN (hypertension)   . Hyperlipidemia   . Hypothyroidism   . Iron deficiency anemia   . Ischemic dilated cardiomyopathy ([place])    EF 35-40% by MUGA 6/11  . Myocardial infarction ([place])   . Nasal septal deviation   . Nephrolithiasis   . OSA (obstructive sleep apnea)   . PAF (paroxysmal atrial fibrillation) ([place])   . Peripheral arterial disease ([facility])    left subclavian artery stenosis  . PNA (pneumonia)   . Psoriasis   . Seborrheic keratosis   . Stroke ([place])   . Systolic congestive heart failure ([place]) 2009   s/p BiV ICD implantation by Dr [name] (MDT)   Family History  Problem Relation Age of Onset  . Asthma Father   . Stroke Father   . Hypertension Father   . Hypertension Mother   . Heart disease Brother   . Heart attack Brother   . Hypertension Sister   . Hypertension Brother    Past Surgical History:  Procedure Laterality Date  . BI-VENTRICULAR IMPLANTABLE CARDIOVERTER DEFIBRILLATOR  (CRT-D)  10-08-08; 11-06-2013   Dr [name] (MDT) implant for primary prevention; gen change to MDT VivaXT CRTD by Dr [name]  . BIV ICD GENERTAOR CHANGE OUT N/A 11/06/2013   Procedure: BIV ICD GENERTAOR CHANGE OUT;  Surgeon: [name] Mark, MD;  Location: [facility] CATH LAB;  Service: Cardiovascular;   Laterality: N/A;  . c-spine surgery    . CARDIOVERSION N/A 04/29/2016   Procedure: CARDIOVERSION;  Surgeon: Pixie Casino, MD;  Location: [facility] ENDOSCOPY;  Service: Cardiovascular;  Laterality: N/A;  . CARPAL TUNNEL RELEASE    . CATARACT EXTRACTION    . CORONARY ARTERY BYPASS GRAFT     LIMA to LAD, SVG to OM, SVG to diagonal  . ELECTROPHYSIOLOGIC STUDY N/A 06/11/2016   Procedure: Atrial Fibrillation Ablation;  Surgeon: Thompson Grayer, MD;  Location: [place] [facility] CV LAB;  Service: Cardiovascular;  Laterality: N/A;  . left cleft palate and left cleft lip repair     Social History   Socioeconomic History  . Marital status: Married  Spouse name: Not on file  . Number of children: Not on file  . Years of education: Not on file  . Highest education level: Not on file  Occupational History  . Occupation: retired    Comment: Engineer, agricultural  Social Needs  . Financial resource strain: Not on file  . Food insecurity:    Worry: Not on file    Inability: Not on file  . Transportation needs:    Medical: Not on file    Non-medical: Not on file  Tobacco Use  . Smoking status: Former Smoker    Packs/day: 3.00    Years: 65.00    Pack years: 195.00    Types: Cigarettes    Last attempt to quit: 10/25/1998    Years since quitting: 19.3  . Smokeless tobacco: Never Used  Substance and Sexual Activity  . Alcohol use: No    Alcohol/week: 0.0 oz    Comment: remote history of heavy alcohol use  . Drug use: No  . Sexual activity: Not on file  Lifestyle  . Physical activity:    Days per week: Not on file    Minutes per session: Not on file  . Stress: Not on file  Relationships  . Social connections:    Talks on phone: Not on file    Gets together: Not on file    Attends religious service: Not on file    Active member of club or organization: Not on file    Attends meetings of clubs or organizations: Not on file    Relationship status: Not on file  . Intimate partner violence:    Fear of  current or ex partner: Not on file    Emotionally abused: Not on file    Physically abused: Not on file    Forced sexual activity: Not on file  Other Topics Concern  . Not on file  Social History Narrative   Lives [place]   Retired     Review of Systems: General: negative for chills, fever, night sweats or weight changes.  Cardiovascular: negative for chest pain, dyspnea on exertion, edema, orthopnea, palpitations, paroxysmal nocturnal dyspnea or shortness of breath Dermatological: negative for rash Respiratory: negative for cough or wheezing Urologic: negative for hematuria Abdominal: negative for nausea, vomiting, diarrhea, bright red blood per rectum, melena, or hematemesis Neurologic: negative for visual changes, syncope, or dizziness All other systems reviewed and are otherwise negative except as noted above.   Physical Exam:  Blood pressure 134/70, pulse 76, height 5\' 6"  (1.676 m), weight 210 lb 8 oz (95.5 kg).  General appearance: alert, cooperative and no distress Neck: no carotid bruit and no JVD Lungs: clear to auscultation bilaterally Heart: regular rate and rhythm, S1, S2 normal, no murmur, click, rub or gallop Extremities: extremities normal, atraumatic, no cyanosis or edema Pulses: 2+ and symmetric Skin: Skin color, texture, turgor normal. No rashes or lesions Neurologic: Grossly normal  EKG not performed -- personally reviewed   ASSESSMENT AND PLAN:   1. Acute on Chronic Systolic HF: He does not appear to be grossly volume overloaded, however he reports that his weight at home is still 10 lb above his usual dry weight (200 lb), Weight today is 210 lb (by home scale, also c/w with our office scale). He notes he had gained at total of 14 lb but lost 4 with increase in diuretic. We will check a BNP today to help guide further diuresis. Will also check a BMP to check renal function and  electrolytes. We discussed the importance of salt restriction and continuation  of daily weights.   2. PAF: RRR on exam. On amiodarone and chronic anticoagulation.   3. CAD: h/o CABG. He denies CP. Continue medical therapy.   4. HTN: controlled on current regimen.   5. ICD: denies shocks. Followed in EP clinic.   6. OSA: reports compliance w/ CPAP.    Follow-Up: will determine need for further diuretic adjustment and repeat labs based on today's lab work. Keep f/u with Dr. Irish Ramirez on 04/13/2018.   Ogle Hoeffner Ladoris Gene, MHS [facility] 02/27/2018 10:12 AM

## 2018-02-27 NOTE — Telephone Encounter (Signed)
Reviewed abnormal lab results with Dr. Harrington Challenger, DOD, who advised that patient should increase Lasix to 80 mg in the morning and 40 mg in the afternoon and come in for repeat BNP and BMET next Monday. I called and advised patient's wife (per DPR) who verbalized understanding and agreement. She states patient has an appointment with coumadin clinic on Monday 5/13 and understands to get lab work as well. Wife thanked me for the call and is aware to call back with questions or concerns.

## 2018-02-28 ENCOUNTER — Telehealth: Payer: Self-pay | Admitting: *Deleted

## 2018-02-28 NOTE — Telephone Encounter (Signed)
LMOVM TO CONTACT CLINIC BACK FOR LAB RESULTS

## 2018-02-28 NOTE — Telephone Encounter (Signed)
-----   Message from Consuelo Pandy, [place] sent at 02/28/2018  1:53 PM EDT ----- Agree with Dr.Ross's recommendations regarding lasix, given elevated BNP at 10K. Increase lasix for acute on chronic systolic CHF.   Increase Lasix to 80 mg in the morning and 40 mg in the afternoon and come in for repeat BNP and BMET next Monday.

## 2018-02-28 NOTE — Telephone Encounter (Signed)
SPOKE WITH PT WIFE WHO SAYS THEY HAVE ALREADY BEEN CONTACTED ABOUT INSTRUCTIONS AND LABS.

## 2018-03-02 ENCOUNTER — Ambulatory Visit (INDEPENDENT_AMBULATORY_CARE_PROVIDER_SITE_OTHER): Payer: PPO

## 2018-03-02 DIAGNOSIS — Z95810 Presence of automatic (implantable) cardiac defibrillator: Secondary | ICD-10-CM

## 2018-03-02 DIAGNOSIS — I5022 Chronic systolic (congestive) heart failure: Secondary | ICD-10-CM

## 2018-03-02 NOTE — Progress Notes (Signed)
EPIC Encounter for ICM Monitoring  Patient Name: [name] is a 84 y.o. male Date: 03/02/2018 Primary Care Physican: [name], MD Primary [place] Electrophysiologist: Allred Dry Weight:Previous weight 211lbs  Bi-V Pacing:99.6%       Attempted call to wife. Left detailed message regarding transmission. Transmission reviewed.    Thoracic impedance returned to normal but just starting to trend slightly below baseline.   Prescribed dosage: Furosemide 40 mg take 2 tablets (80 mg total) every morning and 1 tablet (40 mg total) every afternoon. (increased after last office visit 02/27/2018)  Labs: 02/27/2018 Creatinine 1.23, BUN 16, Potassium 4.3, Sodium 139, EGFR 54-63, BNP 10,884 12/30/2017 Creatinine 1.24  BUN 15, Potassium 4.6, Sodium 139, EGFR 54-60 11/13/2017Creatinine 1.46, BUN 37, Potassium 4.2, Sodium 136, EGFR 44-51 11/12/2017Creatinine 1.33, BUN 30, Potassium 4.2, Sodium 137, EGFR 49-57  11/11/2017Creatinine 1.27, BUN 21, Potassium 4.1, Sodium 136, EGFR 52-60  11/10/2017Creatinine 1.48, BUN 21, Potassium 3.9, Sodium 135, EGFR 43-50  07/02/2016 Creatinine 1.69, BUN 23, Potassium 4.6, Sodium 140  06/23/2016 Creatinine 1.68, BUN 26, Potassium 4.9, Sodium 138  06/02/2016 Creatinine 1.28, BUN 27, Potassium 5.3, Sodium 139  Recommendations:  Left voice mail with ICM number and encouraged to call if experiencing any fluid symptoms.  Follow-up plan: ICM clinic phone appointment on 03/17/2018 to recheck fluid levels.  Office appointment scheduled 04/13/2018 with [name].   Copy of ICM check sent to Dr. Rayann Heman, [name] and [name], [place].   3 month ICM trend: 03/02/2018    1 Year ICM trend:       Rosalene Billings, RN 03/02/2018 10:37 AM

## 2018-03-03 ENCOUNTER — Telehealth: Payer: Self-pay

## 2018-03-03 NOTE — Telephone Encounter (Signed)
Remote ICM transmission received.  Attempted call to wife and left message to return call.   

## 2018-03-06 ENCOUNTER — Other Ambulatory Visit: Payer: PPO | Admitting: *Deleted

## 2018-03-06 ENCOUNTER — Telehealth: Payer: Self-pay | Admitting: Nurse Practitioner

## 2018-03-06 ENCOUNTER — Ambulatory Visit (INDEPENDENT_AMBULATORY_CARE_PROVIDER_SITE_OTHER): Payer: PPO | Admitting: *Deleted

## 2018-03-06 DIAGNOSIS — I5022 Chronic systolic (congestive) heart failure: Secondary | ICD-10-CM

## 2018-03-06 DIAGNOSIS — I5023 Acute on chronic systolic (congestive) heart failure: Secondary | ICD-10-CM | POA: Diagnosis not present

## 2018-03-06 DIAGNOSIS — Z5181 Encounter for therapeutic drug level monitoring: Secondary | ICD-10-CM

## 2018-03-06 DIAGNOSIS — Z7901 Long term (current) use of anticoagulants: Secondary | ICD-10-CM | POA: Diagnosis not present

## 2018-03-06 DIAGNOSIS — I4819 Other persistent atrial fibrillation: Secondary | ICD-10-CM

## 2018-03-06 DIAGNOSIS — I481 Persistent atrial fibrillation: Secondary | ICD-10-CM

## 2018-03-06 DIAGNOSIS — I480 Paroxysmal atrial fibrillation: Secondary | ICD-10-CM | POA: Diagnosis not present

## 2018-03-06 LAB — BASIC METABOLIC PANEL
BUN/Creatinine Ratio: 14 (ref 10–24)
BUN: 16 mg/dL (ref 8–27)
CO2: 28 mmol/L (ref 20–29)
Calcium: 8.6 mg/dL (ref 8.6–10.2)
Chloride: 98 mmol/L (ref 96–106)
Creatinine, Ser: 1.18 mg/dL (ref 0.76–1.27)
GFR calc Af Amer: 66 mL/min/{1.73_m2} (ref >59)
GFR calc non Af Amer: 57 mL/min/{1.73_m2} — ABNORMAL LOW (ref >59)
Glucose: 140 mg/dL — ABNORMAL HIGH (ref 65–99)
Potassium: 4.7 mmol/L (ref 3.5–5.2)
Sodium: 140 mmol/L (ref 134–144)

## 2018-03-06 LAB — PRO B NATRIURETIC PEPTIDE: NT-Pro BNP: 12873 pg/mL — ABNORMAL HIGH (ref 0–486)

## 2018-03-06 LAB — POCT INR: INR: 4

## 2018-03-06 MED ORDER — FUROSEMIDE 40 MG PO TABS: 80.0000 mg | ORAL_TABLET | Freq: Two times a day (BID) | ORAL | 11 refills | Status: DC

## 2018-03-06 NOTE — Patient Instructions (Signed)
Description   Do not take any Coumadin today then continue same dose of  coumadin 1 tablet everyday except take 1/2 tablet on Saturdays.   Continue eating 2-3 servings of leafy green vegetable each week.  Recheck in 3 weeks.  Call [phone] with any new medications.

## 2018-03-06 NOTE — Telephone Encounter (Signed)
Reviewed lab results with patient's primary cardiologist, [name]. He advised patient to increase Furosemide 80 mg BID and come in for repeat BMET and BNP in 1 week. I reviewed his advice with patient's wife [name] who verbalized understanding and agreement. She states patient complains of fatigue, but not worsening SOB or weight gain. I advised her to call back this week with questions or concerns and she thanked me for the call.

## 2018-03-10 DIAGNOSIS — I5022 Chronic systolic (congestive) heart failure: Secondary | ICD-10-CM | POA: Diagnosis not present

## 2018-03-10 DIAGNOSIS — I480 Paroxysmal atrial fibrillation: Secondary | ICD-10-CM | POA: Diagnosis not present

## 2018-03-10 DIAGNOSIS — N183 Chronic kidney disease, stage 3 (moderate): Secondary | ICD-10-CM | POA: Diagnosis not present

## 2018-03-10 DIAGNOSIS — Z23 Encounter for immunization: Secondary | ICD-10-CM | POA: Diagnosis not present

## 2018-03-10 DIAGNOSIS — G4733 Obstructive sleep apnea (adult) (pediatric): Secondary | ICD-10-CM | POA: Diagnosis not present

## 2018-03-10 DIAGNOSIS — I25118 Atherosclerotic heart disease of native coronary artery with other forms of angina pectoris: Secondary | ICD-10-CM | POA: Diagnosis not present

## 2018-03-10 DIAGNOSIS — E039 Hypothyroidism, unspecified: Secondary | ICD-10-CM | POA: Diagnosis not present

## 2018-03-10 DIAGNOSIS — I129 Hypertensive chronic kidney disease with stage 1 through stage 4 chronic kidney disease, or unspecified chronic kidney disease: Secondary | ICD-10-CM | POA: Diagnosis not present

## 2018-03-10 DIAGNOSIS — E1122 Type 2 diabetes mellitus with diabetic chronic kidney disease: Secondary | ICD-10-CM | POA: Diagnosis not present

## 2018-03-10 DIAGNOSIS — E7800 Pure hypercholesterolemia, unspecified: Secondary | ICD-10-CM | POA: Diagnosis not present

## 2018-03-10 DIAGNOSIS — I739 Peripheral vascular disease, unspecified: Secondary | ICD-10-CM | POA: Diagnosis not present

## 2018-03-10 DIAGNOSIS — E1142 Type 2 diabetes mellitus with diabetic polyneuropathy: Secondary | ICD-10-CM | POA: Diagnosis not present

## 2018-03-10 DIAGNOSIS — Z1389 Encounter for screening for other disorder: Secondary | ICD-10-CM | POA: Diagnosis not present

## 2018-03-10 DIAGNOSIS — Z0000 Encounter for general adult medical examination without abnormal findings: Secondary | ICD-10-CM | POA: Diagnosis not present

## 2018-03-13 ENCOUNTER — Other Ambulatory Visit: Payer: PPO | Admitting: *Deleted

## 2018-03-13 DIAGNOSIS — I5022 Chronic systolic (congestive) heart failure: Secondary | ICD-10-CM | POA: Diagnosis not present

## 2018-03-13 LAB — BASIC METABOLIC PANEL
BUN/Creatinine Ratio: 16 (ref 10–24)
BUN: 19 mg/dL (ref 8–27)
CO2: 28 mmol/L (ref 20–29)
Calcium: 8.4 mg/dL — ABNORMAL LOW (ref 8.6–10.2)
Chloride: 98 mmol/L (ref 96–106)
Creatinine, Ser: 1.18 mg/dL (ref 0.76–1.27)
GFR calc Af Amer: 66 mL/min/{1.73_m2} (ref >59)
GFR calc non Af Amer: 57 mL/min/{1.73_m2} — ABNORMAL LOW (ref >59)
Glucose: 108 mg/dL — ABNORMAL HIGH (ref 65–99)
Potassium: 3.9 mmol/L (ref 3.5–5.2)
Sodium: 139 mmol/L (ref 134–144)

## 2018-03-13 LAB — PRO B NATRIURETIC PEPTIDE: NT-Pro BNP: 5305 pg/mL — ABNORMAL HIGH (ref 0–486)

## 2018-03-15 ENCOUNTER — Telehealth: Payer: Self-pay

## 2018-03-15 MED ORDER — FUROSEMIDE 80 MG PO TABS: 80.0000 mg | ORAL_TABLET | Freq: Two times a day (BID) | ORAL | 3 refills | Status: DC

## 2018-03-15 NOTE — Telephone Encounter (Signed)
Called and spoke to patient's wife (DPR on file) and made her aware of lab results. Instructed for the patient to continue current meds including the higher dose of lasix. New Rx sent to pharmacy. Wife verbalized understanding and thanked me for the call.

## 2018-03-15 NOTE — Telephone Encounter (Signed)
-----   Message from Jettie Booze, MD sent at 03/14/2018  2:21 PM EDT ----- Electrolytes stable compared to prior

## 2018-03-17 ENCOUNTER — Ambulatory Visit (INDEPENDENT_AMBULATORY_CARE_PROVIDER_SITE_OTHER): Payer: Self-pay

## 2018-03-17 DIAGNOSIS — I5022 Chronic systolic (congestive) heart failure: Secondary | ICD-10-CM

## 2018-03-17 DIAGNOSIS — Z95810 Presence of automatic (implantable) cardiac defibrillator: Secondary | ICD-10-CM

## 2018-03-17 NOTE — Progress Notes (Signed)
EPIC Encounter for ICM Monitoring  Patient Name: [name] is a 84 y.o. male Date: 03/17/2018 Primary Care Physican: [name], MD Primary [place] Electrophysiologist: Allred Dry Weight:Previous weight 211lbs  Bi-V Pacing:99.6%                                             Attempted Call to wife. .  Left detailed message, per DPR, regarding transmission.  Transmission reviewed.    Thoracic impedance slightly below baseline normal suggesting fluid accumulation since 03/12/2018.  Prescribed dosage: Furosemide 40 mg take 2 tablets (80 mg total) twice a day (increased on 03/06/2018).  Labs: 02/27/2018 Creatinine 1.23, BUN 16, Potassium 4.3, Sodium 139, EGFR 54-63, BNP 10,884 12/30/2017 Creatinine 1.24  BUN 15, Potassium 4.6, Sodium 139, EGFR 54-60 11/13/2017Creatinine 1.46, BUN 37, Potassium 4.2, Sodium 136, EGFR 44-51 11/12/2017Creatinine 1.33, BUN 30, Potassium 4.2, Sodium 137, EGFR 49-57  11/11/2017Creatinine 1.27, BUN 21, Potassium 4.1, Sodium 136, EGFR 52-60  11/10/2017Creatinine 1.48, BUN 21, Potassium 3.9, Sodium 135, EGFR 43-50  07/02/2016 Creatinine 1.69, BUN 23, Potassium 4.6, Sodium 140  06/23/2016 Creatinine 1.68, BUN 26, Potassium 4.9, Sodium 138  06/02/2016 Creatinine 1.28, BUN 27, Potassium 5.3, Sodium 139  Recommendations: Left voice mail with ICM number and encouraged to call if experiencing any fluid symptoms.  Follow-up plan: ICM clinic phone appointment on 04/03/2018.  Office appointment scheduled 04/13/2018 with [name].  Copy of ICM check sent to [name] and Dr. Rayann Heman.   3 month ICM trend: 03/17/2018    1 Year ICM trend:       Rosalene Billings, RN 03/17/2018 12:11 PM

## 2018-03-27 ENCOUNTER — Ambulatory Visit (INDEPENDENT_AMBULATORY_CARE_PROVIDER_SITE_OTHER): Payer: PPO | Admitting: *Deleted

## 2018-03-27 DIAGNOSIS — Z5181 Encounter for therapeutic drug level monitoring: Secondary | ICD-10-CM | POA: Diagnosis not present

## 2018-03-27 DIAGNOSIS — I481 Persistent atrial fibrillation: Secondary | ICD-10-CM | POA: Diagnosis not present

## 2018-03-27 DIAGNOSIS — I480 Paroxysmal atrial fibrillation: Secondary | ICD-10-CM

## 2018-03-27 DIAGNOSIS — Z7901 Long term (current) use of anticoagulants: Secondary | ICD-10-CM | POA: Diagnosis not present

## 2018-03-27 DIAGNOSIS — I4819 Other persistent atrial fibrillation: Secondary | ICD-10-CM

## 2018-03-27 LAB — POCT INR: INR: 3.1 — AB (ref 2.0–3.0)

## 2018-03-27 NOTE — Patient Instructions (Signed)
Description   Today June 3rd take 1/2 tablet then change coumadin dose to  1 tablet everyday except take 1/2 tablet on  Wednesdays and  Saturdays.   Continue eating 2-3 servings of leafy green vegetable each week.  Recheck in 2 weeks.  Call [phone] with any new medications.

## 2018-03-30 ENCOUNTER — Telehealth: Payer: Self-pay

## 2018-03-30 NOTE — Telephone Encounter (Signed)
Call back to wife and advised patient has had some afib today and impedance looks dry.  Furosemide was increased to 80 mg twice a day on 03/06/2018 (see phone note).  Advised to have him increase fluids for a couple of days.   She asked if she will receive a call back today on what to do about his BP.  Advised triage will call her back after discussing with physician.

## 2018-03-30 NOTE — Telephone Encounter (Signed)
Called patient's wife back about patient's fall and low BP. Had wife take patient's BP, it is 116/66 HR 87. Wife stated patient fell forward and caught himself with his hands, but was too weak to get up. Patient's wife helped him back up and that is when patient's BP was low at 70/54. Patient has since drank some fluids and states he feels fine. Informed patient to check BP before he takes his next dose of Lasix to make sure it's not too low. Informed him to hold his Lasix and give our office a call if it's too low. Encouraged patient to keep a record of his BP the next couple of days to see if this was just an one time occurrence. Message would be sent to his cardiologist for further instructions. Patient's wife verbalized understanding.

## 2018-03-30 NOTE — Telephone Encounter (Signed)
Received call from wife.  She stated patients BP has been running low today, 70/54. Patient has taken all of his morning meds.  He denies dizziness, lightheadedness, breathing difficulty or pain.  She said he bent over to get something off the floor and fell forward around 1 or 1:30 PM today.  He did not lose  Consciousness and unsure why he fell forward. She assisted him to a chair. She has tried using the pulse oximeter but does not register on him but does her.  She is concerned for the low BP and falling.  Advised to send remote transmission for review.  Remote transmission reviewed by Memory Dance, RN device clinic.  Patient has some afib in the last 2 days and impedance is above baseline suggesting slight dryness.  Will route to general triage due to low BP.

## 2018-03-31 ENCOUNTER — Telehealth: Payer: Self-pay | Admitting: Interventional Cardiology

## 2018-03-31 ENCOUNTER — Other Ambulatory Visit: Payer: Self-pay | Admitting: Interventional Cardiology

## 2018-03-31 MED ORDER — FUROSEMIDE 80 MG PO TABS: 40.0000 mg | ORAL_TABLET | Freq: Two times a day (BID) | ORAL | 3 refills | Status: DC

## 2018-03-31 NOTE — Telephone Encounter (Signed)
New message    Patient spouse calling to report BP.  Pt c/o BP issue: STAT if pt c/o blurred vision, one-sided weakness or slurred speech  1. What are your last 5 BP readings? 94/62, 87/54  2. Are you having any other symptoms (ex. Dizziness, headache, blurred vision, passed out)? weak  3. What is your BP issue? Low BP

## 2018-03-31 NOTE — Telephone Encounter (Signed)
OK to decrease Lasix to 40 mg BID.

## 2018-03-31 NOTE — Telephone Encounter (Signed)
See phone note from 03/31/18.

## 2018-03-31 NOTE — Telephone Encounter (Signed)
Called and made patient's wife aware (DPR on file) that the patient can decrease lasix to 40 mg BID starting tomorrow. Instructed for patient to check his BP before taking any meds. Instructed for the patient to stay hydrated and to continue to monitor BP and symptoms. Wife verbalized understanding and thanked me for the call.

## 2018-03-31 NOTE — Telephone Encounter (Signed)
Patient's wife calling (DPR on file) and states that she called in yesterday about the patient's BP being low. She states that his BP was low again this morning at 87/54, HR 86. She states that she took him to the fire department to have them check his BP and it was 94/62, HR 87. Remote transmission was sent in yesterday that showed that the patient has had some afib in the last 2 days and impedance is above baseline suggesting slight dryness. Patient's lasix was increased from 40 mg BID to 80 mg BID on 5/13 d/t elevated BNP of 12,873. Patient also takes spironolactone 12.5 mg QD and carvedilol 6.25 mg BID. Patient feels weak and fatigued. Patient denies having lightheadedness, dizziness, SOB, LEE, weight gain, syncope, or any other symptoms.   BP this morning of 87/54 was after his morning meds which included lasix 80 mg, spironolactone 12.5 mg, and carvedilol 6.25 mg. Wife states that the patient did not check his BP before taking his meds. Instructed for the patient to hold his lasix today, increase his fluid intake, continue to monitor BP, and to let us know if he becomes symptomatic. Made them aware that I would forward the information to [name] to make him aware and see if he would like to decrease his lasix back to 40 mg BID.

## 2018-04-03 ENCOUNTER — Ambulatory Visit (INDEPENDENT_AMBULATORY_CARE_PROVIDER_SITE_OTHER): Payer: PPO

## 2018-04-03 ENCOUNTER — Telehealth: Payer: Self-pay

## 2018-04-03 DIAGNOSIS — Z95810 Presence of automatic (implantable) cardiac defibrillator: Secondary | ICD-10-CM

## 2018-04-03 DIAGNOSIS — I5022 Chronic systolic (congestive) heart failure: Secondary | ICD-10-CM | POA: Diagnosis not present

## 2018-04-03 NOTE — Telephone Encounter (Signed)
Remote ICM transmission received.  Attempted call to wife and left message to return call.   

## 2018-04-03 NOTE — Progress Notes (Signed)
EPIC Encounter for ICM Monitoring  Patient Name: [name] is a 84 y.o. male Date: 04/03/2018 Primary Care Physican: Griffin, John, MD Primary Cardiologist:[place] Electrophysiologist: Allred Dry Weight:207lbs   Clinical Status (30-Mar-2018 to 03-Apr-2018) V Pacing:11.1% (99.6% at previous transmission from 5/24 to 6/6) Atrial Pacing: 23.2% (93.6% at previous transmission from 5/24 to 6/6)  % Time AT/AF 100% from 6/6 to 6/10 compared to previous report 5/24 to 6/6 was 6.5%.   Clinical Status (30-Mar-2018 to 03-Apr-2018)  Treated VT/VF 0 episodes   AT/AF 1 episode   Time in AT/AF 24.0 hr/day (100.0%)  Observations (2) (30-Mar-2018 to 03-Apr-2018)  AT/AF >= 6 hr for 4 days.             Spoke to wife and patient.  BP today 94/54 which is lower than the numbers he had this past weekend. BP has improved since decreasing Lasix on 03/31/2018.  He feels tired and fatigued but denied any problems with fluid.        Has not had any palpitations and not missed any medication dosages.    Thoracic impedance normal.  Message sent to device clinic triage to review todays report.  Prescribed dosage: Furosemide40 mgtake 1tablet twice a day (decreased on 03/31/2018) due to low BP.  Labs: 02/27/2018 Creatinine 1.23, BUN 16, Potassium 4.3, Sodium 139, EGFR 54-63, BNP 10,884 12/30/2017 Creatinine 1.24 BUN 15, Potassium 4.6, Sodium 139, EGFR 54-60 11/13/2017Creatinine 1.46, BUN 37, Potassium 4.2, Sodium 136, EGFR 44-51 11/12/2017Creatinine 1.33, BUN 30, Potassium 4.2, Sodium 137, EGFR 49-57  11/11/2017Creatinine 1.27, BUN 21, Potassium 4.1, Sodium 136, EGFR 52-60  11/10/2017Creatinine 1.48, BUN 21, Potassium 3.9, Sodium 135, EGFR 43-50  07/02/2016 Creatinine 1.69, BUN 23, Potassium 4.6, Sodium 140  06/23/2016 Creatinine 1.68, BUN 26, Potassium 4.9, Sodium 138  06/02/2016 Creatinine 1.28, BUN 27, Potassium 5.3, Sodium 139  Recommendations: No changes.  Encouraged to call  for fluid symptoms.  Follow-up plan: ICM clinic phone appointment on 04/13/2018 same day as office visit with Dr Varanasi.  Office appointment scheduled 05/11/2018 with Amber Seiler, NP.   Copy of ICM check sent to Dr. Varanasi and Dr. Allred for review and recommendations if needed.   3 month ICM trend: 04/03/2018    AT/AF    1 Year ICM trend:       [name], RN 04/03/2018 9:56 AM   

## 2018-04-04 NOTE — Progress Notes (Signed)
Report reviewed by Ria Clock, device RN.  She reported it appears he does not BiVP while in AF/AFlutter which is currently ~2 days. Based on his history these episodes are brief in duration and would recommend continuing to monitor.

## 2018-04-13 ENCOUNTER — Encounter: Payer: Self-pay | Admitting: Interventional Cardiology

## 2018-04-13 ENCOUNTER — Ambulatory Visit (INDEPENDENT_AMBULATORY_CARE_PROVIDER_SITE_OTHER): Payer: Self-pay | Admitting: *Deleted

## 2018-04-13 ENCOUNTER — Ambulatory Visit (INDEPENDENT_AMBULATORY_CARE_PROVIDER_SITE_OTHER): Payer: PPO | Admitting: *Deleted

## 2018-04-13 ENCOUNTER — Ambulatory Visit (INDEPENDENT_AMBULATORY_CARE_PROVIDER_SITE_OTHER): Payer: PPO | Admitting: Interventional Cardiology

## 2018-04-13 ENCOUNTER — Ambulatory Visit (INDEPENDENT_AMBULATORY_CARE_PROVIDER_SITE_OTHER): Payer: PPO

## 2018-04-13 VITALS — BP 122/64 | HR 86 | Ht 66.0 in | Wt 211.0 lb

## 2018-04-13 DIAGNOSIS — Z5181 Encounter for therapeutic drug level monitoring: Secondary | ICD-10-CM

## 2018-04-13 DIAGNOSIS — I481 Persistent atrial fibrillation: Secondary | ICD-10-CM

## 2018-04-13 DIAGNOSIS — I5022 Chronic systolic (congestive) heart failure: Secondary | ICD-10-CM

## 2018-04-13 DIAGNOSIS — Z7901 Long term (current) use of anticoagulants: Secondary | ICD-10-CM | POA: Diagnosis not present

## 2018-04-13 DIAGNOSIS — I739 Peripheral vascular disease, unspecified: Secondary | ICD-10-CM

## 2018-04-13 DIAGNOSIS — I480 Paroxysmal atrial fibrillation: Secondary | ICD-10-CM | POA: Diagnosis not present

## 2018-04-13 DIAGNOSIS — I4819 Other persistent atrial fibrillation: Secondary | ICD-10-CM

## 2018-04-13 DIAGNOSIS — Z95810 Presence of automatic (implantable) cardiac defibrillator: Secondary | ICD-10-CM

## 2018-04-13 DIAGNOSIS — I255 Ischemic cardiomyopathy: Secondary | ICD-10-CM

## 2018-04-13 LAB — POCT INR: INR: 2.1 (ref 2.0–3.0)

## 2018-04-13 NOTE — Patient Instructions (Addendum)
Medication Instructions:  Your physician recommends that you continue on your current medications as directed. Please refer to the Current Medication list given to you today.   Labwork: None ordered  Testing/Procedures: None ordered  Follow-Up: Your physician wants you to follow-up in: 6 months with [name]. You will receive a reminder letter in the mail two months in advance. If you don't receive a letter, please call our office to schedule the follow-up appointment.   Any Other Special Instructions Will Be Listed Below (If Applicable).   If you notice an increase in swelling or shortness of breath or if you gain more than 3 pounds in 24 hours or 5 pounds in 1 week, you may take an extra lasix up to 2 times a week. Please call the office if you are having to take an extra lasix more than 2 times a week.   If you need a refill on your cardiac medications before your next appointment, please call your pharmacy.

## 2018-04-13 NOTE — Progress Notes (Signed)
EPIC Encounter for ICM Monitoring  Patient Name: [name] is a 84 y.o. male Date: 04/13/2018 Primary Care Physican: Griffin, John, MD Primary Cardiologist:Varanasi Electrophysiologist: Allred Dry Weight:207lbs   V Pacing:48.8% (99.6% at previous transmission from 5/24 to 6/6) Atrial Pacing: 47.2% (93.6% at previous transmission from 5/24 to 6/6)  Clinical Status (03-Apr-2018 to 12-Apr-2018)  Treated VT/VF 0 episodes   AT/AF 3 episodes   Time in AT/AF 14.4 hr/day (60.0%)  Observations (2) (03-Apr-2018 to 12-Apr-2018)  AT/AF >= 6 hr for 6 days.  V. Pacing less than 90%.      Will attempt ICM call after patient's office visit today.    Thoracic impedance normal.  Prescribed dosage: Furosemide40 mgtake 1tablettwice a day (decreased on 03/31/2018) due to low BP.  Labs: 02/27/2018 Creatinine 1.23, BUN 16, Potassium 4.3, Sodium 139, EGFR 54-63, BNP 10,884 12/30/2017 Creatinine 1.24 BUN 15, Potassium 4.6, Sodium 139, EGFR 54-60 11/13/2017Creatinine 1.46, BUN 37, Potassium 4.2, Sodium 136, EGFR 44-51 11/12/2017Creatinine 1.33, BUN 30, Potassium 4.2, Sodium 137, EGFR 49-57  11/11/2017Creatinine 1.27, BUN 21, Potassium 4.1, Sodium 136, EGFR 52-60  11/10/2017Creatinine 1.48, BUN 21, Potassium 3.9, Sodium 135, EGFR 43-50  07/02/2016 Creatinine 1.69, BUN 23, Potassium 4.6, Sodium 140  06/23/2016 Creatinine 1.68, BUN 26, Potassium 4.9, Sodium 138  06/02/2016 Creatinine 1.28, BUN 27, Potassium 5.3, Sodium 139  Recommendations:  Any recommendations will be given at time of office visit with Dr Varanasi today, 04/13/2018.  Follow-up plan: ICM clinic phone appointment on 05/04/2018.    Copy of ICM check sent to Dr. Varanasi for review if needed at office visit today and Dr. Allred.   3 month ICM trend: 04/12/2018    AT/AF    1 Year ICM trend:       [name], RN 04/13/2018 7:42 AM   

## 2018-04-13 NOTE — Patient Instructions (Signed)
Description   Continue same dose of  coumadin   1 tablet everyday except take 1/2 tablet on  Wednesdays and  Saturdays.   Continue eating 2-3 servings of leafy green vegetable each week.  Recheck in 3 weeks.  Call [phone] with any new medications.

## 2018-04-13 NOTE — Progress Notes (Signed)
Remote ICD transmission.   

## 2018-04-13 NOTE — Progress Notes (Signed)
Cardiology Office Note   Date:  04/13/2018   ID:  [name], DOB Dec 01, 1933, MRN [id number]  PCP:  [name], MD    No chief complaint on file.  CAD, COPD  Wt Readings from Last 3 Encounters:  04/13/18 211 lb (95.7 kg)  02/27/18 210 lb 8 oz (95.5 kg)  12/30/17 212 lb (96.2 kg)       History of Present Illness: [name] is a 84 y.o. male  Who has a history of COPD on CPAP, left subclavian stenosis, CHF ( systolic, EF 76% in 5465), Afib s/p biventricular defibrillator, CAD s/p CABG, hypothyroidism, HTN, CVA.  Admitted 01/16/16 with acute resp. Failure with hypoxia acute COPD exacerbation RLL PNA, and acute systolic HF.Chronic systolic CHF: EF 03-54%  Paroxsymal Afib At discharge in 3/17: - Currently in AV paced rhythm on amiodarone. - CHADSVASC of 8, A/C with warfarin, theraputic AoCKD3- improving - ARB held,  -Resumed home oral lasix 40mg  BID  He later failed attempts at restoring NSR with Dr. Rayann Heman. Ablation was done- see below.    Hehas hadbowel obstruction. He had some type of colon cleanse with relief.   He lost 15 lbs in 2017through portion control.   He had an AFib ablation and flutter ablation in 8/17.  He has chronic DOE.  Recently, his BP was low at times, so his Lasix was decreased on 03/31/18.  No change in weight or increase in [facility].  He has broken some ribs with a fall on the left chest.    Past Medical History:  Diagnosis Date  . Atrial fibrillation ([place])    persistent, previously seen at [facility] and placed on amiodarone  . Benign prostatic hypertrophy   . CAD (coronary artery disease)    multivessel s/p inferolateral wall MI with subsequent CABG 11/1998.  Cath 2009 with Patent grafts  . Cleft palate   . COPD with emphysema ([place]) 04/01/2010  . DM (diabetes mellitus), type 2 ([place])   . Dyspnea   . GERD (gastroesophageal reflux disease)   . HTN (hypertension)   . Hyperlipidemia   . Hypothyroidism   . Iron  deficiency anemia   . Ischemic dilated cardiomyopathy ([place])    EF 35-40% by MUGA 6/11  . Myocardial infarction ([place])   . Nasal septal deviation   . Nephrolithiasis   . OSA (obstructive sleep apnea)   . PAF (paroxysmal atrial fibrillation) ([place])   . Peripheral arterial disease ([facility])    left subclavian artery stenosis  . PNA (pneumonia)   . Psoriasis   . Seborrheic keratosis   . Stroke ([place])   . Systolic congestive heart failure ([place]) 2009   s/p BiV ICD implantation by Dr Leonia Reeves (MDT)    Past Surgical History:  Procedure Laterality Date  . BI-VENTRICULAR IMPLANTABLE CARDIOVERTER DEFIBRILLATOR  (CRT-D)  10-08-08; 11-06-2013   Dr Leonia Reeves (MDT) implant for primary prevention; gen change to MDT VivaXT CRTD by Dr Rayann Heman  . BIV ICD GENERTAOR CHANGE OUT N/A 11/06/2013   Procedure: BIV ICD GENERTAOR CHANGE OUT;  Surgeon: Coralyn Mark, MD;  Location: [facility] CATH LAB;  Service: Cardiovascular;  Laterality: N/A;  . c-spine surgery    . CARDIOVERSION N/A 04/29/2016   Procedure: CARDIOVERSION;  Surgeon: Pixie Casino, MD;  Location: [facility] ENDOSCOPY;  Service: Cardiovascular;  Laterality: N/A;  . CARPAL TUNNEL RELEASE    . CATARACT EXTRACTION    . CORONARY ARTERY BYPASS GRAFT     LIMA to LAD, SVG to OM,  SVG to diagonal  . ELECTROPHYSIOLOGIC STUDY N/A 06/11/2016   Procedure: Atrial Fibrillation Ablation;  Surgeon: Thompson Grayer, MD;  Location: [facility] CV LAB;  Service: Cardiovascular;  Laterality: N/A;  . left cleft palate and left cleft lip repair       Current Outpatient Medications  Medication Sig Dispense Refill  . acetaminophen (TYLENOL) 500 MG tablet Take 1,000 mg by mouth See admin instructions. Take 2 tablets (1000 mg) by mouth daily at bedtime, may also take 2 tablets every 6 hours as needed for pain/fever    . albuterol (PROVENTIL HFA;VENTOLIN HFA) 108 (90 BASE) MCG/ACT inhaler Inhale 2 puffs into the lungs every 6 (six) hours as needed for wheezing or shortness of breath. Must keep June  2016 appt. 1 Inhaler 0  . albuterol (PROVENTIL) (2.5 MG/3ML) 0.083% nebulizer solution USE 1 VIAL VIA NEBULIZER 3 TIMES A DAY AS DIRECTED. DX:J44.9 300 mL 3  . amiodarone (PACERONE) 400 MG tablet TAKE HALF TABLET (200 MG TOTAL) BY MOUTH DAILY. 30 tablet 9  . atorvastatin (LIPITOR) 80 MG tablet TAKE 1 TABLET (80 MG TOTAL) BY MOUTH DAILY. 90 tablet 2  . bisacodyl (DULCOLAX) 5 MG EC tablet Take 2 tablets (10 mg total) by mouth daily at 12 noon. 30 tablet 0  . carvedilol (COREG) 6.25 MG tablet TAKE 1 TABLET BY MOUTH TWICE A DAY 180 tablet 1  . ezetimibe (ZETIA) 10 MG tablet Take 10 mg by mouth daily.    . ferrous sulfate 325 (65 FE) MG tablet Take 325 mg by mouth daily with breakfast.    . fluticasone (FLONASE) 50 MCG/ACT nasal spray SPRAY 2 SPRAYS INTO EACH NOSTRIL EVERY DAY 16 g 2  . fluticasone furoate-vilanterol (BREO ELLIPTA) 100-25 MCG/INH AEPB Inhale 1 puff into the lungs daily. 60 each 5  . furosemide (LASIX) 40 MG tablet Take 40 mg by mouth 2 (two) times daily.    . insulin aspart (NOVOLOG FLEXPEN) 100 UNIT/ML FlexPen Inject 6 Units into the skin 3 (three) times daily before meals.    . insulin degludec (TRESIBA FLEXTOUCH) 100 UNIT/ML SOPN FlexTouch Pen Inject 0.3 mLs (30 Units total) into the skin daily before lunch. 1 pen 0  . levothyroxine (SYNTHROID, LEVOTHROID) 100 MCG tablet Take 100 mcg by mouth daily before breakfast.     . Multiple Vitamin (MULTIVITAMIN WITH MINERALS) TABS tablet Take 1 tablet by mouth daily.    [name] PRESCRIPTION MEDICATION Inhale into the lungs at bedtime. CPAP    . senna (SENOKOT) 8.6 MG tablet Take 2 tablets by mouth at bedtime.     [name] SPIRIVA RESPIMAT 2.5 MCG/ACT AERS INHALE 2 PUFFS INTO THE LUNGS DAILY. 1 Inhaler 1  . spironolactone (ALDACTONE) 25 MG tablet TAKE 1/2 TABLET (12.5 MG TOTAL) BY MOUTH DAILY. 45 tablet 1  . traMADol (ULTRAM) 50 MG tablet Take 1 tablet (50 mg total) by mouth every 6 (six) hours as needed for moderate pain. 30 tablet 0  . TRELEGY ELLIPTA  100-62.5-25 MCG/INH AEPB INHALE 1 PUFF INTO LUNGS ONCE A DAY 1 each 3  . vitamin B-12 (CYANOCOBALAMIN) 1000 MCG tablet Take 1,000 mcg by mouth daily.    [name] warfarin (COUMADIN) 5 MG tablet TAKE AS DIRECTED BY COUMADIN CLINIC 40 tablet 3   No current facility-administered medications for this visit.     Allergies:   Aldactone [spironolactone]    Social History:  The patient  reports that he quit smoking about 19 years ago. His smoking use included cigarettes. He has a 195.00  pack-year smoking history. He has never used smokeless tobacco. He reports that he does not drink alcohol or use drugs.   Family History:  The patient's family history includes Asthma in his father; Heart attack in his brother; Heart disease in his brother; Hypertension in his brother, father, mother, and sister; Stroke in his father.    ROS:  Please see the history of present illness.   Otherwise, review of systems are positive for LE edema.  Numbness in the right thumb, index finger. All other systems are reviewed and negative.    PHYSICAL EXAM: VS:  BP 122/64   Pulse 86   Ht 5\' 6"  (1.676 m)   Wt 211 lb (95.7 kg)   SpO2 96%   BMI 34.06 kg/m  , BMI Body mass index is 34.06 kg/m. GEN: Well nourished, well developed, in no acute distress  HEENT: normal  Neck: no JVD, carotid bruits, or masses Cardiac: RRR; no murmurs, rubs, or gallops,no edema  Respiratory:  clear to auscultation bilaterally, normal work of breathing GI: soft, nontender, nondistended, + BS MS: no deformity or atrophy  Skin: warm and dry, no rash Neuro:  Strength and sensation are intact Psych: euthymic mood, full affect    Recent Labs: 12/30/2017: ALT 10; TSH 5.190 03/13/2018: BUN 19; Creatinine, Ser 1.18; NT-Pro BNP 5,305; Potassium 3.9; Sodium 139   Lipid Panel    Component Value Date/Time   CHOL 137 11/12/2014 0921   TRIG 95 11/12/2014 0921   HDL 50 11/12/2014 0921   CHOLHDL 2.6 11/29/2008 0330   VLDL 10 11/29/2008 0330   LDLCALC  68 11/12/2014 0921     Other studies Reviewed: Additional studies/ records that were reviewed today with results demonstrating:labs reviewed.  LDL 65 in May 2019.   ASSESSMENT AND PLAN:  1. CAD: No angina on medical therapy.  Continue aggressive secondary prevention. 2. Hyperlipidemia: Lipids well controlled.  Continue current lipid-lowering therapy. 3. PAF: Intermittent atrial fibrillation noted on device checks. 4. Chronic systolic dysfunction/ ischemic cardiomyopathy: Appears euvolemic by most recent check.  Continue current dose of Lasix.  Okay to take additional 40 mg of Lasix up to twice a week as needed for increased shortness of breath, increased leg swelling or weight gain greater than 3 pounds.  Minimize salt intake.  He can occasionally use no-salt.  I cautioned him about the potassium content. 5. AICD checks with EP.: 6. PAD: Subclavian stenosis.  No weakness int he left arm.  Right hand sx may be carpal tunnel syndrome.    Current medicines are reviewed at length with the patient today.  The patient concerns regarding his medicines were addressed.  The following changes have been made:  No change  Labs/ tests ordered today include:  No orders of the defined types were placed in this encounter.   Recommend 150 minutes/week of aerobic exercise Low fat, low carb, high fiber diet recommended  Disposition:   FU in 6 months   Signed, Larae Grooms, MD  04/13/2018 8:58 AM    [facility] [address], [place], [place]  [address] Phone: [phone]; Fax: [phone]

## 2018-04-14 NOTE — Progress Notes (Signed)
Spoke with wife and she said patient received a good report from Dr Irish Lack yesterday.  He is doing well and no complaints.  Transmission reviewed and encouraged to call for any fluid symptoms.  Next ICM remote transmission is 05/04/2018.  No changes today

## 2018-04-28 LAB — CUP PACEART REMOTE DEVICE CHECK
Battery Remaining Longevity: 18 mo
Battery Voltage: 2.9 V
Brady Statistic AP VP Percent: 99.78 %
Brady Statistic AP VS Percent: 0.1 %
Brady Statistic AS VP Percent: 0.12 %
Brady Statistic AS VS Percent: 0 %
Brady Statistic RA Percent Paced: 99.85 %
Brady Statistic RV Percent Paced: 99.81 %
Date Time Interrogation Session: 20190621034344
HighPow Impedance: 50 Ohm
HighPow Impedance: 64 Ohm
Implantable Lead Implant Date: 20091215
Implantable Lead Implant Date: 20091215
Implantable Lead Implant Date: 20091215
Implantable Lead Location: 753858
Implantable Lead Location: 753859
Implantable Lead Location: 753860
Implantable Lead Model: 4196
Implantable Lead Model: 5076
Implantable Lead Model: 6947
Implantable Pulse Generator Implant Date: 20150113
Lead Channel Impedance Value: 4047 Ohm
Lead Channel Impedance Value: 456 Ohm
Lead Channel Impedance Value: 513 Ohm
Lead Channel Impedance Value: 551 Ohm
Lead Channel Impedance Value: 589 Ohm
Lead Channel Impedance Value: 950 Ohm
Lead Channel Pacing Threshold Amplitude: 0.875 V
Lead Channel Pacing Threshold Amplitude: 0.875 V
Lead Channel Pacing Threshold Amplitude: 1.375 V
Lead Channel Pacing Threshold Pulse Width: 0.4 ms
Lead Channel Pacing Threshold Pulse Width: 0.4 ms
Lead Channel Pacing Threshold Pulse Width: 0.6 ms
Lead Channel Sensing Intrinsic Amplitude: 0.25 mV
Lead Channel Sensing Intrinsic Amplitude: 0.25 mV
Lead Channel Sensing Intrinsic Amplitude: 15.125 mV
Lead Channel Sensing Intrinsic Amplitude: 15.125 mV
Lead Channel Setting Pacing Amplitude: 1.5 V
Lead Channel Setting Pacing Amplitude: 1.75 V
Lead Channel Setting Pacing Amplitude: 2.5 V
Lead Channel Setting Pacing Pulse Width: 0.6 ms
Lead Channel Setting Pacing Pulse Width: 1 ms
Lead Channel Setting Sensing Sensitivity: 0.3 mV

## 2018-05-02 ENCOUNTER — Other Ambulatory Visit: Payer: Self-pay | Admitting: Interventional Cardiology

## 2018-05-02 DIAGNOSIS — H353131 Nonexudative age-related macular degeneration, bilateral, early dry stage: Secondary | ICD-10-CM | POA: Diagnosis not present

## 2018-05-02 DIAGNOSIS — H26491 Other secondary cataract, right eye: Secondary | ICD-10-CM | POA: Diagnosis not present

## 2018-05-02 DIAGNOSIS — E119 Type 2 diabetes mellitus without complications: Secondary | ICD-10-CM | POA: Diagnosis not present

## 2018-05-02 DIAGNOSIS — Z961 Presence of intraocular lens: Secondary | ICD-10-CM | POA: Diagnosis not present

## 2018-05-02 DIAGNOSIS — H43811 Vitreous degeneration, right eye: Secondary | ICD-10-CM | POA: Diagnosis not present

## 2018-05-04 ENCOUNTER — Other Ambulatory Visit: Payer: Self-pay

## 2018-05-04 ENCOUNTER — Encounter (HOSPITAL_COMMUNITY): Payer: Self-pay | Admitting: Emergency Medicine

## 2018-05-04 ENCOUNTER — Emergency Department (HOSPITAL_COMMUNITY)
Admission: EM | Admit: 2018-05-04 | Discharge: 2018-05-05 | Disposition: A | Discharge status: Home / Self Care | Payer: PPO | Attending: Emergency Medicine | Admitting: Emergency Medicine

## 2018-05-04 ENCOUNTER — Telehealth: Payer: Self-pay

## 2018-05-04 ENCOUNTER — Ambulatory Visit (INDEPENDENT_AMBULATORY_CARE_PROVIDER_SITE_OTHER): Payer: PPO

## 2018-05-04 DIAGNOSIS — E119 Type 2 diabetes mellitus without complications: Secondary | ICD-10-CM | POA: Insufficient documentation

## 2018-05-04 DIAGNOSIS — Z7901 Long term (current) use of anticoagulants: Secondary | ICD-10-CM | POA: Insufficient documentation

## 2018-05-04 DIAGNOSIS — R1084 Generalized abdominal pain: Secondary | ICD-10-CM | POA: Diagnosis not present

## 2018-05-04 DIAGNOSIS — E039 Hypothyroidism, unspecified: Secondary | ICD-10-CM | POA: Insufficient documentation

## 2018-05-04 DIAGNOSIS — J449 Chronic obstructive pulmonary disease, unspecified: Secondary | ICD-10-CM | POA: Diagnosis not present

## 2018-05-04 DIAGNOSIS — I5022 Chronic systolic (congestive) heart failure: Secondary | ICD-10-CM | POA: Insufficient documentation

## 2018-05-04 DIAGNOSIS — Z87891 Personal history of nicotine dependence: Secondary | ICD-10-CM | POA: Diagnosis not present

## 2018-05-04 DIAGNOSIS — I2510 Atherosclerotic heart disease of native coronary artery without angina pectoris: Secondary | ICD-10-CM | POA: Diagnosis not present

## 2018-05-04 DIAGNOSIS — Z79899 Other long term (current) drug therapy: Secondary | ICD-10-CM | POA: Insufficient documentation

## 2018-05-04 DIAGNOSIS — I130 Hypertensive heart and chronic kidney disease with heart failure and stage 1 through stage 4 chronic kidney disease, or unspecified chronic kidney disease: Secondary | ICD-10-CM | POA: Insufficient documentation

## 2018-05-04 DIAGNOSIS — N183 Chronic kidney disease, stage 3 (moderate): Secondary | ICD-10-CM | POA: Diagnosis not present

## 2018-05-04 DIAGNOSIS — R1012 Left upper quadrant pain: Secondary | ICD-10-CM | POA: Diagnosis not present

## 2018-05-04 DIAGNOSIS — Z794 Long term (current) use of insulin: Secondary | ICD-10-CM | POA: Diagnosis not present

## 2018-05-04 DIAGNOSIS — Z95810 Presence of automatic (implantable) cardiac defibrillator: Secondary | ICD-10-CM | POA: Diagnosis not present

## 2018-05-04 DIAGNOSIS — R109 Unspecified abdominal pain: Secondary | ICD-10-CM

## 2018-05-04 DIAGNOSIS — I10 Essential (primary) hypertension: Secondary | ICD-10-CM | POA: Diagnosis not present

## 2018-05-04 LAB — CBC
HCT: 44 % (ref 39.0–52.0)
Hemoglobin: 13.9 g/dL (ref 13.0–17.0)
MCH: 30.2 pg (ref 26.0–34.0)
MCHC: 31.6 g/dL (ref 30.0–36.0)
MCV: 95.7 fL (ref 78.0–100.0)
Platelets: 184 10*3/uL (ref 150–400)
RBC: 4.6 MIL/uL (ref 4.22–5.81)
RDW: 14.6 % (ref 11.5–15.5)
WBC: 6.9 10*3/uL (ref 4.0–10.5)

## 2018-05-04 LAB — COMPREHENSIVE METABOLIC PANEL
ALT: 15 U/L (ref 0–44)
AST: 26 U/L (ref 15–41)
Albumin: 3.6 g/dL (ref 3.5–5.0)
Alkaline Phosphatase: 66 U/L (ref 38–126)
Anion gap: 11 (ref 5–15)
BUN: 26 mg/dL — ABNORMAL HIGH (ref 8–23)
CO2: 29 mmol/L (ref 22–32)
Calcium: 9.2 mg/dL (ref 8.9–10.3)
Chloride: 99 mmol/L (ref 98–111)
Creatinine, Ser: 1.76 mg/dL — ABNORMAL HIGH (ref 0.61–1.24)
GFR calc Af Amer: 40 mL/min — ABNORMAL LOW (ref >60)
GFR calc non Af Amer: 34 mL/min — ABNORMAL LOW (ref >60)
Glucose, Bld: 106 mg/dL — ABNORMAL HIGH (ref 70–99)
Potassium: 4.2 mmol/L (ref 3.5–5.1)
Sodium: 139 mmol/L (ref 135–145)
Total Bilirubin: 0.7 mg/dL (ref 0.3–1.2)
Total Protein: 6.9 g/dL (ref 6.5–8.1)

## 2018-05-04 LAB — URINALYSIS, ROUTINE W REFLEX MICROSCOPIC
Bacteria, UA: NONE SEEN
Bilirubin Urine: NEGATIVE
Glucose, UA: NEGATIVE mg/dL
Hgb urine dipstick: NEGATIVE
Ketones, ur: NEGATIVE mg/dL
Leukocytes, UA: NEGATIVE
Nitrite: NEGATIVE
Protein, ur: NEGATIVE mg/dL
Specific Gravity, Urine: 1.014 (ref 1.005–1.030)
pH: 5 (ref 5.0–8.0)

## 2018-05-04 LAB — LIPASE, BLOOD: Lipase: 34 U/L (ref 11–51)

## 2018-05-04 LAB — CBG MONITORING, ED: Glucose-Capillary: 106 mg/dL — ABNORMAL HIGH (ref 70–99)

## 2018-05-04 MED ORDER — FENTANYL CITRATE (PF) 100 MCG/2ML IJ SOLN
25.0000 ug | Freq: Once | INTRAMUSCULAR | Status: AC
  Administered 2018-05-04: 25 ug via INTRAVENOUS
  Filled 2018-05-04: qty 2

## 2018-05-04 NOTE — Progress Notes (Signed)
EPIC Encounter for ICM Monitoring  Patient Name: [name] is a 84 y.o. male Date: 05/04/2018 Primary Care Physican: [name], MD Primary [place] Electrophysiologist: Allred Dry Weight:Previous weight 207lbs  Since 13-Apr-2018 Time in AT/AF 0.0 hr/day (0.0%)       Attempted call to patient and unable to reach.  Left detailed message, per DPR, regarding transmission.  Transmission reviewed.    Thoracic impedance normal.  Prescribed dosage: Furosemide40 mgtake1tablettwice a day (decreased on6/04/2018)due to low BP.  Labs: 02/27/2018 Creatinine 1.23, BUN 16, Potassium 4.3, Sodium 139, EGFR 54-63, BNP 10,884 12/30/2017 Creatinine 1.24 BUN 15, Potassium 4.6, Sodium 139, EGFR 54-60 11/13/2017Creatinine 1.46, BUN 37, Potassium 4.2, Sodium 136, EGFR 44-51 11/12/2017Creatinine 1.33, BUN 30, Potassium 4.2, Sodium 137, EGFR 49-57  11/11/2017Creatinine 1.27, BUN 21, Potassium 4.1, Sodium 136, EGFR 52-60  11/10/2017Creatinine 1.48, BUN 21, Potassium 3.9, Sodium 135, EGFR 43-50  07/02/2016 Creatinine 1.69, BUN 23, Potassium 4.6, Sodium 140  06/23/2016 Creatinine 1.68, BUN 26, Potassium 4.9, Sodium 138  06/02/2016 Creatinine 1.28, BUN 27, Potassium 5.3, Sodium 139  Recommendations: Left voice mail with ICM number and encouraged to call if experiencing any fluid symptoms.  Follow-up plan: ICM clinic phone appointment on 06/29/2018.   Office appointment scheduled 05/25/2018 with Chanetta Marshall, NP.    Copy of ICM check sent to Dr. Rayann Heman.   3 month ICM trend: 05/04/2018    1 Year ICM trend:       Rosalene Billings, RN 05/04/2018 12:23 PM

## 2018-05-04 NOTE — ED Provider Notes (Signed)
[facility] EMERGENCY DEPARTMENT Provider Note   CSN: [id number] Arrival date & time: 05/04/18  1944     History   Chief Complaint Chief Complaint  Patient presents with  . Abdominal Pain    HPI [name] is a 84 y.o. male with a past medical history of CAD, atrial fibrillation, hyperlipidemia, prior MI, CHF, prior stroke who presents to ED for evaluation of acute onset of left middle quadrant abdominal pain described as a "stabbing sensation."  The pain began approximately 4 hours prior to my evaluation.  Patient was sitting in the living room when it began.  States that the last time he ate anything was approximately 7 hours ago.  No prior history of similar symptoms.  States that the pain has "eased up a little" since being here in the ED.  He has not taken any medicine help with the symptoms.  Reports normal bowel movement this morning.  Denies any nausea, vomiting, chest pain, fever, urinary symptoms, sick contacts with similar symptoms.  States he was diagnosed with a kidney stone 7 to 8 years ago.  Unsure if this feels similar.  HPI  Past Medical History:  Diagnosis Date  . Atrial fibrillation ([place])    persistent, previously seen at [facility] and placed on amiodarone  . Benign prostatic hypertrophy   . CAD (coronary artery disease)    multivessel s/p inferolateral wall MI with subsequent CABG 11/1998.  Cath 2009 with Patent grafts  . Cleft palate   . COPD with emphysema ([place]) 04/01/2010  . DM (diabetes mellitus), type 2 ([place])   . Dyspnea   . GERD (gastroesophageal reflux disease)   . HTN (hypertension)   . Hyperlipidemia   . Hypothyroidism   . Iron deficiency anemia   . Ischemic dilated cardiomyopathy ([place])    EF 35-40% by MUGA 6/11  . Myocardial infarction ([place])   . Nasal septal deviation   . Nephrolithiasis   . OSA (obstructive sleep apnea)   . PAF (paroxysmal atrial fibrillation) ([place])   . Peripheral arterial disease (H[facility])    left subclavian  artery stenosis  . PNA (pneumonia)   . Psoriasis   . Seborrheic keratosis   . Stroke ([place])   . Systolic congestive heart failure ([place]) 2009   s/p BiV ICD implantation by Dr Leonia Reeves (MDT)    Patient Active Problem List   Diagnosis Date Noted  . Community acquired pneumonia 09/03/2016  . Acute respiratory failure ([place]) 09/03/2016  . SOB (shortness of breath)   . A-fib ([place]) 06/11/2016  . Atrial fibrillation, persistent ([place])   . Allergic rhinitis 02/05/2016  . Long term (current) use of anticoagulants [Z79.01] 01/19/2016  . Chronic systolic heart failure ([place])   . Acute respiratory failure with hypoxia ([place]) 01/11/2016  . Acute on chronic systolic CHF (congestive heart failure), NYHA class 3 ([place]) 01/11/2016  . Demand ischemia ([place]) 01/11/2016  . Acute renal failure superimposed on stage 3 chronic kidney disease ([place]) 01/11/2016  . Diabetes mellitus with diabetic neuropathy, with long-term current use of insulin ([place]) 01/11/2016  . Morbid obesity due to excess calories ([place]) 01/11/2016  . COPD (chronic obstructive pulmonary disease) ([place]) 01/10/2016  . Coronary artery disease   . Ischemic dilated cardiomyopathy ([place])   . PAF (paroxysmal atrial fibrillation) ([place])   . OSA (obstructive sleep apnea)   . Peripheral vascular disease ([place]) 10/02/2013  . Long term current use of anticoagulant therapy 08/21/2013  . Essential hypertension 04/01/2010  . Automatic implantable cardioverter-defibrillator  in situ 03/31/2010    Past Surgical History:  Procedure Laterality Date  . BI-VENTRICULAR IMPLANTABLE CARDIOVERTER DEFIBRILLATOR  (CRT-D)  10-08-08; 11-06-2013   Dr Leonia Reeves (MDT) implant for primary prevention; gen change to MDT VivaXT CRTD by Dr Rayann Heman  . BIV ICD GENERTAOR CHANGE OUT N/A 11/06/2013   Procedure: BIV ICD GENERTAOR CHANGE OUT;  Surgeon: Coralyn Mark, MD;  Location[facility] LAB;  Service: Cardiovascular;  Laterality: N/A;  . c-spine surgery    . CARDIOVERSION N/A 04/29/2016    Procedure: CARDIOVERSION;  Surgeon: Pixie Casino, MD;  Location: [facility] ENDOSCOPY;  Service: Cardiovascular;  Laterality: N/A;  . CARPAL TUNNEL RELEASE    . CATARACT EXTRACTION    . CORONARY ARTERY BYPASS GRAFT     LIMA to LAD, SVG to OM, SVG to diagonal  . ELECTROPHYSIOLOGIC STUDY N/A 06/11/2016   Procedure: Atrial Fibrillation Ablation;  Surgeon: Thompson Grayer, MD;  Location: [facility] CV LAB;  Service: Cardiovascular;  Laterality: N/A;  . left cleft palate and left cleft lip repair          Home Medications    Prior to Admission medications   Medication Sig Start Date End Date Taking? Authorizing Provider  albuterol (PROVENTIL HFA;VENTOLIN HFA) 108 (90 BASE) MCG/ACT inhaler Inhale 2 puffs into the lungs every 6 (six) hours as needed for wheezing or shortness of breath. Must keep June 2016 appt. 03/04/15  Yes Chesley Mires, MD  albuterol (PROVENTIL) (2.5 MG/3ML) 0.083% nebulizer solution USE 1 VIAL VIA NEBULIZER 3 TIMES A DAY AS DIRECTED. DX:J44.9 12/15/17  Yes Chesley Mires, MD  amiodarone (PACERONE) 400 MG tablet TAKE HALF TABLET (200 MG TOTAL) BY MOUTH DAILY. 02/21/18  Yes Allred, Jeneen Rinks, MD  atorvastatin (LIPITOR) 80 MG tablet TAKE 1 TABLET (80 MG TOTAL) BY MOUTH DAILY. 10/17/17  Yes Jettie Booze, MD  carvedilol (COREG) 6.25 MG tablet TAKE 1 TABLET BY MOUTH TWICE A DAY 05/02/18  Yes Jettie Booze, MD  cholecalciferol (VITAMIN D) 1000 units tablet Take 2,000 Units by mouth every evening.   Yes [provider]  dextromethorphan-guaiFENesin (MUCINEX DM) 30-600 MG 12hr tablet Take 1 tablet by mouth 2 (two) times daily as needed for cough.   Yes [provider]  ezetimibe (ZETIA) 10 MG tablet Take 10 mg by mouth daily.   Yes [provider]  ferrous sulfate 325 (65 FE) MG tablet Take 325 mg by mouth every evening.    Yes [provider]  fluticasone (FLONASE) 50 MCG/ACT nasal spray SPRAY 2 SPRAYS INTO EACH NOSTRIL EVERY DAY 07/29/17  Yes Chesley Mires, MD  furosemide (LASIX) 40 MG tablet Take 80 mg by mouth 2 (two) times daily.    Yes [provider]  insulin aspart (NOVOLOG FLEXPEN) 100 UNIT/ML FlexPen Inject 4 Units into the skin 3 (three) times daily before meals.    Yes [provider]  insulin degludec (TRESIBA FLEXTOUCH) 100 UNIT/ML SOPN FlexTouch Pen Inject 0.3 mLs (30 Units total) into the skin daily before lunch. Patient taking differently: Inject 18 Units into the skin daily before lunch.  09/06/16  Yes Regalado, Belkys A, MD  levothyroxine (SYNTHROID, LEVOTHROID) 100 MCG tablet Take 100 mcg by mouth daily before breakfast.  09/23/15  Yes [provider]  Multiple Vitamins-Minerals (PRESERVISION AREDS PO) Take 1 tablet by mouth 2 (two) times daily.   Yes [provider]  senna (SENOKOT) 8.6 MG tablet Take 2 tablets by mouth at bedtime.    Yes [provider]  spironolactone (ALDACTONE) 25 MG tablet TAKE 1/2 TABLET (12.5 MG TOTAL) BY MOUTH DAILY. 12/08/17  Yes Jettie Booze, MD  TRELEGY ELLIPTA 100-62.5-25 MCG/INH AEPB INHALE 1 PUFF INTO LUNGS ONCE A DAY 02/27/18  Yes Chesley Mires, MD  vitamin B-12 (CYANOCOBALAMIN) 1000 MCG tablet Take 1,000 mcg by mouth daily.   Yes [provider]  warfarin (COUMADIN) 5 MG tablet Take 2.5-5 mg by mouth See admin instructions. Take 1/2 tablet on Tuesday, Thursday and Saturday then take 1 tablet all the other days   Yes [provider]  bisacodyl (DULCOLAX) 5 MG EC tablet Take 2 tablets (10 mg total) by mouth daily at 12 noon. Patient not taking: Reported on 05/04/2018 09/06/16   Regalado, Jerald Kief A, MD  fluticasone furoate-vilanterol (BREO ELLIPTA) 100-25 MCG/INH AEPB Inhale 1 puff into the lungs daily. Patient not taking: Reported on 05/04/2018 11/19/16   Chesley Mires, MD  PRESCRIPTION MEDICATION Inhale into the lungs at bedtime. CPAP    [provider]  SPIRIVA RESPIMAT 2.5 MCG/ACT AERS INHALE 2 PUFFS INTO THE LUNGS  DAILY. Patient not taking: Reported on 05/04/2018 04/11/17   Chesley Mires, MD  traMADol (ULTRAM) 50 MG tablet Take 1 tablet (50 mg total) by mouth every 6 (six) hours as needed for moderate pain. Patient not taking: Reported on 05/04/2018 09/06/16   Niel Hummer A, MD  warfarin (COUMADIN) 5 MG tablet TAKE AS DIRECTED BY COUMADIN CLINIC Patient not taking: Reported on 05/04/2018 03/31/18   Jettie Booze, MD    Family History Family History  Problem Relation Age of Onset  . Asthma Father   . Stroke Father   . Hypertension Father   . Hypertension Mother   . Heart disease Brother   . Heart attack Brother   . Hypertension Sister   . Hypertension Brother     Social History Social History   Tobacco Use  . Smoking status: Former Smoker    Packs/day: 3.00    Years: 65.00    Pack years: 195.00    Types: Cigarettes    Last attempt to quit: 10/25/1998    Years since quitting: 19.5  . Smokeless tobacco: Never Used  Substance Use Topics  . Alcohol use: No    Alcohol/week: 0.0 oz    Comment: remote history of heavy alcohol use  . Drug use: No     Allergies   Aldactone [spironolactone]   Review of Systems Review of Systems  Constitutional: Negative for appetite change, chills and fever.  HENT: Negative for ear pain, rhinorrhea, sneezing and sore throat.   Eyes: Negative for photophobia and visual disturbance.  Respiratory: Negative for cough, chest tightness, shortness of breath and wheezing.   Cardiovascular: Negative for chest pain and palpitations.  Gastrointestinal: Positive for abdominal pain. Negative for blood in stool, constipation, diarrhea, nausea and vomiting.  Genitourinary: Negative for dysuria, hematuria and urgency.  Musculoskeletal: Negative for myalgias.  Skin: Negative for rash.  Neurological: Negative for dizziness, weakness and light-headedness.     Physical Exam Updated Vital Signs BP 119/68   Pulse 69   Temp 98.5 F (36.9 C) (Oral)   Resp (!)  21   SpO2 91%   Physical Exam  Constitutional: He appears well-developed and well-nourished. No distress.  HENT:  Head: Normocephalic and atraumatic.  Nose: Nose normal.  Eyes: Conjunctivae and EOM are normal. Left eye exhibits no discharge. No scleral icterus.  Neck: Normal range of motion. Neck supple.  Cardiovascular: Normal rate, regular rhythm, normal heart sounds  and intact distal pulses. Exam reveals no gallop and no friction rub.  No murmur heard. Pulmonary/Chest: Effort normal and breath sounds normal. No respiratory distress.  Abdominal: Soft. Bowel sounds are normal. He exhibits no distension. There is tenderness. There is no guarding.    Musculoskeletal: Normal range of motion. He exhibits no edema.  Neurological: He is alert. He exhibits normal muscle tone. Coordination normal.  Skin: Skin is warm and dry. No rash noted.  Psychiatric: He has a normal mood and affect.  Nursing note and vitals reviewed.    ED Treatments / Results  Labs (all labs ordered are listed, but only abnormal results are displayed) Labs Reviewed  COMPREHENSIVE METABOLIC PANEL - Abnormal; Notable for the following components:      Result Value   Glucose, Bld 106 (*)    BUN 26 (*)    Creatinine, Ser 1.76 (*)    GFR calc non Af Amer 34 (*)    GFR calc Af Amer 40 (*)    All other components within normal limits  CBG MONITORING, ED - Abnormal; Notable for the following components:   Glucose-Capillary 106 (*)    All other components within normal limits  LIPASE, BLOOD  CBC  URINALYSIS, ROUTINE W REFLEX MICROSCOPIC  I-STAT CG4 LACTIC ACID, ED  I-STAT CG4 LACTIC ACID, ED    EKG EKG Interpretation  Date/Time:  Thursday May 04 2018 22:06:19 EDT Ventricular Rate:  70 PR Interval:    QRS Duration: 188 QT Interval:  508 QTC Calculation: 549 R Axis:   -93 Text Interpretation:  AV dual-paced rhythm Confirmed by Isla Pence [id number]) on 05/04/2018 10:36:05 PM   Radiology Ct Abdomen  Pelvis W Contrast  Result Date: 05/05/2018 CLINICAL DATA:  Left mid abdominal pain, sudden onset. EXAM: CT ABDOMEN AND PELVIS WITH CONTRAST TECHNIQUE: Multidetector CT imaging of the abdomen and pelvis was performed using the standard protocol following bolus administration of intravenous contrast. CONTRAST:  125mL OMNIPAQUE IOHEXOL 300 MG/ML  SOLN COMPARISON:  07/15/2016 FINDINGS: Lower chest: Chronic cardiomegaly. Biventricular pacer leads and changes of CABG. Chronic interstitial opacity at the bases attributed to scarring. There is also hazy ground-glass density that is likely atelectasis. Hepatobiliary: No focal liver abnormality.Calcified gallstone. No signs of obstruction or inflammation. Pancreas: Unremarkable. Spleen: Unremarkable. Adrenals/Urinary Tract: Negative adrenals. No hydronephrosis or stone. Symmetric renal atrophy. Unremarkable bladder. Stomach/Bowel: No obstruction. No inflammatory changes. Mild distal colonic diverticulosis. Vascular/Lymphatic: Diffuse atherosclerotic calcification. Plaque causes moderate narrowing of the proximal SMA. Small celiac trunk in the setting of replaced right hepatic artery, with likely mild atheromatous narrowing. No acute vascular finding. Reproductive:Enlarged prostate. Other: No ascites or pneumoperitoneum. Musculoskeletal: Remote L1 compression fracture with progressive height loss since prior. Remote and healing left eighth rib fracture. Multiple remote right-sided rib fractures. Advanced lumbar disc and facet degeneration. No acute finding IMPRESSION: 1. No acute finding to explain symptoms. 2.  Aortic Atherosclerosis (ICD10-I70.0). 3. Cholelithiasis. 4. Mild colonic diverticulosis. Electronically Signed   By: Monte Fantasia M.D.   On: 05/05/2018 00:48    Procedures Procedures (including critical care time)  Medications Ordered in ED Medications  fentaNYL (SUBLIMAZE) injection 25 mcg (25 mcg Intravenous Given 05/04/18 2349)  iohexol (OMNIPAQUE) 300  MG/ML solution 100 mL (100 mLs Intravenous Contrast Given 05/05/18 0009)     Initial Impression / Assessment and Plan / ED Course  I have reviewed the triage vital signs and the nursing notes.  Pertinent labs & imaging results that were available during my care of the  patient were reviewed by me and considered in my medical decision making (see chart for details).     84 year old male with past medical history of atrial fibrillation, prior MI, CHF, prior stroke presents for acute onset of left middle abdominal pain, flank pain described as a "stabbing sensation."  Pain began approximately 4 hours prior to my evaluation.  Sitting in the living room when it began.  Last time he had anything to eat was 7 hours prior to that.  States that pain has improved since arrival in the ED.  States that he was diagnosed with a kidney stone 7 to 8 years ago and is unsure if this feels similar.  He does state that he did not have pain when he was diagnosed with a kidney stone.  Denies any nausea, vomiting, chest pain, shortness of breath, urinary symptoms, fever, bowel changes.  On physical exam he has left-sided abdominal tenderness to palpation with no rebound or guarding noted.  He is afebrile.  CBC unremarkable.  CMP with elevation creatinine consistent with his history of stage III CKD.  Urinalysis is unremarkable.  EKG showed paced rhythm with no ischemic changes.  CT of the abdomen and pelvis is negative for acute abnormality.  Lactate is negative.  No signs of ischemia noted.  Reassured patient of his negative work-up and imaging today.  He reports significant improvement in his symptoms.  Tolerating p.o. intake without difficulty prior to discharge.  Advised to return to ED for any severe worsening symptoms.  Portions of this note were generated with [employer]. Dictation errors may occur despite best attempts at proofreading.  Final Clinical Impressions(s) / ED Diagnoses   Final diagnoses:   Abdominal wall pain    ED Discharge Orders    None       Delia Heady, PA-C 05/05/18 0103    Isla Pence, MD 05/09/18 [phone]

## 2018-05-04 NOTE — ED Triage Notes (Signed)
Pt from home. Tonight had sudden onset of left sided stabbing abdominal pain, non radiating, no n/v/d.  Began about 1815 tonight.

## 2018-05-04 NOTE — Telephone Encounter (Signed)
Remote ICM transmission received.  Attempted call to patient and left detailed message, per DPR, regarding transmission and next ICM scheduled for 06/29/2018.  Advised to return call for any fluid symptoms or questions.    

## 2018-05-05 ENCOUNTER — Emergency Department (HOSPITAL_COMMUNITY): Payer: PPO

## 2018-05-05 DIAGNOSIS — R109 Unspecified abdominal pain: Secondary | ICD-10-CM | POA: Diagnosis not present

## 2018-05-05 LAB — I-STAT CG4 LACTIC ACID, ED: Lactic Acid, Venous: 0.86 mmol/L (ref 0.5–1.9)

## 2018-05-05 MED ORDER — IOHEXOL 300 MG/ML  SOLN
100.0000 mL | Freq: Once | INTRAMUSCULAR | Status: AC | PRN
  Administered 2018-05-05: 100 mL via INTRAVENOUS

## 2018-05-05 NOTE — Discharge Instructions (Addendum)
The CT scan of your abdomen today was negative. Please follow with your primary care provider as directed. Continue your home medications as previously prescribed. Return to ED for any worsening symptoms including severe abdominal pain, chest pain, trouble breathing or trouble swallowing, fevers, lightheadedness or loss of consciousness.

## 2018-05-08 ENCOUNTER — Encounter: Payer: Self-pay | Admitting: Pulmonary Disease

## 2018-05-08 ENCOUNTER — Ambulatory Visit: Payer: PPO | Admitting: Pulmonary Disease

## 2018-05-08 VITALS — BP 118/70 | HR 73 | Ht 68.0 in | Wt 209.0 lb

## 2018-05-08 DIAGNOSIS — J432 Centrilobular emphysema: Secondary | ICD-10-CM | POA: Diagnosis not present

## 2018-05-08 DIAGNOSIS — Z9989 Dependence on other enabling machines and devices: Secondary | ICD-10-CM

## 2018-05-08 DIAGNOSIS — G4733 Obstructive sleep apnea (adult) (pediatric): Secondary | ICD-10-CM | POA: Diagnosis not present

## 2018-05-08 DIAGNOSIS — J449 Chronic obstructive pulmonary disease, unspecified: Secondary | ICD-10-CM | POA: Diagnosis not present

## 2018-05-08 NOTE — Progress Notes (Signed)
[facility]  Chief Complaint  Patient presents with  . Follow-up    pt having issues with mask fitting and mask leaks with cpap machine.    Constitutional: BP 118/70 (BP Location: Left Arm, Cuff Size: Normal)   Pulse 73   Ht 5\' 8"  (1.727 m)   Wt 209 lb (94.8 kg)   SpO2 96%   BMI 31.78 kg/m   History of Present Illness: [name] is a 84 y.o. male former smoker with COPD/emphysema, and OSA.  He still has trouble using CPAP.  He has full face mask.  Size was changed, but still leaks.  This wakes him up, and then he has to take mask off.  His sleep is better when he can use CPAP more.  He is not having cough, wheeze, sputum, hemoptysis, or chest pain.  Gets more short of breath in hot weather.  Not having leg swelling.   Comprehensive Respiratory Exam:  Appearance - well kempt  ENMT - nasal voice, MP 2, decreased AP diameter, changes of cleft lip Neck - no masses, trachea midline, no thyromegaly, no elevation in JVP Respiratory - normal appearance of chest wall, normal respiratory effort w/o accessory muscle use, no dullness on percussion, no wheezing or rales CV - s1s2 regular rate and rhythm, no murmurs, no peripheral edema, radial pulses symmetric GI - soft, non tender, no masses, no hepatosplenomegaly Lymph - no adenopathy noted in neck and axillary areas MSK - normal muscle strength and tone, uses as walker Ext - no cyanosis, clubbing, or joint inflammation noted Skin - no rashes, lesions, or ulcers Neuro - oriented to person, place, and time Psych - normal mood and affect   Assessment/Plan: COPD with emphysema. - improved with change to trelegy - prn albuterol  Obstructive sleep apnea. - he is still having trouble with CPAP even though it is helpful - main issue is mask fit with air leak - will change from CPAP 16 to 14 cm H2O - if still has trouble, then will need to try different mask set up (nasal mask with  chin strap)   Patient Instructions  Will change your CPAP to 14 cm H2O  Call if you are still having trouble with CPAP after pressure change  Follow up in 1 year    [name] Mires, MD [place] 05/08/2018, 4:35 PM  Flow Sheet  Pulmonary tests: PFT 03/30/11>>FEV1 1.98(72%), FEV1% 68, DLCO 64%  PFT 04/19/12>>FEV1 1.65 (67%), FEV1% 63, TLC 5.66 (99%), DLCO 71%, no BD. PFT 12/19/13 >> FEV1 2.03 (77%), FEV1% 72, TLC 6.45 (98%), DLCO 49% CT chest 01/18/14 >> mild centrilobular and paraseptal emphysema, mild fibrotic changes Rt periphery CT chest 01/15/16 >> emphysema  Cardiac tests Echo 01/11/16   Sleep tests: PSG 06/14/06>>AHI 35.5  Auto CPAP 05/19/16 to 06/17/16 >>used on 28 to 30 nights with average 6 hrs 57 min. Average AHI 18.7 with median CPAP 8 and 95 th percentile CPAP 11 cm H2O. CPAP titration 06/23/16 >> CPAP 16 cm H2O >> AHI 6.7 CPAP 02/11/17 to 05/11/17 >> used on 76 of 90 nights with average 6 hrs 4 min.  Average AHI 12 with CPAP 16 cm H2O.  Air leak  Past Medical History: He  has a past medical history of Atrial fibrillation ([place]), Benign prostatic hypertrophy, CAD (coronary artery disease), Cleft palate, COPD with emphysema ([place]) (04/01/2010), DM (diabetes mellitus), type 2 ([place]), Dyspnea, GERD (gastroesophageal reflux disease), HTN (hypertension), Hyperlipidemia, Hypothyroidism, Iron deficiency anemia,  Ischemic dilated cardiomyopathy ([place]), Myocardial infarction ([place]), Nasal septal deviation, Nephrolithiasis, OSA (obstructive sleep apnea), PAF (paroxysmal atrial fibrillation) ([place]), Peripheral arterial disease ([place]), PNA (pneumonia), Psoriasis, Seborrheic keratosis, Stroke ([place]), and Systolic congestive heart failure ([place]) (2009).  Past Surgical History: He  has a past surgical history that includes Carpal tunnel release; left cleft palate and left cleft lip repair; Cataract extraction; c-spine surgery; Bi-ventricular implantable cardioverter defibrillator   (crt-d) (10-08-08; 11-06-2013); Coronary artery bypass graft; Biv icd genertaor change out (N/A, 11/06/2013); Cardioversion (N/A, 04/29/2016); and Cardiac catheterization (N/A, 06/11/2016).  Family History: His family history includes Asthma in his father; Heart attack in his brother; Heart disease in his brother; Hypertension in his brother, father, mother, and sister; Stroke in his father.  Social History: He  reports that he quit smoking about 19 years ago. His smoking use included cigarettes. He has a 195.00 pack-year smoking history. He has never used smokeless tobacco. He reports that he does not drink alcohol or use drugs.  Medications: Allergies as of 05/08/2018      Reactions   Aldactone [spironolactone] Other (See Comments)   Hyperkalemia  reported by Dr. [name] - pt is currently taking 12.5 mg daily -November 2017 per medication list by same MD      Medication List        Accurate as of 05/08/18  4:35 PM. Always use your most recent med list.          albuterol 108 (90 Base) MCG/ACT inhaler Commonly known as:  PROVENTIL HFA;VENTOLIN HFA Inhale 2 puffs into the lungs every 6 (six) hours as needed for wheezing or shortness of breath. Must keep June 2016 appt.   albuterol (2.5 MG/3ML) 0.083% nebulizer solution Commonly known as:  PROVENTIL USE 1 VIAL VIA NEBULIZER 3 TIMES A DAY AS DIRECTED. DX:J44.9   amiodarone 400 MG tablet Commonly known as:  PACERONE TAKE HALF TABLET (200 MG TOTAL) BY MOUTH DAILY.   atorvastatin 80 MG tablet Commonly known as:  LIPITOR TAKE 1 TABLET (80 MG TOTAL) BY MOUTH DAILY.   bisacodyl 5 MG EC tablet Commonly known as:  DULCOLAX Take 2 tablets (10 mg total) by mouth daily at 12 noon.   carvedilol 6.25 MG tablet Commonly known as:  COREG TAKE 1 TABLET BY MOUTH TWICE A DAY   cholecalciferol 1000 units tablet Commonly known as:  VITAMIN D Take 2,000 Units by mouth every evening.   dextromethorphan-guaiFENesin 30-600 MG 12hr  tablet Commonly known as:  MUCINEX DM Take 1 tablet by mouth 2 (two) times daily as needed for cough.   ferrous sulfate 325 (65 FE) MG tablet Take 325 mg by mouth every evening.   fluticasone 50 MCG/ACT nasal spray Commonly known as:  FLONASE SPRAY 2 SPRAYS INTO EACH NOSTRIL EVERY DAY   furosemide 40 MG tablet Commonly known as:  LASIX Take 80 mg by mouth 2 (two) times daily.   insulin degludec 100 UNIT/ML Sopn FlexTouch Pen Commonly known as:  TRESIBA FLEXTOUCH Inject 0.3 mLs (30 Units total) into the skin daily before lunch.   levothyroxine 100 MCG tablet Commonly known as:  SYNTHROID, LEVOTHROID Take 100 mcg by mouth daily before breakfast.   NOVOLOG FLEXPEN 100 UNIT/ML FlexPen Generic drug:  insulin aspart Inject 4 Units into the skin 3 (three) times daily before meals.   PRESCRIPTION MEDICATION Inhale into the lungs at bedtime. CPAP   PRESERVISION AREDS PO Take 1 tablet by mouth 2 (two) times daily.   SENOKOT 8.6 MG tablet Generic  drug:  senna Take 2 tablets by mouth at bedtime.   SPIRIVA RESPIMAT 2.5 MCG/ACT Aers Generic drug:  Tiotropium Bromide Monohydrate INHALE 2 PUFFS INTO THE LUNGS DAILY.   spironolactone 25 MG tablet Commonly known as:  ALDACTONE TAKE 1/2 TABLET (12.5 MG TOTAL) BY MOUTH DAILY.   TRELEGY ELLIPTA 100-62.5-25 MCG/INH Aepb Generic drug:  Fluticasone-Umeclidin-Vilant INHALE 1 PUFF INTO LUNGS ONCE A DAY   vitamin B-12 1000 MCG tablet Commonly known as:  CYANOCOBALAMIN Take 1,000 mcg by mouth daily.   warfarin 5 MG tablet Commonly known as:  COUMADIN Take as directed by the anticoagulation clinic. If you are unsure how to take this medication, talk to your nurse or doctor. Original instructions:  Take 2.5-5 mg by mouth See admin instructions. Take 1/2 tablet on Tuesday, Thursday and Saturday then take 1 tablet all the other days   warfarin 5 MG tablet Commonly known as:  COUMADIN Take as directed by the anticoagulation clinic. If you  are unsure how to take this medication, talk to your nurse or doctor. Original instructions:  TAKE AS DIRECTED BY COUMADIN CLINIC   ZETIA 10 MG tablet Generic drug:  ezetimibe Take 10 mg by mouth daily.

## 2018-05-08 NOTE — Patient Instructions (Signed)
Will change your CPAP to 14 cm H2O  Call if you are still having trouble with CPAP after pressure change  Follow up in 1 year

## 2018-05-11 ENCOUNTER — Other Ambulatory Visit: Payer: Self-pay

## 2018-05-11 ENCOUNTER — Encounter: Payer: PPO | Admitting: Nurse Practitioner

## 2018-05-11 ENCOUNTER — Ambulatory Visit (INDEPENDENT_AMBULATORY_CARE_PROVIDER_SITE_OTHER): Payer: PPO | Admitting: Pharmacist

## 2018-05-11 DIAGNOSIS — I481 Persistent atrial fibrillation: Secondary | ICD-10-CM

## 2018-05-11 DIAGNOSIS — Z7901 Long term (current) use of anticoagulants: Secondary | ICD-10-CM | POA: Diagnosis not present

## 2018-05-11 DIAGNOSIS — I480 Paroxysmal atrial fibrillation: Secondary | ICD-10-CM | POA: Diagnosis not present

## 2018-05-11 DIAGNOSIS — Z5181 Encounter for therapeutic drug level monitoring: Secondary | ICD-10-CM

## 2018-05-11 DIAGNOSIS — J3489 Other specified disorders of nose and nasal sinuses: Secondary | ICD-10-CM | POA: Diagnosis not present

## 2018-05-11 DIAGNOSIS — M545 Low back pain: Secondary | ICD-10-CM | POA: Diagnosis not present

## 2018-05-11 DIAGNOSIS — I4819 Other persistent atrial fibrillation: Secondary | ICD-10-CM

## 2018-05-11 LAB — POCT INR: INR: 1.9 — AB (ref 2.0–3.0)

## 2018-05-11 NOTE — Patient Instructions (Signed)
Description   Take 1.5 tablets today then Continue same dose of  coumadin   1 tablet everyday except take 1/2 tablet on  Wednesdays and  Saturdays.   Continue eating 2-3 servings of leafy green vegetable each week.  Recheck in 3 weeks.  Call [phone] with any new medications.

## 2018-05-11 NOTE — Patient Outreach (Signed)
[facility]) Care Management  05/11/2018  OLUWADAMILARE TOBLER 17-Feb-1934 [id number]   TELEPHONE SCREENING Referral date: 05/08/18 Referral source: utilization management  Referral reason: medication assistance Insurance: health team advantage.  Attempt #1  Telephone call to patient regarding utilization management referral. Unable to reach patient. HIPAA compliant voice message left with call back phone number.   PLAN: RNCM will attempt 2nd telephone call to patient within 4 business days. RNCM will send outreach letter.   Quinn Plowman RN,BSN,CCM [facility] Telephonic  [phone]

## 2018-05-15 ENCOUNTER — Other Ambulatory Visit: Payer: Self-pay

## 2018-05-15 NOTE — Patient Outreach (Signed)
[facility]) Care Management  05/15/2018  TRENTAN TRIPPE 18-Feb-1934 [id number]  TELEPHONE SCREENING Referral date: 05/08/18 Referral source: utilization management  Referral reason: medication assistance Insurance: health team advantage.    Telephone call to patient regarding utilization management referral. HIPAA verified with patients wife and designated party release, [place].  Explained reason for call. Wife states patients medications are a little high.  RNCM discussed and offered [facility] care management services. Wife states patient is managing at this time and has the medications he needs.  Wife requested to call [facility] care management back if she or patient feels like it is needed. Wife refused services at this time.   PLAN: RNCM will close patient due to refusal of services.  RNCM will send patient [facility] care management brochure/ magnet RNCM will send patients primary MD close notification   Quinn Plowman RN,BSN,CCM [facility] Telephonic  [phone]

## 2018-05-20 ENCOUNTER — Other Ambulatory Visit: Payer: Self-pay | Admitting: Pulmonary Disease

## 2018-05-25 ENCOUNTER — Ambulatory Visit (INDEPENDENT_AMBULATORY_CARE_PROVIDER_SITE_OTHER): Payer: PPO | Admitting: *Deleted

## 2018-05-25 ENCOUNTER — Encounter: Payer: PPO | Admitting: Nurse Practitioner

## 2018-05-25 DIAGNOSIS — Z5181 Encounter for therapeutic drug level monitoring: Secondary | ICD-10-CM | POA: Diagnosis not present

## 2018-05-25 DIAGNOSIS — Z7901 Long term (current) use of anticoagulants: Secondary | ICD-10-CM

## 2018-05-25 DIAGNOSIS — I4819 Other persistent atrial fibrillation: Secondary | ICD-10-CM

## 2018-05-25 DIAGNOSIS — I480 Paroxysmal atrial fibrillation: Secondary | ICD-10-CM

## 2018-05-25 LAB — POCT INR: INR: 1.4 — AB (ref 2.0–3.0)

## 2018-05-25 NOTE — Patient Instructions (Signed)
Description   Take 1.5 tablets today then change your dose to 1 tablet everyday except 1/2 tablet on Wednesdays.  Continue eating 2-3 servings of leafy green vegetable each week.  Recheck in 11 days.   Call [phone] with any new medications.

## 2018-05-31 ENCOUNTER — Other Ambulatory Visit: Payer: Self-pay

## 2018-05-31 DIAGNOSIS — S32010A Wedge compression fracture of first lumbar vertebra, initial encounter for closed fracture: Secondary | ICD-10-CM | POA: Diagnosis not present

## 2018-05-31 NOTE — Patient Outreach (Signed)
[facility]) Care Management  05/31/2018  [name] 04/15/1934 [id number]  . Nurse Call Line Referral Date: 05/31/18 Reason for Referral: severe back pain on left and right side.  Attempt #1  Telephone call to patient regarding nurse call line referral. Unable to reach patient. HIPAA compliant voice message left with call back phone number.   PLAN: RNCM will attempt 2nd telephone call to patient within 4 business days. RNCM will send outreach letter.   Quinn Plowman RN,BSN,CCM [facility] Telephonic  [phone]

## 2018-06-01 ENCOUNTER — Other Ambulatory Visit: Payer: Self-pay | Admitting: Interventional Cardiology

## 2018-06-02 ENCOUNTER — Other Ambulatory Visit: Payer: Self-pay

## 2018-06-02 NOTE — Patient Outreach (Signed)
[facility]) Care Management  06/02/2018  [name] Oct 29, 1933 [id number]   Nurse Call Line Referral Date: 05/31/18 Reason for Referral: severe back pain on left and right side.   Telephone call to patient regarding nurse advise line referral. HIPAA verified with patient.  Explained reason for call. Patient states he is hard of hearing and request RNCM speak with his wife [name].  Wife listed in chart as patients designated party release.  Explained reason for call to wife. Wife states patient saw his primary MD as recommended by the nurse advised line nurse.  Wife states patient was found to have a fractured vertebra at Lumbar 1 area of back. She states patients primary MD prescribed patient pain medication tramadol which seem to be helping with patients pain.  Wife states patients primary MD also referred patient to specialist Dr. Ernestina Patches.  Unable to schedule appointment with Dr, Ernestina Patches at this time due to doctor being out of town.  Wife states nurse will call on Monday to schedule appointment.  RNCM discussed and offered [facility] Care management services.  Wife declined services. Wife states patient will continue to take pain medication and follow up with specialist.   PLAN; RNCM will close patient due to refusal of services.  RNCM will send closure notification to patients primary MD.  [facility] will send patient [facility] brochure/ magnet  Quinn Plowman RN,BSN,CCM [facility] Telephonic  [phone]

## 2018-06-05 ENCOUNTER — Ambulatory Visit (INDEPENDENT_AMBULATORY_CARE_PROVIDER_SITE_OTHER): Payer: PPO

## 2018-06-05 ENCOUNTER — Telehealth (INDEPENDENT_AMBULATORY_CARE_PROVIDER_SITE_OTHER): Payer: Self-pay | Admitting: *Deleted

## 2018-06-05 DIAGNOSIS — I481 Persistent atrial fibrillation: Secondary | ICD-10-CM

## 2018-06-05 DIAGNOSIS — Z5181 Encounter for therapeutic drug level monitoring: Secondary | ICD-10-CM

## 2018-06-05 DIAGNOSIS — I480 Paroxysmal atrial fibrillation: Secondary | ICD-10-CM | POA: Diagnosis not present

## 2018-06-05 DIAGNOSIS — Z7901 Long term (current) use of anticoagulants: Secondary | ICD-10-CM

## 2018-06-05 DIAGNOSIS — I4819 Other persistent atrial fibrillation: Secondary | ICD-10-CM

## 2018-06-05 LAB — POCT INR: INR: 3 (ref 2.0–3.0)

## 2018-06-05 NOTE — Telephone Encounter (Signed)
Pt is scheduled for 06/20/18, but will call Dr. Durward Fortes office to see if they have availability prior to 06/20/18.

## 2018-06-05 NOTE — Telephone Encounter (Signed)
[place] for OV with me or Dr. Durward Fortes. New CT scan indicated old or remote L1 compression fracture, FYI

## 2018-06-05 NOTE — Patient Instructions (Signed)
Description   Continue on same dosage 1 tablet everyday except 1/2 tablet on Wednesdays.  Continue eating 2-3 servings of leafy green vegetable each week.  Recheck in 3 weeks.   Call [phone] with any new medications.

## 2018-06-08 ENCOUNTER — Ambulatory Visit (INDEPENDENT_AMBULATORY_CARE_PROVIDER_SITE_OTHER): Payer: Self-pay | Admitting: Orthopaedic Surgery

## 2018-06-12 DIAGNOSIS — I5022 Chronic systolic (congestive) heart failure: Secondary | ICD-10-CM | POA: Diagnosis not present

## 2018-06-12 DIAGNOSIS — E1142 Type 2 diabetes mellitus with diabetic polyneuropathy: Secondary | ICD-10-CM | POA: Diagnosis not present

## 2018-06-12 DIAGNOSIS — Z794 Long term (current) use of insulin: Secondary | ICD-10-CM | POA: Diagnosis not present

## 2018-06-12 DIAGNOSIS — I10 Essential (primary) hypertension: Secondary | ICD-10-CM | POA: Diagnosis not present

## 2018-06-12 DIAGNOSIS — S32010A Wedge compression fracture of first lumbar vertebra, initial encounter for closed fracture: Secondary | ICD-10-CM | POA: Diagnosis not present

## 2018-06-20 ENCOUNTER — Ambulatory Visit (INDEPENDENT_AMBULATORY_CARE_PROVIDER_SITE_OTHER): Payer: PPO | Admitting: Physical Medicine and Rehabilitation

## 2018-06-20 ENCOUNTER — Encounter (INDEPENDENT_AMBULATORY_CARE_PROVIDER_SITE_OTHER): Payer: Self-pay | Admitting: Physical Medicine and Rehabilitation

## 2018-06-20 ENCOUNTER — Other Ambulatory Visit (HOSPITAL_COMMUNITY): Payer: Self-pay | Admitting: Nurse Practitioner

## 2018-06-20 VITALS — BP 131/69 | HR 70 | Temp 97.7°F | Ht 68.0 in | Wt 196.0 lb

## 2018-06-20 DIAGNOSIS — S32010A Wedge compression fracture of first lumbar vertebra, initial encounter for closed fracture: Secondary | ICD-10-CM

## 2018-06-20 DIAGNOSIS — M545 Low back pain, unspecified: Secondary | ICD-10-CM

## 2018-06-20 DIAGNOSIS — G8929 Other chronic pain: Secondary | ICD-10-CM | POA: Diagnosis not present

## 2018-06-20 DIAGNOSIS — Z7901 Long term (current) use of anticoagulants: Secondary | ICD-10-CM

## 2018-06-20 NOTE — Progress Notes (Signed)
Electrophysiology Office Note Date: 06/22/2018  ID:  [name], DOB Oct 06, 1934, MRN [id number]  PCP: [name], MD Primary Cardiologist: Irish Lack Electrophysiologist: [place]  CC: Routine ICD follow-up  [name] is a 84 y.o. male seen today for Dr Rayann Heman.  He presents today for routine electrophysiology followup.  Since last being seen in our clinic, the patient reports doing relatively well.  His biggest issue today is with back pain.  He is seeing orthopedics and is scheduled for a steroid injection tomorrow.  He denies chest pain, palpitations, dyspnea (above baseline), PND, orthopnea, nausea, vomiting, dizziness, syncope, edema, weight gain, or early satiety.  He has not had ICD shocks.   Device History: MDT CRTD implanted 2009 for ICM, CHF History of appropriate therapy: No History of AAD therapy: yes - amiodarone for AF   Past Medical History:  Diagnosis Date  . Atrial fibrillation ([place])    persistent, previously seen at [facility] and placed on amiodarone  . Benign prostatic hypertrophy   . CAD (coronary artery disease)    multivessel s/p inferolateral wall MI with subsequent CABG 11/1998.  Cath 2009 with Patent grafts  . Cleft palate   . COPD with emphysema ([place]) 04/01/2010  . DM (diabetes mellitus), type 2 ([name])   . Dyspnea   . GERD (gastroesophageal reflux disease)   . HTN (hypertension)   . Hyperlipidemia   . Hypothyroidism   . Iron deficiency anemia   . Ischemic dilated cardiomyopathy ([name])    EF 35-40% by MUGA 6/11  . Myocardial infarction ([name])   . Nasal septal deviation   . Nephrolithiasis   . OSA (obstructive sleep apnea)   . PAF (paroxysmal atrial fibrillation) ([place])   . Peripheral arterial disease ([facility])    left subclavian artery stenosis  . PNA (pneumonia)   . Psoriasis   . Seborrheic keratosis   . Stroke ([name])   . Systolic congestive heart failure ([name]) 2009   s/p BiV ICD implantation by Dr Leonia Reeves (MDT)   Past Surgical History:    Procedure Laterality Date  . BI-VENTRICULAR IMPLANTABLE CARDIOVERTER DEFIBRILLATOR  (CRT-D)  10-08-08; 11-06-2013   Dr Leonia Reeves (MDT) implant for primary prevention; gen change to MDT VivaXT CRTD by Dr Rayann Heman  . BIV ICD GENERTAOR CHANGE OUT N/A 11/06/2013   Procedure: BIV ICD GENERTAOR CHANGE OUT;  Surgeon: Coralyn Mark, MD;  Location: [facility] CATH LAB;  Service: Cardiovascular;  Laterality: N/A;  . c-spine surgery    . CARDIOVERSION N/A 04/29/2016   Procedure: CARDIOVERSION;  Surgeon: Pixie Casino, MD;  Location: [facility] ENDOSCOPY;  Service: Cardiovascular;  Laterality: N/A;  . CARPAL TUNNEL RELEASE    . CATARACT EXTRACTION    . CORONARY ARTERY BYPASS GRAFT     LIMA to LAD, SVG to OM, SVG to diagonal  . ELECTROPHYSIOLOGIC STUDY N/A 06/11/2016   Procedure: Atrial Fibrillation Ablation;  Surgeon: Thompson Grayer, MD;  Location: [place] CV LAB;  Service: Cardiovascular;  Laterality: N/A;  . left cleft palate and left cleft lip repair      Current Outpatient Medications  Medication Sig Dispense Refill  . albuterol (PROVENTIL HFA;VENTOLIN HFA) 108 (90 BASE) MCG/ACT inhaler Inhale 2 puffs into the lungs every 6 (six) hours as needed for wheezing or shortness of breath. Must keep June 2016 appt. 1 Inhaler 0  . albuterol (PROVENTIL) (2.5 MG/3ML) 0.083% nebulizer solution USE 1 VIAL VIA NEBULIZER 3 TIMES A DAY AS DIRECTED. DX:J44.9 300 mL 3  . amiodarone (PACERONE) 400 MG  tablet TAKE HALF TABLET (200 MG TOTAL) BY MOUTH DAILY. 30 tablet 9  . atorvastatin (LIPITOR) 80 MG tablet TAKE 1 TABLET (80 MG TOTAL) BY MOUTH DAILY. 90 tablet 2  . bisacodyl (DULCOLAX) 5 MG EC tablet Take 2 tablets (10 mg total) by mouth daily at 12 noon. 30 tablet 0  . carvedilol (COREG) 6.25 MG tablet TAKE 1 TABLET BY MOUTH TWICE A DAY 180 tablet 3  . cholecalciferol (VITAMIN D) 1000 units tablet Take 2,000 Units by mouth every evening.    [name] dextromethorphan-guaiFENesin (MUCINEX DM) 30-600 MG 12hr tablet Take 1 tablet by mouth 2 (two)  times daily as needed for cough.    . ezetimibe (ZETIA) 10 MG tablet Take 10 mg by mouth daily.    . ferrous sulfate 325 (65 FE) MG tablet Take 325 mg by mouth every evening.     . fluticasone (FLONASE) 50 MCG/ACT nasal spray SPRAY 2 SPRAYS INTO EACH NOSTRIL EVERY DAY 16 g 2  . furosemide (LASIX) 40 MG tablet Take 80 mg by mouth 2 (two) times daily.     . insulin aspart (NOVOLOG FLEXPEN) 100 UNIT/ML FlexPen Inject 4 Units into the skin 3 (three) times daily before meals.     . insulin degludec (TRESIBA FLEXTOUCH) 100 UNIT/ML SOPN FlexTouch Pen Inject 0.3 mLs (30 Units total) into the skin daily before lunch. (Patient taking differently: Inject 18 Units into the skin daily before lunch. ) 1 pen 0  . levothyroxine (SYNTHROID, LEVOTHROID) 100 MCG tablet Take 100 mcg by mouth daily before breakfast.     . Multiple Vitamins-Minerals (PRESERVISION AREDS PO) Take 1 tablet by mouth 2 (two) times daily.    . mupirocin ointment (BACTROBAN) 2 % APPLY 1 APPLICATION TO AFFECTED AREA TWICE A DAY FOR 10 DAYS  0  . PRESCRIPTION MEDICATION Inhale into the lungs at bedtime. CPAP    . senna (SENOKOT) 8.6 MG tablet Take 2 tablets by mouth at bedtime.     [name] SPIRIVA RESPIMAT 2.5 MCG/ACT AERS INHALE 2 PUFFS INTO THE LUNGS DAILY. 1 Inhaler 1  . spironolactone (ALDACTONE) 25 MG tablet TAKE 1/2 TABLET (12.5 MG TOTAL) BY MOUTH DAILY. 45 tablet 2  . traMADol (ULTRAM) 50 MG tablet Take 50 mg by mouth 3 (three) times daily as needed.  0  . TRELEGY ELLIPTA 100-62.5-25 MCG/INH AEPB INHALE 1 PUFF INTO LUNGS ONCE A DAY 1 each 3  . vitamin B-12 (CYANOCOBALAMIN) 1000 MCG tablet Take 1,000 mcg by mouth daily.    [name] warfarin (COUMADIN) 5 MG tablet TAKE AS DIRECTED BY COUMADIN CLINIC 40 tablet 3  . warfarin (COUMADIN) 5 MG tablet Take 2.5-5 mg by mouth See admin instructions. Take 1/2 tablet on Tuesday, Thursday and Saturday then take 1 tablet all the other days     No current facility-administered medications for this visit.      Allergies:   Aldactone [spironolactone]   Social History: Social History   Socioeconomic History  . Marital status: Married    Spouse name: Not on file  . Number of children: Not on file  . Years of education: Not on file  . Highest education level: Not on file  Occupational History  . Occupation: retired    Comment: Engineer, agricultural  Social Needs  . Financial resource strain: Not on file  . Food insecurity:    Worry: Not on file    Inability: Not on file  . Transportation needs:    Medical: Not on file    Non-medical:  Not on file  Tobacco Use  . Smoking status: Former Smoker    Packs/day: 3.00    Years: 65.00    Pack years: 195.00    Types: Cigarettes    Last attempt to quit: 10/25/1998    Years since quitting: 19.6  . Smokeless tobacco: Never Used  Substance and Sexual Activity  . Alcohol use: No    Alcohol/week: 0.0 standard drinks    Comment: remote history of heavy alcohol use  . Drug use: No  . Sexual activity: Not on file  Lifestyle  . Physical activity:    Days per week: Not on file    Minutes per session: Not on file  . Stress: Not on file  Relationships  . Social connections:    Talks on phone: Not on file    Gets together: Not on file    Attends religious service: Not on file    Active member of club or organization: Not on file    Attends meetings of clubs or organizations: Not on file    Relationship status: Not on file  . Intimate partner violence:    Fear of current or ex partner: Not on file    Emotionally abused: Not on file    Physically abused: Not on file    Forced sexual activity: Not on file  Other Topics Concern  . Not on file  Social History Narrative   Lives [place]   Retired    Family History: Family History  Problem Relation Age of Onset  . Asthma Father   . Stroke Father   . Hypertension Father   . Hypertension Mother   . Heart disease Brother   . Heart attack Brother   . Hypertension Sister   . Hypertension  Brother     Review of Systems: All other systems reviewed and are otherwise negative except as noted above.   Physical Exam: VS:  BP 108/60   Pulse 78   Ht 5\' 8"  (1.727 m)   Wt 205 lb 12.8 oz (93.4 kg)   SpO2 90%   BMI 31.29 kg/m  , BMI Body mass index is 31.29 kg/m.  GEN- The patient is elderly appearing, alert and oriented x 3 today.   HEENT: normocephalic, atraumatic; sclera clear, conjunctiva pink; hearing intact; oropharynx clear; neck supple  Lungs- Clear to ausculation bilaterally, normal work of breathing.  No wheezes, rales, rhonchi Heart- Regular rate and rhythm  GI- soft, non-tender, non-distended, bowel sounds present  Extremities- no clubbing, cyanosis, or edema  MS- no significant deformity or atrophy Skin- warm and dry, no rash or lesion; ICD pocket well healed Psych- euthymic mood, full affect Neuro- strength and sensation are intact  ICD interrogation- reviewed in detail today,  See PACEART report  EKG:  EKG is not ordered today.  Recent Labs: 12/30/2017: TSH 5.190 03/13/2018: NT-Pro BNP 5,305 05/04/2018: ALT 15; BUN 26; Creatinine, Ser 1.76; Hemoglobin 13.9; Platelets 184; Potassium 4.2; Sodium 139   Wt Readings from Last 3 Encounters:  06/22/18 205 lb 12.8 oz (93.4 kg)  06/20/18 196 lb (88.9 kg)  05/08/18 209 lb (94.8 kg)      Assessment and Plan:  1.  Chronic systolic dysfunction euvolemic today Stable on an appropriate medical regimen Known RV lead fracture - has stable impedance, sensing, threshold RV tip to RV coil.  Bipolar impedance >3000 ohms.  Tachy therapies disabled.  He is not device dependant. Estimated longevity 17 months See Claudia Desanctis Art report No changes today  2.  Persistent atrial fibrillation Burden by device interrogation 5.6% Continue amiodarone 200mg  daily Labs reviewed Continue Warfarin for CHADS2VASC of 8  3.  OSA CPAP compliance encouraged - new mask is fitting better  4.  CAD No recent ischemic symptoms  5.   HTN Stable No change required today  6.  Obesity Body mass index is 31.29 kg/m. Weight loss encouraged    Current medicines are reviewed at length with the patient today.   The patient does not have concerns regarding his medicines.  The following changes were made today:  none  Labs/ tests ordered today include: none No orders of the defined types were placed in this encounter.    Disposition:   Follow up with [facility], Dr Rayann Heman 1 year, Dr Irish Lack as scheduled    Signed, Chanetta Marshall, NP 06/22/2018 8:59 AM  [facility] [address] [place] [address] [phone] (office) [phone] (fax)

## 2018-06-20 NOTE — Progress Notes (Signed)
.  Numeric Pain Rating Scale and Functional Assessment Average Pain 8 Pain Right Now 4 My pain is constant and sharp Pain is worse with: walking, bending, standing and some activites Pain improves with: rest   In the last MONTH (on 0-10 scale) has pain interfered with the following?  1. General activity like being  able to carry out your everyday physical activities such as walking, climbing stairs, carrying groceries, or moving a chair?  Rating(9)  2. Relation with others like being able to carry out your usual social activities and roles such as  activities at home, at work and in your community. Rating(6)  3. Enjoyment of life such that you have  been bothered by emotional problems such as feeling anxious, depressed or irritable?  Rating(2)

## 2018-06-21 ENCOUNTER — Other Ambulatory Visit (INDEPENDENT_AMBULATORY_CARE_PROVIDER_SITE_OTHER): Payer: Self-pay | Admitting: Radiology

## 2018-06-21 NOTE — Progress Notes (Signed)
error 

## 2018-06-21 NOTE — Progress Notes (Signed)
[name] - 84 y.o. male MRN [id number]  Date of birth: 03-Jun-1934  Office Visit Note: Visit Date: 06/20/2018 PCP: [name], MD Referred by: [name], MD  Subjective: Chief Complaint  Patient presents with  . Lower Back - Pain   HPI: [name] is an 84 year old gentleman who comes in today at the request of his primary care physician [name], MD for evaluation management of his ongoing chronic back pain which is worsened over the last several months.  Actually spoke to Dr. Laurann Montana on the phone recently about the gentlemen's case.  [name] is someone that I remember well the last time I saw him was 2 years ago.  We were seeing him for lower lumbar facet arthropathy at L4-5 and L5-S1 with stable listhesis.  Throughout the time that we saw him he never really had radicular leg complaints it was mechanical right-sided predominant axial low back pain.  We completed diagnostic facet joint blocks as well as physical therapy at the time.  The facet joint blocks were helpful greatly around 60% relief of his back pain.  These notes can be reviewed in our other electronic medical record that we had at that time.  He has had images of the lumbar spine completed at that time as well.  Unfortunately we lost him to follow-up at that point do to some cardiac issues that he was having.  He continues to have some cardiac issues and is lost weight since I have seen him last.  He is presents today with his wife who helps provide some of the history.  He reports severe low back pain with average pain 8 out of 10.  It is mostly on the right side when he tries to go from sit to stand and with standing.  He gets relief with rest.  Going from flexion to extension is very painful.  He reports constant sharp pain most of the time and he does seem to appear uncomfortable sitting.  He reports a fall in his home in May of this year.  Subsequent imaging which was a CT of the abdomen and pelvis because  of some rib pain issues did show L1 compression fracture and wedging.  Prior lumbar x-ray from 2017 also showed this but it was much less prominent with less of a compression.  CT is hard to determine the age of the fracture although it obviously appears worse on this picture than 2017.  Prior lumbar imaging in 2016 did not show the wedging at all.  He denies any groin pain or focal weakness although he feels weak in general.  He has had no paresthesias.  He has been taking some tramadol that he feels like only helps because it makes him sleepy and he does get rest.  He has been using some Tylenol as needed.  He really cannot take anti-inflammatories.  He has had no change in bowel or bladder function.  CT scan did not show any lumbar stenosis.  He has had no fevers chills or night sweats.   Review of Systems  Constitutional: Positive for weight loss. Negative for chills, fever and malaise/fatigue.  HENT: Negative for hearing loss and sinus pain.   Eyes: Negative for blurred vision, double vision and photophobia.  Respiratory: Negative for cough and shortness of breath.   Cardiovascular: Negative for chest pain, palpitations and leg swelling.  Gastrointestinal: Negative for abdominal pain, nausea and vomiting.  Genitourinary: Negative for flank pain.  Musculoskeletal: Positive for  back pain, falls and joint pain. Negative for myalgias.  Skin: Negative for itching and rash.  Neurological: Positive for weakness. Negative for tingling, tremors and focal weakness.  Endo/Heme/Allergies: Negative.   Psychiatric/Behavioral: Negative for depression.  All other systems reviewed and are negative.  Otherwise per HPI.  Assessment & Plan: Visit Diagnoses:  1. Chronic right-sided low back pain without sciatica   2. Closed compression fracture of body of L1 vertebra (HCC)   3. Long term (current) use of anticoagulants [Z79.01]   4. Morbid obesity due to excess calories (HCC)     Plan: Findings:    Exacerbation of chronic long-term right more than left axial back pain that occurred in May of this year after a fall.  Subsequent imaging showing more of a anterior wedging at L1 which could be more of an acute onset compression fracture.  Images from 1 year ago showing some compression but not nearly what shows on the CT scan.  This could be a new compression fracture and he does seem to be in a great deal of pain since that time.  However still hard to tell the acuteness of the fracture from the CT scan.  CT scan does not show any retropulsion or slippage or canal stenosis.  Patient's pain seems to be twofold and likely related to compression fracture but also the facet arthropathy on the right which is been his traditional area of pain.  Exam is consistent with both.  I think the best approach here is to order him a lumbar support and we will have him fitted by [employer].  He can wear this when he is being active and I still want him up and moving when he can I think this will help him overall.  He should not wear it at home and at rest and he understands this.  He is going to continue with his tramadol but I want to take the Tylenol schedule III times per day.  We are going to also get him as quickly as I can for facet joint blocks at the level of the compression fracture as well as lower in the L4-5 and L5-S1 region.  We can do these injections on chronic anticoagulation.  Patient cannot have MRI do to pacemaker/BiV ICD.  Likely not a great candidate for kyphoplasty although it something we should consider and I discussed this with them.  I do not do that procedure but obviously there are physicians in town including Dr. Estanislado Pandy and Dr. Lynann Bologna who we use for that.  Given his significant medical comorbidities I think if we can get him some relief and give this somewhat more time even though it did occur in May but I think he will do well.    Meds & Orders: No orders of the defined types were placed in  this encounter.  No orders of the defined types were placed in this encounter.   Follow-up: Return for Right L1-2 and L4-5 and L5-S1 facet joint block.   Procedures: No procedures performed  No notes on file   Clinical History: CT ABDOMEN AND PELVIS WITH CONTRAST  TECHNIQUE: Multidetector CT imaging of the abdomen and pelvis was performed using the standard protocol following bolus administration of intravenous contrast.  CONTRAST: 175mL OMNIPAQUE IOHEXOL 300 MG/ML SOLN  COMPARISON: 07/15/2016  FINDINGS: Lower chest: Chronic cardiomegaly. Biventricular pacer leads and changes of CABG. Chronic interstitial opacity at the bases attributed to scarring. There is also hazy ground-glass density that is likely atelectasis.  Hepatobiliary: No focal liver abnormality.Calcified gallstone. No signs of obstruction or inflammation.  Pancreas: Unremarkable.  Spleen: Unremarkable.  Adrenals/Urinary Tract: Negative adrenals. No hydronephrosis or stone. Symmetric renal atrophy. Unremarkable bladder.  Stomach/Bowel: No obstruction. No inflammatory changes. Mild distal colonic diverticulosis.  Vascular/Lymphatic: Diffuse atherosclerotic calcification. Plaque causes moderate narrowing of the proximal SMA. Small celiac trunk in the setting of replaced right hepatic artery, with likely mild atheromatous narrowing. No acute vascular finding.  Reproductive:Enlarged prostate.  Other: No ascites or pneumoperitoneum.  Musculoskeletal: Remote L1 compression fracture with progressive height loss since prior. Remote and healing left eighth rib fracture. Multiple remote right-sided rib fractures. Advanced lumbar disc and facet degeneration. No acute finding  IMPRESSION: 1. No acute finding to explain symptoms. 2. Aortic Atherosclerosis (ICD10-I70.0). 3. Cholelithiasis. 4. Mild colonic diverticulosis.   Electronically Signed By: Monte Fantasia M.D. On: 05/05/2018  00:48  CLINICAL DATA:  Chronic lumbago  EXAM: LUMBAR SPINE - 2-3 VIEW  COMPARISON:  Lumbar spine CT August 09, 2012  FINDINGS: Frontal, lateral, and spot lumbosacral lateral images were obtained. There are 5 non-rib-bearing lumbar type vertebral bodies. There is anterior wedging of the L1 vertebral body. No other fracture. There is 7 mm of retrolisthesis of L2 on L3. There is 6 mm of anterolisthesis of L5 on S1. No other spondylolisthesis. There is marked disc space narrowing at L2-3. There is mild disc space narrowing at L3-4 and L5-S1. There is atherosclerotic calcification in the aorta. There is a laminated gallstone in the right upper quadrant.  IMPRESSION: Anterior wedging of the L1 vertebral body, age uncertain but not present 1 year prior. Spondylolisthesis at L2-3 at L5-S1 is essentially stable compared to prior CT examination. Areas of arthropathy, most marked at L2-3, essentially stable.  There is aortic atherosclerosis.  There is cholelithiasis.   Electronically Signed   By: Lowella Grip III M.D.   On: 07/22/2016 11:12   He reports that he quit smoking about 19 years ago. His smoking use included cigarettes. He has a 195.00 pack-year smoking history. He has never used smokeless tobacco. No results for input(s): HGBA1C, LABURIC in the last 8760 hours.  Objective:  VS:  HT:5\' 8"  (172.7 cm)   WT:196 lb (88.9 kg)  BMI:29.81    BP:131/69  HR:70bpm  TEMP:97.7 F (36.5 C)(Oral)  RESP:98 % Physical Exam  Constitutional: He is oriented to person, place, and time. He appears well-developed and well-nourished. No distress.  HENT:  Head: Normocephalic and atraumatic.  Nose: Nose normal.  Mouth/Throat: Oropharynx is clear and moist.  Eyes: Pupils are equal, round, and reactive to light. Conjunctivae are normal.  Neck: Neck supple. No JVD present. No tracheal deviation present.  Cardiovascular: Regular rhythm and intact distal pulses.  Pulmonary/Chest:  Effort normal and breath sounds normal.  Abdominal: Soft. He exhibits no distension. There is no rebound and no guarding.  Musculoskeletal: He exhibits no deformity.  Patient somewhat uncomfortable sitting and has difficulty going from sit to stand and does have pain going from flexion to extension and rotation and facet joint loading.  He does have some pain over the rocking of the spinous process about the L1 vertebral body.  No paraspinal tenderness or trigger points are taut bands.  Mild pain over the right greater trochanter compared to left.  No pain with hip rotation.  He has good distal strength.  No clonus.  Neurological: He is alert and oriented to person, place, and time. He exhibits normal muscle tone. Coordination normal.  Skin: Skin is  warm. No rash noted.  Psychiatric: He has a normal mood and affect. His behavior is normal.  Nursing note and vitals reviewed.   Ortho Exam Imaging: No results found.  Past Medical/Family/Surgical/Social History: Medications & Allergies reviewed per EMR, new medications updated. Patient Active Problem List   Diagnosis Date Noted  . Community acquired pneumonia 09/03/2016  . Acute respiratory failure ([place]) 09/03/2016  . SOB (shortness of breath)   . A-fib ([place]) 06/11/2016  . Atrial fibrillation, persistent ([place])   . Allergic rhinitis 02/05/2016  . Long term (current) use of anticoagulants [Z79.01] 01/19/2016  . Chronic systolic heart failure ([place])   . Acute respiratory failure with hypoxia ([place]) 01/11/2016  . Acute on chronic systolic CHF (congestive heart failure), NYHA class 3 ([place]) 01/11/2016  . Demand ischemia ([place]) 01/11/2016  . Acute renal failure superimposed on stage 3 chronic kidney disease ([place]) 01/11/2016  . Diabetes mellitus with diabetic neuropathy, with long-term current use of insulin ([place]) 01/11/2016  . Morbid obesity due to excess calories ([place]) 01/11/2016  . COPD (chronic obstructive pulmonary disease) ([place]) 01/10/2016  .  Coronary artery disease   . Ischemic dilated cardiomyopathy ([place])   . PAF (paroxysmal atrial fibrillation) ([place])   . OSA (obstructive sleep apnea)   . Peripheral vascular disease ([place]) 10/02/2013  . Long term current use of anticoagulant therapy 08/21/2013  . Essential hypertension 04/01/2010  . Automatic implantable cardioverter-defibrillator in situ 03/31/2010   Past Medical History:  Diagnosis Date  . Atrial fibrillation ([place])    persistent, previously seen at [facility] and placed on amiodarone  . Benign prostatic hypertrophy   . CAD (coronary artery disease)    multivessel s/p inferolateral wall MI with subsequent CABG 11/1998.  Cath 2009 with Patent grafts  . Cleft palate   . COPD with emphysema ([place]) 04/01/2010  . DM (diabetes mellitus), type 2 ([place])   . Dyspnea   . GERD (gastroesophageal reflux disease)   . HTN (hypertension)   . Hyperlipidemia   . Hypothyroidism   . Iron deficiency anemia   . Ischemic dilated cardiomyopathy ([place])    EF 35-40% by MUGA 6/11  . Myocardial infarction ([place])   . Nasal septal deviation   . Nephrolithiasis   . OSA (obstructive sleep apnea)   . PAF (paroxysmal atrial fibrillation) ([place])   . Peripheral arterial disease (HCC)    left subclavian artery stenosis  . PNA (pneumonia)   . Psoriasis   . Seborrheic keratosis   . Stroke ([employer])   . Systolic congestive heart failure ([place]) 2009   s/p BiV ICD implantation by Dr Leonia Reeves (MD[facility])   Family History  Problem Relation Age of Onset  . Asthma Father   . Stroke Father   . Hypertension Father   . Hypertension Mother   . Heart disease Brother   . Heart attack Brother   . Hypertension Sister   . Hypertension Brother    Past Surgical History:  Procedure Laterality Date  . BI-VENTRICULAR IMPLANTABLE CARDIOVERTER DEFIBRILLATOR  (CRT-D)  10-08-08; 11-06-2013   Dr Leonia Reeves ([facility]) implant for primary prevention; gen change to [facility] VivaXT CRTD by Dr Rayann Heman  . BIV ICD GENERTAOR CHANGE OUT N/A 11/06/2013    Procedure: BIV ICD GENERTAOR CHANGE OUT;  Surgeon: Coralyn Mark, MD;  Location: [facility] CATH LAB;  Service: Cardiovascular;  Laterality: N/A;  . c-spine surgery    . CARDIOVERSION N/A 04/29/2016   Procedure: CARDIOVERSION;  Surgeon: Pixie Casino, MD;  Location: [facility];  Service: Cardiovascular;  Laterality: N/A;  . CARPAL TUNNEL RELEASE    . CATARACT EXTRACTION    . CORONARY ARTERY BYPASS GRAFT     LIMA to LAD, SVG to OM, SVG to diagonal  . ELECTROPHYSIOLOGIC STUDY N/A 06/11/2016   Procedure: Atrial Fibrillation Ablation;  Surgeon: Thompson Grayer, MD;  Location: [facility] CV LAB;  Service: Cardiovascular;  Laterality: N/A;  . left cleft palate and left cleft lip repair     Social History   Occupational History  . Occupation: retired    Comment: install floord  Tobacco Use  . Smoking status: Former Smoker    Packs/day: 3.00    Years: 65.00    Pack years: 195.00    Types: Cigarettes    Last attempt to quit: 10/25/1998    Years since quitting: 19.6  . Smokeless tobacco: Never Used  Substance and Sexual Activity  . Alcohol use: No    Alcohol/week: 0.0 standard drinks    Comment: remote history of heavy alcohol use  . Drug use: No  . Sexual activity: Not on file

## 2018-06-22 ENCOUNTER — Encounter: Payer: Self-pay | Admitting: Nurse Practitioner

## 2018-06-22 ENCOUNTER — Ambulatory Visit (INDEPENDENT_AMBULATORY_CARE_PROVIDER_SITE_OTHER): Payer: PPO | Admitting: Nurse Practitioner

## 2018-06-22 ENCOUNTER — Ambulatory Visit (INDEPENDENT_AMBULATORY_CARE_PROVIDER_SITE_OTHER): Payer: PPO | Admitting: *Deleted

## 2018-06-22 ENCOUNTER — Other Ambulatory Visit: Payer: Self-pay

## 2018-06-22 ENCOUNTER — Emergency Department (HOSPITAL_COMMUNITY): Payer: PPO

## 2018-06-22 ENCOUNTER — Emergency Department (HOSPITAL_COMMUNITY)
Admission: EM | Admit: 2018-06-22 | Discharge: 2018-06-22 | Disposition: A | Discharge status: Home / Self Care | Payer: PPO | Attending: Emergency Medicine | Admitting: Emergency Medicine

## 2018-06-22 VITALS — BP 108/60 | HR 78 | Ht 68.0 in | Wt 205.8 lb

## 2018-06-22 DIAGNOSIS — I5022 Chronic systolic (congestive) heart failure: Secondary | ICD-10-CM | POA: Diagnosis not present

## 2018-06-22 DIAGNOSIS — E1122 Type 2 diabetes mellitus with diabetic chronic kidney disease: Secondary | ICD-10-CM | POA: Diagnosis not present

## 2018-06-22 DIAGNOSIS — Y92002 Bathroom of unspecified non-institutional (private) residence single-family (private) house as the place of occurrence of the external cause: Secondary | ICD-10-CM | POA: Diagnosis not present

## 2018-06-22 DIAGNOSIS — W0110XA Fall on same level from slipping, tripping and stumbling with subsequent striking against unspecified object, initial encounter: Secondary | ICD-10-CM | POA: Diagnosis not present

## 2018-06-22 DIAGNOSIS — S0101XA Laceration without foreign body of scalp, initial encounter: Secondary | ICD-10-CM | POA: Diagnosis not present

## 2018-06-22 DIAGNOSIS — Y998 Other external cause status: Secondary | ICD-10-CM | POA: Insufficient documentation

## 2018-06-22 DIAGNOSIS — I10 Essential (primary) hypertension: Secondary | ICD-10-CM | POA: Diagnosis not present

## 2018-06-22 DIAGNOSIS — Y9389 Activity, other specified: Secondary | ICD-10-CM | POA: Diagnosis not present

## 2018-06-22 DIAGNOSIS — S01111A Laceration without foreign body of right eyelid and periocular area, initial encounter: Secondary | ICD-10-CM | POA: Insufficient documentation

## 2018-06-22 DIAGNOSIS — Z951 Presence of aortocoronary bypass graft: Secondary | ICD-10-CM | POA: Diagnosis not present

## 2018-06-22 DIAGNOSIS — W19XXXA Unspecified fall, initial encounter: Secondary | ICD-10-CM | POA: Diagnosis not present

## 2018-06-22 DIAGNOSIS — J449 Chronic obstructive pulmonary disease, unspecified: Secondary | ICD-10-CM | POA: Insufficient documentation

## 2018-06-22 DIAGNOSIS — Z7901 Long term (current) use of anticoagulants: Secondary | ICD-10-CM

## 2018-06-22 DIAGNOSIS — I481 Persistent atrial fibrillation: Secondary | ICD-10-CM

## 2018-06-22 DIAGNOSIS — N183 Chronic kidney disease, stage 3 (moderate): Secondary | ICD-10-CM | POA: Insufficient documentation

## 2018-06-22 DIAGNOSIS — Z5181 Encounter for therapeutic drug level monitoring: Secondary | ICD-10-CM | POA: Diagnosis not present

## 2018-06-22 DIAGNOSIS — Z794 Long term (current) use of insulin: Secondary | ICD-10-CM | POA: Diagnosis not present

## 2018-06-22 DIAGNOSIS — R58 Hemorrhage, not elsewhere classified: Secondary | ICD-10-CM | POA: Diagnosis not present

## 2018-06-22 DIAGNOSIS — I4819 Other persistent atrial fibrillation: Secondary | ICD-10-CM

## 2018-06-22 DIAGNOSIS — Z87891 Personal history of nicotine dependence: Secondary | ICD-10-CM | POA: Diagnosis not present

## 2018-06-22 DIAGNOSIS — Z8673 Personal history of transient ischemic attack (TIA), and cerebral infarction without residual deficits: Secondary | ICD-10-CM | POA: Diagnosis not present

## 2018-06-22 DIAGNOSIS — I130 Hypertensive heart and chronic kidney disease with heart failure and stage 1 through stage 4 chronic kidney disease, or unspecified chronic kidney disease: Secondary | ICD-10-CM | POA: Diagnosis not present

## 2018-06-22 DIAGNOSIS — E039 Hypothyroidism, unspecified: Secondary | ICD-10-CM | POA: Insufficient documentation

## 2018-06-22 DIAGNOSIS — G4733 Obstructive sleep apnea (adult) (pediatric): Secondary | ICD-10-CM | POA: Diagnosis not present

## 2018-06-22 DIAGNOSIS — I252 Old myocardial infarction: Secondary | ICD-10-CM | POA: Insufficient documentation

## 2018-06-22 DIAGNOSIS — I2510 Atherosclerotic heart disease of native coronary artery without angina pectoris: Secondary | ICD-10-CM | POA: Insufficient documentation

## 2018-06-22 DIAGNOSIS — Z79899 Other long term (current) drug therapy: Secondary | ICD-10-CM | POA: Diagnosis not present

## 2018-06-22 DIAGNOSIS — S0990XA Unspecified injury of head, initial encounter: Secondary | ICD-10-CM | POA: Diagnosis present

## 2018-06-22 DIAGNOSIS — M5489 Other dorsalgia: Secondary | ICD-10-CM | POA: Diagnosis not present

## 2018-06-22 DIAGNOSIS — I480 Paroxysmal atrial fibrillation: Secondary | ICD-10-CM | POA: Diagnosis not present

## 2018-06-22 DIAGNOSIS — R51 Headache: Secondary | ICD-10-CM | POA: Diagnosis not present

## 2018-06-22 DIAGNOSIS — S01411A Laceration without foreign body of right cheek and temporomandibular area, initial encounter: Secondary | ICD-10-CM | POA: Insufficient documentation

## 2018-06-22 DIAGNOSIS — Z9989 Dependence on other enabling machines and devices: Secondary | ICD-10-CM | POA: Diagnosis not present

## 2018-06-22 DIAGNOSIS — S0181XA Laceration without foreign body of other part of head, initial encounter: Secondary | ICD-10-CM

## 2018-06-22 LAB — CUP PACEART INCLINIC DEVICE CHECK
Date Time Interrogation Session: 20190829094649
Implantable Lead Implant Date: 20091215
Implantable Lead Implant Date: 20091215
Implantable Lead Implant Date: 20091215
Implantable Lead Location: 753858
Implantable Lead Location: 753859
Implantable Lead Location: 753860
Implantable Lead Model: 4196
Implantable Lead Model: 5076
Implantable Lead Model: 6947
Implantable Pulse Generator Implant Date: 20150113

## 2018-06-22 LAB — POCT INR: INR: 2.9 (ref 2.0–3.0)

## 2018-06-22 LAB — CBG MONITORING, ED: Glucose-Capillary: 87 mg/dL (ref 70–99)

## 2018-06-22 MED ORDER — LIDOCAINE-EPINEPHRINE (PF) 2 %-1:200000 IJ SOLN
20.0000 mL | Freq: Once | INTRAMUSCULAR | Status: AC
  Administered 2018-06-22: 20 mL via INTRADERMAL
  Filled 2018-06-22: qty 20

## 2018-06-22 NOTE — ED Notes (Signed)
Suture cart at bedside 

## 2018-06-22 NOTE — ED Triage Notes (Signed)
Patient arrived [facility]. He reports that he had to use the bathroom and was in a hurry, he states "I tripped and fell". He reports that he hit his head and left knee. Lac observed to his right-side face. Family at bedside

## 2018-06-22 NOTE — Discharge Instructions (Signed)
The stitches are fine to get wet but not fully immersed underwater, no scuba diving or swimming.  They will dissolve in about 5 days.  If there is still there after that then you can gently pluck them.  He can apply Neosporin, bacitracin or Vaseline to the area.  Can apply dressing over if he wishes.  Once the wound is closed he can apply half peroxide and half water to the area with a Q-tip if desired.  If the area gets red he has drainage for gets a fever please return for evaluation.

## 2018-06-22 NOTE — ED Provider Notes (Signed)
[place] EMERGENCY DEPARTMENT Provider Note   CSN: [id number] Arrival date & time: 06/22/18  1032     History   Chief Complaint Chief Complaint  Patient presents with  . Fall    HPI [name] is a 84 y.o. male.  84 yo M with a chief complaint of fall.  Patient was in a hurry to go to the bathroom and he lost his balance and fell onto his right side.  Complaining of a mild headache but denies any other significant symptoms denies neck pain denies chest pain abdominal pain.  He was having some mild pain to the left knee.  The history is provided by the patient.  Fall  This is a new problem. The problem occurs constantly. The problem has not changed since onset.Associated symptoms include headaches. Pertinent negatives include no chest pain, no abdominal pain and no shortness of breath. Nothing aggravates the symptoms. Nothing relieves the symptoms. He has tried nothing for the symptoms. The treatment provided no relief.    Past Medical History:  Diagnosis Date  . Atrial fibrillation ([place])    persistent, previously seen at [facility] and placed on amiodarone  . Benign prostatic hypertrophy   . CAD (coronary artery disease)    multivessel s/p inferolateral wall MI with subsequent CABG 11/1998.  Cath 2009 with Patent grafts  . Cleft palate   . COPD with emphysema ([place]) 04/01/2010  . DM (diabetes mellitus), type 2 ([place])   . Dyspnea   . GERD (gastroesophageal reflux disease)   . HTN (hypertension)   . Hyperlipidemia   . Hypothyroidism   . Iron deficiency anemia   . Ischemic dilated cardiomyopathy ([place])    EF 35-40% by MUGA 6/11  . Myocardial infarction ([place])   . Nasal septal deviation   . Nephrolithiasis   . OSA (obstructive sleep apnea)   . PAF (paroxysmal atrial fibrillation) ([place])   . Peripheral arterial disease (HCC)    left subclavian artery stenosis  . PNA (pneumonia)   . Psoriasis   . Seborrheic keratosis   . Stroke ([place])   . Systolic  congestive heart failure ([place]) 2009   s/p BiV ICD implantation by Dr Leonia Reeves (MDT)    Patient Active Problem List   Diagnosis Date Noted  . Community acquired pneumonia 09/03/2016  . Acute respiratory failure ([place]) 09/03/2016  . SOB (shortness of breath)   . A-fib ([place]) 06/11/2016  . Atrial fibrillation, persistent ([place])   . Allergic rhinitis 02/05/2016  . Long term (current) use of anticoagulants [Z79.01] 01/19/2016  . Chronic systolic heart failure ([place])   . Acute respiratory failure with hypoxia ([place]) 01/11/2016  . Acute on chronic systolic CHF (congestive heart failure), NYHA class 3 ([place]) 01/11/2016  . Demand ischemia ([place]) 01/11/2016  . Acute renal failure superimposed on stage 3 chronic kidney disease ([place]) 01/11/2016  . Diabetes mellitus with diabetic neuropathy, with long-term current use of insulin ([place]) 01/11/2016  . Morbid obesity due to excess calories ([place]) 01/11/2016  . COPD (chronic obstructive pulmonary disease) ([place]) 01/10/2016  . Coronary artery disease   . Ischemic dilated cardiomyopathy ([place])   . PAF (paroxysmal atrial fibrillation) ([place])   . OSA (obstructive sleep apnea)   . Peripheral vascular disease ([place]) 10/02/2013  . Long term current use of anticoagulant therapy 08/21/2013  . Essential hypertension 04/01/2010  . Automatic implantable cardioverter-defibrillator in situ 03/31/2010    Past Surgical History:  Procedure Laterality Date  . BI-VENTRICULAR IMPLANTABLE CARDIOVERTER DEFIBRILLATOR  (CRT-D)  10-08-08; 11-06-2013   Dr Leonia Reeves (MDT) implant for primary prevention; gen change to MDT VivaXT CRTD by Dr Rayann Heman  . BIV ICD GENERTAOR CHANGE OUT N/A 11/06/2013   Procedure: BIV ICD GENERTAOR CHANGE OUT;  Surgeon: Coralyn Mark, MD;  Location: [facility] CATH LAB;  Service: Cardiovascular;  Laterality: N/A;  . c-spine surgery    . CARDIOVERSION N/A 04/29/2016   Procedure: CARDIOVERSION;  Surgeon: Pixie Casino, MD;  Location: [facility] ENDOSCOPY;  Service: Cardiovascular;   Laterality: N/A;  . CARPAL TUNNEL RELEASE    . CATARACT EXTRACTION    . CORONARY ARTERY BYPASS GRAFT     LIMA to LAD, SVG to OM, SVG to diagonal  . ELECTROPHYSIOLOGIC STUDY N/A 06/11/2016   Procedure: Atrial Fibrillation Ablation;  Surgeon: Thompson Grayer, MD;  Location: [facility] CV LAB;  Service: Cardiovascular;  Laterality: N/A;  . left cleft palate and left cleft lip repair          Home Medications    Prior to Admission medications   Medication Sig Start Date End Date Taking? Authorizing Provider  albuterol (PROVENTIL HFA;VENTOLIN HFA) 108 (90 BASE) MCG/ACT inhaler Inhale 2 puffs into the lungs every 6 (six) hours as needed for wheezing or shortness of breath. Must keep June 2016 appt. 03/04/15   Chesley Mires, MD  albuterol (PROVENTIL) (2.5 MG/3ML) 0.083% nebulizer solution USE 1 VIAL VIA NEBULIZER 3 TIMES A DAY AS DIRECTED. DX:J44.9 12/15/17   Chesley Mires, MD  amiodarone (PACERONE) 400 MG tablet TAKE HALF TABLET (200 MG TOTAL) BY MOUTH DAILY. 02/21/18   Allred, Jeneen Rinks, MD  atorvastatin (LIPITOR) 80 MG tablet TAKE 1 TABLET (80 MG TOTAL) BY MOUTH DAILY. 10/17/17   Jettie Booze, MD  bisacodyl (DULCOLAX) 5 MG EC tablet Take 2 tablets (10 mg total) by mouth daily at 12 noon. 09/06/16   Regalado, Belkys A, MD  carvedilol (COREG) 6.25 MG tablet TAKE 1 TABLET BY MOUTH TWICE A DAY 05/02/18   Jettie Booze, MD  cholecalciferol (VITAMIN D) 1000 units tablet Take 2,000 Units by mouth every evening.    [provider]  dextromethorphan-guaiFENesin (MUCINEX DM) 30-600 MG 12hr tablet Take 1 tablet by mouth 2 (two) times daily as needed for cough.    [provider]  ezetimibe (ZETIA) 10 MG tablet Take 10 mg by mouth daily.    [provider]  ferrous sulfate 325 (65 FE) MG tablet Take 325 mg by mouth every evening.     [provider]  fluticasone (FLONASE) 50 MCG/ACT nasal spray SPRAY 2 SPRAYS INTO EACH NOSTRIL EVERY DAY 05/22/18   Chesley Mires, MD    furosemide (LASIX) 40 MG tablet Take 80 mg by mouth 2 (two) times daily.     [provider]  insulin aspart (NOVOLOG FLEXPEN) 100 UNIT/ML FlexPen Inject 4 Units into the skin 3 (three) times daily before meals.     [provider]  insulin degludec (TRESIBA FLEXTOUCH) 100 UNIT/ML SOPN FlexTouch Pen Inject 0.3 mLs (30 Units total) into the skin daily before lunch. Patient taking differently: Inject 18 Units into the skin daily before lunch.  09/06/16   Regalado, Belkys A, MD  levothyroxine (SYNTHROID, LEVOTHROID) 100 MCG tablet Take 100 mcg by mouth daily before breakfast.  09/23/15   [provider]  Multiple Vitamins-Minerals (PRESERVISION AREDS PO) Take 1 tablet by mouth 2 (two) times daily.    [provider]  mupirocin ointment (BACTROBAN) 2 % APPLY 1 APPLICATION TO AFFECTED AREA TWICE A DAY  FOR 10 DAYS 05/11/18   [provider]  PRESCRIPTION MEDICATION Inhale into the lungs at bedtime. CPAP    [provider]  senna (SENOKOT) 8.6 MG tablet Take 2 tablets by mouth at bedtime.     [provider]  SPIRIVA RESPIMAT 2.5 MCG/ACT AERS INHALE 2 PUFFS INTO THE LUNGS DAILY. 04/11/17   Chesley Mires, MD  spironolactone (ALDACTONE) 25 MG tablet TAKE 1/2 TABLET (12.5 MG TOTAL) BY MOUTH DAILY. 06/01/18   Jettie Booze, MD  traMADol (ULTRAM) 50 MG tablet Take 50 mg by mouth 3 (three) times daily as needed. 06/07/18   [provider]  Donnal Debar 100-62.5-25 MCG/INH AEPB INHALE 1 PUFF INTO LUNGS ONCE A DAY 02/27/18   Chesley Mires, MD  vitamin B-12 (CYANOCOBALAMIN) 1000 MCG tablet Take 1,000 mcg by mouth daily.    [provider]  warfarin (COUMADIN) 5 MG tablet TAKE AS DIRECTED BY COUMADIN CLINIC 03/31/18   Jettie Booze, MD  warfarin (COUMADIN) 5 MG tablet Take 2.5-5 mg by mouth See admin instructions. Take 1/2 tablet on Tuesday, Thursday and Saturday then take 1 tablet all the other days    [provider]     Family History Family History  Problem Relation Age of Onset  . Asthma Father   . Stroke Father   . Hypertension Father   . Hypertension Mother   . Heart disease Brother   . Heart attack Brother   . Hypertension Sister   . Hypertension Brother     Social History Social History   Tobacco Use  . Smoking status: Former Smoker    Packs/day: 3.00    Years: 65.00    Pack years: 195.00    Types: Cigarettes    Last attempt to quit: 10/25/1998    Years since quitting: 19.6  . Smokeless tobacco: Never Used  Substance Use Topics  . Alcohol use: No    Alcohol/week: 0.0 standard drinks    Comment: remote history of heavy alcohol use  . Drug use: No     Allergies   Aldactone [spironolactone]   Review of Systems Review of Systems  Constitutional: Negative for chills and fever.  HENT: Negative for congestion and facial swelling.   Eyes: Negative for discharge and visual disturbance.  Respiratory: Negative for shortness of breath.   Cardiovascular: Negative for chest pain and palpitations.  Gastrointestinal: Negative for abdominal pain, diarrhea and vomiting.  Musculoskeletal: Negative for arthralgias and myalgias.  Skin: Positive for wound. Negative for color change and rash.  Neurological: Positive for headaches. Negative for tremors and syncope.  Psychiatric/Behavioral: Negative for confusion and dysphoric mood.     Physical Exam Updated Vital Signs BP (!) 170/78 (BP Location: Right Arm)   Pulse 72   Temp 97.9 F (36.6 C) (Oral)   Resp 16   SpO2 97%   Physical Exam  Constitutional: He is oriented to person, place, and time. He appears well-developed and well-nourished.  HENT:  Head: Normocephalic.  Hematoma and laceration above the right eyebrow.  4cm in length.  2.7 cm lac below the right lower eyelid. EOM intact  Eyes: Pupils are equal, round, and reactive to light. EOM are normal.  Neck: Normal range of motion. Neck supple. No JVD present.  Cardiovascular:  Normal rate and regular rhythm. Exam reveals no gallop and no friction rub.  No murmur heard. Pulmonary/Chest: No respiratory distress. He has no wheezes.  Abdominal: He exhibits no distension and no mass. There is no tenderness. There  is no rebound and no guarding.  Musculoskeletal: Normal range of motion.  Neurological: He is alert and oriented to person, place, and time.  Skin: No rash noted. No pallor.  Psychiatric: He has a normal mood and affect. His behavior is normal.  Nursing note and vitals reviewed.    ED Treatments / Results  Labs (all labs ordered are listed, but only abnormal results are displayed) Labs Reviewed  CBG MONITORING, ED    EKG None  Radiology Ct Head Wo Contrast  Result Date: 06/22/2018 CLINICAL DATA:  Recent fall with headaches EXAM: CT HEAD WITHOUT CONTRAST TECHNIQUE: Contiguous axial images were obtained from the base of the skull through the vertex without intravenous contrast. COMPARISON:  04/01/2017 FINDINGS: Brain: Diffuse atrophic changes and chronic white matter ischemic changes are seen. No findings to suggest acute hemorrhage, acute infarction or space-occupying mass lesion are noted. Vascular: No hyperdense vessel or unexpected calcification. Skull: Normal. Negative for fracture or focal lesion. Sinuses/Orbits: No acute finding. Other: Right scalp laceration is noted in the forehead consistent with the recent injury. IMPRESSION: Chronic atrophic and ischemic changes without acute abnormality. Right scalp laceration. Electronically Signed   By: [name] M.D.   On: 06/22/2018 12:23    Procedures .Marland KitchenLaceration Repair Date/Time: 06/22/2018 1:10 PM Performed by: Deno Etienne, DO Authorized by: Deno Etienne, DO   Consent:    Consent obtained:  Verbal   Consent given by:  Patient   Risks discussed:  Infection, poor cosmetic result, pain and poor wound healing   Alternatives discussed:  No treatment, delayed treatment and observation Anesthesia (see  MAR for exact dosages):    Anesthesia method:  Local infiltration   Local anesthetic:  Lidocaine 2% WITH epi Laceration details:    Location:  Face   Face location:  R eyebrow   Length (cm):  4 Repair type:    Repair type:  Intermediate Pre-procedure details:    Preparation:  Patient was prepped and draped in usual sterile fashion Exploration:    Hemostasis achieved with:  Epinephrine and direct pressure   Wound exploration: entire depth of wound probed and visualized     Wound extent: fascia violated     Wound extent: no foreign bodies/material noted     Contaminated: no   Treatment:    Area cleansed with:  Saline   Amount of cleaning:  Standard   Irrigation solution:  Sterile saline   Irrigation volume:  20   Irrigation method:  Syringe   Visualized foreign bodies/material removed: no   Skin repair:    Repair method:  Sutures   Suture size:  5-0   Suture material:  Fast-absorbing gut   Suture technique:  Simple interrupted   Number of sutures:  4 Approximation:    Approximation:  Close Post-procedure details:    Dressing:  Open (no dressing)   Patient tolerance of procedure:  Tolerated well, no immediate complications .Marland KitchenLaceration Repair Date/Time: 06/22/2018 1:11 PM Performed by: Deno Etienne, DO Authorized by: Deno Etienne, DO   Consent:    Consent obtained:  Verbal   Consent given by:  Patient   Risks discussed:  Infection, pain and poor cosmetic result   Alternatives discussed:  No treatment and delayed treatment Anesthesia (see MAR for exact dosages):    Anesthesia method:  Local infiltration   Local anesthetic:  Lidocaine 2% WITH epi Laceration details:    Location:  Face   Face location:  R cheek   Length (cm):  2.7 Repair type:  Repair type:  Intermediate Pre-procedure details:    Preparation:  Patient was prepped and draped in usual sterile fashion Exploration:    Hemostasis achieved with:  Epinephrine and direct pressure   Wound exploration: entire  depth of wound probed and visualized     Wound extent: no fascia violation noted, no foreign bodies/material noted and no muscle damage noted     Contaminated: no   Treatment:    Area cleansed with:  Saline   Amount of cleaning:  Standard   Irrigation solution:  Sterile saline   Irrigation volume:  10   Irrigation method:  Syringe   Visualized foreign bodies/material removed: no   Skin repair:    Repair method:  Sutures   Suture size:  5-0   Suture material:  Fast-absorbing gut   Suture technique:  Simple interrupted   Number of sutures:  4 Approximation:    Approximation:  Close Post-procedure details:    Dressing:  Open (no dressing)   Patient tolerance of procedure:  Tolerated well, no immediate complications   (including critical care time)  Medications Ordered in ED Medications  lidocaine-EPINEPHrine (XYLOCAINE W/EPI) 2 %-1:200000 (PF) injection 20 mL (20 mLs Intradermal Given by Other 06/22/18 1306)     Initial Impression / Assessment and Plan / ED Course  I have reviewed the triage vital signs and the nursing notes.  Pertinent labs & imaging results that were available during my care of the patient were reviewed by me and considered in my medical decision making (see chart for details).     84 yo M with a chief complaint of a mechanical fall.  The patient has 2 lacerations which I repaired at bedside.  He was able to stand and with assistance walk to the bathroom.  I doubt that he has a fracture to the left knee.  Will have follow-up with his family doctor.  Return precautions given.  CT the head is negative for intracranial hemorrhage is viewed by me.  1:13 PM:  I have discussed the diagnosis/risks/treatment options with the patient and family and believe the pt to be eligible for discharge home to follow-up with PCP. We also discussed returning to the ED immediately if new or worsening sx occur. We discussed the sx which are most concerning (e.g., sudden worsening pain,  fever, inability to tolerate by mouth) that necessitate immediate return. Medications administered to the patient during their visit and any new prescriptions provided to the patient are listed below.  Medications given during this visit Medications  lidocaine-EPINEPHrine (XYLOCAINE W/EPI) 2 %-1:200000 (PF) injection 20 mL (20 mLs Intradermal Given by Other 06/22/18 1306)      The patient appears reasonably screen and/or stabilized for discharge and I doubt any other medical condition or other [facility] requiring further screening, evaluation, or treatment in the ED at this time prior to discharge.    Final Clinical Impressions(s) / ED Diagnoses   Final diagnoses:  Fall, initial encounter  Laceration of scalp without foreign body, initial encounter  Facial laceration, initial encounter    ED Discharge Orders    None       Deno Etienne, DO 06/22/18 1313

## 2018-06-22 NOTE — ED Notes (Signed)
Patient transported to CT 

## 2018-06-22 NOTE — Patient Instructions (Signed)
Description   Continue on same dosage 1 tablet everyday except 1/2 tablet on Wednesdays.  Continue eating 2-3 servings of leafy green vegetable each week.  Recheck in 4 weeks.   Call [phone] with any new medications.

## 2018-06-22 NOTE — Patient Instructions (Addendum)
Medication Instructions:   Your physician recommends that you continue on your current medications as directed. Please refer to the Current Medication list given to you today.   If you need a refill on your cardiac medications before your next appointment, please call your pharmacy.  Labwork: NONE ORDERED  TODAY    Testing/Procedures: NONE ORDERED  TODAY    Follow-Up:  Your physician wants you to follow-up in: [place] will receive a reminder letter in the mail two months in advance. If you don't receive a letter, please call our office to schedule the follow-up appointment.  Remote monitoring is used to monitor your Pacemaker of ICD from home. This monitoring reduces the number of office visits required to check your device to one time per year. It allows [place] to keep an eye on the functioning of your device to ensure it is working properly. You are scheduled for a device check from home on . 07-13-18 You may send your transmission at any time that day. If you have a wireless device, the transmission will be sent automatically. After your physician reviews your transmission, you will receive a postcard with your next transmission date.  Any Other Special Instructions Will Be Listed Below (If Applicable).

## 2018-06-23 ENCOUNTER — Ambulatory Visit (INDEPENDENT_AMBULATORY_CARE_PROVIDER_SITE_OTHER): Payer: Self-pay

## 2018-06-23 ENCOUNTER — Encounter (INDEPENDENT_AMBULATORY_CARE_PROVIDER_SITE_OTHER): Payer: Self-pay | Admitting: Physical Medicine and Rehabilitation

## 2018-06-23 ENCOUNTER — Ambulatory Visit (INDEPENDENT_AMBULATORY_CARE_PROVIDER_SITE_OTHER): Payer: PPO | Admitting: Physical Medicine and Rehabilitation

## 2018-06-23 VITALS — BP 125/66 | HR 72

## 2018-06-23 DIAGNOSIS — W19XXXD Unspecified fall, subsequent encounter: Secondary | ICD-10-CM

## 2018-06-23 DIAGNOSIS — Z7901 Long term (current) use of anticoagulants: Secondary | ICD-10-CM

## 2018-06-23 DIAGNOSIS — M47816 Spondylosis without myelopathy or radiculopathy, lumbar region: Secondary | ICD-10-CM | POA: Diagnosis not present

## 2018-06-23 DIAGNOSIS — Y92009 Unspecified place in unspecified non-institutional (private) residence as the place of occurrence of the external cause: Secondary | ICD-10-CM

## 2018-06-23 DIAGNOSIS — S0083XD Contusion of other part of head, subsequent encounter: Secondary | ICD-10-CM

## 2018-06-23 MED ORDER — METHYLPREDNISOLONE ACETATE 80 MG/ML IJ SUSP
80.0000 mg | Freq: Once | INTRAMUSCULAR | Status: AC
  Administered 2018-06-23: 80 mg

## 2018-06-23 NOTE — Progress Notes (Signed)
  Numeric Pain Rating Scale and Functional Assessment Average Pain 6   In the last MONTH (on 0-10 scale) has pain interfered with the following?  1. General activity like being  able to carry out your everyday physical activities such as walking, climbing stairs, carrying groceries, or moving a chair?  Rating(3)   +Driver, -Dye Allergies.

## 2018-06-23 NOTE — Procedures (Signed)
Lumbar Facet Joint Intra-Articular Injection(s) with Fluoroscopic Guidance  Patient: [name]      Date of Birth: 02/19/1934 MRN: [id number] PCP: [name], MD      Visit Date: 06/23/2018   Universal Protocol:    Date/Time: 06/23/2018  Consent Given By: the patient  Position: PRONE   Additional Comments: Vital signs were monitored before and after the procedure. Patient was prepped and draped in the usual sterile fashion. The correct patient, procedure, and site was verified.   Injection Procedure Details:  Procedure Site One Meds Administered:  Meds ordered this encounter  Medications  . methylPREDNISolone acetate (DEPO-MEDROL) injection 80 mg     Laterality: Left  Location/Site:  L1-L2 L4-L5 L5-S1  Needle size: 22 guage  Needle type: Spinal  Needle Placement: Articular  Findings:  -Comments: Excellent flow of contrast producing a partial arthrogram.  Procedure Details: The fluoroscope beam is vertically oriented in AP, and the inferior recess is visualized beneath the lower pole of the inferior apophyseal process, which represents the target point for needle insertion. When direct visualization is difficult the target point is located at the medial projection of the vertebral pedicle. The region overlying each aforementioned target is locally anesthetized with a 1 to 2 ml. volume of 1% Lidocaine without Epinephrine.   The spinal needle was inserted into each of the above mentioned facet joints using biplanar fluoroscopic guidance. A 0.25 to 0.5 ml. volume of Isovue-250 was injected and a partial facet joint arthrogram was obtained. A single spot film was obtained of the resulting arthrogram.    One to 1.25 ml of the steroid/anesthetic solution was then injected into each of the facet joints noted above.   Additional Comments:  The patient tolerated the procedure well Dressing: Band-Aid    Post-procedure details: Patient was observed during the  procedure. Post-procedure instructions were reviewed.  Patient left the clinic in stable condition.

## 2018-06-23 NOTE — Patient Instructions (Signed)

## 2018-06-29 ENCOUNTER — Ambulatory Visit (INDEPENDENT_AMBULATORY_CARE_PROVIDER_SITE_OTHER): Payer: PPO

## 2018-06-29 DIAGNOSIS — Z95810 Presence of automatic (implantable) cardiac defibrillator: Secondary | ICD-10-CM | POA: Diagnosis not present

## 2018-06-29 DIAGNOSIS — I5022 Chronic systolic (congestive) heart failure: Secondary | ICD-10-CM | POA: Diagnosis not present

## 2018-06-30 NOTE — Progress Notes (Signed)
EPIC Encounter for ICM Monitoring  Patient Name: [name] is a 84 y.o. male Date: 06/30/2018 Primary Care Physican: [name], MD Primary [place] Electrophysiologist: Allred Dry Weight:Previous weight 207lbs      Attempted call to wife.  Left detailed message, per DPR, regarding transmission.  Transmission reviewed.    Thoracic impedance normal.  Prescribed dosage: Furosemide40 mgtake1tablettwice a day (decreased on6/04/2018)due to low BP.  Labs: 05/04/2018 Creatinine 1.76, BUN 26, Potassium 4.2, Sodium 139, EGFR 34-40 02/27/2018 Creatinine 1.23, BUN 16, Potassium 4.3, Sodium 139, EGFR 54-63, BNP 10,884 12/30/2017 Creatinine 1.24 BUN 15, Potassium 4.6, Sodium 139, EGFR 54-60 11/13/2017Creatinine 1.46, BUN 37, Potassium 4.2, Sodium 136, EGFR 44-51 11/12/2017Creatinine 1.33, BUN 30, Potassium 4.2, Sodium 137, EGFR 49-57  11/11/2017Creatinine 1.27, BUN 21, Potassium 4.1, Sodium 136, EGFR 52-60  11/10/2017Creatinine 1.48, BUN 21, Potassium 3.9, Sodium 135, EGFR 43-50  07/02/2016 Creatinine 1.69, BUN 23, Potassium 4.6, Sodium 140  06/23/2016 Creatinine 1.68, BUN 26, Potassium 4.9, Sodium 138  06/02/2016 Creatinine 1.28, BUN 27, Potassium 5.3, Sodium 139  Recommendations: Left voice mail with ICM number and encouraged to call if experiencing any fluid symptoms.  Follow-up plan: ICM clinic phone appointment on 07/31/2018.    Copy of ICM check sent to Dr. Rayann Heman.   3 month ICM trend: 06/29/2018    1 Year ICM trend:       Rosalene Billings, RN 06/30/2018 9:17 AM

## 2018-07-03 DIAGNOSIS — S0181XD Laceration without foreign body of other part of head, subsequent encounter: Secondary | ICD-10-CM | POA: Diagnosis not present

## 2018-07-06 ENCOUNTER — Other Ambulatory Visit: Payer: Self-pay | Admitting: Pulmonary Disease

## 2018-07-08 ENCOUNTER — Other Ambulatory Visit: Payer: Self-pay | Admitting: Interventional Cardiology

## 2018-07-10 NOTE — Progress Notes (Signed)
[name] - 84 y.o. male MRN [id number]  Date of birth: 12-Oct-1934  Office Visit Note: Visit Date: 06/23/2018 PCP: [name], MD Referred by: [name], MD  Subjective: Chief Complaint  Patient presents with  . Lower Back - Pain   HPI: [name] is an 84 year old gentleman who comes in today for planned diagnostic and hopefully therapeutic facet joint blocks.  Please see our prior evaluation and management note for further details and justification.  [name] did have a fall yesterday where he hit his head.  He was seen in the emergency department and did receive some sutures above the eye.  CT scan was performed of the head to look for any intracranial issues and this was negative.  He will follow-up with primary care physician in this regard.  He does not feel like he hurt his back or injured anything from a orthopedic standpoint.   ROS Otherwise per HPI.  Assessment & Plan: Visit Diagnoses:  1. Spondylosis without myelopathy or radiculopathy, lumbar region   2. Fall in home, subsequent encounter   3. Long term (current) use of anticoagulants [Z79.01]   4. Contusion of face, subsequent encounter     Plan: No additional findings.   Meds & Orders:  Meds ordered this encounter  Medications  . methylPREDNISolone acetate (DEPO-MEDROL) injection 80 mg    Orders Placed This Encounter  Procedures  . Facet Injection  . XR C-ARM NO REPORT    Follow-up: Return if symptoms worsen or fail to improve.   Procedures: No procedures performed  Lumbar Facet Joint Intra-Articular Injection(s) with Fluoroscopic Guidance  Patient: [name]      Date of Birth: Oct 06, 1934 MRN: [id number] PCP: [name], MD      Visit Date: 06/23/2018   Universal Protocol:    Date/Time: 06/23/2018  Consent Given By: the patient  Position: PRONE   Additional Comments: Vital signs were monitored before and after the procedure. Patient was prepped and draped in the  usual sterile fashion. The correct patient, procedure, and site was verified.   Injection Procedure Details:  Procedure Site One Meds Administered:  Meds ordered this encounter  Medications  . methylPREDNISolone acetate (DEPO-MEDROL) injection 80 mg     Laterality: Left  Location/Site:  L1-L2 L4-L5 L5-S1  Needle size: 22 guage  Needle type: Spinal  Needle Placement: Articular  Findings:  -Comments: Excellent flow of contrast producing a partial arthrogram.  Procedure Details: The fluoroscope beam is vertically oriented in AP, and the inferior recess is visualized beneath the lower pole of the inferior apophyseal process, which represents the target point for needle insertion. When direct visualization is difficult the target point is located at the medial projection of the vertebral pedicle. The region overlying each aforementioned target is locally anesthetized with a 1 to 2 ml. volume of 1% Lidocaine without Epinephrine.   The spinal needle was inserted into each of the above mentioned facet joints using biplanar fluoroscopic guidance. A 0.25 to 0.5 ml. volume of Isovue-250 was injected and a partial facet joint arthrogram was obtained. A single spot film was obtained of the resulting arthrogram.    One to 1.25 ml of the steroid/anesthetic solution was then injected into each of the facet joints noted above.   Additional Comments:  The patient tolerated the procedure well Dressing: Band-Aid    Post-procedure details: Patient was observed during the procedure. Post-procedure instructions were reviewed.  Patient left the clinic in stable condition.    Clinical  History: CT ABDOMEN AND PELVIS WITH CONTRAST  TECHNIQUE: Multidetector CT imaging of the abdomen and pelvis was performed using the standard protocol following bolus administration of intravenous contrast.  CONTRAST: 152mL OMNIPAQUE IOHEXOL 300 MG/ML SOLN  COMPARISON: 07/15/2016  FINDINGS: Lower  chest: Chronic cardiomegaly. Biventricular pacer leads and changes of CABG. Chronic interstitial opacity at the bases attributed to scarring. There is also hazy ground-glass density that is likely atelectasis.  Hepatobiliary: No focal liver abnormality.Calcified gallstone. No signs of obstruction or inflammation.  Pancreas: Unremarkable.  Spleen: Unremarkable.  Adrenals/Urinary Tract: Negative adrenals. No hydronephrosis or stone. Symmetric renal atrophy. Unremarkable bladder.  Stomach/Bowel: No obstruction. No inflammatory changes. Mild distal colonic diverticulosis.  Vascular/Lymphatic: Diffuse atherosclerotic calcification. Plaque causes moderate narrowing of the proximal SMA. Small celiac trunk in the setting of replaced right hepatic artery, with likely mild atheromatous narrowing. No acute vascular finding.  Reproductive:Enlarged prostate.  Other: No ascites or pneumoperitoneum.  Musculoskeletal: Remote L1 compression fracture with progressive height loss since prior. Remote and healing left eighth rib fracture. Multiple remote right-sided rib fractures. Advanced lumbar disc and facet degeneration. No acute finding  IMPRESSION: 1. No acute finding to explain symptoms. 2. Aortic Atherosclerosis (ICD10-I70.0). 3. Cholelithiasis. 4. Mild colonic diverticulosis.   Electronically Signed By: Monte Fantasia M.D. On: 05/05/2018 00:48  CLINICAL DATA:  Chronic lumbago  EXAM: LUMBAR SPINE - 2-3 VIEW  COMPARISON:  Lumbar spine CT August 09, 2012  FINDINGS: Frontal, lateral, and spot lumbosacral lateral images were obtained. There are 5 non-rib-bearing lumbar type vertebral bodies. There is anterior wedging of the L1 vertebral body. No other fracture. There is 7 mm of retrolisthesis of L2 on L3. There is 6 mm of anterolisthesis of L5 on S1. No other spondylolisthesis. There is marked disc space narrowing at L2-3. There is mild disc space narrowing at  L3-4 and L5-S1. There is atherosclerotic calcification in the aorta. There is a laminated gallstone in the right upper quadrant.  IMPRESSION: Anterior wedging of the L1 vertebral body, age uncertain but not present 1 year prior. Spondylolisthesis at L2-3 at L5-S1 is essentially stable compared to prior CT examination. Areas of arthropathy, most marked at L2-3, essentially stable.  There is aortic atherosclerosis.  There is cholelithiasis.   Electronically Signed   By: Lowella Grip III M.D.   On: 07/22/2016 11:12   He reports that he quit smoking about 19 years ago. His smoking use included cigarettes. He has a 195.00 pack-year smoking history. He has never used smokeless tobacco. No results for input(s): HGBA1C, LABURIC in the last 8760 hours.  Objective:  VS:  HT:    WT:   BMI:     BP:125/66  HR:72bpm  TEMP: ( )  RESP:97 % Physical Exam  Ortho Exam Imaging: No results found.  Past Medical/Family/Surgical/Social History: Medications & Allergies reviewed per EMR, new medications updated. Patient Active Problem List   Diagnosis Date Noted  . Community acquired pneumonia 09/03/2016  . Acute respiratory failure ([place]) 09/03/2016  . SOB (shortness of breath)   . A-fib ([place]) 06/11/2016  . Atrial fibrillation, persistent ([place])   . Allergic rhinitis 02/05/2016  . Long term (current) use of anticoagulants [Z79.01] 01/19/2016  . Chronic systolic heart failure ([place])   . Acute respiratory failure with hypoxia ([place]) 01/11/2016  . Acute on chronic systolic CHF (congestive heart failure), NYHA class 3 ([place]) 01/11/2016  . Demand ischemia ([place]) 01/11/2016  . Acute renal failure superimposed on stage 3 chronic kidney disease ([place]) 01/11/2016  . Diabetes  mellitus with diabetic neuropathy, with long-term current use of insulin ([place]) 01/11/2016  . Morbid obesity due to excess calories ([place]) 01/11/2016  . COPD (chronic obstructive pulmonary disease) ([place]) 01/10/2016  . Coronary  artery disease   . Ischemic dilated cardiomyopathy ([place])   . PAF (paroxysmal atrial fibrillation) ([place])   . OSA (obstructive sleep apnea)   . Peripheral vascular disease ([place]) 10/02/2013  . Long term current use of anticoagulant therapy 08/21/2013  . Essential hypertension 04/01/2010  . Automatic implantable cardioverter-defibrillator in situ 03/31/2010   Past Medical History:  Diagnosis Date  . Atrial fibrillation ([place])    persistent, previously seen at [facility] and placed on amiodarone  . Benign prostatic hypertrophy   . CAD (coronary artery disease)    multivessel s/p inferolateral wall MI with subsequent CABG 11/1998.  Cath 2009 with Patent grafts  . Cleft palate   . COPD with emphysema ([place]) 04/01/2010  . DM (diabetes mellitus), type 2 ([place])   . Dyspnea   . GERD (gastroesophageal reflux disease)   . HTN (hypertension)   . Hyperlipidemia   . Hypothyroidism   . Iron deficiency anemia   . Ischemic dilated cardiomyopathy ([place])    EF 35-40% by MUGA 6/11  . Myocardial infarction ([place])   . Nasal septal deviation   . Nephrolithiasis   . OSA (obstructive sleep apnea)   . PAF (paroxysmal atrial fibrillation) ([place])   . Peripheral arterial disease ([facility])    left subclavian artery stenosis  . PNA (pneumonia)   . Psoriasis   . Seborrheic keratosis   . Stroke ([place])   . Systolic congestive heart failure ([place]) 2009   s/p BiV ICD implantation by Dr Leonia Reeves (MDT)   Family History  Problem Relation Age of Onset  . Asthma Father   . Stroke Father   . Hypertension Father   . Hypertension Mother   . Heart disease Brother   . Heart attack Brother   . Hypertension Sister   . Hypertension Brother    Past Surgical History:  Procedure Laterality Date  . BI-VENTRICULAR IMPLANTABLE CARDIOVERTER DEFIBRILLATOR  (CRT-D)  10-08-08; 11-06-2013   Dr Leonia Reeves (MD[facility]) implant for primary prevention; gen change to MD[facility] VivaXT CRTD by Dr Rayann Heman  . BIV ICD GENERTAOR CHANGE OUT N/A 11/06/2013   Procedure: BIV  ICD GENERTAOR CHANGE OUT;  Surgeon: Coralyn Mark, MD;  Location: [facility] CATH LAB;  Service: Cardiovascular;  Laterality: N/A;  . c-spine surgery    . CARDIOVERSION N/A 04/29/2016   Procedure: CARDIOVERSION;  Surgeon: Pixie Casino, MD;  Location: [facility] ENDOSCOPY;  Service: Cardiovascular;  Laterality: N/A;  . CARPAL TUNNEL RELEASE    . CATARACT EXTRACTION    . CORONARY ARTERY BYPASS GRAFT     LIMA to LAD, SVG to OM, SVG to diagonal  . ELECTROPHYSIOLOGIC STUDY N/A 06/11/2016   Procedure: Atrial Fibrillation Ablation;  Surgeon: Thompson Grayer, MD;  Location: [facility] CV LAB;  Service: Cardiovascular;  Laterality: N/A;  . left cleft palate and left cleft lip repair     Social History   Occupational History  . Occupation: retired    Comment: install floord  Tobacco Use  . Smoking status: Former Smoker    Packs/day: 3.00    Years: 65.00    Pack years: 195.00    Types: Cigarettes    Last attempt to quit: 10/25/1998    Years since quitting: 19.7  . Smokeless tobacco: Never Used  Substance and Sexual Activity  . Alcohol use: No  Alcohol/week: 0.0 standard drinks    Comment: remote history of heavy alcohol use  . Drug use: No  . Sexual activity: Not on file

## 2018-07-13 ENCOUNTER — Other Ambulatory Visit: Payer: Self-pay

## 2018-07-13 ENCOUNTER — Inpatient Hospital Stay (HOSPITAL_COMMUNITY)
Admission: EM | Admit: 2018-07-13 | Discharge: 2018-07-15 | DRG: 871 | Disposition: A | Discharge status: Home Health | Payer: PPO | Attending: Internal Medicine | Admitting: Internal Medicine

## 2018-07-13 ENCOUNTER — Ambulatory Visit (INDEPENDENT_AMBULATORY_CARE_PROVIDER_SITE_OTHER): Payer: PPO | Admitting: *Deleted

## 2018-07-13 ENCOUNTER — Emergency Department (HOSPITAL_COMMUNITY): Payer: PPO

## 2018-07-13 ENCOUNTER — Encounter (HOSPITAL_COMMUNITY): Payer: Self-pay | Admitting: Emergency Medicine

## 2018-07-13 ENCOUNTER — Telehealth: Payer: Self-pay | Admitting: Nurse Practitioner

## 2018-07-13 DIAGNOSIS — E785 Hyperlipidemia, unspecified: Secondary | ICD-10-CM | POA: Diagnosis present

## 2018-07-13 DIAGNOSIS — Z95810 Presence of automatic (implantable) cardiac defibrillator: Secondary | ICD-10-CM | POA: Diagnosis not present

## 2018-07-13 DIAGNOSIS — I420 Dilated cardiomyopathy: Secondary | ICD-10-CM | POA: Diagnosis present

## 2018-07-13 DIAGNOSIS — J449 Chronic obstructive pulmonary disease, unspecified: Secondary | ICD-10-CM

## 2018-07-13 DIAGNOSIS — I10 Essential (primary) hypertension: Secondary | ICD-10-CM

## 2018-07-13 DIAGNOSIS — I480 Paroxysmal atrial fibrillation: Secondary | ICD-10-CM | POA: Diagnosis present

## 2018-07-13 DIAGNOSIS — R0602 Shortness of breath: Secondary | ICD-10-CM

## 2018-07-13 DIAGNOSIS — R791 Abnormal coagulation profile: Secondary | ICD-10-CM | POA: Diagnosis present

## 2018-07-13 DIAGNOSIS — R0689 Other abnormalities of breathing: Secondary | ICD-10-CM | POA: Diagnosis not present

## 2018-07-13 DIAGNOSIS — L8990 Pressure ulcer of unspecified site, unspecified stage: Secondary | ICD-10-CM | POA: Diagnosis present

## 2018-07-13 DIAGNOSIS — Z7951 Long term (current) use of inhaled steroids: Secondary | ICD-10-CM

## 2018-07-13 DIAGNOSIS — E1151 Type 2 diabetes mellitus with diabetic peripheral angiopathy without gangrene: Secondary | ICD-10-CM | POA: Diagnosis present

## 2018-07-13 DIAGNOSIS — E1140 Type 2 diabetes mellitus with diabetic neuropathy, unspecified: Secondary | ICD-10-CM | POA: Diagnosis present

## 2018-07-13 DIAGNOSIS — J441 Chronic obstructive pulmonary disease with (acute) exacerbation: Secondary | ICD-10-CM | POA: Diagnosis present

## 2018-07-13 DIAGNOSIS — J189 Pneumonia, unspecified organism: Secondary | ICD-10-CM | POA: Diagnosis not present

## 2018-07-13 DIAGNOSIS — I255 Ischemic cardiomyopathy: Secondary | ICD-10-CM

## 2018-07-13 DIAGNOSIS — W19XXXA Unspecified fall, initial encounter: Secondary | ICD-10-CM | POA: Diagnosis not present

## 2018-07-13 DIAGNOSIS — I5023 Acute on chronic systolic (congestive) heart failure: Secondary | ICD-10-CM | POA: Diagnosis present

## 2018-07-13 DIAGNOSIS — G4733 Obstructive sleep apnea (adult) (pediatric): Secondary | ICD-10-CM | POA: Diagnosis present

## 2018-07-13 DIAGNOSIS — A419 Sepsis, unspecified organism: Secondary | ICD-10-CM

## 2018-07-13 DIAGNOSIS — I5022 Chronic systolic (congestive) heart failure: Secondary | ICD-10-CM

## 2018-07-13 DIAGNOSIS — N400 Enlarged prostate without lower urinary tract symptoms: Secondary | ICD-10-CM | POA: Diagnosis present

## 2018-07-13 DIAGNOSIS — E039 Hypothyroidism, unspecified: Secondary | ICD-10-CM | POA: Diagnosis present

## 2018-07-13 DIAGNOSIS — J440 Chronic obstructive pulmonary disease with acute lower respiratory infection: Secondary | ICD-10-CM | POA: Diagnosis present

## 2018-07-13 DIAGNOSIS — R6521 Severe sepsis with septic shock: Secondary | ICD-10-CM

## 2018-07-13 DIAGNOSIS — R42 Dizziness and giddiness: Secondary | ICD-10-CM | POA: Diagnosis not present

## 2018-07-13 DIAGNOSIS — Z794 Long term (current) use of insulin: Secondary | ICD-10-CM

## 2018-07-13 DIAGNOSIS — I252 Old myocardial infarction: Secondary | ICD-10-CM

## 2018-07-13 DIAGNOSIS — Z8249 Family history of ischemic heart disease and other diseases of the circulatory system: Secondary | ICD-10-CM

## 2018-07-13 DIAGNOSIS — Z7901 Long term (current) use of anticoagulants: Secondary | ICD-10-CM

## 2018-07-13 DIAGNOSIS — Z823 Family history of stroke: Secondary | ICD-10-CM

## 2018-07-13 DIAGNOSIS — Z87730 Personal history of (corrected) cleft lip and palate: Secondary | ICD-10-CM

## 2018-07-13 DIAGNOSIS — Z825 Family history of asthma and other chronic lower respiratory diseases: Secondary | ICD-10-CM

## 2018-07-13 DIAGNOSIS — Z9981 Dependence on supplemental oxygen: Secondary | ICD-10-CM

## 2018-07-13 DIAGNOSIS — Z87891 Personal history of nicotine dependence: Secondary | ICD-10-CM

## 2018-07-13 DIAGNOSIS — Z951 Presence of aortocoronary bypass graft: Secondary | ICD-10-CM

## 2018-07-13 DIAGNOSIS — R0902 Hypoxemia: Secondary | ICD-10-CM | POA: Diagnosis present

## 2018-07-13 DIAGNOSIS — L409 Psoriasis, unspecified: Secondary | ICD-10-CM | POA: Diagnosis present

## 2018-07-13 DIAGNOSIS — I2510 Atherosclerotic heart disease of native coronary artery without angina pectoris: Secondary | ICD-10-CM | POA: Diagnosis present

## 2018-07-13 DIAGNOSIS — I110 Hypertensive heart disease with heart failure: Secondary | ICD-10-CM | POA: Diagnosis present

## 2018-07-13 DIAGNOSIS — Z8673 Personal history of transient ischemic attack (TIA), and cerebral infarction without residual deficits: Secondary | ICD-10-CM

## 2018-07-13 DIAGNOSIS — K219 Gastro-esophageal reflux disease without esophagitis: Secondary | ICD-10-CM | POA: Diagnosis present

## 2018-07-13 DIAGNOSIS — Z79899 Other long term (current) drug therapy: Secondary | ICD-10-CM

## 2018-07-13 DIAGNOSIS — R6883 Chills (without fever): Secondary | ICD-10-CM | POA: Diagnosis present

## 2018-07-13 DIAGNOSIS — E1165 Type 2 diabetes mellitus with hyperglycemia: Secondary | ICD-10-CM | POA: Diagnosis present

## 2018-07-13 DIAGNOSIS — Z888 Allergy status to other drugs, medicaments and biological substances status: Secondary | ICD-10-CM

## 2018-07-13 DIAGNOSIS — R6520 Severe sepsis without septic shock: Secondary | ICD-10-CM | POA: Diagnosis present

## 2018-07-13 DIAGNOSIS — Z87442 Personal history of urinary calculi: Secondary | ICD-10-CM

## 2018-07-13 DIAGNOSIS — Z79890 Hormone replacement therapy (postmenopausal): Secondary | ICD-10-CM

## 2018-07-13 DIAGNOSIS — R05 Cough: Secondary | ICD-10-CM | POA: Diagnosis not present

## 2018-07-13 LAB — COMPREHENSIVE METABOLIC PANEL
ALT: 15 U/L (ref 0–44)
AST: 26 U/L (ref 15–41)
Albumin: 2.9 g/dL — ABNORMAL LOW (ref 3.5–5.0)
Alkaline Phosphatase: 68 U/L (ref 38–126)
Anion gap: 11 (ref 5–15)
BUN: 17 mg/dL (ref 8–23)
CO2: 28 mmol/L (ref 22–32)
Calcium: 8.6 mg/dL — ABNORMAL LOW (ref 8.9–10.3)
Chloride: 98 mmol/L (ref 98–111)
Creatinine, Ser: 1.25 mg/dL — ABNORMAL HIGH (ref 0.61–1.24)
GFR calc Af Amer: >60 mL/min (ref >60)
GFR calc non Af Amer: 52 mL/min — ABNORMAL LOW (ref >60)
Glucose, Bld: 156 mg/dL — ABNORMAL HIGH (ref 70–99)
Potassium: 3.9 mmol/L (ref 3.5–5.1)
Sodium: 137 mmol/L (ref 135–145)
Total Bilirubin: 0.9 mg/dL (ref 0.3–1.2)
Total Protein: 6 g/dL — ABNORMAL LOW (ref 6.5–8.1)

## 2018-07-13 LAB — GLUCOSE, CAPILLARY
Glucose-Capillary: 191 mg/dL — ABNORMAL HIGH (ref 70–99)
Glucose-Capillary: 104 mg/dL — ABNORMAL HIGH (ref 70–99)

## 2018-07-13 LAB — CBC WITH DIFFERENTIAL/PLATELET
Abs Immature Granulocytes: 0.1 10*3/uL (ref 0.0–0.1)
Basophils Absolute: 0 10*3/uL (ref 0.0–0.1)
Basophils Relative: 0 %
Eosinophils Absolute: 0 10*3/uL (ref 0.0–0.7)
Eosinophils Relative: 0 %
HCT: 42 % (ref 39.0–52.0)
Hemoglobin: 13.9 g/dL (ref 13.0–17.0)
Immature Granulocytes: 1 %
Lymphocytes Relative: 5 %
Lymphs Abs: 0.8 10*3/uL (ref 0.7–4.0)
MCH: 31.4 pg (ref 26.0–34.0)
MCHC: 33.1 g/dL (ref 30.0–36.0)
MCV: 94.8 fL (ref 78.0–100.0)
Monocytes Absolute: 0.8 10*3/uL (ref 0.1–1.0)
Monocytes Relative: 5 %
Neutro Abs: 15.4 10*3/uL — ABNORMAL HIGH (ref 1.7–7.7)
Neutrophils Relative %: 89 %
Platelets: 153 10*3/uL (ref 150–400)
RBC: 4.43 MIL/uL (ref 4.22–5.81)
RDW: 14.5 % (ref 11.5–15.5)
WBC: 17.1 10*3/uL — ABNORMAL HIGH (ref 4.0–10.5)

## 2018-07-13 LAB — URINALYSIS, ROUTINE W REFLEX MICROSCOPIC
Bilirubin Urine: NEGATIVE
Glucose, UA: NEGATIVE mg/dL
Hgb urine dipstick: NEGATIVE
Ketones, ur: 5 mg/dL — AB
Leukocytes, UA: NEGATIVE
Nitrite: NEGATIVE
Protein, ur: NEGATIVE mg/dL
Specific Gravity, Urine: 1.019 (ref 1.005–1.030)
pH: 5 (ref 5.0–8.0)

## 2018-07-13 LAB — I-STAT CG4 LACTIC ACID, ED
Lactic Acid, Venous: 1.46 mmol/L (ref 0.5–1.9)
Lactic Acid, Venous: 2.78 mmol/L (ref 0.5–1.9)

## 2018-07-13 LAB — RESPIRATORY PANEL BY PCR

## 2018-07-13 LAB — PROTIME-INR
INR: 4.09
Prothrombin Time: 39.4 seconds — ABNORMAL HIGH (ref 11.4–15.2)

## 2018-07-13 LAB — TSH: TSH: 0.454 u[IU]/mL (ref 0.350–4.500)

## 2018-07-13 LAB — LACTIC ACID, PLASMA: Lactic Acid, Venous: 1.5 mmol/L (ref 0.5–1.9)

## 2018-07-13 LAB — PROCALCITONIN: Procalcitonin: 0.71 ng/mL

## 2018-07-13 LAB — STREP PNEUMONIAE URINARY ANTIGEN: Strep Pneumo Urinary Antigen: NEGATIVE

## 2018-07-13 MED ORDER — TIOTROPIUM BROMIDE MONOHYDRATE 18 MCG IN CAPS
1.0000 | ORAL_CAPSULE | Freq: Every day | RESPIRATORY_TRACT | Status: DC
  Administered 2018-07-14 – 2018-07-15 (×2): 18 ug via RESPIRATORY_TRACT
  Filled 2018-07-13: qty 5

## 2018-07-13 MED ORDER — VANCOMYCIN HCL IN DEXTROSE 750-5 MG/150ML-% IV SOLN: 750.0000 mg | Freq: Two times a day (BID) | INTRAVENOUS | Status: DC

## 2018-07-13 MED ORDER — FUROSEMIDE 80 MG PO TABS
80.0000 mg | ORAL_TABLET | Freq: Two times a day (BID) | ORAL | Status: DC
  Administered 2018-07-14: 80 mg via ORAL
  Filled 2018-07-13: qty 1

## 2018-07-13 MED ORDER — ACETAMINOPHEN 650 MG RE SUPP: 650.0000 mg | Freq: Four times a day (QID) | RECTAL | Status: DC | PRN

## 2018-07-13 MED ORDER — SPIRONOLACTONE 25 MG PO TABS
25.0000 mg | ORAL_TABLET | Freq: Every day | ORAL | Status: DC
  Administered 2018-07-14 – 2018-07-15 (×2): 25 mg via ORAL
  Filled 2018-07-13 (×2): qty 1

## 2018-07-13 MED ORDER — PREDNISONE 20 MG PO TABS: 60.0000 mg | ORAL_TABLET | Freq: Once | ORAL | Status: DC

## 2018-07-13 MED ORDER — ALBUTEROL SULFATE (2.5 MG/3ML) 0.083% IN NEBU: 2.5000 mg | INHALATION_SOLUTION | RESPIRATORY_TRACT | Status: DC | PRN

## 2018-07-13 MED ORDER — SODIUM CHLORIDE 0.9 % IV SOLN
INTRAVENOUS | Status: AC
  Administered 2018-07-13: 16:00:00 via INTRAVENOUS

## 2018-07-13 MED ORDER — ONDANSETRON HCL 4 MG/2ML IJ SOLN: 4.0000 mg | Freq: Four times a day (QID) | INTRAMUSCULAR | Status: DC | PRN

## 2018-07-13 MED ORDER — ONDANSETRON HCL 4 MG PO TABS: 4.0000 mg | ORAL_TABLET | Freq: Four times a day (QID) | ORAL | Status: DC | PRN

## 2018-07-13 MED ORDER — FLUTICASONE FUROATE-VILANTEROL 100-25 MCG/INH IN AEPB
1.0000 | INHALATION_SPRAY | Freq: Every day | RESPIRATORY_TRACT | Status: DC
  Administered 2018-07-14 – 2018-07-15 (×2): 1 via RESPIRATORY_TRACT
  Filled 2018-07-13: qty 28

## 2018-07-13 MED ORDER — SODIUM CHLORIDE 0.9 % IV SOLN: 2.0000 g | INTRAVENOUS | Status: DC

## 2018-07-13 MED ORDER — WARFARIN - PHARMACIST DOSING INPATIENT: Freq: Every day | Status: DC

## 2018-07-13 MED ORDER — INSULIN ASPART 100 UNIT/ML ~~LOC~~ SOLN: 0.0000 [IU] | Freq: Every day | SUBCUTANEOUS | Status: DC

## 2018-07-13 MED ORDER — LEVOTHYROXINE SODIUM 100 MCG PO TABS
100.0000 ug | ORAL_TABLET | Freq: Every day | ORAL | Status: DC
  Administered 2018-07-14 – 2018-07-15 (×2): 100 ug via ORAL
  Filled 2018-07-13 (×2): qty 1

## 2018-07-13 MED ORDER — FLUTICASONE PROPIONATE 50 MCG/ACT NA SUSP
2.0000 | Freq: Every day | NASAL | Status: DC
  Administered 2018-07-14 – 2018-07-15 (×2): 2 via NASAL
  Filled 2018-07-13: qty 16

## 2018-07-13 MED ORDER — SODIUM CHLORIDE 0.9 % IV BOLUS
500.0000 mL | Freq: Once | INTRAVENOUS | Status: AC
  Administered 2018-07-13: 500 mL via INTRAVENOUS

## 2018-07-13 MED ORDER — EZETIMIBE 10 MG PO TABS
10.0000 mg | ORAL_TABLET | Freq: Every day | ORAL | Status: DC
  Administered 2018-07-14 – 2018-07-15 (×2): 10 mg via ORAL
  Filled 2018-07-13 (×2): qty 1

## 2018-07-13 MED ORDER — TRAMADOL HCL 50 MG PO TABS: 50.0000 mg | ORAL_TABLET | Freq: Three times a day (TID) | ORAL | Status: DC | PRN

## 2018-07-13 MED ORDER — SENNA 8.6 MG PO TABS: 2.0000 | ORAL_TABLET | Freq: Every evening | ORAL | Status: DC | PRN

## 2018-07-13 MED ORDER — AMIODARONE HCL 200 MG PO TABS
200.0000 mg | ORAL_TABLET | Freq: Every day | ORAL | Status: DC
  Administered 2018-07-14 – 2018-07-15 (×2): 200 mg via ORAL
  Filled 2018-07-13 (×2): qty 1

## 2018-07-13 MED ORDER — ACETAMINOPHEN 325 MG PO TABS: 650.0000 mg | ORAL_TABLET | Freq: Four times a day (QID) | ORAL | Status: DC | PRN

## 2018-07-13 MED ORDER — GUAIFENESIN ER 600 MG PO TB12
600.0000 mg | ORAL_TABLET | Freq: Two times a day (BID) | ORAL | Status: DC
  Administered 2018-07-13 – 2018-07-15 (×4): 600 mg via ORAL
  Filled 2018-07-13 (×5): qty 1

## 2018-07-13 MED ORDER — INSULIN GLARGINE 100 UNIT/ML ~~LOC~~ SOLN
18.0000 [IU] | Freq: Every day | SUBCUTANEOUS | Status: DC
  Administered 2018-07-14 – 2018-07-15 (×2): 18 [IU] via SUBCUTANEOUS
  Filled 2018-07-13 (×2): qty 0.18

## 2018-07-13 MED ORDER — VANCOMYCIN HCL IN DEXTROSE 1-5 GM/200ML-% IV SOLN
1000.0000 mg | Freq: Once | INTRAVENOUS | Status: AC
  Administered 2018-07-13: 1000 mg via INTRAVENOUS
  Filled 2018-07-13: qty 200

## 2018-07-13 MED ORDER — ACETAMINOPHEN 325 MG PO TABS
650.0000 mg | ORAL_TABLET | Freq: Once | ORAL | Status: AC
  Administered 2018-07-13: 650 mg via ORAL
  Filled 2018-07-13: qty 2

## 2018-07-13 MED ORDER — SODIUM CHLORIDE 0.9% FLUSH
3.0000 mL | Freq: Two times a day (BID) | INTRAVENOUS | Status: DC
  Administered 2018-07-13 – 2018-07-14 (×3): 3 mL via INTRAVENOUS

## 2018-07-13 MED ORDER — FERROUS SULFATE 325 (65 FE) MG PO TABS
325.0000 mg | ORAL_TABLET | Freq: Every evening | ORAL | Status: DC
  Administered 2018-07-13 – 2018-07-14 (×2): 325 mg via ORAL
  Filled 2018-07-13 (×2): qty 1

## 2018-07-13 MED ORDER — FLUTICASONE-UMECLIDIN-VILANT 100-62.5-25 MCG/INH IN AEPB: 1.0000 | INHALATION_SPRAY | Freq: Every day | RESPIRATORY_TRACT | Status: DC

## 2018-07-13 MED ORDER — SODIUM CHLORIDE 0.9 % IV SOLN
2.0000 g | Freq: Once | INTRAVENOUS | Status: AC
  Administered 2018-07-13: 2 g via INTRAVENOUS
  Filled 2018-07-13: qty 2

## 2018-07-13 MED ORDER — SODIUM CHLORIDE 0.9 % IV SOLN
500.0000 mg | INTRAVENOUS | Status: DC
  Administered 2018-07-14: 500 mg via INTRAVENOUS
  Filled 2018-07-13: qty 500

## 2018-07-13 MED ORDER — ATORVASTATIN CALCIUM 80 MG PO TABS
80.0000 mg | ORAL_TABLET | Freq: Every day | ORAL | Status: DC
  Administered 2018-07-13 – 2018-07-14 (×2): 80 mg via ORAL
  Filled 2018-07-13 (×2): qty 1

## 2018-07-13 MED ORDER — INSULIN ASPART 100 UNIT/ML ~~LOC~~ SOLN
4.0000 [IU] | Freq: Three times a day (TID) | SUBCUTANEOUS | Status: DC
  Administered 2018-07-13 – 2018-07-15 (×6): 4 [IU] via SUBCUTANEOUS

## 2018-07-13 MED ORDER — INSULIN DEGLUDEC 100 UNIT/ML ~~LOC~~ SOPN: 18.0000 [IU] | PEN_INJECTOR | Freq: Every day | SUBCUTANEOUS | Status: DC

## 2018-07-13 MED ORDER — ENSURE ENLIVE PO LIQD
237.0000 mL | Freq: Two times a day (BID) | ORAL | Status: DC
  Administered 2018-07-14: 237 mL via ORAL

## 2018-07-13 MED ORDER — CARVEDILOL 6.25 MG PO TABS
6.2500 mg | ORAL_TABLET | Freq: Two times a day (BID) | ORAL | Status: DC
  Administered 2018-07-13 – 2018-07-14 (×2): 6.25 mg via ORAL
  Filled 2018-07-13 (×2): qty 1

## 2018-07-13 MED ORDER — SODIUM CHLORIDE 0.9 % IV SOLN: 1.0000 g | INTRAVENOUS | Status: DC

## 2018-07-13 NOTE — ED Notes (Signed)
Pt given urinal and is aware that a urine sample is needed.

## 2018-07-13 NOTE — Progress Notes (Signed)
Remote ICD transmission.   

## 2018-07-13 NOTE — ED Triage Notes (Signed)
Pt from home, woke up with chills and not feeling well, wife took bp's of 86/48 and 77/55.  EMS got 110/60 but dropped to 86/50 with movement, 500 ml bolus given.  89% RA put on 4L- 97%.  RR 22, P 90-paced. Decreased right lower lung sounds-hx of pneumonia.

## 2018-07-13 NOTE — H&P (Addendum)
History and Physical    [name] DOB: 1934/08/24 DOA: 07/13/2018  Referring MD/NP/PA: Delia Heady, PA-C PCP: [name], MD  Patient coming from: home via EMS  Chief Complaint: Chills and low blood pressure  I have personally briefly reviewed patient's old medical records in [place]   HPI: [name] is a 84 y.o. male with medical history significant of Ischemic cardiomyopathy EF 35 -40 %, A fib  s/p BiV PM on coumadin, COPD, not on home oxygen; who presented with complaints of chills and low blood pressure.  Symptoms started this morning around 2:30 AM after he had gone to use the restroom.  He reported feeling cold all over.  He had gotten back into bed and try putting on more close without relief of symptoms.  This morning when he checked his blood pressure noted they were low 69/43 - 102/52 and normally his systolic blood pressure runs in the 140s to 150s.  He called his cardiologist office regarding symptoms.  Other associated symptoms include elevated blood sugars of 192, weight loss 70 pounds since his last hospitalization seems over a year ago, and chronic productive cough with clear to yellowish sputum production that had not changed.  Denies any recent change in medications, leg swelling, orthopnea, chest pain, nausea, vomiting, diarrhea, abdominal pain, or dysuria.  Lastly, patient admits that he feels like food goes down the wrong way sometimes with eating but notes that this is not frequently occur.  ED Course: Upon admission into the emergency department patient was noted to be febrile up to 100.9 F, pulse 40-70, respirations 15-26, blood pressure 100/54-127/56, O2 saturations 88 to 97% after being placed on 2 L of nasal cannula oxygen.  Labs revealed WBC 17.1, creatinine 1.25, and lactic acid 4.09.  Chest x-ray showed chronic changes of the right lung base with new appearing infiltrate.  Patient had been started on empiric antibiotics of  vancomycin and cefepime for sepsis unknown source and bolused with 500 mL of normal saline IV fluids.  TRH called to admit.  Review of Systems  Constitutional: Positive for chills and malaise/fatigue.  HENT: Positive for hearing loss. Negative for sinus pain.   Eyes: Negative for pain and discharge.  Respiratory: Positive for cough and sputum production.   Cardiovascular: Negative for chest pain and leg swelling.  Gastrointestinal: Negative for abdominal pain, nausea and vomiting.  Genitourinary: Negative for dysuria and frequency.  Musculoskeletal: Positive for falls (Last fall 3-4 weeks ago). Negative for myalgias.  Neurological: Positive for weakness.  Endo/Heme/Allergies: Negative for polydipsia. Bruises/bleeds easily.  Psychiatric/Behavioral: Negative for memory loss and substance abuse.    Past Medical History:  Diagnosis Date  . Atrial fibrillation ([place])    persistent, previously seen at [facility] and placed on amiodarone  . Benign prostatic hypertrophy   . CAD (coronary artery disease)    multivessel s/p inferolateral wall MI with subsequent CABG 11/1998.  Cath 2009 with Patent grafts  . Cleft palate   . COPD with emphysema ([place]) 04/01/2010  . DM (diabetes mellitus), type 2 ([place])   . Dyspnea   . GERD (gastroesophageal reflux disease)   . HTN (hypertension)   . Hyperlipidemia   . Hypothyroidism   . Iron deficiency anemia   . Ischemic dilated cardiomyopathy ([place])    EF 35-40% by MUGA 6/11  . Myocardial infarction ([place])   . Nasal septal deviation   . Nephrolithiasis   . OSA (obstructive sleep apnea)   . PAF (paroxysmal atrial  fibrillation) ([place])   . Peripheral arterial disease ([facility])    left subclavian artery stenosis  . PNA (pneumonia)   . Psoriasis   . Seborrheic keratosis   . Stroke ([place])   . Systolic congestive heart failure ([place]) 2009   s/p BiV ICD implantation by Dr Leonia Reeves (MDT)    Past Surgical History:  Procedure Laterality Date  . BI-VENTRICULAR IMPLANTABLE  CARDIOVERTER DEFIBRILLATOR  (CRT-D)  10-08-08; 11-06-2013   Dr Leonia Reeves (MDT) implant for primary prevention; gen change to MDT VivaXT CRTD by Dr Rayann Heman  . BIV ICD GENERTAOR CHANGE OUT N/A 11/06/2013   Procedure: BIV ICD GENERTAOR CHANGE OUT;  Surgeon: Coralyn Mark, MD;  Location: [facility] CATH LAB;  Service: Cardiovascular;  Laterality: N/A;  . c-spine surgery    . CARDIOVERSION N/A 04/29/2016   Procedure: CARDIOVERSION;  Surgeon: Pixie Casino, MD;  Location: [facility] ENDOSCOPY;  Service: Cardiovascular;  Laterality: N/A;  . CARPAL TUNNEL RELEASE    . CATARACT EXTRACTION    . CORONARY ARTERY BYPASS GRAFT     LIMA to LAD, SVG to OM, SVG to diagonal  . ELECTROPHYSIOLOGIC STUDY N/A 06/11/2016   Procedure: Atrial Fibrillation Ablation;  Surgeon: Thompson Grayer, MD;  Location: [place] CV LAB;  Service: Cardiovascular;  Laterality: N/A;  . left cleft palate and left cleft lip repair       reports that he quit smoking about 19 years ago. His smoking use included cigarettes. He has a 195.00 pack-year smoking history. He has never used smokeless tobacco. He reports that he does not drink alcohol or use drugs.  Allergies  Allergen Reactions  . Aldactone [Spironolactone] Other (See Comments)    Hyperkalemia  reported by Dr. [name] - pt is currently taking 12.5 mg daily -November 2017 per medication list by same MD    Family History  Problem Relation Age of Onset  . Asthma Father   . Stroke Father   . Hypertension Father   . Hypertension Mother   . Heart disease Brother   . Heart attack Brother   . Hypertension Sister   . Hypertension Brother     Prior to Admission medications   Medication Sig Start Date End Date Taking? Authorizing Provider  albuterol (PROVENTIL HFA;VENTOLIN HFA) 108 (90 BASE) MCG/ACT inhaler Inhale 2 puffs into the lungs every 6 (six) hours as needed for wheezing or shortness of breath. Must keep June 2016 appt. 03/04/15  Yes Chesley Mires, MD  albuterol (PROVENTIL) (2.5  MG/3ML) 0.083% nebulizer solution USE 1 VIAL VIA NEBULIZER 3 TIMES A DAY AS DIRECTED. DX:J44.9 12/15/17  Yes Chesley Mires, MD  amiodarone (PACERONE) 400 MG tablet TAKE HALF TABLET (200 MG TOTAL) BY MOUTH DAILY. 02/21/18  Yes Allred, Jeneen Rinks, MD  atorvastatin (LIPITOR) 80 MG tablet TAKE 1 TABLET (80 MG TOTAL) BY MOUTH DAILY. 07/10/18  Yes Jettie Booze, MD  bisacodyl (DULCOLAX) 5 MG EC tablet Take 2 tablets (10 mg total) by mouth daily at 12 noon. 09/06/16  Yes Regalado, Belkys A, MD  carvedilol (COREG) 6.25 MG tablet TAKE 1 TABLET BY MOUTH TWICE A DAY 05/02/18  Yes Jettie Booze, MD  cholecalciferol (VITAMIN D) 1000 units tablet Take 2,000 Units by mouth every evening.   Yes [provider]  dextromethorphan-guaiFENesin (MUCINEX DM) 30-600 MG 12hr tablet Take 1 tablet by mouth 2 (two) times daily as needed for cough.   Yes [provider]  ezetimibe (ZETIA) 10 MG tablet Take 10 mg by mouth daily.   Yes  [provider]  ferrous sulfate 325 (65 FE) MG tablet Take 325 mg by mouth every evening.    Yes [provider]  fluticasone (FLONASE) 50 MCG/ACT nasal spray SPRAY 2 SPRAYS INTO EACH NOSTRIL EVERY DAY 05/22/18  Yes Chesley Mires, MD  furosemide (LASIX) 40 MG tablet Take 80 mg by mouth 2 (two) times daily.    Yes [provider]  insulin aspart (NOVOLOG FLEXPEN) 100 UNIT/ML FlexPen Inject 4 Units into the skin 3 (three) times daily before meals.    Yes [provider]  insulin degludec (TRESIBA FLEXTOUCH) 100 UNIT/ML SOPN FlexTouch Pen Inject 0.3 mLs (30 Units total) into the skin daily before lunch. Patient taking differently: Inject 18 Units into the skin daily before lunch.  09/06/16  Yes Regalado, Belkys A, MD  levothyroxine (SYNTHROID, LEVOTHROID) 100 MCG tablet Take 100 mcg by mouth daily before breakfast.  09/23/15  Yes [provider]  Multiple Vitamins-Minerals (PRESERVISION AREDS PO) Take 1 tablet by mouth 2 (two) times  daily.   Yes [provider]  PRESCRIPTION MEDICATION Inhale into the lungs at bedtime. CPAP   Yes [provider]  senna (SENOKOT) 8.6 MG tablet Take 2 tablets by mouth at bedtime.    Yes [provider]  SPIRIVA RESPIMAT 2.5 MCG/ACT AERS INHALE 2 PUFFS INTO THE LUNGS DAILY. 04/11/17  Yes Chesley Mires, MD  spironolactone (ALDACTONE) 25 MG tablet TAKE 1/2 TABLET (12.5 MG TOTAL) BY MOUTH DAILY. 06/01/18  Yes Jettie Booze, MD  traMADol (ULTRAM) 50 MG tablet Take 50 mg by mouth 3 (three) times daily as needed for moderate pain.  06/07/18  Yes [provider]  TRELEGY ELLIPTA 100-62.5-25 MCG/INH AEPB INHALE 1 PUFF INTO LUNGS ONCE A DAY 07/06/18  Yes Chesley Mires, MD  vitamin B-12 (CYANOCOBALAMIN) 1000 MCG tablet Take 1,000 mcg by mouth daily.   Yes [provider]  warfarin (COUMADIN) 5 MG tablet TAKE AS DIRECTED BY COUMADIN CLINIC Patient taking differently: Take 2.5-5 mg by mouth at bedtime. Takes a full tablet daily except for Wed. Pt takes half a tablet (2.10m) 03/31/18  Yes VJettie Booze MD    Physical Exam:  Constitutional: Elderly male in who appears to be unable to follow commands Vitals:   07/13/18 1330 07/13/18 1353 07/13/18 1355 07/13/18 1400  BP: 123/75 (!) 127/56  (!) 118/57  Pulse: (!) 44  69 70  Resp: 20 16 (!) 22 16  Temp:      TempSrc:      SpO2: (!) 82%  94% 95%  Weight:      Height:       Eyes: PERRL, lids and conjunctivae normal ENMT: Mucous membranes are moist. Posterior pharynx clear of any exudate or lesions. .  Neck: normal, supple, no masses, no thyromegaly Respiratory: Decreased overall aeration with positive rhonchi heard in the right lower lung field.  No significant wheezes appreciated.  Cardiovascular: Regular rate and rhythm, no murmurs / rubs / gallops. No extremity edema. 2+ pedal pulses. No carotid bruits.  Abdomen: no tenderness, no masses palpated. No hepatosplenomegaly. Bowel sounds positive.    Musculoskeletal: no clubbing / cyanosis. No joint deformity upper and lower extremities. Good ROM, no contractures. Normal muscle tone.  Skin: no rashes, lesions, ulcers. No induration Neurologic: CN 2-12 grossly intact. Sensation intact, DTR normal. Strength 5/5 in all 4.  Psychiatric: Normal judgment and insight. Alert and oriented x 3. Normal mood.     Labs on Admission: I have personally reviewed following  labs and imaging studies  CBC: Recent Labs  Lab 07/13/18 1233  WBC 17.1*  NEUTROABS 15.4*  HGB 13.9  HCT 42.0  MCV 94.8  PLT 144   Basic Metabolic Panel: Recent Labs  Lab 07/13/18 1233  NA 137  K 3.9  CL 98  CO2 28  GLUCOSE 156*  BUN 17  CREATININE 1.25*  CALCIUM 8.6*   GFR: Estimated Creatinine Clearance: 49.6 mL/min (A) (by C-G formula based on SCr of 1.25 mg/dL (H)). Liver Function Tests: Recent Labs  Lab 07/13/18 1233  AST 26  ALT 15  ALKPHOS 68  BILITOT 0.9  PROT 6.0*  ALBUMIN 2.9*   No results for input(s): LIPASE, AMYLASE in the last 168 hours. No results for input(s): AMMONIA in the last 168 hours. Coagulation Profile: Recent Labs  Lab 07/13/18 1233  INR 4.09*   Cardiac Enzymes: No results for input(s): CKTOTAL, CKMB, CKMBINDEX, TROPONINI in the last 168 hours. BNP (last 3 results) Recent Labs    02/27/18 1045 03/06/18 0752 03/13/18 0805  PROBNP 10,884* 12,873* 5,305*   HbA1C: No results for input(s): HGBA1C in the last 72 hours. CBG: No results for input(s): GLUCAP in the last 168 hours. Lipid Profile: No results for input(s): CHOL, HDL, LDLCALC, TRIG, CHOLHDL, LDLDIRECT in the last 72 hours. Thyroid Function Tests: No results for input(s): TSH, T4TOTAL, FREET4, T3FREE, THYROIDAB in the last 72 hours. Anemia Panel: No results for input(s): VITAMINB12, FOLATE, FERRITIN, TIBC, IRON, RETICCTPCT in the last 72 hours. Urine analysis:    Component Value Date/Time   COLORURINE YELLOW 05/04/2018 2016   APPEARANCEUR CLEAR  05/04/2018 2016   LABSPEC 1.014 05/04/2018 2016   PHURINE 5.0 05/04/2018 2016   GLUCOSEU NEGATIVE 05/04/2018 2016   HGBUR NEGATIVE 05/04/2018 2016   BILIRUBINUR NEGATIVE 05/04/2018 2016   KETONESUR NEGATIVE 05/04/2018 2016   PROTEINUR NEGATIVE 05/04/2018 2016   UROBILINOGEN 0.2 11/28/2008 0325   NITRITE NEGATIVE 05/04/2018 2016   LEUKOCYTESUR NEGATIVE 05/04/2018 2016   Sepsis Labs: No results found for this or any previous visit (from the past 240 hour(s)).   Radiological Exams on Admission: Dg Chest Port 1 View  Result Date: 07/13/2018 CLINICAL DATA:  Three year history of productive cough. History of COPD-emphysema. Former smoker. History of coronary artery disease. EXAM: PORTABLE CHEST 1 VIEW COMPARISON:  Chest x-ray of Mar 02, 2018 FINDINGS: There is chronic volume loss on the right. There is new increased interstitial density in the mid and lower right lung. The left lung is mildly hyperinflated and clear. The cardiac silhouette is enlarged. The pulmonary vascularity is mildly prominent though stable. The ICD is in stable position. There is calcification in the wall of the aortic arch. The visualized sternal wires are intact. IMPRESSION: COPD. Chronic changes at the right lung base which appears stable. New interstitial infiltrate in the right lung worrisome for pneumonia. Followup PA and lateral chest X-ray is recommended in 3-4 weeks following trial of antibiotic therapy to ensure resolution and exclude underlying malignancy. Mild enlargement of the cardiac silhouette and pulmonary vascular prominence, stable. The ICD is in stable position. Previous CABG. Thoracic aortic atherosclerosis. Electronically Signed   By: David  Martinique M.D.   On: 07/13/2018 12:58    EKG: Independently reviewed.  Paced rhythm  Assessment/Plan Severe sepsis 2/2 community-acquired pneumonia: Acute.  Patient presented with chills and chronic cough found to have fever of 100.9 F with WBC elevated at 17.1, and  initial lactate elevated at 4.09.  Chest x-ray showing signs of right  lower lobe pneumonia.  Initially given vancomycin, cefepime, and 500 mL of normal saline IV fluids.  Patient had not recently been in the hospital.  Suspect community-acquired pneumonia versus aspiration versus viral upper respiratory infection. - Admit to a telemetry bed - Aspiration precaution - Follow-up blood and sputum cultures - Check respiratory virus panel - Add on procalcitonin - De-escalate antibiotics to Rocephin and azithromycin - Pulmonary toilet per respiratory therapy - Speech therapy to evaluation given possible aspiration  Hypoxia: Acute.  Initial O2 sats noted to be as low as 82% - Continuous pulse oximetry with nasal cannula oxygen as needed  COPD: Patient appears without acute exacerbation. - Continue Trelegy and Spiriva - Albuterol nebs every 4 hours as needed for shortness of breath/wheezing  Hypotension, history of essential hypertension: Appears patient had numerous blood pressure readings prior to arrival including 69/43, 77/55 , 102/52, and 86/48.  Patient had been given 500 mL of normal saline IV fluids upon arrival with blood pressures noted to be stable since that time.  At baseline patient reports systolic blood pressures in the 140s and 150s. - Normal saline IV fluids at 75 mL/h for 7 hours - Continue Coreg and aldactone as tolerated - Hold nighttime dose of furosemide and restart in a.m.  Atrial fibrillation s/p biventricular pacemaker with supratherapeutic INR: Heart rate controlled.  Initial INR noted to be 4.09 on admission.  Patient status post pacemaker followed by Dr. Rayann Heman and Irish Lack.  Pacemaker which was interrogated last month noted to be functioning within normal limits. - Daily PT/INR - Continue amiodarone - Hold Coumadin - Coumadin per pharmacy  Weight loss: Patient reports weight loss over 70 pounds over the last year.  Patient reports decreased appetite. - Follow-up  thyroid studies - Check ESR in a.m. - May warrant CT scan of the chest given remote history of tobacco abuse.  Ischemic dilated cardiomyopathy, chronic systolic CHF: Last EF noted to be 35 to 40% with grade 2 diastolic dysfunction and 3 of 2017. - Strict I&O's and daily weight  Diabetes mellitus type 2: Patient reports blood glucose elevated at home prior to arrival.  Glucose 157 on admission.  Last hemoglobin A1c noted to be 7.7 from 01/15/2016. - Hypoglycemic protocols - CBGs q. before meals and at bedtime  - Continue home regimen 18 units Tresiba prior to lunch - Continue home regimen of NovoLog 4 units with meals  Chronic kidney disease stage III: Creatinine 1.2 on admission which appears likely near or improved from previous baseline. - Continue to monitor  Hypothyroidism - Add on TSH - Continue levothyroxine  Hyperlipidemia - Continue Lipitor and Zetia  OSA on CPAP - Continue CPAP  DVT prophylaxis:Coumadin  Code Status: full Family Communication: Discussed plan of care with the patient family present at bedside Disposition Plan: Likely discharge home in 2 to 3 days once medically stable Consults called: none  Admission status: Inpatient  Norval Morton MD [facility] Pager [phone]   If 7PM-7AM, please contact night-coverage [email or web address] Password [email or web address]  07/13/2018, 2:41 PM

## 2018-07-13 NOTE — Progress Notes (Signed)
Received report from [place], [place] in ED @ [phone]. Kayla to get clarification on the 2 orders for Vitals

## 2018-07-13 NOTE — Telephone Encounter (Signed)
Pt wife (on [place]) called to report that the pt woke up at 4am feeling very cold so she put an extra blanket on him. At 6am his BP was 69/43.[name] He was still very cold but alert and complaining of feeling very tired and some dizziness. He had some hot chocolate and water and his BP went up to 102/52 but she checked it again an hour later and it was back down to 86/48.Marland Kitchenand HR 71 and O2SAT 98%..She said his BS has been running high and he recenlty had a fall due to dizziness. She is tearful worrying about him. I advised her that it would be good for her to call EMS and have them help her take him to the ER. I advised her to be careful getting him up that he was at risk for passing out and falling. She verbalized understanding and agreed to call EMS.

## 2018-07-13 NOTE — Progress Notes (Signed)
ANTICOAGULATION CONSULT NOTE - Follow Up Consult  Pharmacy Consult for Warfarin Indication: atrial fibrillation  Allergies  Allergen Reactions  . Aldactone [Spironolactone] Other (See Comments)    Hyperkalemia  reported by Dr. [name] - pt is currently taking 12.5 mg daily -November 2017 per medication list by same MD    Patient Measurements: Height: 5\' 6"  (167.6 cm) Weight: 203 lb 1.6 oz (92.1 kg) IBW/kg (Calculated) : 63.8  Vital Signs: Temp: 97.8 F (36.6 C) (09/19 1604) Temp Source: Oral (09/19 1604) BP: 123/58 (09/19 1604) Pulse Rate: 69 (09/19 1604)  Labs: Recent Labs    07/13/18 1233  HGB 13.9  HCT 42.0  PLT 153  LABPROT 39.4*  INR 4.09*  CREATININE 1.25*    Estimated Creatinine Clearance: 48.4 mL/min (A) (by C-G formula based on SCr of 1.25 mg/dL (H)).  Assessment: 84 year old male on warfarin prior to admission for Afib Admitted with pneumonia/sepsis INR elevated on admission at 4.09  Goal of Therapy:  INR 2-3 Monitor platelets by anticoagulation protocol: Yes   Plan:  Hold warfarin tonight Daily INR  Thank you Anette Guarneri, PharmD [phone]  07/13/2018,4:07 PM

## 2018-07-13 NOTE — ED Provider Notes (Addendum)
[facility] EMERGENCY DEPARTMENT Provider Note   CSN: [id number] Arrival date & time: 07/13/18  1210     History   Chief Complaint Chief Complaint  Patient presents with  . Fatigue  . Hypotension    HPI [name] is a 84 y.o. male with past medical history of COPD, type 2 diabetes, hypertension, hyperlipidemia, CAD, atrial fibrillation who presents to ED for evaluation of 1 day history of low blood pressure and high blood sugars.  Patient woke up feeling like he had the chills.  Patient is unable to tell me what exactly he feels like.  He does report a cough productive with green mucus for about 1 week.  His wife has been checking his blood pressure which range from 81-19 systolic.  She called his cardiologist office and was advised to come to the ED.  He denies any pain, specifically denies any chest pain, headache, abdominal pain.  Denies any changes in appetite.  No sick contacts with similar symptoms.  Patient seen and evaluated on 8/29 for fall and sutures were placed.  He states that he has felt better since his fall.  HPI  Past Medical History:  Diagnosis Date  . Atrial fibrillation ([place])    persistent, previously seen at [facility] and placed on amiodarone  . Benign prostatic hypertrophy   . CAD (coronary artery disease)    multivessel s/p inferolateral wall MI with subsequent CABG 11/1998.  Cath 2009 with Patent grafts  . Cleft palate   . COPD with emphysema ([place]) 04/01/2010  . DM (diabetes mellitus), type 2 ([place])   . Dyspnea   . GERD (gastroesophageal reflux disease)   . HTN (hypertension)   . Hyperlipidemia   . Hypothyroidism   . Iron deficiency anemia   . Ischemic dilated cardiomyopathy ([place])    EF 35-40% by MUGA 6/11  . Myocardial infarction ([place])   . Nasal septal deviation   . Nephrolithiasis   . OSA (obstructive sleep apnea)   . PAF (paroxysmal atrial fibrillation) ([place])   . Peripheral arterial disease (HCC)    left subclavian artery  stenosis  . PNA (pneumonia)   . Psoriasis   . Seborrheic keratosis   . Stroke ([place])   . Systolic congestive heart failure ([place]) 2009   s/p BiV ICD implantation by Dr Leonia Reeves (MDT)    Patient Active Problem List   Diagnosis Date Noted  . Community acquired pneumonia 09/03/2016  . Acute respiratory failure ([place]) 09/03/2016  . SOB (shortness of breath)   . A-fib ([place]) 06/11/2016  . Atrial fibrillation, persistent ([place])   . Allergic rhinitis 02/05/2016  . Long term (current) use of anticoagulants [Z79.01] 01/19/2016  . Chronic systolic heart failure ([place])   . Acute respiratory failure with hypoxia ([place]) 01/11/2016  . Acute on chronic systolic CHF (congestive heart failure), NYHA class 3 ([place]) 01/11/2016  . Demand ischemia ([place]) 01/11/2016  . Acute renal failure superimposed on stage 3 chronic kidney disease ([place]) 01/11/2016  . Diabetes mellitus with diabetic neuropathy, with long-term current use of insulin ([place]) 01/11/2016  . Morbid obesity due to excess calories ([place]) 01/11/2016  . COPD (chronic obstructive pulmonary disease) ([place]) 01/10/2016  . Coronary artery disease   . Ischemic dilated cardiomyopathy ([place])   . PAF (paroxysmal atrial fibrillation) ([place])   . OSA (obstructive sleep apnea)   . Peripheral vascular disease ([place]) 10/02/2013  . Long term current use of anticoagulant therapy 08/21/2013  . Essential hypertension 04/01/2010  . Automatic implantable  cardioverter-defibrillator in situ 03/31/2010    Past Surgical History:  Procedure Laterality Date  . BI-VENTRICULAR IMPLANTABLE CARDIOVERTER DEFIBRILLATOR  (CRT-D)  10-08-08; 11-06-2013   Dr Leonia Reeves (MDT) implant for primary prevention; gen change to MDT VivaXT CRTD by Dr Rayann Heman  . BIV ICD GENERTAOR CHANGE OUT N/A 11/06/2013   Procedure: BIV ICD GENERTAOR CHANGE OUT;  Surgeon: Coralyn Mark, MD;  Location: [facility] CATH LAB;  Service: Cardiovascular;  Laterality: N/A;  . c-spine surgery    . CARDIOVERSION N/A 04/29/2016    Procedure: CARDIOVERSION;  Surgeon: Pixie Casino, MD;  Location: [facility] ENDOSCOPY;  Service: Cardiovascular;  Laterality: N/A;  . CARPAL TUNNEL RELEASE    . CATARACT EXTRACTION    . CORONARY ARTERY BYPASS GRAFT     LIMA to LAD, SVG to OM, SVG to diagonal  . ELECTROPHYSIOLOGIC STUDY N/A 06/11/2016   Procedure: Atrial Fibrillation Ablation;  Surgeon: Thompson Grayer, MD;  Location: [facility] CV LAB;  Service: Cardiovascular;  Laterality: N/A;  . left cleft palate and left cleft lip repair          Home Medications    Prior to Admission medications   Medication Sig Start Date End Date Taking? Authorizing Provider  albuterol (PROVENTIL HFA;VENTOLIN HFA) 108 (90 BASE) MCG/ACT inhaler Inhale 2 puffs into the lungs every 6 (six) hours as needed for wheezing or shortness of breath. Must keep June 2016 appt. 03/04/15  Yes Chesley Mires, MD  albuterol (PROVENTIL) (2.5 MG/3ML) 0.083% nebulizer solution USE 1 VIAL VIA NEBULIZER 3 TIMES A DAY AS DIRECTED. DX:J44.9 12/15/17  Yes Chesley Mires, MD  amiodarone (PACERONE) 400 MG tablet TAKE HALF TABLET (200 MG TOTAL) BY MOUTH DAILY. 02/21/18  Yes Allred, Jeneen Rinks, MD  atorvastatin (LIPITOR) 80 MG tablet TAKE 1 TABLET (80 MG TOTAL) BY MOUTH DAILY. 07/10/18  Yes Jettie Booze, MD  bisacodyl (DULCOLAX) 5 MG EC tablet Take 2 tablets (10 mg total) by mouth daily at 12 noon. 09/06/16  Yes Regalado, Belkys A, MD  carvedilol (COREG) 6.25 MG tablet TAKE 1 TABLET BY MOUTH TWICE A DAY 05/02/18  Yes Jettie Booze, MD  cholecalciferol (VITAMIN D) 1000 units tablet Take 2,000 Units by mouth every evening.   Yes [provider]  dextromethorphan-guaiFENesin (MUCINEX DM) 30-600 MG 12hr tablet Take 1 tablet by mouth 2 (two) times daily as needed for cough.   Yes [provider]  ezetimibe (ZETIA) 10 MG tablet Take 10 mg by mouth daily.   Yes [provider]  ferrous sulfate 325 (65 FE) MG tablet Take 325 mg by mouth every evening.    Yes [provider]  fluticasone (FLONASE) 50 MCG/ACT nasal spray SPRAY 2 SPRAYS INTO EACH NOSTRIL EVERY DAY 05/22/18  Yes Chesley Mires, MD  furosemide (LASIX) 40 MG tablet Take 80 mg by mouth 2 (two) times daily.    Yes [provider]  insulin aspart (NOVOLOG FLEXPEN) 100 UNIT/ML FlexPen Inject 4 Units into the skin 3 (three) times daily before meals.    Yes [provider]  insulin degludec (TRESIBA FLEXTOUCH) 100 UNIT/ML SOPN FlexTouch Pen Inject 0.3 mLs (30 Units total) into the skin daily before lunch. Patient taking differently: Inject 18 Units into the skin daily before lunch.  09/06/16  Yes Regalado, Belkys A, MD  levothyroxine (SYNTHROID, LEVOTHROID) 100 MCG tablet Take 100 mcg by mouth daily before breakfast.  09/23/15  Yes [provider]  Multiple Vitamins-Minerals (PRESERVISION AREDS PO) Take 1 tablet by mouth 2 (two) times  daily.   Yes [provider]  PRESCRIPTION MEDICATION Inhale into the lungs at bedtime. CPAP   Yes [provider]  senna (SENOKOT) 8.6 MG tablet Take 2 tablets by mouth at bedtime.    Yes [provider]  SPIRIVA RESPIMAT 2.5 MCG/ACT AERS INHALE 2 PUFFS INTO THE LUNGS DAILY. 04/11/17  Yes Chesley Mires, MD  spironolactone (ALDACTONE) 25 MG tablet TAKE 1/2 TABLET (12.5 MG TOTAL) BY MOUTH DAILY. 06/01/18  Yes Jettie Booze, MD  traMADol (ULTRAM) 50 MG tablet Take 50 mg by mouth 3 (three) times daily as needed for moderate pain.  06/07/18  Yes [provider]  TRELEGY ELLIPTA 100-62.5-25 MCG/INH AEPB INHALE 1 PUFF INTO LUNGS ONCE A DAY 07/06/18  Yes Chesley Mires, MD  vitamin B-12 (CYANOCOBALAMIN) 1000 MCG tablet Take 1,000 mcg by mouth daily.   Yes [provider]  warfarin (COUMADIN) 5 MG tablet TAKE AS DIRECTED BY COUMADIN CLINIC Patient taking differently: Take 2.5-5 mg by mouth at bedtime. Takes a full tablet daily except for Wed. Pt takes half a tablet (2.5mg ) 03/31/18  Yes Jettie Booze, MD      Family History Family History  Problem Relation Age of Onset  . Asthma Father   . Stroke Father   . Hypertension Father   . Hypertension Mother   . Heart disease Brother   . Heart attack Brother   . Hypertension Sister   . Hypertension Brother     Social History Social History   Tobacco Use  . Smoking status: Former Smoker    Packs/day: 3.00    Years: 65.00    Pack years: 195.00    Types: Cigarettes    Last attempt to quit: 10/25/1998    Years since quitting: 19.7  . Smokeless tobacco: Never Used  Substance Use Topics  . Alcohol use: No    Alcohol/week: 0.0 standard drinks    Comment: remote history of heavy alcohol use  . Drug use: No     Allergies   Aldactone [spironolactone]   Review of Systems Review of Systems  Constitutional: Positive for chills and fever. Negative for appetite change.  HENT: Positive for rhinorrhea. Negative for ear pain, sneezing and sore throat.   Eyes: Negative for photophobia and visual disturbance.  Respiratory: Positive for cough. Negative for chest tightness, shortness of breath and wheezing.   Cardiovascular: Negative for chest pain and palpitations.  Gastrointestinal: Negative for abdominal pain, blood in stool, constipation, diarrhea, nausea and vomiting.  Genitourinary: Negative for dysuria, hematuria and urgency.  Musculoskeletal: Positive for back pain. Negative for myalgias.  Skin: Negative for rash.  Neurological: Negative for dizziness, weakness and light-headedness.     Physical Exam Updated Vital Signs BP (!) 118/57   Pulse 70   Temp (!) 100.9 F (38.3 C) (Rectal)   Resp 16   Ht 5\' 7"  (1.702 m)   Wt 93 kg   SpO2 95%   BMI 32.11 kg/m   Physical Exam  Constitutional: He is oriented to person, place, and time. He appears well-developed and well-nourished. No distress.  HENT:  Head: Normocephalic and atraumatic.  Nose: Nose normal.  Eyes: Pupils are equal, round, and reactive to light. Conjunctivae and EOM  are normal. Right eye exhibits no discharge. Left eye exhibits no discharge. No scleral icterus.  Neck: Normal range of motion. Neck supple.  Cardiovascular: Normal rate, regular rhythm, normal heart sounds and intact distal pulses. Exam reveals no gallop and no friction rub.  No murmur  heard. Pulmonary/Chest: Effort normal and breath sounds normal. No respiratory distress.  Coarse breath sounds noted in bilateral lower lung fields.  Abdominal: Soft. Bowel sounds are normal. He exhibits no distension. There is no tenderness. There is no guarding.  Musculoskeletal: Normal range of motion. He exhibits no edema.  No lower extremity edema, erythema or calf tenderness bilaterally.  Neurological: He is alert and oriented to person, place, and time. He exhibits normal muscle tone. Coordination normal.  Alert to self, year, situation, family members.  Pupils reactive. No facial asymmetry noted. Cranial nerves appear grossly intact. Sensation intact to light touch on face, BUE and BLE. Strength 5/5 in BUE and BLE.   Skin: Skin is warm and dry. No rash noted.  Psychiatric: He has a normal mood and affect.  Nursing note and vitals reviewed.    ED Treatments / Results  Labs (all labs ordered are listed, but only abnormal results are displayed) Labs Reviewed  COMPREHENSIVE METABOLIC PANEL - Abnormal; Notable for the following components:      Result Value   Glucose, Bld 156 (*)    Creatinine, Ser 1.25 (*)    Calcium 8.6 (*)    Total Protein 6.0 (*)    Albumin 2.9 (*)    GFR calc non Af Amer 52 (*)    All other components within normal limits  CBC WITH DIFFERENTIAL/PLATELET - Abnormal; Notable for the following components:   WBC 17.1 (*)    Neutro Abs 15.4 (*)    All other components within normal limits  PROTIME-INR - Abnormal; Notable for the following components:   Prothrombin Time 39.4 (*)    INR 4.09 (*)    All other components within normal limits  CULTURE, BLOOD (ROUTINE X 2)    CULTURE, BLOOD (ROUTINE X 2)  URINALYSIS, ROUTINE W REFLEX MICROSCOPIC  I-STAT CG4 LACTIC ACID, ED  I-STAT CG4 LACTIC ACID, ED    EKG EKG Interpretation  Date/Time:  Thursday July 13 2018 12:20:13 EDT Ventricular Rate:  70 PR Interval:    QRS Duration: 157 QT Interval:  508 QTC Calculation: 549 R Axis:   -97 Text Interpretation:  Accelerated junctional rhythm RBBB and LAFB Inferior infarct, old similar to previous Confirmed by Theotis Burrow [phone]) on 07/13/2018 12:23:18 PM   Radiology Dg Chest Port 1 View  Result Date: 07/13/2018 CLINICAL DATA:  Three year history of productive cough. History of COPD-emphysema. Former smoker. History of coronary artery disease. EXAM: PORTABLE CHEST 1 VIEW COMPARISON:  Chest x-ray of Mar 02, 2018 FINDINGS: There is chronic volume loss on the right. There is new increased interstitial density in the mid and lower right lung. The left lung is mildly hyperinflated and clear. The cardiac silhouette is enlarged. The pulmonary vascularity is mildly prominent though stable. The ICD is in stable position. There is calcification in the wall of the aortic arch. The visualized sternal wires are intact. IMPRESSION: COPD. Chronic changes at the right lung base which appears stable. New interstitial infiltrate in the right lung worrisome for pneumonia. Followup PA and lateral chest X-ray is recommended in 3-4 weeks following trial of antibiotic therapy to ensure resolution and exclude underlying malignancy. Mild enlargement of the cardiac silhouette and pulmonary vascular prominence, stable. The ICD is in stable position. Previous CABG. Thoracic aortic atherosclerosis. Electronically Signed   By: David  Martinique M.D.   On: 07/13/2018 12:58    Procedures Procedures (including critical care time)  CRITICAL CARE Performed by: Delia Heady   Total critical care  time: 45 minutes  Critical care time was exclusive of separately billable procedures and treating other  patients.  Critical care was necessary to treat or prevent imminent or life-threatening deterioration.  Critical care was time spent personally by me on the following activities: development of treatment plan with patient and/or surrogate as well as nursing, discussions with consultants, evaluation of patient's response to treatment, examination of patient, obtaining history from patient or surrogate, ordering and performing treatments and interventions, ordering and review of laboratory studies, ordering and review of radiographic studies, pulse oximetry and re-evaluation of patient's condition.   Medications Ordered in ED Medications  vancomycin (VANCOCIN) IVPB 1000 mg/200 mL premix (1,000 mg Intravenous New Bag/Given 07/13/18 1343)  ceFEPIme (MAXIPIME) 2 g in sodium chloride 0.9 % 100 mL IVPB (has no administration in time range)  vancomycin (VANCOCIN) IVPB 750 mg/150 ml premix (has no administration in time range)  ceFEPIme (MAXIPIME) 2 g in sodium chloride 0.9 % 100 mL IVPB (0 g Intravenous Stopped 07/13/18 1323)  sodium chloride 0.9 % bolus 500 mL (0 mLs Intravenous Stopped 07/13/18 1342)  acetaminophen (TYLENOL) tablet 650 mg (650 mg Oral Given 07/13/18 1343)     Initial Impression / Assessment and Plan / ED Course  I have reviewed the triage vital signs and the nursing notes.  Pertinent labs & imaging results that were available during my care of the patient were reviewed by me and considered in my medical decision making (see chart for details).  Clinical Course as of Jul 13 1433  Thu Jul 13, 2018  1335 Patient reports improvement in his symptoms with fluids and antibiotics given.  Rectal temp is 100.9.  Leukocytosis at 17.  Patient began on cefepime and vancomycin.  Chest x-ray shows right-sided pneumonia which appears to be the cause of his sepsis and hyperglycemia.  We will continue to monitor.   [HK]    Clinical Course User Index [HK] Delia Heady, PA-C    84 year old male  with past medical history of COPD, type 2 diabetes, hypertension, hyperlipidemia, CAD, atrial fibrillation, pacemaker in place, presents to ED for 1 day history of low blood pressure and high blood sugars.  Patient woke up feeling like he had the chills this morning.  Wife has been checking blood pressures at home with readings in the 92-42 systolic.  Blood sugars have been in the 190s which family states is high for him.  He denies any chest pain, headache, abdominal pain, vomiting.  He complains of a cough productive with green mucus for about 1 week.  Patient oral temperature here is 99.3.  Rectal temp shows febrile at 100.9.  Patient initially hypotensive to 100s over 50s.  Coarse breath sounds noted in bilateral lower lung fields.  Initial lactate is 1.4.  CBC shows leukocytosis at 17.  INR supratherapeutic at 4.  Chest x-ray shows right-sided infiltrate along with COPD.  Patient given 500 cc bolus with some improvement in his blood pressure to 110s.  Oxygen saturations ranging between low 90s to upper 80s.  Patient required oxygen after EMS evaluation.  No abdominal tenderness palpation.  Blood cultures pending.  Patient will need to be admitted for pneumonia and sepsis.  Hospitalist to admit. Patient discussed with and seen by my attending, Dr. Rex Kras.  Portions of this note were generated with Lobbyist. Dictation errors may occur despite best attempts at proofreading.   Final Clinical Impressions(s) / ED Diagnoses   Final diagnoses:  Community acquired pneumonia of right lung, unspecified  part of lung  Sepsis, due to unspecified organism [facility])    ED Discharge Orders    None        Delia Heady, PA-C 07/13/18 1439    Little, Wenda Overland, MD 07/14/18 1049

## 2018-07-13 NOTE — ED Notes (Signed)
Sandwich and coffee given to pt.

## 2018-07-13 NOTE — Progress Notes (Signed)
Pt declined use of hospital CPAP for the night- Pt is going to bring machine from home.  Encouraged pt to call if he changes his mind and would like to use hospital equipment.  Will cont to monitor progress.

## 2018-07-13 NOTE — Progress Notes (Signed)
Pharmacy Antibiotic Note  [name] is a 84 y.o. male admitted on 07/13/2018 with Sepsis  Pharmacy has been consulted for Vancomycin and cefepime dosing.  Plan: Cefepime 2gm IV q24h Vancomycin 1000mg  load and 750mg  IV q12h Monitor for C&S, source identification, LOT, and clinical course  Height: 5\' 7"  (170.2 cm) Weight: 205 lb 0.4 oz (93 kg) IBW/kg (Calculated) : 66.1  Temp (24hrs), Avg:100.1 F (37.8 C), Min:99.2 F (37.3 C), Max:100.9 F (38.3 C)  Recent Labs  Lab 07/13/18 1233 07/13/18 1242  WBC 17.1*  --   CREATININE 1.25*  --   LATICACIDVEN  --  1.46    Estimated Creatinine Clearance: 49.6 mL/min (A) (by C-G formula based on SCr of 1.25 mg/dL (H)).    Allergies  Allergen Reactions  . Aldactone [Spironolactone] Other (See Comments)    Hyperkalemia  reported by Dr. [name] - pt is currently taking 12.5 mg daily -November 2017 per medication list by same MD    Antimicrobials this admission: Vancomycin 9/19 >>  Cefepime 9/19 >>   Dose adjustments this admission:   [facility] A. Levada Dy, PharmD, [place] Pager: [phone] Please utilize Amion for appropriate phone number to reach the unit pharmacist ([place])   07/13/2018 2:07 PM

## 2018-07-13 NOTE — Telephone Encounter (Signed)
New message  Pt c/o BP issue: STAT if pt c/o blurred vision, one-sided weakness or slurred speech  1. What are your last 5 BP readings? 69/43 bp  77/55 bp 102/52 bp  86/48 bp   All readings on 07/13/2018  2. Are you having any other symptoms (ex. Dizziness, headache, blurred vision, passed out)? Cold, blood sugar was high   3. What is your BP issue?Blood pressure is dropping and his blood sugar is going up

## 2018-07-14 ENCOUNTER — Inpatient Hospital Stay (HOSPITAL_COMMUNITY): Payer: PPO

## 2018-07-14 DIAGNOSIS — A419 Sepsis, unspecified organism: Principal | ICD-10-CM

## 2018-07-14 DIAGNOSIS — J189 Pneumonia, unspecified organism: Secondary | ICD-10-CM

## 2018-07-14 LAB — BASIC METABOLIC PANEL
Anion gap: 7 (ref 5–15)
BUN: 20 mg/dL (ref 8–23)
CO2: 25 mmol/L (ref 22–32)
Calcium: 8 mg/dL — ABNORMAL LOW (ref 8.9–10.3)
Chloride: 105 mmol/L (ref 98–111)
Creatinine, Ser: 1.16 mg/dL (ref 0.61–1.24)
GFR calc Af Amer: >60 mL/min (ref >60)
GFR calc non Af Amer: 57 mL/min — ABNORMAL LOW (ref >60)
Glucose, Bld: 120 mg/dL — ABNORMAL HIGH (ref 70–99)
Potassium: 3.5 mmol/L (ref 3.5–5.1)
Sodium: 137 mmol/L (ref 135–145)

## 2018-07-14 LAB — GLUCOSE, CAPILLARY
Glucose-Capillary: 103 mg/dL — ABNORMAL HIGH (ref 70–99)
Glucose-Capillary: 245 mg/dL — ABNORMAL HIGH (ref 70–99)
Glucose-Capillary: 183 mg/dL — ABNORMAL HIGH (ref 70–99)
Glucose-Capillary: 131 mg/dL — ABNORMAL HIGH (ref 70–99)

## 2018-07-14 LAB — LACTIC ACID, PLASMA: Lactic Acid, Venous: 1 mmol/L (ref 0.5–1.9)

## 2018-07-14 LAB — PROTIME-INR
INR: 4.45
Prothrombin Time: 42 seconds — ABNORMAL HIGH (ref 11.4–15.2)

## 2018-07-14 LAB — CBC
HCT: 36.3 % — ABNORMAL LOW (ref 39.0–52.0)
Hemoglobin: 12 g/dL — ABNORMAL LOW (ref 13.0–17.0)
MCH: 31.2 pg (ref 26.0–34.0)
MCHC: 33.1 g/dL (ref 30.0–36.0)
MCV: 94.3 fL (ref 78.0–100.0)
Platelets: 132 10*3/uL — ABNORMAL LOW (ref 150–400)
RBC: 3.85 MIL/uL — ABNORMAL LOW (ref 4.22–5.81)
RDW: 14.6 % (ref 11.5–15.5)
WBC: 13.8 10*3/uL — ABNORMAL HIGH (ref 4.0–10.5)

## 2018-07-14 LAB — SEDIMENTATION RATE: Sed Rate: 33 mm/hr — ABNORMAL HIGH (ref 0–16)

## 2018-07-14 LAB — BRAIN NATRIURETIC PEPTIDE: B Natriuretic Peptide: 651.2 pg/mL — ABNORMAL HIGH (ref 0.0–100.0)

## 2018-07-14 MED ORDER — AZITHROMYCIN 500 MG PO TABS
500.0000 mg | ORAL_TABLET | Freq: Every day | ORAL | Status: DC
  Administered 2018-07-14 – 2018-07-15 (×2): 500 mg via ORAL
  Filled 2018-07-14 (×2): qty 1

## 2018-07-14 MED ORDER — CARVEDILOL 3.125 MG PO TABS
3.1250 mg | ORAL_TABLET | Freq: Two times a day (BID) | ORAL | Status: DC
  Administered 2018-07-14 – 2018-07-15 (×2): 3.125 mg via ORAL
  Filled 2018-07-14 (×2): qty 1

## 2018-07-14 MED ORDER — FUROSEMIDE 10 MG/ML IJ SOLN: 80.0000 mg | Freq: Two times a day (BID) | INTRAMUSCULAR | Status: DC

## 2018-07-14 MED ORDER — GLUCERNA SHAKE PO LIQD
237.0000 mL | Freq: Three times a day (TID) | ORAL | Status: DC
  Administered 2018-07-14 – 2018-07-15 (×4): 237 mL via ORAL

## 2018-07-14 MED ORDER — ADULT MULTIVITAMIN W/MINERALS CH
1.0000 | ORAL_TABLET | Freq: Every day | ORAL | Status: DC
  Administered 2018-07-14 – 2018-07-15 (×2): 1 via ORAL
  Filled 2018-07-14 (×2): qty 1

## 2018-07-14 MED ORDER — FUROSEMIDE 10 MG/ML IJ SOLN
60.0000 mg | Freq: Two times a day (BID) | INTRAMUSCULAR | Status: DC
  Administered 2018-07-14 – 2018-07-15 (×3): 60 mg via INTRAVENOUS
  Filled 2018-07-14 (×3): qty 6

## 2018-07-14 NOTE — Progress Notes (Signed)
PT Cancellation Note  Patient Details Name: [name] MRN: [id number] DOB: Dec 12, 1933   Cancelled Treatment:    Reason Eval/Treat Not Completed: Patient at procedure or test/unavailable. Pt being transported to radiology for MBS. PT to re-attempt eval as time allows.   Lorriane Shire 07/14/2018, 9:13 AM   Lorrin Goodell, PT  Office # [phone] Pager [phone]

## 2018-07-14 NOTE — Progress Notes (Signed)
Pt is on aspiration precautions and elevated HOB per MD orders. Pt educated and refused, stating "I can't sleep like that".

## 2018-07-14 NOTE — Progress Notes (Signed)
Initial Nutrition Assessment  DOCUMENTATION CODES:   Obesity unspecified  INTERVENTION:   - d/c Ensure Enlive  - Glucerna Shake po TID with meals, each supplement provides 220 kcal and 10 grams of protein  - MVI with minerals daily  NUTRITION DIAGNOSIS:   Increased nutrient needs related to acute illness, chronic illness (acute: sepsis, chronic: COPD) as evidenced by estimated needs.  GOAL:   Patient will meet greater than or equal to 90% of their needs  MONITOR:   PO intake, Supplement acceptance, I & O's, Weight trends, Labs  REASON FOR ASSESSMENT:   Malnutrition Screening Tool    ASSESSMENT:   84 year old male who presented to the ED on 9/19 with hypotension and fatigue. PMH significant for COPD, type 2 diabetes mellitus, hypertension, hyperlipidemia, hypothyroidism, GERD, CAD, and atrial fibrillation. Pt admitted for PNA and sepsis.  Spoke with pt at bedside who was eager to get into the recliner to eat breakfast. Pt states, "I'm not hungry, but I'm going to eat." NT entering room at end of visit to assist pt to recliner.  Pt states that "ever since I got out of the hospital, I've had no appetite." Pt states that this occurred approximately 1 year PTA. Pt endorses eating less than usual (smaller portions) over the past 1 year due to decreased appetite. Pt shares that he still eats 3 meals daily but does not snack in between means like he did prior to this timeframe. Pt drinks water and Dr. Malachi Bonds throughout the day.  Breakfast: packet of grits and coffee Lunch: sandwich or leftovers Dinner: "small pizza," or eat out at [employer] or [employer]  Pt endorses weight loss of 70 lbs over the past 1 year. Pt states that his UBW is 262 lbs and that he last weighed this 1 year ago. Pt states he has lost weight "down to 190 lbs." Current weight in chart is 203 lbs. Pt endorses weight fluctuations related to fluid status but has noticed an overall downward trend in his weights of  the past 1 year.  Wt Readings from Last 15 Encounters:  07/13/18 92.1 kg  06/22/18 93.4 kg  06/20/18 88.9 kg  05/08/18 94.8 kg  04/13/18 95.7 kg  02/27/18 95.5 kg  12/30/17 96.2 kg  11/29/17 95.3 kg  11/17/17 95.3 kg  05/12/17 96.3 kg  05/02/17 97.3 kg  04/21/17 97.6 kg  11/16/16 102.5 kg  10/29/16 104.8 kg  09/30/16 105.7 kg   Per weight history in chart, pt has lost 9 lbs over the past 6 months. This is a 4.3% weight loss which is not significant for timeframe.  Medications reviewed and include: Ensure Enlive BID, 325 mg ferrous sulfate daily, 80 mg Lasix BID, sliding scale Novolog, 4 units Novolog TID before meals, 18 units Lantus daily, 100 mcg levothyroxine daily, warfarin, IV antibiotics  Labs reviewed: hemoglobin 12.0 (L), HCT 36.3 (L) CBG's: 103, 104, 191  NUTRITION - FOCUSED PHYSICAL EXAM:    Most Recent Value  Orbital Region  No depletion  Upper Arm Region  Mild depletion  Thoracic and Lumbar Region  No depletion  Buccal Region  No depletion  Temple Region  No depletion  Clavicle Bone Region  Mild depletion  Clavicle and Acromion Bone Region  Mild depletion  Scapular Bone Region  Unable to assess  Dorsal Hand  No depletion  Patellar Region  Mild depletion  Anterior Thigh Region  No depletion  Posterior Calf Region  No depletion  Edema (RD Assessment)  Mild [BLE]  Hair  Reviewed  Eyes  Reviewed  Mouth  Reviewed  Skin  Reviewed  Nails  Reviewed       Diet Order:   Diet Order            Diet heart healthy/carb modified Room service appropriate? Yes; Fluid consistency: Thin  Diet effective now              EDUCATION NEEDS:   No education needs have been identified at this time  Skin:  Skin Assessment: Skin Integrity Issues: Stage I: buttocks  Last BM:  07/12/18  Height:   Ht Readings from Last 1 Encounters:  07/13/18 5\' 6"  (1.676 m)    Weight:   Wt Readings from Last 1 Encounters:  07/13/18 92.1 kg    Ideal Body Weight:  64.55  kg  BMI:  Body mass index is 32.78 kg/m.  Estimated Nutritional Needs:   Kcal:  1700-1900  Protein:  90-105  Fluid:  1.7-1.9 L    Gaynell Face, MS, RD, LDN Inpatient Clinical Dietitian Pager: [phone] Weekend/After Hours: [phone]

## 2018-07-14 NOTE — Evaluation (Signed)
Physical Therapy Evaluation Patient Details Name: [name] MRN: [id number] DOB: [birth year] Today's Date: 07/14/2018   History of Present Illness  84 y.o. male with medical history significant of Ischemic cardiomyopathy EF 35 -40 %, A fib  s/p BiV PM on coumadin, COPD, not on home oxygen; who presented with complaints of chills and low blood pressure, admitted to the hospital with pneumonia and shortness of breath.    Clinical Impression  Pt admitted with above diagnosis. Pt currently with functional limitations due to the deficits listed below (see PT Problem List). On eval, pt required min guard assist transfers and ambulation 200 feet with RW. SpO2 >90% on RA throughout session. Pt will benefit from skilled PT to increase their independence and safety with mobility to allow discharge to the venue listed below.       Follow Up Recommendations Home health PT;Supervision/Assistance - 24 hour    Equipment Recommendations  None recommended by PT    Recommendations for Other Services       Precautions / Restrictions Precautions Precautions: Fall      Mobility  Bed Mobility               General bed mobility comments: Pt received in recliner.  Transfers Overall transfer level: Needs assistance Equipment used: Rolling walker (2 wheeled) Transfers: Sit to/from [facility] Sit to Stand: Min guard Stand pivot transfers: Min guard       General transfer comment: min guard for safety  Ambulation/Gait Ambulation/Gait assistance: Min guard Gait Distance (Feet): 200 Feet Assistive device: Rolling walker (2 wheeled) Gait Pattern/deviations: Step-through pattern;Decreased stride length;Trunk flexed Gait velocity: decreased Gait velocity interpretation: <1.31 ft/sec, indicative of household ambulator General Gait Details: cues for RW management (pt uses rollator at baseline), min guard assist for safety  Stairs            Wheelchair Mobility     Modified Rankin (Stroke Patients Only)       Balance Overall balance assessment: Mild deficits observed, not formally tested                                           Pertinent Vitals/Pain Pain Assessment: No/denies pain    Home Living Family/patient expects to be discharged to:: Private residence Living Arrangements: Spouse/significant other Available Help at Discharge: Family;Available 24 hours/day Type of Home: House Home Access: Stairs to enter Entrance Stairs-Rails: Right Entrance Stairs-Number of Steps: 3 Home Layout: One level Home Equipment: Walker - 4 wheels;Cane - single point;Shower seat - built in;Grab bars - tub/shower      Prior Function Level of Independence: Needs assistance   Gait / Transfers Assistance Needed: rollator for community distances, cane in the house  ADL's / Homemaking Assistance Needed: wife occassionally assists with bathing and dressing        Hand Dominance        Extremity/Trunk Assessment   Upper Extremity Assessment Upper Extremity Assessment: Overall WFL for tasks assessed    Lower Extremity Assessment Lower Extremity Assessment: Overall WFL for tasks assessed    Cervical / Trunk Assessment Cervical / Trunk Assessment: Kyphotic  Communication   Communication: No difficulties  Cognition Arousal/Alertness: Awake/alert Behavior During Therapy: WFL for tasks assessed/performed Overall Cognitive Status: Within Functional Limits for tasks assessed  General Comments      Exercises     Assessment/Plan    PT Assessment Patient needs continued PT services  PT Problem List Decreased mobility;Decreased safety awareness;Decreased knowledge of precautions;Decreased activity tolerance;Decreased balance       PT Treatment Interventions Therapeutic activities;Gait training;Therapeutic exercise;Patient/family education;Balance training;Stair  training;Functional mobility training    PT Goals (Current goals can be found in the Care Plan section)  Acute Rehab PT Goals Patient Stated Goal: home PT Goal Formulation: With patient/family Time For Goal Achievement: 07/28/18 Potential to Achieve Goals: Good    Frequency Min 3X/week   Barriers to discharge        Co-evaluation               AM-PAC PT "6 Clicks" Daily Activity  Outcome Measure Difficulty turning over in bed (including adjusting bedclothes, sheets and blankets)?: None Difficulty moving from lying on back to sitting on the side of the bed? : A Little Difficulty sitting down on and standing up from a chair with arms (e.g., wheelchair, bedside commode, etc,.)?: A Little Help needed moving to and from a bed to chair (including a wheelchair)?: None Help needed walking in hospital room?: A Little Help needed climbing 3-5 steps with a railing? : A Little 6 Click Score: 20    End of Session Equipment Utilized During Treatment: Gait belt Activity Tolerance: Patient tolerated treatment well Patient left: in chair;with call bell/phone within reach;with family/visitor present;with chair alarm set Nurse Communication: Mobility status PT Visit Diagnosis: History of falling (Z91.81);Difficulty in walking, not elsewhere classified (R26.2)    Time: 4196-2229 PT Time Calculation (min) (ACUTE ONLY): 27 min   Charges:   PT Evaluation $PT Eval Low Complexity: 1 Low PT Treatments $Gait Training: 8-22 mins        Lorrin Goodell, PT  Office # [phone] Pager [phone]   Lorriane Shire 07/14/2018, 12:54 PM

## 2018-07-14 NOTE — Progress Notes (Signed)
Modified Barium Swallow Progress Note  Patient Details  Name: [name] MRN: [id number] Date of Birth: 12/26/1933  Today's Date: 07/14/2018  Modified Barium Swallow completed.  Full report located under Chart Review in the Imaging Section.  Brief recommendations include the following:  Clinical Impression  Patient presents with mild oropharyngeal primary respiratory based dysphagia characterized by dis-coordination of respiration and swallowing and question of decreased strength.  Swallow delay triggering at the level of the pyriforms with thin liquids.   One episode of trace aspiration of thin liquids was noted seemingly related to discoordination of swallow and respiration that was immediately sensed and cleared by a reflexive cough.  Across consistencies and textures, note valleculae residue ranging from a mild to moderate amount; Pt often demonstrated a reflexive repeat swallow which assisted in clearing residue. Question mildly decreased BOT strength possibly related to cleft reconstruction and presence of palatal obturator, though BOT ROM appeared adequate.  Recommend continue with regular textures and thin liquids. Recommend no straws, repeat swallows throughout PO and respiratory precautions (handout provided to patient). Also recommend vigilent oral hygiene routine including cleansing oral hardware after all meals and soaking over night to decrease bacteria. ST will continue to follow while in acute setting.   Swallow Evaluation Recommendations       SLP Diet Recommendations: Regular solids;Thin liquid   Liquid Administration via: Cup;No straw   Medication Administration: Whole meds with liquid   Supervision: Patient able to self feed;Intermittent supervision to cue for compensatory strategies   Compensations: Slow rate;Small sips/bites;Follow solids with liquid   Postural Changes: Remain semi-upright after after feeds/meals (Comment);Seated upright at 90 degrees   Oral  Care Recommendations: Oral care BID      Kaitelyn Jamison H. Roddie Mc, CCC-SLP Speech Language Pathologist   Wende Bushy 07/14/2018,2:44 PM

## 2018-07-14 NOTE — Progress Notes (Signed)
ANTICOAGULATION CONSULT NOTE - Follow Up Consult  Pharmacy Consult for Coumadin Indication: atrial fibrillation  Allergies  Allergen Reactions  . Aldactone [Spironolactone] Other (See Comments)    Hyperkalemia  reported by Dr. [name] - pt is currently taking 12.5 mg daily -November 2017 per medication list by same MD    Patient Measurements: Height: 5\' 6"  (167.6 cm) Weight: 203 lb 1.6 oz (92.1 kg) IBW/kg (Calculated) : 63.8  Vital Signs: Temp: 97.8 F (36.6 C) (09/20 0557) Temp Source: Oral (09/20 0557) BP: 126/65 (09/20 0557) Pulse Rate: 70 (09/20 0557)  Labs: Recent Labs    07/13/18 1233 07/14/18 0029  HGB 13.9 12.0*  HCT 42.0 36.3*  PLT 153 132*  LABPROT 39.4* 42.0*  INR 4.09* 4.45*  CREATININE 1.25* 1.16    Estimated Creatinine Clearance: 52.2 mL/min (by C-G formula based on SCr of 1.16 mg/dL).   Assessment:   84 yr old male continues on Coumadin as prior to admission for atrial fibrillation.   INR supratherapeutic (4.09) on admit 9/19 and up to 4.45 today.      Home Coumadin regimen: Coumadin 5 mg daily except 2.5 mg on Wednesdays.    Last dose 9/18.  Goal of Therapy:  INR 2-3 Monitor platelets by anticoagulation protocol: Yes   Plan:   No Coumadin again today.  Daily PT/INR.  Arty Baumgartner, [place] Pager: [phone] or phone: [phone] 07/14/2018,1:18 PM

## 2018-07-14 NOTE — Care Management Note (Signed)
Case Management Note  Patient Details  Name: [name] MRN: [id number] Date of Birth: 11-16-1933  Subjective/Objective:   Sepsis/ PNA. From with wife.             [name] (Spouse)       [phone]          PCP: [name]  Action/Plan: Transition to home when medically stable with home health services to follow. Pt with transportation to home.  Expected Discharge Date:                  Expected Discharge Plan:  [place]  In-House Referral:     Discharge planning Services  CM Consult  Post Acute Care Choice:    Choice offered to:  Patient  DME Arranged:    DME Agency:     HH Arranged:  PT [place]:  [place]  Status of Service:  In process, will continue to follow  If discussed at Long Length of Stay Meetings, dates discussed:    Additional Comments:  Sharin Mons, RN 07/14/2018, 4:30 PM

## 2018-07-14 NOTE — Progress Notes (Signed)
@IPLOG @        PROGRESS NOTE                                                                                                                                                                                                             Patient Demographics:    [name], is a 84 y.o. male, DOB - [birth year], YBO:[id number]  Admit date - 07/13/2018   Admitting Physician Norval Morton, MD  Outpatient Primary MD for the patient is [name], MD  LOS - 1  Chief Complaint  Patient presents with  . Fatigue  . Hypotension       Brief Narrative [name] is a 84 y.o. male with medical history significant of Ischemic cardiomyopathy EF 35 -40 %, A fib  s/p BiV PM on coumadin, COPD, not on home oxygen; who presented with complaints of chills and low blood pressure, admitted to the hospital with pneumonia and shortness of breath.   Subjective:    [name] today has, No headache, No chest pain, No abdominal pain - No Nausea, No new weakness tingling or numbness, does have positive productive cough along with SOB.     Assessment  & Plan :     1.  Productive cough and shortness of breath.  Not entirely convinced that he has pneumonia, his phlegm is clear and frothy, chest x-ray is not very impressive, BNP is pending, procalcitonin is unremarkable.  At this time there is a good possibility that he had acute on chronic systolic heart failure exacerbation with a EF of 40%.  Has AICD in place.  His viral respiratory panel has been negative, he appears nontoxic, will taper down antibiotics to oral, trial of IV Lasix, continue Coreg at lower than home dose as blood pressures are soft, continue Aldactone, monitor intake output, electrolytes, continue supportive care.  Encouraged to increase activity.  Follow cultures.  We will try to add low-dose ACE inhibitor if blood pressure and renal function permit.  2.  Paroxysmal atrial fibrillation.  Mali vas 2 score of at least 5  -including stroke.  Continue Coreg and amiodarone for rate control, Coumadin being monitored by pharmacy.  3.  CAD ischemic cardiomyopathy.  No acute issues.  No chest pain.  Continue supportive care.  Continue beta-blocker and statin for secondary prevention.  4.  History of COPD.  At baseline.  No exacerbation.  5.  Hypothyroidism.  Continue Synthroid.  6.  Dyslipidemia.  On statin.  7.  OSA.  CPAP nightly.  8.  EKG 2.  Creatinine at baseline of around 1.2.  9.  DM type II.  On Lantus and sliding scale.  CBG (last 3)  Recent Labs    07/13/18 1639 07/13/18 2151 07/14/18 0804  GLUCAP 191* 104* 103*   Lab Results  Component Value Date   HGBA1C 7.7 (H) 01/15/2016      Family Communication  :  None  Code Status :  Full  Disposition Plan  :  Inpt  Consults  :  None  Procedures  :      DVT Prophylaxis  : Coumadin  Lab Results  Component Value Date   PLT 132 (L) 07/14/2018    Diet :  Diet Order            Diet heart healthy/carb modified Room service appropriate? Yes; Fluid consistency: Thin  Diet effective now               Inpatient Medications Scheduled Meds: . amiodarone  200 mg Oral Daily  . atorvastatin  80 mg Oral q1800  . carvedilol  6.25 mg Oral BID  . ezetimibe  10 mg Oral Daily  . feeding supplement (GLUCERNA SHAKE)  237 mL Oral TID WC  . ferrous sulfate  325 mg Oral QPM  . fluticasone  2 spray Each Nare Daily  . fluticasone furoate-vilanterol  1 puff Inhalation Daily  . furosemide  80 mg Intravenous BID  . guaiFENesin  600 mg Oral BID  . insulin aspart  0-5 Units Subcutaneous QHS  . insulin aspart  4 Units Subcutaneous TID AC  . insulin glargine  18 Units Subcutaneous Q1200  . levothyroxine  100 mcg Oral QAC breakfast  . multivitamin with minerals  1 tablet Oral Daily  . sodium chloride flush  3 mL Intravenous Q12H  . spironolactone  25 mg Oral Daily  . tiotropium  1 capsule Inhalation Daily  . Warfarin - Pharmacist Dosing  Inpatient   Does not apply q1800   Continuous Infusions: . azithromycin 500 mg (07/14/18 0536)  . cefTRIAXone (ROCEPHIN)  IV     PRN Meds:.acetaminophen **OR** acetaminophen, albuterol, ondansetron **OR** ondansetron (ZOFRAN) IV, senna, traMADol  Antibiotics  :   Anti-infectives (From admission, onward)   Start     Dose/Rate Route Frequency Ordered Stop   07/14/18 1300  ceFEPIme (MAXIPIME) 2 g in sodium chloride 0.9 % 100 mL IVPB  Status:  Discontinued     2 g 200 mL/hr over 30 Minutes Intravenous Every 24 hours 07/13/18 1407 07/13/18 1459   07/14/18 1200  cefTRIAXone (ROCEPHIN) 1 g in sodium chloride 0.9 % 100 mL IVPB     1 g 200 mL/hr over 30 Minutes Intravenous Every 24 hours 07/13/18 1459 07/21/18 1159   07/14/18 0600  azithromycin (ZITHROMAX) 500 mg in sodium chloride 0.9 % 250 mL IVPB     500 mg 250 mL/hr over 60 Minutes Intravenous Every 24 hours 07/13/18 1459 07/21/18 0559   07/14/18 0200  vancomycin (VANCOCIN) IVPB 750 mg/150 ml premix  Status:  Discontinued     750 mg 150 mL/hr over 60 Minutes Intravenous Every 12 hours 07/13/18 1407 07/13/18 1459   07/13/18 1245  ceFEPIme (MAXIPIME) 2 g in sodium chloride 0.9 % 100 mL IVPB     2 g 200 mL/hr over 30 Minutes Intravenous  Once 07/13/18 1236 07/13/18 1323   07/13/18 1245  vancomycin (VANCOCIN) IVPB 1000 mg/200 mL premix     1,000 mg 200 mL/hr over 60 Minutes Intravenous  Once  07/13/18 1236 07/13/18 1449          Objective:   Vitals:   07/14/18 0200 07/14/18 0400 07/14/18 0557 07/14/18 0833  BP: (!) 120/55 136/66 126/65   Pulse:  72 70   Resp: 18 20 17    Temp: 98.5 F (36.9 C) 98 F (36.7 C) 97.8 F (36.6 C)   TempSrc:  Oral Oral   SpO2: 93%  97% 97%  Weight:      Height:        Wt Readings from Last 3 Encounters:  07/13/18 92.1 kg  06/22/18 93.4 kg  06/20/18 88.9 kg     Intake/Output Summary (Last 24 hours) at 07/14/2018 1102 Last data filed at 07/14/2018 0805 Gross per 24 hour  Intake 1111.27 ml   Output 350 ml  Net 761.27 ml     Physical Exam  Awake Alert, Oriented X 3, No new F.N deficits, Normal affect [place].AT,PERRAL Supple Neck,No JVD, No cervical lymphadenopathy appriciated.  Symmetrical Chest wall movement, Good air movement bilaterally, ++ rales RRR,No Gallops,Rubs or new Murmurs, No Parasternal Heave +ve B.Sounds, Abd Soft, No tenderness, No organomegaly appriciated, No rebound - guarding or rigidity. No Cyanosis, Clubbing or edema, No new Rash or bruise     Data Review:    CBC Recent Labs  Lab 07/13/18 1233 07/14/18 0029  WBC 17.1* 13.8*  HGB 13.9 12.0*  HCT 42.0 36.3*  PLT 153 132*  MCV 94.8 94.3  MCH 31.4 31.2  MCHC 33.1 33.1  RDW 14.5 14.6  LYMPHSABS 0.8  --   MONOABS 0.8  --   EOSABS 0.0  --   BASOSABS 0.0  --     Chemistries  Recent Labs  Lab 07/13/18 1233 07/14/18 0029  NA 137 137  K 3.9 3.5  CL 98 105  CO2 28 25  GLUCOSE 156* 120*  BUN 17 20  CREATININE 1.25* 1.16  CALCIUM 8.6* 8.0*  AST 26  --   ALT 15  --   ALKPHOS 68  --   BILITOT 0.9  --    ------------------------------------------------------------------------------------------------------------------ No results for input(s): CHOL, HDL, LDLCALC, TRIG, CHOLHDL, LDLDIRECT in the last 72 hours.  Lab Results  Component Value Date   HGBA1C 7.7 (H) 01/15/2016   ------------------------------------------------------------------------------------------------------------------ Recent Labs    07/13/18 1559  TSH 0.454   ------------------------------------------------------------------------------------------------------------------ No results for input(s): VITAMINB12, FOLATE, FERRITIN, TIBC, IRON, RETICCTPCT in the last 72 hours.  Coagulation profile Recent Labs  Lab 07/13/18 1233 07/14/18 0029  INR 4.09* 4.45*    No results for input(s): DDIMER in the last 72 hours.  Cardiac Enzymes No results for input(s): CKMB, TROPONINI, MYOGLOBIN in the last 168  hours.  Invalid input(s): CK ------------------------------------------------------------------------------------------------------------------    Component Value Date/Time   BNP 552.6 (H) 09/03/2016 2004   BNP 702.2 (H) 06/23/2016 1404    Micro Results Recent Results (from the past 240 hour(s))  Blood Culture (routine x 2)     Status: None (Preliminary result)   Collection Time: 07/13/18 12:25 PM  Result Value Ref Range Status   Specimen Description BLOOD LEFT ANTECUBITAL  Final   Special Requests   Final    BOTTLES DRAWN AEROBIC AND ANAEROBIC Blood Culture adequate volume   Culture   Final    NO GROWTH < 24 HOURS Performed at [facility], [address] [place], [place] [address]    Report Status PENDING  Incomplete  Blood Culture (routine x 2)     Status: None (Preliminary  result)   Collection Time: 07/13/18 12:30 PM  Result Value Ref Range Status   Specimen Description BLOOD RIGHT ANTECUBITAL  Final   Special Requests   Final    BOTTLES DRAWN AEROBIC AND ANAEROBIC Blood Culture adequate volume   Culture   Final    NO GROWTH < 24 HOURS Performed at [facility], [address] [place], [place] [address]    Report Status PENDING  Incomplete  Respiratory Panel by PCR     Status: None   Collection Time: 07/13/18  3:47 PM  Result Value Ref Range Status   Adenovirus NOT DETECTED NOT DETECTED Final   Coronavirus 229E NOT DETECTED NOT DETECTED Final   Coronavirus HKU1 NOT DETECTED NOT DETECTED Final   Coronavirus NL63 NOT DETECTED NOT DETECTED Final   Coronavirus OC43 NOT DETECTED NOT DETECTED Final   Metapneumovirus NOT DETECTED NOT DETECTED Final   Rhinovirus / Enterovirus NOT DETECTED NOT DETECTED Final   Influenza A NOT DETECTED NOT DETECTED Final   Influenza B NOT DETECTED NOT DETECTED Final   Parainfluenza Virus 1 NOT DETECTED NOT DETECTED Final   Parainfluenza Virus 2 NOT DETECTED NOT DETECTED Final   Parainfluenza Virus 3 NOT DETECTED NOT DETECTED  Final   Parainfluenza Virus 4 NOT DETECTED NOT DETECTED Final   Respiratory Syncytial Virus NOT DETECTED NOT DETECTED Final   Bordetella pertussis NOT DETECTED NOT DETECTED Final   Chlamydophila pneumoniae NOT DETECTED NOT DETECTED Final   Mycoplasma pneumoniae NOT DETECTED NOT DETECTED Final    Comment: Performed at [facility], [place] [address], [place], [place] [address]    Radiology Reports Dg Chest 2 View  Result Date: 07/14/2018 CLINICAL DATA:  Shortness of breath EXAM: CHEST - 2 VIEW COMPARISON:  07/13/2018 FINDINGS: Left AICD remains in place, unchanged. Prior CABG. Cardiomegaly. Diffuse interstitial opacities throughout the lungs, right greater than left with small bilateral effusions. No real change since prior study. IMPRESSION: Interstitial prominence within the lungs, right greater than left with small effusions. Findings could reflect asymmetric edema or infection. No real change. Electronically Signed   By: Rolm Baptise M.D.   On: 07/14/2018 08:47   Ct Head Wo Contrast  Result Date: 06/22/2018 CLINICAL DATA:  Recent fall with headaches EXAM: CT HEAD WITHOUT CONTRAST TECHNIQUE: Contiguous axial images were obtained from the base of the skull through the vertex without intravenous contrast. COMPARISON:  04/01/2017 FINDINGS: Brain: Diffuse atrophic changes and chronic white matter ischemic changes are seen. No findings to suggest acute hemorrhage, acute infarction or space-occupying mass lesion are noted. Vascular: No hyperdense vessel or unexpected calcification. Skull: Normal. Negative for fracture or focal lesion. Sinuses/Orbits: No acute finding. Other: Right scalp laceration is noted in the forehead consistent with the recent injury. IMPRESSION: Chronic atrophic and ischemic changes without acute abnormality. Right scalp laceration. Electronically Signed   By: [name] M.D.   On: 06/22/2018 12:23   Dg Chest Port 1 View  Result Date: 07/13/2018 CLINICAL DATA:  Three year  history of productive cough. History of COPD-emphysema. Former smoker. History of coronary artery disease. EXAM: PORTABLE CHEST 1 VIEW COMPARISON:  Chest x-ray of Mar 02, 2018 FINDINGS: There is chronic volume loss on the right. There is new increased interstitial density in the mid and lower right lung. The left lung is mildly hyperinflated and clear. The cardiac silhouette is enlarged. The pulmonary vascularity is mildly prominent though stable. The ICD is in stable position. There is calcification in the wall of  the aortic arch. The visualized sternal wires are intact. IMPRESSION: COPD. Chronic changes at the right lung base which appears stable. New interstitial infiltrate in the right lung worrisome for pneumonia. Followup PA and lateral chest X-ray is recommended in 3-4 weeks following trial of antibiotic therapy to ensure resolution and exclude underlying malignancy. Mild enlargement of the cardiac silhouette and pulmonary vascular prominence, stable. The ICD is in stable position. Previous CABG. Thoracic aortic atherosclerosis. Electronically Signed   By: David  Martinique M.D.   On: 07/13/2018 12:58   Xr C-arm No Report  Result Date: 06/23/2018 Please see Notes tab for imaging impression.   Time Spent in minutes  30   Lala Lund M.D on 07/14/2018 at 11:02 AM  To page go to [email or web address] - password [facility]

## 2018-07-14 NOTE — Discharge Instructions (Addendum)
Follow with Primary MD [name], MD in 3 days   Get CBC, CMP, INR, Magnesium, 2 view Chest X ray checked  by Primary MD 3 days.  Activity: As tolerated with Full fall precautions use walker/cane & assistance as needed  Disposition Home    Diet: Heart Healthy Low Carb with strict 1.5 L/day total fluid restriction     For Heart failure patients - Check your Weight same time everyday, if you gain over 2 pounds, or you develop in leg swelling, experience more shortness of breath or chest pain, call your Primary MD immediately. Follow Cardiac Low Salt Diet and 1.5 lit/day fluid restriction.  Special Instructions: If you have smoked or chewed Tobacco  in the last 2 yrs please stop smoking, stop any regular Alcohol  and or any Recreational drug use.  On your next visit with your primary care physician please Get Medicines reviewed and adjusted.  Please request your Prim.MD to go over all Hospital Tests and Procedure/Radiological results at the follow up, please get all Hospital records sent to your Prim MD by signing hospital release before you go home.  If you experience worsening of your admission symptoms, develop shortness of breath, life threatening emergency, suicidal or homicidal thoughts you must seek medical attention immediately by calling 911 or calling your MD immediately  if symptoms less severe.  You Must read complete instructions/literature along with all the possible adverse reactions/side effects for all the Medicines you take and that have been prescribed to you. Take any new Medicines after you have completely understood and accpet all the possible adverse reactions/side effects.      Information on my medicine - Coumadin   (Warfarin)  Why was Coumadin prescribed for you? Coumadin was prescribed for you because you have a blood clot or a medical condition that can cause an increased risk of forming blood clots. Blood clots can cause serious health problems by blocking  the flow of blood to the heart, lung, or brain. Coumadin can prevent harmful blood clots from forming. As a reminder your indication for Coumadin is:   Stroke Prevention Because Of Atrial Fibrillation  What test will check on my response to Coumadin? While on Coumadin (warfarin) you will need to have an INR test regularly to ensure that your dose is keeping you in the desired range. The INR (international normalized ratio) number is calculated from the result of the laboratory test called prothrombin time (PT).  If an INR APPOINTMENT HAS NOT ALREADY BEEN MADE FOR YOU please schedule an appointment to have this lab work done by your health care provider within 7 days. Your INR goal is usually a number between:  2 to 3 or your provider may give you a more narrow range like 2-2.5.  Ask your health care provider during an office visit what your goal INR is.  What  do you need to  know  About  COUMADIN? Take Coumadin (warfarin) exactly as prescribed by your healthcare provider about the same time each day.  DO NOT stop taking without talking to the doctor who prescribed the medication.  Stopping without other blood clot prevention medication to take the place of Coumadin may increase your risk of developing a new clot or stroke.  Get refills before you run out.  What do you do if you miss a dose? If you miss a dose, take it as soon as you remember on the same day then continue your regularly scheduled regimen the next day.  Do not take two doses of Coumadin at the same time.  Important Safety Information A possible side effect of Coumadin (Warfarin) is an increased risk of bleeding. You should call your healthcare provider right away if you experience any of the following: ? Bleeding from an injury or your nose that does not stop. ? Unusual colored urine (red or dark brown) or unusual colored stools (red or black). ? Unusual bruising for unknown reasons. ? A serious fall or if you hit your head (even  if there is no bleeding).  Some foods or medicines interact with Coumadin (warfarin) and might alter your response to warfarin. To help avoid this: ? Eat a balanced diet, maintaining a consistent amount of Vitamin K. ? Notify your provider about major diet changes you plan to make. ? Avoid alcohol or limit your intake to 1 drink for women and 2 drinks for men per day. (1 drink is 5 oz. wine, 12 oz. beer, or 1.5 oz. liquor.)  Make sure that ANY health care provider who prescribes medication for you knows that you are taking Coumadin (warfarin).  Also make sure the healthcare provider who is monitoring your Coumadin knows when you have started a new medication including herbals and non-prescription products.  Coumadin (Warfarin)  Major Drug Interactions  Increased Warfarin Effect Decreased Warfarin Effect  Alcohol (large quantities) Antibiotics (esp. Septra/Bactrim, Flagyl, Cipro) Amiodarone (Cordarone) Aspirin (ASA) Cimetidine (Tagamet) Megestrol (Megace) NSAIDs (ibuprofen, naproxen, etc.) Piroxicam (Feldene) Propafenone (Rythmol SR) Propranolol (Inderal) Isoniazid (INH) Posaconazole (Noxafil) Barbiturates (Phenobarbital) Carbamazepine (Tegretol) Chlordiazepoxide (Librium) Cholestyramine (Questran) Griseofulvin Oral Contraceptives Rifampin Sucralfate (Carafate) Vitamin K   Coumadin (Warfarin) Major Herbal Interactions  Increased Warfarin Effect Decreased Warfarin Effect  Garlic Ginseng Ginkgo biloba Coenzyme Q10 Green tea St. Johns wort    Coumadin (Warfarin) FOOD Interactions  Eat a consistent number of servings per week of foods HIGH in Vitamin K (1 serving =  cup)  Collards (cooked, or boiled & drained) Kale (cooked, or boiled & drained) Mustard greens (cooked, or boiled & drained) Parsley *serving size only =  cup Spinach (cooked, or boiled & drained) Swiss chard (cooked, or boiled & drained) Turnip greens (cooked, or boiled & drained)  Eat a consistent  number of servings per week of foods MEDIUM-HIGH in Vitamin K (1 serving = 1 cup)  Asparagus (cooked, or boiled & drained) Broccoli (cooked, boiled & drained, or raw & chopped) Brussel sprouts (cooked, or boiled & drained) *serving size only =  cup Lettuce, raw (green leaf, endive, romaine) Spinach, raw Turnip greens, raw & chopped   These websites have more information on Coumadin (warfarin):  Fail[employer].se; VeganReport.com.au;

## 2018-07-15 LAB — GLUCOSE, CAPILLARY
Glucose-Capillary: 110 mg/dL — ABNORMAL HIGH (ref 70–99)
Glucose-Capillary: 240 mg/dL — ABNORMAL HIGH (ref 70–99)

## 2018-07-15 LAB — BASIC METABOLIC PANEL
Anion gap: 10 (ref 5–15)
BUN: 19 mg/dL (ref 8–23)
CO2: 28 mmol/L (ref 22–32)
Calcium: 8.5 mg/dL — ABNORMAL LOW (ref 8.9–10.3)
Chloride: 99 mmol/L (ref 98–111)
Creatinine, Ser: 1.04 mg/dL (ref 0.61–1.24)
GFR calc Af Amer: >60 mL/min (ref >60)
GFR calc non Af Amer: >60 mL/min (ref >60)
Glucose, Bld: 126 mg/dL — ABNORMAL HIGH (ref 70–99)
Potassium: 3.1 mmol/L — ABNORMAL LOW (ref 3.5–5.1)
Sodium: 137 mmol/L (ref 135–145)

## 2018-07-15 LAB — CBC
HCT: 38.9 % — ABNORMAL LOW (ref 39.0–52.0)
Hemoglobin: 13 g/dL (ref 13.0–17.0)
MCH: 30.8 pg (ref 26.0–34.0)
MCHC: 33.4 g/dL (ref 30.0–36.0)
MCV: 92.2 fL (ref 78.0–100.0)
Platelets: 161 10*3/uL (ref 150–400)
RBC: 4.22 MIL/uL (ref 4.22–5.81)
RDW: 14.3 % (ref 11.5–15.5)
WBC: 10 10*3/uL (ref 4.0–10.5)

## 2018-07-15 LAB — MAGNESIUM: Magnesium: 1.7 mg/dL (ref 1.7–2.4)

## 2018-07-15 LAB — EXPECTORATED SPUTUM ASSESSMENT W GRAM STAIN, RFLX TO RESP C

## 2018-07-15 LAB — PROTIME-INR
INR: 2.17
Prothrombin Time: 24 seconds — ABNORMAL HIGH (ref 11.4–15.2)

## 2018-07-15 LAB — LEGIONELLA PNEUMOPHILA SEROGP 1 UR AG: L. pneumophila Serogp 1 Ur Ag: NEGATIVE

## 2018-07-15 MED ORDER — SPIRONOLACTONE 25 MG PO TABS: 25.0000 mg | ORAL_TABLET | Freq: Every day | ORAL | Status: DC

## 2018-07-15 MED ORDER — POTASSIUM CHLORIDE ER 20 MEQ PO TBCR: 20.0000 meq | EXTENDED_RELEASE_TABLET | Freq: Every day | ORAL | 0 refills | Status: AC

## 2018-07-15 MED ORDER — POTASSIUM CHLORIDE 10 MEQ/100ML IV SOLN
10.0000 meq | INTRAVENOUS | Status: AC
  Administered 2018-07-15 (×2): 10 meq via INTRAVENOUS
  Filled 2018-07-15 (×3): qty 100

## 2018-07-15 MED ORDER — AZITHROMYCIN 500 MG PO TABS: 500.0000 mg | ORAL_TABLET | Freq: Every day | ORAL | 0 refills | Status: DC

## 2018-07-15 MED ORDER — POTASSIUM CHLORIDE CRYS ER 20 MEQ PO TBCR
40.0000 meq | EXTENDED_RELEASE_TABLET | Freq: Once | ORAL | Status: AC
  Administered 2018-07-15: 40 meq via ORAL
  Filled 2018-07-15: qty 2

## 2018-07-15 MED ORDER — FUROSEMIDE 40 MG PO TABS: 120.0000 mg | ORAL_TABLET | Freq: Two times a day (BID) | ORAL | 0 refills | Status: DC

## 2018-07-15 NOTE — Discharge Summary (Signed)
[name]:[id number] DOB: 03-16-1934 DOA: 07/13/2018  PCP: [name], MD  Admit date: 07/13/2018  Discharge date: 07/15/2018  Admitted From: Home   Disposition:  Home   Recommendations for Outpatient Follow-up:   Follow up with PCP in 1-2 weeks  PCP Please obtain BMP/CBC, 2 view CXR in 1week,  (see Discharge instructions)   PCP Please follow up on the following pending results:    Home Health: PT,RN   Equipment/Devices: None  Consultations: None Discharge Condition: Stable   CODE STATUS: Full   Diet Recommendation: Heart Healthy low carbohydrate with strict 1.5 L total fluid restriction per day.    Chief Complaint  Patient presents with  . Fatigue  . Hypotension     Brief history of present illness from the day of admission and additional interim summary    [name] L Cauthrenis a 84 y.o.malewith medical history significant of Ischemic cardiomyopathy EF 35 -40 %, A fib s/p BiV PM on coumadin, COPD, not on home oxygen; who presented with complaints of chills and low blood pressure, admitted to the hospital with pneumonia and shortness of breath.                                                                 Hospital Course    1. Clear but Productive cough and shortness of breath with some orthopnea.    Initially thought to be pneumonia but clinically more likely to be acute on chronic systolic CHF with an EF of 40%, he had an elevated proBNP, procalcitonin was unremarkable.    He was treated with high-dose IV Lasix along with home dose Coreg and Aldactone, his antibiotics were tapered down to oral azithromycin, so far all cultures have been negative and with diuresis he feels remarkably better and now off of oxygen at baseline, I think he had a CHF exacerbation rather than infectious  etiology, at this time he is symptom-free on room air will be discharged home on higher than home dose Lasix along with home dose Aldactone and Coreg, have added low-dose potassium supplementation as well, 3 more days of oral azithromycin, requested to follow with PCP within 4 to 5 days for repeat weight, BMP and a magnesium check, he already has an AICD in place.  Also requested to follow with his primary cardiologist within a week of discharge.  If renal function and blood pressure appear stable a low-dose ACE inhibitor can be added after he has been adequately diuresed in the outpatient setting.   2.  Paroxysmal atrial fibrillation.  Mali vas 2 score of at least 5 -including stroke.  Continue Coreg and amiodarone for rate control, can you home dose Coumadin and follow with PCP for INR monitoring.  3.  CAD ischemic cardiomyopathy.  No acute issues.  No chest pain.  Continue supportive care.  Continue beta-blocker and statin for secondary prevention.  4.  History of COPD.  At baseline.  No exacerbation.  5.  Hypothyroidism.  Continue Synthroid.  6.  Dyslipidemia.  On statin.  7.  OSA.  CPAP nightly.  8.  EKG 2.  Creatinine at baseline of around 1.2.  9.  DM type II.    To new home regimen upon discharge.  CBG (last 3)  Recent Labs    07/14/18 1630 07/14/18 2155 07/15/18 0747  GLUCAP 183* 131* 110*     Discharge diagnosis     Principal Problem:   Sepsis due to pneumonia [facility]) Active Problems:   Essential hypertension   Automatic implantable cardioverter-defibrillator in situ   Ischemic dilated cardiomyopathy (HCC)   PAF (paroxysmal atrial fibrillation) (HCC)   OSA (obstructive sleep apnea)   COPD (chronic obstructive pulmonary disease) ([place])   Diabetes mellitus with diabetic neuropathy, with long-term current use of insulin (HCC)   Supratherapeutic INR   Hypoxia   Hypothyroidism   Pressure injury of skin    Discharge instructions    Discharge Instructions      Discharge instructions   Complete by:  As directed    Follow with Primary MD [name], MD in 3 days   Get CBC, CMP, INR, Magnesium, 2 view Chest X ray checked  by Primary MD 3 days.  Activity: As tolerated with Full fall precautions use walker/cane & assistance as needed  Disposition Home    Diet: Heart Healthy Low Carb with strict 1.5 L/day total fluid restriction     For Heart failure patients - Check your Weight same time everyday, if you gain over 2 pounds, or you develop in leg swelling, experience more shortness of breath or chest pain, call your Primary MD immediately. Follow Cardiac Low Salt Diet and 1.5 lit/day fluid restriction.  Special Instructions: If you have smoked or chewed Tobacco  in the last 2 yrs please stop smoking, stop any regular Alcohol  and or any Recreational drug use.  On your next visit with your primary care physician please Get Medicines reviewed and adjusted.  Please request your Prim.MD to go over all Hospital Tests and Procedure/Radiological results at the follow up, please get all Hospital records sent to your Prim MD by signing hospital release before you go home.  If you experience worsening of your admission symptoms, develop shortness of breath, life threatening emergency, suicidal or homicidal thoughts you must seek medical attention immediately by calling 911 or calling your MD immediately  if symptoms less severe.  You Must read complete instructions/literature along with all the possible adverse reactions/side effects for all the Medicines you take and that have been prescribed to you. Take any new Medicines after you have completely understood and accpet all the possible adverse reactions/side effects.   Increase activity slowly   Complete by:  As directed       Discharge Medications   Allergies as of 07/15/2018      Reactions   Aldactone [spironolactone] Other (See Comments)   Hyperkalemia  reported by Dr. [name] - pt is  currently taking 12.5 mg daily -November 2017 per medication list by same MD      Medication List    TAKE these medications   albuterol 108 (90 Base) MCG/ACT inhaler Commonly known as:  PROVENTIL HFA;VENTOLIN HFA Inhale 2 puffs into the lungs every 6 (six) hours as needed for wheezing or shortness of breath. Must keep June 2016 appt.  albuterol (2.5 MG/3ML) 0.083% nebulizer solution Commonly known as:  PROVENTIL USE 1 VIAL VIA NEBULIZER 3 TIMES A DAY AS DIRECTED. DX:J44.9   amiodarone 400 MG tablet Commonly known as:  PACERONE TAKE HALF TABLET (200 MG TOTAL) BY MOUTH DAILY.   atorvastatin 80 MG tablet Commonly known as:  LIPITOR TAKE 1 TABLET (80 MG TOTAL) BY MOUTH DAILY.   azithromycin 500 MG tablet Commonly known as:  ZITHROMAX Take 1 tablet (500 mg total) by mouth daily.   bisacodyl 5 MG EC tablet Commonly known as:  DULCOLAX Take 2 tablets (10 mg total) by mouth daily at 12 noon.   carvedilol 6.25 MG tablet Commonly known as:  COREG TAKE 1 TABLET BY MOUTH TWICE A DAY   cholecalciferol 1000 units tablet Commonly known as:  VITAMIN D Take 2,000 Units by mouth every evening.   dextromethorphan-guaiFENesin 30-600 MG 12hr tablet Commonly known as:  MUCINEX DM Take 1 tablet by mouth 2 (two) times daily as needed for cough.   ferrous sulfate 325 (65 FE) MG tablet Take 325 mg by mouth every evening.   fluticasone 50 MCG/ACT nasal spray Commonly known as:  FLONASE SPRAY 2 SPRAYS INTO EACH NOSTRIL EVERY DAY   furosemide 40 MG tablet Commonly known as:  LASIX Take 3 tablets (120 mg total) by mouth 2 (two) times daily. What changed:  how much to take   insulin degludec 100 UNIT/ML Sopn FlexTouch Pen Commonly known as:  TRESIBA Inject 0.3 mLs (30 Units total) into the skin daily before lunch. What changed:  how much to take   levothyroxine 100 MCG tablet Commonly known as:  SYNTHROID, LEVOTHROID Take 100 mcg by mouth daily before breakfast.   NOVOLOG FLEXPEN 100  UNIT/ML FlexPen Generic drug:  insulin aspart Inject 4 Units into the skin 3 (three) times daily before meals.   Potassium Chloride ER 20 MEQ Tbcr Take 20 mEq by mouth daily.   PRESCRIPTION MEDICATION Inhale into the lungs at bedtime. CPAP   PRESERVISION AREDS PO Take 1 tablet by mouth 2 (two) times daily.   SENOKOT 8.6 MG tablet Generic drug:  senna Take 2 tablets by mouth at bedtime.   SPIRIVA RESPIMAT 2.5 MCG/ACT Aers Generic drug:  Tiotropium Bromide Monohydrate INHALE 2 PUFFS INTO THE LUNGS DAILY.   spironolactone 25 MG tablet Commonly known as:  ALDACTONE TAKE 1/2 TABLET (12.5 MG TOTAL) BY MOUTH DAILY.   traMADol 50 MG tablet Commonly known as:  ULTRAM Take 50 mg by mouth 3 (three) times daily as needed for moderate pain.   TRELEGY ELLIPTA 100-62.5-25 MCG/INH Aepb Generic drug:  Fluticasone-Umeclidin-Vilant INHALE 1 PUFF INTO LUNGS ONCE A DAY   vitamin B-12 1000 MCG tablet Commonly known as:  CYANOCOBALAMIN Take 1,000 mcg by mouth daily.   warfarin 5 MG tablet Commonly known as:  COUMADIN Take as directed. If you are unsure how to take this medication, talk to your nurse or doctor. Original instructions:  TAKE AS DIRECTED BY COUMADIN CLINIC What changed:  See the new instructions.   ZETIA 10 MG tablet Generic drug:  ezetimibe Take 10 mg by mouth daily.       Follow-up Information    [facility] Follow [facility].   Specialty:  Home Health Services Why:  home health services arranged  Contact information: [address] [place] [address] [phone]        [name], MD. Schedule an appointment as soon as possible for a visit in 3 day(s).  Specialty:  Internal Medicine Why:  And your primary cardiologist within a week Contact information: [address] [place] [address] [phone]           Major procedures and Radiology Reports - PLEASE review detailed and final reports thoroughly  -         Dg Chest 2 View  Result Date: 07/14/2018 CLINICAL DATA:  Shortness of breath EXAM: CHEST - 2 VIEW COMPARISON:  07/13/2018 FINDINGS: Left AICD remains in place, unchanged. Prior CABG. Cardiomegaly. Diffuse interstitial opacities throughout the lungs, right greater than left with small bilateral effusions. No real change since prior study. IMPRESSION: Interstitial prominence within the lungs, right greater than left with small effusions. Findings could reflect asymmetric edema or infection. No real change. Electronically Signed   By: Rolm Baptise M.D.   On: 07/14/2018 08:47   Ct Head Wo Contrast  Result Date: 06/22/2018 CLINICAL DATA:  Recent fall with headaches EXAM: CT HEAD WITHOUT CONTRAST TECHNIQUE: Contiguous axial images were obtained from the base of the skull through the vertex without intravenous contrast. COMPARISON:  04/01/2017 FINDINGS: Brain: Diffuse atrophic changes and chronic white matter ischemic changes are seen. No findings to suggest acute hemorrhage, acute infarction or space-occupying mass lesion are noted. Vascular: No hyperdense vessel or unexpected calcification. Skull: Normal. Negative for fracture or focal lesion. Sinuses/Orbits: No acute finding. Other: Right scalp laceration is noted in the forehead consistent with the recent injury. IMPRESSION: Chronic atrophic and ischemic changes without acute abnormality. Right scalp laceration. Electronically Signed   By: [name] M.D.   On: 06/22/2018 12:23   Dg Chest Port 1 View  Result Date: 07/13/2018 CLINICAL DATA:  Three year history of productive cough. History of COPD-emphysema. Former smoker. History of coronary artery disease. EXAM: PORTABLE CHEST 1 VIEW COMPARISON:  Chest x-ray of Mar 02, 2018 FINDINGS: There is chronic volume loss on the right. There is new increased interstitial density in the mid and lower right lung. The left lung is mildly hyperinflated and clear. The cardiac silhouette is enlarged. The  pulmonary vascularity is mildly prominent though stable. The ICD is in stable position. There is calcification in the wall of the aortic arch. The visualized sternal wires are intact. IMPRESSION: COPD. Chronic changes at the right lung base which appears stable. New interstitial infiltrate in the right lung worrisome for pneumonia. Followup PA and lateral chest X-ray is recommended in 3-4 weeks following trial of antibiotic therapy to ensure resolution and exclude underlying malignancy. Mild enlargement of the cardiac silhouette and pulmonary vascular prominence, stable. The ICD is in stable position. Previous CABG. Thoracic aortic atherosclerosis. Electronically Signed   By: David  Martinique M.D.   On: 07/13/2018 12:58   Dg Swallowing Func-speech Pathology  Result Date: 07/14/2018 Objective Swallowing Evaluation: Type of Study: MBS-Modified Barium Swallow Study  Patient Details Name: ISACK LAVALLEY MRN: [id number] Date of Birth: 1934/06/25 Today's Date: 07/14/2018 Time: SLP Start Time (ACUTE ONLY): 2707 -SLP Stop Time (ACUTE ONLY): 0952 SLP Time Calculation (min) (ACUTE ONLY): 16 min Past Medical History: Past Medical History: Diagnosis Date . Atrial fibrillation ([place])   persistent, previously seen at [facility] and placed on amiodarone . Benign prostatic hypertrophy  . CAD (coronary artery disease)   multivessel s/p inferolateral wall MI with subsequent CABG 11/1998.  Cath 2009 with Patent grafts . Cleft palate  . COPD with emphysema ([place]) 04/01/2010 . DM (diabetes mellitus), type 2 ([place])  . Dyspnea  . GERD (gastroesophageal reflux disease)  .  HTN (hypertension)  . Hyperlipidemia  . Hypothyroidism  . Iron deficiency anemia  . Ischemic dilated cardiomyopathy ([place])   EF 35-40% by MUGA 6/11 . Myocardial infarction ([place])  . Nasal septal deviation  . Nephrolithiasis  . OSA (obstructive sleep apnea)  . PAF (paroxysmal atrial fibrillation) ([place])  . Peripheral arterial disease (HCC)   left subclavian artery stenosis . PNA  (pneumonia)  . Psoriasis  . Seborrheic keratosis  . Stroke ([place])  . Systolic congestive heart failure ([place]) 2009  s/p BiV ICD implantation by Dr Leonia Reeves (MDT) Past Surgical History: Past Surgical History: Procedure Laterality Date . BI-VENTRICULAR IMPLANTABLE CARDIOVERTER DEFIBRILLATOR  (CRT-D)  10-08-08; 11-06-2013  Dr Leonia Reeves (MDT) implant for primary prevention; gen change to MDT VivaXT CRTD by Dr Rayann Heman . BIV ICD GENERTAOR CHANGE OUT N/A 11/06/2013  Procedure: BIV ICD GENERTAOR CHANGE OUT;  Surgeon: Coralyn Mark, MD;  Location[facility] C[facility] LAB;  Service: Cardiovascular;  Laterality: N/A; . c-spine surgery   . CARDIOVERSION N/A 04/29/2016  Procedure: CARDIOVERSION;  Surgeon: Pixie Casino, MD;  Location: [facility] ENDOSCOPY;  Service: Cardiovascular;  Laterality: N/A; . CARPAL TUNNEL RELEASE   . CATARACT EXTRACTION   . CORONARY ARTERY BYPASS GRAFT    LIMA to LAD, SVG to OM, SVG to diagonal . ELECTROPHYSIOLOGIC STUDY N/A 06/11/2016  Procedure: Atrial Fibrillation Ablation;  Surgeon: Thompson Grayer, MD;  Location: [facility] CV LAB;  Service: Cardiovascular;  Laterality: N/A; . left cleft palate and left cleft lip repair   HPI: [name] is a 84 y.o. male with medical history significant of Ischemic cardiomyopathy EF 35 -40 %, A fib  s/p BiV PM on coumadin, COPD, not on home oxygen; who presented with complaints of chills and low blood pressure.  Severe sepsis 2/2 community-acquired pneumonia: Acute.  Patient presented with chills and chronic cough found to have fever of 100.9 F with WBC elevated at 17.1, and initial lactate elevated at 4.09.  Chest x-ray showing signs of right lower lobe pneumonia.  Note pt has a palatal obturator secondary to cleft lip/palate restoration. MBS ordered to objectively assess the swallowing function.  No data recorded Assessment / Plan / Recommendation CHL IP CLINICAL IMPRESSIONS 07/14/2018 Clinical Impression Patient presents with mild oropharyngeal primary respiratory based dysphagia  characterized by dis-coordination of respiration and swallowing and question of decreased strength.  Swallow delay triggering at the level of the pyriforms with thin liquids.   One episode of trace aspiration of thin liquids was noted seemingly related to discoordination of swallow and respiration that was immediately sensed and cleared by a reflexive cough.  Across consistencies and textures, note valleculae residue ranging from a mild to moderate amount; Pt often demonstrated a reflexive repeat swallow which assisted in clearing residue. Question mildly decreased BOT strength possibly related to cleft reconstruction and presence of palatal obturator, though BOT ROM appeared adequate.  Recommend continue with regular textures and thin liquids. Recommend no straws, repeat swallows throughout PO and respiratory precautions (handout provided to patient). Also recommend vigilent oral hygiene routine including cleansing oral hardware after all meals and soaking over night to decrease bacteria. ST will continue to follow while in acute setting. SLP Visit Diagnosis Dysphagia, unspecified (R13.10) Attention and concentration deficit following -- Frontal lobe and executive function deficit following -- Impact on safety and function Moderate aspiration risk   CHL IP TREATMENT RECOMMENDATION 07/14/2018 Treatment Recommendations Therapy as outlined in treatment plan below   Prognosis 07/14/2018 Prognosis for Safe Diet Advancement Good Barriers to Reach  Goals -- Barriers/Prognosis Comment -- CHL IP DIET RECOMMENDATION 07/14/2018 SLP Diet Recommendations Regular solids;Thin liquid Liquid Administration via Cup;No straw Medication Administration Whole meds with liquid Compensations Slow rate;Small sips/bites;Follow solids with liquid Postural Changes Remain semi-upright after after feeds/meals (Comment);Seated upright at 90 degrees   CHL IP OTHER RECOMMENDATIONS 07/14/2018 Recommended Consults -- Oral Care Recommendations Oral care  BID Other Recommendations --   CHL IP FOLLOW UP RECOMMENDATIONS 07/14/2018 Follow up Recommendations Home health SLP   CHL IP FREQUENCY AND DURATION 07/14/2018 Speech Therapy Frequency (ACUTE ONLY) min 1 x/week Treatment Duration 2 weeks      CHL IP ORAL PHASE 07/14/2018 Oral Phase WFL Oral - Pudding Teaspoon -- Oral - Pudding Cup -- Oral - Honey Teaspoon -- Oral - Honey Cup -- Oral - Nectar Teaspoon -- Oral - Nectar Cup -- Oral - Nectar Straw -- Oral - Thin Teaspoon -- Oral - Thin Cup WFL Oral - Thin Straw -- Oral - Puree WFL Oral - Mech Soft -- Oral - Regular WFL Oral - Multi-Consistency -- Oral - Pill -- Oral Phase - Comment --  CHL IP PHARYNGEAL PHASE 07/14/2018 Pharyngeal Phase Impaired Pharyngeal- Pudding Teaspoon -- Pharyngeal -- Pharyngeal- Pudding Cup -- Pharyngeal -- Pharyngeal- Honey Teaspoon -- Pharyngeal -- Pharyngeal- Honey Cup -- Pharyngeal -- Pharyngeal- Nectar Teaspoon -- Pharyngeal -- Pharyngeal- Nectar Cup -- Pharyngeal -- Pharyngeal- Nectar Straw -- Pharyngeal -- Pharyngeal- Thin Teaspoon -- Pharyngeal -- Pharyngeal- Thin Cup Delayed swallow initiation-vallecula;Penetration/Apiration after swallow;Penetration/Aspiration before swallow;Trace aspiration;Reduced tongue base retraction;Delayed swallow initiation-pyriform sinuses;Pharyngeal residue - valleculae Pharyngeal -- Pharyngeal- Thin Straw -- Pharyngeal -- Pharyngeal- Puree Delayed swallow initiation-vallecula Pharyngeal -- Pharyngeal- Mechanical Soft -- Pharyngeal -- Pharyngeal- Regular Delayed swallow initiation-vallecula Pharyngeal -- Pharyngeal- Multi-consistency -- Pharyngeal -- Pharyngeal- Pill WFL Pharyngeal -- Pharyngeal Comment --  CHL IP CERVICAL ESOPHAGEAL PHASE 07/14/2018 Cervical Esophageal Phase Impaired Pudding Teaspoon -- Pudding Cup -- Honey Teaspoon -- Honey Cup -- Nectar Teaspoon -- Nectar Cup -- Nectar Straw -- Thin Teaspoon -- Thin Cup -- Thin Straw -- Puree -- Mechanical Soft -- Regular -- Multi-consistency -- Pill -- Cervical  Esophageal Comment -- Amelia H. Roddie Mc, CCC-SLP Speech Language Pathologist Wende Bushy 07/14/2018, 2:47 PM              Xr C-arm No Report  Result Date: 06/23/2018 Please see Notes tab for imaging impression.   Micro Results    Recent Results (from the past 240 hour(s))  Blood Culture (routine x 2)     Status: None (Preliminary result)   Collection Time: 07/13/18 12:25 PM  Result Value Ref Range Status   Specimen Description BLOOD LEFT ANTECUBITAL  Final   Special Requests   Final    BOTTLES DRAWN AEROBIC AND ANAEROBIC Blood Culture adequate volume   Culture   Final    NO GROWTH 2 DAYS Performed at [facility], [address], [place], [place] [address]    Report Status PENDING  Incomplete  Blood Culture (routine x 2)     Status: None (Preliminary result)   Collection Time: 07/13/18 12:30 PM  Result Value Ref Range Status   Specimen Description BLOOD RIGHT ANTECUBITAL  Final   Special Requests   Final    BOTTLES DRAWN AEROBIC AND ANAEROBIC Blood Culture adequate volume   Culture   Final    NO GROWTH 2 DAYS Performed at [facility], [place] [address] [place], [place] [address]    Report Status PENDING  Incomplete  Respiratory  Panel by PCR     Status: None   Collection Time: 07/13/18  3:47 PM  Result Value Ref Range Status   Adenovirus NOT DETECTED NOT DETECTED Final   Coronavirus 229E NOT DETECTED NOT DETECTED Final   Coronavirus HKU1 NOT DETECTED NOT DETECTED Final   Coronavirus NL63 NOT DETECTED NOT DETECTED Final   Coronavirus OC43 NOT DETECTED NOT DETECTED Final   Metapneumovirus NOT DETECTED NOT DETECTED Final   Rhinovirus / Enterovirus NOT DETECTED NOT DETECTED Final   Influenza A NOT DETECTED NOT DETECTED Final   Influenza B NOT DETECTED NOT DETECTED Final   Parainfluenza Virus 1 NOT DETECTED NOT DETECTED Final   Parainfluenza Virus 2 NOT DETECTED NOT DETECTED Final   Parainfluenza Virus 3 NOT DETECTED NOT DETECTED Final   Parainfluenza  Virus 4 NOT DETECTED NOT DETECTED Final   Respiratory Syncytial Virus NOT DETECTED NOT DETECTED Final   Bordetella pertussis NOT DETECTED NOT DETECTED Final   Chlamydophila pneumoniae NOT DETECTED NOT DETECTED Final   Mycoplasma pneumoniae NOT DETECTED NOT DETECTED Final    Comment: Performed at [facility], [place] [address], [place], [place] [address]  Culture, sputum-assessment     Status: None   Collection Time: 07/14/18 11:41 PM  Result Value Ref Range Status   Specimen Description EXPECTORATED SPUTUM  Final   Special Requests NONE  Final   Sputum evaluation   Final    Sputum specimen not acceptable for testing.  Please recollect.   RESULT CALLED TO, READ BACK BY AND VERIFIED WITHDory Larsen RN [phone] 07/15/18 A BROWNING Performed at [facility], [place] [address], [place], [place] [address]    Report Status 07/15/2018 FINAL  Final    Today   Subjective    Traci Plemons today has no headache,no chest abdominal pain,no new weakness tingling or numbness, feels much better wants to go home today.    Objective   Blood pressure 139/73, pulse 78, temperature 98.4 F (36.9 C), temperature source Oral, resp. rate 18, height 5\' 6"  (1.676 m), weight 90.1 kg, SpO2 97 %.   Intake/Output Summary (Last 24 hours) at 07/15/2018 0923 Last data filed at 07/14/2018 2206 Gross per 24 hour  Intake 363 ml  Output -  Net 363 ml    Exam Awake Alert, Oriented x 3, No new F.N deficits, Normal affect [place].AT,PERRAL Supple Neck,No JVD, No cervical lymphadenopathy appriciated.  Symmetrical Chest wall movement, Good air movement bilaterally, CTAB RRR,No Gallops,Rubs or new Murmurs, No Parasternal Heave +ve B.Sounds, Abd Soft, Non tender, No organomegaly appriciated, No rebound -guarding or rigidity. No Cyanosis, Clubbing or edema, No new Rash or bruise   Data Review   CBC w Diff:  Lab Results  Component Value Date   WBC 10.0 07/15/2018   HGB 13.0 07/15/2018   HCT 38.9 (L)  07/15/2018   PLT 161 07/15/2018   LYMPHOPCT 5 07/13/2018   MONOPCT 5 07/13/2018   EOSPCT 0 07/13/2018   BASOPCT 0 07/13/2018    CMP:  Lab Results  Component Value Date   NA 137 07/15/2018   NA 139 03/13/2018   K 3.1 (L) 07/15/2018   CL 99 07/15/2018   CO2 28 07/15/2018   BUN 19 07/15/2018   BUN 19 03/13/2018   CREATININE 1.04 07/15/2018   CREATININE 1.69 (H) 07/02/2016   PROT 6.0 (L) 07/13/2018   PROT 6.5 12/30/2017   ALBUMIN 2.9 (L) 07/13/2018   ALBUMIN 3.9 12/30/2017   BILITOT 0.9 07/13/2018  BILITOT 0.4 12/30/2017   ALKPHOS 68 07/13/2018   AST 26 07/13/2018   ALT 15 07/13/2018  .   Total Time in preparing paper work, data evaluation and todays exam - 65 minutes  Lala Lund M.D on 07/15/2018 at 9:23 AM  [facility]   Office  [phone]

## 2018-07-15 NOTE — Progress Notes (Signed)
Physical Therapy Treatment Patient Details Name: [name] MRN: [id number] DOB: 1934-07-12 Today's Date: 07/15/2018    History of Present Illness Pt is an 84 y.o. male with medical history significant of Ischemic cardiomyopathy EF 35 -40 %, A fib  s/p BiV PM on coumadin, COPD, not on home oxygen; who presented with complaints of chills and low blood pressure, admitted to the hospital with pneumonia and shortness of breath.    PT Comments    Pt making steady progress with functional mobility. Pt would continue to benefit from skilled physical therapy services at this time while admitted and after d/c to address the below listed limitations in order to improve overall safety and independence with functional mobility.    Follow Up Recommendations  Home health PT;Supervision/Assistance - 24 hour     Equipment Recommendations  None recommended by PT    Recommendations for Other Services       Precautions / Restrictions Precautions Precautions: Fall Restrictions Weight Bearing Restrictions: No    Mobility  Bed Mobility               General bed mobility comments: Pt received in recliner.  Transfers Overall transfer level: Needs assistance Equipment used: Rolling walker (2 wheeled) Transfers: Sit to/from Stand Sit to Stand: Min guard         General transfer comment: min guard for safety  Ambulation/Gait Ambulation/Gait assistance: Supervision Gait Distance (Feet): 200 Feet Assistive device: Rolling walker (2 wheeled) Gait Pattern/deviations: Step-through pattern;Decreased stride length;Trunk flexed Gait velocity: decreased Gait velocity interpretation: 1.31 - 2.62 ft/sec, indicative of limited community ambulator General Gait Details: supervision for safety, pt steady with RW, occasional cues to maintain a safe distance   Chief Strategy Officer    Modified Rankin (Stroke Patients Only)       Balance Overall balance  assessment: Needs assistance Sitting-balance support: Feet supported Sitting balance-Leahy Scale: Good     Standing balance support: Bilateral upper extremity supported Standing balance-Leahy Scale: Poor                              Cognition Arousal/Alertness: Awake/alert Behavior During Therapy: WFL for tasks assessed/performed Overall Cognitive Status: Within Functional Limits for tasks assessed                                        Exercises      General Comments        Pertinent Vitals/Pain Pain Assessment: No/denies pain    Home Living                      Prior Function            PT Goals (current goals can now be found in the care plan section) Acute Rehab PT Goals PT Goal Formulation: With patient/family Time For Goal Achievement: 07/28/18 Potential to Achieve Goals: Good Progress towards PT goals: Progressing toward goals    Frequency    Min 3X/week      PT Plan Current plan remains appropriate    Co-evaluation              AM-PAC PT "6 Clicks" Daily Activity  Outcome Measure  Difficulty turning over in bed (including adjusting bedclothes, sheets and blankets)?: None Difficulty moving  from lying on back to sitting on the side of the bed? : None Difficulty sitting down on and standing up from a chair with arms (e.g., wheelchair, bedside commode, etc,.)?: Unable Help needed moving to and from a bed to chair (including a wheelchair)?: None Help needed walking in hospital room?: None Help needed climbing 3-5 steps with a railing? : A Little 6 Click Score: 20    End of Session Equipment Utilized During Treatment: Gait belt Activity Tolerance: Patient tolerated treatment well Patient left: in chair;with call bell/phone within reach;with chair alarm set Nurse Communication: Mobility status PT Visit Diagnosis: Other abnormalities of gait and mobility (R26.89)     Time: 3009-2330 PT Time Calculation  (min) (ACUTE ONLY): 13 min  Charges:  $Gait Training: 8-22 mins                     Sherie Don, [place], DPT  Acute Rehabilitation Services Pager [phone] [employer] [place] 07/15/2018, 11:54 AM

## 2018-07-15 NOTE — Progress Notes (Signed)
ANTICOAGULATION CONSULT NOTE - Follow Up Consult  Pharmacy Consult for Warfarin Indication: atrial fibrillation  Allergies  Allergen Reactions  . Aldactone [Spironolactone] Other (See Comments)    Hyperkalemia  reported by Dr. [name] - pt is currently taking 12.5 mg daily -November 2017 per medication list by same MD    Patient Measurements: Height: 5\' 6"  (167.6 cm) Weight: 198 lb 10.2 oz (90.1 kg) IBW/kg (Calculated) : 63.8  Vital Signs: Temp: 98.7 F (37.1 C) (09/21 1322) Temp Source: Oral (09/21 0311) BP: 101/59 (09/21 1322) Pulse Rate: 69 (09/21 1322)  Labs: Recent Labs    07/13/18 1233 07/14/18 0029 07/15/18 0505  HGB 13.9 12.0* 13.0  HCT 42.0 36.3* 38.9*  PLT 153 132* 161  LABPROT 39.4* 42.0* 24.0*  INR 4.09* 4.45* 2.17  CREATININE 1.25* 1.16 1.04    Estimated Creatinine Clearance: 57.6 mL/min (by C-G formula based on SCr of 1.04 mg/dL).  Assessment: 84 year old male on warfarin prior to admission for Afib Admitted with pneumonia/sepsis INR elevated on admission at 4.09. INR back down to 2.17 today. MD will dc on home dose.   Goal of Therapy:  INR 2-3 Monitor platelets by anticoagulation protocol: Yes   Plan:   Resume home dose coumadin at [place], PharmD, [place], AAHIVP, CPP Infectious Disease Pharmacist Pager: [phone] 07/15/2018 1:47 PM

## 2018-07-15 NOTE — Progress Notes (Signed)
Nsg Discharge Note  Admit Date:  07/13/2018 Discharge date: 07/15/2018   [place] to be D/C'd Home per MD order.  AVS completed.  Copy for chart, and copy for patient signed, and dated. Patient/caregiver able to verbalize understanding.  Discharge Medication: Allergies as of 07/15/2018      Reactions   Aldactone [spironolactone] Other (See Comments)   Hyperkalemia  reported by Dr. [name] - pt is currently taking 12.5 mg daily -November 2017 per medication list by same MD      Medication List    TAKE these medications   albuterol 108 (90 Base) MCG/ACT inhaler Commonly known as:  PROVENTIL HFA;VENTOLIN HFA Inhale 2 puffs into the lungs every 6 (six) hours as needed for wheezing or shortness of breath. Must keep June 2016 appt.   albuterol (2.5 MG/3ML) 0.083% nebulizer solution Commonly known as:  PROVENTIL USE 1 VIAL VIA NEBULIZER 3 TIMES A DAY AS DIRECTED. DX:J44.9   amiodarone 400 MG tablet Commonly known as:  PACERONE TAKE HALF TABLET (200 MG TOTAL) BY MOUTH DAILY.   atorvastatin 80 MG tablet Commonly known as:  LIPITOR TAKE 1 TABLET (80 MG TOTAL) BY MOUTH DAILY.   azithromycin 500 MG tablet Commonly known as:  ZITHROMAX Take 1 tablet (500 mg total) by mouth daily.   bisacodyl 5 MG EC tablet Commonly known as:  DULCOLAX Take 2 tablets (10 mg total) by mouth daily at 12 noon.   carvedilol 6.25 MG tablet Commonly known as:  COREG TAKE 1 TABLET BY MOUTH TWICE A DAY   cholecalciferol 1000 units tablet Commonly known as:  VITAMIN D Take 2,000 Units by mouth every evening.   dextromethorphan-guaiFENesin 30-600 MG 12hr tablet Commonly known as:  MUCINEX DM Take 1 tablet by mouth 2 (two) times daily as needed for cough.   ferrous sulfate 325 (65 FE) MG tablet Take 325 mg by mouth every evening.   fluticasone 50 MCG/ACT nasal spray Commonly known as:  FLONASE SPRAY 2 SPRAYS INTO EACH NOSTRIL EVERY DAY   furosemide 40 MG tablet Commonly known as:   LASIX Take 3 tablets (120 mg total) by mouth 2 (two) times daily. What changed:  how much to take   insulin degludec 100 UNIT/ML Sopn FlexTouch Pen Commonly known as:  TRESIBA Inject 0.3 mLs (30 Units total) into the skin daily before lunch. What changed:  how much to take   levothyroxine 100 MCG tablet Commonly known as:  SYNTHROID, LEVOTHROID Take 100 mcg by mouth daily before breakfast.   NOVOLOG FLEXPEN 100 UNIT/ML FlexPen Generic drug:  insulin aspart Inject 4 Units into the skin 3 (three) times daily before meals.   Potassium Chloride ER 20 MEQ Tbcr Take 20 mEq by mouth daily.   PRESCRIPTION MEDICATION Inhale into the lungs at bedtime. CPAP   PRESERVISION AREDS PO Take 1 tablet by mouth 2 (two) times daily.   SENOKOT 8.6 MG tablet Generic drug:  senna Take 2 tablets by mouth at bedtime.   SPIRIVA RESPIMAT 2.5 MCG/ACT Aers Generic drug:  Tiotropium Bromide Monohydrate INHALE 2 PUFFS INTO THE LUNGS DAILY.   spironolactone 25 MG tablet Commonly known as:  ALDACTONE TAKE 1/2 TABLET (12.5 MG TOTAL) BY MOUTH DAILY.   traMADol 50 MG tablet Commonly known as:  ULTRAM Take 50 mg by mouth 3 (three) times daily as needed for moderate pain.   TRELEGY ELLIPTA 100-62.5-25 MCG/INH Aepb Generic drug:  Fluticasone-Umeclidin-Vilant INHALE 1 PUFF INTO LUNGS ONCE A DAY   vitamin B-12 1000  MCG tablet Commonly known as:  CYANOCOBALAMIN Take 1,000 mcg by mouth daily.   warfarin 5 MG tablet Commonly known as:  COUMADIN Take as directed. If you are unsure how to take this medication, talk to your nurse or doctor. Original instructions:  TAKE AS DIRECTED BY COUMADIN CLINIC What changed:  See the new instructions.   ZETIA 10 MG tablet Generic drug:  ezetimibe Take 10 mg by mouth daily.       Discharge Assessment: Vitals:   07/15/18 0803 07/15/18 1322  BP:  (!) 101/59  Pulse:  69  Resp:  20  Temp:  98.7 F (37.1 C)  SpO2: 97% 92%   Skin clean, dry and intact without  evidence of skin break down, no evidence of skin tears noted. IV catheter discontinued intact. Site without signs and symptoms of complications - no redness or edema noted at insertion site, patient denies c/o pain - only slight tenderness at site.  Dressing with slight pressure applied.  D/c Instructions-Education: Discharge instructions given to patient/family with verbalized understanding. D/c education completed with patient/family including follow up instructions, medication list, d/c activities limitations if indicated, with other d/c instructions as indicated by MD - patient able to verbalize understanding, all questions fully answered. Patient instructed to return to ED, call 911, or call MD for any changes in condition.  Patient escorted via [place], and D/C home via private auto.  Salley Slaughter, RN 07/15/2018 4:24 PM

## 2018-07-16 DIAGNOSIS — E119 Type 2 diabetes mellitus without complications: Secondary | ICD-10-CM | POA: Diagnosis not present

## 2018-07-16 DIAGNOSIS — Z951 Presence of aortocoronary bypass graft: Secondary | ICD-10-CM | POA: Diagnosis not present

## 2018-07-16 DIAGNOSIS — D509 Iron deficiency anemia, unspecified: Secondary | ICD-10-CM | POA: Diagnosis not present

## 2018-07-16 DIAGNOSIS — Z87891 Personal history of nicotine dependence: Secondary | ICD-10-CM | POA: Diagnosis not present

## 2018-07-16 DIAGNOSIS — I5022 Chronic systolic (congestive) heart failure: Secondary | ICD-10-CM | POA: Diagnosis not present

## 2018-07-16 DIAGNOSIS — Z7901 Long term (current) use of anticoagulants: Secondary | ICD-10-CM | POA: Diagnosis not present

## 2018-07-16 DIAGNOSIS — Z950 Presence of cardiac pacemaker: Secondary | ICD-10-CM | POA: Diagnosis not present

## 2018-07-16 DIAGNOSIS — Z9989 Dependence on other enabling machines and devices: Secondary | ICD-10-CM | POA: Diagnosis not present

## 2018-07-16 DIAGNOSIS — I2510 Atherosclerotic heart disease of native coronary artery without angina pectoris: Secondary | ICD-10-CM | POA: Diagnosis not present

## 2018-07-16 DIAGNOSIS — N183 Chronic kidney disease, stage 3 (moderate): Secondary | ICD-10-CM | POA: Diagnosis not present

## 2018-07-16 DIAGNOSIS — I480 Paroxysmal atrial fibrillation: Secondary | ICD-10-CM | POA: Diagnosis not present

## 2018-07-16 DIAGNOSIS — Z9181 History of falling: Secondary | ICD-10-CM | POA: Diagnosis not present

## 2018-07-16 DIAGNOSIS — Z794 Long term (current) use of insulin: Secondary | ICD-10-CM | POA: Diagnosis not present

## 2018-07-16 DIAGNOSIS — G4730 Sleep apnea, unspecified: Secondary | ICD-10-CM | POA: Diagnosis not present

## 2018-07-16 DIAGNOSIS — I739 Peripheral vascular disease, unspecified: Secondary | ICD-10-CM | POA: Diagnosis not present

## 2018-07-16 DIAGNOSIS — I130 Hypertensive heart and chronic kidney disease with heart failure and stage 1 through stage 4 chronic kidney disease, or unspecified chronic kidney disease: Secondary | ICD-10-CM | POA: Diagnosis not present

## 2018-07-16 DIAGNOSIS — Z7952 Long term (current) use of systemic steroids: Secondary | ICD-10-CM | POA: Diagnosis not present

## 2018-07-16 DIAGNOSIS — J449 Chronic obstructive pulmonary disease, unspecified: Secondary | ICD-10-CM | POA: Diagnosis not present

## 2018-07-18 DIAGNOSIS — I5023 Acute on chronic systolic (congestive) heart failure: Secondary | ICD-10-CM | POA: Diagnosis not present

## 2018-07-18 DIAGNOSIS — E876 Hypokalemia: Secondary | ICD-10-CM | POA: Diagnosis not present

## 2018-07-18 DIAGNOSIS — Z7901 Long term (current) use of anticoagulants: Secondary | ICD-10-CM | POA: Diagnosis not present

## 2018-07-18 DIAGNOSIS — J181 Lobar pneumonia, unspecified organism: Secondary | ICD-10-CM | POA: Diagnosis not present

## 2018-07-18 LAB — CULTURE, BLOOD (ROUTINE X 2)
Culture: NO GROWTH
Culture: NO GROWTH
Special Requests: ADEQUATE
Special Requests: ADEQUATE

## 2018-07-19 ENCOUNTER — Telehealth: Payer: Self-pay | Admitting: *Deleted

## 2018-07-19 NOTE — Telephone Encounter (Signed)
Spouse called states he was admitted to hospital on 9/19 to 9/21 for SOB and cough. INR on admission was 4.09, no coumadin doses given during admission. Pt D/C home on normal dose but spouse states he missed Saturday's dosage. INR yesterday at PCP office per spouse was 1.3, he did miss Saturday's dose, thus he will take 5mg  today and 7.5mg  tomorrow and keep f/u appt on Monday.

## 2018-07-24 ENCOUNTER — Ambulatory Visit (INDEPENDENT_AMBULATORY_CARE_PROVIDER_SITE_OTHER): Payer: PPO | Admitting: *Deleted

## 2018-07-24 DIAGNOSIS — Z5181 Encounter for therapeutic drug level monitoring: Secondary | ICD-10-CM

## 2018-07-24 DIAGNOSIS — I481 Persistent atrial fibrillation: Secondary | ICD-10-CM | POA: Diagnosis not present

## 2018-07-24 DIAGNOSIS — Z7901 Long term (current) use of anticoagulants: Secondary | ICD-10-CM

## 2018-07-24 DIAGNOSIS — I480 Paroxysmal atrial fibrillation: Secondary | ICD-10-CM

## 2018-07-24 DIAGNOSIS — I4819 Other persistent atrial fibrillation: Secondary | ICD-10-CM

## 2018-07-24 LAB — POCT INR: INR: 3.5 — AB (ref 2.0–3.0)

## 2018-07-24 NOTE — Patient Instructions (Signed)
Description   Do not take any Coumadin today then continue on same dosage 1 tablet everyday except 1/2 tablet on Wednesdays.  Continue eating 2-3 servings of leafy green vegetable each week.  Recheck in 2 weeks.   Call [phone] with any new medications.

## 2018-07-26 LAB — CUP PACEART REMOTE DEVICE CHECK
Battery Remaining Longevity: 17 mo
Battery Voltage: 2.91 V
Brady Statistic AP VP Percent: 95.95 %
Brady Statistic AP VS Percent: 0.33 %
Brady Statistic AS VP Percent: 3.69 %
Brady Statistic AS VS Percent: 0.03 %
Brady Statistic RA Percent Paced: 95.87 %
Brady Statistic RV Percent Paced: 98.71 %
Date Time Interrogation Session: 20190919071805
HighPow Impedance: 51 Ohm
HighPow Impedance: 62 Ohm
Implantable Lead Implant Date: 20091215
Implantable Lead Implant Date: 20091215
Implantable Lead Implant Date: 20091215
Implantable Lead Location: 753858
Implantable Lead Location: 753859
Implantable Lead Location: 753860
Implantable Lead Model: 4196
Implantable Lead Model: 5076
Implantable Lead Model: 6947
Implantable Pulse Generator Implant Date: 20150113
Lead Channel Impedance Value: 4047 Ohm
Lead Channel Impedance Value: 456 Ohm
Lead Channel Impedance Value: 513 Ohm
Lead Channel Impedance Value: 532 Ohm
Lead Channel Impedance Value: 608 Ohm
Lead Channel Impedance Value: 893 Ohm
Lead Channel Pacing Threshold Amplitude: 0.875 V
Lead Channel Pacing Threshold Amplitude: 0.875 V
Lead Channel Pacing Threshold Amplitude: 1.375 V
Lead Channel Pacing Threshold Pulse Width: 0.4 ms
Lead Channel Pacing Threshold Pulse Width: 0.4 ms
Lead Channel Pacing Threshold Pulse Width: 0.6 ms
Lead Channel Sensing Intrinsic Amplitude: 0.5 mV
Lead Channel Sensing Intrinsic Amplitude: 0.5 mV
Lead Channel Sensing Intrinsic Amplitude: 16.625 mV
Lead Channel Sensing Intrinsic Amplitude: 16.625 mV
Lead Channel Setting Pacing Amplitude: 1.5 V
Lead Channel Setting Pacing Amplitude: 1.75 V
Lead Channel Setting Pacing Amplitude: 2.5 V
Lead Channel Setting Pacing Pulse Width: 0.6 ms
Lead Channel Setting Pacing Pulse Width: 1 ms
Lead Channel Setting Sensing Sensitivity: 0.3 mV

## 2018-07-31 ENCOUNTER — Ambulatory Visit (INDEPENDENT_AMBULATORY_CARE_PROVIDER_SITE_OTHER): Payer: PPO

## 2018-07-31 ENCOUNTER — Telehealth: Payer: Self-pay

## 2018-07-31 DIAGNOSIS — I5022 Chronic systolic (congestive) heart failure: Secondary | ICD-10-CM

## 2018-07-31 DIAGNOSIS — Z95810 Presence of automatic (implantable) cardiac defibrillator: Secondary | ICD-10-CM | POA: Diagnosis not present

## 2018-07-31 NOTE — Progress Notes (Signed)
EPIC Encounter for ICM Monitoring  Patient Name: [name] is a 84 y.o. male Date: 07/31/2018 Primary Care Physican: [name], MD Primary [place] Electrophysiologist: Allred Dry Weight:Previous weight207lbs      Attempted call to wife.  Left detailed message per DPR regarding transmission. Transmission reviewed.   Hospitalization for pnumonia 9/19   Thoracic impedance normal but was abnormal suggesting fluid accumulation that correlates with hospitalization 07/13/2018 for pneumonia.  Prescribed: Furosemide40 mgtake3tablets (120 mg total) twice a day.  Labs: 07/15/2018 Creatinine 1.04, BUN 19, Potassium 3.1, Sodium 137, EGFR >60 07/14/2018 Creatinine 1.16, BUN 20, Potassium 3.5, Sodium 137, EGFR 57->60 07/13/2018 Creatinine 1.25, BUN 17, Potassium 3.9, Sodium 137, EGFR 52->60 05/04/2018 Creatinine 1.76, BUN 26, Potassium 4.2, Sodium 139, EGFR 34-40 02/27/2018 Creatinine 1.23, BUN 16, Potassium 4.3, Sodium 139, EGFR 54-63, BNP 10,884 12/30/2017 Creatinine 1.24 BUN 15, Potassium 4.6, Sodium 139, EGFR 54-60  Recommendations: Left voice mail with ICM number and encouraged to call if experiencing any fluid symptoms.  Follow-up plan: ICM clinic phone appointment on 08/31/2018.    Copy of ICM check sent to Dr. Rayann Heman.   3 month ICM trend: 07/31/2018    1 Year ICM trend:       Rosalene Billings, RN 07/31/2018 5:47 PM

## 2018-07-31 NOTE — Telephone Encounter (Signed)
Spoke with wife and reminded of remote transmission that is due today. She verbalized understanding.

## 2018-08-01 ENCOUNTER — Telehealth: Payer: Self-pay

## 2018-08-01 NOTE — Telephone Encounter (Signed)
Remote ICM transmission received.  Attempted call to wife and left detailed message, per DPR, regarding transmission and next ICM scheduled for 08/31/2018.  Advised to return call for any fluid symptoms or questions.

## 2018-08-02 DIAGNOSIS — Z79899 Other long term (current) drug therapy: Secondary | ICD-10-CM | POA: Diagnosis not present

## 2018-08-03 ENCOUNTER — Ambulatory Visit
Admission: RE | Admit: 2018-08-03 | Discharge: 2018-08-03 | Disposition: A | Discharge status: Home / Self Care | Payer: PPO | Source: Ambulatory Visit | Attending: Internal Medicine | Admitting: Internal Medicine

## 2018-08-03 ENCOUNTER — Other Ambulatory Visit: Payer: Self-pay | Admitting: Internal Medicine

## 2018-08-03 DIAGNOSIS — J189 Pneumonia, unspecified organism: Secondary | ICD-10-CM

## 2018-08-03 DIAGNOSIS — J181 Lobar pneumonia, unspecified organism: Secondary | ICD-10-CM | POA: Diagnosis not present

## 2018-08-03 DIAGNOSIS — Z23 Encounter for immunization: Secondary | ICD-10-CM | POA: Diagnosis not present

## 2018-08-07 ENCOUNTER — Ambulatory Visit (INDEPENDENT_AMBULATORY_CARE_PROVIDER_SITE_OTHER): Payer: PPO | Admitting: *Deleted

## 2018-08-07 DIAGNOSIS — I480 Paroxysmal atrial fibrillation: Secondary | ICD-10-CM

## 2018-08-07 DIAGNOSIS — Z7901 Long term (current) use of anticoagulants: Secondary | ICD-10-CM | POA: Diagnosis not present

## 2018-08-07 DIAGNOSIS — I4819 Other persistent atrial fibrillation: Secondary | ICD-10-CM | POA: Diagnosis not present

## 2018-08-07 DIAGNOSIS — Z5181 Encounter for therapeutic drug level monitoring: Secondary | ICD-10-CM

## 2018-08-07 LAB — POCT INR: INR: 3.9 — AB (ref 2.0–3.0)

## 2018-08-07 NOTE — Patient Instructions (Signed)
Description   Do not take any Coumadin today then change dose to 1 tablet everyday except 1/2 tablet on Wednesdays and Saturdays.  Continue eating 2-3 servings of leafy green vegetable each week.  Recheck in 2 weeks.   Call [phone] with any new medications.

## 2018-08-16 ENCOUNTER — Other Ambulatory Visit: Payer: Self-pay | Admitting: Pulmonary Disease

## 2018-08-17 DIAGNOSIS — I5022 Chronic systolic (congestive) heart failure: Secondary | ICD-10-CM | POA: Diagnosis not present

## 2018-08-17 DIAGNOSIS — Z7952 Long term (current) use of systemic steroids: Secondary | ICD-10-CM | POA: Diagnosis not present

## 2018-08-17 DIAGNOSIS — Z951 Presence of aortocoronary bypass graft: Secondary | ICD-10-CM | POA: Diagnosis not present

## 2018-08-17 DIAGNOSIS — I130 Hypertensive heart and chronic kidney disease with heart failure and stage 1 through stage 4 chronic kidney disease, or unspecified chronic kidney disease: Secondary | ICD-10-CM | POA: Diagnosis not present

## 2018-08-17 DIAGNOSIS — I739 Peripheral vascular disease, unspecified: Secondary | ICD-10-CM | POA: Diagnosis not present

## 2018-08-17 DIAGNOSIS — E119 Type 2 diabetes mellitus without complications: Secondary | ICD-10-CM | POA: Diagnosis not present

## 2018-08-17 DIAGNOSIS — Z7901 Long term (current) use of anticoagulants: Secondary | ICD-10-CM | POA: Diagnosis not present

## 2018-08-17 DIAGNOSIS — I2510 Atherosclerotic heart disease of native coronary artery without angina pectoris: Secondary | ICD-10-CM | POA: Diagnosis not present

## 2018-08-17 DIAGNOSIS — I480 Paroxysmal atrial fibrillation: Secondary | ICD-10-CM | POA: Diagnosis not present

## 2018-08-17 DIAGNOSIS — D509 Iron deficiency anemia, unspecified: Secondary | ICD-10-CM | POA: Diagnosis not present

## 2018-08-17 DIAGNOSIS — J449 Chronic obstructive pulmonary disease, unspecified: Secondary | ICD-10-CM | POA: Diagnosis not present

## 2018-08-17 DIAGNOSIS — E039 Hypothyroidism, unspecified: Secondary | ICD-10-CM | POA: Diagnosis not present

## 2018-08-17 DIAGNOSIS — Z9181 History of falling: Secondary | ICD-10-CM | POA: Diagnosis not present

## 2018-08-17 DIAGNOSIS — Z87891 Personal history of nicotine dependence: Secondary | ICD-10-CM | POA: Diagnosis not present

## 2018-08-17 DIAGNOSIS — I25118 Atherosclerotic heart disease of native coronary artery with other forms of angina pectoris: Secondary | ICD-10-CM | POA: Diagnosis not present

## 2018-08-17 DIAGNOSIS — Z9989 Dependence on other enabling machines and devices: Secondary | ICD-10-CM | POA: Diagnosis not present

## 2018-08-17 DIAGNOSIS — Z794 Long term (current) use of insulin: Secondary | ICD-10-CM | POA: Diagnosis not present

## 2018-08-17 DIAGNOSIS — N183 Chronic kidney disease, stage 3 (moderate): Secondary | ICD-10-CM | POA: Diagnosis not present

## 2018-08-17 DIAGNOSIS — G4730 Sleep apnea, unspecified: Secondary | ICD-10-CM | POA: Diagnosis not present

## 2018-08-17 DIAGNOSIS — Z950 Presence of cardiac pacemaker: Secondary | ICD-10-CM | POA: Diagnosis not present

## 2018-08-19 ENCOUNTER — Other Ambulatory Visit: Payer: Self-pay | Admitting: Pulmonary Disease

## 2018-08-19 DIAGNOSIS — G4733 Obstructive sleep apnea (adult) (pediatric): Secondary | ICD-10-CM | POA: Diagnosis not present

## 2018-08-24 ENCOUNTER — Ambulatory Visit (INDEPENDENT_AMBULATORY_CARE_PROVIDER_SITE_OTHER): Payer: PPO | Admitting: *Deleted

## 2018-08-24 DIAGNOSIS — Z5181 Encounter for therapeutic drug level monitoring: Secondary | ICD-10-CM | POA: Diagnosis not present

## 2018-08-24 DIAGNOSIS — Z7901 Long term (current) use of anticoagulants: Secondary | ICD-10-CM | POA: Diagnosis not present

## 2018-08-24 DIAGNOSIS — I480 Paroxysmal atrial fibrillation: Secondary | ICD-10-CM | POA: Diagnosis not present

## 2018-08-24 DIAGNOSIS — I4819 Other persistent atrial fibrillation: Secondary | ICD-10-CM | POA: Diagnosis not present

## 2018-08-24 LAB — POCT INR: INR: 2.6 (ref 2.0–3.0)

## 2018-08-24 NOTE — Patient Instructions (Signed)
Description   Continue taking  1 tablet everyday except 1/2 tablet on Wednesdays and Saturdays.  Continue eating 2-3 servings of leafy green vegetable each week.  Recheck in 3 weeks.   Call [phone] with any new medications.     

## 2018-08-31 ENCOUNTER — Ambulatory Visit (INDEPENDENT_AMBULATORY_CARE_PROVIDER_SITE_OTHER): Payer: PPO

## 2018-08-31 DIAGNOSIS — D1801 Hemangioma of skin and subcutaneous tissue: Secondary | ICD-10-CM | POA: Diagnosis not present

## 2018-08-31 DIAGNOSIS — Z85828 Personal history of other malignant neoplasm of skin: Secondary | ICD-10-CM | POA: Diagnosis not present

## 2018-08-31 DIAGNOSIS — L814 Other melanin hyperpigmentation: Secondary | ICD-10-CM | POA: Diagnosis not present

## 2018-08-31 DIAGNOSIS — D225 Melanocytic nevi of trunk: Secondary | ICD-10-CM | POA: Diagnosis not present

## 2018-08-31 DIAGNOSIS — Z95810 Presence of automatic (implantable) cardiac defibrillator: Secondary | ICD-10-CM | POA: Diagnosis not present

## 2018-08-31 DIAGNOSIS — I5022 Chronic systolic (congestive) heart failure: Secondary | ICD-10-CM | POA: Diagnosis not present

## 2018-08-31 DIAGNOSIS — L821 Other seborrheic keratosis: Secondary | ICD-10-CM | POA: Diagnosis not present

## 2018-08-31 DIAGNOSIS — L570 Actinic keratosis: Secondary | ICD-10-CM | POA: Diagnosis not present

## 2018-08-31 NOTE — Progress Notes (Signed)
EPIC Encounter for ICM Monitoring  Patient Name: [name] is a 84 y.o. male Date: 08/31/2018 Primary Care Physican: [name], MD Primary [place] Electrophysiologist: Allred Dry Weight:Previous weight207lbs BiV Pacing: 91.2%  Clinical Status (31-Jul-2018 to 31-Aug-2018)  Treated VT/VF 0 episodes   AT/AF 1 episode   Time in AT/AF 2.3 hr/day (9.4%)   Longest AT/AF 3 days Observations (2) (31-Jul-2018 to 31-Aug-2018)  AT/AF >= 6 hr for 3 days.  Patient Activity less than 1 hr/day for 4 weeks.         Attempted call to patient and unable to reach.  Left message to return call.  Transmission reviewed.    Thoracic impedance abnormal suggesting fluid accumulation starting 08/25/2018.   Prescribed: Furosemide40 mgtake2tablets (80 mg total) twice a day.  Labs: 07/15/2018 Creatinine 1.04, BUN 19, Potassium 3.1, Sodium 137, EGFR >60 07/14/2018 Creatinine 1.16, BUN 20, Potassium 3.5, Sodium 137, EGFR 57->60 07/13/2018 Creatinine 1.25, BUN 17, Potassium 3.9, Sodium 137, EGFR 52->60 05/04/2018 Creatinine 1.76, BUN 26, Potassium 4.2, Sodium 139, EGFR 34-40 02/27/2018 Creatinine 1.23, BUN 16, Potassium 4.3, Sodium 139, EGFR 54-63, BNP 10,884 12/30/2017 Creatinine 1.24 BUN 15, Potassium 4.6, Sodium 139, EGFR 54-60  Recommendations: Unable to reach.  Follow-up plan: ICM clinic phone appointment on 09/14/2018 to recheck fluid levels.   Office appointment scheduled 10/02/2018 with [name].    Copy of ICM check sent to Dr. Rayann Heman and Dr Irish Lack for review and recommendations if needed.   AT/AF   3 month ICM trend: 08/31/2018    1 Year ICM trend:       Rosalene Billings, RN 08/31/2018 11:47 AM

## 2018-09-01 ENCOUNTER — Telehealth: Payer: Self-pay

## 2018-09-01 NOTE — Telephone Encounter (Signed)
Remote ICM transmission received.  Attempted call to wife regarding ICM remote transmission and left detailed message, to return call.

## 2018-09-04 NOTE — Progress Notes (Signed)
Wife returned call.  She said patient is feeling fine and does not have any leg swelling. Weight was up to 190 lbs this past week but has dropped to 187 lbs today. Advised to limit salt intake and will recheck transmission 09/14/2018.  Advised to call if he starts to experience any fluid accumulation symptoms.

## 2018-09-11 ENCOUNTER — Encounter: Payer: Self-pay | Admitting: Neurology

## 2018-09-11 DIAGNOSIS — R200 Anesthesia of skin: Secondary | ICD-10-CM | POA: Diagnosis not present

## 2018-09-11 DIAGNOSIS — Z794 Long term (current) use of insulin: Secondary | ICD-10-CM | POA: Diagnosis not present

## 2018-09-11 DIAGNOSIS — I739 Peripheral vascular disease, unspecified: Secondary | ICD-10-CM | POA: Diagnosis not present

## 2018-09-11 DIAGNOSIS — E1142 Type 2 diabetes mellitus with diabetic polyneuropathy: Secondary | ICD-10-CM | POA: Diagnosis not present

## 2018-09-11 DIAGNOSIS — E1151 Type 2 diabetes mellitus with diabetic peripheral angiopathy without gangrene: Secondary | ICD-10-CM | POA: Diagnosis not present

## 2018-09-11 DIAGNOSIS — I5022 Chronic systolic (congestive) heart failure: Secondary | ICD-10-CM | POA: Diagnosis not present

## 2018-09-11 DIAGNOSIS — J449 Chronic obstructive pulmonary disease, unspecified: Secondary | ICD-10-CM | POA: Diagnosis not present

## 2018-09-11 DIAGNOSIS — N183 Chronic kidney disease, stage 3 (moderate): Secondary | ICD-10-CM | POA: Diagnosis not present

## 2018-09-11 DIAGNOSIS — I129 Hypertensive chronic kidney disease with stage 1 through stage 4 chronic kidney disease, or unspecified chronic kidney disease: Secondary | ICD-10-CM | POA: Diagnosis not present

## 2018-09-11 DIAGNOSIS — E1122 Type 2 diabetes mellitus with diabetic chronic kidney disease: Secondary | ICD-10-CM | POA: Diagnosis not present

## 2018-09-12 ENCOUNTER — Other Ambulatory Visit: Payer: Self-pay | Admitting: *Deleted

## 2018-09-12 DIAGNOSIS — R202 Paresthesia of skin: Principal | ICD-10-CM

## 2018-09-12 DIAGNOSIS — R200 Anesthesia of skin: Secondary | ICD-10-CM

## 2018-09-14 ENCOUNTER — Ambulatory Visit: Payer: PPO | Admitting: *Deleted

## 2018-09-14 ENCOUNTER — Ambulatory Visit (INDEPENDENT_AMBULATORY_CARE_PROVIDER_SITE_OTHER): Payer: PPO

## 2018-09-14 DIAGNOSIS — I4819 Other persistent atrial fibrillation: Secondary | ICD-10-CM | POA: Diagnosis not present

## 2018-09-14 DIAGNOSIS — Z5181 Encounter for therapeutic drug level monitoring: Secondary | ICD-10-CM | POA: Diagnosis not present

## 2018-09-14 DIAGNOSIS — I480 Paroxysmal atrial fibrillation: Secondary | ICD-10-CM

## 2018-09-14 DIAGNOSIS — Z95810 Presence of automatic (implantable) cardiac defibrillator: Secondary | ICD-10-CM

## 2018-09-14 DIAGNOSIS — Z7901 Long term (current) use of anticoagulants: Secondary | ICD-10-CM | POA: Diagnosis not present

## 2018-09-14 DIAGNOSIS — I5022 Chronic systolic (congestive) heart failure: Secondary | ICD-10-CM

## 2018-09-14 LAB — POCT INR: INR: 2.3 (ref 2.0–3.0)

## 2018-09-14 NOTE — Patient Instructions (Addendum)
Description   Continue taking  1 tablet everyday except 1/2 tablet on Wednesdays and Saturdays.  Continue eating 2-3 servings of leafy green vegetable each week.  Recheck in 3 weeks.   Call [phone] with any new medications.

## 2018-09-15 ENCOUNTER — Telehealth: Payer: Self-pay

## 2018-09-15 MED ORDER — FUROSEMIDE 40 MG PO TABS: 80.0000 mg | ORAL_TABLET | Freq: Two times a day (BID) | ORAL | 1 refills | Status: DC

## 2018-09-15 NOTE — Progress Notes (Signed)
EPIC Encounter for ICM Monitoring  Patient Name: [name] is a 84 y.o. male Date: 09/15/2018 Primary Care Physican: [name], MD Primary [place] Electrophysiologist: Allred BiV Pacing: 99.5%  Last Weight: 187 lbs Today's Weight:  unknown       Heart Failure questions reviewed, pt asymptomatic.   Thoracic impedance abnormal suggesting fluid accumulation from 09/03/2018 - and almost at baseline 09/14/2018.   Prescribed: Furosemide40 mgtake2tablets (80 mg total)twice a day.  Wife reports Dr Laurann Montana instructed him to take 40 mg 1 tablet bid.   Labs: 07/15/2018 Creatinine 1.04, BUN 19, Potassium 3.1, Sodium 137, EGFR >60 07/14/2018 Creatinine 1.16, BUN 20, Potassium 3.5, Sodium 137, EGFR 57->60 07/13/2018 Creatinine 1.25, BUN 17, Potassium 3.9, Sodium 137, EGFR 52->60 05/04/2018 Creatinine 1.76, BUN 26, Potassium 4.2, Sodium 139, EGFR 34-40 02/27/2018 Creatinine 1.23, BUN 16, Potassium 4.3, Sodium 139, EGFR 54-63, BNP 10,884 12/30/2017 Creatinine 1.24 BUN 15, Potassium 4.6, Sodium 139, EGFR 54-60  Recommendations: No changes.  Encouraged to call for fluid symptoms.  Follow-up plan: ICM clinic phone appointment on 10/12/2018.   Office appointment scheduled 10/02/2018 with [name].    Copy of ICM check sent to Dr. Rayann Heman.   3 month ICM trend: 09/14/2018    1 Year ICM trend:       Rosalene Billings, RN 09/15/2018 9:00 AM

## 2018-09-15 NOTE — Telephone Encounter (Signed)
Called and spoke to the wife regarding message below. Wife states that she was glad that I called back because she was mistaken earlier. She states that the patient has been taking lasix 80 mg BID (which is consostent with last OV on 8/29). According to [facility] RN, this dose does seem to be effective for the patient. Rx sent to preferred pharmacy for 30 days supply with one refill per wife request. Patient has an appointment with [name] on 12/9 and we will check a BMET at that time.

## 2018-09-15 NOTE — Telephone Encounter (Signed)
Spoke with wife.  During ICM call she requested refill of Furosemide because he is out of the medication.  She explained he has been taking Furosemide 40 mg 1 tablet twice a day which was instructions given by PCP Dr Laurann Montana at his last visit and this dosage seems to be effective for him.  Explained furosemide instructions are different in Epic.  Advised will send to refill department so they can verify with Dr Irish Lack the dosage needed.  She would like a 30 day supply sent to [facility] on [address]. She does not want a 90 day  Supply because they plan on changing pharmacies.  Patient has appt with Dr Irish Lack 10/02/2018.

## 2018-09-15 NOTE — Telephone Encounter (Signed)
Please advise on msg in regards to furosemide. Thanks, MI

## 2018-09-17 ENCOUNTER — Inpatient Hospital Stay (HOSPITAL_COMMUNITY)
Admission: EM | Admit: 2018-09-17 | Discharge: 2018-09-20 | DRG: 194 | Disposition: A | Discharge status: Home / Self Care | Payer: PPO | Attending: Internal Medicine | Admitting: Internal Medicine

## 2018-09-17 ENCOUNTER — Emergency Department (HOSPITAL_COMMUNITY): Payer: PPO

## 2018-09-17 ENCOUNTER — Encounter (HOSPITAL_COMMUNITY): Payer: Self-pay | Admitting: Emergency Medicine

## 2018-09-17 ENCOUNTER — Other Ambulatory Visit: Payer: Self-pay

## 2018-09-17 DIAGNOSIS — E1151 Type 2 diabetes mellitus with diabetic peripheral angiopathy without gangrene: Secondary | ICD-10-CM | POA: Diagnosis present

## 2018-09-17 DIAGNOSIS — J189 Pneumonia, unspecified organism: Principal | ICD-10-CM | POA: Diagnosis present

## 2018-09-17 DIAGNOSIS — J449 Chronic obstructive pulmonary disease, unspecified: Secondary | ICD-10-CM | POA: Diagnosis not present

## 2018-09-17 DIAGNOSIS — I4819 Other persistent atrial fibrillation: Secondary | ICD-10-CM | POA: Diagnosis present

## 2018-09-17 DIAGNOSIS — Z8673 Personal history of transient ischemic attack (TIA), and cerebral infarction without residual deficits: Secondary | ICD-10-CM

## 2018-09-17 DIAGNOSIS — I480 Paroxysmal atrial fibrillation: Secondary | ICD-10-CM | POA: Diagnosis not present

## 2018-09-17 DIAGNOSIS — Z87891 Personal history of nicotine dependence: Secondary | ICD-10-CM | POA: Diagnosis not present

## 2018-09-17 DIAGNOSIS — I2510 Atherosclerotic heart disease of native coronary artery without angina pectoris: Secondary | ICD-10-CM | POA: Diagnosis not present

## 2018-09-17 DIAGNOSIS — Z7951 Long term (current) use of inhaled steroids: Secondary | ICD-10-CM | POA: Diagnosis not present

## 2018-09-17 DIAGNOSIS — E1122 Type 2 diabetes mellitus with diabetic chronic kidney disease: Secondary | ICD-10-CM | POA: Diagnosis present

## 2018-09-17 DIAGNOSIS — I4891 Unspecified atrial fibrillation: Secondary | ICD-10-CM | POA: Diagnosis not present

## 2018-09-17 DIAGNOSIS — Z79899 Other long term (current) drug therapy: Secondary | ICD-10-CM | POA: Diagnosis not present

## 2018-09-17 DIAGNOSIS — N183 Chronic kidney disease, stage 3 (moderate): Secondary | ICD-10-CM | POA: Diagnosis present

## 2018-09-17 DIAGNOSIS — R0902 Hypoxemia: Secondary | ICD-10-CM | POA: Diagnosis not present

## 2018-09-17 DIAGNOSIS — E119 Type 2 diabetes mellitus without complications: Secondary | ICD-10-CM | POA: Diagnosis not present

## 2018-09-17 DIAGNOSIS — R911 Solitary pulmonary nodule: Secondary | ICD-10-CM

## 2018-09-17 DIAGNOSIS — Z951 Presence of aortocoronary bypass graft: Secondary | ICD-10-CM | POA: Diagnosis not present

## 2018-09-17 DIAGNOSIS — Z794 Long term (current) use of insulin: Secondary | ICD-10-CM

## 2018-09-17 DIAGNOSIS — E039 Hypothyroidism, unspecified: Secondary | ICD-10-CM | POA: Diagnosis present

## 2018-09-17 DIAGNOSIS — I5022 Chronic systolic (congestive) heart failure: Secondary | ICD-10-CM | POA: Diagnosis not present

## 2018-09-17 DIAGNOSIS — I472 Ventricular tachycardia: Secondary | ICD-10-CM | POA: Diagnosis not present

## 2018-09-17 DIAGNOSIS — I252 Old myocardial infarction: Secondary | ICD-10-CM

## 2018-09-17 DIAGNOSIS — Z7901 Long term (current) use of anticoagulants: Secondary | ICD-10-CM | POA: Diagnosis not present

## 2018-09-17 DIAGNOSIS — R42 Dizziness and giddiness: Secondary | ICD-10-CM | POA: Diagnosis not present

## 2018-09-17 DIAGNOSIS — R05 Cough: Secondary | ICD-10-CM | POA: Diagnosis not present

## 2018-09-17 DIAGNOSIS — J181 Lobar pneumonia, unspecified organism: Secondary | ICD-10-CM | POA: Diagnosis not present

## 2018-09-17 DIAGNOSIS — Y95 Nosocomial condition: Secondary | ICD-10-CM | POA: Diagnosis present

## 2018-09-17 DIAGNOSIS — I272 Pulmonary hypertension, unspecified: Secondary | ICD-10-CM | POA: Diagnosis present

## 2018-09-17 DIAGNOSIS — I130 Hypertensive heart and chronic kidney disease with heart failure and stage 1 through stage 4 chronic kidney disease, or unspecified chronic kidney disease: Secondary | ICD-10-CM | POA: Diagnosis present

## 2018-09-17 DIAGNOSIS — J440 Chronic obstructive pulmonary disease with acute lower respiratory infection: Secondary | ICD-10-CM | POA: Diagnosis present

## 2018-09-17 DIAGNOSIS — N179 Acute kidney failure, unspecified: Secondary | ICD-10-CM | POA: Diagnosis present

## 2018-09-17 DIAGNOSIS — R110 Nausea: Secondary | ICD-10-CM | POA: Diagnosis not present

## 2018-09-17 DIAGNOSIS — R0602 Shortness of breath: Secondary | ICD-10-CM | POA: Diagnosis not present

## 2018-09-17 DIAGNOSIS — E785 Hyperlipidemia, unspecified: Secondary | ICD-10-CM | POA: Diagnosis present

## 2018-09-17 DIAGNOSIS — Z79890 Hormone replacement therapy (postmenopausal): Secondary | ICD-10-CM

## 2018-09-17 DIAGNOSIS — I420 Dilated cardiomyopathy: Secondary | ICD-10-CM | POA: Diagnosis present

## 2018-09-17 DIAGNOSIS — J439 Emphysema, unspecified: Secondary | ICD-10-CM

## 2018-09-17 DIAGNOSIS — Z8249 Family history of ischemic heart disease and other diseases of the circulatory system: Secondary | ICD-10-CM | POA: Diagnosis not present

## 2018-09-17 DIAGNOSIS — I25118 Atherosclerotic heart disease of native coronary artery with other forms of angina pectoris: Secondary | ICD-10-CM | POA: Diagnosis not present

## 2018-09-17 DIAGNOSIS — I482 Chronic atrial fibrillation, unspecified: Secondary | ICD-10-CM | POA: Diagnosis not present

## 2018-09-17 DIAGNOSIS — R059 Cough, unspecified: Secondary | ICD-10-CM

## 2018-09-17 DIAGNOSIS — Z95810 Presence of automatic (implantable) cardiac defibrillator: Secondary | ICD-10-CM

## 2018-09-17 DIAGNOSIS — J9811 Atelectasis: Secondary | ICD-10-CM | POA: Diagnosis not present

## 2018-09-17 DIAGNOSIS — I959 Hypotension, unspecified: Secondary | ICD-10-CM | POA: Diagnosis present

## 2018-09-17 DIAGNOSIS — N400 Enlarged prostate without lower urinary tract symptoms: Secondary | ICD-10-CM | POA: Diagnosis present

## 2018-09-17 DIAGNOSIS — R531 Weakness: Secondary | ICD-10-CM | POA: Diagnosis not present

## 2018-09-17 LAB — LIPASE, BLOOD: Lipase: 26 U/L (ref 11–51)

## 2018-09-17 LAB — CBC WITH DIFFERENTIAL/PLATELET
Abs Immature Granulocytes: 0.06 10*3/uL (ref 0.00–0.07)
Basophils Absolute: 0 10*3/uL (ref 0.0–0.1)
Basophils Relative: 0 %
Eosinophils Absolute: 0.4 10*3/uL (ref 0.0–0.5)
Eosinophils Relative: 2 %
HCT: 41.2 % (ref 39.0–52.0)
Hemoglobin: 12.8 g/dL — ABNORMAL LOW (ref 13.0–17.0)
Immature Granulocytes: 0 %
Lymphocytes Relative: 8 %
Lymphs Abs: 1.3 10*3/uL (ref 0.7–4.0)
MCH: 30 pg (ref 26.0–34.0)
MCHC: 31.1 g/dL (ref 30.0–36.0)
MCV: 96.7 fL (ref 80.0–100.0)
Monocytes Absolute: 1.1 10*3/uL — ABNORMAL HIGH (ref 0.1–1.0)
Monocytes Relative: 7 %
Neutro Abs: 12.9 10*3/uL — ABNORMAL HIGH (ref 1.7–7.7)
Neutrophils Relative %: 83 %
Platelets: 197 10*3/uL (ref 150–400)
RBC: 4.26 MIL/uL (ref 4.22–5.81)
RDW: 14 % (ref 11.5–15.5)
WBC: 15.7 10*3/uL — ABNORMAL HIGH (ref 4.0–10.5)
nRBC: 0 % (ref 0.0–0.2)

## 2018-09-17 LAB — COMPREHENSIVE METABOLIC PANEL
ALT: 10 U/L (ref 0–44)
AST: 18 U/L (ref 15–41)
Albumin: 3.3 g/dL — ABNORMAL LOW (ref 3.5–5.0)
Alkaline Phosphatase: 59 U/L (ref 38–126)
Anion gap: 8 (ref 5–15)
BUN: 14 mg/dL (ref 8–23)
CO2: 25 mmol/L (ref 22–32)
Calcium: 8.7 mg/dL — ABNORMAL LOW (ref 8.9–10.3)
Chloride: 102 mmol/L (ref 98–111)
Creatinine, Ser: 1.26 mg/dL — ABNORMAL HIGH (ref 0.61–1.24)
GFR calc Af Amer: 60 mL/min — ABNORMAL LOW (ref >60)
GFR calc non Af Amer: 51 mL/min — ABNORMAL LOW (ref >60)
Glucose, Bld: 109 mg/dL — ABNORMAL HIGH (ref 70–99)
Potassium: 3.8 mmol/L (ref 3.5–5.1)
Sodium: 135 mmol/L (ref 135–145)
Total Bilirubin: 0.8 mg/dL (ref 0.3–1.2)
Total Protein: 6.2 g/dL — ABNORMAL LOW (ref 6.5–8.1)

## 2018-09-17 LAB — URINALYSIS, ROUTINE W REFLEX MICROSCOPIC
Bilirubin Urine: NEGATIVE
Glucose, UA: NEGATIVE mg/dL
Hgb urine dipstick: NEGATIVE
Ketones, ur: NEGATIVE mg/dL
Leukocytes, UA: NEGATIVE
Nitrite: NEGATIVE
Protein, ur: NEGATIVE mg/dL
Specific Gravity, Urine: 1.006 (ref 1.005–1.030)
pH: 5 (ref 5.0–8.0)

## 2018-09-17 LAB — I-STAT TROPONIN, ED: Troponin i, poc: 0 ng/mL (ref 0.00–0.08)

## 2018-09-17 LAB — GLUCOSE, CAPILLARY
Glucose-Capillary: 171 mg/dL — ABNORMAL HIGH (ref 70–99)
Glucose-Capillary: 96 mg/dL (ref 70–99)

## 2018-09-17 LAB — PROTIME-INR
INR: 2.69
Prothrombin Time: 28.2 seconds — ABNORMAL HIGH (ref 11.4–15.2)

## 2018-09-17 LAB — I-STAT CG4 LACTIC ACID, ED: Lactic Acid, Venous: 1.46 mmol/L (ref 0.5–1.9)

## 2018-09-17 LAB — BRAIN NATRIURETIC PEPTIDE: B Natriuretic Peptide: 419.7 pg/mL — ABNORMAL HIGH (ref 0.0–100.0)

## 2018-09-17 MED ORDER — LEVOTHYROXINE SODIUM 100 MCG PO TABS
100.0000 ug | ORAL_TABLET | Freq: Every day | ORAL | Status: DC
  Administered 2018-09-18 – 2018-09-20 (×3): 100 ug via ORAL
  Filled 2018-09-17 (×3): qty 1

## 2018-09-17 MED ORDER — INSULIN ASPART 100 UNIT/ML ~~LOC~~ SOLN
0.0000 [IU] | Freq: Three times a day (TID) | SUBCUTANEOUS | Status: DC
  Administered 2018-09-17: 2 [IU] via SUBCUTANEOUS

## 2018-09-17 MED ORDER — WARFARIN - PHARMACIST DOSING INPATIENT
Freq: Every day | Status: DC
  Administered 2018-09-17 – 2018-09-19 (×3)

## 2018-09-17 MED ORDER — BISACODYL 5 MG PO TBEC
10.0000 mg | DELAYED_RELEASE_TABLET | Freq: Every day | ORAL | Status: DC
  Administered 2018-09-18: 10 mg via ORAL
  Filled 2018-09-17 (×2): qty 2

## 2018-09-17 MED ORDER — INSULIN GLARGINE 100 UNIT/ML ~~LOC~~ SOLN
15.0000 [IU] | Freq: Every day | SUBCUTANEOUS | Status: DC
  Administered 2018-09-17 – 2018-09-19 (×3): 15 [IU] via SUBCUTANEOUS
  Filled 2018-09-17 (×3): qty 0.15

## 2018-09-17 MED ORDER — FLUTICASONE PROPIONATE 50 MCG/ACT NA SUSP
1.0000 | Freq: Every day | NASAL | Status: DC
  Administered 2018-09-18 – 2018-09-20 (×3): 1 via NASAL
  Filled 2018-09-17: qty 16

## 2018-09-17 MED ORDER — TIOTROPIUM BROMIDE MONOHYDRATE 2.5 MCG/ACT IN AERS: 2.0000 | INHALATION_SPRAY | Freq: Every day | RESPIRATORY_TRACT | Status: DC

## 2018-09-17 MED ORDER — SENNA 8.6 MG PO TABS
2.0000 | ORAL_TABLET | Freq: Every day | ORAL | Status: DC
  Administered 2018-09-18: 17.2 mg via ORAL
  Filled 2018-09-17 (×2): qty 2

## 2018-09-17 MED ORDER — INSULIN ASPART 100 UNIT/ML ~~LOC~~ SOLN: 0.0000 [IU] | Freq: Every day | SUBCUTANEOUS | Status: DC

## 2018-09-17 MED ORDER — VANCOMYCIN HCL 10 G IV SOLR
1500.0000 mg | Freq: Once | INTRAVENOUS | Status: AC
  Administered 2018-09-17: 1500 mg via INTRAVENOUS
  Filled 2018-09-17: qty 1500

## 2018-09-17 MED ORDER — PIPERACILLIN-TAZOBACTAM 3.375 G IVPB
3.3750 g | Freq: Three times a day (TID) | INTRAVENOUS | Status: DC
  Administered 2018-09-17 – 2018-09-18 (×3): 3.375 g via INTRAVENOUS
  Filled 2018-09-17 (×4): qty 50

## 2018-09-17 MED ORDER — SODIUM CHLORIDE 0.9 % IV SOLN
2.0000 g | Freq: Once | INTRAVENOUS | Status: AC
  Administered 2018-09-17: 2 g via INTRAVENOUS
  Filled 2018-09-17: qty 2

## 2018-09-17 MED ORDER — ALBUTEROL SULFATE (2.5 MG/3ML) 0.083% IN NEBU: 2.5000 mg | INHALATION_SOLUTION | Freq: Four times a day (QID) | RESPIRATORY_TRACT | Status: DC | PRN

## 2018-09-17 MED ORDER — DM-GUAIFENESIN ER 30-600 MG PO TB12: 1.0000 | ORAL_TABLET | Freq: Two times a day (BID) | ORAL | Status: DC | PRN

## 2018-09-17 MED ORDER — WARFARIN SODIUM 5 MG PO TABS
5.0000 mg | ORAL_TABLET | Freq: Once | ORAL | Status: AC
  Administered 2018-09-17: 5 mg via ORAL
  Filled 2018-09-17 (×2): qty 1

## 2018-09-17 MED ORDER — SODIUM CHLORIDE 0.9 % IV BOLUS
500.0000 mL | Freq: Once | INTRAVENOUS | Status: AC
  Administered 2018-09-17: 500 mL via INTRAVENOUS

## 2018-09-17 MED ORDER — ATORVASTATIN CALCIUM 40 MG PO TABS
40.0000 mg | ORAL_TABLET | Freq: Every day | ORAL | Status: DC
  Administered 2018-09-18 – 2018-09-19 (×2): 40 mg via ORAL
  Filled 2018-09-17 (×2): qty 1

## 2018-09-17 MED ORDER — IOHEXOL 300 MG/ML  SOLN
75.0000 mL | Freq: Once | INTRAMUSCULAR | Status: AC | PRN
  Administered 2018-09-17: 75 mL via INTRAVENOUS

## 2018-09-17 MED ORDER — EZETIMIBE 10 MG PO TABS
10.0000 mg | ORAL_TABLET | Freq: Every day | ORAL | Status: DC
  Administered 2018-09-18 – 2018-09-20 (×3): 10 mg via ORAL
  Filled 2018-09-17 (×3): qty 1

## 2018-09-17 MED ORDER — ALBUTEROL SULFATE HFA 108 (90 BASE) MCG/ACT IN AERS: 2.0000 | INHALATION_SPRAY | Freq: Four times a day (QID) | RESPIRATORY_TRACT | Status: DC | PRN

## 2018-09-17 MED ORDER — SODIUM CHLORIDE 0.9 % IV SOLN
INTRAVENOUS | Status: DC
  Administered 2018-09-18: via INTRAVENOUS

## 2018-09-17 MED ORDER — FERROUS SULFATE 325 (65 FE) MG PO TABS
325.0000 mg | ORAL_TABLET | Freq: Every evening | ORAL | Status: DC
  Administered 2018-09-17 – 2018-09-19 (×3): 325 mg via ORAL
  Filled 2018-09-17 (×3): qty 1

## 2018-09-17 MED ORDER — AMIODARONE HCL 200 MG PO TABS
200.0000 mg | ORAL_TABLET | Freq: Every day | ORAL | Status: DC
  Administered 2018-09-18 – 2018-09-20 (×3): 200 mg via ORAL
  Filled 2018-09-17 (×3): qty 1

## 2018-09-17 MED ORDER — VANCOMYCIN HCL IN DEXTROSE 750-5 MG/150ML-% IV SOLN
750.0000 mg | Freq: Two times a day (BID) | INTRAVENOUS | Status: DC
  Administered 2018-09-18 (×2): 750 mg via INTRAVENOUS
  Filled 2018-09-17 (×2): qty 150

## 2018-09-17 MED ORDER — TRAMADOL HCL 50 MG PO TABS: 50.0000 mg | ORAL_TABLET | Freq: Three times a day (TID) | ORAL | Status: DC | PRN

## 2018-09-17 MED ORDER — PIPERACILLIN-TAZOBACTAM 3.375 G IVPB: 3.3750 g | Freq: Three times a day (TID) | INTRAVENOUS | Status: DC

## 2018-09-17 NOTE — Progress Notes (Signed)
ANTICOAGULATION CONSULT NOTE - Initial Consult  Pharmacy Consult for warfarin Indication: atrial fibrillation  Allergies  Allergen Reactions  . Aldactone [Spironolactone] Other (See Comments)    Hyperkalemia  reported by Dr. [name] - pt is currently taking 12.5 mg daily -November 2017 per medication list by same MD    Patient Measurements: Height: 5\' 8"  (172.7 cm) Weight: 189 lb (85.7 kg) IBW/kg (Calculated) : 68.4  Vital Signs: Temp: 98.3 F (36.8 C) (11/24 0738) Temp Source: Oral (11/24 0738) BP: 101/57 (11/24 1200) Pulse Rate: 82 (11/24 1200)  Labs: Recent Labs    09/17/18 0850  HGB 12.8*  HCT 41.2  PLT 197  LABPROT 28.2*  INR 2.69  CREATININE 1.26*    Estimated Creatinine Clearance: 48.1 mL/min (A) (by C-G formula based on SCr of 1.26 mg/dL (H)).   Medical History: Past Medical History:  Diagnosis Date  . Atrial fibrillation ([place])    persistent, previously seen at [facility] and placed on amiodarone  . Benign prostatic hypertrophy   . CAD (coronary artery disease)    multivessel s/p inferolateral wall MI with subsequent CABG 11/1998.  Cath 2009 with Patent grafts  . Cleft palate   . COPD with emphysema ([place]) 04/01/2010  . DM (diabetes mellitus), type 2 ([place])   . Dyspnea   . GERD (gastroesophageal reflux disease)   . HTN (hypertension)   . Hyperlipidemia   . Hypothyroidism   . Iron deficiency anemia   . Ischemic dilated cardiomyopathy ([place])    EF 35-40% by MUGA 6/11  . Myocardial infarction ([place])   . Nasal septal deviation   . Nephrolithiasis   . OSA (obstructive sleep apnea)   . PAF (paroxysmal atrial fibrillation) ([place])   . Peripheral arterial disease ([facility])    left subclavian artery stenosis  . PNA (pneumonia)   . Psoriasis   . Seborrheic keratosis   . Stroke ([place])   . Systolic congestive heart failure ([place]) 2009   s/p BiV ICD implantation by Dr Leonia Reeves (MDT)    Medications:  Scheduled:   Assessment: 84 YOM on warfarin PTA for afib, INR  therapeutic on admission at 2.69, CBC wnl.  Last dose 11/23. PTA dosing: 2.5mg  Wed and Sat, 5mg  all other days  Goal of Therapy:  INR 2-3 Monitor platelets by anticoagulation protocol: Yes   Plan:  Warfarin 5mg  PO x 1 tonight Daily INR, CBC, s/s/ bleeding  Bertis Ruddy, PharmD Clinical Pharmacist Please check AMION for all [place] numbers 09/17/2018 2:01 PM

## 2018-09-17 NOTE — ED Provider Notes (Signed)
[facility] EMERGENCY DEPARTMENT Provider Note   CSN: [id number] Arrival date & time: 09/17/18  2831     History   Chief Complaint No chief complaint on file.   HPI [name] is a 84 y.o. male.  Patient presenting with some vague symptoms.  Patient has a known history of some heart disease and atrial fibrillation.  Patient is on Lasix patient is on the blood thinner Xarelto.  Patient is on amiodarone.  Patient is on Coreg.  Patient also has a history of COPD.  His previous smoker stopped smoking in 2000.  Patient also has a history of diabetes.  Past medical history is significant for hypertension diabetes hyperlipidemia systolic congestive heart failure atrial fibrillation.  And as stated patient has a pacemaker defibrillator however the defibrillator part is been turned off.  Has a history of ischemic dilated cardiomyopathy.  Patient was seen in the emergency department in September 19 for fatigue and hypotension.  Diagnosed with community-acquired pneumonia of the right lung with an admission.  Patient states he had been doing fairly well since that admission until Friday.  He started to develop dizziness some weakness felt as if he was going to pass out.  Did have some vomiting.  No diarrhea.  Blood pressures have been marginal systolic around 95.  The vomiting occurred on Saturday there has not been any today.  Denies any chest pain always has some baseline shortness of breath.  Not certain if he had any fevers.     Past Medical History:  Diagnosis Date  . Atrial fibrillation ([place])    persistent, previously seen at [facility] and placed on amiodarone  . Benign prostatic hypertrophy   . CAD (coronary artery disease)    multivessel s/p inferolateral wall MI with subsequent CABG 11/1998.  Cath 2009 with Patent grafts  . Cleft palate   . COPD with emphysema ([place]) 04/01/2010  . DM (diabetes mellitus), type 2 ([place])   . Dyspnea   . GERD (gastroesophageal reflux  disease)   . HTN (hypertension)   . Hyperlipidemia   . Hypothyroidism   . Iron deficiency anemia   . Ischemic dilated cardiomyopathy ([place])    EF 35-40% by MUGA 6/11  . Myocardial infarction ([place])   . Nasal septal deviation   . Nephrolithiasis   . OSA (obstructive sleep apnea)   . PAF (paroxysmal atrial fibrillation) ([place])   . Peripheral arterial disease ([facility])    left subclavian artery stenosis  . PNA (pneumonia)   . Psoriasis   . Seborrheic keratosis   . Stroke ([place])   . Systolic congestive heart failure ([place]) 2009   s/p BiV ICD implantation by Dr Leonia Reeves (MDT)    Patient Active Problem List   Diagnosis Date Noted  . Sepsis due to pneumonia ([place]) 07/13/2018  . Supratherapeutic INR 07/13/2018  . Hypoxia 07/13/2018  . Hypothyroidism 07/13/2018  . Pressure injury of skin 07/13/2018  . Community acquired pneumonia 09/03/2016  . Acute respiratory failure ([place]) 09/03/2016  . SOB (shortness of breath)   . A-fib ([place]) 06/11/2016  . Atrial fibrillation, persistent   . Allergic rhinitis 02/05/2016  . Long term (current) use of anticoagulants [Z79.01] 01/19/2016  . Chronic systolic heart failure ([place])   . Acute respiratory failure with hypoxia ([place]) 01/11/2016  . Acute on chronic systolic CHF (congestive heart failure), NYHA class 3 ([place]) 01/11/2016  . Demand ischemia ([place]) 01/11/2016  . Acute renal failure superimposed on stage 3 chronic kidney disease ([place]) 01/11/2016  .  Diabetes mellitus with diabetic neuropathy, with long-term current use of insulin ([place]) 01/11/2016  . Morbid obesity due to excess calories ([place]) 01/11/2016  . COPD (chronic obstructive pulmonary disease) ([place]) 01/10/2016  . Coronary artery disease   . Ischemic dilated cardiomyopathy ([place])   . PAF (paroxysmal atrial fibrillation) ([place])   . OSA (obstructive sleep apnea)   . Peripheral vascular disease ([place]) 10/02/2013  . Long term current use of anticoagulant therapy 08/21/2013  . Essential hypertension  04/01/2010  . Automatic implantable cardioverter-defibrillator in situ 03/31/2010    Past Surgical History:  Procedure Laterality Date  . BI-VENTRICULAR IMPLANTABLE CARDIOVERTER DEFIBRILLATOR  (CRT-D)  10-08-08; 11-06-2013   Dr Leonia Reeves (MDT) implant for primary prevention; gen change to MDT VivaXT CRTD by Dr Rayann Heman  . BIV ICD GENERTAOR CHANGE OUT N/A 11/06/2013   Procedure: BIV ICD GENERTAOR CHANGE OUT;  Surgeon: Coralyn Mark, MD;  Location: [facility] CATH LAB;  Service: Cardiovascular;  Laterality: N/A;  . c-spine surgery    . CARDIOVERSION N/A 04/29/2016   Procedure: CARDIOVERSION;  Surgeon: Pixie Casino, MD;  Location[facility] ENDOSCOPY;  Service: Cardiovascular;  Laterality: N/A;  . CARPAL TUNNEL RELEASE    . CATARACT EXTRACTION    . CORONARY ARTERY BYPASS GRAFT     LIMA to LAD, SVG to OM, SVG to diagonal  . ELECTROPHYSIOLOGIC STUDY N/A 06/11/2016   Procedure: Atrial Fibrillation Ablation;  Surgeon: Thompson Grayer, MD;  Location: [facility] CV LAB;  Service: Cardiovascular;  Laterality: N/A;  . left cleft palate and left cleft lip repair          Home Medications    Prior to Admission medications   Medication Sig Start Date End Date Taking? Authorizing Provider  albuterol (PROVENTIL HFA;VENTOLIN HFA) 108 (90 BASE) MCG/ACT inhaler Inhale 2 puffs into the lungs every 6 (six) hours as needed for wheezing or shortness of breath. Must keep June 2016 appt. 03/04/15   Chesley Mires, MD  albuterol (PROVENTIL) (2.5 MG/3ML) 0.083% nebulizer solution USE 1 VIAL VIA NEBULIZER 3 TIMES A DAY AS DIRECTED. DX:J44.9 08/21/18   Chesley Mires, MD  amiodarone (PACERONE) 400 MG tablet TAKE HALF TABLET (200 MG TOTAL) BY MOUTH DAILY. 02/21/18   Allred, Jeneen Rinks, MD  atorvastatin (LIPITOR) 80 MG tablet TAKE 1 TABLET (80 MG TOTAL) BY MOUTH DAILY. 07/10/18   Jettie Booze, MD  azithromycin (ZITHROMAX) 500 MG tablet Take 1 tablet (500 mg total) by mouth daily. 07/15/18   Thurnell Lose, MD  bisacodyl (DULCOLAX) 5 MG  EC tablet Take 2 tablets (10 mg total) by mouth daily at 12 noon. 09/06/16   Regalado, Belkys A, MD  carvedilol (COREG) 6.25 MG tablet TAKE 1 TABLET BY MOUTH TWICE A DAY 05/02/18   Jettie Booze, MD  cholecalciferol (VITAMIN D) 1000 units tablet Take 2,000 Units by mouth every evening.    [provider]  dextromethorphan-guaiFENesin (MUCINEX DM) 30-600 MG 12hr tablet Take 1 tablet by mouth 2 (two) times daily as needed for cough.    [provider]  ezetimibe (ZETIA) 10 MG tablet Take 10 mg by mouth daily.    [provider]  ferrous sulfate 325 (65 FE) MG tablet Take 325 mg by mouth every evening.     [provider]  fluticasone (FLONASE) 50 MCG/ACT nasal spray SPRAY 2 SPRAYS INTO EACH NOSTRIL EVERY DAY 08/16/18   Chesley Mires, MD  furosemide (LASIX) 40 MG tablet Take 2 tablets (80 mg total) by mouth 2 (two) times daily. 09/15/18  Jettie Booze, MD  insulin aspart (NOVOLOG FLEXPEN) 100 UNIT/ML FlexPen Inject 4 Units into the skin 3 (three) times daily before meals.     [provider]  insulin degludec (TRESIBA FLEXTOUCH) 100 UNIT/ML SOPN FlexTouch Pen Inject 0.3 mLs (30 Units total) into the skin daily before lunch. Patient taking differently: Inject 18 Units into the skin daily before lunch.  09/06/16   Regalado, Belkys A, MD  levothyroxine (SYNTHROID, LEVOTHROID) 100 MCG tablet Take 100 mcg by mouth daily before breakfast.  09/23/15   [provider]  Multiple Vitamins-Minerals (PRESERVISION AREDS PO) Take 1 tablet by mouth 2 (two) times daily.    [provider]  potassium chloride 20 MEQ TBCR Take 20 mEq by mouth daily. 07/15/18   Thurnell Lose, MD  PRESCRIPTION MEDICATION Inhale into the lungs at bedtime. CPAP    [provider]  senna (SENOKOT) 8.6 MG tablet Take 2 tablets by mouth at bedtime.     [provider]  SPIRIVA RESPIMAT 2.5 MCG/ACT AERS INHALE 2 PUFFS INTO THE LUNGS DAILY. 04/11/17    Chesley Mires, MD  spironolactone (ALDACTONE) 25 MG tablet TAKE 1/2 TABLET (12.5 MG TOTAL) BY MOUTH DAILY. 06/01/18   Jettie Booze, MD  traMADol (ULTRAM) 50 MG tablet Take 50 mg by mouth 3 (three) times daily as needed for moderate pain.  06/07/18   [provider]  Donnal Debar 100-62.5-25 MCG/INH AEPB INHALE 1 PUFF INTO LUNGS ONCE A DAY 07/06/18   Chesley Mires, MD  vitamin B-12 (CYANOCOBALAMIN) 1000 MCG tablet Take 1,000 mcg by mouth daily.    [provider]  warfarin (COUMADIN) 5 MG tablet TAKE AS DIRECTED BY COUMADIN CLINIC Patient taking differently: Take 2.5-5 mg by mouth at bedtime. Takes a full tablet daily except for Wed. Pt takes half a tablet (2.5mg ) 03/31/18   Jettie Booze, MD    Family History Family History  Problem Relation Age of Onset  . Asthma Father   . Stroke Father   . Hypertension Father   . Hypertension Mother   . Heart disease Brother   . Heart attack Brother   . Hypertension Sister   . Hypertension Brother     Social History Social History   Tobacco Use  . Smoking status: Former Smoker    Packs/day: 3.00    Years: 65.00    Pack years: 195.00    Types: Cigarettes    Last attempt to quit: 10/25/1998    Years since quitting: 19.9  . Smokeless tobacco: Never Used  Substance Use Topics  . Alcohol use: No    Alcohol/week: 0.0 standard drinks    Comment: remote history of heavy alcohol use  . Drug use: No     Allergies   Aldactone [spironolactone]   Review of Systems Review of Systems  Constitutional: Positive for fatigue. Negative for fever.  HENT: Negative for congestion, sore throat and trouble swallowing.   Eyes: Negative for visual disturbance.  Respiratory: Positive for cough. Negative for shortness of breath.   Cardiovascular: Negative for chest pain.  Gastrointestinal: Positive for nausea and vomiting. Negative for abdominal pain and diarrhea.  Genitourinary: Negative for dysuria and hematuria.    Musculoskeletal: Negative for myalgias.  Skin: Negative for rash.  Neurological: Positive for dizziness and weakness. Negative for facial asymmetry, speech difficulty and headaches.  Hematological: Bruises/bleeds easily.     Physical Exam Updated Vital Signs BP (!) 101/57   Pulse 82   Temp 98.3 F (36.8 C) (  Oral)   Resp 20   Ht 1.727 m (5\' 8" )   Wt 85.7 kg   SpO2 94%   BMI 28.74 kg/m   Physical Exam  Constitutional: He is oriented to person, place, and time. He appears well-developed and well-nourished. No distress.  HENT:  Head: Normocephalic and atraumatic.  Mouth/Throat: Oropharynx is clear and moist.  Eyes: Pupils are equal, round, and reactive to light. Conjunctivae and EOM are normal.  Neck: Neck supple.  Cardiovascular: Normal rate, regular rhythm and normal heart sounds.  Pulmonary/Chest: Effort normal and breath sounds normal. No respiratory distress. He has no wheezes. He has no rales.  Abdominal: Soft. Bowel sounds are normal. There is no tenderness.  Musculoskeletal: Normal range of motion. He exhibits no edema.  Neurological: He is alert and oriented to person, place, and time. No cranial nerve deficit or sensory deficit. He exhibits normal muscle tone. Coordination normal.  Skin: Skin is warm. No rash noted.  Nursing note and vitals reviewed.    ED Treatments / Results  Labs (all labs ordered are listed, but only abnormal results are displayed) Labs Reviewed  COMPREHENSIVE METABOLIC PANEL - Abnormal; Notable for the following components:      Result Value   Glucose, Bld 109 (*)    Creatinine, Ser 1.26 (*)    Calcium 8.7 (*)    Total Protein 6.2 (*)    Albumin 3.3 (*)    GFR calc non Af Amer 51 (*)    GFR calc Af Amer 60 (*)    All other components within normal limits  CBC WITH DIFFERENTIAL/PLATELET - Abnormal; Notable for the following components:   WBC 15.7 (*)    Hemoglobin 12.8 (*)    Neutro Abs 12.9 (*)    Monocytes Absolute 1.1 (*)    All  other components within normal limits  PROTIME-INR - Abnormal; Notable for the following components:   Prothrombin Time 28.2 (*)    All other components within normal limits  BRAIN NATRIURETIC PEPTIDE - Abnormal; Notable for the following components:   B Natriuretic Peptide 419.7 (*)    All other components within normal limits  CULTURE, BLOOD (ROUTINE X 2)  CULTURE, BLOOD (ROUTINE X 2)  LIPASE, BLOOD  URINALYSIS, ROUTINE W REFLEX MICROSCOPIC  I-STAT TROPONIN, ED  I-STAT CG4 LACTIC ACID, ED    EKG EKG Interpretation  Date/Time:  Sunday September 17 2018 07:40:18 EST Ventricular Rate:  83 PR Interval:    QRS Duration: 132 QT Interval:  429 QTC Calculation: 505 R Axis:   -41 Text Interpretation:  Atrial fibrillation Nonspecific IVCD with LAD Abnormal lateral Q waves Abnormal T, consider ischemia, lateral leads Not paced currently Reconfirmed by Fredia Sorrow [phone]) on 09/17/2018 8:05:09 AM   Radiology Dg Chest 2 View  Result Date: 09/17/2018 CLINICAL DATA:  Dizziness and weakness for few days, vomited on Saturday morning, legs gave out, history atrial fibrillation, CHF, COPD, coronary artery disease post MI and CABG, type II diabetes mellitus EXAM: CHEST - 2 VIEW COMPARISON:  08/03/2018 FINDINGS: LEFT subclavian AICD with leads projecting at RIGHT atrium, RIGHT ventricle, and coronary sinus. Enlargement of cardiac silhouette. Mediastinal contours and pulmonary vascularity normal. Atherosclerotic calcification aorta. RIGHT basilar atelectasis. Accentuated markings in the upper lobes slightly greater on RIGHT, unchanged. No acute infiltrate or pneumothorax. Tiny pleural effusions blunt the posterior costophrenic angles. Bones demineralized with chronic compression fracture of a vertebra at the thoracolumbar junction. IMPRESSION: Enlargement of cardiac silhouette post CABG and AICD. Bibasilar atelectasis greater on  RIGHT with tiny BILATERAL pleural effusions. Electronically Signed   By:  Lavonia Dana M.D.   On: 09/17/2018 09:19   Ct Chest W Contrast  Result Date: 09/17/2018 CLINICAL DATA:  84 year old male with persistent cough and shortness of breath. EXAM: CT CHEST WITH CONTRAST TECHNIQUE: Multidetector CT imaging of the chest was performed during intravenous contrast administration. CONTRAST:  7mL OMNIPAQUE IOHEXOL 300 MG/ML  SOLN COMPARISON:  01/15/2016 CT FINDINGS: Cardiovascular: Cardiomegaly, CABG changes and ICD again noted. Aortic atherosclerotic scratch de coronary artery and aortic atherosclerotic calcifications again noted. There is no evidence of thoracic aortic aneurysm or dissection. No pericardial effusion. Mediastinum/Nodes: No enlarged mediastinal, hilar, or axillary lymph nodes. Thyroid gland, trachea, and esophagus demonstrate no significant findings. Lungs/Pleura: A 1.1 x 1.3 x 1.9 cm irregular LEFT UPPER lobe nodule (series 4:39) is identified. Mild ground-glass/airspace/tree-in-bud opacities within the anterior RIGHT UPPER lobe, posterior RIGHT UPPER lobe and within the RIGHT LOWER lobe identified likely representing infection. Moderate centrilobular emphysema is identified. Mild peribronchial thickening is present. No pleural effusion or pneumothorax. Upper Abdomen: A 1.8 cm gallstone is present. No CT evidence of acute cholecystitis. Musculoskeletal: No acute or suspicious bony abnormalities noted. Remote rib fractures and L1 fracture again noted. IMPRESSION: 1. Irregular 1.1 x 1.3 x 1.9 cm LEFT UPPER lobe nodule worrisome for malignancy. Consider one of the following for both low-risk and high-risk individuals: (a) repeat chest CT in 3 months, (b) follow-up PET-CT, or (c) tissue sampling. This recommendation follows the consensus statement: Guidelines for Management of Incidental Pulmonary Nodules Detected on CT Images: From the Fleischner Society 2017; Radiology 2017; 284:228-243. 2. Mild ground-glass/airspace/tree-in-bud opacities within the RIGHT UPPER and LOWER  lobe likely representing infection. 3. Cardiomegaly and coronary artery disease.  Status post CABG. 4. Cholelithiasis 5. Aortic Atherosclerosis (ICD10-I70.0) and Emphysema (ICD10-J43.9). Electronically Signed   By: Margarette Canada M.D.   On: 09/17/2018 11:41    Procedures Procedures (including critical care time)  Medications Ordered in ED Medications  ceFEPIme (MAXIPIME) 2 g in sodium chloride 0.9 % 100 mL IVPB (has no administration in time range)  0.9 %  sodium chloride infusion (has no administration in time range)  sodium chloride 0.9 % bolus 500 mL (has no administration in time range)  iohexol (OMNIPAQUE) 300 MG/ML solution 75 mL (75 mLs Intravenous Contrast Given 09/17/18 1059)     Initial Impression / Assessment and Plan / ED Course  I have reviewed the triage vital signs and the nursing notes.  Pertinent labs & imaging results that were available during my care of the patient were reviewed by me and considered in my medical decision making (see chart for details).    His EKG consistent with a heart rate in the 80s.  Seems to be underlying atrial fibrillation.  May be having atrial pump pacing.  But certainly not having ventricular pacing.  Old EKGs show evidence of ventricular pacing at a rate of 70.  Troponin was negative.  Chest x-ray showed some small bilateral pleural effusions but no specific findings.  He had a marked leukocytosis with a white count around 15,000.  Patient also had orthostatic hypotension when he was sitting.  Laying down blood pressures remained mid 90s.  Not tachycardic not febrile.  Did not meet sepsis criteria based on vital signs but did not have a lactic acid.  Suspicious that something was ongoing so get CT of chest with contrast which showed evidence of redeveloping right sided pneumonia.  Also a suspicious pulmonary nodule that  could be malignant.  Based on this patient had blood cultures done.  And lactic acid ordered.  Patient started on  hospital-acquired pneumonia antibiotics standard protocol.  Patient received 500 cc of fluid.  Due to his cardiac history did not receive a lot of fluid.  Discussed with hospitalist they will admit.   Final Clinical Impressions(s) / ED Diagnoses   Final diagnoses:  HCAP (healthcare-associated pneumonia)  Pulmonary nodule  Hypotension, unspecified hypotension type    ED Discharge Orders    None       Fredia Sorrow, MD 09/17/18 1824

## 2018-09-17 NOTE — ED Notes (Signed)
Patient transported to X-ray 

## 2018-09-17 NOTE — ED Notes (Signed)
[employer] pacemaker interrogated °

## 2018-09-17 NOTE — ED Triage Notes (Signed)
Patient arrived via [facility] from home reporting dizziness and weakness for a few days. He reports feeling "sick" on Friday night, vomited on Saturday morning and felt "bad" all day Saturday. This morning he reports "my legs gave out and I caught myself on the counter and leaned over." Patient also reports that his right side neck hurts and his head feels heavy and "cant barely hold it up." Spouse at the bedside.  Patient is A& O X 4, he is a good historian of his health, he has a Pacemaker([employer]tron[employer]), and  he is hard of hearing(did not bring his hearing aids).

## 2018-09-17 NOTE — H&P (Addendum)
History and Physical    [name] UXN:[id number] DOB: [birth year] DOA: 09/17/2018  I have briefly reviewed the patient's prior medical records in [place]  PCP: [name], MD  Patient coming from: Home  Chief Complaint: Weakness  HPI: [name] is a 84 y.o. male with medical history significant of persistent A. fib on amiodarone and anticoagulated with Coumadin, coronary artery disease status post CABG, COPD with history of tobacco use, hypertension, hyperlipidemia, hypothyroidism, chronic systolic CHF, who presents to the hospital with a chief complaint of generalized weakness over the last couple of days.  Patient also had an episode of nausea and vomiting x1 yesterday morning.  He has been complaining of a cough (he does have a chronic cough), felt a little bit worse as well as with clear sputum production over the last couple of days.  He denies any fever or chills, denies any sore throat or muscle aches.  He denies any chest pains, denies any palpitations.  No abdominal pain, currently denies any nausea or any further emesis episodes.  He has no diarrhea.  ED Course: In the emergency room he is afebrile, blood pressure is on the soft side in the 90s, he is satting well on room air.  Blood work reveals a leukocytosis with a white count of 15, creatinine 1.2, lactic acid is 1.4.  BNP is slightly elevated at 119.  Underwent chest x-ray followed by CT scan of the chest which showed groundglass opacities within the right upper and right lower lobes likely representing infection.  He was given broad-spectrum antibiotics and we are asked to admit  Review of Systems: As per HPI otherwise 10 point review of systems negative.   Past Medical History:  Diagnosis Date  . Atrial fibrillation ([place])    persistent, previously seen at [facility] and placed on amiodarone  . Benign prostatic hypertrophy   . CAD (coronary artery disease)    multivessel s/p inferolateral wall MI with  subsequent CABG 11/1998.  Cath 2009 with Patent grafts  . Cleft palate   . COPD with emphysema ([place]) 04/01/2010  . DM (diabetes mellitus), type 2 ([place])   . Dyspnea   . GERD (gastroesophageal reflux disease)   . HTN (hypertension)   . Hyperlipidemia   . Hypothyroidism   . Iron deficiency anemia   . Ischemic dilated cardiomyopathy ([place])    EF 35-40% by MUGA 6/11  . Myocardial infarction ([place])   . Nasal septal deviation   . Nephrolithiasis   . OSA (obstructive sleep apnea)   . PAF (paroxysmal atrial fibrillation) ([place])   . Peripheral arterial disease ([facility])    left subclavian artery stenosis  . PNA (pneumonia)   . Psoriasis   . Seborrheic keratosis   . Stroke ([place])   . Systolic congestive heart failure ([place]) 2009   s/p BiV ICD implantation by Dr Leonia Reeves (MDT)    Past Surgical History:  Procedure Laterality Date  . BI-VENTRICULAR IMPLANTABLE CARDIOVERTER DEFIBRILLATOR  (CRT-D)  10-08-08; 11-06-2013   Dr Leonia Reeves (MDT) implant for primary prevention; gen change to MDT VivaXT CRTD by Dr Rayann Heman  . BIV ICD GENERTAOR CHANGE OUT N/A 11/06/2013   Procedure: BIV ICD GENERTAOR CHANGE OUT;  Surgeon: Coralyn Mark, MD;  Location[facility] CATH LAB;  Service: Cardiovascular;  Laterality: N/A;  . c-spine surgery    . CARDIOVERSION N/A 04/29/2016   Procedure: CARDIOVERSION;  Surgeon: Pixie Casino, MD;  Location: [place];  Service: Cardiovascular;  Laterality: N/A;  .  CARPAL TUNNEL RELEASE    . CATARACT EXTRACTION    . CORONARY ARTERY BYPASS GRAFT     LIMA to LAD, SVG to OM, SVG to diagonal  . ELECTROPHYSIOLOGIC STUDY N/A 06/11/2016   Procedure: Atrial Fibrillation Ablation;  Surgeon: Thompson Grayer, MD;  Location: [place] CV LAB;  Service: Cardiovascular;  Laterality: N/A;  . left cleft palate and left cleft lip repair       reports that he quit smoking about 19 years ago. His smoking use included cigarettes. He has a 195.00 pack-year smoking history. He has never used smokeless tobacco. He  reports that he does not drink alcohol or use drugs.  Allergies  Allergen Reactions  . Aldactone [Spironolactone] Other (See Comments)    Hyperkalemia  reported by Dr. [name] - pt is currently taking 12.5 mg daily -November 2017 per medication list by same MD    Family History  Problem Relation Age of Onset  . Asthma Father   . Stroke Father   . Hypertension Father   . Hypertension Mother   . Heart disease Brother   . Heart attack Brother   . Hypertension Sister   . Hypertension Brother     Prior to Admission medications   Medication Sig Start Date End Date Taking? Authorizing Provider  albuterol (PROVENTIL HFA;VENTOLIN HFA) 108 (90 BASE) MCG/ACT inhaler Inhale 2 puffs into the lungs every 6 (six) hours as needed for wheezing or shortness of breath. Must keep June 2016 appt. 03/04/15   Chesley Mires, MD  albuterol (PROVENTIL) (2.5 MG/3ML) 0.083% nebulizer solution USE 1 VIAL VIA NEBULIZER 3 TIMES A DAY AS DIRECTED. DX:J44.9 08/21/18   Chesley Mires, MD  amiodarone (PACERONE) 400 MG tablet TAKE HALF TABLET (200 MG TOTAL) BY MOUTH DAILY. 02/21/18   Allred, Jeneen Rinks, MD  atorvastatin (LIPITOR) 80 MG tablet TAKE 1 TABLET (80 MG TOTAL) BY MOUTH DAILY. 07/10/18   Jettie Booze, MD  azithromycin (ZITHROMAX) 500 MG tablet Take 1 tablet (500 mg total) by mouth daily. 07/15/18   Thurnell Lose, MD  bisacodyl (DULCOLAX) 5 MG EC tablet Take 2 tablets (10 mg total) by mouth daily at 12 noon. 09/06/16   Regalado, Belkys A, MD  carvedilol (COREG) 6.25 MG tablet TAKE 1 TABLET BY MOUTH TWICE A DAY 05/02/18   Jettie Booze, MD  cholecalciferol (VITAMIN D) 1000 units tablet Take 2,000 Units by mouth every evening.    [provider]  dextromethorphan-guaiFENesin (MUCINEX DM) 30-600 MG 12hr tablet Take 1 tablet by mouth 2 (two) times daily as needed for cough.    [provider]  ezetimibe (ZETIA) 10 MG tablet Take 10 mg by mouth daily.    [provider]  ferrous  sulfate 325 (65 FE) MG tablet Take 325 mg by mouth every evening.     [provider]  fluticasone (FLONASE) 50 MCG/ACT nasal spray SPRAY 2 SPRAYS INTO EACH NOSTRIL EVERY DAY 08/16/18   Chesley Mires, MD  furosemide (LASIX) 40 MG tablet Take 2 tablets (80 mg total) by mouth 2 (two) times daily. 09/15/18   Jettie Booze, MD  insulin aspart (NOVOLOG FLEXPEN) 100 UNIT/ML FlexPen Inject 4 Units into the skin 3 (three) times daily before meals.     [provider]  insulin degludec (TRESIBA FLEXTOUCH) 100 UNIT/ML SOPN FlexTouch Pen Inject 0.3 mLs (30 Units total) into the skin daily before lunch. Patient taking differently: Inject 18 Units into the skin daily before lunch.  09/06/16   Regalado,  Belkys A, MD  levothyroxine (SYNTHROID, LEVOTHROID) 100 MCG tablet Take 100 mcg by mouth daily before breakfast.  09/23/15   [provider]  Multiple Vitamins-Minerals (PRESERVISION AREDS PO) Take 1 tablet by mouth 2 (two) times daily.    [provider]  potassium chloride 20 MEQ TBCR Take 20 mEq by mouth daily. 07/15/18   Thurnell Lose, MD  PRESCRIPTION MEDICATION Inhale into the lungs at bedtime. CPAP    [provider]  senna (SENOKOT) 8.6 MG tablet Take 2 tablets by mouth at bedtime.     [provider]  SPIRIVA RESPIMAT 2.5 MCG/ACT AERS INHALE 2 PUFFS INTO THE LUNGS DAILY. 04/11/17   Chesley Mires, MD  spironolactone (ALDACTONE) 25 MG tablet TAKE 1/2 TABLET (12.5 MG TOTAL) BY MOUTH DAILY. 06/01/18   Jettie Booze, MD  traMADol (ULTRAM) 50 MG tablet Take 50 mg by mouth 3 (three) times daily as needed for moderate pain.  06/07/18   [provider]  Donnal Debar 100-62.5-25 MCG/INH AEPB INHALE 1 PUFF INTO LUNGS ONCE A DAY 07/06/18   Chesley Mires, MD  vitamin B-12 (CYANOCOBALAMIN) 1000 MCG tablet Take 1,000 mcg by mouth daily.    [provider]  warfarin (COUMADIN) 5 MG tablet TAKE AS DIRECTED BY COUMADIN CLINIC Patient taking  differently: Take 2.5-5 mg by mouth at bedtime. Takes a full tablet daily except for Wed. Pt takes half a tablet (2.5mg ) 03/31/18   Jettie Booze, MD    Physical Exam: Vitals:   09/17/18 1100 09/17/18 1130 09/17/18 1145 09/17/18 1200  BP: 98/63 106/65 (!) 88/68 (!) 101/57  Pulse: 81 80 81 82  Resp: 20 20 16 20   Temp:      TempSrc:      SpO2: 96% 95% 95% 94%  Weight:      Height:          Constitutional: NAD, calm, comfortable Eyes: PERRL, lids and conjunctivae normal ENMT: Mucous membranes are moist. Posterior pharynx clear of any exudate or lesions. Neck: normal, supple Respiratory: clear to auscultation bilaterally, no wheezing, no crackles. Normal respiratory effort.  Cardiovascular: irregular. Trace lower extremity edema. 2+ pedal pulses.  Abdomen: no tenderness, no masses palpated. Bowel sounds positive.  Musculoskeletal: no clubbing / cyanosis. Normal muscle tone.  Skin: no rashes, lesions, ulcers. No induration Neurologic: CN 2-12 grossly intact. Strength 5/5 in all 4.  Psychiatric: Normal judgment and insight. Alert and oriented x 3. Normal mood.   Labs on Admission: I have personally reviewed following labs and imaging studies  CBC: Recent Labs  Lab 09/17/18 0850  WBC 15.7*  NEUTROABS 12.9*  HGB 12.8*  HCT 41.2  MCV 96.7  PLT 128   Basic Metabolic Panel: Recent Labs  Lab 09/17/18 0850  NA 135  K 3.8  CL 102  CO2 25  GLUCOSE 109*  BUN 14  CREATININE 1.26*  CALCIUM 8.7*   GFR: Estimated Creatinine Clearance: 48.1 mL/min (A) (by C-G formula based on SCr of 1.26 mg/dL (H)). Liver Function Tests: Recent Labs  Lab 09/17/18 0850  AST 18  ALT 10  ALKPHOS 59  BILITOT 0.8  PROT 6.2*  ALBUMIN 3.3*   Recent Labs  Lab 09/17/18 0850  LIPASE 26   No results for input(s): AMMONIA in the last 168 hours. Coagulation Profile: Recent Labs  Lab 09/14/18 0851 09/17/18 0850  INR 2.3 2.69   Cardiac Enzymes: No results for input(s): CKTOTAL,  CKMB, CKMBINDEX, TROPONINI in the last 168 hours. BNP (last 3  results) Recent Labs    02/27/18 1045 03/06/18 0752 03/13/18 0805  PROBNP 10,884* 12,873* 5,305*   HbA1C: No results for input(s): HGBA1C in the last 72 hours. CBG: No results for input(s): GLUCAP in the last 168 hours. Lipid Profile: No results for input(s): CHOL, HDL, LDLCALC, TRIG, CHOLHDL, LDLDIRECT in the last 72 hours. Thyroid Function Tests: No results for input(s): TSH, T4TOTAL, FREET4, T3FREE, THYROIDAB in the last 72 hours. Anemia Panel: No results for input(s): VITAMINB12, FOLATE, FERRITIN, TIBC, IRON, RETICCTPCT in the last 72 hours. Urine analysis:    Component Value Date/Time   COLORURINE YELLOW 09/17/2018 [place] 09/17/2018 0850   LABSPEC 1.006 09/17/2018 0850   PHURINE 5.0 09/17/2018 0850   GLUCOSEU NEGATIVE 09/17/2018 0850   HGBUR NEGATIVE 09/17/2018 0850   BILIRUBINUR NEGATIVE 09/17/2018 0850   KETONESUR NEGATIVE 09/17/2018 0850   PROTEINUR NEGATIVE 09/17/2018 0850   UROBILINOGEN 0.2 11/28/2008 0325   NITRITE NEGATIVE 09/17/2018 0850   LEUKOCYTESUR NEGATIVE 09/17/2018 0850     Radiological Exams on Admission: Dg Chest 2 View  Result Date: 09/17/2018 CLINICAL DATA:  Dizziness and weakness for few days, vomited on Saturday morning, legs gave out, history atrial fibrillation, CHF, COPD, coronary artery disease post MI and CABG, type II diabetes mellitus EXAM: CHEST - 2 VIEW COMPARISON:  08/03/2018 FINDINGS: LEFT subclavian AICD with leads projecting at RIGHT atrium, RIGHT ventricle, and coronary sinus. Enlargement of cardiac silhouette. Mediastinal contours and pulmonary vascularity normal. Atherosclerotic calcification aorta. RIGHT basilar atelectasis. Accentuated markings in the upper lobes slightly greater on RIGHT, unchanged. No acute infiltrate or pneumothorax. Tiny pleural effusions blunt the posterior costophrenic angles. Bones demineralized with chronic compression fracture  of a vertebra at the thoracolumbar junction. IMPRESSION: Enlargement of cardiac silhouette post CABG and AICD. Bibasilar atelectasis greater on RIGHT with tiny BILATERAL pleural effusions. Electronically Signed   By: Lavonia Dana M.D.   On: 09/17/2018 09:19   Ct Chest W Contrast  Result Date: 09/17/2018 CLINICAL DATA:  84 year old male with persistent cough and shortness of breath. EXAM: CT CHEST WITH CONTRAST TECHNIQUE: Multidetector CT imaging of the chest was performed during intravenous contrast administration. CONTRAST:  46mL OMNIPAQUE IOHEXOL 300 MG/ML  SOLN COMPARISON:  01/15/2016 CT FINDINGS: Cardiovascular: Cardiomegaly, CABG changes and ICD again noted. Aortic atherosclerotic scratch de coronary artery and aortic atherosclerotic calcifications again noted. There is no evidence of thoracic aortic aneurysm or dissection. No pericardial effusion. Mediastinum/Nodes: No enlarged mediastinal, hilar, or axillary lymph nodes. Thyroid gland, trachea, and esophagus demonstrate no significant findings. Lungs/Pleura: A 1.1 x 1.3 x 1.9 cm irregular LEFT UPPER lobe nodule (series 4:39) is identified. Mild ground-glass/airspace/tree-in-bud opacities within the anterior RIGHT UPPER lobe, posterior RIGHT UPPER lobe and within the RIGHT LOWER lobe identified likely representing infection. Moderate centrilobular emphysema is identified. Mild peribronchial thickening is present. No pleural effusion or pneumothorax. Upper Abdomen: A 1.8 cm gallstone is present. No CT evidence of acute cholecystitis. Musculoskeletal: No acute or suspicious bony abnormalities noted. Remote rib fractures and L1 fracture again noted. IMPRESSION: 1. Irregular 1.1 x 1.3 x 1.9 cm LEFT UPPER lobe nodule worrisome for malignancy. Consider one of the following for both low-risk and high-risk individuals: (a) repeat chest CT in 3 months, (b) follow-up PET-CT, or (c) tissue sampling. This recommendation follows the consensus statement: Guidelines for  Management of Incidental Pulmonary Nodules Detected on CT Images: From the Fleischner Society 2017; Radiology 2017; 284:228-243. 2. Mild ground-glass/airspace/tree-in-bud opacities within the RIGHT UPPER and LOWER lobe likely representing  infection. 3. Cardiomegaly and coronary artery disease.  Status post CABG. 4. Cholelithiasis 5. Aortic Atherosclerosis (ICD10-I70.0) and Emphysema (ICD10-J43.9). Electronically Signed   By: Margarette Canada M.D.   On: 09/17/2018 11:41    EKG: Independently reviewed. A fib  Assessment/Plan Active Problems:   HCAP (healthcare-associated pneumonia)    Multifocal pneumonia -In the right upper and lower lobes, patient was recently admitted to the hospital in September and will cover broadly with vancomycin and Zosyn for healthcare associated.  Had an episode of emesis few days ago and cannot really rule out aspiration component as well -Currently has a productive cough, will send sputum cultures  Left upper lobe nodule -Seen on the CT scan, 1.1 x 1.3 x 1.9 cm, this will need to be further worked up as an outpatient  CKD 3 -Baseline creatinine around 1.2, currently at baseline  Paroxysmal A. fib -Continue anticoagulation with Coumadin, continue amiodarone, hold Coreg today given soft blood pressures in the ED  History of emphysema/COPD -Treat pneumonia as above, no wheezing, no hypoxia  Diabetes mellitus -Most recent A1c in our system in April 2017 was 7.7 showing uncontrolled diabetes -Resume home medications, place on home Tresiba at a lower dose, placed on sliding scale  Chronic systolic CHF -No significant fluid overload noted, for now hold Coreg as above and hold furosemide given soft blood pressures in the ED, he was given small 500 cc bolus by EDP, hold standing fluids and allow diet -Most recent 2D echo in 2017 with EF of 35-40%  Hypothyroidism -Continue Synthroid  Hyperlipidemia -Continue atorvastatin, continue Zetia   DVT prophylaxis:  Coumadin  Code Status: Full code  Family Communication: family at bedside Disposition Plan: admit to telemetry  Consults called: none     Marzetta Board, MD, PhD [facility] Pager [phone]  If 7PM-7AM, please contact night-coverage [email or web address] Password [email or web address]  09/17/2018, 1:37 PM

## 2018-09-17 NOTE — Progress Notes (Addendum)
Pharmacy Antibiotic Note  [name] is a 84 y.o. male admitted on 09/17/2018 with pneumonia.  Pharmacy has been consulted for vancomycin and zosyn dosing.  Cefepime x 1 in ED, WBC 15.7, afebrile, hypotensive.   SCr 1.26, CrCl ~ 48 mL/min    Plan: Vancomycin 1500mg  IV x 1, then 750mg  IV every 12 hours Zosyn 3.375g IV every 8 hours (4h infusion) F/u Cx, LOT Monitor renal function, vancomycin level at steady state  Height: 5\' 8"  (172.7 cm) Weight: 189 lb (85.7 kg) IBW/kg (Calculated) : 68.4  Temp (24hrs), Avg:98.3 F (36.8 C), Min:98.3 F (36.8 C), Max:98.3 F (36.8 C)  Recent Labs  Lab 09/17/18 0850  WBC 15.7*  CREATININE 1.26*    Estimated Creatinine Clearance: 48.1 mL/min (A) (by C-G formula based on SCr of 1.26 mg/dL (H)).    Allergies  Allergen Reactions  . Aldactone [Spironolactone] Other (See Comments)    Hyperkalemia  reported by Dr. [name] - pt is currently taking 12.5 mg daily -November 2017 per medication list by same MD    Antimicrobials this admission: Vanc 11/24>> Zosyn 11/24>> Cefepime x 1  Dose adjustments this admission: n/a  Microbiology results: 11/24 BCx: sent   Bertis Ruddy, PharmD Clinical Pharmacist Please check AMION for all [place] numbers 09/17/2018 1:37 PM   Addendum: IV access lost in ED, only one access available and Cefepime not given in ED.  Pt transferred to 2W and RN contacted to give zosyn immediately which was tubed STAT to location.

## 2018-09-18 DIAGNOSIS — I480 Paroxysmal atrial fibrillation: Secondary | ICD-10-CM | POA: Diagnosis not present

## 2018-09-18 DIAGNOSIS — I420 Dilated cardiomyopathy: Secondary | ICD-10-CM | POA: Diagnosis present

## 2018-09-18 DIAGNOSIS — I959 Hypotension, unspecified: Secondary | ICD-10-CM | POA: Diagnosis present

## 2018-09-18 DIAGNOSIS — Z79899 Other long term (current) drug therapy: Secondary | ICD-10-CM | POA: Diagnosis not present

## 2018-09-18 DIAGNOSIS — Z951 Presence of aortocoronary bypass graft: Secondary | ICD-10-CM | POA: Diagnosis not present

## 2018-09-18 DIAGNOSIS — R911 Solitary pulmonary nodule: Secondary | ICD-10-CM | POA: Diagnosis present

## 2018-09-18 DIAGNOSIS — E039 Hypothyroidism, unspecified: Secondary | ICD-10-CM | POA: Diagnosis present

## 2018-09-18 DIAGNOSIS — I5022 Chronic systolic (congestive) heart failure: Secondary | ICD-10-CM | POA: Diagnosis present

## 2018-09-18 DIAGNOSIS — I25118 Atherosclerotic heart disease of native coronary artery with other forms of angina pectoris: Secondary | ICD-10-CM | POA: Diagnosis not present

## 2018-09-18 DIAGNOSIS — N179 Acute kidney failure, unspecified: Secondary | ICD-10-CM | POA: Diagnosis present

## 2018-09-18 DIAGNOSIS — I4819 Other persistent atrial fibrillation: Secondary | ICD-10-CM | POA: Diagnosis present

## 2018-09-18 DIAGNOSIS — Z794 Long term (current) use of insulin: Secondary | ICD-10-CM | POA: Diagnosis not present

## 2018-09-18 DIAGNOSIS — I130 Hypertensive heart and chronic kidney disease with heart failure and stage 1 through stage 4 chronic kidney disease, or unspecified chronic kidney disease: Secondary | ICD-10-CM | POA: Diagnosis present

## 2018-09-18 DIAGNOSIS — I2510 Atherosclerotic heart disease of native coronary artery without angina pectoris: Secondary | ICD-10-CM | POA: Diagnosis present

## 2018-09-18 DIAGNOSIS — J181 Lobar pneumonia, unspecified organism: Secondary | ICD-10-CM

## 2018-09-18 DIAGNOSIS — Z7951 Long term (current) use of inhaled steroids: Secondary | ICD-10-CM | POA: Diagnosis not present

## 2018-09-18 DIAGNOSIS — J440 Chronic obstructive pulmonary disease with acute lower respiratory infection: Secondary | ICD-10-CM | POA: Diagnosis present

## 2018-09-18 DIAGNOSIS — Z79890 Hormone replacement therapy (postmenopausal): Secondary | ICD-10-CM | POA: Diagnosis not present

## 2018-09-18 DIAGNOSIS — E1122 Type 2 diabetes mellitus with diabetic chronic kidney disease: Secondary | ICD-10-CM | POA: Diagnosis present

## 2018-09-18 DIAGNOSIS — Z87891 Personal history of nicotine dependence: Secondary | ICD-10-CM | POA: Diagnosis not present

## 2018-09-18 DIAGNOSIS — E785 Hyperlipidemia, unspecified: Secondary | ICD-10-CM | POA: Diagnosis present

## 2018-09-18 DIAGNOSIS — J449 Chronic obstructive pulmonary disease, unspecified: Secondary | ICD-10-CM | POA: Diagnosis not present

## 2018-09-18 DIAGNOSIS — R531 Weakness: Secondary | ICD-10-CM | POA: Diagnosis present

## 2018-09-18 DIAGNOSIS — Z8249 Family history of ischemic heart disease and other diseases of the circulatory system: Secondary | ICD-10-CM | POA: Diagnosis not present

## 2018-09-18 DIAGNOSIS — Z7901 Long term (current) use of anticoagulants: Secondary | ICD-10-CM | POA: Diagnosis not present

## 2018-09-18 DIAGNOSIS — I472 Ventricular tachycardia: Secondary | ICD-10-CM | POA: Diagnosis present

## 2018-09-18 DIAGNOSIS — J189 Pneumonia, unspecified organism: Secondary | ICD-10-CM | POA: Diagnosis present

## 2018-09-18 DIAGNOSIS — I252 Old myocardial infarction: Secondary | ICD-10-CM | POA: Diagnosis not present

## 2018-09-18 DIAGNOSIS — E1151 Type 2 diabetes mellitus with diabetic peripheral angiopathy without gangrene: Secondary | ICD-10-CM | POA: Diagnosis present

## 2018-09-18 DIAGNOSIS — Y95 Nosocomial condition: Secondary | ICD-10-CM | POA: Diagnosis present

## 2018-09-18 LAB — BASIC METABOLIC PANEL
Anion gap: 7 (ref 5–15)
BUN: 20 mg/dL (ref 8–23)
CO2: 24 mmol/L (ref 22–32)
Calcium: 8.5 mg/dL — ABNORMAL LOW (ref 8.9–10.3)
Chloride: 107 mmol/L (ref 98–111)
Creatinine, Ser: 1.56 mg/dL — ABNORMAL HIGH (ref 0.61–1.24)
GFR calc Af Amer: 46 mL/min — ABNORMAL LOW (ref >60)
GFR calc non Af Amer: 40 mL/min — ABNORMAL LOW (ref >60)
Glucose, Bld: 130 mg/dL — ABNORMAL HIGH (ref 70–99)
Potassium: 4 mmol/L (ref 3.5–5.1)
Sodium: 138 mmol/L (ref 135–145)

## 2018-09-18 LAB — GLUCOSE, CAPILLARY
Glucose-Capillary: 72 mg/dL (ref 70–99)
Glucose-Capillary: 106 mg/dL — ABNORMAL HIGH (ref 70–99)
Glucose-Capillary: 103 mg/dL — ABNORMAL HIGH (ref 70–99)
Glucose-Capillary: 104 mg/dL — ABNORMAL HIGH (ref 70–99)
Glucose-Capillary: 113 mg/dL — ABNORMAL HIGH (ref 70–99)

## 2018-09-18 LAB — CBC
HCT: 40.8 % (ref 39.0–52.0)
Hemoglobin: 13 g/dL (ref 13.0–17.0)
MCH: 29.9 pg (ref 26.0–34.0)
MCHC: 31.9 g/dL (ref 30.0–36.0)
MCV: 93.8 fL (ref 80.0–100.0)
Platelets: 183 10*3/uL (ref 150–400)
RBC: 4.35 MIL/uL (ref 4.22–5.81)
RDW: 14.1 % (ref 11.5–15.5)
WBC: 10.7 10*3/uL — ABNORMAL HIGH (ref 4.0–10.5)
nRBC: 0 % (ref 0.0–0.2)

## 2018-09-18 LAB — PROTIME-INR
INR: 2.53
Prothrombin Time: 26.9 seconds — ABNORMAL HIGH (ref 11.4–15.2)

## 2018-09-18 LAB — HIV ANTIBODY (ROUTINE TESTING W REFLEX): HIV Screen 4th Generation wRfx: NONREACTIVE

## 2018-09-18 LAB — STREP PNEUMONIAE URINARY ANTIGEN: Strep Pneumo Urinary Antigen: NEGATIVE

## 2018-09-18 LAB — MAGNESIUM: Magnesium: 1.9 mg/dL (ref 1.7–2.4)

## 2018-09-18 MED ORDER — CARVEDILOL 6.25 MG PO TABS
6.2500 mg | ORAL_TABLET | Freq: Two times a day (BID) | ORAL | Status: DC
  Administered 2018-09-18 – 2018-09-20 (×5): 6.25 mg via ORAL
  Filled 2018-09-18 (×4): qty 1

## 2018-09-18 MED ORDER — SODIUM CHLORIDE 0.9 % IV SOLN
500.0000 mg | INTRAVENOUS | Status: DC
  Administered 2018-09-18 – 2018-09-19 (×2): 500 mg via INTRAVENOUS
  Filled 2018-09-18 (×3): qty 500

## 2018-09-18 MED ORDER — WARFARIN SODIUM 5 MG PO TABS
5.0000 mg | ORAL_TABLET | Freq: Once | ORAL | Status: AC
  Administered 2018-09-18: 5 mg via ORAL
  Filled 2018-09-18: qty 1

## 2018-09-18 MED ORDER — SODIUM CHLORIDE 0.9 % IV SOLN
1.0000 g | INTRAVENOUS | Status: DC
  Administered 2018-09-18 – 2018-09-19 (×2): 1 g via INTRAVENOUS
  Filled 2018-09-18 (×3): qty 10

## 2018-09-18 NOTE — Progress Notes (Signed)
Progress Note    [name]  DVV:[id number] DOB: 1934/09/04  DOA: 09/17/2018 PCP: [name], MD    Brief Narrative:     Medical records reviewed and are as summarized below:  [name] is an 84 y.o. male with medical history significant of persistent A. fib on amiodarone and anticoagulated with Coumadin, coronary artery disease status post CABG, COPD with history of tobacco use, hypertension, hyperlipidemia, hypothyroidism, chronic systolic CHF, who presents to the hospital with a chief complaint of generalized weakness over the last couple of days.  Recent increase of his lasix dose.  Found to have a pneumonia and pulmonary nodule concerning for malignancy.  Assessment/Plan:   Active Problems:   HCAP (healthcare-associated pneumonia)  Multifocal pneumonia -was recently in hospital but want to avoid nephrotoxic agents such as vanc due to AKI -no fever- improving WBC will change to IV rocephin/azithromycin -cultures pending -doubt post obstructive as nodule on left and pneumonia on right -? Aspiration as he did vomit x 1  Left upper lobe nodule -Seen on the CT scan, 1.1 x 1.3 x 1.9 cm, this will need to be further worked up as an outpatient  AKI on CKD 3  -concern as he was given contrast with CT Scan -avoid nephrotoxic agents- d/c vanc  -renal U/S -bladder scan to r/o obstruction -holding diuretics for now -was also hypotensive so could be from this as well  Paroxysmal A. fib -Continue anticoagulation with Coumadin- pharmacy consult -resume coreg/amiodarone  Runs of v-tach -Mg ok -K ok -restart coreg and monitor  Diabetes mellitus -Most recent A1c in our system in April 2017 was 7.7 showing uncontrolled diabetes -Resume home medications, place on home Tresiba at a lower dose, placed on sliding scale  Chronic systolic CHF -Most recent 2D echo in 2017 with EF of 35-40% -recent change of lasix on 11/21 from 40 mg BID to 80 mg BID (was  taking incorrectly)  Hypothyroidism -Continue Synthroid  Hyperlipidemia -Continue atorvastatin, continue Zetia   Family Communication/Anticipated D/C date and plan/Code Status   DVT prophylaxis: coumadin Code Status: Full Code.  Family Communication: none at bedside Disposition Plan: pending improvement        Subjective:   No further nausea No dizziness with ambulation Says he gets pneumonia once per year  Objective:    Vitals:   09/17/18 1547 09/17/18 2329  09/18/18 0833  BP: (!) 123/59 136/68  (!) 114/54  Pulse: 80 84    Resp:  18    Temp: 97.9 F (36.6 C) 98 F (36.7 C)    TempSrc: Oral Oral    SpO2: 92% 99%    Weight:      Height:        Intake/Output Summary (Last 24 hours) at 09/18/2018 1259 Last data filed at 09/18/2018 0800 Gross per 24 hour  Intake 1352.58 ml  Output -  Net 1352.58 ml   Filed Weights   09/17/18 0753  Weight: 85.7 kg    Exam: In chair- appears comfortable Irr, mild LE edema but non-pitting Diminished breath sounds but no wheezing +BS, soft A+Ox3 Pleasant and cooperative  Data Reviewed:   I have personally reviewed following labs and imaging studies:  Labs: Labs show the following:   Basic Metabolic Panel: Recent Labs  Lab 09/17/18 0850 09/18/18 1110  NA 135 138  K 3.8 4.0  CL 102 107  CO2 25 24  GLUCOSE 109* 130*  BUN 14 20  CREATININE 1.26* 1.56*  CALCIUM 8.7*  8.5*  MG  --  1.9   GFR Estimated Creatinine Clearance: 38.9 mL/min (A) (by C-G formula based on SCr of 1.56 mg/dL (H)). Liver Function Tests: Recent Labs  Lab 09/17/18 0850  AST 18  ALT 10  ALKPHOS 59  BILITOT 0.8  PROT 6.2*  ALBUMIN 3.3*   Recent Labs  Lab 09/17/18 0850  LIPASE 26   No results for input(s): AMMONIA in the last 168 hours. Coagulation profile Recent Labs  Lab 09/14/18 0851 09/17/18 0850 09/18/18 0356  INR 2.3 2.69 2.53    CBC: Recent Labs  Lab 09/17/18 0850 09/18/18 0356  WBC 15.7* 10.7*    NEUTROABS 12.9*  --   HGB 12.8* 13.0  HCT 41.2 40.8  MCV 96.7 93.8  PLT 197 183   Cardiac Enzymes: No results for input(s): CKTOTAL, CKMB, CKMBINDEX, TROPONINI in the last 168 hours. BNP (last 3 results) Recent Labs    02/27/18 1045 03/06/18 0752 03/13/18 0805  PROBNP 10,884* 12,873* 5,305*   CBG: Recent Labs  Lab 09/17/18 1734 09/17/18 2115 09/18/18 0458 09/18/18 0734 09/18/18 1143  GLUCAP 171* 96 72 106* 103*   D-Dimer: No results for input(s): DDIMER in the last 72 hours. Hgb A1c: No results for input(s): HGBA1C in the last 72 hours. Lipid Profile: No results for input(s): CHOL, HDL, LDLCALC, TRIG, CHOLHDL, LDLDIRECT in the last 72 hours. Thyroid function studies: No results for input(s): TSH, T4TOTAL, T3FREE, THYROIDAB in the last 72 hours.  Invalid input(s): FREET3 Anemia work up: No results for input(s): VITAMINB12, FOLATE, FERRITIN, TIBC, IRON, RETICCTPCT in the last 72 hours. Sepsis Labs: Recent Labs  Lab 09/17/18 0850 09/17/18 1417 09/18/18 0356  WBC 15.7*  --  10.7*  LATICACIDVEN  --  1.46  --     Microbiology Recent Results (from the past 240 hour(s))  Culture, blood (Routine X 2) w Reflex to ID Panel     Status: None (Preliminary result)   Collection Time: 09/17/18  2:00 PM  Result Value Ref Range Status   Specimen Description BLOOD RIGHT ANTECUBITAL  Final   Special Requests   Final    BOTTLES DRAWN AEROBIC AND ANAEROBIC Blood Culture adequate volume   Culture   Final    NO GROWTH < 24 HOURS Performed at [facility], [address] [place], [place] [address]    Report Status PENDING  Incomplete  Culture, blood (Routine X 2) w Reflex to ID Panel     Status: None (Preliminary result)   Collection Time: 09/17/18  2:15 PM  Result Value Ref Range Status   Specimen Description BLOOD LEFT ANTECUBITAL  Final   Special Requests   Final    BOTTLES DRAWN AEROBIC AND ANAEROBIC Blood Culture adequate volume   Culture   Final    NO GROWTH <  24 HOURS Performed at [facility], [place] [address] [place], [place] [address]    Report Status PENDING  Incomplete    Procedures and diagnostic studies:  Dg Chest 2 View  Result Date: 09/17/2018 CLINICAL DATA:  Dizziness and weakness for few days, vomited on Saturday morning, legs gave out, history atrial fibrillation, CHF, COPD, coronary artery disease post MI and CABG, type II diabetes mellitus EXAM: CHEST - 2 VIEW COMPARISON:  08/03/2018 FINDINGS: LEFT subclavian AICD with leads projecting at RIGHT atrium, RIGHT ventricle, and coronary sinus. Enlargement of cardiac silhouette. Mediastinal contours and pulmonary vascularity normal. Atherosclerotic calcification aorta. RIGHT basilar atelectasis. Accentuated markings in the upper lobes slightly  greater on RIGHT, unchanged. No acute infiltrate or pneumothorax. Tiny pleural effusions blunt the posterior costophrenic angles. Bones demineralized with chronic compression fracture of a vertebra at the thoracolumbar junction. IMPRESSION: Enlargement of cardiac silhouette post CABG and AICD. Bibasilar atelectasis greater on RIGHT with tiny BILATERAL pleural effusions. Electronically Signed   By: Lavonia Dana M.D.   On: 09/17/2018 09:19   Ct Chest W Contrast  Result Date: 09/17/2018 CLINICAL DATA:  84 year old male with persistent cough and shortness of breath. EXAM: CT CHEST WITH CONTRAST TECHNIQUE: Multidetector CT imaging of the chest was performed during intravenous contrast administration. CONTRAST:  86mL OMNIPAQUE IOHEXOL 300 MG/ML  SOLN COMPARISON:  01/15/2016 CT FINDINGS: Cardiovascular: Cardiomegaly, CABG changes and ICD again noted. Aortic atherosclerotic scratch de coronary artery and aortic atherosclerotic calcifications again noted. There is no evidence of thoracic aortic aneurysm or dissection. No pericardial effusion. Mediastinum/Nodes: No enlarged mediastinal, hilar, or axillary lymph nodes. Thyroid gland, trachea, and esophagus  demonstrate no significant findings. Lungs/Pleura: A 1.1 x 1.3 x 1.9 cm irregular LEFT UPPER lobe nodule (series 4:39) is identified. Mild ground-glass/airspace/tree-in-bud opacities within the anterior RIGHT UPPER lobe, posterior RIGHT UPPER lobe and within the RIGHT LOWER lobe identified likely representing infection. Moderate centrilobular emphysema is identified. Mild peribronchial thickening is present. No pleural effusion or pneumothorax. Upper Abdomen: A 1.8 cm gallstone is present. No CT evidence of acute cholecystitis. Musculoskeletal: No acute or suspicious bony abnormalities noted. Remote rib fractures and L1 fracture again noted. IMPRESSION: 1. Irregular 1.1 x 1.3 x 1.9 cm LEFT UPPER lobe nodule worrisome for malignancy. Consider one of the following for both low-risk and high-risk individuals: (a) repeat chest CT in 3 months, (b) follow-up PET-CT, or (c) tissue sampling. This recommendation follows the consensus statement: Guidelines for Management of Incidental Pulmonary Nodules Detected on CT Images: From the Fleischner Society 2017; Radiology 2017; 284:228-243. 2. Mild ground-glass/airspace/tree-in-bud opacities within the RIGHT UPPER and LOWER lobe likely representing infection. 3. Cardiomegaly and coronary artery disease.  Status post CABG. 4. Cholelithiasis 5. Aortic Atherosclerosis (ICD10-I70.0) and Emphysema (ICD10-J43.9). Electronically Signed   By: Margarette Canada M.D.   On: 09/17/2018 11:41    Medications:   . amiodarone  200 mg Oral Daily  . atorvastatin  40 mg Oral q1800  . bisacodyl  10 mg Oral Q1200  . carvedilol  6.25 mg Oral BID WC  . ezetimibe  10 mg Oral Daily  . ferrous sulfate  325 mg Oral QPM  . fluticasone  1 spray Each Nare Daily  . insulin aspart  0-5 Units Subcutaneous QHS  . insulin aspart  0-9 Units Subcutaneous TID WC  . insulin glargine  15 Units Subcutaneous Q2200  . levothyroxine  100 mcg Oral QAC breakfast  . senna  2 tablet Oral QHS  . warfarin  5 mg Oral  ONCE-1800  . Warfarin - Pharmacist Dosing Inpatient   Does not apply q1800   Continuous Infusions: . azithromycin    . cefTRIAXone (ROCEPHIN)  IV       LOS: 0 days   Geradine Girt  [facility]   *Please refer to [email or web address], password [email or web address] to get updated schedule on who will round on this patient, as hospitalists switch teams weekly. If 7PM-7AM, please contact night-coverage at [email or web address], password [email or web address] for any overnight needs.  09/18/2018, 12:59 PM

## 2018-09-18 NOTE — Progress Notes (Signed)
ANTICOAGULATION CONSULT NOTE - [place] for warfarin Indication: atrial fibrillation  Allergies  Allergen Reactions  . Aldactone [Spironolactone] Other (See Comments)    Hyperkalemia  reported by Dr. [name] - pt is currently taking 12.5 mg daily -November 2017 per medication list by same MD    Patient Measurements: Height: 5\' 8"  (172.7 cm) Weight: 189 lb (85.7 kg) IBW/kg (Calculated) : 68.4  Vital Signs: Temp: 97.8 F (36.6 C) (11/25 0737) Temp Source: Oral (11/25 0737) BP: 114/54 (11/25 0833) Pulse Rate: 87 (11/25 0737)  Labs: Recent Labs    09/17/18 0850 09/18/18 0356  HGB 12.8* 13.0  HCT 41.2 40.8  PLT 197 183  LABPROT 28.2* 26.9*  INR 2.69 2.53  CREATININE 1.26*  --     Estimated Creatinine Clearance: 48.1 mL/min (A) (by C-G formula based on SCr of 1.26 mg/dL (H)).   Medical History: Past Medical History:  Diagnosis Date  . Atrial fibrillation ([place])    persistent, previously seen at [facility] and placed on amiodarone  . Benign prostatic hypertrophy   . CAD (coronary artery disease)    multivessel s/p inferolateral wall MI with subsequent CABG 11/1998.  Cath 2009 with Patent grafts  . Cleft palate   . COPD with emphysema ([place]) 04/01/2010  . DM (diabetes mellitus), type 2 ([place])   . Dyspnea   . GERD (gastroesophageal reflux disease)   . HTN (hypertension)   . Hyperlipidemia   . Hypothyroidism   . Iron deficiency anemia   . Ischemic dilated cardiomyopathy ([place])    EF 35-40% by MUGA 6/11  . Myocardial infarction ([place])   . Nasal septal deviation   . Nephrolithiasis   . OSA (obstructive sleep apnea)   . PAF (paroxysmal atrial fibrillation) ([place])   . Peripheral arterial disease (HCC)    left subclavian artery stenosis  . PNA (pneumonia)   . Psoriasis   . Seborrheic keratosis   . Stroke ([place])   . Systolic congestive heart failure ([place]) 2009   s/p BiV ICD implantation by Dr Leonia Reeves (MDT)    Medications:  Scheduled:  .  amiodarone  200 mg Oral Daily  . atorvastatin  40 mg Oral q1800  . bisacodyl  10 mg Oral Q1200  . carvedilol  6.25 mg Oral BID WC  . ezetimibe  10 mg Oral Daily  . ferrous sulfate  325 mg Oral QPM  . fluticasone  1 spray Each Nare Daily  . insulin aspart  0-5 Units Subcutaneous QHS  . insulin aspart  0-9 Units Subcutaneous TID WC  . insulin glargine  15 Units Subcutaneous Q2200  . levothyroxine  100 mcg Oral QAC breakfast  . senna  2 tablet Oral QHS  . Warfarin - Pharmacist Dosing Inpatient   Does not apply q1800   Assessment: 84 yo M on warfarin PTA for afib, INR therapeutic on admission at 2.69, CBC wnl.   PTA dosing: 2.5mg  Wed and Sat, 5mg  all other days  INR remains therapeutic.  Continue home regimen.   Goal of Therapy:  INR 2-3 Monitor platelets by anticoagulation protocol: Yes   Plan:  Warfarin 5mg  PO x 1 tonight Daily INR, CBC, s/s/ bleeding  [employer], Pharm.D., BCPS Clinical Pharmacist Pager: [phone] Clinical phone for 09/18/2018 from 8:30-4:00 is [phone].  **Pharmacist phone directory can now be found on [email or web address] (PW [email or web address]).  Listed under [place].  09/18/2018 10:36 AM

## 2018-09-18 NOTE — Progress Notes (Signed)
Per charge nurse, tele called and reported 13 beat run v tach. K yesterday 3.8, mag has not been drawn. BMP placed. MD on call paged to make aware. Pt otherwise resting comfortably, asymptomatic.

## 2018-09-18 NOTE — Evaluation (Signed)
Physical Therapy Evaluation Patient Details Name: [name] MRN: [id number] DOB: 02/01/1934 Today's Date: 09/18/2018   History of Present Illness  Patient is an 84 y/o male presenting to the ED on 09/17/18 with primary complaints of weakness. Past medical history significant of persistent A. fib on amiodarone and anticoagulated with Coumadin, coronary artery disease status post CABG, COPD with history of tobacco use, hypertension, hyperlipidemia, hypothyroidism, chronic systolic CHF. Underwent chest x-ray followed by CT scan of the chest which showed groundglass opacities within the right upper and right lower lobes likely representing infection.    Clinical Impression  [name] is a very pleasant 84 y/o male admitted with the above listed diagnosis. Patient reports that prior to admission he was Mod I with mobility with rollator vs SPC. Patient today performing all mobility at general supervision level assist with no physical assist provided for steadying. No further acute PT needs identified. Do not anticipate PT needs at discharge. PT to sign off. Thanks for the referral.      Follow Up Recommendations No PT follow up    Equipment Recommendations  None recommended by PT    Recommendations for Other Services       Precautions / Restrictions Precautions Precautions: Fall Restrictions Weight Bearing Restrictions: No      Mobility  Bed Mobility Overal bed mobility: Modified Independent                Transfers Overall transfer level: Modified independent Equipment used: Rolling walker (2 wheeled)             General transfer comment: sit to stand at bedside without extra effort; no LOB  Ambulation/Gait Ambulation/Gait assistance: Supervision;Modified independent (Device/Increase time) Gait Distance (Feet): 250 Feet Assistive device: Rolling walker (2 wheeled) Gait Pattern/deviations: Decreased stride length;Step-through pattern Gait velocity:  decreased   General Gait Details: cautious pace of gait - good obstacle navigation  Stairs            Wheelchair Mobility    Modified Rankin (Stroke Patients Only)       Balance Overall balance assessment: Mild deficits observed, not formally tested                                           Pertinent Vitals/Pain Pain Assessment: No/denies pain    Home Living Family/patient expects to be discharged to:: Private residence Living Arrangements: Spouse/significant other Available Help at Discharge: Family;Available 24 hours/day Type of Home: House Home Access: Stairs to enter Entrance Stairs-Rails: Left Entrance Stairs-Number of Steps: 3 Home Layout: One level Home Equipment: Walker - 4 wheels;Cane - single point;Shower seat - built in;Grab bars - tub/shower      Prior Function Level of Independence: Needs assistance   Gait / Transfers Assistance Needed: rollator for community distances, cane in the house           Hand Dominance        Extremity/Trunk Assessment   Upper Extremity Assessment Upper Extremity Assessment: Overall WFL for tasks assessed    Lower Extremity Assessment Lower Extremity Assessment: Overall WFL for tasks assessed    Cervical / Trunk Assessment Cervical / Trunk Assessment: Kyphotic  Communication   Communication: No difficulties  Cognition Arousal/Alertness: Awake/alert Behavior During Therapy: WFL for tasks assessed/performed Overall Cognitive Status: Within Functional Limits for tasks assessed  General Comments General comments (skin integrity, edema, etc.): wife present and supportive    Exercises     Assessment/Plan    PT Assessment Patent does not need any further PT services  PT Problem List         PT Treatment Interventions      PT Goals (Current goals can be found in the Care Plan section)  Acute Rehab PT Goals Patient Stated Goal:  return home tomorrow PT Goal Formulation: With patient Time For Goal Achievement: 09/18/18 Potential to Achieve Goals: Good    Frequency     Barriers to discharge        Co-evaluation               AM-PAC PT "6 Clicks" Mobility  Outcome Measure Help needed turning from your back to your side while in a flat bed without using bedrails?: A Little Help needed moving from lying on your back to sitting on the side of a flat bed without using bedrails?: A Little Help needed moving to and from a bed to a chair (including a wheelchair)?: A Little Help needed standing up from a chair using your arms (e.g., wheelchair or bedside chair)?: None Help needed to walk in hospital room?: None Help needed climbing 3-5 steps with a railing? : A Little 6 Click Score: 20    End of Session Equipment Utilized During Treatment: Gait belt Activity Tolerance: Patient tolerated treatment well Patient left: in bed;with call bell/phone within reach;with nursing/sitter in room;with family/visitor present Nurse Communication: Mobility status PT Visit Diagnosis: Unsteadiness on feet (R26.81)    Time: 9030-0923 PT Time Calculation (min) (ACUTE ONLY): 14 min   Charges:   PT Evaluation $PT Eval Moderate Complexity: 1 Mod          Lanney Gins, PT, DPT Supplemental Physical Therapist 09/18/18 3:10 PM Pager: [phone] Office: [phone]

## 2018-09-19 ENCOUNTER — Inpatient Hospital Stay (HOSPITAL_COMMUNITY): Payer: PPO

## 2018-09-19 DIAGNOSIS — I5022 Chronic systolic (congestive) heart failure: Secondary | ICD-10-CM

## 2018-09-19 LAB — GLUCOSE, CAPILLARY
Glucose-Capillary: 74 mg/dL (ref 70–99)
Glucose-Capillary: 114 mg/dL — ABNORMAL HIGH (ref 70–99)
Glucose-Capillary: 110 mg/dL — ABNORMAL HIGH (ref 70–99)
Glucose-Capillary: 114 mg/dL — ABNORMAL HIGH (ref 70–99)

## 2018-09-19 LAB — CBC
HCT: 38.1 % — ABNORMAL LOW (ref 39.0–52.0)
Hemoglobin: 12.2 g/dL — ABNORMAL LOW (ref 13.0–17.0)
MCH: 30.2 pg (ref 26.0–34.0)
MCHC: 32 g/dL (ref 30.0–36.0)
MCV: 94.3 fL (ref 80.0–100.0)
Platelets: 177 10*3/uL (ref 150–400)
RBC: 4.04 MIL/uL — ABNORMAL LOW (ref 4.22–5.81)
RDW: 13.9 % (ref 11.5–15.5)
WBC: 8 10*3/uL (ref 4.0–10.5)
nRBC: 0 % (ref 0.0–0.2)

## 2018-09-19 LAB — PROTIME-INR
INR: 2.9
Prothrombin Time: 29.9 seconds — ABNORMAL HIGH (ref 11.4–15.2)

## 2018-09-19 LAB — BASIC METABOLIC PANEL
Anion gap: 7 (ref 5–15)
BUN: 17 mg/dL (ref 8–23)
CO2: 25 mmol/L (ref 22–32)
Calcium: 8.6 mg/dL — ABNORMAL LOW (ref 8.9–10.3)
Chloride: 107 mmol/L (ref 98–111)
Creatinine, Ser: 1.37 mg/dL — ABNORMAL HIGH (ref 0.61–1.24)
GFR calc Af Amer: 54 mL/min — ABNORMAL LOW (ref >60)
GFR calc non Af Amer: 46 mL/min — ABNORMAL LOW (ref >60)
Glucose, Bld: 75 mg/dL (ref 70–99)
Potassium: 3.6 mmol/L (ref 3.5–5.1)
Sodium: 139 mmol/L (ref 135–145)

## 2018-09-19 LAB — EXPECTORATED SPUTUM ASSESSMENT W GRAM STAIN, RFLX TO RESP C

## 2018-09-19 MED ORDER — IPRATROPIUM-ALBUTEROL 0.5-2.5 (3) MG/3ML IN SOLN
3.0000 mL | Freq: Three times a day (TID) | RESPIRATORY_TRACT | Status: DC
  Filled 2018-09-19: qty 3

## 2018-09-19 MED ORDER — WARFARIN SODIUM 1 MG PO TABS
1.0000 mg | ORAL_TABLET | Freq: Once | ORAL | Status: AC
  Administered 2018-09-19: 1 mg via ORAL
  Filled 2018-09-19: qty 1

## 2018-09-19 MED ORDER — PERFLUTREN LIPID MICROSPHERE
1.0000 mL | INTRAVENOUS | Status: AC | PRN
  Administered 2018-09-19: 3 mL via INTRAVENOUS
  Filled 2018-09-19: qty 10

## 2018-09-19 MED ORDER — GUAIFENESIN ER 600 MG PO TB12
600.0000 mg | ORAL_TABLET | Freq: Two times a day (BID) | ORAL | Status: DC
  Administered 2018-09-19 – 2018-09-20 (×2): 600 mg via ORAL
  Filled 2018-09-19 (×2): qty 1

## 2018-09-19 MED ORDER — POTASSIUM CHLORIDE CRYS ER 20 MEQ PO TBCR
40.0000 meq | EXTENDED_RELEASE_TABLET | Freq: Once | ORAL | Status: AC
  Administered 2018-09-19: 40 meq via ORAL
  Filled 2018-09-19: qty 2

## 2018-09-19 NOTE — Progress Notes (Signed)
Pt ambulated on a hallway fine twice this shift.  Denied SOB.  Idolina Primer, RN

## 2018-09-19 NOTE — Progress Notes (Signed)
  Echocardiogram 2D Echocardiogram has been performed.  [name] 09/19/2018, 3:55 PM

## 2018-09-19 NOTE — Care Management Note (Signed)
Case Management Note  Patient Details  Name: BURL TAUZIN MRN: [id number] Date of Birth: Mar 07, 1934  Subjective/Objective:  From home with spouse, presents with mutifocal HCAP, ckd3, paf (coumadin pta), copd/emphysema, dm, chronic syst chf, hypothyroidism, hld. No needs at this time.                   Action/Plan: DC home when ready.  Expected Discharge Date:                  Expected Discharge Plan:  Home/Self Care  In-House Referral:     Discharge planning Services  CM Consult  Post Acute Care Choice:    Choice offered to:     DME Arranged:    DME Agency:     HH Arranged:    HH Agency:     Status of Service:  In process, will continue to follow  If discussed at Long Length of Stay Meetings, dates discussed:    Additional Comments:  Zenon Mayo, RN 09/19/2018, 4:07 PM

## 2018-09-19 NOTE — Progress Notes (Signed)
ANTICOAGULATION CONSULT NOTE - [place] for warfarin Indication: atrial fibrillation  Allergies  Allergen Reactions  . Aldactone [Spironolactone] Other (See Comments)    Hyperkalemia  reported by Dr. [name] - pt is currently taking 12.5 mg daily -November 2017 per medication list by same MD    Patient Measurements: Height: 5\' 8"  (172.7 cm) Weight: 189 lb (85.7 kg) IBW/kg (Calculated) : 68.4  Vital Signs: Temp: 98 F (36.7 C) (11/26 0814) BP: 132/78 (11/26 0814) Pulse Rate: 91 (11/26 0814)  Labs: Recent Labs    09/17/18 0850 09/18/18 0356 09/18/18 1110 09/19/18 0307  HGB 12.8* 13.0  --  12.2*  HCT 41.2 40.8  --  38.1*  PLT 197 183  --  177  LABPROT 28.2* 26.9*  --  29.9*  INR 2.69 2.53  --  2.90  CREATININE 1.26*  --  1.56* 1.37*    Estimated Creatinine Clearance: 44.3 mL/min (A) (by C-G formula based on SCr of 1.37 mg/dL (H)).   Medical History: Past Medical History:  Diagnosis Date  . Atrial fibrillation ([place])    persistent, previously seen at [facility] and placed on amiodarone  . Benign prostatic hypertrophy   . CAD (coronary artery disease)    multivessel s/p inferolateral wall MI with subsequent CABG 11/1998.  Cath 2009 with Patent grafts  . Cleft palate   . COPD with emphysema ([place]) 04/01/2010  . DM (diabetes mellitus), type 2 ([place])   . Dyspnea   . GERD (gastroesophageal reflux disease)   . HTN (hypertension)   . Hyperlipidemia   . Hypothyroidism   . Iron deficiency anemia   . Ischemic dilated cardiomyopathy ([place])    EF 35-40% by MUGA 6/11  . Myocardial infarction ([place])   . Nasal septal deviation   . Nephrolithiasis   . OSA (obstructive sleep apnea)   . PAF (paroxysmal atrial fibrillation) ([place])   . Peripheral arterial disease (HCC)    left subclavian artery stenosis  . PNA (pneumonia)   . Psoriasis   . Seborrheic keratosis   . Stroke ([place])   . Systolic congestive heart failure ([place]) 2009   s/p BiV ICD implantation  by Dr Leonia Reeves (MDT)    Medications:  Scheduled:  . amiodarone  200 mg Oral Daily  . atorvastatin  40 mg Oral q1800  . bisacodyl  10 mg Oral Q1200  . carvedilol  6.25 mg Oral BID WC  . ezetimibe  10 mg Oral Daily  . ferrous sulfate  325 mg Oral QPM  . fluticasone  1 spray Each Nare Daily  . insulin aspart  0-5 Units Subcutaneous QHS  . insulin aspart  0-9 Units Subcutaneous TID WC  . insulin glargine  15 Units Subcutaneous Q2200  . levothyroxine  100 mcg Oral QAC breakfast  . senna  2 tablet Oral QHS  . Warfarin - Pharmacist Dosing Inpatient   Does not apply q1800   Assessment: 84 yo M on warfarin PTA for afib, INR therapeutic on admission at 2.69, CBC wnl.   PTA dosing: 2.5mg  Wed and Sat, 5mg  all other days  INR remains therapeutic but trending up possible related to DDI with azithromycin.  Will empirically reduce dose.  Goal of Therapy:  INR 2-3 Monitor platelets by anticoagulation protocol: Yes   Plan:  Warfarin 1mg  PO x 1 tonight Daily INR, CBC, s/s/ bleeding  [employer], Pharm.D., BCPS Clinical Pharmacist Pager: [phone] Clinical phone for 09/19/2018 from 8:30-4:00 is [phone].  **Pharmacist phone directory can now be  found on [email or web address] (PW [email or web address]).  Listed under [place].  09/19/2018 2:22 PM

## 2018-09-19 NOTE — Progress Notes (Signed)
PROGRESS NOTE  [name] ATF:[id number] DOB: August 22, 1934 DOA: 09/17/2018 PCP: [name], MD  HPI/Recap of past 24 hours:  Denies pain, no fever, no edema, reports feeling better Continue to have productive cough, scattered wheezing and crackles on exam Cr improving  Assessment/Plan: Active Problems:   HCAP (healthcare-associated pneumonia)  Multifocal pneumonia -was recently in hospital but want to avoid nephrotoxic agents such as vanc due to AKI -no fever- improving WBC , abc changed to IV rocephin/azithromycin -blood Culture no growth, urine strep pneumo antigen is negative, sputum culture pending collection -doubt post obstructive as nodule on left and pneumonia on right -? Aspiration as he did vomit x 1 -Still have productive cough, lung exam with scattered wheezing and crackles will repeat chest x-ray get echocardiogram  Left upper lobe nodule -Seen on the CT scan, 1.1 x 1.3 x 1.9 cm, this will need to be further worked up as an outpatient  AKI on CKD 3  -concern as he was given contrast with CT Scan -avoid nephrotoxic agents- d/c vanc  -renal U/S "No obstructive uropathy.  " -bladder scan to r/o obstruction -holding diuretics for now -was also hypotensive so could be from this as well -improving  Paroxysmal A. fib -Continue anticoagulation with Coumadin- pharmacy consult -resume coreg/amiodarone  Runs of v-tach -Mg ok -replace k today to keep k>4 -restart coreg and monitor  Insulin dependent Diabetes mellitus -Most recent A1c in our system in April 2017 was 7.7 showing uncontrolled diabetes -Resume home medications, place on home Tresiba at a lower dose, placed on sliding scale  Chronic systolic CHF s/p biv ICD in 2015 -Most recent 2D echo in 2017 with EF of 35-40% -recent change of lasix on 11/21 from 40 mg BID to 80 mg BID (was taking incorrectly)  Hypothyroidism -Continue Synthroid  Hyperlipidemia -Continue atorvastatin, continue  Zetia  Code Status: full  Family Communication: patient   Disposition Plan: likely home in am, if lung exam improved, cr continue to improve, echo/cxr ok   Consultants:  none  Procedures:  none  Antibiotics:  As above   Objective: BP 132/78 (BP Location: Right Arm)   Pulse 91   Temp 98 F (36.7 C)   Resp 19   Ht 5\' 8"  (1.727 m)   Wt 85.7 kg   SpO2 97%   BMI 28.74 kg/m   Intake/Output Summary (Last 24 hours) at 09/19/2018 1010 Last data filed at 09/19/2018 0800 Gross per 24 hour  Intake 480 ml  Output -  Net 480 ml   Filed Weights   09/17/18 0753  Weight: 85.7 kg    Exam: Patient is examined daily including today on 09/19/2018, exams remain the same as of yesterday except that has changed    General:  NAD  Cardiovascular: RRR  Respiratory: mild scattered wheezes and crackles  Abdomen: Soft/ND/NT, positive BS  Musculoskeletal: No Edema  Neuro: alert, oriented   Data Reviewed: Basic Metabolic Panel: Recent Labs  Lab 09/17/18 0850 09/18/18 1110 09/19/18 0307  NA 135 138 139  K 3.8 4.0 3.6  CL 102 107 107  CO2 25 24 25   GLUCOSE 109* 130* 75  BUN 14 20 17   CREATININE 1.26* 1.56* 1.37*  CALCIUM 8.7* 8.5* 8.6*  MG  --  1.9  --    Liver Function Tests: Recent Labs  Lab 09/17/18 0850  AST 18  ALT 10  ALKPHOS 59  BILITOT 0.8  PROT 6.2*  ALBUMIN 3.3*   Recent Labs  Lab 09/17/18 0850  LIPASE 26   No results for input(s): AMMONIA in the last 168 hours. CBC: Recent Labs  Lab 09/17/18 0850 09/18/18 0356 09/19/18 0307  WBC 15.7* 10.7* 8.0  NEUTROABS 12.9*  --   --   HGB 12.8* 13.0 12.2*  HCT 41.2 40.8 38.1*  MCV 96.7 93.8 94.3  PLT 197 183 177   Cardiac Enzymes:   No results for input(s): CKTOTAL, CKMB, CKMBINDEX, TROPONINI in the last 168 hours. BNP (last 3 results) Recent Labs    07/14/18 0029 09/17/18 0850  BNP 651.2* 419.7*    ProBNP (last 3 results) Recent Labs    02/27/18 1045 03/06/18 0752 03/13/18 0805    PROBNP 10,884* 12,873* 5,305*    CBG: Recent Labs  Lab 09/18/18 0734 09/18/18 1143 09/18/18 1639 09/18/18 2149 09/19/18 0737  GLUCAP 106* 103* 104* 113* 74    Recent Results (from the past 240 hour(s))  Culture, blood (Routine X 2) w Reflex to ID Panel     Status: None (Preliminary result)   Collection Time: 09/17/18  2:00 PM  Result Value Ref Range Status   Specimen Description BLOOD RIGHT ANTECUBITAL  Final   Special Requests   Final    BOTTLES DRAWN AEROBIC AND ANAEROBIC Blood Culture adequate volume   Culture   Final    NO GROWTH 2 DAYS Performed at [facility], [place] [address], [place], [place] [address]    Report Status PENDING  Incomplete  Culture, blood (Routine X 2) w Reflex to ID Panel     Status: None (Preliminary result)   Collection Time: 09/17/18  2:15 PM  Result Value Ref Range Status   Specimen Description BLOOD LEFT ANTECUBITAL  Final   Special Requests   Final    BOTTLES DRAWN AEROBIC AND ANAEROBIC Blood Culture adequate volume   Culture   Final    NO GROWTH 2 DAYS Performed at [facility], [place] [address], [place], [place] [address]    Report Status PENDING  Incomplete     Studies: US Renal  Result Date: 09/19/2018 CLINICAL DATA:  Acute kidney injury. EXAM: RENAL / URINARY TRACT ULTRASOUND COMPLETE COMPARISON:  Abdominopelvic CT 05/05/2018 FINDINGS: Right Kidney: Renal measurements: 10.7 x 6.4 x 5.6 cm = volume: 199.9 mL. Slight renal cortical thinning. Echogenicity within normal limits. No mass or hydronephrosis visualized. Left Kidney: Renal measurements: 9.9 x 5.7 x 5.2 cm = volume: 152.6 mL. Slight renal cortical thinning. Echogenicity within normal limits. No mass or hydronephrosis visualized. Bladder: Appears normal for degree of bladder distention. IMPRESSION: No obstructive uropathy.  No explanation for acute kidney injury. Electronically Signed   By: Keith Rake M.D.   On: 09/19/2018 05:44    Scheduled Meds: . amiodarone   200 mg Oral Daily  . atorvastatin  40 mg Oral q1800  . bisacodyl  10 mg Oral Q1200  . carvedilol  6.25 mg Oral BID WC  . ezetimibe  10 mg Oral Daily  . ferrous sulfate  325 mg Oral QPM  . fluticasone  1 spray Each Nare Daily  . insulin aspart  0-5 Units Subcutaneous QHS  . insulin aspart  0-9 Units Subcutaneous TID WC  . insulin glargine  15 Units Subcutaneous Q2200  . levothyroxine  100 mcg Oral QAC breakfast  . potassium chloride  40 mEq Oral Once  . senna  2 tablet Oral QHS  . Warfarin - Pharmacist Dosing Inpatient   Does not apply q1800    Continuous Infusions: . azithromycin  500 mg (09/18/18 1507)  . cefTRIAXone (ROCEPHIN)  IV 1 g (09/18/18 1503)     Time spent: 73mins I have personally reviewed and interpreted on  09/19/2018 daily labs, tele strips, imagings as discussed above under date review session and assessment and plans.  I reviewed all nursing notes, pharmacy notes, vitals, pertinent old records  I have discussed plan of care as described above with RN , patient  on 09/19/2018   Florencia Reasons MD, PhD  [facility] Pager [phone]. If 7PM-7AM, please contact night-coverage at [email or web address], password [facility] 09/19/2018, 10:10 AM  LOS: 1 day

## 2018-09-20 ENCOUNTER — Inpatient Hospital Stay (HOSPITAL_COMMUNITY): Payer: PPO

## 2018-09-20 DIAGNOSIS — N179 Acute kidney failure, unspecified: Secondary | ICD-10-CM

## 2018-09-20 DIAGNOSIS — I482 Chronic atrial fibrillation, unspecified: Secondary | ICD-10-CM

## 2018-09-20 DIAGNOSIS — E039 Hypothyroidism, unspecified: Secondary | ICD-10-CM

## 2018-09-20 LAB — BASIC METABOLIC PANEL
Anion gap: 5 (ref 5–15)
BUN: 13 mg/dL (ref 8–23)
CO2: 26 mmol/L (ref 22–32)
Calcium: 8.7 mg/dL — ABNORMAL LOW (ref 8.9–10.3)
Chloride: 109 mmol/L (ref 98–111)
Creatinine, Ser: 1.19 mg/dL (ref 0.61–1.24)
GFR calc Af Amer: >60 mL/min (ref >60)
GFR calc non Af Amer: 57 mL/min — ABNORMAL LOW (ref >60)
Glucose, Bld: 82 mg/dL (ref 70–99)
Potassium: 4 mmol/L (ref 3.5–5.1)
Sodium: 140 mmol/L (ref 135–145)

## 2018-09-20 LAB — HEMOGLOBIN A1C
Hgb A1c MFr Bld: 6.5 % — ABNORMAL HIGH (ref 4.8–5.6)
Mean Plasma Glucose: 139.85 mg/dL

## 2018-09-20 LAB — PROTIME-INR
INR: 2.87
Prothrombin Time: 29.6 seconds — ABNORMAL HIGH (ref 11.4–15.2)

## 2018-09-20 LAB — ECHOCARDIOGRAM COMPLETE
Height: 68 in
Weight: 3024 oz

## 2018-09-20 LAB — CBC
HCT: 37.4 % — ABNORMAL LOW (ref 39.0–52.0)
Hemoglobin: 12.2 g/dL — ABNORMAL LOW (ref 13.0–17.0)
MCH: 30.8 pg (ref 26.0–34.0)
MCHC: 32.6 g/dL (ref 30.0–36.0)
MCV: 94.4 fL (ref 80.0–100.0)
Platelets: 167 10*3/uL (ref 150–400)
RBC: 3.96 MIL/uL — ABNORMAL LOW (ref 4.22–5.81)
RDW: 13.8 % (ref 11.5–15.5)
WBC: 7.1 10*3/uL (ref 4.0–10.5)
nRBC: 0 % (ref 0.0–0.2)

## 2018-09-20 LAB — GLUCOSE, CAPILLARY
Glucose-Capillary: 68 mg/dL — ABNORMAL LOW (ref 70–99)
Glucose-Capillary: 83 mg/dL (ref 70–99)
Glucose-Capillary: 118 mg/dL — ABNORMAL HIGH (ref 70–99)

## 2018-09-20 LAB — MAGNESIUM: Magnesium: 2.1 mg/dL (ref 1.7–2.4)

## 2018-09-20 MED ORDER — IPRATROPIUM-ALBUTEROL 0.5-2.5 (3) MG/3ML IN SOLN: 3.0000 mL | Freq: Four times a day (QID) | RESPIRATORY_TRACT | Status: DC | PRN

## 2018-09-20 MED ORDER — CEFDINIR 300 MG PO CAPS: 300.0000 mg | ORAL_CAPSULE | Freq: Two times a day (BID) | ORAL | 0 refills | Status: AC

## 2018-09-20 NOTE — Discharge Summary (Signed)
Physician Discharge Summary  [name] NWG:[id number] DOB: 16-May-1934 DOA: 09/17/2018  PCP: [name], MD  Admit date: 09/17/2018 Discharge date: 09/20/2018  Admitted From: home Disposition:  home  Recommendations for Outpatient Follow-up:  1. Follow up with PCP in 1-2 weeks 2. Continue antibiotics for 4 more days  Home Health: none Equipment/Devices: none  Discharge Condition: stable CODE STATUS: Full code Diet recommendation: heart healhy  HPI:  [name] is a 84 y.o. male with medical history significant of persistent A. fib on amiodarone and anticoagulated with Coumadin, coronary artery disease status post CABG, COPD with history of tobacco use, hypertension, hyperlipidemia, hypothyroidism, chronic systolic CHF, who presents to the hospital with a chief complaint of generalized weakness over the last couple of days.  Patient also had an episode of nausea and vomiting x1 yesterday morning.  He has been complaining of a cough (he does have a chronic cough), felt a little bit worse as well as with clear sputum production over the last couple of days.  He denies any fever or chills, denies any sore throat or muscle aches.  He denies any chest pains, denies any palpitations.  No abdominal pain, currently denies any nausea or any further emesis episodes.  He has no diarrhea.  Hospital Course:  Principal problem Multifocal pneumonia -patient was admitted to the hospital with multifocal pneumonia.  He was placed on IV antibiotics with ceftriaxone azithromycin, clinically improved, he is afebrile, no leukocytosis, stable on room air.  Patient able to ambulate in the hallway without significant difficulties, will change his antibiotics to [facility] and will be discharged home in stable condition. Cultures without any significant growth  Additional problems Left upper lobe nodule -Seen on the CT scan, 1.1 x 1.3 x 1.9 cm, this will need to be further worked up as an  outpatient AKI onCKD 3 -creatinine improved and currently at baseline.  Continue to monitor as an outpatient Paroxysmal A. Fib -Continue anticoagulation with Coumadin, continue medications Runs of v-tach -Mg ok, improved, continue coreg. Echo as below Insulin dependent Diabetes mellitus -Most recent A1c in our system in April 2017 was 7.7 showing uncontrolled diabetes Chronic systolic CHF s/p biv ICD in 2015 -Most recent 2D echo in 2017 with EF of 35-40%.  Updated 2D echo as below with similar EF Hypothyroidism -Continue Synthroid Hyperlipidemia -Continue atorvastatin, continue Zetia  Discharge Diagnoses:  Active Problems:   HCAP (healthcare-associated pneumonia)  Discharge Instructions   Allergies as of 09/20/2018      Reactions   Aldactone [spironolactone] Other (See Comments)   Hyperkalemia  reported by Dr. [name] - pt is currently taking 12.5 mg daily -November 2017 per medication list by same MD      Medication List    STOP taking these medications   insulin degludec 100 UNIT/ML Sopn FlexTouch Pen Commonly known as:  TRESIBA   SPIRIVA RESPIMAT 2.5 MCG/ACT Aers Generic drug:  Tiotropium Bromide Monohydrate     TAKE these medications   albuterol 108 (90 Base) MCG/ACT inhaler Commonly known as:  PROVENTIL HFA;VENTOLIN HFA Inhale 2 puffs into the lungs every 6 (six) hours as needed for wheezing or shortness of breath. Must keep June 2016 appt. What changed:  Another medication with the same name was changed. Make sure you understand how and when to take each.   albuterol (2.5 MG/3ML) 0.083% nebulizer solution Commonly known as:  PROVENTIL USE 1 VIAL VIA NEBULIZER 3 TIMES A DAY AS DIRECTED. DX:J44.9 What changed:  See the  new instructions.   amiodarone 400 MG tablet Commonly known as:  PACERONE TAKE HALF TABLET (200 MG TOTAL) BY MOUTH DAILY. What changed:  See the new instructions.   atorvastatin 80 MG tablet Commonly known as:  LIPITOR TAKE 1 TABLET (80 MG  TOTAL) BY MOUTH DAILY. What changed:  See the new instructions.   bisacodyl 5 MG EC tablet Commonly known as:  DULCOLAX Take 2 tablets (10 mg total) by mouth daily at 12 noon. What changed:    when to take this  reasons to take this   carvedilol 6.25 MG tablet Commonly known as:  COREG TAKE 1 TABLET BY MOUTH TWICE A DAY What changed:  when to take this   cefdinir 300 MG capsule Commonly known as:  OMNICEF Take 1 capsule (300 mg total) by mouth 2 (two) times daily for 5 days.   cholecalciferol 1000 units tablet Commonly known as:  VITAMIN D Take 2,000 Units by mouth every evening.   dextromethorphan-guaiFENesin 30-600 MG 12hr tablet Commonly known as:  MUCINEX DM Take 1 tablet by mouth 2 (two) times daily as needed for cough.   ferrous sulfate 325 (65 FE) MG tablet Take 325 mg by mouth every evening.   fluticasone 50 MCG/ACT nasal spray Commonly known as:  FLONASE SPRAY 2 SPRAYS INTO EACH NOSTRIL EVERY DAY What changed:  See the new instructions.   furosemide 40 MG tablet Commonly known as:  LASIX Take 2 tablets (80 mg total) by mouth 2 (two) times daily.   levothyroxine 100 MCG tablet Commonly known as:  SYNTHROID, LEVOTHROID Take 100 mcg by mouth daily before breakfast.   Potassium Chloride ER 20 MEQ Tbcr Take 20 mEq by mouth daily.   PRESCRIPTION MEDICATION Inhale into the lungs at bedtime. CPAP   PRESERVISION AREDS PO Take 1 tablet by mouth 2 (two) times daily.   SENOKOT 8.6 MG tablet Generic drug:  senna Take 2 tablets by mouth every evening.   spironolactone 25 MG tablet Commonly known as:  ALDACTONE TAKE 1/2 TABLET (12.5 MG TOTAL) BY MOUTH DAILY. What changed:  See the new instructions.   traMADol 50 MG tablet Commonly known as:  ULTRAM Take 50 mg by mouth 3 (three) times daily as needed for moderate pain.   TRELEGY ELLIPTA 100-62.5-25 MCG/INH Aepb Generic drug:  Fluticasone-Umeclidin-Vilant INHALE 1 PUFF INTO LUNGS ONCE A DAY What changed:   See the new instructions.   vitamin B-12 1000 MCG tablet Commonly known as:  CYANOCOBALAMIN Take 1,000 mcg by mouth daily.   warfarin 5 MG tablet Commonly known as:  COUMADIN Take as directed. If you are unsure how to take this medication, talk to your nurse or doctor. Original instructions:  TAKE AS DIRECTED BY COUMADIN CLINIC What changed:  See the new instructions.   XULTOPHY 100-3.6 UNIT-MG/ML Sopn Generic drug:  Insulin Degludec-Liraglutide Inject 16 Units into the skin every morning.      Consultations:  None   Procedures/Studies:  2D echo  Study Conclusions  - Left ventricle: The cavity size was severely dilated. Systolic function was moderately to severely reduced. The estimated ejection fraction was in the range of 30% to 35%. Severe diffuse hypokinesis with distinct regional wall motion abnormalities. There is severe hypokinesis to akinesis of the inferolateral and inferior myocardium. There is hypokinesis of the lateral myocardium. There is akinesis of the apical septal, apical anterior and apical myocardium. - Aortic valve: Mildly to moderately calcified annulus. Trileaflet; normal thickness, moderately calcified leaflets. Valve area (VTI): 2.55 cm^2.  Valve area (Vmax): 2.7 cm^2. Valve area (Vmean): 2.13 cm^2. - Aorta: Aortic root dimension: 39 mm (ED). - Aortic root: The aortic root was mildly dilated. - Mitral valve: There was mild regurgitation. Valve area by continuity equation (using LVOT flow): 2.29 cm^2. - Left atrium: The atrium was severely dilated. - Right atrium: The atrium was mildly dilated. - Pulmonic valve: There was trivial regurgitation. - Pulmonary arteries: PA peak pressure: 45 mm Hg (S).  Impressions: - The right ventricular systolic pressure was increased consistent with moderate pulmonary hypertension.  Dg Chest 2 View  Result Date: 09/20/2018 CLINICAL DATA:  Chronic cough EXAM: CHEST - 2 VIEW COMPARISON:  09/17/2018 FINDINGS: Cardiac  shadow is stable. Defibrillator is again noted and stable. The lungs are hyperinflated with bibasilar atelectatic changes slightly worse on the right than the left. No sizable effusion is seen. No acute bony abnormality is noted. No pneumothorax is seen IMPRESSION: Bibasilar atelectasis increased from the prior exam. Electronically Signed   By: [name] M.D.   On: 09/20/2018 08:51   Dg Chest 2 View  Result Date: 09/17/2018 CLINICAL DATA:  Dizziness and weakness for few days, vomited on Saturday morning, legs gave out, history atrial fibrillation, CHF, COPD, coronary artery disease post MI and CABG, type II diabetes mellitus EXAM: CHEST - 2 VIEW COMPARISON:  08/03/2018 FINDINGS: LEFT subclavian AICD with leads projecting at RIGHT atrium, RIGHT ventricle, and coronary sinus. Enlargement of cardiac silhouette. Mediastinal contours and pulmonary vascularity normal. Atherosclerotic calcification aorta. RIGHT basilar atelectasis. Accentuated markings in the upper lobes slightly greater on RIGHT, unchanged. No acute infiltrate or pneumothorax. Tiny pleural effusions blunt the posterior costophrenic angles. Bones demineralized with chronic compression fracture of a vertebra at the thoracolumbar junction. IMPRESSION: Enlargement of cardiac silhouette post CABG and AICD. Bibasilar atelectasis greater on RIGHT with tiny BILATERAL pleural effusions. Electronically Signed   By: Lavonia Dana M.D.   On: 09/17/2018 09:19   Ct Chest W Contrast  Result Date: 09/17/2018 CLINICAL DATA:  84 year old male with persistent cough and shortness of breath. EXAM: CT CHEST WITH CONTRAST TECHNIQUE: Multidetector CT imaging of the chest was performed during intravenous contrast administration. CONTRAST:  4mL OMNIPAQUE IOHEXOL 300 MG/ML  SOLN COMPARISON:  01/15/2016 CT FINDINGS: Cardiovascular: Cardiomegaly, CABG changes and ICD again noted. Aortic atherosclerotic scratch de coronary artery and aortic atherosclerotic calcifications  again noted. There is no evidence of thoracic aortic aneurysm or dissection. No pericardial effusion. Mediastinum/Nodes: No enlarged mediastinal, hilar, or axillary lymph nodes. Thyroid gland, trachea, and esophagus demonstrate no significant findings. Lungs/Pleura: A 1.1 x 1.3 x 1.9 cm irregular LEFT UPPER lobe nodule (series 4:39) is identified. Mild ground-glass/airspace/tree-in-bud opacities within the anterior RIGHT UPPER lobe, posterior RIGHT UPPER lobe and within the RIGHT LOWER lobe identified likely representing infection. Moderate centrilobular emphysema is identified. Mild peribronchial thickening is present. No pleural effusion or pneumothorax. Upper Abdomen: A 1.8 cm gallstone is present. No CT evidence of acute cholecystitis. Musculoskeletal: No acute or suspicious bony abnormalities noted. Remote rib fractures and L1 fracture again noted. IMPRESSION: 1. Irregular 1.1 x 1.3 x 1.9 cm LEFT UPPER lobe nodule worrisome for malignancy. Consider one of the following for both low-risk and high-risk individuals: (a) repeat chest CT in 3 months, (b) follow-up PET-CT, or (c) tissue sampling. This recommendation follows the consensus statement: Guidelines for Management of Incidental Pulmonary Nodules Detected on CT Images: From the Fleischner Society 2017; Radiology 2017; 284:228-243. 2. Mild ground-glass/airspace/tree-in-bud opacities within the RIGHT UPPER and LOWER lobe likely representing  infection. 3. Cardiomegaly and coronary artery disease.  Status post CABG. 4. Cholelithiasis 5. Aortic Atherosclerosis (ICD10-I70.0) and Emphysema (ICD10-J43.9). Electronically Signed   By: Margarette Canada M.D.   On: 09/17/2018 11:41   US Renal  Result Date: 09/19/2018 CLINICAL DATA:  Acute kidney injury. EXAM: RENAL / URINARY TRACT ULTRASOUND COMPLETE COMPARISON:  Abdominopelvic CT 05/05/2018 FINDINGS: Right Kidney: Renal measurements: 10.7 x 6.4 x 5.6 cm = volume: 199.9 mL. Slight renal cortical thinning. Echogenicity  within normal limits. No mass or hydronephrosis visualized. Left Kidney: Renal measurements: 9.9 x 5.7 x 5.2 cm = volume: 152.6 mL. Slight renal cortical thinning. Echogenicity within normal limits. No mass or hydronephrosis visualized. Bladder: Appears normal for degree of bladder distention. IMPRESSION: No obstructive uropathy.  No explanation for acute kidney injury. Electronically Signed   By: Keith Rake M.D.   On: 09/19/2018 05:44     Subjective: - no chest pain, shortness of breath, no abdominal pain, nausea or vomiting.   Discharge Exam: Vitals:   09/19/18 2325 09/20/18 0818  BP: (!) 145/64 120/61  Pulse: 69 69  Resp: 18   Temp: 97.6 F (36.4 C) 97.7 F (36.5 C)  SpO2: 98% 100%    General: Pt is alert, awake, not in acute distress Cardiovascular: RRR, S1/S2 +, no rubs, no gallops Respiratory: CTA bilaterally, no wheezing, no rhonchi Abdominal: Soft, NT, ND, bowel sounds + Extremities: no edema, no cyanosis    The results of significant diagnostics from this hospitalization (including imaging, microbiology, ancillary and laboratory) are listed below for reference.     Microbiology: Recent Results (from the past 240 hour(s))  Culture, blood (Routine X 2) w Reflex to ID Panel     Status: None (Preliminary result)   Collection Time: 09/17/18  2:00 PM  Result Value Ref Range Status   Specimen Description BLOOD RIGHT ANTECUBITAL  Final   Special Requests   Final    BOTTLES DRAWN AEROBIC AND ANAEROBIC Blood Culture adequate volume   Culture   Final    NO GROWTH 3 DAYS Performed at [facility], [address], [place], [place] [address]    Report Status PENDING  Incomplete  Culture, blood (Routine X 2) w Reflex to ID Panel     Status: None (Preliminary result)   Collection Time: 09/17/18  2:15 PM  Result Value Ref Range Status   Specimen Description BLOOD LEFT ANTECUBITAL  Final   Special Requests   Final    BOTTLES DRAWN AEROBIC AND ANAEROBIC Blood Culture  adequate volume   Culture   Final    NO GROWTH 3 DAYS Performed at [facility], [place] [address] [place], [place] [address]    Report Status PENDING  Incomplete  Culture, sputum-assessment     Status: None   Collection Time: 09/19/18  6:02 PM  Result Value Ref Range Status   Specimen Description EXPECTORATED SPUTUM  Final   Special Requests NONE  Final   Sputum evaluation   Final    THIS SPECIMEN IS ACCEPTABLE FOR SPUTUM CULTURE Performed at [facility], [place] [address], [place], [place] [address]    Report Status 09/19/2018 FINAL  Final  Culture, respiratory     Status: None (Preliminary result)   Collection Time: 09/19/18  6:02 PM  Result Value Ref Range Status   Specimen Description EXPECTORATED SPUTUM  Final   Special Requests NONE Reflexed from [id number]  Final   Gram Stain   Final    RARE  WBC PRESENT, PREDOMINANTLY PMN MODERATE YEAST    Culture   Final    CULTURE REINCUBATED FOR BETTER GROWTH Performed at [facility], [place] [address], [place], [place] [address]    Report Status PENDING  Incomplete     Labs: BNP (last 3 results) Recent Labs    07/14/18 0029 09/17/18 0850  BNP 651.2* 850.2*   Basic Metabolic Panel: Recent Labs  Lab 09/17/18 0850 09/18/18 1110 09/19/18 0307 09/20/18 0240  NA 135 138 139 140  K 3.8 4.0 3.6 4.0  CL 102 107 107 109  CO2 25 24 25 26   GLUCOSE 109* 130* 75 82  BUN 14 20 17 13   CREATININE 1.26* 1.56* 1.37* 1.19  CALCIUM 8.7* 8.5* 8.6* 8.7*  MG  --  1.9  --  2.1   Liver Function Tests: Recent Labs  Lab 09/17/18 0850  AST 18  ALT 10  ALKPHOS 59  BILITOT 0.8  PROT 6.2*  ALBUMIN 3.3*   Recent Labs  Lab 09/17/18 0850  LIPASE 26   No results for input(s): AMMONIA in the last 168 hours. CBC: Recent Labs  Lab 09/17/18 0850 09/18/18 0356 09/19/18 0307 09/20/18 0240  WBC 15.7* 10.7* 8.0 7.1  NEUTROABS 12.9*  --   --   --   HGB 12.8* 13.0 12.2* 12.2*  HCT 41.2 40.8 38.1* 37.4*  MCV 96.7 93.8 94.3  94.4  PLT 197 183 177 167   Cardiac Enzymes: No results for input(s): CKTOTAL, CKMB, CKMBINDEX, TROPONINI in the last 168 hours. BNP: Invalid input(s): POCBNP CBG: Recent Labs  Lab 09/19/18 1702 09/19/18 2129 09/20/18 0822 09/20/18 0859 09/20/18 1245  GLUCAP 110* 114* 68* 83 118*   D-Dimer No results for input(s): DDIMER in the last 72 hours. Hgb A1c Recent Labs    09/20/18 0240  HGBA1C 6.5*   Lipid Profile No results for input(s): CHOL, HDL, LDLCALC, TRIG, CHOLHDL, LDLDIRECT in the last 72 hours. Thyroid function studies No results for input(s): TSH, T4TOTAL, T3FREE, THYROIDAB in the last 72 hours.  Invalid input(s): FREET3 Anemia work up No results for input(s): VITAMINB12, FOLATE, FERRITIN, TIBC, IRON, RETICCTPCT in the last 72 hours. Urinalysis    Component Value Date/Time   COLORURINE YELLOW 09/17/2018 0850   APPEARANCEUR CLEAR 09/17/2018 0850   LABSPEC 1.006 09/17/2018 0850   PHURINE 5.0 09/17/2018 0850   GLUCOSEU NEGATIVE 09/17/2018 0850   HGBUR NEGATIVE 09/17/2018 0850   BILIRUBINUR NEGATIVE 09/17/2018 0850   KETONESUR NEGATIVE 09/17/2018 0850   PROTEINUR NEGATIVE 09/17/2018 0850   UROBILINOGEN 0.2 11/28/2008 0325   NITRITE NEGATIVE 09/17/2018 0850   LEUKOCYTESUR NEGATIVE 09/17/2018 0850   Sepsis Labs Invalid input(s): PROCALCITONIN,  WBC,  LACTICIDVEN   Time coordinating discharge: 40 minutes  SIGNED:  Marzetta Board, MD  [facility] 09/20/2018, 4:09 PM Pager [phone]  If 7PM-7AM, please contact night-coverage [email or web address] Password [email or web address]

## 2018-09-20 NOTE — Progress Notes (Addendum)
Pts CBG 68. MD notified, OJ given, will recheck in 15 mins CBG recheck [place]

## 2018-09-22 ENCOUNTER — Telehealth: Payer: Self-pay | Admitting: Physician Assistant

## 2018-09-22 LAB — CULTURE, BLOOD (ROUTINE X 2)
Culture: NO GROWTH
Culture: NO GROWTH
Special Requests: ADEQUATE
Special Requests: ADEQUATE

## 2018-09-22 LAB — CULTURE, RESPIRATORY W GRAM STAIN: Culture: NORMAL

## 2018-09-22 NOTE — Telephone Encounter (Signed)
Patient's wife called because his ICD had been beeping and his blood pressure had been low.  His ICD has been several times in the last 24 hours.  He has not reported symptoms such as palpitations, no ICD shocks.  His systolic blood pressure was in the 70s and 80s early this morning.  He takes Lasix 80 mg 2 times daily, carvedilol 6.25 mg twice daily and spironolactone 12.5 mg daily.  His wife thinks he has already had his medications this morning.  He is not complaining of being lightheaded or dizzy.  He denies orthostatic symptoms.  He is not having any pain or shortness of breath.  I contacted the [employer] rep and he stated if the patient would do a download, he would be able to access it.    I contacted the patient and they did so.  The download showed no significant ectopy.  He has had some atrial fibrillation recently, but not today.  No tachycardia, no VT and no issues with the battery.  The device is functioning normally.  The noise is making his an alarm that has to be manually turned off in the office.  As he is currently asymptomatic advised his wife to watch him carefully orthostatic symptoms, being lightheaded or dizzy.  He is to take it easy today.    He is to hold the Lasix and the Spironolactone for now. I advised that if his blood pressure systolic was greater than 110, he can take the Coreg.  If it is between 101 110, cut the Coreg in half.  If his systolic blood pressures below 100, do not take the Coreg either.  I will route this message to [name] into Benjaman Pott, NP.  He needs to be seen as soon as possible next week in the office.  I advised his wife that she needed to have a low threshold to bring him into the emergency room by calling 911 if he develops any symptoms and she assured me she would do so.  Rosaria Ferries, PA-C 09/22/2018 9:06 AM Beeper [phone]

## 2018-09-22 NOTE — Telephone Encounter (Signed)
I agree with the plan.

## 2018-09-26 ENCOUNTER — Ambulatory Visit (INDEPENDENT_AMBULATORY_CARE_PROVIDER_SITE_OTHER): Payer: PPO | Admitting: *Deleted

## 2018-09-26 ENCOUNTER — Telehealth: Payer: Self-pay | Admitting: Nurse Practitioner

## 2018-09-26 DIAGNOSIS — I5022 Chronic systolic (congestive) heart failure: Secondary | ICD-10-CM

## 2018-09-26 LAB — CUP PACEART INCLINIC DEVICE CHECK
Battery Remaining Longevity: 14 mo
Battery Voltage: 2.85 V
Brady Statistic AP VP Percent: 73.33 %
Brady Statistic AP VS Percent: 7.68 %
Brady Statistic AS VP Percent: 7.68 %
Brady Statistic AS VS Percent: 11.31 %
Brady Statistic RA Percent Paced: 76.82 %
Brady Statistic RV Percent Paced: 79.4 %
Date Time Interrogation Session: 20191203165526
HighPow Impedance: 49 Ohm
HighPow Impedance: 60 Ohm
Implantable Lead Implant Date: 20091215
Implantable Lead Implant Date: 20091215
Implantable Lead Implant Date: 20091215
Implantable Lead Location: 753858
Implantable Lead Location: 753859
Implantable Lead Location: 753860
Implantable Lead Model: 4196
Implantable Lead Model: 5076
Implantable Lead Model: 6947
Implantable Pulse Generator Implant Date: 20150113
Lead Channel Impedance Value: 4047 Ohm
Lead Channel Impedance Value: 456 Ohm
Lead Channel Impedance Value: 456 Ohm
Lead Channel Impedance Value: 475 Ohm
Lead Channel Impedance Value: 589 Ohm
Lead Channel Impedance Value: 836 Ohm
Lead Channel Pacing Threshold Amplitude: 0.75 V
Lead Channel Pacing Threshold Amplitude: 0.875 V
Lead Channel Pacing Threshold Amplitude: 1.5 V
Lead Channel Pacing Threshold Pulse Width: 0.4 ms
Lead Channel Pacing Threshold Pulse Width: 0.4 ms
Lead Channel Pacing Threshold Pulse Width: 0.6 ms
Lead Channel Sensing Intrinsic Amplitude: 0.75 mV
Lead Channel Sensing Intrinsic Amplitude: 1.375 mV
Lead Channel Sensing Intrinsic Amplitude: 12 mV
Lead Channel Sensing Intrinsic Amplitude: 12.375 mV
Lead Channel Setting Pacing Amplitude: 1.25 V
Lead Channel Setting Pacing Amplitude: 1.75 V
Lead Channel Setting Pacing Amplitude: 2.5 V
Lead Channel Setting Pacing Pulse Width: 0.6 ms
Lead Channel Setting Pacing Pulse Width: 1 ms
Lead Channel Setting Sensing Sensitivity: 0.3 mV

## 2018-09-26 NOTE — Progress Notes (Signed)
CRT-D device check in office d/t alert for unsuccessful transmission. Thresholds and sensing consistent with previous device measurements. Lead impedance trends stable over time. 21.7% AT/AF burden, max dur. 27hrs + warfarin. No ventricular arrhythmia episodes recorded. Patient bi-ventricularly pacing 99.6% of the time w/19.2% as VSRp. Device programmed with appropriate safety margins. Heart failure diagnostics reviewed and trends are stable for patient. No changes made this session. Estimated longevity 37months.  Patient will follow up as scheduled. Patient education completed including shock plan.

## 2018-09-26 NOTE — Telephone Encounter (Signed)
Pt wife called stating that his ICD alarm been going off every 4 hours since Thanksgiving.

## 2018-09-26 NOTE — Telephone Encounter (Signed)
New message   Patient's wife states that the alarm is going off on the device. Please advise.

## 2018-09-26 NOTE — Telephone Encounter (Signed)
Remote transmission from 11/29 reviewed. Presenting rhythm: ApBp. (1) AT/AF episode x 26hrs on 09/18/18. No ventricular arrhythmias recorded. Stable thoracic impedance. Normal device function. *Chronic RV bipolar lead impedance warning noted. Alert tone d/t unsuccessful [facility] Alert transmission.  Informed wife about the cause of the alert tone. Appt scheduled for 09/26/18 @ 1100 w/device clinic. Wife verbalized understanding.

## 2018-10-02 ENCOUNTER — Encounter: Payer: Self-pay | Admitting: Interventional Cardiology

## 2018-10-02 ENCOUNTER — Ambulatory Visit (INDEPENDENT_AMBULATORY_CARE_PROVIDER_SITE_OTHER): Payer: PPO | Admitting: Pharmacist

## 2018-10-02 ENCOUNTER — Ambulatory Visit: Payer: PPO | Admitting: Interventional Cardiology

## 2018-10-02 VITALS — BP 122/64 | HR 93 | Ht 68.0 in | Wt 196.4 lb

## 2018-10-02 DIAGNOSIS — Z5181 Encounter for therapeutic drug level monitoring: Secondary | ICD-10-CM | POA: Diagnosis not present

## 2018-10-02 DIAGNOSIS — Z7901 Long term (current) use of anticoagulants: Secondary | ICD-10-CM | POA: Diagnosis not present

## 2018-10-02 DIAGNOSIS — I4819 Other persistent atrial fibrillation: Secondary | ICD-10-CM

## 2018-10-02 DIAGNOSIS — I771 Stricture of artery: Secondary | ICD-10-CM

## 2018-10-02 DIAGNOSIS — I2510 Atherosclerotic heart disease of native coronary artery without angina pectoris: Secondary | ICD-10-CM | POA: Diagnosis not present

## 2018-10-02 DIAGNOSIS — E782 Mixed hyperlipidemia: Secondary | ICD-10-CM | POA: Diagnosis not present

## 2018-10-02 DIAGNOSIS — I739 Peripheral vascular disease, unspecified: Secondary | ICD-10-CM

## 2018-10-02 DIAGNOSIS — Z95810 Presence of automatic (implantable) cardiac defibrillator: Secondary | ICD-10-CM | POA: Diagnosis not present

## 2018-10-02 DIAGNOSIS — I480 Paroxysmal atrial fibrillation: Secondary | ICD-10-CM | POA: Diagnosis not present

## 2018-10-02 DIAGNOSIS — I5022 Chronic systolic (congestive) heart failure: Secondary | ICD-10-CM | POA: Diagnosis not present

## 2018-10-02 LAB — POCT INR: INR: 2.5 (ref 2.0–3.0)

## 2018-10-02 MED ORDER — FUROSEMIDE 40 MG PO TABS: 40.0000 mg | ORAL_TABLET | Freq: Every day | ORAL | 1 refills | Status: DC

## 2018-10-02 MED ORDER — CARVEDILOL 6.25 MG PO TABS: 3.1250 mg | ORAL_TABLET | Freq: Two times a day (BID) | ORAL | 3 refills | Status: DC

## 2018-10-02 NOTE — Progress Notes (Signed)
Cardiology Office Note   Date:  10/02/2018   ID:  [name], DOB 1934/01/26, MRN [id number]  PCP:  [name], MD    No chief complaint on file.  CAD  Wt Readings from Last 3 Encounters:  10/02/18 196 lb 6.4 oz (89.1 kg)  09/20/18 187 lb 6.3 oz (85 kg)  07/15/18 198 lb 10.2 oz (90.1 kg)       History of Present Illness: [name] is a 83 y.o. male  Who has a history of COPD on CPAP, left subclavian stenosis, CHF ( systolic, EF 24% in 2683), Afib s/p biventricular defibrillator, CAD s/p CABG, hypothyroidism, HTN, CVA.  Admitted 01/16/16 with acute resp. Failure with hypoxia acute COPD exacerbation RLL PNA, and acute systolic HF.Chronic systolic CHF: EF 41-96%  Paroxsymal Afib At discharge in 3/17: - Currently in AV paced rhythm on amiodarone. - CHADSVASC of 8, A/C with warfarin, theraputic AoCKD3- improving - ARB held,  -Resumed home oral lasix 40mg  BID  Helaterfailed attempts at restoring NSR with Dr. Rayann Heman. Ablation was done- see below.   Hehas hadbowel obstruction. He had some type of colon cleanse with relief.   He lost 15 lbs in 2017through portion control.   He had an AFib ablation and flutter ablation in 8/17.  He has chronic DOE.  In the past, He has broken some ribs with a fall on the left chest.  He was hospitalized in 11/19 for pneumonia.  Lost 70 lbs over the course of 2019.  2019 echo: Left ventricle: The cavity size was severely dilated. Systolic   function was moderately to severely reduced. The estimated   ejection fraction was in the range of 30% to 35%. Severe diffuse   hypokinesis with distinct regional wall motion abnormalities.   There is severe hypokinesis to akinesis of the inferolateral and   inferior myocardium. There is hypokinesis of the lateral   myocardium. There is akinesis of the apical septal, apical   anterior and apical myocardium. - Aortic valve: Mildly to moderately calcified annulus.  Trileaflet;   normal thickness, moderately calcified leaflets. Valve area   (VTI): 2.55 cm^2. Valve area (Vmax): 2.7 cm^2. Valve area   (Vmean): 2.13 cm^2. - Aorta: Aortic root dimension: 39 mm (ED). - Aortic root: The aortic root was mildly dilated. - Mitral valve: There was mild regurgitation. Valve area by   continuity equation (using LVOT flow): 2.29 cm^2. - Left atrium: The atrium was severely dilated. - Right atrium: The atrium was mildly dilated. - Pulmonic valve: There was trivial regurgitation. - Pulmonary arteries: PA peak pressure: 45 mm Hg (S).  Impressions:  - The right ventricular systolic pressure was increased consistent   with moderate pulmonary hypertension.   Spironolactone was stopped after he had low BP.  BP has been better.  Coreg was also decreased.    Denies : Chest pain. Dizziness. Leg edema. Nitroglycerin use. Orthopnea. Palpitations. Paroxysmal nocturnal dyspnea. Syncope.   Exercise limited by back pain.  He is considering injections.   Past Medical History:  Diagnosis Date  . Atrial fibrillation ([place])    persistent, previously seen at [facility] and placed on amiodarone  . Benign prostatic hypertrophy   . CAD (coronary artery disease)    multivessel s/p inferolateral wall MI with subsequent CABG 11/1998.  Cath 2009 with Patent grafts  . Cleft palate   . COPD with emphysema ([place]) 04/01/2010  . DM (diabetes mellitus), type 2 ([place])   . Dyspnea   . GERD (  gastroesophageal reflux disease)   . HTN (hypertension)   . Hyperlipidemia   . Hypothyroidism   . Iron deficiency anemia   . Ischemic dilated cardiomyopathy ([place])    EF 35-40% by MUGA 6/11  . Myocardial infarction ([place])   . Nasal septal deviation   . Nephrolithiasis   . OSA (obstructive sleep apnea)   . PAF (paroxysmal atrial fibrillation) ([place])   . Peripheral arterial disease ([facility])    left subclavian artery stenosis  . PNA (pneumonia)   . Psoriasis   . Seborrheic keratosis   . Stroke ([place])   .  Systolic congestive heart failure ([place]) 2009   s/p BiV ICD implantation by Dr Leonia Reeves (MDT)    Past Surgical History:  Procedure Laterality Date  . BI-VENTRICULAR IMPLANTABLE CARDIOVERTER DEFIBRILLATOR  (CRT-D)  10-08-08; 11-06-2013   Dr Leonia Reeves (MDT) implant for primary prevention; gen change to MDT VivaXT CRTD by Dr Rayann Heman  . BIV ICD GENERTAOR CHANGE OUT N/A 11/06/2013   Procedure: BIV ICD GENERTAOR CHANGE OUT;  Surgeon: Coralyn Mark, MD;  Location: [facility] CATH LAB;  Service: Cardiovascular;  Laterality: N/A;  . c-spine surgery    . CARDIOVERSION N/A 04/29/2016   Procedure: CARDIOVERSION;  Surgeon: Pixie Casino, MD;  Location[facility] ENDOSCOPY;  Service: Cardiovascular;  Laterality: N/A;  . CARPAL TUNNEL RELEASE    . CATARACT EXTRACTION    . CORONARY ARTERY BYPASS GRAFT     LIMA to LAD, SVG to OM, SVG to diagonal  . ELECTROPHYSIOLOGIC STUDY N/A 06/11/2016   Procedure: Atrial Fibrillation Ablation;  Surgeon: Thompson Grayer, MD;  Location: [place] CV LAB;  Service: Cardiovascular;  Laterality: N/A;  . left cleft palate and left cleft lip repair       Current Outpatient Medications  Medication Sig Dispense Refill  . albuterol (PROVENTIL HFA;VENTOLIN HFA) 108 (90 BASE) MCG/ACT inhaler Inhale 2 puffs into the lungs every 6 (six) hours as needed for wheezing or shortness of breath. Must keep June 2016 appt. 1 Inhaler 0  . albuterol (PROVENTIL) (2.5 MG/3ML) 0.083% nebulizer solution USE 1 VIAL VIA NEBULIZER 3 TIMES A DAY AS DIRECTED. DX:J44.9 (Patient taking differently: Take 2.5 mg by nebulization daily. ) 300 mL 3  . amiodarone (PACERONE) 400 MG tablet TAKE HALF TABLET (200 MG TOTAL) BY MOUTH DAILY. (Patient taking differently: Take 200 mg by mouth every morning. ) 30 tablet 9  . atorvastatin (LIPITOR) 80 MG tablet TAKE 1 TABLET (80 MG TOTAL) BY MOUTH DAILY. (Patient taking differently: Take 80 mg by mouth every morning. ) 90 tablet 3  . bisacodyl (DULCOLAX) 5 MG EC tablet Take 2 tablets (10 mg  total) by mouth daily at 12 noon. (Patient taking differently: Take 10 mg by mouth daily as needed for mild constipation. ) 30 tablet 0  . carvedilol (COREG) 6.25 MG tablet TAKE 1 TABLET BY MOUTH TWICE A DAY (Patient taking differently: Take 6.25 mg by mouth 2 (two) times daily with a meal. Take half a tablet if blood pressure is above 110) 180 tablet 3  . cholecalciferol (VITAMIN D) 1000 units tablet Take 2,000 Units by mouth every evening.    [name] dextromethorphan-guaiFENesin (MUCINEX DM) 30-600 MG 12hr tablet Take 1 tablet by mouth 2 (two) times daily as needed for cough.    . ferrous sulfate 325 (65 FE) MG tablet Take 325 mg by mouth every evening.     . fluticasone (FLONASE) 50 MCG/ACT nasal spray SPRAY 2 SPRAYS INTO EACH NOSTRIL EVERY DAY (Patient taking differently:  Place 2 sprays into both nostrils every morning. ) 48 g 3  . Insulin Degludec-Liraglutide (XULTOPHY) 100-3.6 UNIT-MG/ML SOPN Inject 16 Units into the skin every morning.    [name] levothyroxine (SYNTHROID, LEVOTHROID) 100 MCG tablet Take 100 mcg by mouth daily before breakfast.     . Multiple Vitamins-Minerals (PRESERVISION AREDS PO) Take 1 tablet by mouth 2 (two) times daily.    . potassium chloride 20 MEQ TBCR Take 20 mEq by mouth daily. 30 tablet 0  . PRESCRIPTION MEDICATION Inhale into the lungs at bedtime. CPAP    . senna (SENOKOT) 8.6 MG tablet Take 2 tablets by mouth every evening.     . traMADol (ULTRAM) 50 MG tablet Take 50 mg by mouth 3 (three) times daily as needed for moderate pain.   0  . TRELEGY ELLIPTA 100-62.5-25 MCG/INH AEPB INHALE 1 PUFF INTO LUNGS ONCE A DAY (Patient taking differently: Take 1 puff by mouth every morning. ) 60 each 3  . vitamin B-12 (CYANOCOBALAMIN) 1000 MCG tablet Take 1,000 mcg by mouth daily.    [name] warfarin (COUMADIN) 5 MG tablet TAKE AS DIRECTED BY COUMADIN CLINIC (Patient taking differently: Take 2.5-5 mg by mouth See admin instructions. Take 1/2 tablet (2.5mg ) by mouth in the evenings on Wed and Sat.  Take 1 tablet (5mg ) all other days in the evening) 40 tablet 3   No current facility-administered medications for this visit.     Allergies:   Aldactone [spironolactone]    Social History:  The patient  reports that he quit smoking about 19 years ago. His smoking use included cigarettes. He has a 195.00 pack-year smoking history. He has never used smokeless tobacco. He reports that he does not drink alcohol or use drugs.   Family History:  The patient's family history includes Asthma in his father; Heart attack in his brother; Heart disease in his brother; Hypertension in his brother, father, mother, and sister; Stroke in his father.    ROS:  Please see the history of present illness.   Otherwise, review of systems are positive for recent weight loss.   All other systems are reviewed and negative.    PHYSICAL EXAM: VS:  BP 122/64   Pulse 93   Ht 5\' 8"  (1.727 m)   Wt 196 lb 6.4 oz (89.1 kg)   SpO2 99%   BMI 29.86 kg/m  , BMI Body mass index is 29.86 kg/m. GEN: Well nourished, well developed, in no acute distress  HEENT: normal  Neck: no JVD, carotid bruits, or masses Cardiac: RRR; no murmurs, rubs, or gallops,no edema  Respiratory:  clear to auscultation bilaterally, normal work of breathing GI: soft, nontender, nondistended, + BS MS: no deformity or atrophy ; 3+ right brachial pulse; 1+ left brachial pulse Skin: warm and dry, no rash Neuro:  Strength and sensation are intact Psych: euthymic mood, full affect   EKG:   The ekg ordered today demonstrates regular rate and rhythm; likely jnctional tach   Recent Labs: 03/13/2018: NT-Pro BNP 5,305 07/13/2018: TSH 0.454 09/17/2018: ALT 10; B Natriuretic Peptide 419.7 09/20/2018: BUN 13; Creatinine, Ser 1.19; Hemoglobin 12.2; Magnesium 2.1; Platelets 167; Potassium 4.0; Sodium 140   Lipid Panel    Component Value Date/Time   CHOL 137 11/12/2014 0921   TRIG 95 11/12/2014 0921   HDL 50 11/12/2014 0921   CHOLHDL 2.6 11/29/2008  0330   VLDL 10 11/29/2008 0330   LDLCALC 68 11/12/2014 0921     Other studies Reviewed: Additional studies/ records that  were reviewed today with results demonstrating: hospital recirds reviewed.   ASSESSMENT AND PLAN:  1. CAD: No angina on medical therapy.  Continue aggressive secondary prevention.   2. Hyperlipidemia: The current medical regimen is effective;  continue present plan and medications. 3. PAF: Rhythm controlled. 4. Chronic systolic dysfunction/ischemic cardiomyopathy: Appears euvolemic.  Off of spironolactone. BP better.  Lasix was also stopped.  Will add back low dose Lasix given his low EF.  If dizziness returns, would have to stop.   5. AICD: Followed by EP.   6. PAD: Left subclavian stenosis.  No sx with the left arm.     Current medicines are reviewed at length with the patient today.  The patient concerns regarding his medicines were addressed.  The following changes have been made:  No change  Labs/ tests ordered today include:  No orders of the defined types were placed in this encounter.   Recommend 150 minutes/week of aerobic exercise Low fat, low carb, high fiber diet recommended  Disposition:   FU in 6 months   Signed, Larae Grooms, MD  10/02/2018 9:55 AM    [facility] [address], [place], [place]  [address] Phone: [phone]; Fax: [phone]

## 2018-10-02 NOTE — Patient Instructions (Addendum)
Medication Instructions:  Your physician has recommended you make the following change in your medication:   1. Remain off of the Spironolactone  2. Restart: furosemide (lasix) 40 mg tablet: Take 1 tablet once a day  3. Continue carvedilol 6.25 mg tablet: Take 1/2 tablet by mouth twice a day if systolic blood pressure (top number) is greater than 110   If you need a refill on your cardiac medications before your next appointment, please call your pharmacy.   Lab work: Your physician recommends that you Dr. Laurann Montana check a basic metabolic panel  If you have labs (blood work) drawn today and your tests are completely normal, you will receive your results only by: [name] MyChart Message (if you have MyChart) OR . A paper copy in the mail If you have any lab test that is abnormal or we need to change your treatment, we will call you to review the results.  Testing/Procedures: None ordered  Follow-Up: At [facility], you and your health needs are our priority.  As part of our continuing mission to provide you with exceptional heart care, we have created designated Provider Care Teams.  These Care Teams include your primary Cardiologist (physician) and Advanced Practice Providers (APPs -  Physician Assistants and Nurse Practitioners) who all work together to provide you with the care you need, when you need it. . You will need a follow up appointment in 6 months.  Please call our office 2 months in advance to schedule this appointment.  You may see Casandra Doffing, MD or one of the following Advanced Practice Providers on your designated Care Team:   . Lyda Jester, PA-C . Dayna Dunn, PA-C . Ermalinda Barrios, PA-C  Any Other Special Instructions Will Be Listed Below (If Applicable).

## 2018-10-02 NOTE — Patient Instructions (Signed)
Description   Continue taking 1 tablet everyday except 1/2 tablet on Wednesdays and Saturdays.  Continue eating 2-3 servings of leafy green vegetable each week.  Recheck in 4 weeks.   Call [phone] with any new medications.

## 2018-10-04 ENCOUNTER — Encounter (HOSPITAL_COMMUNITY): Payer: Self-pay | Admitting: Emergency Medicine

## 2018-10-04 ENCOUNTER — Inpatient Hospital Stay (HOSPITAL_COMMUNITY)
Admission: EM | Admit: 2018-10-04 | Discharge: 2018-10-07 | DRG: 871 | Disposition: A | Discharge status: Home Health | Payer: PPO | Attending: Internal Medicine | Admitting: Internal Medicine

## 2018-10-04 ENCOUNTER — Other Ambulatory Visit: Payer: Self-pay

## 2018-10-04 ENCOUNTER — Emergency Department (HOSPITAL_COMMUNITY): Payer: PPO

## 2018-10-04 DIAGNOSIS — E785 Hyperlipidemia, unspecified: Secondary | ICD-10-CM | POA: Diagnosis present

## 2018-10-04 DIAGNOSIS — J440 Chronic obstructive pulmonary disease with acute lower respiratory infection: Secondary | ICD-10-CM | POA: Diagnosis present

## 2018-10-04 DIAGNOSIS — Z951 Presence of aortocoronary bypass graft: Secondary | ICD-10-CM

## 2018-10-04 DIAGNOSIS — Z7901 Long term (current) use of anticoagulants: Secondary | ICD-10-CM

## 2018-10-04 DIAGNOSIS — A419 Sepsis, unspecified organism: Principal | ICD-10-CM

## 2018-10-04 DIAGNOSIS — I2510 Atherosclerotic heart disease of native coronary artery without angina pectoris: Secondary | ICD-10-CM | POA: Diagnosis present

## 2018-10-04 DIAGNOSIS — J189 Pneumonia, unspecified organism: Secondary | ICD-10-CM | POA: Diagnosis not present

## 2018-10-04 DIAGNOSIS — I255 Ischemic cardiomyopathy: Secondary | ICD-10-CM | POA: Diagnosis present

## 2018-10-04 DIAGNOSIS — Z87891 Personal history of nicotine dependence: Secondary | ICD-10-CM

## 2018-10-04 DIAGNOSIS — J181 Lobar pneumonia, unspecified organism: Secondary | ICD-10-CM | POA: Diagnosis present

## 2018-10-04 DIAGNOSIS — E669 Obesity, unspecified: Secondary | ICD-10-CM | POA: Diagnosis present

## 2018-10-04 DIAGNOSIS — Z7189 Other specified counseling: Secondary | ICD-10-CM

## 2018-10-04 DIAGNOSIS — Z79899 Other long term (current) drug therapy: Secondary | ICD-10-CM

## 2018-10-04 DIAGNOSIS — I4819 Other persistent atrial fibrillation: Secondary | ICD-10-CM | POA: Diagnosis present

## 2018-10-04 DIAGNOSIS — N400 Enlarged prostate without lower urinary tract symptoms: Secondary | ICD-10-CM | POA: Diagnosis present

## 2018-10-04 DIAGNOSIS — Z95810 Presence of automatic (implantable) cardiac defibrillator: Secondary | ICD-10-CM | POA: Diagnosis not present

## 2018-10-04 DIAGNOSIS — N179 Acute kidney failure, unspecified: Secondary | ICD-10-CM | POA: Diagnosis present

## 2018-10-04 DIAGNOSIS — R6521 Severe sepsis with septic shock: Secondary | ICD-10-CM | POA: Diagnosis present

## 2018-10-04 DIAGNOSIS — Z79890 Hormone replacement therapy (postmenopausal): Secondary | ICD-10-CM

## 2018-10-04 DIAGNOSIS — E162 Hypoglycemia, unspecified: Secondary | ICD-10-CM | POA: Diagnosis not present

## 2018-10-04 DIAGNOSIS — E161 Other hypoglycemia: Secondary | ICD-10-CM | POA: Diagnosis not present

## 2018-10-04 DIAGNOSIS — I10 Essential (primary) hypertension: Secondary | ICD-10-CM | POA: Diagnosis present

## 2018-10-04 DIAGNOSIS — R4020 Unspecified coma: Secondary | ICD-10-CM | POA: Diagnosis not present

## 2018-10-04 DIAGNOSIS — E039 Hypothyroidism, unspecified: Secondary | ICD-10-CM | POA: Diagnosis present

## 2018-10-04 DIAGNOSIS — I252 Old myocardial infarction: Secondary | ICD-10-CM | POA: Diagnosis not present

## 2018-10-04 DIAGNOSIS — I130 Hypertensive heart and chronic kidney disease with heart failure and stage 1 through stage 4 chronic kidney disease, or unspecified chronic kidney disease: Secondary | ICD-10-CM | POA: Diagnosis present

## 2018-10-04 DIAGNOSIS — R0602 Shortness of breath: Secondary | ICD-10-CM | POA: Diagnosis not present

## 2018-10-04 DIAGNOSIS — I5022 Chronic systolic (congestive) heart failure: Secondary | ICD-10-CM | POA: Diagnosis present

## 2018-10-04 DIAGNOSIS — R0603 Acute respiratory distress: Secondary | ICD-10-CM | POA: Diagnosis present

## 2018-10-04 DIAGNOSIS — G4733 Obstructive sleep apnea (adult) (pediatric): Secondary | ICD-10-CM | POA: Diagnosis present

## 2018-10-04 DIAGNOSIS — J9621 Acute and chronic respiratory failure with hypoxia: Secondary | ICD-10-CM | POA: Diagnosis present

## 2018-10-04 DIAGNOSIS — Z8673 Personal history of transient ischemic attack (TIA), and cerebral infarction without residual deficits: Secondary | ICD-10-CM

## 2018-10-04 DIAGNOSIS — E1151 Type 2 diabetes mellitus with diabetic peripheral angiopathy without gangrene: Secondary | ICD-10-CM | POA: Diagnosis present

## 2018-10-04 DIAGNOSIS — Z66 Do not resuscitate: Secondary | ICD-10-CM | POA: Diagnosis not present

## 2018-10-04 DIAGNOSIS — Z6829 Body mass index (BMI) 29.0-29.9, adult: Secondary | ICD-10-CM

## 2018-10-04 DIAGNOSIS — R0902 Hypoxemia: Secondary | ICD-10-CM | POA: Diagnosis not present

## 2018-10-04 DIAGNOSIS — E1140 Type 2 diabetes mellitus with diabetic neuropathy, unspecified: Secondary | ICD-10-CM

## 2018-10-04 DIAGNOSIS — E1122 Type 2 diabetes mellitus with diabetic chronic kidney disease: Secondary | ICD-10-CM | POA: Diagnosis present

## 2018-10-04 DIAGNOSIS — N183 Chronic kidney disease, stage 3 (moderate): Secondary | ICD-10-CM | POA: Diagnosis present

## 2018-10-04 DIAGNOSIS — K219 Gastro-esophageal reflux disease without esophagitis: Secondary | ICD-10-CM | POA: Diagnosis present

## 2018-10-04 DIAGNOSIS — Y95 Nosocomial condition: Secondary | ICD-10-CM | POA: Diagnosis present

## 2018-10-04 DIAGNOSIS — R404 Transient alteration of awareness: Secondary | ICD-10-CM | POA: Diagnosis not present

## 2018-10-04 DIAGNOSIS — Z7951 Long term (current) use of inhaled steroids: Secondary | ICD-10-CM

## 2018-10-04 DIAGNOSIS — I4891 Unspecified atrial fibrillation: Secondary | ICD-10-CM | POA: Diagnosis not present

## 2018-10-04 DIAGNOSIS — Z794 Long term (current) use of insulin: Secondary | ICD-10-CM

## 2018-10-04 DIAGNOSIS — Z888 Allergy status to other drugs, medicaments and biological substances status: Secondary | ICD-10-CM

## 2018-10-04 LAB — GLUCOSE, CAPILLARY: Glucose-Capillary: 82 mg/dL (ref 70–99)

## 2018-10-04 LAB — COMPREHENSIVE METABOLIC PANEL
ALT: 18 U/L (ref 0–44)
AST: 37 U/L (ref 15–41)
Albumin: 2.5 g/dL — ABNORMAL LOW (ref 3.5–5.0)
Alkaline Phosphatase: 58 U/L (ref 38–126)
Anion gap: 10 (ref 5–15)
BUN: 16 mg/dL (ref 8–23)
CO2: 18 mmol/L — ABNORMAL LOW (ref 22–32)
Calcium: 8.3 mg/dL — ABNORMAL LOW (ref 8.9–10.3)
Chloride: 109 mmol/L (ref 98–111)
Creatinine, Ser: 1.45 mg/dL — ABNORMAL HIGH (ref 0.61–1.24)
GFR calc Af Amer: 52 mL/min — ABNORMAL LOW (ref >60)
GFR calc non Af Amer: 45 mL/min — ABNORMAL LOW (ref >60)
Glucose, Bld: 110 mg/dL — ABNORMAL HIGH (ref 70–99)
Potassium: 6.1 mmol/L — ABNORMAL HIGH (ref 3.5–5.1)
Sodium: 137 mmol/L (ref 135–145)
Total Bilirubin: 1.6 mg/dL — ABNORMAL HIGH (ref 0.3–1.2)
Total Protein: 5 g/dL — ABNORMAL LOW (ref 6.5–8.1)

## 2018-10-04 LAB — CBC WITH DIFFERENTIAL/PLATELET
Band Neutrophils: 13 %
Basophils Absolute: 0 10*3/uL (ref 0.0–0.1)
Basophils Relative: 0 %
Blasts: 0 %
Eosinophils Absolute: 0.1 10*3/uL (ref 0.0–0.5)
Eosinophils Relative: 1 %
HCT: 41.7 % (ref 39.0–52.0)
Hemoglobin: 13.1 g/dL (ref 13.0–17.0)
Lymphocytes Relative: 5 %
Lymphs Abs: 0.6 10*3/uL — ABNORMAL LOW (ref 0.7–4.0)
MCH: 31 pg (ref 26.0–34.0)
MCHC: 31.4 g/dL (ref 30.0–36.0)
MCV: 98.8 fL (ref 80.0–100.0)
Metamyelocytes Relative: 1 %
Monocytes Absolute: 0.8 10*3/uL (ref 0.1–1.0)
Monocytes Relative: 6 %
Myelocytes: 1 %
Neutro Abs: 11 10*3/uL — ABNORMAL HIGH (ref 1.7–7.7)
Neutrophils Relative %: 73 %
Other: 0 %
Platelets: 160 10*3/uL (ref 150–400)
Promyelocytes Relative: 0 %
RBC: 4.22 MIL/uL (ref 4.22–5.81)
RDW: 14.7 % (ref 11.5–15.5)
WBC: 12.5 10*3/uL — ABNORMAL HIGH (ref 4.0–10.5)
nRBC: 0 % (ref 0.0–0.2)
nRBC: 0 /100 WBC

## 2018-10-04 LAB — I-STAT CG4 LACTIC ACID, ED
Lactic Acid, Venous: 3.83 mmol/L (ref 0.5–1.9)
Lactic Acid, Venous: 1.97 mmol/L — ABNORMAL HIGH (ref 0.5–1.9)

## 2018-10-04 LAB — BRAIN NATRIURETIC PEPTIDE: B Natriuretic Peptide: 485.2 pg/mL — ABNORMAL HIGH (ref 0.0–100.0)

## 2018-10-04 LAB — CBG MONITORING, ED: Glucose-Capillary: 129 mg/dL — ABNORMAL HIGH (ref 70–99)

## 2018-10-04 LAB — PROCALCITONIN: Procalcitonin: 8.71 ng/mL

## 2018-10-04 LAB — LACTIC ACID, PLASMA
Lactic Acid, Venous: 2 mmol/L (ref 0.5–1.9)
Lactic Acid, Venous: 1.3 mmol/L (ref 0.5–1.9)

## 2018-10-04 LAB — MRSA PCR SCREENING: MRSA by PCR: NEGATIVE

## 2018-10-04 LAB — POTASSIUM: Potassium: 4.5 mmol/L (ref 3.5–5.1)

## 2018-10-04 LAB — PROTIME-INR
INR: 3.66
Prothrombin Time: 35.8 seconds — ABNORMAL HIGH (ref 11.4–15.2)

## 2018-10-04 MED ORDER — VITAMIN D 25 MCG (1000 UNIT) PO TABS
2000.0000 [IU] | ORAL_TABLET | Freq: Every evening | ORAL | Status: DC
  Administered 2018-10-05 – 2018-10-06 (×2): 2000 [IU] via ORAL
  Filled 2018-10-04 (×2): qty 2

## 2018-10-04 MED ORDER — ALBUTEROL SULFATE (2.5 MG/3ML) 0.083% IN NEBU
2.5000 mg | INHALATION_SOLUTION | Freq: Once | RESPIRATORY_TRACT | Status: AC
  Administered 2018-10-04: 2.5 mg via RESPIRATORY_TRACT
  Filled 2018-10-04: qty 3

## 2018-10-04 MED ORDER — PIPERACILLIN-TAZOBACTAM 3.375 G IVPB 30 MIN
3.3750 g | Freq: Once | INTRAVENOUS | Status: AC
  Administered 2018-10-04: 3.375 g via INTRAVENOUS
  Filled 2018-10-04 (×2): qty 50

## 2018-10-04 MED ORDER — VANCOMYCIN HCL 10 G IV SOLR
1750.0000 mg | Freq: Once | INTRAVENOUS | Status: AC
  Administered 2018-10-04: 1750 mg via INTRAVENOUS
  Filled 2018-10-04: qty 1750

## 2018-10-04 MED ORDER — SODIUM CHLORIDE 0.9 % IV SOLN: 2.0000 g | INTRAVENOUS | Status: DC

## 2018-10-04 MED ORDER — INSULIN ASPART 100 UNIT/ML ~~LOC~~ SOLN: 0.0000 [IU] | Freq: Every day | SUBCUTANEOUS | Status: DC

## 2018-10-04 MED ORDER — FLUTICASONE PROPIONATE 50 MCG/ACT NA SUSP
2.0000 | Freq: Every day | NASAL | Status: DC
  Administered 2018-10-05 – 2018-10-06 (×2): 2 via NASAL
  Filled 2018-10-04: qty 16

## 2018-10-04 MED ORDER — TRAMADOL HCL 50 MG PO TABS: 50.0000 mg | ORAL_TABLET | Freq: Three times a day (TID) | ORAL | Status: DC | PRN

## 2018-10-04 MED ORDER — SODIUM CHLORIDE 0.9 % IV SOLN
1.0000 g | Freq: Once | INTRAVENOUS | Status: AC
  Administered 2018-10-04: 1 g via INTRAVENOUS
  Filled 2018-10-04: qty 1

## 2018-10-04 MED ORDER — ALBUTEROL SULFATE (2.5 MG/3ML) 0.083% IN NEBU
2.5000 mg | INHALATION_SOLUTION | RESPIRATORY_TRACT | Status: DC | PRN
  Administered 2018-10-06: 2.5 mg via RESPIRATORY_TRACT
  Filled 2018-10-04: qty 3

## 2018-10-04 MED ORDER — SODIUM CHLORIDE 0.9 % IV BOLUS
500.0000 mL | Freq: Once | INTRAVENOUS | Status: AC
  Administered 2018-10-04: 500 mL via INTRAVENOUS

## 2018-10-04 MED ORDER — UMECLIDINIUM BROMIDE 62.5 MCG/INH IN AEPB
1.0000 | INHALATION_SPRAY | Freq: Every day | RESPIRATORY_TRACT | Status: DC
  Administered 2018-10-05 – 2018-10-06 (×2): 1 via RESPIRATORY_TRACT
  Filled 2018-10-04: qty 7

## 2018-10-04 MED ORDER — LEVOTHYROXINE SODIUM 100 MCG PO TABS
100.0000 ug | ORAL_TABLET | Freq: Every day | ORAL | Status: DC
  Administered 2018-10-05 – 2018-10-07 (×3): 100 ug via ORAL
  Filled 2018-10-04 (×3): qty 1

## 2018-10-04 MED ORDER — SENNA 8.6 MG PO TABS
1.0000 | ORAL_TABLET | Freq: Two times a day (BID) | ORAL | Status: DC
  Administered 2018-10-05 – 2018-10-06 (×3): 8.6 mg via ORAL
  Filled 2018-10-04 (×4): qty 1

## 2018-10-04 MED ORDER — DM-GUAIFENESIN ER 30-600 MG PO TB12: 1.0000 | ORAL_TABLET | Freq: Two times a day (BID) | ORAL | Status: DC | PRN

## 2018-10-04 MED ORDER — NOREPINEPHRINE 4 MG/250ML-% IV SOLN
0.0000 ug/min | INTRAVENOUS | Status: DC
  Filled 2018-10-04: qty 250

## 2018-10-04 MED ORDER — INSULIN ASPART 100 UNIT/ML ~~LOC~~ SOLN
0.0000 [IU] | Freq: Three times a day (TID) | SUBCUTANEOUS | Status: DC
  Administered 2018-10-05 – 2018-10-06 (×3): 3 [IU] via SUBCUTANEOUS
  Administered 2018-10-06: 2 [IU] via SUBCUTANEOUS

## 2018-10-04 MED ORDER — PIPERACILLIN-TAZOBACTAM 3.375 G IVPB
3.3750 g | Freq: Three times a day (TID) | INTRAVENOUS | Status: DC
  Administered 2018-10-05 – 2018-10-07 (×7): 3.375 g via INTRAVENOUS
  Filled 2018-10-04 (×7): qty 50

## 2018-10-04 MED ORDER — FLUTICASONE-UMECLIDIN-VILANT 100-62.5-25 MCG/INH IN AEPB: 1.0000 | INHALATION_SPRAY | RESPIRATORY_TRACT | Status: DC

## 2018-10-04 MED ORDER — FLUTICASONE FUROATE-VILANTEROL 100-25 MCG/INH IN AEPB
1.0000 | INHALATION_SPRAY | Freq: Every day | RESPIRATORY_TRACT | Status: DC
  Administered 2018-10-05 – 2018-10-06 (×2): 1 via RESPIRATORY_TRACT
  Filled 2018-10-04: qty 28

## 2018-10-04 MED ORDER — SODIUM CHLORIDE 0.9 % IV BOLUS
30.0000 mL/kg | Freq: Once | INTRAVENOUS | Status: AC
  Administered 2018-10-04: 2670 mL via INTRAVENOUS

## 2018-10-04 MED ORDER — BISACODYL 5 MG PO TBEC: 10.0000 mg | DELAYED_RELEASE_TABLET | Freq: Every day | ORAL | Status: DC | PRN

## 2018-10-04 MED ORDER — AMIODARONE HCL 200 MG PO TABS
200.0000 mg | ORAL_TABLET | Freq: Every morning | ORAL | Status: DC
  Administered 2018-10-05 – 2018-10-06 (×2): 200 mg via ORAL
  Filled 2018-10-04 (×2): qty 1

## 2018-10-04 MED ORDER — VITAMIN B-12 1000 MCG PO TABS
1000.0000 ug | ORAL_TABLET | Freq: Every day | ORAL | Status: DC
  Administered 2018-10-05 – 2018-10-06 (×2): 1000 ug via ORAL
  Filled 2018-10-04 (×3): qty 1

## 2018-10-04 MED ORDER — ATORVASTATIN CALCIUM 80 MG PO TABS
80.0000 mg | ORAL_TABLET | ORAL | Status: DC
  Administered 2018-10-05 – 2018-10-07 (×3): 80 mg via ORAL
  Filled 2018-10-04 (×3): qty 1

## 2018-10-04 MED ORDER — FERROUS SULFATE 325 (65 FE) MG PO TABS
325.0000 mg | ORAL_TABLET | Freq: Every evening | ORAL | Status: DC
  Administered 2018-10-05 – 2018-10-06 (×2): 325 mg via ORAL
  Filled 2018-10-04 (×3): qty 1

## 2018-10-04 MED ORDER — VANCOMYCIN HCL 10 G IV SOLR
1750.0000 mg | INTRAVENOUS | Status: DC
  Filled 2018-10-04: qty 1750

## 2018-10-04 MED ORDER — SODIUM CHLORIDE 0.9 % IV SOLN
INTRAVENOUS | Status: DC
  Administered 2018-10-04: 16:00:00 via INTRAVENOUS

## 2018-10-04 MED ORDER — WARFARIN - PHARMACIST DOSING INPATIENT: Freq: Every day | Status: DC

## 2018-10-04 MED ORDER — ENSURE ENLIVE PO LIQD
237.0000 mL | Freq: Two times a day (BID) | ORAL | Status: DC
  Administered 2018-10-05 – 2018-10-06 (×4): 237 mL via ORAL

## 2018-10-04 MED ORDER — SODIUM CHLORIDE 0.9 % IV BOLUS
250.0000 mL | Freq: Once | INTRAVENOUS | Status: AC
  Administered 2018-10-04: 250 mL via INTRAVENOUS

## 2018-10-04 MED ORDER — HYDROCORTISONE NA SUCCINATE PF 100 MG IJ SOLR
50.0000 mg | Freq: Four times a day (QID) | INTRAMUSCULAR | Status: AC
  Administered 2018-10-04 – 2018-10-05 (×4): 50 mg via INTRAVENOUS
  Filled 2018-10-04 (×4): qty 2

## 2018-10-04 NOTE — ED Triage Notes (Signed)
Pt has dx of PNA and has been taking abx at home. Pts wife called EMS because pt is having generalized weakness. EMS states o2 sat in 60s room air. EMS placed nonrebreather. Pt AOx 4. Lungs clear per EMS. 50s palpated BP, initial CBG 61, up to 260 with no intervention. 20 RAC

## 2018-10-04 NOTE — ED Notes (Signed)
Per MD Lorin Mercy, increase fluids to 100/hr and MD will order steroids. Pt not a candidate for pressors.

## 2018-10-04 NOTE — Consult Note (Signed)
NAME:  [name], MRN:  [id number], DOB:  1934-10-22, LOS: 0 ADMISSION DATE:  10/04/2018, CONSULTATION DATE:  10/04/2018 REFERRING MD:  EDP - Pfifer, CHIEF COMPLAINT:  Septic shock and hypoxemic respiratory failure   Brief History   84 year old mad with PMH of COPD who was recently discharge from the hospital for PNA that was discharged on 11/27 after being treated with rocephin and zithromax.  Cultures were all negative.  Patient comes back with SOB and productive cough with clear secretions.  No fever/chills.  Patient felt weak on the day of presentation and presents with hypotension with SBP o\f 70 that responded to IVF with 1L to 104 and lactate clearance from 3.83 to 1.97.  HR remained stable.  PCCM called to assess for admission to the ICU.  History of present illness   84 year old mad with PMH of COPD who was recently discharge from the hospital for PNA that was discharged on 11/27 after being treated with rocephin and zithromax.  Cultures were all negative.  Patient comes back with SOB and productive cough with clear secretions.  No fever/chills.  Patient felt weak on the day of presentation and presents with hypotension with SBP o\f 70 that responded to IVF with 1L to 104 and lactate clearance from 3.83 to 1.97.  HR remained stable.  PCCM called to assess for admission to the ICU.  Past Medical History  See below  [place] [facility] Events   12/11 admission to the hospital for worsening pneumonia  Consults:  PCCM  Procedures:  None  Significant Diagnostic Tests:  CXR 12/11 that I reviewed myself with worsening left sided infiltrate  Micro Data:  Blood 12/11>>> Urine 12/11>>> Sputum 12/11>>>  Antimicrobials:  Vacomycins 12/11>>> Zosyn 12/11>>>  Interim history/subjective:  Feels better since admission and receiving IVF  Objective   Blood pressure (!) 97/51, pulse 85, resp. rate (!) 22, weight 89 kg, SpO2 96 %.       No intake or output data in the 24 hours  ending 10/04/18 1308 Filed Weights   10/04/18 0944  Weight: 89 kg    Examination: General: Chronically ill appearing male, NAD HENT: [place]/AT, PERRL, EOM-I and MMM Lungs: Coarse BS diffusely, crackles L>R Cardiovascular: IRIR, Nl S1/S2 and -M/R/G Abdomen: Soft, obese, NT, ND and +BS Extremities: -edema and -tenderness Neuro: Alert and interactive, moving all ext to command Skin: [facility] Problem list   N/A  Assessment & Plan:  84 year old male with recent d/c for PNA presenting with worsening pneumonia and concern for septic shock.  Discussed with PCCM-NP and EDP.  HCAP:  - Zosyn  - Vanc  - Pan culture  - PCT protocol  Hypotension: responding to IVF  - No pressors, patient is DNR  - IVF resuscitation  - Closer attention to mental status and clinical status than SBP  - KVO IVF at this point  - Hold coreg  CHF:  - BNP  - Echo if needed  - Cards evaluated patient on last admission, will defer to primary  CKD:  - BMET in AM  - Replace electrolytes as indicated  COPD:  - Albuterol PRN  - No controller  Hypoxemia:  - D/C 100% NRB  - Titrate O2 for sat of 88-92%  - Avoid sats above 95%  GOC: had an extensive discussion with patient and family, patient made the choice of no CPR/cardioversion/intubation.  Will change code status to DNR to include pressors.  Labs  CBC: Recent Labs  Lab 10/04/18 1003  WBC 12.5*  NEUTROABS 11.0*  HGB 13.1  HCT 41.7  MCV 98.8  PLT 937    Basic Metabolic Panel: Recent Labs  Lab 10/04/18 1003  NA 137  K 6.1*  CL 109  CO2 18*  GLUCOSE 110*  BUN 16  CREATININE 1.45*  CALCIUM 8.3*   GFR: Estimated Creatinine Clearance: 42.6 mL/min (A) (by C-G formula based on SCr of 1.45 mg/dL (H)). Recent Labs  Lab 10/04/18 1000 10/04/18 1003 10/04/18 1253  WBC  --  12.5*  --   LATICACIDVEN 3.83*  --  1.97*    Liver Function Tests: Recent Labs  Lab 10/04/18 1003  AST 37  ALT 18  ALKPHOS 58  BILITOT 1.6*    PROT 5.0*  ALBUMIN 2.5*   No results for input(s): LIPASE, AMYLASE in the last 168 hours. No results for input(s): AMMONIA in the last 168 hours.  ABG    Component Value Date/Time   PHART 7.532 (H) 11/28/2008 0451   PCO2ART 32.3 (L) 11/28/2008 0451   PO2ART 51.0 (L) 11/28/2008 0451   HCO3 27.2 (H) 11/28/2008 0451   TCO2 28 11/28/2008 0451   O2SAT 90.0 11/28/2008 0451     Coagulation Profile: Recent Labs  Lab 10/02/18 0927 10/04/18 1003  INR 2.5 3.66    Cardiac Enzymes: No results for input(s): CKTOTAL, CKMB, CKMBINDEX, TROPONINI in the last 168 hours.  HbA1C: Hgb A1c MFr Bld  Date/Time Value Ref Range Status  09/20/2018 02:40 AM 6.5 (H) 4.8 - 5.6 % Final    Comment:    (NOTE) Pre diabetes:          5.7%-6.4% Diabetes:              >6.4% Glycemic control for   <7.0% adults with diabetes   01/15/2016 08:14 AM 7.7 (H) 4.8 - 5.6 % Final    Comment:    (NOTE)         Pre-diabetes: 5.7 - 6.4         Diabetes: >6.4         Glycemic control for adults with diabetes: <7.0     CBG: No results for input(s): GLUCAP in the last 168 hours.  Review of Systems:     Past Medical History  He,  has a past medical history of Atrial fibrillation ([place]), Benign prostatic hypertrophy, CAD (coronary artery disease), Cleft palate, COPD with emphysema ([place]) (04/01/2010), DM (diabetes mellitus), type 2 ([place]), Dyspnea, GERD (gastroesophageal reflux disease), HTN (hypertension), Hyperlipidemia, Hypothyroidism, Iron deficiency anemia, Ischemic dilated cardiomyopathy ([place]), Myocardial infarction ([place]), Nasal septal deviation, Nephrolithiasis, OSA (obstructive sleep apnea), PAF (paroxysmal atrial fibrillation) ([place]), Peripheral arterial disease ([place]), PNA (pneumonia), Psoriasis, Seborrheic keratosis, Stroke ([place]), and Systolic congestive heart failure ([place]) (2009).   Surgical History    Past Surgical History:  Procedure Laterality Date  . BI-VENTRICULAR IMPLANTABLE CARDIOVERTER  DEFIBRILLATOR  (CRT-D)  10-08-08; 11-06-2013   Dr Leonia Reeves (MDT) implant for primary prevention; gen change to MDT VivaXT CRTD by Dr Rayann Heman  . BIV ICD GENERTAOR CHANGE OUT N/A 11/06/2013   Procedure: BIV ICD GENERTAOR CHANGE OUT;  Surgeon: Coralyn Mark, MD;  Location: [facility] CATH LAB;  Service: Cardiovascular;  Laterality: N/A;  . c-spine surgery    . CARDIOVERSION N/A 04/29/2016   Procedure: CARDIOVERSION;  Surgeon: Pixie Casino, MD;  Location: [facility] ENDOSCOPY;  Service: Cardiovascular;  Laterality: N/A;  . CARPAL TUNNEL RELEASE    . CATARACT EXTRACTION    .  CORONARY ARTERY BYPASS GRAFT     LIMA to LAD, SVG to OM, SVG to diagonal  . ELECTROPHYSIOLOGIC STUDY N/A 06/11/2016   Procedure: Atrial Fibrillation Ablation;  Surgeon: Thompson Grayer, MD;  Location: [facility] CV LAB;  Service: Cardiovascular;  Laterality: N/A;  . left cleft palate and left cleft lip repair       Social History   reports that he quit smoking about 19 years ago. His smoking use included cigarettes. He has a 195.00 pack-year smoking history. He has never used smokeless tobacco. He reports that he does not drink alcohol or use drugs.   Family History   His family history includes Asthma in his father; Heart attack in his brother; Heart disease in his brother; Hypertension in his brother, father, mother, and sister; Stroke in his father.   Allergies Allergies  Allergen Reactions  . Aldactone [Spironolactone] Other (See Comments)    Hyperkalemia  reported by Dr. [name] - pt is currently taking 12.5 mg daily -November 2017 per medication list by same MD     Home Medications  Prior to Admission medications   Medication Sig Start Date End Date Taking? Authorizing Provider  albuterol (PROVENTIL HFA;VENTOLIN HFA) 108 (90 BASE) MCG/ACT inhaler Inhale 2 puffs into the lungs every 6 (six) hours as needed for wheezing or shortness of breath. Must keep June 2016 appt. 03/04/15  Yes Chesley Mires, MD  albuterol (PROVENTIL) (2.5  MG/3ML) 0.083% nebulizer solution USE 1 VIAL VIA NEBULIZER 3 TIMES A DAY AS DIRECTED. DX:J44.9 Patient taking differently: Take 2.5 mg by nebulization daily.  08/21/18  Yes Chesley Mires, MD  amiodarone (PACERONE) 400 MG tablet TAKE HALF TABLET (200 MG TOTAL) BY MOUTH DAILY. Patient taking differently: Take 200 mg by mouth every morning.  02/21/18  Yes Allred, Jeneen Rinks, MD  atorvastatin (LIPITOR) 80 MG tablet TAKE 1 TABLET (80 MG TOTAL) BY MOUTH DAILY. Patient taking differently: Take 80 mg by mouth every morning.  07/10/18  Yes Jettie Booze, MD  bisacodyl (DULCOLAX) 5 MG EC tablet Take 2 tablets (10 mg total) by mouth daily at 12 noon. Patient taking differently: Take 10 mg by mouth daily as needed for mild constipation.  09/06/16  Yes Regalado, Belkys A, MD  carvedilol (COREG) 6.25 MG tablet Take 0.5 tablets (3.125 mg total) by mouth 2 (two) times daily. Take if systolic blood pressure >631 Patient taking differently: Take 3.125 mg by mouth See admin instructions. Take if systolic blood pressure >497 take 3.125 mg and if below he does not take any thing 10/02/18  Yes Jettie Booze, MD  cholecalciferol (VITAMIN D) 1000 units tablet Take 2,000 Units by mouth every evening.   Yes [provider]  dextromethorphan-guaiFENesin (MUCINEX DM) 30-600 MG 12hr tablet Take 1 tablet by mouth 2 (two) times daily as needed for cough.   Yes [provider]  ferrous sulfate 325 (65 FE) MG tablet Take 325 mg by mouth every evening.    Yes [provider]  fluticasone (FLONASE) 50 MCG/ACT nasal spray SPRAY 2 SPRAYS INTO EACH NOSTRIL EVERY DAY Patient taking differently: Place 2 sprays into both nostrils every morning.  08/16/18  Yes Chesley Mires, MD  furosemide (LASIX) 40 MG tablet Take 1 tablet (40 mg total) by mouth daily. Patient taking differently: Take 40 mg by mouth at bedtime.  10/02/18  Yes Jettie Booze, MD  Insulin Degludec-Liraglutide (XULTOPHY) 100-3.6 UNIT-MG/ML  SOPN Inject 16 Units into the skin every morning.   Yes [provider]  levothyroxine (SYNTHROID, LEVOTHROID) 100 MCG tablet Take 100 mcg by mouth daily before breakfast.  09/23/15  Yes [provider]  Multiple Vitamins-Minerals (PRESERVISION AREDS PO) Take 1 tablet by mouth 2 (two) times daily.   Yes [provider]  potassium chloride 20 MEQ TBCR Take 20 mEq by mouth daily. 07/15/18  Yes Thurnell Lose, MD  PRESCRIPTION MEDICATION Inhale into the lungs at bedtime. CPAP   Yes [provider]  senna (SENOKOT) 8.6 MG tablet Take 1 tablet by mouth 2 (two) times daily.    Yes [provider]  traMADol (ULTRAM) 50 MG tablet Take 50 mg by mouth 3 (three) times daily as needed for moderate pain.  06/07/18  Yes [provider]  TRELEGY ELLIPTA 100-62.5-25 MCG/INH AEPB INHALE 1 PUFF INTO LUNGS ONCE A DAY Patient taking differently: Take 1 puff by mouth every morning.  07/06/18  Yes Chesley Mires, MD  vitamin B-12 (CYANOCOBALAMIN) 1000 MCG tablet Take 1,000 mcg by mouth daily.   Yes [provider]  warfarin (COUMADIN) 5 MG tablet TAKE AS DIRECTED BY COUMADIN CLINIC Patient taking differently: Take 2.5-5 mg by mouth See admin instructions. Take 1/2 tablet (2.5mg ) by mouth in the evenings on Wed and Sat. Take 1 tablet (5mg ) all other days in the evening 03/31/18  Yes Jettie Booze, MD    PCCM will sign off, ok to admit to SDU, if worsens please do not hesitate to call.  Rush Farmer, M.D. [facility] Pulmonary/Critical Care Medicine. Pager: [phone]. After hours pager: [phone].

## 2018-10-04 NOTE — ED Notes (Signed)
MD Pfeiffer aware of sytolic bp's in the 94'T, Levo at bedside in case needed, per MD pfeiffer continue fluid boluses. Will continue to monitor.

## 2018-10-04 NOTE — ED Notes (Signed)
Unable to get second culture set prior to antibiotics

## 2018-10-04 NOTE — Progress Notes (Signed)
Pharmacy Antibiotic Note  [name] is a 84 y.o. male admitted on 10/04/2018 with pneumonia.  Patient was on zosyn now switching to cefepime.  -SCr= 1.45, CrCl ~ 40  Plan: -Zosyn 3.375gm IV q8h -Will follow renal function, cultures and clinical progress   Weight: 196 lb 3.4 oz (89 kg)  No data recorded.  Recent Labs  Lab 10/04/18 1000 10/04/18 1003 10/04/18 1253  WBC  --  12.5*  --   CREATININE  --  1.45*  --   LATICACIDVEN 3.83*  --  1.97*    Estimated Creatinine Clearance: 42.6 mL/min (A) (by C-G formula based on SCr of 1.45 mg/dL (H)).    Allergies  Allergen Reactions  . Aldactone [Spironolactone] Other (See Comments)    Hyperkalemia  reported by Dr. [name] - pt is currently taking 12.5 mg daily -November 2017 per medication list by same MD    Antimicrobials this admission: Cefepime 12/11 >> 12/11 Zosyn 12/11>> Vancomycin 12/11 >>   Dose adjustments this admission: N/A  Microbiology results: 12/11 BCx: sent  Thank you for allowing pharmacy to be a part of this patient's care.  Hildred Laser, PharmD Clinical Pharmacist **Pharmacist phone directory can now be found on [email or web address] ([email or web address]).  Listed under [place].

## 2018-10-04 NOTE — Progress Notes (Signed)
Patient arrived to unit in NAD . VS stable and patient free from pain.

## 2018-10-04 NOTE — ED Provider Notes (Signed)
[facility] EMERGENCY DEPARTMENT Provider Note   CSN: [id number] Arrival date & time: 10/04/18  4196     History   Chief Complaint Chief Complaint  Patient presents with  . Respiratory Distress    HPI [name] is a 84 y.o. male.  HPI Recently discharged from the hospital 11\27 for pneumonia.  He has just finished his course of antibiotics.  He has gotten significantly worse over the past day and overnight.  Patient became very short of breath this morning.  He was too weak to get up.  His wife had to assist him several times to try to get to the bathroom but he nearly passed out.  She sat him at the table and checked his blood pressure and she reports she got a systolic blood pressure of 40 and a could not read his heart rate.  EMS was called.  EMS reports blood pressure was 50 over palp.  Oxygen saturation was low 80s.  Patient was poorly responsive.  They have administered fluids 500 cc and got significant improvement of blood pressure up to greater than 222 systolic.  On nonrebreather mask patient became more alert.  At arrival patient is answering questions for me.  He denies he has any focal pain.  He reports he has some pain in the back of his neck but he thinks that is from lying around a lot.  No focal weakness numbness or tingling of the extremities.  No headache.  He denies any active chest pain.  He reports he does feel short of breath and generally very weak. Past Medical History:  Diagnosis Date  . Atrial fibrillation ([place])    persistent, previously seen at [facility] and placed on amiodarone  . Benign prostatic hypertrophy   . CAD (coronary artery disease)    multivessel s/p inferolateral wall MI with subsequent CABG 11/1998.  Cath 2009 with Patent grafts  . Cleft palate   . COPD with emphysema ([place]) 04/01/2010  . DM (diabetes mellitus), type 2 ([place])   . Dyspnea   . GERD (gastroesophageal reflux disease)   . HTN (hypertension)   . Hyperlipidemia     . Hypothyroidism   . Iron deficiency anemia   . Ischemic dilated cardiomyopathy ([place])    EF 35-40% by MUGA 6/11  . Myocardial infarction ([place])   . Nasal septal deviation   . Nephrolithiasis   . OSA (obstructive sleep apnea)   . PAF (paroxysmal atrial fibrillation) ([place])   . Peripheral arterial disease (HCC)    left subclavian artery stenosis  . PNA (pneumonia)   . Psoriasis   . Seborrheic keratosis   . Stroke ([place])   . Systolic congestive heart failure ([place]) 2009   s/p BiV ICD implantation by Dr Leonia Reeves (MDT)    Patient Active Problem List   Diagnosis Date Noted  . HCAP (healthcare-associated pneumonia) 09/17/2018  . Sepsis due to pneumonia ([place]) 07/13/2018  . Supratherapeutic INR 07/13/2018  . Hypoxia 07/13/2018  . Hypothyroidism 07/13/2018  . Pressure injury of skin 07/13/2018  . Community acquired pneumonia 09/03/2016  . Acute respiratory failure ([place]) 09/03/2016  . SOB (shortness of breath)   . A-fib ([place]) 06/11/2016  . Atrial fibrillation, persistent   . Allergic rhinitis 02/05/2016  . Long term (current) use of anticoagulants [Z79.01] 01/19/2016  . Chronic systolic heart failure ([place])   . Acute respiratory failure with hypoxia ([place]) 01/11/2016  . Acute on chronic systolic CHF (congestive heart failure), NYHA class 3 ([place]) 01/11/2016  .  Demand ischemia ([place]) 01/11/2016  . Acute renal failure superimposed on stage 3 chronic kidney disease ([place]) 01/11/2016  . Diabetes mellitus with diabetic neuropathy, with long-term current use of insulin ([place]) 01/11/2016  . Morbid obesity due to excess calories ([place]) 01/11/2016  . COPD (chronic obstructive pulmonary disease) ([place]) 01/10/2016  . Coronary artery disease   . Ischemic dilated cardiomyopathy ([place])   . PAF (paroxysmal atrial fibrillation) ([place])   . OSA (obstructive sleep apnea)   . Peripheral vascular disease ([place]) 10/02/2013  . Long term current use of anticoagulant therapy 08/21/2013  . Essential hypertension  04/01/2010  . Automatic implantable cardioverter-defibrillator in situ 03/31/2010    Past Surgical History:  Procedure Laterality Date  . BI-VENTRICULAR IMPLANTABLE CARDIOVERTER DEFIBRILLATOR  (CRT-D)  10-08-08; 11-06-2013   Dr Leonia Reeves (MDT) implant for primary prevention; gen change to MDT VivaXT CRTD by Dr Rayann Heman  . BIV ICD GENERTAOR CHANGE OUT N/A 11/06/2013   Procedure: BIV ICD GENERTAOR CHANGE OUT;  Surgeon: Coralyn Mark, MD;  Location: [facility] CATH LAB;  Service: Cardiovascular;  Laterality: N/A;  . c-spine surgery    . CARDIOVERSION N/A 04/29/2016   Procedure: CARDIOVERSION;  Surgeon: Pixie Casino, MD;  Location: [facility] ENDOSCOPY;  Service: Cardiovascular;  Laterality: N/A;  . CARPAL TUNNEL RELEASE    . CATARACT EXTRACTION    . CORONARY ARTERY BYPASS GRAFT     LIMA to LAD, SVG to OM, SVG to diagonal  . ELECTROPHYSIOLOGIC STUDY N/A 06/11/2016   Procedure: Atrial Fibrillation Ablation;  Surgeon: Thompson Grayer, MD;  Location: [facility] CV LAB;  Service: Cardiovascular;  Laterality: N/A;  . left cleft palate and left cleft lip repair          Home Medications    Prior to Admission medications   Medication Sig Start Date End Date Taking? Authorizing Provider  albuterol (PROVENTIL HFA;VENTOLIN HFA) 108 (90 BASE) MCG/ACT inhaler Inhale 2 puffs into the lungs every 6 (six) hours as needed for wheezing or shortness of breath. Must keep June 2016 appt. 03/04/15  Yes Chesley Mires, MD  albuterol (PROVENTIL) (2.5 MG/3ML) 0.083% nebulizer solution USE 1 VIAL VIA NEBULIZER 3 TIMES A DAY AS DIRECTED. DX:J44.9 Patient taking differently: Take 2.5 mg by nebulization daily.  08/21/18  Yes Chesley Mires, MD  amiodarone (PACERONE) 400 MG tablet TAKE HALF TABLET (200 MG TOTAL) BY MOUTH DAILY. Patient taking differently: Take 200 mg by mouth every morning.  02/21/18  Yes Allred, Jeneen Rinks, MD  atorvastatin (LIPITOR) 80 MG tablet TAKE 1 TABLET (80 MG TOTAL) BY MOUTH DAILY. Patient taking differently: Take 80 mg  by mouth every morning.  07/10/18  Yes Jettie Booze, MD  bisacodyl (DULCOLAX) 5 MG EC tablet Take 2 tablets (10 mg total) by mouth daily at 12 noon. Patient taking differently: Take 10 mg by mouth daily as needed for mild constipation.  09/06/16  Yes Regalado, Belkys A, MD  carvedilol (COREG) 6.25 MG tablet Take 0.5 tablets (3.125 mg total) by mouth 2 (two) times daily. Take if systolic blood pressure >175 Patient taking differently: Take 3.125 mg by mouth See admin instructions. Take if systolic blood pressure >102 take 3.125 mg and if below he does not take any thing 10/02/18  Yes Jettie Booze, MD  cholecalciferol (VITAMIN D) 1000 units tablet Take 2,000 Units by mouth every evening.   Yes [provider]  dextromethorphan-guaiFENesin (MUCINEX DM) 30-600 MG 12hr tablet Take 1 tablet by mouth 2 (two) times daily as needed for cough.  Yes [provider]  ferrous sulfate 325 (65 FE) MG tablet Take 325 mg by mouth every evening.    Yes [provider]  fluticasone (FLONASE) 50 MCG/ACT nasal spray SPRAY 2 SPRAYS INTO EACH NOSTRIL EVERY DAY Patient taking differently: Place 2 sprays into both nostrils every morning.  08/16/18  Yes Chesley Mires, MD  furosemide (LASIX) 40 MG tablet Take 1 tablet (40 mg total) by mouth daily. Patient taking differently: Take 40 mg by mouth at bedtime.  10/02/18  Yes Jettie Booze, MD  Insulin Degludec-Liraglutide (XULTOPHY) 100-3.6 UNIT-MG/ML SOPN Inject 16 Units into the skin every morning.   Yes [provider]  levothyroxine (SYNTHROID, LEVOTHROID) 100 MCG tablet Take 100 mcg by mouth daily before breakfast.  09/23/15  Yes [provider]  Multiple Vitamins-Minerals (PRESERVISION AREDS PO) Take 1 tablet by mouth 2 (two) times daily.   Yes [provider]  potassium chloride 20 MEQ TBCR Take 20 mEq by mouth daily. 07/15/18  Yes Thurnell Lose, MD  PRESCRIPTION MEDICATION Inhale into the lungs at  bedtime. CPAP   Yes [provider]  senna (SENOKOT) 8.6 MG tablet Take 1 tablet by mouth 2 (two) times daily.    Yes [provider]  traMADol (ULTRAM) 50 MG tablet Take 50 mg by mouth 3 (three) times daily as needed for moderate pain.  06/07/18  Yes [provider]  TRELEGY ELLIPTA 100-62.5-25 MCG/INH AEPB INHALE 1 PUFF INTO LUNGS ONCE A DAY Patient taking differently: Take 1 puff by mouth every morning.  07/06/18  Yes Chesley Mires, MD  vitamin B-12 (CYANOCOBALAMIN) 1000 MCG tablet Take 1,000 mcg by mouth daily.   Yes [provider]  warfarin (COUMADIN) 5 MG tablet TAKE AS DIRECTED BY COUMADIN CLINIC Patient taking differently: Take 2.5-5 mg by mouth See admin instructions. Take 1/2 tablet (2.5mg ) by mouth in the evenings on Wed and Sat. Take 1 tablet (5mg ) all other days in the evening 03/31/18  Yes Jettie Booze, MD    Family History Family History  Problem Relation Age of Onset  . Asthma Father   . Stroke Father   . Hypertension Father   . Hypertension Mother   . Heart disease Brother   . Heart attack Brother   . Hypertension Sister   . Hypertension Brother     Social History Social History   Tobacco Use  . Smoking status: Former Smoker    Packs/day: 3.00    Years: 65.00    Pack years: 195.00    Types: Cigarettes    Last attempt to quit: 10/25/1998    Years since quitting: 19.9  . Smokeless tobacco: Never Used  Substance Use Topics  . Alcohol use: No    Alcohol/week: 0.0 standard drinks    Comment: remote history of heavy alcohol use  . Drug use: No     Allergies   Aldactone [spironolactone]   Review of Systems Review of Systems 10 Systems reviewed and are negative for acute change except as noted in the HPI.   Physical Exam Updated Vital Signs BP (!) 111/50   Pulse 81   Resp 19   Wt 89 kg   SpO2 99%   BMI 29.83 kg/m   Physical Exam  Constitutional:  Patient is pale and fatigued in appearance.  No significant  respiratory distress at rest.  Deconditioned.  Patient is answering questions appropriately.  HENT:  Head: Normocephalic and atraumatic.  Dukas membranes dry.  Eyes: Pupils are equal, round,  and reactive to light. EOM are normal.  Neck: Neck supple.  Cardiovascular:  Heart rate 80s.  Heart sounds distant.     ED Treatments / Results  Labs (all labs ordered are listed, but only abnormal results are displayed) Labs Reviewed  COMPREHENSIVE METABOLIC PANEL - Abnormal; Notable for the following components:      Result Value   Potassium 6.1 (*)    CO2 18 (*)    Glucose, Bld 110 (*)    Creatinine, Ser 1.45 (*)    Calcium 8.3 (*)    Total Protein 5.0 (*)    Albumin 2.5 (*)    Total Bilirubin 1.6 (*)    GFR calc non Af Amer 45 (*)    GFR calc Af Amer 52 (*)    All other components within normal limits  CBC WITH DIFFERENTIAL/PLATELET - Abnormal; Notable for the following components:   WBC 12.5 (*)    All other components within normal limits  PROTIME-INR - Abnormal; Notable for the following components:   Prothrombin Time 35.8 (*)    All other components within normal limits  I-STAT CG4 LACTIC ACID, ED - Abnormal; Notable for the following components:   Lactic Acid, Venous 3.83 (*)    All other components within normal limits  CULTURE, BLOOD (ROUTINE X 2)  CULTURE, BLOOD (ROUTINE X 2)  URINALYSIS, ROUTINE W REFLEX MICROSCOPIC  BRAIN NATRIURETIC PEPTIDE    EKG EKG Interpretation  Date/Time:  Wednesday October 04 2018 09:41:31 EST Ventricular Rate:  84 PR Interval:    QRS Duration: 126 QT Interval:  415 QTC Calculation: 491 R Axis:   -49 Text Interpretation:  Sinus or ectopic atrial rhythm Nonspecific IVCD with LAD Anterolateral infarct, age indeterminate no sig change as compared to previous Confirmed by Charlesetta Shanks [phone]) on 10/04/2018 10:15:03 AM   Radiology Dg Chest Port 1 View  Result Date: 10/04/2018 CLINICAL DATA:  Severe shortness of breath EXAM: PORTABLE  CHEST 1 VIEW COMPARISON:  09/20/2018 FINDINGS: Extensive airspace disease asymmetric to the right. Chronic volume loss on the right pleural thickening. There is a background of COPD with left lung hyperinflation. Biventricular ICD/pacer leads from the left in stable position. Stable cardiomegaly. Status post CABG IMPRESSION: 1. Extensive airspace disease on the right consistent with pneumonia in the appropriate clinical setting. 2. COPD. Electronically Signed   By: Monte Fantasia M.D.   On: 10/04/2018 10:09    Procedures Procedures (including critical care time) CRITICAL CARE Performed by: Charlesetta Shanks   Total critical care time: 40  minutes  Critical care time was exclusive of separately billable procedures and treating other patients.  Critical care was necessary to treat or prevent imminent or life-threatening deterioration.  Critical care was time spent personally by me on the following activities: development of treatment plan with patient and/or surrogate as well as nursing, discussions with consultants, evaluation of patient's response to treatment, examination of patient, obtaining history from patient or surrogate, ordering and performing treatments and interventions, ordering and review of laboratory studies, ordering and review of radiographic studies, pulse oximetry and re-evaluation of patient's condition. Medications Ordered in ED Medications  ceFEPIme (MAXIPIME) 1 g in sodium chloride 0.9 % 100 mL IVPB (has no administration in time range)  sodium chloride 0.9 % bolus 250 mL (250 mLs Intravenous New Bag/Given 10/04/18 1044)  vancomycin (VANCOCIN) 1,750 mg in sodium chloride 0.9 % 500 mL IVPB (1,750 mg Intravenous New Bag/Given 10/04/18 1044)  sodium chloride 0.9 % bolus 2,670 mL (has no  administration in time range)  norepinephrine (LEVOPHED) 4mg  in D5W 258mL premix infusion (has no administration in time range)     Initial Impression / Assessment and Plan / ED Course  I  have reviewed the triage vital signs and the nursing notes.  Pertinent labs & imaging results that were available during my care of the patient were reviewed by me and considered in my medical decision making (see chart for details).  Clinical Course as of Oct 04 1104  Wed Oct 04, 2018  1103 Consult: Reviewed with Dr. Nelda Marseille.  ICU team will come down to assess the patient.   [MP]  9983 Patient's had total of approximately 750 cc fluid instilled.  We will proceed with full 30 cc/kg for sepsis.  With small fluid boluses, patient's blood pressure does improve to greater than 382 systolic.  He has trended down however into the 70s intermittently.  We will continue patient's weight-based fluid for sepsis with close supervision for respiratory distress.  Will initiate Levophed if patient has persistent hypotension despite fluid resuscitation.   [MP]  1104 We will continue   [MP]    Clinical Course User Index [MP] Charlesetta Shanks, MD   Patient presents with worsening general weakness after hospitalization for pneumonia.  Patient is hypotensive and hypoxic.  He is responding positively to supplemental oxygen and fluids however he has significantly decreased EF at baseline and appears quite deconditioned with comorbid illness.  I have updated family and anticipate patient may have increasing respiratory distress with resuscitation.  Patient may require additional respiratory support although at this time he is denying shortness of breath and maintaining oxygen saturations on nonrebreather mask.  Patient will be seen by critical care for admission.   Final Clinical Impressions(s) / ED Diagnoses   Final diagnoses:  Respiratory distress  HCAP (healthcare-associated pneumonia)  Hypoxia    ED Discharge Orders    None       Charlesetta Shanks, MD 10/04/18 1108

## 2018-10-04 NOTE — Progress Notes (Signed)
Pharmacy Antibiotic Note  [name] is a 84 y.o. male admitted on 10/04/2018 with pneumonia.  Pharmacy has been consulted for vancomycin dosing.  Recently diagnosed with PNA (on abx PTA). Presenting with generalized weakness and low O2 saturation. LA 3.83. WBC 12.5, Scr 1.45 (CrCl 43 mL/min).   Plan: Vancomycin 1750 mg  IV every 36 hours.  Goal trough 15-20 mcg/mL. Received cefepime 1 g IV once >> f/u plan for continuation at time of admission  Monitor renal fx, clinical pic, cx results, and vanc levels as appropriate  Weight: 196 lb 3.4 oz (89 kg)  No data recorded.  Recent Labs  Lab 10/04/18 1000 10/04/18 1003  WBC  --  12.5*  LATICACIDVEN 3.83*  --     Estimated Creatinine Clearance: 51.9 mL/min (by C-G formula based on SCr of 1.19 mg/dL).    Allergies  Allergen Reactions  . Aldactone [Spironolactone] Other (See Comments)    Hyperkalemia  reported by Dr. [name] - pt is currently taking 12.5 mg daily -November 2017 per medication list by same MD    Antimicrobials this admission: Cefepime 12/11 >>  Vancomycin 12/11 >>   Dose adjustments this admission: N/A  Microbiology results: 12/11 BCx: sent  Thank you for allowing pharmacy to be a part of this patient's care.  Antonietta Jewel, PharmD, [facility] Clinical Pharmacist  Pager: [phone] Phone: [phone] 10/04/2018 10:50 AM

## 2018-10-04 NOTE — Progress Notes (Signed)
ANTICOAGULATION CONSULT NOTE - Initial Consult  Pharmacy Consult for warfarin Indication: atrial fibrillation  Allergies  Allergen Reactions  . Aldactone [Spironolactone] Other (See Comments)    Hyperkalemia  reported by Dr. [name] - pt is currently taking 12.5 mg daily -November 2017 per medication list by same MD    Patient Measurements: Weight: 196 lb 3.4 oz (89 kg) Heparin Dosing Weight: 86.6 kg  Vital Signs: BP: 96/59 (12/11 1430) Pulse Rate: 79 (12/11 1430)  Labs: Recent Labs    10/02/18 0927 10/04/18 1003  HGB  --  13.1  HCT  --  41.7  PLT  --  160  LABPROT  --  35.8*  INR 2.5 3.66  CREATININE  --  1.45*    Estimated Creatinine Clearance: 42.6 mL/min (A) (by C-G formula based on SCr of 1.45 mg/dL (H)).   Medical History: Past Medical History:  Diagnosis Date  . Atrial fibrillation ([place])    persistent, previously seen at [facility] and placed on amiodarone  . Benign prostatic hypertrophy   . CAD (coronary artery disease)    multivessel s/p inferolateral wall MI with subsequent CABG 11/1998.  Cath 2009 with Patent grafts  . Cleft palate   . COPD with emphysema ([place]) 04/01/2010  . DM (diabetes mellitus), type 2 ([place])   . Dyspnea   . GERD (gastroesophageal reflux disease)   . HTN (hypertension)   . Hyperlipidemia   . Hypothyroidism   . Iron deficiency anemia   . Ischemic dilated cardiomyopathy ([place])    EF 35-40% by MUGA 6/11  . Myocardial infarction ([place])   . Nasal septal deviation   . Nephrolithiasis   . OSA (obstructive sleep apnea)   . PAF (paroxysmal atrial fibrillation) ([place])   . Peripheral arterial disease (HCC)    left subclavian artery stenosis  . PNA (pneumonia)   . Psoriasis   . Seborrheic keratosis   . Stroke ([place])   . Systolic congestive heart failure ([place]) 2009   s/p BiV ICD implantation by Dr Leonia Reeves (MDT)    Medications:  Scheduled:  . amiodarone  200 mg Oral q morning - 10a  . [START ON 10/05/2018] atorvastatin  80 mg Oral BH-q7a   . cholecalciferol  2,000 Units Oral QPM  . ferrous sulfate  325 mg Oral QPM  . [START ON 10/05/2018] fluticasone  2 spray Each Nare BH-q7a  . [START ON 10/05/2018] Fluticasone-Umeclidin-Vilant  1 puff Oral BH-q7a  . insulin aspart  0-5 Units Subcutaneous QHS  . insulin aspart  0-9 Units Subcutaneous TID WC  . [START ON 10/05/2018] levothyroxine  100 mcg Oral QAC breakfast  . senna  1 tablet Oral BID  . vitamin B-12  1,000 mcg Oral Daily    Assessment: 75 yof recently being treated with PNA- now presenting with SOB and productive cough. On warfarin PTA for hx of Afib (last dose on 12/10).   INR today came back supra-therapeutic at 3.66. Hgb 13.1, plt 160. No s/sx of bleeding. Started on cefepime and vancomycin, might impact warfarin sensitivity.   PTA regimen is 5 mg daily except 2.5 mg on Wed/Saturday.   Goal of Therapy:  INR 2-3 Monitor platelets by anticoagulation protocol: Yes   Plan:  Hold warfarin therapy for tonight Monitor daily INR and CBC Monitor for s/sx of bleeding  Antonietta Jewel, PharmD, BCCCP Clinical Pharmacist  Pager: [phone] Phone: [phone] 10/04/2018,2:34 PM

## 2018-10-04 NOTE — Progress Notes (Signed)
Set pt up on Cpap full face mask pt wears this at home.  Tolerating well.  No issues to report

## 2018-10-04 NOTE — ED Notes (Signed)
EDP made aware of BP see MAR

## 2018-10-04 NOTE — ED Notes (Addendum)
MD Pfeiffer notified about sytolics in the 54'Y-50'P, per her instructions, do not start Levo, continue to give the fluid bolus in 250 ml increments until the 2,670 ml goal is met. Pt has had 1500 ml at this time and is receiving another 250 bag.  Pt and family aware, pt mentating well at this time.

## 2018-10-04 NOTE — ED Notes (Signed)
MD Lorin Mercy notified about persistent systolics in the 33'O. Pt mentating well.  Will wait for further instructions and continue to monitor.

## 2018-10-04 NOTE — H&P (Signed)
History and Physical    [name] UUV:[id number] DOB: 29-Nov-1933 DOA: 10/04/2018  PCP: [name], MD Consultants:  [place]anasi/Allred - cardiology; eye; Halford Chessman - pulmonology Patient coming from:  Home - lives with wife; NOK: Wife, [phone]; [phone]  Chief Complaint: respiratory distress  HPI: [name] is a 84 y.o. male with medical history significant of persistent A. fib on amiodarone and anticoagulated with Coumadin, coronary artery disease status post CABG, COPD with history of tobacco use, hypertension, hyperlipidemia, hypothyroidism, chronic systolic CHF with recent hospitalization from 11/24-27.  He presented today with respiratory distress.  He came in because his BPp was low and they said his right lung has got pneumonia in it.  He did not fall - he went to the bathroom and his legs gave out coming back.  He was in the floor and his wife couldn't get him up.  He reports that he just got weak and was not SOB - but wife reports that he was SOB, BP 45/31.  Some chronic cough, no recent cough.  His cough is productive of clear sputum.  No fever.  He feels pretty good now.   ED Course:  Patient with recent admission.  Back with HCAP, responding to IVF and O2 and antibiotics.  PCCM consulted, he is now DNR.  Needs admission to SDU.  Review of Systems: As per HPI; otherwise review of systems reviewed and negative.   Ambulatory Status:  Ambulates with a cane or walker  Past Medical History:  Diagnosis Date  . Atrial fibrillation ([place])    persistent, previously seen at [facility] and placed on amiodarone  . Benign prostatic hypertrophy   . CAD (coronary artery disease)    multivessel s/p inferolateral wall MI with subsequent CABG 11/1998.  Cath 2009 with Patent grafts  . Cleft palate   . COPD with emphysema ([place]) 04/01/2010  . DM (diabetes mellitus), type 2 ([place])   . Dyspnea   . GERD (gastroesophageal reflux disease)   . HTN (hypertension)   . Hyperlipidemia   .  Hypothyroidism   . Iron deficiency anemia   . Ischemic dilated cardiomyopathy ([place])    EF 35-40% by MUGA 6/11  . Myocardial infarction ([place])   . Nasal septal deviation   . Nephrolithiasis   . OSA (obstructive sleep apnea)   . PAF (paroxysmal atrial fibrillation) ([place])   . Peripheral arterial disease (HCC)    left subclavian artery stenosis  . PNA (pneumonia)   . Psoriasis   . Seborrheic keratosis   . Stroke ([place])   . Systolic congestive heart failure ([place]) 2009   s/p BiV ICD implantation by Dr Leonia Reeves (MDT)    Past Surgical History:  Procedure Laterality Date  . BI-VENTRICULAR IMPLANTABLE CARDIOVERTER DEFIBRILLATOR  (CRT-D)  10-08-08; 11-06-2013   Dr Leonia Reeves (MDT) implant for primary prevention; gen change to MDT VivaXT CRTD by Dr Rayann Heman  . BIV ICD GENERTAOR CHANGE OUT N/A 11/06/2013   Procedure: BIV ICD GENERTAOR CHANGE OUT;  Surgeon: Coralyn Mark, MD;  Location[facility] CATH LAB;  Service: Cardiovascular;  Laterality: N/A;  . c-spine surgery    . CARDIOVERSION N/A 04/29/2016   Procedure: CARDIOVERSION;  Surgeon: Pixie Casino, MD;  Location: [facility] ENDOSCOPY;  Service: Cardiovascular;  Laterality: N/A;  . CARPAL TUNNEL RELEASE    . CATARACT EXTRACTION    . CORONARY ARTERY BYPASS GRAFT     LIMA to LAD, SVG to OM, SVG to diagonal  . ELECTROPHYSIOLOGIC STUDY N/A 06/11/2016   Procedure:  Atrial Fibrillation Ablation;  Surgeon: Thompson Grayer, MD;  Location: [place] CV LAB;  Service: Cardiovascular;  Laterality: N/A;  . left cleft palate and left cleft lip repair      Social History   Socioeconomic History  . Marital status: Married    Spouse name: Not on file  . Number of children: Not on file  . Years of education: Not on file  . Highest education level: Not on file  Occupational History  . Occupation: retired    Comment: Engineer, agricultural  Social Needs  . Financial resource strain: Not on file  . Food insecurity:    Worry: Not on file    Inability: Not on file  .  Transportation needs:    Medical: Not on file    Non-medical: Not on file  Tobacco Use  . Smoking status: Former Smoker    Packs/day: 3.00    Years: 65.00    Pack years: 195.00    Types: Cigarettes    Last attempt to quit: 10/25/1998    Years since quitting: 19.9  . Smokeless tobacco: Never Used  Substance and Sexual Activity  . Alcohol use: No    Alcohol/week: 0.0 standard drinks    Comment: remote history of heavy alcohol use  . Drug use: No  . Sexual activity: Not on file  Lifestyle  . Physical activity:    Days per week: Not on file    Minutes per session: Not on file  . Stress: Not on file  Relationships  . Social connections:    Talks on phone: Not on file    Gets together: Not on file    Attends religious service: Not on file    Active member of club or organization: Not on file    Attends meetings of clubs or organizations: Not on file    Relationship status: Not on file  . Intimate partner violence:    Fear of current or ex partner: Not on file    Emotionally abused: Not on file    Physically abused: Not on file    Forced sexual activity: Not on file  Other Topics Concern  . Not on file  Social History Narrative   Lives [place]   Retired    Allergies  Allergen Reactions  . Aldactone [Spironolactone] Other (See Comments)    Hyperkalemia  reported by Dr. [name] - pt is currently taking 12.5 mg daily -November 2017 per medication list by same MD    Family History  Problem Relation Age of Onset  . Asthma Father   . Stroke Father   . Hypertension Father   . Hypertension Mother   . Heart disease Brother   . Heart attack Brother   . Hypertension Sister   . Hypertension Brother     Prior to Admission medications   Medication Sig Start Date End Date Taking? Authorizing Provider  albuterol (PROVENTIL HFA;VENTOLIN HFA) 108 (90 BASE) MCG/ACT inhaler Inhale 2 puffs into the lungs every 6 (six) hours as needed for wheezing or shortness of breath. Must  keep June 2016 appt. 03/04/15  Yes Chesley Mires, MD  albuterol (PROVENTIL) (2.5 MG/3ML) 0.083% nebulizer solution USE 1 VIAL VIA NEBULIZER 3 TIMES A DAY AS DIRECTED. DX:J44.9 Patient taking differently: Take 2.5 mg by nebulization daily.  08/21/18  Yes Chesley Mires, MD  amiodarone (PACERONE) 400 MG tablet TAKE HALF TABLET (200 MG TOTAL) BY MOUTH DAILY. Patient taking differently: Take 200 mg by mouth every morning.  02/21/18  Yes Allred, Jeneen Rinks, MD  atorvastatin (LIPITOR) 80 MG tablet TAKE 1 TABLET (80 MG TOTAL) BY MOUTH DAILY. Patient taking differently: Take 80 mg by mouth every morning.  07/10/18  Yes Jettie Booze, MD  bisacodyl (DULCOLAX) 5 MG EC tablet Take 2 tablets (10 mg total) by mouth daily at 12 noon. Patient taking differently: Take 10 mg by mouth daily as needed for mild constipation.  09/06/16  Yes Regalado, Belkys A, MD  carvedilol (COREG) 6.25 MG tablet Take 0.5 tablets (3.125 mg total) by mouth 2 (two) times daily. Take if systolic blood pressure >629 Patient taking differently: Take 3.125 mg by mouth See admin instructions. Take if systolic blood pressure >528 take 3.125 mg and if below he does not take any thing 10/02/18  Yes Jettie Booze, MD  cholecalciferol (VITAMIN D) 1000 units tablet Take 2,000 Units by mouth every evening.   Yes [provider]  dextromethorphan-guaiFENesin (MUCINEX DM) 30-600 MG 12hr tablet Take 1 tablet by mouth 2 (two) times daily as needed for cough.   Yes [provider]  ferrous sulfate 325 (65 FE) MG tablet Take 325 mg by mouth every evening.    Yes [provider]  fluticasone (FLONASE) 50 MCG/ACT nasal spray SPRAY 2 SPRAYS INTO EACH NOSTRIL EVERY DAY Patient taking differently: Place 2 sprays into both nostrils every morning.  08/16/18  Yes Chesley Mires, MD  furosemide (LASIX) 40 MG tablet Take 1 tablet (40 mg total) by mouth daily. Patient taking differently: Take 40 mg by mouth at bedtime.  10/02/18  Yes  Jettie Booze, MD  Insulin Degludec-Liraglutide (XULTOPHY) 100-3.6 UNIT-MG/ML SOPN Inject 16 Units into the skin every morning.   Yes [provider]  levothyroxine (SYNTHROID, LEVOTHROID) 100 MCG tablet Take 100 mcg by mouth daily before breakfast.  09/23/15  Yes [provider]  Multiple Vitamins-Minerals (PRESERVISION AREDS PO) Take 1 tablet by mouth 2 (two) times daily.   Yes [provider]  potassium chloride 20 MEQ TBCR Take 20 mEq by mouth daily. 07/15/18  Yes Thurnell Lose, MD  PRESCRIPTION MEDICATION Inhale into the lungs at bedtime. CPAP   Yes [provider]  senna (SENOKOT) 8.6 MG tablet Take 1 tablet by mouth 2 (two) times daily.    Yes [provider]  traMADol (ULTRAM) 50 MG tablet Take 50 mg by mouth 3 (three) times daily as needed for moderate pain.  06/07/18  Yes [provider]  TRELEGY ELLIPTA 100-62.5-25 MCG/INH AEPB INHALE 1 PUFF INTO LUNGS ONCE A DAY Patient taking differently: Take 1 puff by mouth every morning.  07/06/18  Yes Chesley Mires, MD  vitamin B-12 (CYANOCOBALAMIN) 1000 MCG tablet Take 1,000 mcg by mouth daily.   Yes [provider]  warfarin (COUMADIN) 5 MG tablet TAKE AS DIRECTED BY COUMADIN CLINIC Patient taking differently: Take 2.5-5 mg by mouth See admin instructions. Take 1/2 tablet (2.5mg ) by mouth in the evenings on Wed and Sat. Take 1 tablet (5mg ) all other days in the evening 03/31/18  Yes Jettie Booze, MD    Physical Exam: Vitals:   10/04/18 1530 10/04/18 1600 10/04/18 1630 10/04/18 1730  BP: 92/67 (!) 96/59 (!) 95/53 (!) 91/44  Pulse: 81 88 86 81  Resp: 19 (!) 29 (!) 26 (!) 28  SpO2: 100% 96% 97% 100%  Weight:         General:  Appears calm and comfortable, only mildly SOB Eyes:  PERRL, EOMI, normal lids, iris ENT:  grossly  normal hearing, lips & tongue, mmm Neck:  no LAD, masses or thyromegaly Cardiovascular:  RRR, no m/r/g. No LE edema.  Respiratory:  Mild  mostly right-sided rhonchi,  Mildly increased respiratory effort. Abdomen:  soft, NT, ND, NABS Skin:  no rash or induration seen on limited exam Musculoskeletal:  grossly normal tone BUE/BLE, good ROM, no bony abnormality Psychiatric:  grossly normal mood and affect, speech fluent and appropriate, AOx3 Neurologic:  CN 2-12 grossly intact, moves all extremities in coordinated fashion, sensation intact    Radiological Exams on Admission: Dg Chest Port 1 View  Result Date: 10/04/2018 CLINICAL DATA:  Severe shortness of breath EXAM: PORTABLE CHEST 1 VIEW COMPARISON:  09/20/2018 FINDINGS: Extensive airspace disease asymmetric to the right. Chronic volume loss on the right pleural thickening. There is a background of COPD with left lung hyperinflation. Biventricular ICD/pacer leads from the left in stable position. Stable cardiomegaly. Status post CABG IMPRESSION: 1. Extensive airspace disease on the right consistent with pneumonia in the appropriate clinical setting. 2. COPD. Electronically Signed   By: Monte Fantasia M.D.   On: 10/04/2018 10:09    EKG: Independently reviewed.  NSR with rate 84; nonspecific ST changes with no evidence of acute ischemia; NSCSLT   Labs on Admission: I have personally reviewed the available labs and imaging studies at the time of the admission.  Pertinent labs:   K+ 6.1, 4.5 CO2 18 Glucose 110 BUN 16/Creatinine 1.45/GFR 45 Albumin 2.5 BNP 485.2 Lactate 3.83, 1.97 WBC 12.5 INR 3.66  Assessment/Plan Principal Problem:   Severe sepsis with septic shock (HCC) Active Problems:   Essential hypertension   Automatic implantable cardioverter-defibrillator in situ   OSA (obstructive sleep apnea)   Diabetes mellitus with diabetic neuropathy, with long-term current use of insulin (HCC)   Chronic systolic heart failure ([place])   Long term (current) use of anticoagulants [Z79.01]   A-fib (HCC)   Hypothyroidism   HCAP (healthcare-associated pneumonia)   Acute  on chronic respiratory failure with hypoxia (HCC)   Septic shock with acute on chronic respiratory failure associated with HCAP -Patient presented with acute on chronic respiratory failure with shock -Initial plan was for ICU admission but after discussion with PCCM, patient is DNR and we will not use pressors -As such, patient will be admitted to SDU on the [facility] service. -He was recently hospitalized from 11/24-27 with multifocal PNA; this appears to have progressed to his entire right lung by imaging at this time - IV Vancomycin and zosyn as per PCCM - will get Procalcitonin and trend lactic acid level per sepsis protocol - IVF: 2.5L of NS bolus in ED, followed by 100 mL per hour of NS - we may continue to bolus for hypotension but the patient will be at risk for significant volume overload if we continue to bolus indefinitely -Will give solu-cortef 50 mg IV q6h x 24 hours - Follow up blood culture x2, sputum culture - Zofran for Nausea   Chronic systolic CHF -AICD in place -11/26 echo with EF 30-35% and diffuse hypokinesis -Clearly, this is a concern given his need for IVF -His status is tenuous at this time  Stage 3 CKD -Appears to be roughly at baseline  DM -Will check A1c -hold Glucophage -Cover with moderate-scale SSI  Afib -Holding Coreg, which is used for his rate control - will monitor -Continue Coumadin, dosing per pharmacy  HTN -Hold Coreg due to hypotension associated with shock  Hypothyroidism -Check TSH -Continue Synthroid at current dose for now  OSA -Continue CPAP   DVT prophylaxis: Coumadin Code Status:  DNR - confirmed with patient/family Family Communication: Multiple family members were present throughout evaluation  Disposition Plan:  Home once clinically improved Consults called: PCCM Admission status: Admit - It is my clinical opinion that admission to INPATIENT is reasonable and necessary because of the expectation that this patient will  require hospital care that crosses at least 2 midnights to treat this condition based on the medical complexity of the problems presented.  Given the aforementioned information, the predictability of an adverse outcome is felt to be significant.     Karmen Bongo MD [facility]  If note is complete, please contact covering daytime or nighttime physician. [email or web address] Password [facility]  10/04/2018, 5:48 PM

## 2018-10-05 DIAGNOSIS — J9621 Acute and chronic respiratory failure with hypoxia: Secondary | ICD-10-CM

## 2018-10-05 DIAGNOSIS — I10 Essential (primary) hypertension: Secondary | ICD-10-CM

## 2018-10-05 DIAGNOSIS — I5022 Chronic systolic (congestive) heart failure: Secondary | ICD-10-CM

## 2018-10-05 LAB — BASIC METABOLIC PANEL
Anion gap: 7 (ref 5–15)
BUN: 23 mg/dL (ref 8–23)
CO2: 20 mmol/L — ABNORMAL LOW (ref 22–32)
Calcium: 7.9 mg/dL — ABNORMAL LOW (ref 8.9–10.3)
Chloride: 111 mmol/L (ref 98–111)
Creatinine, Ser: 1.5 mg/dL — ABNORMAL HIGH (ref 0.61–1.24)
GFR calc Af Amer: 50 mL/min — ABNORMAL LOW (ref >60)
GFR calc non Af Amer: 43 mL/min — ABNORMAL LOW (ref >60)
Glucose, Bld: 124 mg/dL — ABNORMAL HIGH (ref 70–99)
Potassium: 4.6 mmol/L (ref 3.5–5.1)
Sodium: 138 mmol/L (ref 135–145)

## 2018-10-05 LAB — CBC WITH DIFFERENTIAL/PLATELET
Abs Immature Granulocytes: 0.07 10*3/uL (ref 0.00–0.07)
Basophils Absolute: 0 10*3/uL (ref 0.0–0.1)
Basophils Relative: 0 %
Eosinophils Absolute: 0 10*3/uL (ref 0.0–0.5)
Eosinophils Relative: 0 %
HCT: 33.9 % — ABNORMAL LOW (ref 39.0–52.0)
Hemoglobin: 10.4 g/dL — ABNORMAL LOW (ref 13.0–17.0)
Immature Granulocytes: 1 %
Lymphocytes Relative: 6 %
Lymphs Abs: 0.8 10*3/uL (ref 0.7–4.0)
MCH: 30.1 pg (ref 26.0–34.0)
MCHC: 30.7 g/dL (ref 30.0–36.0)
MCV: 98.3 fL (ref 80.0–100.0)
Monocytes Absolute: 0.5 10*3/uL (ref 0.1–1.0)
Monocytes Relative: 4 %
Neutro Abs: 12.8 10*3/uL — ABNORMAL HIGH (ref 1.7–7.7)
Neutrophils Relative %: 89 %
Platelets: 148 10*3/uL — ABNORMAL LOW (ref 150–400)
RBC: 3.45 MIL/uL — ABNORMAL LOW (ref 4.22–5.81)
RDW: 14.9 % (ref 11.5–15.5)
WBC Morphology: INCREASED
WBC: 14.2 10*3/uL — ABNORMAL HIGH (ref 4.0–10.5)
nRBC: 0 % (ref 0.0–0.2)

## 2018-10-05 LAB — GLUCOSE, CAPILLARY
Glucose-Capillary: 98 mg/dL (ref 70–99)
Glucose-Capillary: 206 mg/dL — ABNORMAL HIGH (ref 70–99)
Glucose-Capillary: 214 mg/dL — ABNORMAL HIGH (ref 70–99)
Glucose-Capillary: 180 mg/dL — ABNORMAL HIGH (ref 70–99)

## 2018-10-05 LAB — PROCALCITONIN: Procalcitonin: 7.54 ng/mL

## 2018-10-05 LAB — PROTIME-INR
INR: 3.63
Prothrombin Time: 35.6 seconds — ABNORMAL HIGH (ref 11.4–15.2)

## 2018-10-05 LAB — EXPECTORATED SPUTUM ASSESSMENT W GRAM STAIN, RFLX TO RESP C

## 2018-10-05 MED ORDER — POTASSIUM CHLORIDE CRYS ER 10 MEQ PO TBCR
20.0000 meq | EXTENDED_RELEASE_TABLET | Freq: Every day | ORAL | Status: DC
  Administered 2018-10-05 – 2018-10-06 (×2): 20 meq via ORAL
  Filled 2018-10-05 (×5): qty 2

## 2018-10-05 MED ORDER — CARVEDILOL 3.125 MG PO TABS
3.1250 mg | ORAL_TABLET | Freq: Two times a day (BID) | ORAL | Status: DC
  Administered 2018-10-05 – 2018-10-06 (×4): 3.125 mg via ORAL
  Filled 2018-10-05 (×4): qty 1

## 2018-10-05 MED ORDER — ADULT MULTIVITAMIN W/MINERALS CH
1.0000 | ORAL_TABLET | Freq: Every day | ORAL | Status: DC
  Administered 2018-10-05 – 2018-10-06 (×2): 1 via ORAL
  Filled 2018-10-05 (×2): qty 1

## 2018-10-05 MED ORDER — FUROSEMIDE 40 MG PO TABS
40.0000 mg | ORAL_TABLET | Freq: Every day | ORAL | Status: DC
  Administered 2018-10-05 – 2018-10-06 (×2): 40 mg via ORAL
  Filled 2018-10-05 (×2): qty 1

## 2018-10-05 NOTE — Progress Notes (Signed)
PROGRESS NOTE        PATIENT DETAILS Name: [name] Age: 84 y.o. Sex: male Date of Birth: 1933/11/17 Admit Date: 10/04/2018 Admitting Physician Karmen Bongo, MD UMP:NTIRWER, Jenny Reichmann, MD  Brief Narrative: Patient is a 84 y.o. male A. fib on anticoagulation, chronic systolic heart failure, COPD, hypertension, dyslipidemia-recent hospitalization for pneumonia from 11/24-11/27-brought into the hospital on 12/11 for acute hypoxic respiratory failure and septic shock in the setting of worsening right-sided pneumonia.  See below for further details  Subjective: Feels a lot better-blood pressure stable-off oxygen this morning.  Denies any shortness of breath-looks a whole lot better than what was described in the H&P yesterday.  Assessment/Plan: Septic shock with acute hypoxic respiratory failure secondary to right lobar pneumonia: Clinically improved-sepsis pathophysiology is markedly removed-blood pressure is now stable-liberated of BiPAP last night-now on room air this morning.  Stop IV vancomycin-continue Zosyn for now.    AKI: Hemodynamically mediated-should improve with continued supportive care.  Chronic systolic heart failure (EF 30-35% by TTE on 09/19/2018): Compensated-resume Lasix now that her hemodynamics are more stable.  Chronic atrial fibrillation: Rate controlled-continue amiodarone.  Resume low-dose beta-blocker-Coumadin per pharmacy-INR slightly supratherapeutic.  Paced rhythm on telemetry.  DM-2: CBGs stable with SSI.  Hypothyroidism: Continue Synthroid  Hypertension: Blood pressure rebounding-starting low-dose Coreg.  COPD: Stable-continue with bronchodilators.  OSA: Continue CPAP nightly  DVT Prophylaxis: Full dose anticoagulation withCoumadin  Code Status:  DNR  Family Communication: Spouse over the phone  Disposition Plan: Remain inpatient-for several more days of hospitalization before consideration of  discharge.  Antimicrobial agents: Anti-infectives (From admission, onward)   Start     Dose/Rate Route Frequency Ordered Stop   10/05/18 2300  vancomycin (VANCOCIN) 1,750 mg in sodium chloride 0.9 % 500 mL IVPB     1,750 mg 250 mL/hr over 120 Minutes Intravenous Every 36 hours 10/04/18 1142     10/05/18 0800  ceFEPIme (MAXIPIME) 2 g in sodium chloride 0.9 % 100 mL IVPB  Status:  Discontinued     2 g 200 mL/hr over 30 Minutes Intravenous Every 24 hours 10/04/18 1421 10/04/18 1802   10/05/18 0100  piperacillin-tazobactam (ZOSYN) IVPB 3.375 g     3.375 g 12.5 mL/hr over 240 Minutes Intravenous Every 8 hours 10/04/18 1819     10/04/18 1900  piperacillin-tazobactam (ZOSYN) IVPB 3.375 g     3.375 g 100 mL/hr over 30 Minutes Intravenous  Once 10/04/18 1819 10/04/18 2120   10/04/18 1045  vancomycin (VANCOCIN) 1,750 mg in sodium chloride 0.9 % 500 mL IVPB     1,750 mg 250 mL/hr over 120 Minutes Intravenous  Once 10/04/18 1032 10/04/18 1312   10/04/18 1030  ceFEPIme (MAXIPIME) 1 g in sodium chloride 0.9 % 100 mL IVPB     1 g 200 mL/hr over 30 Minutes Intravenous  Once 10/04/18 1020 10/04/18 1233      Procedures: None  CONSULTS:  None  Time spent: 25- minutes-Greater than 50% of this time was spent in counseling, explanation of diagnosis, planning of further management, and coordination of care.  MEDICATIONS: Scheduled Meds: . amiodarone  200 mg Oral q morning - 10a  . atorvastatin  80 mg Oral BH-q7a  . cholecalciferol  2,000 Units Oral QPM  . feeding supplement (ENSURE ENLIVE)  237 mL Oral BID BM  . ferrous sulfate  325 mg Oral QPM  .  fluticasone  2 spray Each Nare [id number]  . fluticasone furoate-vilanterol  1 puff Inhalation Daily   And  . umeclidinium bromide  1 puff Inhalation Daily  . insulin aspart  0-5 Units Subcutaneous QHS  . insulin aspart  0-9 Units Subcutaneous TID WC  . levothyroxine  100 mcg Oral QAC breakfast  . multivitamin with minerals  1 tablet Oral Daily  .  senna  1 tablet Oral BID  . vitamin B-12  1,000 mcg Oral Daily  . Warfarin - Pharmacist Dosing Inpatient   Does not apply q1800   Continuous Infusions: . sodium chloride 100 mL/hr at 10/04/18 1754  . piperacillin-tazobactam (ZOSYN)  IV 3.375 g (10/05/18 1000)  . vancomycin     PRN Meds:.albuterol, bisacodyl, dextromethorphan-guaiFENesin, traMADol   PHYSICAL EXAM: Vital signs: Vitals:   10/05/18 0455 10/05/18 0456 10/05/18 0755 10/05/18 0919  BP:      Pulse:    81  Resp:    19  Temp:   97.9 F (36.6 C)   TempSrc:  Oral Oral   SpO2:    98%  Weight:      Height: 5' 7.99" (1.727 m)      Filed Weights   10/04/18 0944  Weight: 89 kg   Body mass index is 29.84 kg/m.   General appearance :Awake, alert, not in any distress.  Eyes:.Pink conjunctiva HEENT: Atraumatic and Normocephalic Neck: supple Resp:Good air entry bilaterally, no added sounds CVS: S1 S2 regular GI: Bowel sounds present, Non tender and not distended with no gaurding, rigidity or rebound.No organomegaly Extremities: B/L Lower Ext shows no edema, both legs are warm to touch Neurology:  speech clear,Non focal, sensation is grossly intact. Musculoskeletal:No digital cyanosis Skin:No Rash, warm and dry Wounds:N/A  I have personally reviewed following labs and imaging studies  LABORATORY DATA: CBC: Recent Labs  Lab 10/04/18 1003 10/05/18 0441  WBC 12.5* 14.2*  NEUTROABS 11.0* 12.8*  HGB 13.1 10.4*  HCT 41.7 33.9*  MCV 98.8 98.3  PLT 160 148*    Basic Metabolic Panel: Recent Labs  Lab 10/04/18 1003 10/04/18 1110 10/05/18 0441  NA 137  --  138  K 6.1* 4.5 4.6  CL 109  --  111  CO2 18*  --  20*  GLUCOSE 110*  --  124*  BUN 16  --  23  CREATININE 1.45*  --  1.50*  CALCIUM 8.3*  --  7.9*    GFR: Estimated Creatinine Clearance: 41.1 mL/min (A) (by C-G formula based on SCr of 1.5 mg/dL (H)).  Liver Function Tests: Recent Labs  Lab 10/04/18 1003  AST 37  ALT 18  ALKPHOS 58  BILITOT  1.6*  PROT 5.0*  ALBUMIN 2.5*   No results for input(s): LIPASE, AMYLASE in the last 168 hours. No results for input(s): AMMONIA in the last 168 hours.  Coagulation Profile: Recent Labs  Lab 10/02/18 0927 10/04/18 1003 10/05/18 1004  INR 2.5 3.66 3.63    Cardiac Enzymes: No results for input(s): CKTOTAL, CKMB, CKMBINDEX, TROPONINI in the last 168 hours.  BNP (last 3 results) Recent Labs    02/27/18 1045 03/06/18 0752 03/13/18 0805  PROBNP 10,884* 12,873* 5,305*    HbA1C: No results for input(s): HGBA1C in the last 72 hours.  CBG: Recent Labs  Lab 10/04/18 1807 10/04/18 2147 10/05/18 0811 10/05/18 1204  GLUCAP 129* 82 98 206*    Lipid Profile: No results for input(s): CHOL, HDL, LDLCALC, TRIG, CHOLHDL, LDLDIRECT in the last 72 hours.  Thyroid  Function Tests: No results for input(s): TSH, T4TOTAL, FREET4, T3FREE, THYROIDAB in the last 72 hours.  Anemia Panel: No results for input(s): VITAMINB12, FOLATE, FERRITIN, TIBC, IRON, RETICCTPCT in the last 72 hours.  Urine analysis:    Component Value Date/Time   COLORURINE YELLOW 09/17/2018 0850   APPEARANCEUR CLEAR 09/17/2018 0850   LABSPEC 1.006 09/17/2018 0850   PHURINE 5.0 09/17/2018 0850   GLUCOSEU NEGATIVE 09/17/2018 0850   HGBUR NEGATIVE 09/17/2018 0850   BILIRUBINUR NEGATIVE 09/17/2018 0850   KETONESUR NEGATIVE 09/17/2018 0850   PROTEINUR NEGATIVE 09/17/2018 0850   UROBILINOGEN 0.2 11/28/2008 0325   NITRITE NEGATIVE 09/17/2018 0850   LEUKOCYTESUR NEGATIVE 09/17/2018 0850    Sepsis Labs: Lactic Acid, Venous    Component Value Date/Time   LATICACIDVEN 1.3 10/04/2018 2226    MICROBIOLOGY: Recent Results (from the past 240 hour(s))  MRSA PCR Screening     Status: None   Collection Time: 10/04/18  6:39 PM  Result Value Ref Range Status   MRSA by PCR NEGATIVE NEGATIVE Final    Comment:        The GeneXpert MRSA Assay (FDA approved for NASAL specimens only), is one component of  a comprehensive MRSA colonization surveillance program. It is not intended to diagnose MRSA infection nor to guide or monitor treatment for MRSA infections. Performed at [facility], [place] [address] [place], [place] [address]   Culture, sputum-assessment     Status: None   Collection Time: 10/05/18  8:51 AM  Result Value Ref Range Status   Specimen Description SPUTUM  Final   Special Requests NONE  Final   Sputum evaluation   Final    THIS SPECIMEN IS ACCEPTABLE FOR SPUTUM CULTURE Performed at [facility], [address] [place], [place] [address]    Report Status 10/05/2018 FINAL  Final  Culture, respiratory     Status: None (Preliminary result)   Collection Time: 10/05/18  8:51 AM  Result Value Ref Range Status   Specimen Description SPUTUM  Final   Special Requests NONE Reflexed from [id number]  Final   Gram Stain   Final    MODERATE WBC PRESENT, PREDOMINANTLY PMN FEW GRAM POSITIVE RODS FEW YEAST RARE GRAM NEGATIVE RODS Performed at [facility], [place] [address], [place],  [address]    Culture PENDING  Incomplete   Report Status PENDING  Incomplete    RADIOLOGY STUDIES/RESULTS: Dg Chest 2 View  Result Date: 09/20/2018 CLINICAL DATA:  Chronic cough EXAM: CHEST - 2 VIEW COMPARISON:  09/17/2018 FINDINGS: Cardiac shadow is stable. Defibrillator is again noted and stable. The lungs are hyperinflated with bibasilar atelectatic changes slightly worse on the right than the left. No sizable effusion is seen. No acute bony abnormality is noted. No pneumothorax is seen IMPRESSION: Bibasilar atelectasis increased from the prior exam. Electronically Signed   By: [name] M.D.   On: 09/20/2018 08:51   Dg Chest 2 View  Result Date: 09/17/2018 CLINICAL DATA:  Dizziness and weakness for few days, vomited on Saturday morning, legs gave out, history atrial fibrillation, CHF, COPD, coronary artery disease post MI and CABG, type II diabetes mellitus EXAM: CHEST -  2 VIEW COMPARISON:  08/03/2018 FINDINGS: LEFT subclavian AICD with leads projecting at RIGHT atrium, RIGHT ventricle, and coronary sinus. Enlargement of cardiac silhouette. Mediastinal contours and pulmonary vascularity normal. Atherosclerotic calcification aorta. RIGHT basilar atelectasis. Accentuated markings in the upper lobes slightly greater on RIGHT, unchanged. No acute infiltrate or pneumothorax. Tiny  pleural effusions blunt the posterior costophrenic angles. Bones demineralized with chronic compression fracture of a vertebra at the thoracolumbar junction. IMPRESSION: Enlargement of cardiac silhouette post CABG and AICD. Bibasilar atelectasis greater on RIGHT with tiny BILATERAL pleural effusions. Electronically Signed   By: Lavonia Dana M.D.   On: 09/17/2018 09:19   Ct Chest W Contrast  Result Date: 09/17/2018 CLINICAL DATA:  84 year old male with persistent cough and shortness of breath. EXAM: CT CHEST WITH CONTRAST TECHNIQUE: Multidetector CT imaging of the chest was performed during intravenous contrast administration. CONTRAST:  53mL OMNIPAQUE IOHEXOL 300 MG/ML  SOLN COMPARISON:  01/15/2016 CT FINDINGS: Cardiovascular: Cardiomegaly, CABG changes and ICD again noted. Aortic atherosclerotic scratch de coronary artery and aortic atherosclerotic calcifications again noted. There is no evidence of thoracic aortic aneurysm or dissection. No pericardial effusion. Mediastinum/Nodes: No enlarged mediastinal, hilar, or axillary lymph nodes. Thyroid gland, trachea, and esophagus demonstrate no significant findings. Lungs/Pleura: A 1.1 x 1.3 x 1.9 cm irregular LEFT UPPER lobe nodule (series 4:39) is identified. Mild ground-glass/airspace/tree-in-bud opacities within the anterior RIGHT UPPER lobe, posterior RIGHT UPPER lobe and within the RIGHT LOWER lobe identified likely representing infection. Moderate centrilobular emphysema is identified. Mild peribronchial thickening is present. No pleural effusion or  pneumothorax. Upper Abdomen: A 1.8 cm gallstone is present. No CT evidence of acute cholecystitis. Musculoskeletal: No acute or suspicious bony abnormalities noted. Remote rib fractures and L1 fracture again noted. IMPRESSION: 1. Irregular 1.1 x 1.3 x 1.9 cm LEFT UPPER lobe nodule worrisome for malignancy. Consider one of the following for both low-risk and high-risk individuals: (a) repeat chest CT in 3 months, (b) follow-up PET-CT, or (c) tissue sampling. This recommendation follows the consensus statement: Guidelines for Management of Incidental Pulmonary Nodules Detected on CT Images: From the Fleischner Society 2017; Radiology 2017; 284:228-243. 2. Mild ground-glass/airspace/tree-in-bud opacities within the RIGHT UPPER and LOWER lobe likely representing infection. 3. Cardiomegaly and coronary artery disease.  Status post CABG. 4. Cholelithiasis 5. Aortic Atherosclerosis (ICD10-I70.0) and Emphysema (ICD10-J43.9). Electronically Signed   By: Margarette Canada M.D.   On: 09/17/2018 11:41   US Renal  Result Date: 09/19/2018 CLINICAL DATA:  Acute kidney injury. EXAM: RENAL / URINARY TRACT ULTRASOUND COMPLETE COMPARISON:  Abdominopelvic CT 05/05/2018 FINDINGS: Right Kidney: Renal measurements: 10.7 x 6.4 x 5.6 cm = volume: 199.9 mL. Slight renal cortical thinning. Echogenicity within normal limits. No mass or hydronephrosis visualized. Left Kidney: Renal measurements: 9.9 x 5.7 x 5.2 cm = volume: 152.6 mL. Slight renal cortical thinning. Echogenicity within normal limits. No mass or hydronephrosis visualized. Bladder: Appears normal for degree of bladder distention. IMPRESSION: No obstructive uropathy.  No explanation for acute kidney injury. Electronically Signed   By: Keith Rake M.D.   On: 09/19/2018 05:44   Dg Chest Port 1 View  Result Date: 10/04/2018 CLINICAL DATA:  Severe shortness of breath EXAM: PORTABLE CHEST 1 VIEW COMPARISON:  09/20/2018 FINDINGS: Extensive airspace disease asymmetric to the  right. Chronic volume loss on the right pleural thickening. There is a background of COPD with left lung hyperinflation. Biventricular ICD/pacer leads from the left in stable position. Stable cardiomegaly. Status post CABG IMPRESSION: 1. Extensive airspace disease on the right consistent with pneumonia in the appropriate clinical setting. 2. COPD. Electronically Signed   By: Monte Fantasia M.D.   On: 10/04/2018 10:09     LOS: 1 day   Oren Binet, MD  [facility]  If 7PM-7AM, please contact night-coverage  Please page via [email or web address]-Password [email or web address]-click on MD name  and type text message  10/05/2018, 1:43 PM

## 2018-10-05 NOTE — Progress Notes (Signed)
ANTICOAGULATION CONSULT NOTE - follow up   Pharmacy Consult for warfarin Indication: atrial fibrillation  Allergies  Allergen Reactions  . Aldactone [Spironolactone] Other (See Comments)    Hyperkalemia  reported by Dr. [name] - pt is currently taking 12.5 mg daily -November 2017 per medication list by same MD    Patient Measurements: Height: 5' 7.99" (172.7 cm) Weight: 196 lb 3.4 oz (89 kg) IBW/kg (Calculated) : 68.38 Heparin Dosing Weight: 86.6 kg  Vital Signs: Temp: 97.9 F (36.6 C) (12/12 0755) Temp Source: Oral (12/12 0755) Pulse Rate: 81 (12/12 0919)  Labs: Recent Labs    10/04/18 1003 10/05/18 0441 10/05/18 1004  HGB 13.1 10.4*  --   HCT 41.7 33.9*  --   PLT 160 148*  --   LABPROT 35.8*  --  35.6*  INR 3.66  --  3.63  CREATININE 1.45* 1.50*  --     Estimated Creatinine Clearance: 41.1 mL/min (A) (by C-G formula based on SCr of 1.5 mg/dL (H)).   Medical History: Past Medical History:  Diagnosis Date  . Atrial fibrillation ([place])    persistent, previously seen at [facility] and placed on amiodarone  . Benign prostatic hypertrophy   . CAD (coronary artery disease)    multivessel s/p inferolateral wall MI with subsequent CABG 11/1998.  Cath 2009 with Patent grafts  . Cleft palate   . COPD with emphysema ([place]) 04/01/2010  . DM (diabetes mellitus), type 2 ([place])   . Dyspnea   . GERD (gastroesophageal reflux disease)   . HTN (hypertension)   . Hyperlipidemia   . Hypothyroidism   . Iron deficiency anemia   . Ischemic dilated cardiomyopathy ([place])    EF 35-40% by MUGA 6/11  . Myocardial infarction ([place])   . Nasal septal deviation   . Nephrolithiasis   . OSA (obstructive sleep apnea)   . PAF (paroxysmal atrial fibrillation) ([place])   . Peripheral arterial disease ([facility])    left subclavian artery stenosis  . PNA (pneumonia)   . Psoriasis   . Seborrheic keratosis   . Stroke ([place])   . Systolic congestive heart failure ([place]) 2009   s/p BiV ICD implantation by Dr  Leonia Reeves (MDT)    Medications:  Scheduled:  . amiodarone  200 mg Oral q morning - 10a  . atorvastatin  80 mg Oral BH-q7a  . cholecalciferol  2,000 Units Oral QPM  . feeding supplement (ENSURE ENLIVE)  237 mL Oral BID BM  . ferrous sulfate  325 mg Oral QPM  . fluticasone  2 spray Each Nare Q0600  . fluticasone furoate-vilanterol  1 puff Inhalation Daily   And  . umeclidinium bromide  1 puff Inhalation Daily  . hydrocortisone sod succinate (SOLU-CORTEF) inj  50 mg Intravenous Q6H  . insulin aspart  0-5 Units Subcutaneous QHS  . insulin aspart  0-9 Units Subcutaneous TID WC  . levothyroxine  100 mcg Oral QAC breakfast  . senna  1 tablet Oral BID  . vitamin B-12  1,000 mcg Oral Daily  . Warfarin - Pharmacist Dosing Inpatient   Does not apply q1800    Assessment: 79 yof recently being treated with PNA- now presenting with SOB and productive cough. On warfarin PTA for hx of Afib (last dose on 12/10).   INR today 3.63, remains supra-therapeutic. Warfarin dose held 10/04/18. Hgb down to 10.4 and pltc down to 148k. No s/sx of bleeding.  Currently on zosyn and vancomycin, might impact warfarin sensitivity.   PTA regimen is 5  mg daily except 2.5 mg on Wed/Saturday.   Goal of Therapy:  INR 2-3 Monitor platelets by anticoagulation protocol: Yes   Plan:  Hold warfarin therapy for tonight Monitor daily INR and CBC Monitor for s/sx of bleeding  Nicole Cella, RPh Clinical Pharmacist  Phone: [phone] Please check AMION for all [facility] phone numbers After 10:00 PM, call [place] [phone] 10/05/2018,11:40 AM

## 2018-10-05 NOTE — Plan of Care (Signed)
  Problem: Education: Goal: Knowledge of General Education information will improve Description Including pain rating scale, medication(s)/side effects and non-pharmacologic comfort measures 10/05/2018 0452 by Wilder Glade, RN Outcome: Progressing 10/05/2018 0450 by Wilder Glade, RN Outcome: Progressing

## 2018-10-05 NOTE — Progress Notes (Signed)
Initial Nutrition Assessment  DOCUMENTATION CODES:   Non-severe (moderate) malnutrition in context of chronic illness  INTERVENTION:   - Continue Ensure Enlive po BID, each supplement provides 350 kcal and 20 grams of protein  Note: pt with malnutrition and would benefit from nutrient0dense supplement. One Ensure Enlive supplement provides 350 kcals, 20 grams protein, and 44-45 grams of carbohydrate vs one Glucerna shake supplement which provides 220 kcals, 10 grams of protein, and 26 grams of carbohydrate. Given pt's hx of DM, RD will continue to monitor PO intake and CBG's, and adjust supplement regimen as appropriate.  - Provided education regarding the importance of adequate kcal and protein intake in maintaining lean muscle mass  - MVI with minerals daily  NUTRITION DIAGNOSIS:   Moderate Malnutrition related to chronic illness (COPD) as evidenced by mild fat depletion, moderate fat depletion, mild muscle depletion, moderate muscle depletion.  GOAL:   Patient will meet greater than or equal to 90% of their needs  MONITOR:   PO intake, Supplement acceptance, Labs, Weight trends, I & O's, Skin  REASON FOR ASSESSMENT:   Malnutrition Screening Tool    ASSESSMENT:   84 year old male who presented to the ED on 12/11 with respiratory distress. Pt recently discharged from the hospital on 11/27 for PNA. PMH significant for CAD, COPD, DM, GERD, HTN, HLD, atrial fibrillation. Pt admitted with HCAP.   Pt with severe sepsis with septic shock.  Pt in recliner at time of visit. Pt's wife in room.  Pt in good spirits and states that he ate 100% of breakfast meal this morning. Pt shares, however, that his appetite has been poor over the past 1 year "since I was in the hospital last year." Pt is unsure what brought this on. Pt shares that he still eats 3 meals a daily and "junk" in between meals. Pt's wife notes that pt's portions are much smaller than they used to be (now eats  "tablespoons" of food at meals).  Breakfast: grits with butter or 2 pieces of toast with gravy Lunch: banana sandwich or meat sandwich Dinner: meat with greens and cabbage Snacks: Little Debbie cakes, crackers and peanut butter  Pt states that he may drink an oral nutrition supplement "every once in a while." RD encouraged pt to drink 1 daily and provided education regarding importance of adequate PO intake with a focus on protein in maintaining lean muscle mass. Discussed the importance of lean muscle mass maintenance in older age. Pt and wife expressed understanding.  Pt confirms that he has been losing weight over the past year. Pt reports his UBW as 260 lbs. Per weight history in chart, pt with 7.2 kg weight loss over the past 9 months. This is a 7.5% weight loss which is not significant for timeframe.  Medications reviewed and include: cholecalciferol 2000 units daily, Ensure Enlive BID, ferrous sulfate 325 mg daily, SSI, levothyroxine, Senokot, vitamin B-12 1000 mcg daily, warfarin, IV antibiotics  Labs reviewed: creatinine 1.50 (H) CBG's: 206, 98, 82, 129  NUTRITION - FOCUSED PHYSICAL EXAM:    Most Recent Value  Orbital Region  Mild depletion  Upper Arm Region  Moderate depletion  Thoracic and Lumbar Region  No depletion  Buccal Region  Mild depletion  Temple Region  Mild depletion  Clavicle Bone Region  Mild depletion  Clavicle and Acromion Bone Region  Moderate depletion  Scapular Bone Region  Mild depletion  Dorsal Hand  No depletion  Patellar Region  Mild depletion  Anterior Thigh Region  Mild  depletion  Posterior Calf Region  Mild depletion  Edema (RD Assessment)  None  Hair  Reviewed  Eyes  Reviewed  Mouth  Reviewed  Skin  Reviewed  Nails  Reviewed       Diet Order:   Diet Order            Diet heart healthy/carb modified Room service appropriate? Yes; Fluid consistency: Thin  Diet effective now              EDUCATION NEEDS:   Education needs have  been addressed  Skin:  Skin Assessment: Skin Integrity Issues: Stage I: mid-buttocks  Last BM:  12/12 (large type 4)  Height:   Ht Readings from Last 1 Encounters:  10/05/18 5' 7.99" (1.727 m)    Weight:   Wt Readings from Last 1 Encounters:  10/04/18 89 kg    Ideal Body Weight:  70 kg  BMI:  Body mass index is 29.84 kg/m.  Estimated Nutritional Needs:   Kcal:  1850-2050  Protein:  95-110 grams  Fluid:  >/= 1.9 L    Gaynell Face, MS, RD, LDN Inpatient Clinical Dietitian Pager: [phone] Weekend/After Hours: [phone]

## 2018-10-06 LAB — BASIC METABOLIC PANEL
Anion gap: 11 (ref 5–15)
BUN: 28 mg/dL — ABNORMAL HIGH (ref 8–23)
CO2: 22 mmol/L (ref 22–32)
Calcium: 8.2 mg/dL — ABNORMAL LOW (ref 8.9–10.3)
Chloride: 105 mmol/L (ref 98–111)
Creatinine, Ser: 1.33 mg/dL — ABNORMAL HIGH (ref 0.61–1.24)
GFR calc Af Amer: 57 mL/min — ABNORMAL LOW (ref >60)
GFR calc non Af Amer: 49 mL/min — ABNORMAL LOW (ref >60)
Glucose, Bld: 127 mg/dL — ABNORMAL HIGH (ref 70–99)
Potassium: 3.5 mmol/L (ref 3.5–5.1)
Sodium: 138 mmol/L (ref 135–145)

## 2018-10-06 LAB — CBC
HCT: 32.9 % — ABNORMAL LOW (ref 39.0–52.0)
Hemoglobin: 10.7 g/dL — ABNORMAL LOW (ref 13.0–17.0)
MCH: 30.7 pg (ref 26.0–34.0)
MCHC: 32.5 g/dL (ref 30.0–36.0)
MCV: 94.3 fL (ref 80.0–100.0)
Platelets: 169 10*3/uL (ref 150–400)
RBC: 3.49 MIL/uL — ABNORMAL LOW (ref 4.22–5.81)
RDW: 14.5 % (ref 11.5–15.5)
WBC: 13.2 10*3/uL — ABNORMAL HIGH (ref 4.0–10.5)
nRBC: 0 % (ref 0.0–0.2)

## 2018-10-06 LAB — GLUCOSE, CAPILLARY
Glucose-Capillary: 108 mg/dL — ABNORMAL HIGH (ref 70–99)
Glucose-Capillary: 201 mg/dL — ABNORMAL HIGH (ref 70–99)
Glucose-Capillary: 167 mg/dL — ABNORMAL HIGH (ref 70–99)
Glucose-Capillary: 199 mg/dL — ABNORMAL HIGH (ref 70–99)

## 2018-10-06 LAB — PROCALCITONIN: Procalcitonin: 5.03 ng/mL

## 2018-10-06 LAB — PROTIME-INR
INR: 3.65
Prothrombin Time: 35.8 seconds — ABNORMAL HIGH (ref 11.4–15.2)

## 2018-10-06 MED ORDER — AMOXICILLIN-POT CLAVULANATE 500-125 MG PO TABS: 1.0000 | ORAL_TABLET | Freq: Three times a day (TID) | ORAL | 0 refills | Status: DC

## 2018-10-06 MED FILL — AMOX-CLAV 875-125 MG TABLET: 875-125 | 3 days supply | Qty: 8 | Fill #0

## 2018-10-06 NOTE — Progress Notes (Signed)
Patient refuse NIV for the night. Machine is in room if patient changes his mind

## 2018-10-06 NOTE — Progress Notes (Signed)
ANTICOAGULATION CONSULT NOTE - follow up   Pharmacy Consult for warfarin Indication: atrial fibrillation  Allergies  Allergen Reactions  . Aldactone [Spironolactone] Other (See Comments)    Hyperkalemia  reported by Dr. [name] - pt is currently taking 12.5 mg daily -November 2017 per medication list by same MD    Patient Measurements: Height: 5' 7.99" (172.7 cm) Weight: 196 lb 3.4 oz (89 kg) IBW/kg (Calculated) : 68.38 Heparin Dosing Weight: 86.6 kg  Vital Signs: Temp: 97.6 F (36.4 C) (12/13 0501) Temp Source: Oral (12/13 0501) BP: 111/66 (12/13 0501) Pulse Rate: 86 (12/13 0831)  Labs: Recent Labs    10/04/18 1003 10/05/18 0441 10/05/18 1004 10/06/18 0408  HGB 13.1 10.4*  --  10.7*  HCT 41.7 33.9*  --  32.9*  PLT 160 148*  --  169  LABPROT 35.8*  --  35.6* 35.8*  INR 3.66  --  3.63 3.65  CREATININE 1.45* 1.50*  --  1.33*    Estimated Creatinine Clearance: 46.4 mL/min (A) (by C-G formula based on SCr of 1.33 mg/dL (H)).   Medical History: Past Medical History:  Diagnosis Date  . Atrial fibrillation ([place])    persistent, previously seen at [facility] and placed on amiodarone  . Benign prostatic hypertrophy   . CAD (coronary artery disease)    multivessel s/p inferolateral wall MI with subsequent CABG 11/1998.  Cath 2009 with Patent grafts  . Cleft palate   . COPD with emphysema ([place]) 04/01/2010  . DM (diabetes mellitus), type 2 ([place])   . Dyspnea   . GERD (gastroesophageal reflux disease)   . HTN (hypertension)   . Hyperlipidemia   . Hypothyroidism   . Iron deficiency anemia   . Ischemic dilated cardiomyopathy ([place])    EF 35-40% by MUGA 6/11  . Myocardial infarction ([place])   . Nasal septal deviation   . Nephrolithiasis   . OSA (obstructive sleep apnea)   . PAF (paroxysmal atrial fibrillation) ([place])   . Peripheral arterial disease (HCC)    left subclavian artery stenosis  . PNA (pneumonia)   . Psoriasis   . Seborrheic keratosis   . Stroke ([place])   .  Systolic congestive heart failure ([place]) 2009   s/p BiV ICD implantation by Dr Leonia Reeves (MDT)    Medications:  Scheduled:  . amiodarone  200 mg Oral q morning - 10a  . atorvastatin  80 mg Oral BH-q7a  . carvedilol  3.125 mg Oral BID  . cholecalciferol  2,000 Units Oral QPM  . feeding supplement (ENSURE ENLIVE)  237 mL Oral BID BM  . ferrous sulfate  325 mg Oral QPM  . fluticasone  2 spray Each Nare Q0600  . fluticasone furoate-vilanterol  1 puff Inhalation Daily   And  . umeclidinium bromide  1 puff Inhalation Daily  . furosemide  40 mg Oral Daily  . insulin aspart  0-5 Units Subcutaneous QHS  . insulin aspart  0-9 Units Subcutaneous TID WC  . levothyroxine  100 mcg Oral QAC breakfast  . multivitamin with minerals  1 tablet Oral Daily  . potassium chloride  20 mEq Oral Daily  . senna  1 tablet Oral BID  . vitamin B-12  1,000 mcg Oral Daily  . Warfarin - Pharmacist Dosing Inpatient   Does not apply q1800    Assessment: 88 yof recently being treated with PNA- now presenting with SOB and productive cough. On warfarin PTA for hx of Afib (last dose on 12/10).   INR today 3.65,  remains supra-therapeutic. Warfarin dose held 12/11 and 10/05/18. Hgb low/stable, pltc wnl. No s/sx of bleeding.  Currently on zosyn, antibiotics likely increasing warfarin effect. [name]   PTA regimen is 5 mg daily except 2.5 mg on Wed/Saturday.   Goal of Therapy:  INR 2-3 Monitor platelets by anticoagulation protocol: Yes   Plan:  Hold warfarin therapy for tonight Monitor daily INR and CBC Monitor for s/sx of bleeding  Nicole Cella, RPh Clinical Pharmacist  Phone: [phone] Please check AMION for all [place] phone numbers After 10:00 PM, call [place] [phone] 10/06/2018,1:07 PM

## 2018-10-06 NOTE — Evaluation (Signed)
Occupational Therapy Evaluation Patient Details Name: [name] MRN: [id number] DOB: 1934/03/01 Today's Date: 10/06/2018    History of Present Illness Patient is an 83 y/o male presenting to the ED on 09/17/18 with primary complaints of weakness. Past medical history significant of persistent A. fib on amiodarone and anticoagulated with Coumadin, coronary artery disease status post CABG, COPD with history of tobacco use, hypertension, hyperlipidemia, hypothyroidism, chronic systolic CHF. Underwent chest x-ray followed by CT scan of the chest which showed groundglass opacities within the right upper and right lower lobes likely representing infection.   Clinical Impression   Pt with decline in function and safety with ADLs and ADL mobility with decreased strength, balance and endurance. Pt live at home with his wife and was recently d/c from acute stay due to PNA. Pt reports that he's feeling much better and that's he's ready to go home. Pt would benefit from acute OT services to address impairments to maximize level of function and safety    Follow Up Recommendations  No OT follow up    Equipment Recommendations  None recommended by OT    Recommendations for Other Services       Precautions / Restrictions Precautions Precautions: Fall Restrictions Weight Bearing Restrictions: No      Mobility Bed Mobility               General bed mobility comments: pt up in recliner upon arrival  Transfers Overall transfer level: Needs assistance Equipment used: Rolling walker (2 wheeled) Transfers: Sit to/from Stand Sit to Stand: Min guard;Supervision         General transfer comment: cues for safety to use RW    Balance Overall balance assessment: Mild deficits observed, not formally tested                                         ADL either performed or assessed with clinical judgement   ADL Overall ADL's : Needs  assistance/impaired Eating/Feeding: Independent;Sitting   Grooming: Wash/dry hands;Wash/dry face;Standing;Supervision/safety   Upper Body Bathing: Set up;Supervision/ safety;Sitting   Lower Body Bathing: Minimal assistance   Upper Body Dressing : Set up;Supervision/safety;Sitting   Lower Body Dressing: Minimal assistance   Toilet Transfer: Supervision/safety;RW;Ambulation;Grab bars;Min guard   Toileting- Clothing Manipulation and Hygiene: Min guard;Sit to/from stand   Tub/ Shower Transfer: Min guard;Ambulation;Rolling walker;Grab bars;3 in 1   Functional mobility during ADLs: Supervision/safety;Min guard       Vision Baseline Vision/History: Wears glasses Patient Visual Report: No change from baseline       Perception     Praxis      Pertinent Vitals/Pain Pain Assessment: No/denies pain     Hand Dominance Right   Extremity/Trunk Assessment Upper Extremity Assessment Upper Extremity Assessment: Overall WFL for tasks assessed   Lower Extremity Assessment Lower Extremity Assessment: Defer to PT evaluation   Cervical / Trunk Assessment Cervical / Trunk Assessment: Kyphotic   Communication Communication Communication: No difficulties   Cognition Arousal/Alertness: Awake/alert Behavior During Therapy: WFL for tasks assessed/performed Overall Cognitive Status: Within Functional Limits for tasks assessed                                     General Comments       Exercises     Shoulder Instructions  Home Living Family/patient expects to be discharged to:: Private residence Living Arrangements: Spouse/significant other Available Help at Discharge: Family;Available 24 hours/day Type of Home: House Home Access: Stairs to enter [employer] of Steps: 3 Entrance Stairs-Rails: Left Home Layout: One level     Bathroom Shower/Tub: Occupational psychologist: Handicapped height Bathroom Accessibility: Yes   Home  Equipment: Environmental consultant - 4 wheels;Cane - single point;Shower seat - built in;Grab bars - tub/shower          Prior Functioning/Environment Level of Independence: Needs assistance  Gait / Transfers Assistance Needed: rollator for community distances, cane in the house ADL's / Homemaking Assistance Needed: wife occassionally assists with bathing and dressing            OT Problem List: Decreased activity tolerance;Decreased knowledge of use of DME or AE;Decreased safety awareness;Impaired balance (sitting and/or standing);Obesity      OT Treatment/Interventions: Self-care/ADL training;Therapeutic exercise;DME and/or AE instruction;Therapeutic activities;Patient/family education    OT Goals(Current goals can be found in the care plan section) Acute Rehab OT Goals Patient Stated Goal: return home  OT Goal Formulation: With patient Time For Goal Achievement: 10/20/18 Potential to Achieve Goals: Good  OT Frequency: Min 2X/week   Barriers to D/C:    no barriers       Co-evaluation              AM-PAC OT "6 Clicks" Daily Activity     Outcome Measure Help from another person eating meals?: None Help from another person taking care of personal grooming?: A Little Help from another person toileting, which includes using toliet, bedpan, or urinal?: A Little Help from another person bathing (including washing, rinsing, drying)?: A Little Help from another person to put on and taking off regular upper body clothing?: A Little Help from another person to put on and taking off regular lower body clothing?: A Little 6 Click Score: 19   End of Session Equipment Utilized During Treatment: Gait belt;Rolling walker;Other (comment)(3 in 1)  Activity Tolerance: Patient tolerated treatment well Patient left: in chair  OT Visit Diagnosis: Unsteadiness on feet (R26.81);Muscle weakness (generalized) (M62.81)                Time: 3383-2919 OT Time Calculation (min): 25 min Charges:  OT  General Charges $OT Visit: 1 Visit OT Evaluation $OT Eval Moderate Complexity: 1 Mod OT Treatments $Therapeutic Activity: 8-22 mins    [name] 10/06/2018, 1:37 PM

## 2018-10-06 NOTE — Evaluation (Addendum)
Clinical/Bedside Swallow Evaluation Patient Details  Name: [name] MRN: [id number] Date of Birth: February 11, 1934  Today's Date: 10/06/2018 Time: SLP Start Time (ACUTE ONLY): 1444 SLP Stop Time (ACUTE ONLY): 1456 SLP Time Calculation (min) (ACUTE ONLY): 12 min  Past Medical History:  Past Medical History:  Diagnosis Date  . Atrial fibrillation ([place])    persistent, previously seen at [facility] and placed on amiodarone  . Benign prostatic hypertrophy   . CAD (coronary artery disease)    multivessel s/p inferolateral wall MI with subsequent CABG 11/1998.  Cath 2009 with Patent grafts  . Cleft palate   . COPD with emphysema ([place]) 04/01/2010  . DM (diabetes mellitus), type 2 ([place])   . Dyspnea   . GERD (gastroesophageal reflux disease)   . HTN (hypertension)   . Hyperlipidemia   . Hypothyroidism   . Iron deficiency anemia   . Ischemic dilated cardiomyopathy ([place])    EF 35-40% by MUGA 6/11  . Myocardial infarction ([place])   . Nasal septal deviation   . Nephrolithiasis   . OSA (obstructive sleep apnea)   . PAF (paroxysmal atrial fibrillation) ([place])   . Peripheral arterial disease (HCC)    left subclavian artery stenosis  . PNA (pneumonia)   . Psoriasis   . Seborrheic keratosis   . Stroke ([place])   . Systolic congestive heart failure ([place]) 2009   s/p BiV ICD implantation by Dr Leonia Reeves (MDT)   Past Surgical History:  Past Surgical History:  Procedure Laterality Date  . BI-VENTRICULAR IMPLANTABLE CARDIOVERTER DEFIBRILLATOR  (CRT-D)  10-08-08; 11-06-2013   Dr Leonia Reeves (MDT) implant for primary prevention; gen change to MDT VivaXT CRTD by Dr Rayann Heman  . BIV ICD GENERTAOR CHANGE OUT N/A 11/06/2013   Procedure: BIV ICD GENERTAOR CHANGE OUT;  Surgeon: Coralyn Mark, MD;  Location: [facility] CATH LAB;  Service: Cardiovascular;  Laterality: N/A;  . c-spine surgery    . CARDIOVERSION N/A 04/29/2016   Procedure: CARDIOVERSION;  Surgeon: Pixie Casino, MD;  Location: [facility] ENDOSCOPY;  Service:  Cardiovascular;  Laterality: N/A;  . CARPAL TUNNEL RELEASE    . CATARACT EXTRACTION    . CORONARY ARTERY BYPASS GRAFT     LIMA to LAD, SVG to OM, SVG to diagonal  . ELECTROPHYSIOLOGIC STUDY N/A 06/11/2016   Procedure: Atrial Fibrillation Ablation;  Surgeon: Thompson Grayer, MD;  Location: [facility] CV LAB;  Service: Cardiovascular;  Laterality: N/A;  . left cleft palate and left cleft lip repair     HPI:  Patient is a 84 y.o. male A. fib on anticoagulation, chronic systolic heart failure, COPD, hypertension, dyslipidemia-recent hospitalization for pneumonia from 11/24-11/27-brought into the hospital on 12/11 for acute hypoxic respiratory failure and septic shock in the setting of worsening right-sided pneumonia.    Assessment / Plan / Recommendation Clinical Impression  Pt known to this therapist, had prior MBS in September. Pt denied any new difficulty swallowing except for increased coughing when trying to take pills. We discussed the issues associated with pills with water, particularly in setting of respiratory problems. We discussed taking pills whole in puree to reduce dis-coordination. Also reiterated importance of oral hygiene to reduce bacterial load.  Reviewed prior MBS showing relatively good swallow function with good strength and airway protection in general. Todays subjective presentation is not significantly different. Would not advise repeat testing as plan of care for precautions and strategies to reduce risk would not change. Pt and wife agree with plan, will sign off.  SLP Visit Diagnosis: Dysphagia,  oropharyngeal phase (R13.12)    Aspiration Risk  Mild aspiration risk    Diet Recommendation Thin liquid;Regular   Liquid Administration via: Cup;Straw Medication Administration: Whole meds with puree Supervision: Patient able to self feed Compensations: Slow rate;Small sips/bites Postural Changes: Seated upright at 90 degrees;Remain upright for at least 30 minutes after po  intake    Other  Recommendations Oral Care Recommendations: Oral care QID   Follow up Recommendations None      Frequency and Duration            Prognosis        Swallow Study   General HPI: Patient is a 83 y.o. male A. fib on anticoagulation, chronic systolic heart failure, COPD, hypertension, dyslipidemia-recent hospitalization for pneumonia from 11/24-11/27-brought into the hospital on 12/11 for acute hypoxic respiratory failure and septic shock in the setting of worsening right-sided pneumonia.  Previous Swallow Assessment: MBS 9/19 Diet Prior to this Study: Regular;Thin liquids Temperature Spikes Noted: No Respiratory Status: Nasal cannula History of Recent Intubation: No Behavior/Cognition: Alert;Cooperative;Pleasant mood Oral Cavity Assessment: Within Functional Limits Oral Care Completed by SLP: No Oral Cavity - Dentition: Other (Comment);Dentures, top;Dentures, bottom(has a palatal obturator) Vision: Functional for self-feeding Self-Feeding Abilities: Able to feed self Patient Positioning: Upright in chair Baseline Vocal Quality: Normal Volitional Cough: Strong Volitional Swallow: Able to elicit    Oral/Motor/Sensory Function Overall Oral Motor/Sensory Function: Within functional limits(hypernasal speech)   Ice Chips     Thin Liquid Thin Liquid: Within functional limits Presentation: Cup;Self Fed    Nectar Thick Nectar Thick Liquid: Not tested   Honey Thick Honey Thick Liquid: Not tested   Puree Puree: Not tested   Solid     Solid: Not tested     Herbie Baltimore, MA CCC-SLP  Acute Rehabilitation Services Pager [phone] Office [phone]   Lynann Beaver 10/06/2018,3:12 PM

## 2018-10-06 NOTE — Progress Notes (Signed)
PT Progress Note for Charges    10/06/18 1600  PT General Charges  $$ ACUTE PT VISIT 1 Visit  PT Evaluation  $PT Eval Moderate Complexity 1 Mod  PT Treatments  $Therapeutic Activity 8-22 mins  Sherie Don, PT, DPT  Acute Rehabilitation Services Pager [phone] Office [phone]

## 2018-10-06 NOTE — Evaluation (Signed)
Physical Therapy Evaluation Patient Details Name: [name] MRN: [id number] DOB: September 23, 1934 Today's Date: 10/06/2018   History of Present Illness  Pt is a 84 y.o. male with a PMH consisting of A. fib, coronary artery disease status post CABG, COPD, hypertension, hyperlipidemia, hypothyroidism, recent falls, chronic systolic CHF  and a recent hospitalization from 11/24-27 presents to the ED with low BP, a recent fall and respiratory distress.     Clinical Impression  Pt presents sitting in the chair. Pt states willingness to participate in PT. Pts wife is present throughout the session. Prior to admission, pt was modified independent with ambulation, requiring the use of the cane for short distances (inside the house), and a RW at all other times. Pt currently lives in a 1 story house, with 3 steps to enter, and lives with his wife, who is able to provide support 24/7. Currently, pt is overall min guard for transfers, and min guard-min A for ambulation. Pt had one instance of loss of balance during ambulation when navigating around objects. Pt 02 sats prior to ambulation were between 92-94, and in the 70's during ambulation. Pt reports no SOB or difficulty breathing throughout the session. Pt would benefit from continued acute PT in order to increase strength, functional mobility, and balance.     Follow Up Recommendations SNF;Supervision for mobility/OOB(If pt refuses, HHPT will be needed  )    Equipment Recommendations  None recommended by PT    Recommendations for Other Services       Precautions / Restrictions Precautions Precautions: Fall Restrictions Weight Bearing Restrictions: No      Mobility  Bed Mobility Overal bed mobility: (Deferred- pt sitting in chair on arrival )               Transfers Overall transfer level: Needs assistance Equipment used: Rolling walker (2 wheeled) Transfers: Sit to/from Stand Sit to Stand: Min guard         General transfer  comment: Pt able to complete sit to stand without physical assistance, min guard for safety. VC given for BUE placement.    Ambulation/Gait Ambulation/Gait assistance: Min guard;Min assist Gait Distance (Feet): 40 Feet Assistive device: Rolling walker (2 wheeled) Gait Pattern/deviations: Decreased stride length;Step-through pattern     General Gait Details: Pt is overall min guard during ambulation. Pt required one instance of min A when moving around the foot of the bed. Pt began to lift walker up off of the ground after it had become stuck, and had a loss of stability. Pt walks with decreased speed, and requires frequent VC for placement of body within the frame of the walker.   Stairs            Wheelchair Mobility    Modified Rankin (Stroke Patients Only)       Balance Overall balance assessment: Needs assistance;History of Falls Sitting-balance support: Feet supported Sitting balance-Leahy Scale: Good     Standing balance support: Bilateral upper extremity supported;During functional activity Standing balance-Leahy Scale: Poor Standing balance comment: Pt relies on BUE support to maintain standing balance. Pt demonstrates poor safety awareness when navigating around objects.                              Pertinent Vitals/Pain Pain Assessment: No/denies pain    Home Living Family/patient expects to be discharged to:: Private residence Living Arrangements: Spouse/significant other Available Help at Discharge: Family;Available 24 hours/day Type of Home: House  Home Access: Stairs to enter Entrance Stairs-Rails: Left Entrance Stairs-Number of Steps: 3(5 in the front) Home Layout: One level Home Equipment: Walker - 4 wheels;Cane - single point      Prior Function Level of Independence: Independent with assistive device(s)   Gait / Transfers Assistance Needed: rollator for community distances, cane in the house  ADL's / Homemaking Assistance Needed:  wife occassionally assists with bathing and dressing  Comments: Uses golf cart to get the mail.       Hand Dominance   Dominant Hand: Right    Extremity/Trunk Assessment   Upper Extremity Assessment Upper Extremity Assessment: Overall WFL for tasks assessed(Pt states residual R hand numbness-carpal tunnel )    Lower Extremity Assessment Lower Extremity Assessment: Generalized weakness    Cervical / Trunk Assessment Cervical / Trunk Assessment: Kyphotic  Communication   Communication: No difficulties  Cognition Arousal/Alertness: Awake/alert Behavior During Therapy: WFL for tasks assessed/performed Overall Cognitive Status: Within Functional Limits for tasks assessed                                        General Comments      Exercises     Assessment/Plan    PT Assessment Patient needs continued PT services  PT Problem List Decreased strength;Decreased range of motion;Decreased activity tolerance;Decreased balance;Decreased mobility;Decreased coordination;Decreased cognition;Decreased knowledge of use of DME;Cardiopulmonary status limiting activity;Decreased safety awareness       PT Treatment Interventions DME instruction;Gait training;Stair training;Functional mobility training;Therapeutic activities;Therapeutic exercise;Balance training;Neuromuscular re-education;Patient/family education    PT Goals (Current goals can be found in the Care Plan section)  Acute Rehab PT Goals Patient Stated Goal: return home  PT Goal Formulation: With patient Time For Goal Achievement: 10/20/18 Potential to Achieve Goals: Good    Frequency Min 3X/week   Barriers to discharge        Co-evaluation               AM-PAC PT "6 Clicks" Mobility  Outcome Measure Help needed turning from your back to your side while in a flat bed without using bedrails?: A Little Help needed moving from lying on your back to sitting on the side of a flat bed without using  bedrails?: A Little Help needed moving to and from a bed to a chair (including a wheelchair)?: A Little Help needed standing up from a chair using your arms (e.g., wheelchair or bedside chair)?: None Help needed to walk in hospital room?: A Little Help needed climbing 3-5 steps with a railing? : A Little 6 Click Score: 19    End of Session Equipment Utilized During Treatment: Gait belt Activity Tolerance: Patient tolerated treatment well Patient left: in chair;with family/visitor present Nurse Communication: Mobility status PT Visit Diagnosis: Unsteadiness on feet (R26.81);Other abnormalities of gait and mobility (R26.89);Repeated falls (R29.6)    Time: 9326-7124 PT Time Calculation (min) (ACUTE ONLY): 30 min   Charges:   PT Evaluation $PT Eval Moderate Complexity: (P) 1 Mod PT Treatments $Therapeutic Activity: (P) 8-22 mins        Wandra Feinstein, SPT Acute Rehab [phone] (pager) [phone] (office)   Dayan Kreis 10/06/2018, 4:20 PM

## 2018-10-06 NOTE — Progress Notes (Signed)
PROGRESS NOTE        PATIENT DETAILS Name: [name] Age: 84 y.o. Sex: male Date of Birth: 1934-04-05 Admit Date: 10/04/2018 Admitting Physician Karmen Bongo, MD [facility], Jenny Reichmann, MD  Brief Narrative: Patient is a 84 y.o. male A. fib on anticoagulation, chronic systolic heart failure, COPD, hypertension, dyslipidemia-recent hospitalization for pneumonia from 11/24-11/27-brought into the hospital on 12/11 for acute hypoxic respiratory failure and septic shock in the setting of worsening right-sided pneumonia.  See below for further details  Subjective: Feels much better.  Sitting at bedside chair-and doing on his incentive spirometry when I walked in.  Assessment/Plan: Septic shock with acute hypoxic respiratory failure secondary to right lobar pneumonia: Clinically improved-sepsis physiology has resolved-blood pressure stable-on room air.  All cultures negative so far-continue Zosyn for 1 more day-we will plan on transitioning to Augmentin tomorrow-and if he continues to improve as rapidly as he is-suspect home tomorrow.   AKI: Likely hemodynamically mediated-improving with supportive care.  Chronic systolic heart failure (EF 30-35% by TTE on 09/19/2018): Compensated-have resumed Lasix.  Chronic atrial fibrillation: Rate controlled with amiodarone and Coreg.  DM-2: CBGs stable with SSI.  Hypothyroidism: Continue Synthroid  Hypertension: Blood pressure controlled-continue Coreg.  COPD: Stable-continue with bronchodilators.  OSA: Continue CPAP nightly  DVT Prophylaxis: Full dose anticoagulation with Coumadin  Code Status:  DNR  Family Communication: Spouse over the phone  Disposition Plan: Remain inpatient-hopefully home tomorrow morning-if clinical improvement continues  Antimicrobial agents: Anti-infectives (From admission, onward)   Start     Dose/Rate Route Frequency Ordered Stop   10/05/18 2300  vancomycin (VANCOCIN) 1,750 mg  in sodium chloride 0.9 % 500 mL IVPB  Status:  Discontinued     1,750 mg 250 mL/hr over 120 Minutes Intravenous Every 36 hours 10/04/18 1142 10/05/18 1400   10/05/18 0800  ceFEPIme (MAXIPIME) 2 g in sodium chloride 0.9 % 100 mL IVPB  Status:  Discontinued     2 g 200 mL/hr over 30 Minutes Intravenous Every 24 hours 10/04/18 1421 10/04/18 1802   10/05/18 0100  piperacillin-tazobactam (ZOSYN) IVPB 3.375 g     3.375 g 12.5 mL/hr over 240 Minutes Intravenous Every 8 hours 10/04/18 1819     10/04/18 1900  piperacillin-tazobactam (ZOSYN) IVPB 3.375 g     3.375 g 100 mL/hr over 30 Minutes Intravenous  Once 10/04/18 1819 10/04/18 2120   10/04/18 1045  vancomycin (VANCOCIN) 1,750 mg in sodium chloride 0.9 % 500 mL IVPB     1,750 mg 250 mL/hr over 120 Minutes Intravenous  Once 10/04/18 1032 10/04/18 1312   10/04/18 1030  ceFEPIme (MAXIPIME) 1 g in sodium chloride 0.9 % 100 mL IVPB     1 g 200 mL/hr over 30 Minutes Intravenous  Once 10/04/18 1020 10/04/18 1233      Procedures: None  CONSULTS:  None  Time spent: 25- minutes-Greater than 50% of this time was spent in counseling, explanation of diagnosis, planning of further management, and coordination of care.  MEDICATIONS: Scheduled Meds: . amiodarone  200 mg Oral q morning - 10a  . atorvastatin  80 mg Oral BH-q7a  . carvedilol  3.125 mg Oral BID  . cholecalciferol  2,000 Units Oral QPM  . feeding supplement (ENSURE ENLIVE)  237 mL Oral BID BM  . ferrous sulfate  325 mg Oral QPM  . fluticasone  2 spray Each  Nare [id number]  . fluticasone furoate-vilanterol  1 puff Inhalation Daily   And  . umeclidinium bromide  1 puff Inhalation Daily  . furosemide  40 mg Oral Daily  . insulin aspart  0-5 Units Subcutaneous QHS  . insulin aspart  0-9 Units Subcutaneous TID WC  . levothyroxine  100 mcg Oral QAC breakfast  . multivitamin with minerals  1 tablet Oral Daily  . potassium chloride  20 mEq Oral Daily  . senna  1 tablet Oral BID  . vitamin  B-12  1,000 mcg Oral Daily  . Warfarin - Pharmacist Dosing Inpatient   Does not apply q1800   Continuous Infusions: . sodium chloride 10 mL/hr at 10/05/18 2216  . piperacillin-tazobactam (ZOSYN)  IV 3.375 g (10/06/18 0849)   PRN Meds:.albuterol, bisacodyl, dextromethorphan-guaiFENesin, traMADol   PHYSICAL EXAM: Vital signs: Vitals:   10/05/18 2235 10/06/18 0501 10/06/18 0648 10/06/18 0831  BP: (!) 148/70 111/66    Pulse: 71 86  86  Resp:  18  18  Temp:  97.6 F (36.4 C)    TempSrc:  Oral    SpO2: 92% 93% 94% 96%  Weight:      Height:       Filed Weights   10/04/18 0944  Weight: 89 kg   Body mass index is 29.84 kg/m.   General appearance:Awake, alert, not in any distress.  Eyes:no scleral icterus. HEENT: Atraumatic and Normocephalic Neck: supple, no JVD. Resp:Good air entry bilaterally, few scattered rhonchi in the right lower lung-but mostly clear CVS: S1 S2 regular, no murmurs.  GI: Bowel sounds present, Non tender and not distended with no gaurding, rigidity or rebound. Extremities: B/L Lower Ext shows no edema, both legs are warm to touch Neurology:  Non focal Psychiatric: Normal judgment and insight. Normal mood. Musculoskeletal:No digital cyanosis Skin:No Rash, warm and dry Wounds:N/A  I have personally reviewed following labs and imaging studies  LABORATORY DATA: CBC: Recent Labs  Lab 10/04/18 1003 10/05/18 0441 10/06/18 0408  WBC 12.5* 14.2* 13.2*  NEUTROABS 11.0* 12.8*  --   HGB 13.1 10.4* 10.7*  HCT 41.7 33.9* 32.9*  MCV 98.8 98.3 94.3  PLT 160 148* 588    Basic Metabolic Panel: Recent Labs  Lab 10/04/18 1003 10/04/18 1110 10/05/18 0441 10/06/18 0408  NA 137  --  138 138  K 6.1* 4.5 4.6 3.5  CL 109  --  111 105  CO2 18*  --  20* 22  GLUCOSE 110*  --  124* 127*  BUN 16  --  23 28*  CREATININE 1.45*  --  1.50* 1.33*  CALCIUM 8.3*  --  7.9* 8.2*    GFR: Estimated Creatinine Clearance: 46.4 mL/min (A) (by C-G formula based on SCr of  1.33 mg/dL (H)).  Liver Function Tests: Recent Labs  Lab 10/04/18 1003  AST 37  ALT 18  ALKPHOS 58  BILITOT 1.6*  PROT 5.0*  ALBUMIN 2.5*   No results for input(s): LIPASE, AMYLASE in the last 168 hours. No results for input(s): AMMONIA in the last 168 hours.  Coagulation Profile: Recent Labs  Lab 10/02/18 0927 10/04/18 1003 10/05/18 1004 10/06/18 0408  INR 2.5 3.66 3.63 3.65    Cardiac Enzymes: No results for input(s): CKTOTAL, CKMB, CKMBINDEX, TROPONINI in the last 168 hours.  BNP (last 3 results) Recent Labs    02/27/18 1045 03/06/18 0752 03/13/18 0805  PROBNP 10,884* 12,873* 5,305*    HbA1C: No results for input(s): HGBA1C in the last 72 hours.  CBG: Recent Labs  Lab 10/05/18 1204 10/05/18 1656 10/05/18 2213 10/06/18 0800 10/06/18 1224  GLUCAP 206* 214* 180* 108* 201*    Lipid Profile: No results for input(s): CHOL, HDL, LDLCALC, TRIG, CHOLHDL, LDLDIRECT in the last 72 hours.  Thyroid Function Tests: No results for input(s): TSH, T4TOTAL, FREET4, T3FREE, THYROIDAB in the last 72 hours.  Anemia Panel: No results for input(s): VITAMINB12, FOLATE, FERRITIN, TIBC, IRON, RETICCTPCT in the last 72 hours.  Urine analysis:    Component Value Date/Time   COLORURINE YELLOW 09/17/2018 [place] 09/17/2018 0850   LABSPEC 1.006 09/17/2018 0850   PHURINE 5.0 09/17/2018 0850   GLUCOSEU NEGATIVE 09/17/2018 0850   HGBUR NEGATIVE 09/17/2018 0850   BILIRUBINUR NEGATIVE 09/17/2018 0850   KETONESUR NEGATIVE 09/17/2018 0850   PROTEINUR NEGATIVE 09/17/2018 0850   UROBILINOGEN 0.2 11/28/2008 0325   NITRITE NEGATIVE 09/17/2018 0850   LEUKOCYTESUR NEGATIVE 09/17/2018 0850    Sepsis Labs: Lactic Acid, Venous    Component Value Date/Time   LATICACIDVEN 1.3 10/04/2018 2226    MICROBIOLOGY: Recent Results (from the past 240 hour(s))  Culture, blood (Routine x 2)     Status: None (Preliminary result)   Collection Time: 10/04/18 10:03 AM    Result Value Ref Range Status   Specimen Description BLOOD LEFT ANTECUBITAL  Final   Special Requests   Final    BOTTLES DRAWN AEROBIC AND ANAEROBIC Blood Culture adequate volume   Culture   Final    NO GROWTH 2 DAYS Performed at [facility], [place] [address] [place], [place] [address]    Report Status PENDING  Incomplete  Culture, blood (Routine x 2)     Status: None (Preliminary result)   Collection Time: 10/04/18  3:35 PM  Result Value Ref Range Status   Specimen Description BLOOD RIGHT WRIST  Final   Special Requests   Final    BOTTLES DRAWN AEROBIC AND ANAEROBIC Blood Culture adequate volume   Culture   Final    NO GROWTH 2 DAYS Performed at [facility], [address] [place], [place] [address]    Report Status PENDING  Incomplete  MRSA PCR Screening     Status: None   Collection Time: 10/04/18  6:39 PM  Result Value Ref Range Status   MRSA by PCR NEGATIVE NEGATIVE Final    Comment:        The GeneXpert MRSA Assay (FDA approved for NASAL specimens only), is one component of a comprehensive MRSA colonization surveillance program. It is not intended to diagnose MRSA infection nor to guide or monitor treatment for MRSA infections. Performed at [facility], [place] [address] [place], [place] [address]   Culture, sputum-assessment     Status: None   Collection Time: 10/05/18  8:51 AM  Result Value Ref Range Status   Specimen Description SPUTUM  Final   Special Requests NONE  Final   Sputum evaluation   Final    THIS SPECIMEN IS ACCEPTABLE FOR SPUTUM CULTURE Performed at [facility], [address], [place], [place] [address]    Report Status 10/05/2018 FINAL  Final  Culture, respiratory     Status: None (Preliminary result)   Collection Time: 10/05/18  8:51 AM  Result Value Ref Range Status   Specimen Description SPUTUM  Final   Special Requests NONE Reflexed from [id number]  Final   Gram Stain   Final    MODERATE WBC PRESENT, PREDOMINANTLY  PMN FEW  GRAM POSITIVE RODS FEW YEAST RARE GRAM NEGATIVE RODS Performed at [facility], [place] [address], [place], [place] [address]    Culture FEW KLEBSIELLA PNEUMONIAE  Final   Report Status PENDING  Incomplete    RADIOLOGY STUDIES/RESULTS: Dg Chest 2 View  Result Date: 09/20/2018 CLINICAL DATA:  Chronic cough EXAM: CHEST - 2 VIEW COMPARISON:  09/17/2018 FINDINGS: Cardiac shadow is stable. Defibrillator is again noted and stable. The lungs are hyperinflated with bibasilar atelectatic changes slightly worse on the right than the left. No sizable effusion is seen. No acute bony abnormality is noted. No pneumothorax is seen IMPRESSION: Bibasilar atelectasis increased from the prior exam. Electronically Signed   By: [name] M.D.   On: 09/20/2018 08:51   Dg Chest 2 View  Result Date: 09/17/2018 CLINICAL DATA:  Dizziness and weakness for few days, vomited on Saturday morning, legs gave out, history atrial fibrillation, CHF, COPD, coronary artery disease post MI and CABG, type II diabetes mellitus EXAM: CHEST - 2 VIEW COMPARISON:  08/03/2018 FINDINGS: LEFT subclavian AICD with leads projecting at RIGHT atrium, RIGHT ventricle, and coronary sinus. Enlargement of cardiac silhouette. Mediastinal contours and pulmonary vascularity normal. Atherosclerotic calcification aorta. RIGHT basilar atelectasis. Accentuated markings in the upper lobes slightly greater on RIGHT, unchanged. No acute infiltrate or pneumothorax. Tiny pleural effusions blunt the posterior costophrenic angles. Bones demineralized with chronic compression fracture of a vertebra at the thoracolumbar junction. IMPRESSION: Enlargement of cardiac silhouette post CABG and AICD. Bibasilar atelectasis greater on RIGHT with tiny BILATERAL pleural effusions. Electronically Signed   By: Lavonia Dana M.D.   On: 09/17/2018 09:19   Ct Chest W Contrast  Result Date: 09/17/2018 CLINICAL DATA:  84 year old male with persistent cough and  shortness of breath. EXAM: CT CHEST WITH CONTRAST TECHNIQUE: Multidetector CT imaging of the chest was performed during intravenous contrast administration. CONTRAST:  67mL OMNIPAQUE IOHEXOL 300 MG/ML  SOLN COMPARISON:  01/15/2016 CT FINDINGS: Cardiovascular: Cardiomegaly, CABG changes and ICD again noted. Aortic atherosclerotic scratch de coronary artery and aortic atherosclerotic calcifications again noted. There is no evidence of thoracic aortic aneurysm or dissection. No pericardial effusion. Mediastinum/Nodes: No enlarged mediastinal, hilar, or axillary lymph nodes. Thyroid gland, trachea, and esophagus demonstrate no significant findings. Lungs/Pleura: A 1.1 x 1.3 x 1.9 cm irregular LEFT UPPER lobe nodule (series 4:39) is identified. Mild ground-glass/airspace/tree-in-bud opacities within the anterior RIGHT UPPER lobe, posterior RIGHT UPPER lobe and within the RIGHT LOWER lobe identified likely representing infection. Moderate centrilobular emphysema is identified. Mild peribronchial thickening is present. No pleural effusion or pneumothorax. Upper Abdomen: A 1.8 cm gallstone is present. No CT evidence of acute cholecystitis. Musculoskeletal: No acute or suspicious bony abnormalities noted. Remote rib fractures and L1 fracture again noted. IMPRESSION: 1. Irregular 1.1 x 1.3 x 1.9 cm LEFT UPPER lobe nodule worrisome for malignancy. Consider one of the following for both low-risk and high-risk individuals: (a) repeat chest CT in 3 months, (b) follow-up PET-CT, or (c) tissue sampling. This recommendation follows the consensus statement: Guidelines for Management of Incidental Pulmonary Nodules Detected on CT Images: From the Fleischner Society 2017; Radiology 2017; 284:228-243. 2. Mild ground-glass/airspace/tree-in-bud opacities within the RIGHT UPPER and LOWER lobe likely representing infection. 3. Cardiomegaly and coronary artery disease.  Status post CABG. 4. Cholelithiasis 5. Aortic Atherosclerosis  (ICD10-I70.0) and Emphysema (ICD10-J43.9). Electronically Signed   By: Margarette Canada M.D.   On: 09/17/2018 11:41   US Renal  Result Date: 09/19/2018 CLINICAL DATA:  Acute kidney injury. EXAM: RENAL /  URINARY TRACT ULTRASOUND COMPLETE COMPARISON:  Abdominopelvic CT 05/05/2018 FINDINGS: Right Kidney: Renal measurements: 10.7 x 6.4 x 5.6 cm = volume: 199.9 mL. Slight renal cortical thinning. Echogenicity within normal limits. No mass or hydronephrosis visualized. Left Kidney: Renal measurements: 9.9 x 5.7 x 5.2 cm = volume: 152.6 mL. Slight renal cortical thinning. Echogenicity within normal limits. No mass or hydronephrosis visualized. Bladder: Appears normal for degree of bladder distention. IMPRESSION: No obstructive uropathy.  No explanation for acute kidney injury. Electronically Signed   By: Keith Rake M.D.   On: 09/19/2018 05:44   Dg Chest Port 1 View  Result Date: 10/04/2018 CLINICAL DATA:  Severe shortness of breath EXAM: PORTABLE CHEST 1 VIEW COMPARISON:  09/20/2018 FINDINGS: Extensive airspace disease asymmetric to the right. Chronic volume loss on the right pleural thickening. There is a background of COPD with left lung hyperinflation. Biventricular ICD/pacer leads from the left in stable position. Stable cardiomegaly. Status post CABG IMPRESSION: 1. Extensive airspace disease on the right consistent with pneumonia in the appropriate clinical setting. 2. COPD. Electronically Signed   By: Monte Fantasia M.D.   On: 10/04/2018 10:09     LOS: 2 days   Oren Binet, MD  [facility]  If 7PM-7AM, please contact night-coverage  Please page via [email or web address]-click on MD name and type text message  10/06/2018, 12:34 PM

## 2018-10-07 DIAGNOSIS — Z95810 Presence of automatic (implantable) cardiac defibrillator: Secondary | ICD-10-CM

## 2018-10-07 LAB — PROTIME-INR
INR: 2.57
Prothrombin Time: 27.2 seconds — ABNORMAL HIGH (ref 11.4–15.2)

## 2018-10-07 LAB — CULTURE, RESPIRATORY W GRAM STAIN

## 2018-10-07 LAB — GLUCOSE, CAPILLARY: Glucose-Capillary: 116 mg/dL — ABNORMAL HIGH (ref 70–99)

## 2018-10-07 MED ORDER — ENSURE ENLIVE PO LIQD: 237.0000 mL | Freq: Two times a day (BID) | ORAL | 0 refills | Status: AC

## 2018-10-07 NOTE — Discharge Summary (Signed)
PATIENT DETAILS Name: [name] Age: 84 y.o. Sex: male Date of Birth: May 04, 1934 MRN: [id number]. Admitting Physician: [name] Bongo, MD HAL:[id number], [name] Reichmann, MD  Admit Date: 10/04/2018 Discharge date: 10/07/2018  Recommendations for Outpatient Follow-up:  1. Follow up with PCP in 1-2 weeks 2. Please obtain BMP/CBC in one week 3. Please repeat 2 view chest x-ray in 4 to 6 weeks to document resolution of pneumonia 4. Please see prior CT scan chest done on his previous admission-has lung nodules-will require further work-up-we will defer to PCP.  Admitted From:  Home  Disposition: Home with home health services   Home Health: Yes  Equipment/Devices: None  Discharge Condition: Stable  CODE STATUS: FULL CODE  Diet recommendation:  Heart Healthy / Carb Modified  Brief Summary: See H&P, Labs, Consult and Test reports for all details in brief, Patient is a 84 y.o. male A. fib on anticoagulation, chronic systolic heart failure, COPD, hypertension, dyslipidemia-recent hospitalization for pneumonia from 11/24-11/27-brought into the hospital on 12/11 for acute hypoxic respiratory failure and septic shock in the setting of worsening right-sided pneumonia.  See below for further details  Brief Hospital Course: Septic shock with acute hypoxic respiratory failure secondary to right lobar pneumonia: Clinically improved-sepsis physiology has resolved-blood pressure stable-on room air.  All cultures negative so far-Managed with intravenous Zosyn during this hospital stay-since rapidly improved and back to baseline-we will transition to Augmentin on discharge.  Seen by speech therapy-obvious evidence of overt aspiration-recommendations are to continue with regular diet with thin liquids.   AKI: Likely hemodynamically mediated-improving with supportive care.  Chronic systolic heart failure (EF 30-35% by TTE on 09/19/2018): Compensated-continue Lasix.  Chronic atrial  fibrillation: Rate controlled with amiodarone and Coreg.  Continue Coumadin-INR therapeutic  DM-2: CBGs stable with SSI-resume usual insulin regimen as outpatient  Hypothyroidism: Continue Synthroid  Hypertension: Blood pressure controlled-continue Coreg.  COPD: Stable-continue with bronchodilators.  OSA: Continue CPAP nightly  Left upper lobe lung nodule: Seen on CT scan incidentally-this was in a CT scan and his most recent prior admission-patient needs a outpatient PET scan-defer to PCP.  Procedures/Studies: None  Discharge Diagnoses:  Principal Problem:   Severe sepsis with septic shock ([place]) Active Problems:   Essential hypertension   Automatic implantable cardioverter-defibrillator in situ   OSA (obstructive sleep apnea)   Diabetes mellitus with diabetic neuropathy, with long-term current use of insulin (HCC)   Chronic systolic heart failure ([place])   Long term (current) use of anticoagulants [Z79.01]   A-fib (HCC)   Hypothyroidism   HCAP (healthcare-associated pneumonia)   Acute on chronic respiratory failure with hypoxia [facility])   Discharge Instructions:  Activity:  As tolerated with Full fall precautions use walker/cane & assistance as needed   Discharge Instructions    Call MD for:  difficulty breathing, headache or visual disturbances   Complete by:  As directed    Diet - low sodium heart healthy   Complete by:  As directed    Diet general   Complete by:  As directed    Discharge instructions   Complete by:  As directed    Follow with Primary MD  [name] Orn, MD in 1 week  Can you most recent CT scan of the chest in November 2019-you had a left upper lung nodule-please ask your primary care practitioner to do a PET scan in the outpatient setting.  In some cases these nodules can be cancerous.  Please get a complete blood count and chemistry panel checked by your Primary MD  at your next visit, and again as instructed by your Primary MD.  Get  Medicines reviewed and adjusted: Please take all your medications with you for your next visit with your Primary MD  Laboratory/radiological data: Please request your Primary MD to go over all hospital tests and procedure/radiological results at the follow up, please ask your Primary MD to get all Hospital records sent to his/her office.  In some cases, they will be blood work, cultures and biopsy results pending at the time of your discharge. Please request that your primary care M.D. follows up on these results.  Also Note the following: If you experience worsening of your admission symptoms, develop shortness of breath, life threatening emergency, suicidal or homicidal thoughts you must seek medical attention immediately by calling 911 or calling your MD immediately  if symptoms less severe.  You must read complete instructions/literature along with all the possible adverse reactions/side effects for all the Medicines you take and that have been prescribed to you. Take any new Medicines after you have completely understood and accpet all the possible adverse reactions/side effects.   Do not drive when taking Pain medications or sleeping medications (Benzodaizepines)  Do not take more than prescribed Pain, Sleep and Anxiety Medications. It is not advisable to combine anxiety,sleep and pain medications without talking with your primary care practitioner  Special Instructions: If you have smoked or chewed Tobacco  in the last 2 yrs please stop smoking, stop any regular Alcohol  and or any Recreational drug use.  Wear Seat belts while driving.  Please note: You were cared for by a hospitalist during your hospital stay. Once you are discharged, your primary care physician will handle any further medical issues. Please note that NO REFILLS for any discharge medications will be authorized once you are discharged, as it is imperative that you return to your primary care physician (or establish a  relationship with a primary care physician if you do not have one) for your post hospital discharge needs so that they can reassess your need for medications and monitor your lab values.   Increase activity slowly   Complete by:  As directed      Allergies as of 10/07/2018      Reactions   Aldactone [spironolactone] Other (See Comments)   Hyperkalemia  reported by Dr. Lavone Ramirez - pt is currently taking 12.5 mg daily -November 2017 per medication list by same MD      Medication List    TAKE these medications   albuterol 108 (90 Base) MCG/ACT inhaler Commonly known as:  PROVENTIL HFA;VENTOLIN HFA Inhale 2 puffs into the lungs every 6 (six) hours as needed for wheezing or shortness of breath. Must keep June 2016 appt. What changed:  Another medication with the same name was changed. Make sure you understand how and when to take each.   albuterol (2.5 MG/3ML) 0.083% nebulizer solution Commonly known as:  PROVENTIL USE 1 VIAL VIA NEBULIZER 3 TIMES A DAY AS DIRECTED. DX:J44.9 What changed:  See the new instructions.   amiodarone 400 MG tablet Commonly known as:  PACERONE TAKE HALF TABLET (200 MG TOTAL) BY MOUTH DAILY. What changed:  See the new instructions.   amoxicillin-clavulanate 500-125 MG tablet Commonly known as:  AUGMENTIN Take 1 tablet (500 mg total) by mouth 3 (three) times daily.   atorvastatin 80 MG tablet Commonly known as:  LIPITOR TAKE 1 TABLET (80 MG TOTAL) BY MOUTH DAILY. What changed:  See the new instructions.  bisacodyl 5 MG EC tablet Commonly known as:  DULCOLAX Take 2 tablets (10 mg total) by mouth daily at 12 noon. What changed:    when to take this  reasons to take this   carvedilol 6.25 MG tablet Commonly known as:  COREG Take 0.5 tablets (3.125 mg total) by mouth 2 (two) times daily. Take if systolic blood pressure >656 What changed:    when to take this  additional instructions   cholecalciferol 1000 units tablet Commonly known as:   VITAMIN D Take 2,000 Units by mouth every evening.   dextromethorphan-guaiFENesin 30-600 MG 12hr tablet Commonly known as:  MUCINEX DM Take 1 tablet by mouth 2 (two) times daily as needed for cough.   feeding supplement (ENSURE ENLIVE) Liqd Take 237 mLs by mouth 2 (two) times daily between meals.   ferrous sulfate 325 (65 FE) MG tablet Take 325 mg by mouth every evening.   fluticasone 50 MCG/ACT nasal spray Commonly known as:  FLONASE SPRAY 2 SPRAYS INTO EACH NOSTRIL EVERY DAY What changed:  See the new instructions.   furosemide 40 MG tablet Commonly known as:  LASIX Take 1 tablet (40 mg total) by mouth daily. What changed:  when to take this   levothyroxine 100 MCG tablet Commonly known as:  SYNTHROID, LEVOTHROID Take 100 mcg by mouth daily before breakfast.   Potassium Chloride ER 20 MEQ Tbcr Take 20 mEq by mouth daily.   PRESCRIPTION MEDICATION Inhale into the lungs at bedtime. CPAP   PRESERVISION AREDS PO Take 1 tablet by mouth 2 (two) times daily.   SENOKOT 8.6 MG tablet Generic drug:  senna Take 1 tablet by mouth 2 (two) times daily.   traMADol 50 MG tablet Commonly known as:  ULTRAM Take 50 mg by mouth 3 (three) times daily as needed for moderate pain.   TRELEGY ELLIPTA 100-62.5-25 MCG/INH Aepb Generic drug:  Fluticasone-Umeclidin-Vilant INHALE 1 PUFF INTO LUNGS ONCE A DAY What changed:  See the new instructions.   vitamin B-12 1000 MCG tablet Commonly known as:  CYANOCOBALAMIN Take 1,000 mcg by mouth daily.   warfarin 5 MG tablet Commonly known as:  COUMADIN Take as directed. If you are unsure how to take this medication, talk to your nurse or doctor. Original instructions:  TAKE AS DIRECTED BY COUMADIN CLINIC What changed:  See the new instructions.   XULTOPHY 100-3.6 UNIT-MG/ML Sopn Generic drug:  Insulin Degludec-Liraglutide Inject 16 Units into the skin every morning.            Durable Medical Equipment  (From admission, onward)          Start     Ordered   10/07/18 0923  For home use only DME 3 n 1  Once     10/07/18 8127         Follow-up Information    [facility] Follow [facility].   Specialty:  Home Health Services Why:  For home health RN and PT. They will call you in 1-2 days to schedule your first home appointment. A bedside toilet will be also be delivered to your hospital room prior to your discharge.  Contact information: [address] [place] [address] [phone]        [name] Orn, MD. Schedule an appointment as soon as possible for a visit in 1 week(s).   Specialty:  Internal Medicine Contact information: [address], [address] [place] [address] [phone]  Allergies  Allergen Reactions  . Aldactone [Spironolactone] Other (See Comments)    Hyperkalemia  reported by Dr. Lavone Ramirez - pt is currently taking 12.5 mg daily -November 2017 per medication list by same MD    Consultations:   None  Other Procedures/Studies: Dg Chest 2 View  Result Date: 09/20/2018 CLINICAL DATA:  Chronic cough EXAM: CHEST - 2 VIEW COMPARISON:  09/17/2018 FINDINGS: Cardiac shadow is stable. Defibrillator is again noted and stable. The lungs are hyperinflated with bibasilar atelectatic changes slightly worse on the right than the left. No sizable effusion is seen. No acute bony abnormality is noted. No pneumothorax is seen IMPRESSION: Bibasilar atelectasis increased from the prior exam. Electronically Signed   By: [name] M.D.   On: 09/20/2018 08:51   Dg Chest 2 View  Result Date: 09/17/2018 CLINICAL DATA:  Dizziness and weakness for few days, vomited on Saturday morning, legs gave out, history atrial fibrillation, CHF, COPD, coronary artery disease post MI and CABG, type II diabetes mellitus EXAM: CHEST - 2 VIEW COMPARISON:  08/03/2018 FINDINGS: LEFT subclavian AICD with leads projecting at RIGHT atrium, RIGHT ventricle, and coronary sinus.  Enlargement of cardiac silhouette. Mediastinal contours and pulmonary vascularity normal. Atherosclerotic calcification aorta. RIGHT basilar atelectasis. Accentuated markings in the upper lobes slightly greater on RIGHT, unchanged. No acute infiltrate or pneumothorax. Tiny pleural effusions blunt the posterior costophrenic angles. Bones demineralized with chronic compression fracture of a vertebra at the thoracolumbar junction. IMPRESSION: Enlargement of cardiac silhouette post CABG and AICD. Bibasilar atelectasis greater on RIGHT with tiny BILATERAL pleural effusions. Electronically Signed   By: Lavonia Dana M.D.   On: 09/17/2018 09:19   Ct Chest W Contrast  Result Date: 09/17/2018 CLINICAL DATA:  84 year old male with persistent cough and shortness of breath. EXAM: CT CHEST WITH CONTRAST TECHNIQUE: Multidetector CT imaging of the chest was performed during intravenous contrast administration. CONTRAST:  74mL OMNIPAQUE IOHEXOL 300 MG/ML  SOLN COMPARISON:  01/15/2016 CT FINDINGS: Cardiovascular: Cardiomegaly, CABG changes and ICD again noted. Aortic atherosclerotic scratch de coronary artery and aortic atherosclerotic calcifications again noted. There is no evidence of thoracic aortic aneurysm or dissection. No pericardial effusion. Mediastinum/Nodes: No enlarged mediastinal, hilar, or axillary lymph nodes. Thyroid gland, trachea, and esophagus demonstrate no significant findings. Lungs/Pleura: A 1.1 x 1.3 x 1.9 cm irregular LEFT UPPER lobe nodule (series 4:39) is identified. Mild ground-glass/airspace/tree-in-bud opacities within the anterior RIGHT UPPER lobe, posterior RIGHT UPPER lobe and within the RIGHT LOWER lobe identified likely representing infection. Moderate centrilobular emphysema is identified. Mild peribronchial thickening is present. No pleural effusion or pneumothorax. Upper Abdomen: A 1.8 cm gallstone is present. No CT evidence of acute cholecystitis. Musculoskeletal: No acute or suspicious bony  abnormalities noted. Remote rib fractures and L1 fracture again noted. IMPRESSION: 1. Irregular 1.1 x 1.3 x 1.9 cm LEFT UPPER lobe nodule worrisome for malignancy. Consider one of the following for both low-risk and high-risk individuals: (a) repeat chest CT in 3 months, (b) follow-up PET-CT, or (c) tissue sampling. This recommendation follows the consensus statement: Guidelines for Management of Incidental Pulmonary Nodules Detected on CT Images: From the Fleischner Society 2017; Radiology 2017; 284:228-243. 2. Mild ground-glass/airspace/tree-in-bud opacities within the RIGHT UPPER and LOWER lobe likely representing infection. 3. Cardiomegaly and coronary artery disease.  Status post CABG. 4. Cholelithiasis 5. Aortic Atherosclerosis (ICD10-I70.0) and Emphysema (ICD10-J43.9). Electronically Signed   By: Margarette Canada M.D.   On: 09/17/2018 11:41   US Renal  Result Date: 09/19/2018 CLINICAL DATA:  Acute  kidney injury. EXAM: RENAL / URINARY TRACT ULTRASOUND COMPLETE COMPARISON:  Abdominopelvic CT 05/05/2018 FINDINGS: Right Kidney: Renal measurements: 10.7 x 6.4 x 5.6 cm = volume: 199.9 mL. Slight renal cortical thinning. Echogenicity within normal limits. No mass or hydronephrosis visualized. Left Kidney: Renal measurements: 9.9 x 5.7 x 5.2 cm = volume: 152.6 mL. Slight renal cortical thinning. Echogenicity within normal limits. No mass or hydronephrosis visualized. Bladder: Appears normal for degree of bladder distention. IMPRESSION: No obstructive uropathy.  No explanation for acute kidney injury. Electronically Signed   By: Keith Rake M.D.   On: 09/19/2018 05:44   Dg Chest Port 1 View  Result Date: 10/04/2018 CLINICAL DATA:  Severe shortness of breath EXAM: PORTABLE CHEST 1 VIEW COMPARISON:  09/20/2018 FINDINGS: Extensive airspace disease asymmetric to the right. Chronic volume loss on the right pleural thickening. There is a background of COPD with left lung hyperinflation. Biventricular ICD/pacer  leads from the left in stable position. Stable cardiomegaly. Status post CABG IMPRESSION: 1. Extensive airspace disease on the right consistent with pneumonia in the appropriate clinical setting. 2. COPD. Electronically Signed   By: Monte Fantasia M.D.   On: 10/04/2018 10:09     TODAY-DAY OF DISCHARGE:  Subjective:   Shelton Silvas today has no headache,no chest abdominal pain,no new weakness tingling or numbness, feels much better wants to go home today.   Objective:   Blood pressure 119/71, pulse 91, temperature 98.1 F (36.7 C), resp. rate 18, height 5' 7.99" (1.727 m), weight 89 kg, SpO2 92 %.  Intake/Output Summary (Last 24 hours) at 10/07/2018 0939 Last data filed at 10/07/2018 0934 Gross per 24 hour  Intake 600 ml  Output -  Net 600 ml   Filed Weights   10/04/18 0944  Weight: 89 kg    Exam: Awake Alert, Oriented *3, No new F.N deficits, Normal affect [employer].AT,PERRAL Supple Neck,No JVD, No cervical lymphadenopathy appriciated.  Symmetrical Chest wall movement, Good air movement bilaterally, CTAB RRR,No Gallops,Rubs or new Murmurs, No Parasternal Heave +ve B.Sounds, Abd Soft, Non tender, No organomegaly appriciated, No rebound -guarding or rigidity. No Cyanosis, Clubbing or edema, No new Rash or bruise   PERTINENT RADIOLOGIC STUDIES: Dg Chest 2 View  Result Date: 09/20/2018 CLINICAL DATA:  Chronic cough EXAM: CHEST - 2 VIEW COMPARISON:  09/17/2018 FINDINGS: Cardiac shadow is stable. Defibrillator is again noted and stable. The lungs are hyperinflated with bibasilar atelectatic changes slightly worse on the right than the left. No sizable effusion is seen. No acute bony abnormality is noted. No pneumothorax is seen IMPRESSION: Bibasilar atelectasis increased from the prior exam. Electronically Signed   By: [name] M.D.   On: 09/20/2018 08:51   Dg Chest 2 View  Result Date: 09/17/2018 CLINICAL DATA:  Dizziness and weakness for few days, vomited on Saturday  morning, legs gave out, history atrial fibrillation, CHF, COPD, coronary artery disease post MI and CABG, type II diabetes mellitus EXAM: CHEST - 2 VIEW COMPARISON:  08/03/2018 FINDINGS: LEFT subclavian AICD with leads projecting at RIGHT atrium, RIGHT ventricle, and coronary sinus. Enlargement of cardiac silhouette. Mediastinal contours and pulmonary vascularity normal. Atherosclerotic calcification aorta. RIGHT basilar atelectasis. Accentuated markings in the upper lobes slightly greater on RIGHT, unchanged. No acute infiltrate or pneumothorax. Tiny pleural effusions blunt the posterior costophrenic angles. Bones demineralized with chronic compression fracture of a vertebra at the thoracolumbar junction. IMPRESSION: Enlargement of cardiac silhouette post CABG and AICD. Bibasilar atelectasis greater on RIGHT with tiny BILATERAL pleural effusions. Electronically Signed  By: Lavonia Dana M.D.   On: 09/17/2018 09:19   Ct Chest W Contrast  Result Date: 09/17/2018 CLINICAL DATA:  84 year old male with persistent cough and shortness of breath. EXAM: CT CHEST WITH CONTRAST TECHNIQUE: Multidetector CT imaging of the chest was performed during intravenous contrast administration. CONTRAST:  38mL OMNIPAQUE IOHEXOL 300 MG/ML  SOLN COMPARISON:  01/15/2016 CT FINDINGS: Cardiovascular: Cardiomegaly, CABG changes and ICD again noted. Aortic atherosclerotic scratch de coronary artery and aortic atherosclerotic calcifications again noted. There is no evidence of thoracic aortic aneurysm or dissection. No pericardial effusion. Mediastinum/Nodes: No enlarged mediastinal, hilar, or axillary lymph nodes. Thyroid gland, trachea, and esophagus demonstrate no significant findings. Lungs/Pleura: A 1.1 x 1.3 x 1.9 cm irregular LEFT UPPER lobe nodule (series 4:39) is identified. Mild ground-glass/airspace/tree-in-bud opacities within the anterior RIGHT UPPER lobe, posterior RIGHT UPPER lobe and within the RIGHT LOWER lobe identified  likely representing infection. Moderate centrilobular emphysema is identified. Mild peribronchial thickening is present. No pleural effusion or pneumothorax. Upper Abdomen: A 1.8 cm gallstone is present. No CT evidence of acute cholecystitis. Musculoskeletal: No acute or suspicious bony abnormalities noted. Remote rib fractures and L1 fracture again noted. IMPRESSION: 1. Irregular 1.1 x 1.3 x 1.9 cm LEFT UPPER lobe nodule worrisome for malignancy. Consider one of the following for both low-risk and high-risk individuals: (a) repeat chest CT in 3 months, (b) follow-up PET-CT, or (c) tissue sampling. This recommendation follows the consensus statement: Guidelines for Management of Incidental Pulmonary Nodules Detected on CT Images: From the Fleischner Society 2017; Radiology 2017; 284:228-243. 2. Mild ground-glass/airspace/tree-in-bud opacities within the RIGHT UPPER and LOWER lobe likely representing infection. 3. Cardiomegaly and coronary artery disease.  Status post CABG. 4. Cholelithiasis 5. Aortic Atherosclerosis (ICD10-I70.0) and Emphysema (ICD10-J43.9). Electronically Signed   By: Margarette Canada M.D.   On: 09/17/2018 11:41   US Renal  Result Date: 09/19/2018 CLINICAL DATA:  Acute kidney injury. EXAM: RENAL / URINARY TRACT ULTRASOUND COMPLETE COMPARISON:  Abdominopelvic CT 05/05/2018 FINDINGS: Right Kidney: Renal measurements: 10.7 x 6.4 x 5.6 cm = volume: 199.9 mL. Slight renal cortical thinning. Echogenicity within normal limits. No mass or hydronephrosis visualized. Left Kidney: Renal measurements: 9.9 x 5.7 x 5.2 cm = volume: 152.6 mL. Slight renal cortical thinning. Echogenicity within normal limits. No mass or hydronephrosis visualized. Bladder: Appears normal for degree of bladder distention. IMPRESSION: No obstructive uropathy.  No explanation for acute kidney injury. Electronically Signed   By: Keith Rake M.D.   On: 09/19/2018 05:44   Dg Chest Port 1 View  Result Date: 10/04/2018 CLINICAL  DATA:  Severe shortness of breath EXAM: PORTABLE CHEST 1 VIEW COMPARISON:  09/20/2018 FINDINGS: Extensive airspace disease asymmetric to the right. Chronic volume loss on the right pleural thickening. There is a background of COPD with left lung hyperinflation. Biventricular ICD/pacer leads from the left in stable position. Stable cardiomegaly. Status post CABG IMPRESSION: 1. Extensive airspace disease on the right consistent with pneumonia in the appropriate clinical setting. 2. COPD. Electronically Signed   By: Monte Fantasia M.D.   On: 10/04/2018 10:09     PERTINENT LAB RESULTS: CBC: Recent Labs    10/05/18 0441 10/06/18 0408  WBC 14.2* 13.2*  HGB 10.4* 10.7*  HCT 33.9* 32.9*  PLT 148* 169   CMET CMP     Component Value Date/Time   NA 138 10/06/2018 0408   NA 139 03/13/2018 0805   K 3.5 10/06/2018 0408   CL 105 10/06/2018 0408   CO2 22 10/06/2018  0408   GLUCOSE 127 (H) 10/06/2018 0408   BUN 28 (H) 10/06/2018 0408   BUN 19 03/13/2018 0805   CREATININE 1.33 (H) 10/06/2018 0408   CREATININE 1.69 (H) 07/02/2016 1027   CALCIUM 8.2 (L) 10/06/2018 0408   PROT 5.0 (L) 10/04/2018 1003   PROT 6.5 12/30/2017 0920   ALBUMIN 2.5 (L) 10/04/2018 1003   ALBUMIN 3.9 12/30/2017 0920   AST 37 10/04/2018 1003   ALT 18 10/04/2018 1003   ALKPHOS 58 10/04/2018 1003   BILITOT 1.6 (H) 10/04/2018 1003   BILITOT 0.4 12/30/2017 0920   GFRNONAA 49 (L) 10/06/2018 0408   GFRAA 57 (L) 10/06/2018 0408    GFR Estimated Creatinine Clearance: 46.4 mL/min (A) (by C-G formula based on SCr of 1.33 mg/dL (H)). No results for input(s): LIPASE, AMYLASE in the last 72 hours. No results for input(s): CKTOTAL, CKMB, CKMBINDEX, TROPONINI in the last 72 hours. Invalid input(s): POCBNP No results for input(s): DDIMER in the last 72 hours. No results for input(s): HGBA1C in the last 72 hours. No results for input(s): CHOL, HDL, LDLCALC, TRIG, CHOLHDL, LDLDIRECT in the last 72 hours. No results for input(s):  TSH, T4TOTAL, T3FREE, THYROIDAB in the last 72 hours.  Invalid input(s): FREET3 No results for input(s): VITAMINB12, FOLATE, FERRITIN, TIBC, IRON, RETICCTPCT in the last 72 hours. Coags: Recent Labs    10/06/18 0408 10/07/18 0240  INR 3.65 2.57   Microbiology: Recent Results (from the past 240 hour(s))  Culture, blood (Routine x 2)     Status: None (Preliminary result)   Collection Time: 10/04/18 10:03 AM  Result Value Ref Range Status   Specimen Description BLOOD LEFT ANTECUBITAL  Final   Special Requests   Final    BOTTLES DRAWN AEROBIC AND ANAEROBIC Blood Culture adequate volume   Culture   Final    NO GROWTH 2 DAYS Performed at [facility], [address], [place], [place] [address]    Report Status PENDING  Incomplete  Culture, blood (Routine x 2)     Status: None (Preliminary result)   Collection Time: 10/04/18  3:35 PM  Result Value Ref Range Status   Specimen Description BLOOD RIGHT WRIST  Final   Special Requests   Final    BOTTLES DRAWN AEROBIC AND ANAEROBIC Blood Culture adequate volume   Culture   Final    NO GROWTH 2 DAYS Performed at [facility], [address], [place], [place] [address]    Report Status PENDING  Incomplete  MRSA PCR Screening     Status: None   Collection Time: 10/04/18  6:39 PM  Result Value Ref Range Status   MRSA by PCR NEGATIVE NEGATIVE Final    Comment:        The GeneXpert MRSA Assay (FDA approved for NASAL specimens only), is one component of a comprehensive MRSA colonization surveillance program. It is not intended to diagnose MRSA infection nor to guide or monitor treatment for MRSA infections. Performed at [facility], [place] [address], [place], [place] [address]   Culture, sputum-assessment     Status: None   Collection Time: 10/05/18  8:51 AM  Result Value Ref Range Status   Specimen Description SPUTUM  Final   Special Requests NONE  Final   Sputum evaluation   Final    THIS SPECIMEN IS ACCEPTABLE  FOR SPUTUM CULTURE Performed at [facility], [address] [place], [place] [address]    Report Status 10/05/2018 FINAL  Final  Culture, respiratory     Status: None (Preliminary result)   Collection Time: 10/05/18  8:51 AM  Result Value Ref Range Status   Specimen Description SPUTUM  Final   Special Requests NONE Reflexed from [id number]  Final   Gram Stain   Final    MODERATE WBC PRESENT, PREDOMINANTLY PMN FEW GRAM POSITIVE RODS FEW YEAST RARE GRAM NEGATIVE RODS Performed at [facility], [place] [address] [place], [place] [address]    Culture FEW KLEBSIELLA PNEUMONIAE  Final   Report Status PENDING  Incomplete    FURTHER DISCHARGE INSTRUCTIONS:  Get Medicines reviewed and adjusted: Please take all your medications with you for your next visit with your Primary MD  Laboratory/radiological data: Please request your Primary MD to go over all hospital tests and procedure/radiological results at the follow up, please ask your Primary MD to get all Hospital records sent to his/her office.  In some cases, they will be blood work, cultures and biopsy results pending at the time of your discharge. Please request that your primary care M.D. goes through all the records of your hospital data and follows up on these results.  Also Note the following: If you experience worsening of your admission symptoms, develop shortness of breath, life threatening emergency, suicidal or homicidal thoughts you must seek medical attention immediately by calling 911 or calling your MD immediately  if symptoms less severe.  You must read complete instructions/literature along with all the possible adverse reactions/side effects for all the Medicines you take and that have been prescribed to you. Take any new Medicines after you have completely understood and accpet all the possible adverse reactions/side effects.   Do not drive when taking Pain medications or sleeping medications  (Benzodaizepines)  Do not take more than prescribed Pain, Sleep and Anxiety Medications. It is not advisable to combine anxiety,sleep and pain medications without talking with your primary care practitioner  Special Instructions: If you have smoked or chewed Tobacco  in the last 2 yrs please stop smoking, stop any regular Alcohol  and or any Recreational drug use.  Wear Seat belts while driving.  Please note: You were cared for by a hospitalist during your hospital stay. Once you are discharged, your primary care physician will handle any further medical issues. Please note that NO REFILLS for any discharge medications will be authorized once you are discharged, as it is imperative that you return to your primary care physician (or establish a relationship with a primary care physician if you do not have one) for your post hospital discharge needs so that they can reassess your need for medications and monitor your lab values.  Total Time spent coordinating discharge including counseling, education and face to face time equals 35 minutes.  SignedOren Binet 10/07/2018 9:39 AM

## 2018-10-07 NOTE — Progress Notes (Signed)
CSW received consult regarding PT recommendation of SNF at discharge.  CSW spoke with patient and wife at bedside. Patient is refusing SNF and would like to return home at discharge. CSW has discussed the patient's current level of function related to mobility with the patient and spouse. They acknowledge understanding of this and feel the patient would be able to have their care needs met at home. They request [facility] and a bedside commode. Patient's spouse will transport him by car. RNCM aware.   CSW signing off. Please re-consult if needed.    [name] Locus [place]esti [place]erg LCSW [phone]

## 2018-10-07 NOTE — Progress Notes (Signed)
Nsg Discharge Note  Admit Date:  10/04/2018 Discharge date: 10/07/2018   [name] to be D/C'd Home with [facility] per MD order.  AVS teaching completed.  Patient/caregiver able to verbalize understanding.  Skin clean, dry and intact without evidence of skin break down, no evidence of skin tears noted. IV catheter discontinued intact. Site without signs and symptoms of complications - no redness or edema noted at insertion site, patient denies c/o pain - only slight tenderness at site.  Dressing with slight pressure applied.  D/c Instructions-Education: Discharge instructions given to patient/family with verbalized understanding. D/c education completed with patient/family including follow up instructions, medication list, d/c activities limitations if indicated, with other d/c instructions as indicated by MD - patient able to verbalize understanding, all questions fully answered. Patient instructed to return to ED, call 911, or call MD for any changes in condition.  Patient being discharged home with 3-1 bedside commode.  Patient escorted via [place], and D/C home via private auto.  Hiram Comber, RN 10/07/2018 9:55 AM

## 2018-10-07 NOTE — Care Management Note (Signed)
Case Management Note  Patient Details  Name: [name] MRN: [id number] Date of Birth: 01/05/1934  Subjective/Objective:                    Action/Plan:  Patient declines SNF at this time, would like to go home and use [facility] for [facility] services. Referral placed for RN PT. Also would like 3/1, order placed and it will be delivered to room prior to DC. No other CM needs identified.  Expected Discharge Date:  10/07/18               Expected Discharge Plan:  [place]  In-House Referral:  Clinical Social Work  Discharge planning Services  CM Consult  Post Acute Care Choice:    Choice offered to:     DME Arranged:  3-N-1 DME Agency:  [place]:  RN, PT [facility] Agency:  [place]  Status of Service:  Completed, signed off  If discussed at [place] of Stay Meetings, dates discussed:    Additional Comments:  Carles Collet, RN 10/07/2018, 9:24 AM

## 2018-10-09 DIAGNOSIS — Z794 Long term (current) use of insulin: Secondary | ICD-10-CM | POA: Diagnosis not present

## 2018-10-09 DIAGNOSIS — G4733 Obstructive sleep apnea (adult) (pediatric): Secondary | ICD-10-CM | POA: Diagnosis not present

## 2018-10-09 DIAGNOSIS — E785 Hyperlipidemia, unspecified: Secondary | ICD-10-CM | POA: Diagnosis not present

## 2018-10-09 DIAGNOSIS — E039 Hypothyroidism, unspecified: Secondary | ICD-10-CM | POA: Diagnosis not present

## 2018-10-09 DIAGNOSIS — I252 Old myocardial infarction: Secondary | ICD-10-CM | POA: Diagnosis not present

## 2018-10-09 DIAGNOSIS — I130 Hypertensive heart and chronic kidney disease with heart failure and stage 1 through stage 4 chronic kidney disease, or unspecified chronic kidney disease: Secondary | ICD-10-CM | POA: Diagnosis not present

## 2018-10-09 DIAGNOSIS — N183 Chronic kidney disease, stage 3 (moderate): Secondary | ICD-10-CM | POA: Diagnosis not present

## 2018-10-09 DIAGNOSIS — E1122 Type 2 diabetes mellitus with diabetic chronic kidney disease: Secondary | ICD-10-CM | POA: Diagnosis not present

## 2018-10-09 DIAGNOSIS — I5022 Chronic systolic (congestive) heart failure: Secondary | ICD-10-CM | POA: Diagnosis not present

## 2018-10-09 DIAGNOSIS — Z95810 Presence of automatic (implantable) cardiac defibrillator: Secondary | ICD-10-CM | POA: Diagnosis not present

## 2018-10-09 DIAGNOSIS — J439 Emphysema, unspecified: Secondary | ICD-10-CM | POA: Diagnosis not present

## 2018-10-09 DIAGNOSIS — Z951 Presence of aortocoronary bypass graft: Secondary | ICD-10-CM | POA: Diagnosis not present

## 2018-10-09 DIAGNOSIS — N400 Enlarged prostate without lower urinary tract symptoms: Secondary | ICD-10-CM | POA: Diagnosis not present

## 2018-10-09 DIAGNOSIS — I2510 Atherosclerotic heart disease of native coronary artery without angina pectoris: Secondary | ICD-10-CM | POA: Diagnosis not present

## 2018-10-09 DIAGNOSIS — Z9181 History of falling: Secondary | ICD-10-CM | POA: Diagnosis not present

## 2018-10-09 DIAGNOSIS — D509 Iron deficiency anemia, unspecified: Secondary | ICD-10-CM | POA: Diagnosis not present

## 2018-10-09 DIAGNOSIS — I480 Paroxysmal atrial fibrillation: Secondary | ICD-10-CM | POA: Diagnosis not present

## 2018-10-09 DIAGNOSIS — Z8701 Personal history of pneumonia (recurrent): Secondary | ICD-10-CM | POA: Diagnosis not present

## 2018-10-09 DIAGNOSIS — Z7901 Long term (current) use of anticoagulants: Secondary | ICD-10-CM | POA: Diagnosis not present

## 2018-10-09 DIAGNOSIS — J9611 Chronic respiratory failure with hypoxia: Secondary | ICD-10-CM | POA: Diagnosis not present

## 2018-10-09 LAB — CULTURE, BLOOD (ROUTINE X 2)
Culture: NO GROWTH
Culture: NO GROWTH
Special Requests: ADEQUATE
Special Requests: ADEQUATE

## 2018-10-11 DIAGNOSIS — J189 Pneumonia, unspecified organism: Secondary | ICD-10-CM | POA: Diagnosis not present

## 2018-10-11 DIAGNOSIS — K8020 Calculus of gallbladder without cholecystitis without obstruction: Secondary | ICD-10-CM | POA: Diagnosis not present

## 2018-10-11 DIAGNOSIS — Y95 Nosocomial condition: Secondary | ICD-10-CM | POA: Diagnosis not present

## 2018-10-11 DIAGNOSIS — I5023 Acute on chronic systolic (congestive) heart failure: Secondary | ICD-10-CM | POA: Diagnosis not present

## 2018-10-11 DIAGNOSIS — R911 Solitary pulmonary nodule: Secondary | ICD-10-CM | POA: Diagnosis not present

## 2018-10-12 ENCOUNTER — Ambulatory Visit (INDEPENDENT_AMBULATORY_CARE_PROVIDER_SITE_OTHER): Payer: PPO

## 2018-10-12 DIAGNOSIS — Z95810 Presence of automatic (implantable) cardiac defibrillator: Secondary | ICD-10-CM | POA: Diagnosis not present

## 2018-10-12 DIAGNOSIS — I255 Ischemic cardiomyopathy: Secondary | ICD-10-CM

## 2018-10-12 DIAGNOSIS — I5022 Chronic systolic (congestive) heart failure: Secondary | ICD-10-CM

## 2018-10-12 NOTE — Progress Notes (Signed)
Remote ICD transmission.   

## 2018-10-12 NOTE — Progress Notes (Signed)
EPIC Encounter for ICM Monitoring  Patient Name: [name] is a 84 y.o. male Date: 10/12/2018 Primary Care Physican: [name], MD Primary [place] Electrophysiologist: Allred BiV Pacing: 51.2%  (decreased from 98.6% on 09/26/2018 report)       Last Weight: 202 lbs Today's Weight: 198 lbs   Clinical Status (26-Sep-2018 to 12-Oct-2018)  Treated VT/VF 0 episodes  AT/AF 12 episodes  Time in AT/AF 16.0 hr/day (66.7%)   Longest AT/AF 4 days Observations (3) (26-Sep-2018 to 12-Oct-2018)  AT/AF >= 6 hr for 11 days.  V. Pacing less than 90%.  Patient Activity less than 1 hr/day for 2 weeks.      Heart Failure questions reviewed, pt feeling better since hospitalization but PCP determined he has fluid accumulation.  Per 10/07/2018 hospital discharge note, patient was admitted to the hospital for Septic shock with acute hypoxic respiratory failure secondary to right lobar pneumonia.     Thoracic impedance abnormal suggesting fluid accumulation.   Prescribed: Per wife, Furosemide 40 mg was increased to 1 tablet twice a day by PCP Dr Laurann Montana at today's (10/12/2018) appointment.   Recommendations: No changes.  Encouraged to call for fluid symptoms.  Follow-up plan: ICM clinic phone appointment on 10/19/2018 to recheck fluid levels.      Copy of ICM check sent to [name] and Dr Rayann Heman for review and recommendations if needed.   AT/AF   3 month ICM trend: 10/12/2018    1 Year ICM trend:       Rosalene Billings, RN 10/12/2018 5:10 PM

## 2018-10-19 ENCOUNTER — Encounter: Payer: PPO | Admitting: Neurology

## 2018-10-19 ENCOUNTER — Ambulatory Visit (INDEPENDENT_AMBULATORY_CARE_PROVIDER_SITE_OTHER): Payer: PPO

## 2018-10-19 DIAGNOSIS — Z79899 Other long term (current) drug therapy: Secondary | ICD-10-CM | POA: Diagnosis not present

## 2018-10-19 DIAGNOSIS — Z95810 Presence of automatic (implantable) cardiac defibrillator: Secondary | ICD-10-CM

## 2018-10-19 DIAGNOSIS — I5022 Chronic systolic (congestive) heart failure: Secondary | ICD-10-CM

## 2018-10-19 NOTE — Progress Notes (Signed)
EPIC Encounter for ICM Monitoring  Patient Name: [name] is a 84 y.o. male Date: 10/19/2018 Primary Care Physican: [name], MD Primary [place] Electrophysiologist: Allred BiV Pacing: 98.2%     Last Weight: 202 lbs Today's Weight: 190 lbs   Clinical Status (12-Oct-2018 to 19-Oct-2018) Treated VT/VF 0 episodes  AT/AF 0 episodes  Time in AT/AF 0.0 hr/day (0.0%)                                                          Heart Failure questions reviewed and still improving since hospitalization. Patient had visit with Dr Laurann Montana today and he thought patient is doing much better.        Thoracic impedance improved since Furosemide was increased to bid on 12/19 by Dr Laurann Montana.  Advised the report does suggest he still has some fluid but Dr Laurann Montana is happy with his progress.  Prescribed: Per wife, Furosemide 40 mg was increased to 1 tablet twice a day by PCP Dr Laurann Montana at today's (10/12/2018) appointment.   Recommendations: No changes.  Encouraged to call for fluid symptoms.  Follow-up plan: ICM clinic phone appointment on 11/16/2018  Copy of ICM check sent to [name] and Dr Rayann Heman.  3 month ICM trend: 10/19/2018    1 Year ICM trend:       Rosalene Billings, RN 10/19/2018 12:37 PM

## 2018-10-26 DIAGNOSIS — I129 Hypertensive chronic kidney disease with stage 1 through stage 4 chronic kidney disease, or unspecified chronic kidney disease: Secondary | ICD-10-CM | POA: Diagnosis not present

## 2018-10-26 DIAGNOSIS — N183 Chronic kidney disease, stage 3 (moderate): Secondary | ICD-10-CM | POA: Diagnosis not present

## 2018-10-26 DIAGNOSIS — J449 Chronic obstructive pulmonary disease, unspecified: Secondary | ICD-10-CM | POA: Diagnosis not present

## 2018-10-26 DIAGNOSIS — E109 Type 1 diabetes mellitus without complications: Secondary | ICD-10-CM | POA: Diagnosis not present

## 2018-10-26 DIAGNOSIS — E1142 Type 2 diabetes mellitus with diabetic polyneuropathy: Secondary | ICD-10-CM | POA: Diagnosis not present

## 2018-10-26 DIAGNOSIS — I5023 Acute on chronic systolic (congestive) heart failure: Secondary | ICD-10-CM | POA: Diagnosis not present

## 2018-10-26 DIAGNOSIS — E039 Hypothyroidism, unspecified: Secondary | ICD-10-CM | POA: Diagnosis not present

## 2018-10-26 DIAGNOSIS — I480 Paroxysmal atrial fibrillation: Secondary | ICD-10-CM | POA: Diagnosis not present

## 2018-10-26 DIAGNOSIS — I5022 Chronic systolic (congestive) heart failure: Secondary | ICD-10-CM | POA: Diagnosis not present

## 2018-10-26 DIAGNOSIS — E1122 Type 2 diabetes mellitus with diabetic chronic kidney disease: Secondary | ICD-10-CM | POA: Diagnosis not present

## 2018-10-26 DIAGNOSIS — E1151 Type 2 diabetes mellitus with diabetic peripheral angiopathy without gangrene: Secondary | ICD-10-CM | POA: Diagnosis not present

## 2018-10-26 DIAGNOSIS — I25118 Atherosclerotic heart disease of native coronary artery with other forms of angina pectoris: Secondary | ICD-10-CM | POA: Diagnosis not present

## 2018-10-30 ENCOUNTER — Ambulatory Visit: Payer: Self-pay | Admitting: *Deleted

## 2018-10-30 ENCOUNTER — Encounter (INDEPENDENT_AMBULATORY_CARE_PROVIDER_SITE_OTHER): Payer: Self-pay

## 2018-10-30 DIAGNOSIS — Z7901 Long term (current) use of anticoagulants: Secondary | ICD-10-CM

## 2018-10-30 DIAGNOSIS — Z5181 Encounter for therapeutic drug level monitoring: Secondary | ICD-10-CM

## 2018-10-30 DIAGNOSIS — I480 Paroxysmal atrial fibrillation: Secondary | ICD-10-CM

## 2018-10-30 DIAGNOSIS — I4819 Other persistent atrial fibrillation: Secondary | ICD-10-CM

## 2018-10-30 LAB — POCT INR: INR: 1.3 — AB (ref 2.0–3.0)

## 2018-10-30 NOTE — Patient Instructions (Signed)
Description   Today and tomorrow take 1.5 tablets then continue taking 1 tablet everyday except 1/2 tablet on Wednesdays and Saturdays.  Continue eating 2-3 servings of leafy green vegetable each week.  Recheck in 1 week.   Call [phone] with any new medications.

## 2018-11-07 ENCOUNTER — Ambulatory Visit (INDEPENDENT_AMBULATORY_CARE_PROVIDER_SITE_OTHER): Payer: HMO

## 2018-11-07 DIAGNOSIS — Z5181 Encounter for therapeutic drug level monitoring: Secondary | ICD-10-CM | POA: Diagnosis not present

## 2018-11-07 DIAGNOSIS — I480 Paroxysmal atrial fibrillation: Secondary | ICD-10-CM | POA: Diagnosis not present

## 2018-11-07 DIAGNOSIS — Z7901 Long term (current) use of anticoagulants: Secondary | ICD-10-CM

## 2018-11-07 DIAGNOSIS — I4819 Other persistent atrial fibrillation: Secondary | ICD-10-CM | POA: Diagnosis not present

## 2018-11-07 LAB — POCT INR: INR: 1.5 — AB (ref 2.0–3.0)

## 2018-11-07 NOTE — Patient Instructions (Signed)
Please take 1.5 tablets tonight, 1 tablet tomorrow, then continue taking 1 tablet everyday except 1/2 tablet on Wednesdays and Saturdays.  Continue eating 2-3 servings of leafy green vegetable each week.  Recheck in 1 week in [place].  [place]. Please call Donah Driver if you get lost @ [phone] or [phone].

## 2018-11-12 LAB — CUP PACEART REMOTE DEVICE CHECK
Battery Remaining Longevity: 13 mo
Battery Voltage: 2.89 V
Brady Statistic AP VP Percent: 25.86 %
Brady Statistic AP VS Percent: 10.68 %
Brady Statistic AS VP Percent: 25.58 %
Brady Statistic AS VS Percent: 37.88 %
Brady Statistic RA Percent Paced: 33.82 %
Brady Statistic RV Percent Paced: 46.45 %
Date Time Interrogation Session: 20191219072822
HighPow Impedance: 45 Ohm
HighPow Impedance: 55 Ohm
Implantable Lead Implant Date: 20091215
Implantable Lead Implant Date: 20091215
Implantable Lead Implant Date: 20091215
Implantable Lead Location: 753858
Implantable Lead Location: 753859
Implantable Lead Location: 753860
Implantable Lead Model: 4196
Implantable Lead Model: 5076
Implantable Lead Model: 6947
Implantable Pulse Generator Implant Date: 20150113
Lead Channel Impedance Value: 4047 Ohm
Lead Channel Impedance Value: 456 Ohm
Lead Channel Impedance Value: 475 Ohm
Lead Channel Impedance Value: 513 Ohm
Lead Channel Impedance Value: 513 Ohm
Lead Channel Impedance Value: 893 Ohm
Lead Channel Pacing Threshold Amplitude: 0.875 V
Lead Channel Pacing Threshold Amplitude: 1 V
Lead Channel Pacing Threshold Amplitude: 1.625 V
Lead Channel Pacing Threshold Pulse Width: 0.4 ms
Lead Channel Pacing Threshold Pulse Width: 0.4 ms
Lead Channel Pacing Threshold Pulse Width: 0.6 ms
Lead Channel Sensing Intrinsic Amplitude: 0.5 mV
Lead Channel Sensing Intrinsic Amplitude: 0.5 mV
Lead Channel Sensing Intrinsic Amplitude: 10.5 mV
Lead Channel Sensing Intrinsic Amplitude: 10.5 mV
Lead Channel Setting Pacing Amplitude: 1.5 V
Lead Channel Setting Pacing Amplitude: 2 V
Lead Channel Setting Pacing Amplitude: 2.5 V
Lead Channel Setting Pacing Pulse Width: 0.6 ms
Lead Channel Setting Pacing Pulse Width: 1 ms
Lead Channel Setting Sensing Sensitivity: 0.3 mV

## 2018-11-13 ENCOUNTER — Ambulatory Visit (INDEPENDENT_AMBULATORY_CARE_PROVIDER_SITE_OTHER): Payer: HMO

## 2018-11-13 DIAGNOSIS — Z5181 Encounter for therapeutic drug level monitoring: Secondary | ICD-10-CM

## 2018-11-13 DIAGNOSIS — I4819 Other persistent atrial fibrillation: Secondary | ICD-10-CM | POA: Diagnosis not present

## 2018-11-13 DIAGNOSIS — I480 Paroxysmal atrial fibrillation: Secondary | ICD-10-CM | POA: Diagnosis not present

## 2018-11-13 DIAGNOSIS — Z7901 Long term (current) use of anticoagulants: Secondary | ICD-10-CM

## 2018-11-13 LAB — POCT INR: INR: 1.8 — AB (ref 2.0–3.0)

## 2018-11-13 NOTE — Patient Instructions (Signed)
Please take 1.5 tablets tonight, then continue taking 1 tablet everyday except 1/2 tablet on Wednesdays and Saturdays.     Recheck in 2 weeks in [place].  [place]. Please call Donah Driver if you get lost @ [phone] or [phone].

## 2018-11-16 ENCOUNTER — Ambulatory Visit (INDEPENDENT_AMBULATORY_CARE_PROVIDER_SITE_OTHER): Payer: HMO

## 2018-11-16 DIAGNOSIS — I5022 Chronic systolic (congestive) heart failure: Secondary | ICD-10-CM

## 2018-11-16 DIAGNOSIS — Z95810 Presence of automatic (implantable) cardiac defibrillator: Secondary | ICD-10-CM

## 2018-11-16 NOTE — Progress Notes (Signed)
EPIC Encounter for ICM Monitoring  Patient Name: [name] is a 85 y.o. male Date: 11/16/2018 Primary Care Physican: [name], MD Primary [place] Electrophysiologist: Allred BiV Pacing:96.9% Last Weight:190 lbs Today's Weight:189lbs   Clinical Status (19-Oct-2018 to 16-Nov-2018)  Treated VT/VF 0 episodes  AT/AF 1 episode   Time in AT/AF 0.7 hr/day (2.7%)  Observations (2) (19-Oct-2018 to 16-Nov-2018)  AT/AF >= 6 hr for 2 days.  Patient Activity less than 1 hr/day for 4 weeks.   Spoke with wife.  Heart Failure questions reviewed and patient asymptomatic.    Thoracic impedance normal    Prescribed:Per wife,Furosemide40 mg was increased to 1 tablet twice a day by PCP Dr Laurann Montana 10/12/2018.  Recommendations:No changes. Encouraged to call for fluid symptoms.  Follow-up plan: ICM clinic phone appointment on2/25/2020  3 month ICM trend: 11/16/2018    1 Year ICM trend:       Rosalene Billings, RN 11/16/2018 4:17 PM

## 2018-11-27 ENCOUNTER — Ambulatory Visit (INDEPENDENT_AMBULATORY_CARE_PROVIDER_SITE_OTHER): Payer: HMO

## 2018-11-27 DIAGNOSIS — Z7901 Long term (current) use of anticoagulants: Secondary | ICD-10-CM | POA: Diagnosis not present

## 2018-11-27 DIAGNOSIS — I4819 Other persistent atrial fibrillation: Secondary | ICD-10-CM

## 2018-11-27 DIAGNOSIS — Z5181 Encounter for therapeutic drug level monitoring: Secondary | ICD-10-CM | POA: Diagnosis not present

## 2018-11-27 DIAGNOSIS — I480 Paroxysmal atrial fibrillation: Secondary | ICD-10-CM | POA: Diagnosis not present

## 2018-11-27 LAB — POCT INR: INR: 1.7 — AB (ref 2.0–3.0)

## 2018-11-27 NOTE — Patient Instructions (Signed)
Please take 1.5 tablets tonight, then START NEW DOSAGE OF 1 TABLET EVERY DAY.    Recheck in 2 weeks in [place].  [place].  Please call Donah Driver if you get lost @ [phone] or [phone].

## 2018-12-04 DIAGNOSIS — E1122 Type 2 diabetes mellitus with diabetic chronic kidney disease: Secondary | ICD-10-CM | POA: Diagnosis not present

## 2018-12-04 DIAGNOSIS — R0789 Other chest pain: Secondary | ICD-10-CM | POA: Diagnosis not present

## 2018-12-04 DIAGNOSIS — Z79899 Other long term (current) drug therapy: Secondary | ICD-10-CM | POA: Diagnosis not present

## 2018-12-04 DIAGNOSIS — I5022 Chronic systolic (congestive) heart failure: Secondary | ICD-10-CM | POA: Diagnosis not present

## 2018-12-04 DIAGNOSIS — E1151 Type 2 diabetes mellitus with diabetic peripheral angiopathy without gangrene: Secondary | ICD-10-CM | POA: Diagnosis not present

## 2018-12-04 DIAGNOSIS — I10 Essential (primary) hypertension: Secondary | ICD-10-CM | POA: Diagnosis not present

## 2018-12-11 ENCOUNTER — Ambulatory Visit (INDEPENDENT_AMBULATORY_CARE_PROVIDER_SITE_OTHER): Payer: HMO

## 2018-12-11 DIAGNOSIS — I4819 Other persistent atrial fibrillation: Secondary | ICD-10-CM | POA: Diagnosis not present

## 2018-12-11 DIAGNOSIS — Z5181 Encounter for therapeutic drug level monitoring: Secondary | ICD-10-CM | POA: Diagnosis not present

## 2018-12-11 DIAGNOSIS — Z7901 Long term (current) use of anticoagulants: Secondary | ICD-10-CM | POA: Diagnosis not present

## 2018-12-11 DIAGNOSIS — I480 Paroxysmal atrial fibrillation: Secondary | ICD-10-CM

## 2018-12-11 LAB — POCT INR: INR: 2 (ref 2.0–3.0)

## 2018-12-11 NOTE — Patient Instructions (Signed)
Please continue dosage 1 TABLET EVERY DAY.    Recheck in 3 weeks in [place].   [place].  Please call Donah Driver if you get lost @ [phone] or [phone].

## 2018-12-19 ENCOUNTER — Ambulatory Visit (INDEPENDENT_AMBULATORY_CARE_PROVIDER_SITE_OTHER): Payer: HMO

## 2018-12-19 DIAGNOSIS — N183 Chronic kidney disease, stage 3 (moderate): Secondary | ICD-10-CM | POA: Diagnosis not present

## 2018-12-19 DIAGNOSIS — N400 Enlarged prostate without lower urinary tract symptoms: Secondary | ICD-10-CM | POA: Diagnosis not present

## 2018-12-19 DIAGNOSIS — E039 Hypothyroidism, unspecified: Secondary | ICD-10-CM | POA: Diagnosis not present

## 2018-12-19 DIAGNOSIS — J449 Chronic obstructive pulmonary disease, unspecified: Secondary | ICD-10-CM | POA: Diagnosis not present

## 2018-12-19 DIAGNOSIS — Z95810 Presence of automatic (implantable) cardiac defibrillator: Secondary | ICD-10-CM

## 2018-12-19 DIAGNOSIS — I25118 Atherosclerotic heart disease of native coronary artery with other forms of angina pectoris: Secondary | ICD-10-CM | POA: Diagnosis not present

## 2018-12-19 DIAGNOSIS — I5022 Chronic systolic (congestive) heart failure: Secondary | ICD-10-CM

## 2018-12-19 DIAGNOSIS — I5023 Acute on chronic systolic (congestive) heart failure: Secondary | ICD-10-CM | POA: Diagnosis not present

## 2018-12-19 DIAGNOSIS — E1122 Type 2 diabetes mellitus with diabetic chronic kidney disease: Secondary | ICD-10-CM | POA: Diagnosis not present

## 2018-12-19 DIAGNOSIS — E109 Type 1 diabetes mellitus without complications: Secondary | ICD-10-CM | POA: Diagnosis not present

## 2018-12-19 DIAGNOSIS — E1142 Type 2 diabetes mellitus with diabetic polyneuropathy: Secondary | ICD-10-CM | POA: Diagnosis not present

## 2018-12-19 DIAGNOSIS — I480 Paroxysmal atrial fibrillation: Secondary | ICD-10-CM | POA: Diagnosis not present

## 2018-12-19 DIAGNOSIS — J441 Chronic obstructive pulmonary disease with (acute) exacerbation: Secondary | ICD-10-CM | POA: Diagnosis not present

## 2018-12-20 ENCOUNTER — Telehealth: Payer: Self-pay

## 2018-12-20 NOTE — Telephone Encounter (Signed)
Remote ICM transmission received.  Attempted call to patient/wife regarding ICM remote transmission and left detailed message, per DPR, with next ICM remote transmission date of 01/22/2019.  Advised to return call for any fluid symptoms or questions.

## 2018-12-20 NOTE — Progress Notes (Signed)
EPIC Encounter for ICM Monitoring  Patient Name: [name] is a 85 y.o. male Date: 12/20/2018 Primary Care Physican: [name], MD Primary [place] Electrophysiologist: Allred BiV Pacing:98% Last Weight:189 lbs Today's Weight:unknown   Clinical Status (16-Nov-2018 to 19-Dec-2018)  Treated VT/VF 0 episodes   AT/AF 1 episode   Time in AT/AF <0.1 hr/day (0.3%)  Longest AT/AF 3 hours  Observations (1) (16-Nov-2018 to 19-Dec-2018)  Patient Activity less than 1 hr/day for 4 weeks.        Attempted call to wife/patient and unable to reach.  Left detailed message per DPR regarding transmission. Transmission reviewed.   Thoracic impedancenormal   Prescribed:Furosemide40 mg was increased to 1 tablet twice a day by PCP Dr Laurann Montana 10/12/2018.  Recommendations: Left voice mail with ICM number and encouraged to call if experiencing any fluid symptoms.  Follow-up plan: ICM clinic phone appointment on3/30/2020.    Copy of ICM check sent to Dr. Rayann Heman.   3 month ICM trend: 12/19/2018    1 Year ICM trend:       Rosalene Billings, RN 12/20/2018 8:22 AM

## 2019-01-01 ENCOUNTER — Ambulatory Visit (INDEPENDENT_AMBULATORY_CARE_PROVIDER_SITE_OTHER): Payer: HMO

## 2019-01-01 DIAGNOSIS — Z7901 Long term (current) use of anticoagulants: Secondary | ICD-10-CM

## 2019-01-01 DIAGNOSIS — I4819 Other persistent atrial fibrillation: Secondary | ICD-10-CM | POA: Diagnosis not present

## 2019-01-01 DIAGNOSIS — Z5181 Encounter for therapeutic drug level monitoring: Secondary | ICD-10-CM

## 2019-01-01 DIAGNOSIS — I480 Paroxysmal atrial fibrillation: Secondary | ICD-10-CM | POA: Diagnosis not present

## 2019-01-01 LAB — POCT INR: INR: 1.7 — AB (ref 2.0–3.0)

## 2019-01-01 NOTE — Patient Instructions (Signed)
Please take 2 tablets tonight, then continue dosage 1 TABLET EVERY DAY.    Recheck in 3 weeks in [place].

## 2019-01-02 ENCOUNTER — Other Ambulatory Visit: Payer: Self-pay

## 2019-01-02 DIAGNOSIS — M79671 Pain in right foot: Secondary | ICD-10-CM | POA: Diagnosis not present

## 2019-01-02 NOTE — Patient Outreach (Signed)
  [facility] [facility]) Care Management Chronic Special Needs Program  01/02/2019  Name: [name] DOB: Jan 17, 1934  MRN: [id number]  [name] is enrolled in a Chronic Special Needs Plan. RNCM called to review Health Risk assessment and complete individualized care plan. No answer. HIPPA compliant message left.   Plan: Chronic care management coordinator will attempt outreach in 2-3 business days.  Thea Silversmith, RN, MSN, [place] [phone]

## 2019-01-03 ENCOUNTER — Other Ambulatory Visit: Payer: Self-pay

## 2019-01-03 ENCOUNTER — Other Ambulatory Visit: Payer: Self-pay | Admitting: Pharmacist

## 2019-01-03 NOTE — Patient Outreach (Signed)
  [place] [facility]) Care Management Chronic Special Needs Program  01/03/2019  Name: [name] DOB: Oct 17, 1934  MRN: [id number]  [name] is enrolled in a chronic special needs plan for Heart Failure. Chronic Care Management Coordinator telephoned client to review health risk assessment and to develop individualized care plan.  Introduced the chronic care management program, importance of client participation, and taking their care plan to all provider appointments and inpatient facilities.  Reviewed the transition of care process and possible referral to community care management.  Subjective: RNCM spoke with client who refers call to his wife. Mrs. [name] reports client went to primary care yesterday regarding foot pain and was started on gabapentin for nerve pain. She reports client with a history of heart failure, diabetes, COPD, atrial fibrilation, heart attack, hypertension and stroke.  Goals Addressed            This Visit's Progress   . Client understands the importance of follow-up with providers by attending scheduled visits      . Client will not report change from baseline and no repeated symptoms of stroke with in the next 6-9 months       . Client will report no fall or injuries in the next 6-9 months.      . Client will report no worsening of symptoms of Atrial Fibrillation within the next  6-9 months      . Client will verbalize knowledge of diabetes self-management as evidenced by Hgb A1C <7 or as defined by provider.       Diabetes self management actions:  Glucose monitoring per provider recommendations  Perform Quality checks on blood meter  Eat Healthy  Check feet daily  Visit provider every 3-6 months as directed  Hbg A1C level every 3-6 months.  Eye Exam yearly    . Maintain timely refills of Heart Failure medication as prescribed within the year       . Obtain annual  Lipid Profile, LDL-C      . Visit Primary Care  Provider or Cardiologist at least 2 times per year      . Will report improvement in foot pain within the next 3-6 months.         Plan:  Send successful outreach letter with a copy of their individualized care plan, Send individual care plan to provider and Send educational material Chronic care management coordination will outreach in 5-6 months. Will refer client to:  Pharmacy for medication review, client has had problems affording medication in the past.   Thea Silversmith, RN, MSN, [place] [phone]

## 2019-01-03 NOTE — Patient Outreach (Signed)
[facility]) Care Management  [place]   01/03/2019  LENNELL SHANKS Sep 13, 1934 [id number]  Reason for referral: Medication Assistance Referral source: [facility] RN with HTA C-SNP Current insurance:[employer]  PMHx includes but not limited to:  T2DM, COPD, atrial fibrillation on warfarin, CAD, CHF (EF 30-35% 11/'19), HTN, stroke  Outreach:  Successful telephone call with Lynett Grimes.  HIPAA identifiers verified.   Subjective:  Spouse agreeable to review Mr. Winchell medications.  Reports he uses a weekly pillbox AM/PM.  Denies medication questions or concerns at this time. Reports patient sees a pharmacist at PCP office, Tammy Eckard.    Objective: Lab Results  Component Value Date   CREATININE 1.33 (H) 10/06/2018   CREATININE 1.50 (H) 10/05/2018   CREATININE 1.45 (H) 10/04/2018    Lab Results  Component Value Date   HGBA1C 6.5 (H) 09/20/2018    Lipid Panel     Component Value Date/Time   CHOL 137 11/12/2014 0921   TRIG 95 11/12/2014 0921   HDL 50 11/12/2014 0921   CHOLHDL 2.6 11/29/2008 0330   VLDL 10 11/29/2008 0330   LDLCALC 68 11/12/2014 0921    BP Readings from Last 3 Encounters:  10/07/18 119/71  10/02/18 122/64  09/20/18 120/61    Allergies  Allergen Reactions  . Aldactone [Spironolactone] Other (See Comments)    Hyperkalemia  reported by Dr. [name] - pt is currently taking 12.5 mg daily -November 2017 per medication list by same MD    Medications Reviewed Today    Reviewed by Gentry Roch, CPhT (Pharmacy Technician) on 10/04/18 at 1009  Med List Status: Complete  Medication Order Taking? Sig Documenting Provider Last Dose Status Informant  albuterol (PROVENTIL HFA;VENTOLIN HFA) 108 (90 BASE) MCG/ACT inhaler [id number] Yes Inhale 2 puffs into the lungs every 6 (six) hours as needed for wheezing or shortness of breath. Must keep June 2016 appt. Chesley Mires, MD Past Week prn Active Spouse/Significant  Other           Med Note Davis Gourd, CHASITIE R   Thu Apr 21, 2017  8:15 AM)    albuterol (PROVENTIL) (2.5 MG/3ML) 0.083% nebulizer solution [id number] Yes USE 1 VIAL VIA NEBULIZER 3 TIMES A DAY AS DIRECTED. DX:J44.9  Patient taking differently:  Take 2.5 mg by nebulization daily.    Chesley Mires, MD 10/03/2018 prn Active Spouse/Significant Other  amiodarone (PACERONE) 400 MG tablet [id number] Yes TAKE HALF TABLET (200 MG TOTAL) BY MOUTH DAILY.  Patient taking differently:  Take 200 mg by mouth every morning.    Thompson Grayer, MD 10/03/2018 Unknown time Active Spouse/Significant Other  atorvastatin (LIPITOR) 80 MG tablet [id number] Yes TAKE 1 TABLET (80 MG TOTAL) BY MOUTH DAILY.  Patient taking differently:  Take 80 mg by mouth every morning.    Jettie Booze, MD 10/03/2018 Unknown time Active Spouse/Significant Other  bisacodyl (DULCOLAX) 5 MG EC tablet [id number] Yes Take 2 tablets (10 mg total) by mouth daily at 12 noon.  Patient taking differently:  Take 10 mg by mouth daily as needed for mild constipation.    Regalado, Belkys A, MD Past Month prn Active Spouse/Significant Other  carvedilol (COREG) 6.25 MG tablet [id number] Yes Take 0.5 tablets (3.125 mg total) by mouth 2 (two) times daily. Take if systolic blood pressure >646  Patient taking differently:  Take 3.125 mg by mouth See admin instructions. Take if systolic blood pressure >803 take 3.125 mg and if below he does not take any thing  Jettie Booze, MD 10/03/2018 0600 Active Spouse/Significant Other  cholecalciferol (VITAMIN D) 1000 units tablet [id number] Yes Take 2,000 Units by mouth every evening. [provider] 10/03/2018 Unknown time Active Spouse/Significant Other  dextromethorphan-guaiFENesin (MUCINEX DM) 30-600 MG 12hr tablet [id number] Yes Take 1 tablet by mouth 2 (two) times daily as needed for cough. [provider] Past Month prn Active Spouse/Significant Other  ferrous sulfate 325 (65 FE) MG  tablet [id number] Yes Take 325 mg by mouth every evening.  [provider] 10/03/2018 Unknown time Active Spouse/Significant Other  fluticasone (FLONASE) 50 MCG/ACT nasal spray [id number] Yes SPRAY 2 SPRAYS INTO EACH NOSTRIL EVERY DAY  Patient taking differently:  Place 2 sprays into both nostrils every morning.    Chesley Mires, MD 10/04/2018 Unknown time Active Spouse/Significant Other  furosemide (LASIX) 40 MG tablet [id number] Yes Take 1 tablet (40 mg total) by mouth daily.  Patient taking differently:  Take 40 mg by mouth at bedtime.    Jettie Booze, MD 10/03/2018 Unknown time Active Spouse/Significant Other  Insulin Degludec-Liraglutide (XULTOPHY) 100-3.6 UNIT-MG/ML SOPN [id number] Yes Inject 16 Units into the skin every morning. [provider] 10/03/2018 Unknown time Active Spouse/Significant Other  levothyroxine (SYNTHROID, LEVOTHROID) 100 MCG tablet [id number] Yes Take 100 mcg by mouth daily before breakfast.  [provider] 10/04/2018 0745 Active Spouse/Significant Other           Med Note Belva Agee   Sat Jan 10, 2016 10:52 PM)    Multiple Vitamins-Minerals (PRESERVISION AREDS PO) [id number] Yes Take 1 tablet by mouth 2 (two) times daily. [provider] 10/03/2018 Unknown time Active Spouse/Significant Other  potassium chloride 20 MEQ TBCR [id number] Yes Take 20 mEq by mouth daily. Thurnell Lose, MD 10/03/2018 Unknown time Active Spouse/Significant Other  PRESCRIPTION MEDICATION [id number] Yes Inhale into the lungs at bedtime. CPAP [provider] 10/03/2018 Unknown time Active Spouse/Significant Other  senna (SENOKOT) 8.6 MG tablet [id number] Yes Take 1 tablet by mouth 2 (two) times daily.  [provider] 10/04/2018 Unknown time Active Spouse/Significant Other  traMADol (ULTRAM) 50 MG tablet [id number] Yes Take 50 mg by mouth 3 (three) times daily as needed for moderate pain.  [provider] unknown prn Active  Spouse/Significant Other  TRELEGY ELLIPTA 100-62.5-25 MCG/INH AEPB [id number] Yes INHALE 1 PUFF INTO LUNGS ONCE A DAY  Patient taking differently:  Take 1 puff by mouth every morning.    Chesley Mires, MD 10/03/2018 Unknown time Active Spouse/Significant Other  vitamin B-12 (CYANOCOBALAMIN) 1000 MCG tablet [id number] Yes Take 1,000 mcg by mouth daily. [provider] 10/03/2018 Unknown time Active Spouse/Significant Other  warfarin (COUMADIN) 5 MG tablet [id number] Yes TAKE AS DIRECTED BY COUMADIN CLINIC  Patient taking differently:  Take 2.5-5 mg by mouth See admin instructions. Take 1/2 tablet (2.5mg ) by mouth in the evenings on Wed and Sat. Take 1 tablet (5mg ) all other days in the evening   Jettie Booze, MD 10/03/2018 Unknown time Active Spouse/Significant Other           Med Note Dina Rich, JEANNETTA   Wed Oct 04, 2018 10:07 AM) Took 2.5 mg          Assessment:  Drugs sorted by system:  Neurologic/Psychologic: gabapentin  Cardiovascular: amiodarone, atorvastatin, carvedilol, furosemide  Pulmonary/Allergy: albuterol INH + neb, fluticasone NS, dextromethorphan-guaifenesin  Gastrointestinal: bisacodyl, sennakot, feeding supplement, Trelegy Ellipta  Endocrine: insulin degludec-liraglutide, levothyroxine  Pain: tramadol  Vitamins/Minerals/Supplements: cholecalciferol, ferrous sulfate, MVI, potassium, vitamin B-12  Hematologic: Warfarin  Medication Assistance Findings:  No medication assistance needs identified  Not eligible for Extra Help.  Discussed patient assistance options for insulin and inhaler for when patient goes into the coverage gap.  Spouse voiced understanding.  She will contact pharmacist with [facility] at PCP office with further questions about medication assistance.   Plan: Will close [facility] pharmacy case as no further medication needs identified at this time.  Am happy to assist in the future as needed.    Ralene Bathe, PharmD, [place] [phone]

## 2019-01-03 NOTE — Addendum Note (Signed)
Addended by: Luretha Rued on: 01/03/2019 12:48 PM   Modules accepted: Orders

## 2019-01-08 ENCOUNTER — Encounter (HOSPITAL_COMMUNITY): Payer: Self-pay | Admitting: Emergency Medicine

## 2019-01-08 ENCOUNTER — Other Ambulatory Visit: Payer: Self-pay

## 2019-01-08 ENCOUNTER — Inpatient Hospital Stay (HOSPITAL_COMMUNITY)
Admission: EM | Admit: 2019-01-08 | Discharge: 2019-01-14 | DRG: 291 | Disposition: A | Discharge status: Home / Self Care | Payer: HMO | Attending: Family Medicine | Admitting: Family Medicine

## 2019-01-08 ENCOUNTER — Emergency Department (HOSPITAL_COMMUNITY): Payer: HMO

## 2019-01-08 DIAGNOSIS — I2510 Atherosclerotic heart disease of native coronary artery without angina pectoris: Secondary | ICD-10-CM | POA: Diagnosis present

## 2019-01-08 DIAGNOSIS — N400 Enlarged prostate without lower urinary tract symptoms: Secondary | ICD-10-CM | POA: Diagnosis present

## 2019-01-08 DIAGNOSIS — J9811 Atelectasis: Secondary | ICD-10-CM | POA: Diagnosis not present

## 2019-01-08 DIAGNOSIS — E785 Hyperlipidemia, unspecified: Secondary | ICD-10-CM | POA: Diagnosis not present

## 2019-01-08 DIAGNOSIS — Z794 Long term (current) use of insulin: Secondary | ICD-10-CM

## 2019-01-08 DIAGNOSIS — I25119 Atherosclerotic heart disease of native coronary artery with unspecified angina pectoris: Secondary | ICD-10-CM | POA: Diagnosis not present

## 2019-01-08 DIAGNOSIS — I255 Ischemic cardiomyopathy: Secondary | ICD-10-CM | POA: Diagnosis present

## 2019-01-08 DIAGNOSIS — Z951 Presence of aortocoronary bypass graft: Secondary | ICD-10-CM

## 2019-01-08 DIAGNOSIS — M545 Low back pain: Secondary | ICD-10-CM | POA: Diagnosis not present

## 2019-01-08 DIAGNOSIS — Z8249 Family history of ischemic heart disease and other diseases of the circulatory system: Secondary | ICD-10-CM

## 2019-01-08 DIAGNOSIS — I420 Dilated cardiomyopathy: Secondary | ICD-10-CM | POA: Diagnosis not present

## 2019-01-08 DIAGNOSIS — Z79899 Other long term (current) drug therapy: Secondary | ICD-10-CM

## 2019-01-08 DIAGNOSIS — E1122 Type 2 diabetes mellitus with diabetic chronic kidney disease: Secondary | ICD-10-CM | POA: Diagnosis not present

## 2019-01-08 DIAGNOSIS — R0902 Hypoxemia: Secondary | ICD-10-CM | POA: Diagnosis not present

## 2019-01-08 DIAGNOSIS — I5023 Acute on chronic systolic (congestive) heart failure: Secondary | ICD-10-CM | POA: Diagnosis present

## 2019-01-08 DIAGNOSIS — D72829 Elevated white blood cell count, unspecified: Secondary | ICD-10-CM

## 2019-01-08 DIAGNOSIS — Z7901 Long term (current) use of anticoagulants: Secondary | ICD-10-CM

## 2019-01-08 DIAGNOSIS — I10 Essential (primary) hypertension: Secondary | ICD-10-CM | POA: Diagnosis not present

## 2019-01-08 DIAGNOSIS — I509 Heart failure, unspecified: Secondary | ICD-10-CM

## 2019-01-08 DIAGNOSIS — Y712 Prosthetic and other implants, materials and accessory cardiovascular devices associated with adverse incidents: Secondary | ICD-10-CM | POA: Diagnosis present

## 2019-01-08 DIAGNOSIS — K219 Gastro-esophageal reflux disease without esophagitis: Secondary | ICD-10-CM | POA: Diagnosis present

## 2019-01-08 DIAGNOSIS — Z87730 Personal history of (corrected) cleft lip and palate: Secondary | ICD-10-CM

## 2019-01-08 DIAGNOSIS — E1151 Type 2 diabetes mellitus with diabetic peripheral angiopathy without gangrene: Secondary | ICD-10-CM | POA: Diagnosis not present

## 2019-01-08 DIAGNOSIS — E1140 Type 2 diabetes mellitus with diabetic neuropathy, unspecified: Secondary | ICD-10-CM | POA: Diagnosis not present

## 2019-01-08 DIAGNOSIS — L409 Psoriasis, unspecified: Secondary | ICD-10-CM | POA: Diagnosis present

## 2019-01-08 DIAGNOSIS — J439 Emphysema, unspecified: Secondary | ICD-10-CM | POA: Diagnosis present

## 2019-01-08 DIAGNOSIS — I130 Hypertensive heart and chronic kidney disease with heart failure and stage 1 through stage 4 chronic kidney disease, or unspecified chronic kidney disease: Principal | ICD-10-CM | POA: Diagnosis present

## 2019-01-08 DIAGNOSIS — T82110S Breakdown (mechanical) of cardiac electrode, sequela: Secondary | ICD-10-CM | POA: Diagnosis not present

## 2019-01-08 DIAGNOSIS — E860 Dehydration: Secondary | ICD-10-CM | POA: Diagnosis not present

## 2019-01-08 DIAGNOSIS — Z95810 Presence of automatic (implantable) cardiac defibrillator: Secondary | ICD-10-CM | POA: Diagnosis present

## 2019-01-08 DIAGNOSIS — I517 Cardiomegaly: Secondary | ICD-10-CM | POA: Diagnosis not present

## 2019-01-08 DIAGNOSIS — J90 Pleural effusion, not elsewhere classified: Secondary | ICD-10-CM | POA: Diagnosis not present

## 2019-01-08 DIAGNOSIS — Z66 Do not resuscitate: Secondary | ICD-10-CM | POA: Diagnosis present

## 2019-01-08 DIAGNOSIS — T82190S Other mechanical complication of cardiac electrode, sequela: Secondary | ICD-10-CM

## 2019-01-08 DIAGNOSIS — D509 Iron deficiency anemia, unspecified: Secondary | ICD-10-CM | POA: Diagnosis not present

## 2019-01-08 DIAGNOSIS — I480 Paroxysmal atrial fibrillation: Secondary | ICD-10-CM | POA: Diagnosis not present

## 2019-01-08 DIAGNOSIS — Z8673 Personal history of transient ischemic attack (TIA), and cerebral infarction without residual deficits: Secondary | ICD-10-CM

## 2019-01-08 DIAGNOSIS — I252 Old myocardial infarction: Secondary | ICD-10-CM

## 2019-01-08 DIAGNOSIS — G4733 Obstructive sleep apnea (adult) (pediatric): Secondary | ICD-10-CM | POA: Diagnosis present

## 2019-01-08 DIAGNOSIS — Z6830 Body mass index (BMI) 30.0-30.9, adult: Secondary | ICD-10-CM

## 2019-01-08 DIAGNOSIS — R531 Weakness: Secondary | ICD-10-CM | POA: Diagnosis not present

## 2019-01-08 DIAGNOSIS — N183 Chronic kidney disease, stage 3 unspecified: Secondary | ICD-10-CM | POA: Diagnosis present

## 2019-01-08 DIAGNOSIS — E039 Hypothyroidism, unspecified: Secondary | ICD-10-CM | POA: Diagnosis present

## 2019-01-08 DIAGNOSIS — Z79891 Long term (current) use of opiate analgesic: Secondary | ICD-10-CM

## 2019-01-08 DIAGNOSIS — Z823 Family history of stroke: Secondary | ICD-10-CM

## 2019-01-08 DIAGNOSIS — G4730 Sleep apnea, unspecified: Secondary | ICD-10-CM | POA: Diagnosis not present

## 2019-01-08 DIAGNOSIS — Z87891 Personal history of nicotine dependence: Secondary | ICD-10-CM

## 2019-01-08 DIAGNOSIS — I5022 Chronic systolic (congestive) heart failure: Secondary | ICD-10-CM | POA: Diagnosis not present

## 2019-01-08 DIAGNOSIS — W19XXXA Unspecified fall, initial encounter: Secondary | ICD-10-CM | POA: Diagnosis not present

## 2019-01-08 DIAGNOSIS — I959 Hypotension, unspecified: Secondary | ICD-10-CM | POA: Diagnosis present

## 2019-01-08 DIAGNOSIS — R05 Cough: Secondary | ICD-10-CM | POA: Diagnosis not present

## 2019-01-08 DIAGNOSIS — M810 Age-related osteoporosis without current pathological fracture: Secondary | ICD-10-CM | POA: Diagnosis present

## 2019-01-08 DIAGNOSIS — R55 Syncope and collapse: Secondary | ICD-10-CM | POA: Diagnosis not present

## 2019-01-08 DIAGNOSIS — R0600 Dyspnea, unspecified: Secondary | ICD-10-CM

## 2019-01-08 DIAGNOSIS — Z825 Family history of asthma and other chronic lower respiratory diseases: Secondary | ICD-10-CM

## 2019-01-08 DIAGNOSIS — R0602 Shortness of breath: Secondary | ICD-10-CM | POA: Diagnosis not present

## 2019-01-08 LAB — COMPREHENSIVE METABOLIC PANEL
ALT: 16 U/L (ref 0–44)
AST: 32 U/L (ref 15–41)
Albumin: 2.9 g/dL — ABNORMAL LOW (ref 3.5–5.0)
Alkaline Phosphatase: 77 U/L (ref 38–126)
Anion gap: 9 (ref 5–15)
BUN: 24 mg/dL — ABNORMAL HIGH (ref 8–23)
CO2: 28 mmol/L (ref 22–32)
Calcium: 9 mg/dL (ref 8.9–10.3)
Chloride: 101 mmol/L (ref 98–111)
Creatinine, Ser: 1.44 mg/dL — ABNORMAL HIGH (ref 0.61–1.24)
GFR calc Af Amer: 52 mL/min — ABNORMAL LOW (ref >60)
GFR calc non Af Amer: 45 mL/min — ABNORMAL LOW (ref >60)
Glucose, Bld: 173 mg/dL — ABNORMAL HIGH (ref 70–99)
Potassium: 4.2 mmol/L (ref 3.5–5.1)
Sodium: 138 mmol/L (ref 135–145)
Total Bilirubin: 0.6 mg/dL (ref 0.3–1.2)
Total Protein: 6.2 g/dL — ABNORMAL LOW (ref 6.5–8.1)

## 2019-01-08 LAB — CBC WITH DIFFERENTIAL/PLATELET
Abs Immature Granulocytes: 0.35 10*3/uL — ABNORMAL HIGH (ref 0.00–0.07)
Basophils Absolute: 0.1 10*3/uL (ref 0.0–0.1)
Basophils Relative: 0 %
Eosinophils Absolute: 0 10*3/uL (ref 0.0–0.5)
Eosinophils Relative: 0 %
HCT: 39.3 % (ref 39.0–52.0)
Hemoglobin: 12.2 g/dL — ABNORMAL LOW (ref 13.0–17.0)
Immature Granulocytes: 1 %
Lymphocytes Relative: 3 %
Lymphs Abs: 0.7 10*3/uL (ref 0.7–4.0)
MCH: 29.3 pg (ref 26.0–34.0)
MCHC: 31 g/dL (ref 30.0–36.0)
MCV: 94.2 fL (ref 80.0–100.0)
Monocytes Absolute: 0.8 10*3/uL (ref 0.1–1.0)
Monocytes Relative: 3 %
Neutro Abs: 22.5 10*3/uL — ABNORMAL HIGH (ref 1.7–7.7)
Neutrophils Relative %: 93 %
Platelets: 222 10*3/uL (ref 150–400)
RBC: 4.17 MIL/uL — ABNORMAL LOW (ref 4.22–5.81)
RDW: 13.8 % (ref 11.5–15.5)
WBC: 24.5 10*3/uL — ABNORMAL HIGH (ref 4.0–10.5)
nRBC: 0 % (ref 0.0–0.2)

## 2019-01-08 LAB — URINALYSIS, ROUTINE W REFLEX MICROSCOPIC
Bilirubin Urine: NEGATIVE
Glucose, UA: NEGATIVE mg/dL
Hgb urine dipstick: NEGATIVE
Ketones, ur: NEGATIVE mg/dL
Leukocytes,Ua: NEGATIVE
Nitrite: NEGATIVE
Protein, ur: NEGATIVE mg/dL
Specific Gravity, Urine: 1.01 (ref 1.005–1.030)
pH: 5 (ref 5.0–8.0)

## 2019-01-08 LAB — LACTIC ACID, PLASMA
Lactic Acid, Venous: 2 mmol/L (ref 0.5–1.9)
Lactic Acid, Venous: 2.1 mmol/L (ref 0.5–1.9)

## 2019-01-08 LAB — PROTIME-INR
INR: 2.9 — ABNORMAL HIGH (ref 0.8–1.2)
Prothrombin Time: 30.1 seconds — ABNORMAL HIGH (ref 11.4–15.2)

## 2019-01-08 LAB — PROCALCITONIN: Procalcitonin: 2.14 ng/mL

## 2019-01-08 LAB — BRAIN NATRIURETIC PEPTIDE: B Natriuretic Peptide: 638.9 pg/mL — ABNORMAL HIGH (ref 0.0–100.0)

## 2019-01-08 LAB — GLUCOSE, CAPILLARY: Glucose-Capillary: 146 mg/dL — ABNORMAL HIGH (ref 70–99)

## 2019-01-08 MED ORDER — OXYCODONE HCL 5 MG PO TABS: 5.0000 mg | ORAL_TABLET | ORAL | Status: DC | PRN

## 2019-01-08 MED ORDER — ACETAMINOPHEN 325 MG PO TABS
650.0000 mg | ORAL_TABLET | Freq: Four times a day (QID) | ORAL | Status: DC | PRN
  Administered 2019-01-12 – 2019-01-13 (×2): 650 mg via ORAL
  Filled 2019-01-08: qty 2

## 2019-01-08 MED ORDER — SODIUM CHLORIDE 0.9 % IV SOLN
INTRAVENOUS | Status: DC
  Administered 2019-01-08: 13:00:00 via INTRAVENOUS

## 2019-01-08 MED ORDER — SODIUM CHLORIDE 0.9 % IV SOLN
1.0000 g | Freq: Once | INTRAVENOUS | Status: AC
  Administered 2019-01-08: 1 g via INTRAVENOUS
  Filled 2019-01-08: qty 10

## 2019-01-08 MED ORDER — SODIUM CHLORIDE 0.9 % IV BOLUS
1000.0000 mL | Freq: Once | INTRAVENOUS | Status: AC
  Administered 2019-01-08: 1000 mL via INTRAVENOUS

## 2019-01-08 MED ORDER — ONDANSETRON HCL 4 MG PO TABS: 4.0000 mg | ORAL_TABLET | Freq: Four times a day (QID) | ORAL | Status: DC | PRN

## 2019-01-08 MED ORDER — ONDANSETRON HCL 4 MG/2ML IJ SOLN: 4.0000 mg | Freq: Four times a day (QID) | INTRAMUSCULAR | Status: DC | PRN

## 2019-01-08 MED ORDER — SODIUM CHLORIDE 0.9 % IV SOLN
500.0000 mg | Freq: Once | INTRAVENOUS | Status: AC
  Administered 2019-01-08: 500 mg via INTRAVENOUS
  Filled 2019-01-08: qty 500

## 2019-01-08 MED ORDER — ACETAMINOPHEN 650 MG RE SUPP: 650.0000 mg | Freq: Four times a day (QID) | RECTAL | Status: DC | PRN

## 2019-01-08 MED ORDER — SODIUM CHLORIDE 0.9% FLUSH
3.0000 mL | Freq: Two times a day (BID) | INTRAVENOUS | Status: DC
  Administered 2019-01-08 – 2019-01-14 (×12): 3 mL via INTRAVENOUS

## 2019-01-08 MED ORDER — INSULIN ASPART 100 UNIT/ML ~~LOC~~ SOLN
0.0000 [IU] | Freq: Three times a day (TID) | SUBCUTANEOUS | Status: DC
  Administered 2019-01-09: 2 [IU] via SUBCUTANEOUS
  Administered 2019-01-10: 3 [IU] via SUBCUTANEOUS
  Administered 2019-01-10 – 2019-01-11 (×2): 2 [IU] via SUBCUTANEOUS
  Administered 2019-01-11: 3 [IU] via SUBCUTANEOUS
  Administered 2019-01-11 – 2019-01-12 (×2): 5 [IU] via SUBCUTANEOUS
  Administered 2019-01-12: 3 [IU] via SUBCUTANEOUS
  Administered 2019-01-12: 2 [IU] via SUBCUTANEOUS

## 2019-01-08 NOTE — H&P (Signed)
History and Physical    [name] HGD:[id number] DOB: 17-Jul-1934 DOA: 01/08/2019  PCP: [name], MD  Patient coming from: Home  I have personally briefly reviewed patient's old medical records in [place]  Chief Complaint: Near syncope  HPI: [name] is a 85 y.o. male with medical history significant of atrial fibrillation on warfarin, coronary artery disease, congestive heart failure systolic with an EF of 35 to 40%, ischemic dilated cardiomyopathy, obstructive sleep apnea, COPD, and hypothyroidism who presents emergency department today with near syncope.  He did not sleep well last night.  He was in the recliner had a hard time getting warm he felt like he had the chills.  His wife woke him up around 930 this morning and he had a hard time getting out of the recliner.  As he was walking to the bathroom he felt dizzy and like he was going to pass out.  He was able to get to the bed and did not fall or hurt himself.  He did not have loss of consciousness.  Patient's blood pressure and heart rate were low when EMS picked him up.  They gave him fluids and his blood pressure has improved.  He did not take any of his medications this morning.  He denies pain other than his usual low back pain. ED Course: 2000 mL from EMS in addition to no 300 mL in the emergency department.  Blood pressure is now stabilizing.  UA negative, chest x-ray consistent with pulmonary edema and improving right upper lobe infiltrate compared to December, maker in place, AICD has been turned off since last fall.  Review of Systems: As per HPI otherwise all other systems reviewed and  negative.    Past Medical History:  Diagnosis Date   Atrial fibrillation ([place])    persistent, previously seen at [facility] and placed on amiodarone   Benign prostatic hypertrophy    CAD (coronary artery disease)    multivessel s/p inferolateral wall MI with subsequent CABG 11/1998.  Cath 2009 with Patent grafts    Cleft palate    COPD with emphysema ([place]) 04/01/2010   DM (diabetes mellitus), type 2 (HCC)    Dyspnea    GERD (gastroesophageal reflux disease)    HTN (hypertension)    Hyperlipidemia    Hypothyroidism    Iron deficiency anemia    Ischemic dilated cardiomyopathy (HCC)    EF 35-40% by MUGA 6/11   Myocardial infarction ([place])    Nasal septal deviation    Nephrolithiasis    OSA (obstructive sleep apnea)    PAF (paroxysmal atrial fibrillation) (HCC)    Peripheral arterial disease (HCC)    left subclavian artery stenosis   PNA (pneumonia)    Psoriasis    Seborrheic keratosis    Stroke ([place])    Systolic congestive heart failure ([place]) 2009   s/p BiV ICD implantation by Dr Leonia Reeves (MDT)    Past Surgical History:  Procedure Laterality Date   BI-VENTRICULAR IMPLANTABLE CARDIOVERTER DEFIBRILLATOR  (CRT-D)  10-08-08; 11-06-2013   Dr Leonia Reeves (MDT) implant for primary prevention; gen change to MDT VivaXT CRTD by Dr Rayann Heman   BIV ICD [facility] CHANGE OUT N/A 11/06/2013   Procedure: BIV ICD GENERTAOR CHANGE OUT;  Surgeon: Coralyn Mark, MD;  Location: [facility] CATH LAB;  Service: Cardiovascular;  Laterality: N/A;   c-spine surgery     CARDIOVERSION N/A 04/29/2016   Procedure: CARDIOVERSION;  Surgeon: Pixie Casino, MD;  Location: [facility];  Service:  Cardiovascular;  Laterality: N/A;   CARPAL TUNNEL RELEASE     CATARACT EXTRACTION     CORONARY ARTERY BYPASS GRAFT     LIMA to LAD, SVG to OM, SVG to diagonal   ELECTROPHYSIOLOGIC STUDY N/A 06/11/2016   Procedure: Atrial Fibrillation Ablation;  Surgeon: Thompson Grayer, MD;  Location: [facility] CV LAB;  Service: Cardiovascular;  Laterality: N/A;   left cleft palate and left cleft lip repair      Social History   Social History Narrative   Lives [place]   Retired     reports that he quit smoking about 20 years ago. His smoking use included cigarettes. He has a 195.00 pack-year smoking history. He has never used  smokeless tobacco. He reports that he does not drink alcohol or use drugs.  Allergies  Allergen Reactions   Aldactone [Spironolactone] Other (See Comments)    Hyperkalemia  reported by Dr. [name] - pt is currently taking 12.5 mg daily -November 2017 per medication list by same MD    Family History  Problem Relation Age of Onset   Asthma Father    Stroke Father    Hypertension Father    Hypertension Mother    Heart disease Brother    Heart attack Brother    Hypertension Sister    Hypertension Brother     Prior to Admission medications   Medication Sig Start Date End Date Taking? Authorizing Provider  albuterol (PROVENTIL HFA;VENTOLIN HFA) 108 (90 BASE) MCG/ACT inhaler Inhale 2 puffs into the lungs every 6 (six) hours as needed for wheezing or shortness of breath. Must keep June 2016 appt. 03/04/15   Chesley Mires, MD  albuterol (PROVENTIL) (2.5 MG/3ML) 0.083% nebulizer solution USE 1 VIAL VIA NEBULIZER 3 TIMES A DAY AS DIRECTED. DX:J44.9 Patient taking differently: Take 2.5 mg by nebulization daily.  08/21/18   Chesley Mires, MD  amiodarone (PACERONE) 400 MG tablet TAKE HALF TABLET (200 MG TOTAL) BY MOUTH DAILY. Patient taking differently: Take 200 mg by mouth every morning.  02/21/18   Allred, Jeneen Rinks, MD  atorvastatin (LIPITOR) 80 MG tablet TAKE 1 TABLET (80 MG TOTAL) BY MOUTH DAILY. Patient taking differently: Take 80 mg by mouth every morning.  07/10/18   Jettie Booze, MD  bisacodyl (DULCOLAX) 5 MG EC tablet Take 2 tablets (10 mg total) by mouth daily at 12 noon. Patient taking differently: Take 10 mg by mouth daily as needed for mild constipation.  09/06/16   Regalado, Belkys A, MD  carvedilol (COREG) 6.25 MG tablet Take 0.5 tablets (3.125 mg total) by mouth 2 (two) times daily. Take if systolic blood pressure >790 Patient taking differently: Take 3.125 mg by mouth See admin instructions. Take if systolic blood pressure >240 take 3.125 mg and if below he does not  take any thing 10/02/18   Jettie Booze, MD  cholecalciferol (VITAMIN D) 1000 units tablet Take 2,000 Units by mouth every evening.    [provider]  dextromethorphan-guaiFENesin (MUCINEX DM) 30-600 MG 12hr tablet Take 1 tablet by mouth 2 (two) times daily as needed for cough.    [provider]  feeding supplement, ENSURE ENLIVE, (ENSURE ENLIVE) LIQD Take 237 mLs by mouth 2 (two) times daily between meals. 10/07/18   Ghimire, Henreitta Leber, MD  ferrous sulfate 325 (65 FE) MG tablet Take 325 mg by mouth every evening.     [provider]  fluticasone (FLONASE) 50 MCG/ACT nasal spray SPRAY 2 SPRAYS INTO EACH NOSTRIL EVERY DAY Patient taking  differently: Place 2 sprays into both nostrils every morning.  08/16/18   Chesley Mires, MD  furosemide (LASIX) 40 MG tablet Take 1 tablet (40 mg total) by mouth daily. Patient taking differently: Take 40 mg by mouth 2 (two) times daily. Changed to 40 mg twice a day by Dr Beather Arbour on 10/12/18 10/02/18   Jettie Booze, MD  gabapentin (NEURONTIN) 100 MG capsule Take 100 mg by mouth 3 (three) times daily.    [provider]  Insulin Degludec-Liraglutide (XULTOPHY) 100-3.6 UNIT-MG/ML SOPN Inject 16 Units into the skin every morning.    [provider]  levothyroxine (SYNTHROID, LEVOTHROID) 100 MCG tablet Take 100 mcg by mouth daily before breakfast.  09/23/15   [provider]  Multiple Vitamins-Minerals (PRESERVISION AREDS PO) Take 1 tablet by mouth 2 (two) times daily.    [provider]  potassium chloride 20 MEQ TBCR Take 20 mEq by mouth daily. 07/15/18   Thurnell Lose, MD  PRESCRIPTION MEDICATION Inhale into the lungs at bedtime. CPAP    [provider]  senna (SENOKOT) 8.6 MG tablet Take 1 tablet by mouth 2 (two) times daily.     [provider]  traMADol (ULTRAM) 50 MG tablet Take 50 mg by mouth 3 (three) times daily as needed for moderate pain.  06/07/18   [provider]  TRELEGY ELLIPTA 100-62.5-25 MCG/INH AEPB INHALE 1 PUFF INTO LUNGS ONCE A DAY Patient taking differently: Take 1 puff by mouth every morning.  07/06/18   Chesley Mires, MD  vitamin B-12 (CYANOCOBALAMIN) 1000 MCG tablet Take 1,000 mcg by mouth daily.    [provider]  warfarin (COUMADIN) 5 MG tablet TAKE AS DIRECTED BY COUMADIN CLINIC Patient taking differently: Take 2.5-5 mg by mouth See admin instructions. Take 1/2 tablet (2.5mg ) by mouth in the evenings on Wed and Sat. Take 1 tablet (5mg ) all other days in the evening 03/31/18   Jettie Booze, MD    Physical Exam:  Constitutional: NAD, calm, comfortable Vitals:   01/08/19 1600 01/08/19 1630 01/08/19 1645 01/08/19 1741  BP: (!) 109/54 (!) 120/57 112/64 125/82  Pulse: 69 69 72 78  Resp: 18 17 19 20   Temp:    98.7 F (37.1 C)  TempSrc:    Oral  SpO2: 99% 100% 98% 99%  Weight:    89 kg  Height:    5\' 8"  (1.727 m)   Eyes: PERRL, lids and conjunctivae normal ENMT: Mucous membranes are moist. Posterior pharynx clear of any exudate or lesions.Normal dentition.  Neck: normal, supple, no masses, no thyromegaly Respiratory: Breath sounds bilaterally no rhonchi minimal crackles.  Emily increased respiratory effort. No accessory muscle use.  Cardiovascular: Regular rate and rhythm, no murmurs / rubs / gallops.  Mild pretibial extremity edema. 2+ pedal pulses. No carotid bruits.  Abdomen: no tenderness, no masses palpated. No hepatosplenomegaly. Bowel sounds positive.  Musculoskeletal: no clubbing / cyanosis. No joint deformity upper and lower extremities. Good ROM, no contractures. Normal muscle tone.  Skin: no rashes, lesions, ulcers. No induration Neurologic: CN 2-12 grossly intact. Sensation intact, DTR normal. Strength 5/5 in all 4.  Psychiatric: Normal judgment and insight. Alert and oriented x 3. Normal mood.    Labs on Admission: I have personally reviewed following labs and imaging studies  CBC: Recent  Labs  Lab 01/08/19 1220  WBC 24.5*  NEUTROABS 22.5*  HGB 12.2*  HCT 39.3  MCV 94.2  PLT 921   Basic Metabolic Panel: Recent Labs  Lab 01/08/19 1220  NA 138  K 4.2  CL 101  CO2 28  GLUCOSE 173*  BUN 24*  CREATININE 1.44*  CALCIUM 9.0   Liver Function Tests: Recent Labs  Lab 01/08/19 1220  AST 32  ALT 16  ALKPHOS 77  BILITOT 0.6  PROT 6.2*  ALBUMIN 2.9*   Coagulation Profile: Recent Labs  Lab 01/08/19 1303  INR 2.9*   BNP (last 3 results) Recent Labs    02/27/18 1045 03/06/18 0752 03/13/18 0805  PROBNP 10,884* 12,873* 5,305*   Urine analysis:    Component Value Date/Time   COLORURINE YELLOW 01/08/2019 [place] 01/08/2019 1303   LABSPEC 1.010 01/08/2019 1303   PHURINE 5.0 01/08/2019 1303   GLUCOSEU NEGATIVE 01/08/2019 1303   HGBUR NEGATIVE 01/08/2019 1303   [place] 01/08/2019 1303   KETONESUR NEGATIVE 01/08/2019 1303   PROTEINUR NEGATIVE 01/08/2019 1303   UROBILINOGEN 0.2 11/28/2008 0325   NITRITE NEGATIVE 01/08/2019 1303   LEUKOCYTESUR NEGATIVE 01/08/2019 1303    Radiological Exams on Admission: Dg Chest Port 1 View  Result Date: 01/08/2019 CLINICAL DATA:  Syncope EXAM: PORTABLE CHEST 1 VIEW COMPARISON:  10/04/2018, 09/20/2018 FINDINGS: Cardiac enlargement with changes of CABG and AICD.  Underlying COPD Progression of right pleural effusion and right lower lobe airspace disease. Improvement in right upper lobe infiltrate since the prior study. Pulmonary vascular congestion and probable edema have progressed in the interval. IMPRESSION: Progression of vascular congestion and bilateral airspace disease likely due to pulmonary edema. Progression of right pleural effusion and right lower lobe atelectasis. Improvement in right upper lobe infiltrate which may have been pneumonia on 10/04/2018. Electronically Signed   By: Franchot Gallo M.D.   On: 01/08/2019 12:17    EKG: Independently reviewed.  Sinus rhythm Borderline  prolonged PR interval Nonspecific IVCD with LAD Probable lateral infarct, age indeterminate Unchanged from previous  Assessment/Plan Principal Problem:   Dehydration Active Problems:   Leukocytosis   Coronary artery disease   PAF (paroxysmal atrial fibrillation) (HCC)   OSA (obstructive sleep apnea)   Acute on chronic systolic CHF (congestive heart failure), NYHA class 4 (HCC)   CKD stage 3 due to type 2 diabetes mellitus ([place])   Essential hypertension   Automatic implantable cardioverter-defibrillator in situ   Long term current use of anticoagulant therapy   Diabetes mellitus with diabetic neuropathy, with long-term current use of insulin ([place])   Morbid obesity due to excess calories ([place])    1.  Dehydration: Patient received 1.3 L of fluid in the emergency department.  He has a very tenuous cardiac situation with a low ejection fraction.  At this point I am not can order any additional fluid will monitor him overnight.  We will follow blood pressures closely.  2.  Leukocytosis: No evidence of infection at this point.  Procalcitonin is low.  Check CBC in a.m.  Suspect this may be an acute phase reaction and demargination.  3.  Coronary artery disease: [place] home medication management monitor closely.  4.  Obstructive sleep apnea: Family requested to bring in CPAP from home.  5.  Acute on chronic systolic congestive heart failure New York Heart Association class IV: 35% with ischemic dilated cardiomyopathy, continue home medication regimen and place patient on an oral fluid restriction when dehydration improved.  6.  Chronic kidney disease stage III due to type 2 diabetes mellitus: Avoid nephrotoxic agents.  Continue diabetes management.  Monitor creatinine.  7.  Essential hypertension: Continue home medication regimen.  8.  Automatic implantable cardio defibrillator in situ with pacemaker: Pacemaker function is still enabled.  AICD was turned off last fall.  9.  Long-term  current use of anticoagulant therapy due to paroxysmal atrial fibrillation: Rate currently controlled.  Awaiting pharmacy to reconcile medications so we can order warfarin for tonight.  10.  Diabetic neuropathy due to diabetes with long-term current use of insulin: Noted.  As above.  11.  Morbid obesity due to excess calories: Patient would benefit from weight loss.  DVT prophylaxis: Warfarin awaiting pharmacy to reconcile meds Code Status: DO NOT RESUSCITATE Family Communication: sPoke with patient's wife was present at the bedside patient retains capacity Disposition Plan: Likely home in 3 or 4 days Consults called: None Admission status: Inpatient   Lady Deutscher MD FACP [facility] Pager [phone]  How to contact the [facility] Attending or Consulting provider [place] or covering provider during after hours [place], for this patient?  1. Check the care team in [facility] and look for a) attending/consulting [facility] provider listed and b) the [facility] team listed 2. Log into [email or web address] and use [place]'s universal password to access. If you do not have the password, please contact the hospital operator. 3. Locate the [facility] provider you are looking for under [facility] and page to a number that you can be directly reached. 4. If you still have difficulty reaching the provider, please page the [facility] (Director on Call) for the Hospitalists listed on amion for assistance.  If 7PM-7AM, please contact night-coverage [email or web address] Password [facility]  01/08/2019, 5:47 PM

## 2019-01-08 NOTE — ED Provider Notes (Signed)
[place] [facility] EMERGENCY DEPARTMENT Provider Note   CSN: [id number] Arrival date & time: 01/08/19  1145    History   Chief Complaint Chief Complaint  Patient presents with   Near Syncope    HPI [name] is a 85 y.o. male.     Pt presents to the ED today with near syncope.  Pt said he did not sleep well last night.  He was in the recliner and had a hard time getting warm.  His wife woke him up around 0930 and he had a hard time getting out of the recliner.  He was on his way to the bathroom and felt dizzy like he was going to pass out.  He was able to get to the bed and did not fall or hurt himself.  He did not have a loc.  Pt's BP and HR were low for EMS.  (No radial pulses). EMS gave him fluids and BP has improved.  He did not take any of his meds this am.  He denies any pain other than his usual LBP.  Pt does have a hx of afib and is on coumadin.  CHA2DS2/VAS Stroke Risk Points  Current as of 18 minutes ago     6 >= 2 Points: High Risk  1 - 1.99 Points: Medium Risk  0 Points: Low Risk    This is the only CHA2DS2/VAS Stroke Risk Points available for the past  year.:  Last Change: N/A     Details    This score determines the patient's risk of having a stroke if the  patient has atrial fibrillation.       Points Metrics  1 Has Congestive Heart Failure:  Yes    Current as of 18 minutes ago  1 Has Vascular Disease:  Yes    Current as of 18 minutes ago  1 Has Hypertension:  Yes    Current as of 18 minutes ago  2 Age:  54    Current as of 18 minutes ago  1 Has Diabetes:  Yes    Current as of 18 minutes ago  0 Had Stroke:  No  Had TIA:  No  Had thromboembolism:  No    Current as of 18 minutes ago  0 Male:  No    Current as of 18 minutes ago              Past Medical History:  Diagnosis Date   Atrial fibrillation (HCC)    persistent, previously seen at [facility] and placed on amiodarone   Benign prostatic hypertrophy    CAD (coronary  artery disease)    multivessel s/p inferolateral wall MI with subsequent CABG 11/1998.  Cath 2009 with Patent grafts   Cleft palate    COPD with emphysema ([place]) 04/01/2010   DM (diabetes mellitus), type 2 (HCC)    Dyspnea    GERD (gastroesophageal reflux disease)    HTN (hypertension)    Hyperlipidemia    Hypothyroidism    Iron deficiency anemia    Ischemic dilated cardiomyopathy (HCC)    EF 35-40% by MUGA 6/11   Myocardial infarction ([place])    Nasal septal deviation    Nephrolithiasis    OSA (obstructive sleep apnea)    PAF (paroxysmal atrial fibrillation) ([place])    Peripheral arterial disease (HCC)    left subclavian artery stenosis   PNA (pneumonia)    Psoriasis    Seborrheic keratosis    Stroke ([place])  Systolic congestive heart failure ([place]) 2009   s/p BiV ICD implantation by Dr Leonia Reeves (MDT)    Patient Active Problem List   Diagnosis Date Noted   Acute on chronic respiratory failure with hypoxia ([place]) 10/04/2018   HCAP (healthcare-associated pneumonia) 09/17/2018   Severe sepsis with septic shock ([place]) 07/13/2018   Supratherapeutic INR 07/13/2018   Hypoxia 07/13/2018   Hypothyroidism 07/13/2018   Pressure injury of skin 07/13/2018   Community acquired pneumonia 09/03/2016   Acute respiratory failure ([place]) 09/03/2016   SOB (shortness of breath)    A-fib ([place]) 06/11/2016   Atrial fibrillation, persistent    Allergic rhinitis 02/05/2016   Long term (current) use of anticoagulants [Z79.01] 32/44/0102   Chronic systolic heart failure (HCC)    Acute respiratory failure with hypoxia ([place]) 01/11/2016   Acute on chronic systolic CHF (congestive heart failure), NYHA class 3 ([place]) 01/11/2016   Demand ischemia ([place]) 01/11/2016   Acute renal failure superimposed on stage 3 chronic kidney disease ([place]) 01/11/2016   Diabetes mellitus with diabetic neuropathy, with long-term current use of insulin ([place]) 01/11/2016   Morbid obesity due to  excess calories ([place]) 01/11/2016   COPD (chronic obstructive pulmonary disease) ([place]) 01/10/2016   Coronary artery disease    Ischemic dilated cardiomyopathy (HCC)    PAF (paroxysmal atrial fibrillation) (HCC)    OSA (obstructive sleep apnea)    Peripheral vascular disease ([place]) 10/02/2013   Long term current use of anticoagulant therapy 08/21/2013   Essential hypertension 04/01/2010   Automatic implantable cardioverter-defibrillator in situ 03/31/2010    Past Surgical History:  Procedure Laterality Date   BI-VENTRICULAR IMPLANTABLE CARDIOVERTER DEFIBRILLATOR  (CRT-D)  10-08-08; 11-06-2013   Dr Leonia Reeves (MDT) implant for primary prevention; gen change to MDT VivaXT CRTD by Dr Rayann Heman   BIV ICD GENERTAOR CHANGE OUT N/A 11/06/2013   Procedure: BIV ICD GENERTAOR CHANGE OUT;  Surgeon: Coralyn Mark, MD;  Location: [facility] CATH LAB;  Service: Cardiovascular;  Laterality: N/A;   c-spine surgery     CARDIOVERSION N/A 04/29/2016   Procedure: CARDIOVERSION;  Surgeon: Pixie Casino, MD;  Location: [facility] ENDOSCOPY;  Service: Cardiovascular;  Laterality: N/A;   CARPAL TUNNEL RELEASE     CATARACT EXTRACTION     CORONARY ARTERY BYPASS GRAFT     LIMA to LAD, SVG to OM, SVG to diagonal   ELECTROPHYSIOLOGIC STUDY N/A 06/11/2016   Procedure: Atrial Fibrillation Ablation;  Surgeon: Thompson Grayer, MD;  Location: [facility] CV LAB;  Service: Cardiovascular;  Laterality: N/A;   left cleft palate and left cleft lip repair          Home Medications    Prior to Admission medications   Medication Sig Start Date End Date Taking? Authorizing Provider  albuterol (PROVENTIL HFA;VENTOLIN HFA) 108 (90 BASE) MCG/ACT inhaler Inhale 2 puffs into the lungs every 6 (six) hours as needed for wheezing or shortness of breath. Must keep June 2016 appt. 03/04/15   Chesley Mires, MD  albuterol (PROVENTIL) (2.5 MG/3ML) 0.083% nebulizer solution USE 1 VIAL VIA NEBULIZER 3 TIMES A DAY AS DIRECTED. DX:J44.9 Patient  taking differently: Take 2.5 mg by nebulization daily.  08/21/18   Chesley Mires, MD  amiodarone (PACERONE) 400 MG tablet TAKE HALF TABLET (200 MG TOTAL) BY MOUTH DAILY. Patient taking differently: Take 200 mg by mouth every morning.  02/21/18   Allred, Jeneen Rinks, MD  atorvastatin (LIPITOR) 80 MG tablet TAKE 1 TABLET (80 MG TOTAL) BY MOUTH DAILY. Patient taking differently: Take 80 mg by mouth  every morning.  07/10/18   Jettie Booze, MD  bisacodyl (DULCOLAX) 5 MG EC tablet Take 2 tablets (10 mg total) by mouth daily at 12 noon. Patient taking differently: Take 10 mg by mouth daily as needed for mild constipation.  09/06/16   Regalado, Belkys A, MD  carvedilol (COREG) 6.25 MG tablet Take 0.5 tablets (3.125 mg total) by mouth 2 (two) times daily. Take if systolic blood pressure >102 Patient taking differently: Take 3.125 mg by mouth See admin instructions. Take if systolic blood pressure >585 take 3.125 mg and if below he does not take any thing 10/02/18   Jettie Booze, MD  cholecalciferol (VITAMIN D) 1000 units tablet Take 2,000 Units by mouth every evening.    [provider]  dextromethorphan-guaiFENesin (MUCINEX DM) 30-600 MG 12hr tablet Take 1 tablet by mouth 2 (two) times daily as needed for cough.    [provider]  feeding supplement, ENSURE ENLIVE, (ENSURE ENLIVE) LIQD Take 237 mLs by mouth 2 (two) times daily between meals. 10/07/18   Ghimire, Henreitta Leber, MD  ferrous sulfate 325 (65 FE) MG tablet Take 325 mg by mouth every evening.     [provider]  fluticasone (FLONASE) 50 MCG/ACT nasal spray SPRAY 2 SPRAYS INTO EACH NOSTRIL EVERY DAY Patient taking differently: Place 2 sprays into both nostrils every morning.  08/16/18   Chesley Mires, MD  furosemide (LASIX) 40 MG tablet Take 1 tablet (40 mg total) by mouth daily. Patient taking differently: Take 40 mg by mouth 2 (two) times daily. Changed to 40 mg twice a day by Dr Beather Arbour on 10/12/18 10/02/18   Jettie Booze, MD  gabapentin (NEURONTIN) 100 MG capsule Take 100 mg by mouth 3 (three) times daily.    [provider]  Insulin Degludec-Liraglutide (XULTOPHY) 100-3.6 UNIT-MG/ML SOPN Inject 16 Units into the skin every morning.    [provider]  levothyroxine (SYNTHROID, LEVOTHROID) 100 MCG tablet Take 100 mcg by mouth daily before breakfast.  09/23/15   [provider]  Multiple Vitamins-Minerals (PRESERVISION AREDS PO) Take 1 tablet by mouth 2 (two) times daily.    [provider]  potassium chloride 20 MEQ TBCR Take 20 mEq by mouth daily. 07/15/18   Thurnell Lose, MD  PRESCRIPTION MEDICATION Inhale into the lungs at bedtime. CPAP    [provider]  senna (SENOKOT) 8.6 MG tablet Take 1 tablet by mouth 2 (two) times daily.     [provider]  traMADol (ULTRAM) 50 MG tablet Take 50 mg by mouth 3 (three) times daily as needed for moderate pain.  06/07/18   [provider]  TRELEGY ELLIPTA 100-62.5-25 MCG/INH AEPB INHALE 1 PUFF INTO LUNGS ONCE A DAY Patient taking differently: Take 1 puff by mouth every morning.  07/06/18   Chesley Mires, MD  vitamin B-12 (CYANOCOBALAMIN) 1000 MCG tablet Take 1,000 mcg by mouth daily.    [provider]  warfarin (COUMADIN) 5 MG tablet TAKE AS DIRECTED BY COUMADIN CLINIC Patient taking differently: Take 2.5-5 mg by mouth See admin instructions. Take 1/2 tablet (2.5mg ) by mouth in the evenings on Wed and Sat. Take 1 tablet (5mg ) all other days in the evening 03/31/18   Jettie Booze, MD    Family History Family History  Problem Relation Age of Onset   Asthma Father    Stroke Father    Hypertension Father    Hypertension Mother    Heart disease Brother    Heart  attack Brother    Hypertension Sister    Hypertension Brother     Social History Social History   Tobacco Use   Smoking status: Former Smoker    Packs/day: 3.00    Years: 65.00    Pack years: 195.00     Types: Cigarettes    Last attempt to quit: 10/25/1998    Years since quitting: 20.2   Smokeless tobacco: Never Used  Substance Use Topics   Alcohol use: No    Alcohol/week: 0.0 standard drinks    Comment: remote history of heavy alcohol use   Drug use: No     Allergies   Aldactone [spironolactone]   Review of Systems Review of Systems  Musculoskeletal: Positive for back pain.  Neurological: Positive for weakness.  All other systems reviewed and are negative.    Physical Exam Updated Vital Signs BP (!) 112/53    Pulse 71    Temp 98.6 F (37 C) (Oral)    Resp 18    SpO2 91%   Physical Exam Vitals signs and nursing note reviewed.  Constitutional:      Appearance: Normal appearance.  HENT:     Head: Normocephalic and atraumatic.     Right Ear: External ear normal.     Left Ear: External ear normal.     Nose: Nose normal.     Mouth/Throat:     Mouth: Mucous membranes are dry.  Eyes:     Extraocular Movements: Extraocular movements intact.     Conjunctiva/sclera: Conjunctivae normal.     Pupils: Pupils are equal, round, and reactive to light.  Neck:     Musculoskeletal: Normal range of motion and neck supple.  Cardiovascular:     Rate and Rhythm: Normal rate and regular rhythm.     Pulses: Normal pulses.     Heart sounds: Normal heart sounds.  Pulmonary:     Effort: Pulmonary effort is normal.     Breath sounds: Normal breath sounds.  Abdominal:     General: Abdomen is flat.  Musculoskeletal: Normal range of motion.  Skin:    General: Skin is warm.     Capillary Refill: Capillary refill takes less than 2 seconds.  Neurological:     General: No focal deficit present.     Mental Status: He is alert and oriented to person, place, and time.  Psychiatric:        Mood and Affect: Mood normal.        Behavior: Behavior normal.      ED Treatments / Results  Labs (all labs ordered are listed, but only abnormal results are displayed) Labs Reviewed  CBC WITH  DIFFERENTIAL/PLATELET - Abnormal; Notable for the following components:      Result Value   WBC 24.5 (*)    RBC 4.17 (*)    Hemoglobin 12.2 (*)    Neutro Abs 22.5 (*)    Abs Immature Granulocytes 0.35 (*)    All other components within normal limits  COMPREHENSIVE METABOLIC PANEL - Abnormal; Notable for the following components:   Glucose, Bld 173 (*)    BUN 24 (*)    Creatinine, Ser 1.44 (*)    Total Protein 6.2 (*)    Albumin 2.9 (*)    GFR calc non Af Amer 45 (*)    GFR calc Af Amer 52 (*)    All other components within normal limits  PROTIME-INR - Abnormal; Notable for the following components:   Prothrombin Time 30.1 (*)  INR 2.9 (*)    All other components within normal limits  LACTIC ACID, PLASMA - Abnormal; Notable for the following components:   Lactic Acid, Venous 2.0 (*)    All other components within normal limits  CULTURE, BLOOD (ROUTINE X 2)  CULTURE, BLOOD (ROUTINE X 2)  URINALYSIS, ROUTINE W REFLEX MICROSCOPIC  LACTIC ACID, PLASMA  CBG MONITORING, ED    EKG EKG Interpretation  Date/Time:  Monday January 08 2019 11:59:27 EDT Ventricular Rate:  79 PR Interval:    QRS Duration: 128 QT Interval:  495 QTC Calculation: 568 R Axis:   -46 Text Interpretation:  Sinus rhythm Borderline prolonged PR interval Nonspecific IVCD with LAD Probable lateral infarct, age indeterminate No significant change since last tracing Confirmed by Isla Pence ([id number]) on 01/08/2019 12:24:27 PM   Radiology Dg Chest Port 1 View  Result Date: 01/08/2019 CLINICAL DATA:  Syncope EXAM: PORTABLE CHEST 1 VIEW COMPARISON:  10/04/2018, 09/20/2018 FINDINGS: Cardiac enlargement with changes of CABG and AICD.  Underlying COPD Progression of right pleural effusion and right lower lobe airspace disease. Improvement in right upper lobe infiltrate since the prior study. Pulmonary vascular congestion and probable edema have progressed in the interval. IMPRESSION: Progression of vascular congestion  and bilateral airspace disease likely due to pulmonary edema. Progression of right pleural effusion and right lower lobe atelectasis. Improvement in right upper lobe infiltrate which may have been pneumonia on 10/04/2018. Electronically Signed   By: Franchot Gallo M.D.   On: 01/08/2019 12:17    Procedures Procedures (including critical care time)  Medications Ordered in ED Medications  sodium chloride 0.9 % bolus 1,000 mL (0 mLs Intravenous Stopped 01/08/19 1302)    And  0.9 %  sodium chloride infusion ( Intravenous New Bag/Given 01/08/19 1305)  cefTRIAXone (ROCEPHIN) 1 g in sodium chloride 0.9 % 100 mL IVPB (has no administration in time range)  azithromycin (ZITHROMAX) 500 mg in sodium chloride 0.9 % 250 mL IVPB (has no administration in time range)  sodium chloride 0.9 % bolus 1,000 mL (1,000 mLs Intravenous New Bag/Given 01/08/19 1535)     Initial Impression / Assessment and Plan / ED Course  I have reviewed the triage vital signs and the nursing notes.  Pertinent labs & imaging results that were available during my care of the patient were reviewed by me and considered in my medical decision making (see chart for details).    Pt's pacemaker interrogated and no abnormal activity noted, but he does have some fluid overload.  Pt's WBC is elevated and hypotensive.  He does not have an obvious source of infection and no fever.  He is still unable to get up and ambulate.    He is in some CHF, so I don't want to hydrate him too aggressively.  Pt d/w Dr. Evangeline Gula ([facility]) who will admit.  Final Clinical Impressions(s) / ED Diagnoses   Final diagnoses:  Dehydration  Leukocytosis, unspecified type    ED Discharge Orders    None       Isla Pence, MD 01/08/19 1558

## 2019-01-08 NOTE — ED Triage Notes (Signed)
Pt arrives to ED from home with complaints of a near syncopal episode as patient was walking with his walker. Pt stated he suddenly got weak at the knees and started to fall but caught himself. Pt was found bradycardiac and hypotensive on arrival per EMS.

## 2019-01-09 ENCOUNTER — Other Ambulatory Visit: Payer: Self-pay

## 2019-01-09 DIAGNOSIS — I25119 Atherosclerotic heart disease of native coronary artery with unspecified angina pectoris: Secondary | ICD-10-CM

## 2019-01-09 LAB — BASIC METABOLIC PANEL
Anion gap: 7 (ref 5–15)
BUN: 23 mg/dL (ref 8–23)
CO2: 26 mmol/L (ref 22–32)
Calcium: 8.5 mg/dL — ABNORMAL LOW (ref 8.9–10.3)
Chloride: 107 mmol/L (ref 98–111)
Creatinine, Ser: 1.22 mg/dL (ref 0.61–1.24)
GFR calc Af Amer: >60 mL/min (ref >60)
GFR calc non Af Amer: 54 mL/min — ABNORMAL LOW (ref >60)
Glucose, Bld: 99 mg/dL (ref 70–99)
Potassium: 4 mmol/L (ref 3.5–5.1)
Sodium: 140 mmol/L (ref 135–145)

## 2019-01-09 LAB — CBC
HCT: 32.9 % — ABNORMAL LOW (ref 39.0–52.0)
Hemoglobin: 10.8 g/dL — ABNORMAL LOW (ref 13.0–17.0)
MCH: 31 pg (ref 26.0–34.0)
MCHC: 32.8 g/dL (ref 30.0–36.0)
MCV: 94.5 fL (ref 80.0–100.0)
Platelets: 202 10*3/uL (ref 150–400)
RBC: 3.48 MIL/uL — ABNORMAL LOW (ref 4.22–5.81)
RDW: 14.3 % (ref 11.5–15.5)
WBC: 15.3 10*3/uL — ABNORMAL HIGH (ref 4.0–10.5)
nRBC: 0 % (ref 0.0–0.2)

## 2019-01-09 LAB — GLUCOSE, CAPILLARY
Glucose-Capillary: 91 mg/dL (ref 70–99)
Glucose-Capillary: 125 mg/dL — ABNORMAL HIGH (ref 70–99)
Glucose-Capillary: 112 mg/dL — ABNORMAL HIGH (ref 70–99)
Glucose-Capillary: 140 mg/dL — ABNORMAL HIGH (ref 70–99)

## 2019-01-09 LAB — BRAIN NATRIURETIC PEPTIDE: B Natriuretic Peptide: 569.8 pg/mL — ABNORMAL HIGH (ref 0.0–100.0)

## 2019-01-09 LAB — PROTIME-INR
INR: 2.9 — ABNORMAL HIGH (ref 0.8–1.2)
Prothrombin Time: 29.8 seconds — ABNORMAL HIGH (ref 11.4–15.2)

## 2019-01-09 MED ORDER — AMIODARONE HCL 200 MG PO TABS
200.0000 mg | ORAL_TABLET | Freq: Every day | ORAL | Status: DC
  Administered 2019-01-09 – 2019-01-14 (×6): 200 mg via ORAL
  Filled 2019-01-09 (×6): qty 1

## 2019-01-09 MED ORDER — FUROSEMIDE 10 MG/ML IJ SOLN
40.0000 mg | Freq: Two times a day (BID) | INTRAMUSCULAR | Status: DC
  Administered 2019-01-09 – 2019-01-11 (×5): 40 mg via INTRAVENOUS
  Filled 2019-01-09 (×6): qty 4

## 2019-01-09 MED ORDER — CARVEDILOL 3.125 MG PO TABS
3.1250 mg | ORAL_TABLET | Freq: Two times a day (BID) | ORAL | Status: DC
  Administered 2019-01-09 – 2019-01-11 (×5): 3.125 mg via ORAL
  Filled 2019-01-09 (×5): qty 1

## 2019-01-09 MED ORDER — IPRATROPIUM-ALBUTEROL 0.5-2.5 (3) MG/3ML IN SOLN
3.0000 mL | RESPIRATORY_TRACT | Status: DC | PRN
  Administered 2019-01-09 – 2019-01-10 (×2): 3 mL via RESPIRATORY_TRACT
  Filled 2019-01-09 (×2): qty 3

## 2019-01-09 MED ORDER — LEVOTHYROXINE SODIUM 100 MCG PO TABS
100.0000 ug | ORAL_TABLET | Freq: Every day | ORAL | Status: DC
  Administered 2019-01-10 – 2019-01-14 (×5): 100 ug via ORAL
  Filled 2019-01-09 (×5): qty 1

## 2019-01-09 MED ORDER — ATORVASTATIN CALCIUM 80 MG PO TABS
80.0000 mg | ORAL_TABLET | Freq: Every day | ORAL | Status: DC
  Administered 2019-01-09 – 2019-01-13 (×5): 80 mg via ORAL
  Filled 2019-01-09 (×5): qty 1

## 2019-01-09 NOTE — Progress Notes (Signed)
PROGRESS NOTE    [name]  NWG:[id number] DOB: Dec 22, 1933 DOA: 01/08/2019 PCP: [name], MD    Brief Narrative:  85 year old male who presented after near syncope episode. He does have significant past medical history for atrial fibrillation, coronary artery disease, systolic heart failure, COPD, ulcerative sleep apnea and hypothyroidism.  Apparently he did not slept well the night before admission, he experienced a dizzy episode while walking to the bathroom, he was managed to go to his bedroom, and he did not loss his consciousness.  When EMS arrived his blood pressure and heart rate were low.  He received IV fluids (2000 ml) with improvement of his vital signs.  On his initial physical examination his blood pressure was 109/54, heart rate 69, respiratory rate 18, oxygen saturation 98%.  He had moist mucous membranes, his lungs had minimal rails, with mild increase respiratory effort, heart S1-S2 present, rhythmic, abdomen soft nontender, mild pretibial lower extremity edema.  Her sodium was 138, potassium 4.2, chloride 101, bicarb 28, glucose 173, BUN 24, creatinine 1.44, AST 32, ALT 16, BNP 638, white count 24.5, hemoglobin 12.2, hematocrit 39.3, platelets 222, procalcitonin 2.1.  INR was 2.9.  Urine analysis negative for infection.  Chest x-ray had increased vascular congestion and bilateral interstitial infiltrates along with small bilateral effusions, a right upper lobe infiltrate seems to be resolving compared from December chest film.  AICD in place.  EKG was 75 bpm, first-degree AV block, left axis deviation, right bundle branch block with T wave inversions in 1, aVL, V2 through V6, positive pacer spikes, V pacing.  Hospital with a working diagnosis of dehydration, complicated by acute on chronic systolic heart failure decompensation.   Assessment & Plan:   Principal Problem:   Dehydration Active Problems:   Essential hypertension   Automatic implantable  cardioverter-defibrillator in situ   Long term current use of anticoagulant therapy   Coronary artery disease   PAF (paroxysmal atrial fibrillation) (HCC)   OSA (obstructive sleep apnea)   Diabetes mellitus with diabetic neuropathy, with long-term current use of insulin (HCC)   Morbid obesity due to excess calories (HCC)   Acute on chronic systolic CHF (congestive heart failure), NYHA class 4 ([place])   CKD stage 3 due to type 2 diabetes mellitus (HCC)   Leukocytosis   1. Near syncope episode complicated with decompensated acute on chronic systolic heart failure exacerbation/ ischemic cardiomyopathy sp CABG/ LV systolic function 30 to 57% with severe diffuse hypokinesis with akinesis of the inferolateral and inferior myocardium. Patient at home is clear that he did not have loss of consciousness. Today on exam he is volume overloaded, with clear dyspnea and rales on lung examination. Will stop IV fluids and will resume diuresis with IV furosemide to target a negative fluid balance. Strict in and out plus daily weight. Continue telemetry monitoring. Continue heart failure management with carvedilol.   2.  Reactive leukocytosis. No signs of infection, noted elevated procalcitonin, will plan to follow chest film post diuresis. Patient has remained afebrile and wbc trending down to 15 from 24.   3. CKD stage 3. Stable renal function with serum cr at 1,22 from 1,44 with K at 4,0 and serum bicarbonate at 26. Will follow on renal panel in am. Resume diuresis with IV furosemide.   4. HTN. Blood pressure has remained stable at 846 and 962 systolic, with negative orthostatics.   5. T2DM. Continue glucose cover and monitoring with insulin sliding scale.   6. Paroxysmal atrial fibrillation. Continue rate  control and telemetry monitoring, his heart rate has been 79 bpm. Continue amiodarone 200 mg daily.   7. Morbid obesity. Calculated BMI is 30,1. Will need outpatient follow up.   8. Dyslipidemia. Continue  atorvastatin.   9. Hypothyroid. Continue levothyroxine.   DVT prophylaxis: enoxaparin   Code Status: dnr Family Communication: no family at the bedside  Disposition Plan/ discharge barriers: pending clinica improvement,  Body mass index is 30.14 kg/m. Malnutrition Type:      Malnutrition Characteristics:      Nutrition Interventions:     RN Pressure Injury Documentation:    Consultants:     Procedures:     Antimicrobials:       Subjective: Patient is feeling dyspneic, no nausea or vomiting, no chest pain. Has been out of bed to the chair.   Objective: Vitals:   01/08/19 1741 01/08/19 1933 01/09/19 0100 01/09/19 0518  BP: 125/82 111/64 120/65 125/67  Pulse: 78 82 74 79  Resp: 20  18 18   Temp: 98.7 F (37.1 C) 98.7 F (37.1 C) 98 F (36.7 C) 98 F (36.7 C)  TempSrc: Oral Oral Oral Oral  SpO2: 99% 91% 98% 98%  Weight: 89 kg   89.9 kg  Height: 5\' 8"  (1.727 m)       Intake/Output Summary (Last 24 hours) at 01/09/2019 1245 Last data filed at 01/09/2019 1017 Gross per 24 hour  Intake 1632.84 ml  Output 975 ml  Net 657.84 ml   Filed Weights   01/08/19 1741 01/09/19 0518  Weight: 89 kg 89.9 kg    Examination:   General: Positive dyspnea. Deconditioned  Neurology: Awake and alert, non focal  E ENT: mild pallor, no icterus, oral mucosa moist Cardiovascular: No JVD. S1-S2 present, rhythmic, no gallops, rubs, or murmurs. No lower extremity edema. Pulmonary: positive breath sounds bilaterally, decreased air movement, no wheezing, or rhonchi but bilateral rales up to mid lung. Gastrointestinal. Abdomen protuberant with no organomegaly, non tender, no rebound or guarding Skin. No rashes Musculoskeletal: no joint deformities     Data Reviewed: I have personally reviewed following labs and imaging studies  CBC: Recent Labs  Lab 01/08/19 1220 01/09/19 0409  WBC 24.5* 15.3*  NEUTROABS 22.5*  --   HGB 12.2* 10.8*  HCT 39.3 32.9*  MCV 94.2  94.5  PLT 222 599   Basic Metabolic Panel: Recent Labs  Lab 01/08/19 1220 01/09/19 0409  NA 138 140  K 4.2 4.0  CL 101 107  CO2 28 26  GLUCOSE 173* 99  BUN 24* 23  CREATININE 1.44* 1.22  CALCIUM 9.0 8.5*   GFR: Estimated Creatinine Clearance: 50 mL/min (by C-G formula based on SCr of 1.22 mg/dL). Liver Function Tests: Recent Labs  Lab 01/08/19 1220  AST 32  ALT 16  ALKPHOS 77  BILITOT 0.6  PROT 6.2*  ALBUMIN 2.9*   No results for input(s): LIPASE, AMYLASE in the last 168 hours. No results for input(s): AMMONIA in the last 168 hours. Coagulation Profile: Recent Labs  Lab 01/08/19 1303 01/09/19 0409  INR 2.9* 2.9*   Cardiac Enzymes: No results for input(s): CKTOTAL, CKMB, CKMBINDEX, TROPONINI in the last 168 hours. BNP (last 3 results) Recent Labs    02/27/18 1045 03/06/18 0752 03/13/18 0805  PROBNP 10,884* 12,873* 5,305*   HbA1C: No results for input(s): HGBA1C in the last 72 hours. CBG: Recent Labs  Lab 01/08/19 2111 01/09/19 0605 01/09/19 1203  GLUCAP 146* 91 125*   Lipid Profile: No results for input(s): CHOL,  HDL, LDLCALC, TRIG, CHOLHDL, LDLDIRECT in the last 72 hours. Thyroid Function Tests: No results for input(s): TSH, T4TOTAL, FREET4, T3FREE, THYROIDAB in the last 72 hours. Anemia Panel: No results for input(s): VITAMINB12, FOLATE, FERRITIN, TIBC, IRON, RETICCTPCT in the last 72 hours.    Radiology Studies: I have reviewed all of the imaging during this hospital visit personally     Scheduled Meds: . insulin aspart  0-15 Units Subcutaneous TID WC  . sodium chloride flush  3 mL Intravenous Q12H   Continuous Infusions: . sodium chloride 125 mL/hr at 01/08/19 1305     LOS: 1 day        Mauricio Gerome Apley, MD

## 2019-01-09 NOTE — Progress Notes (Signed)
Per CCMD pt had 10 bts run of v-tach. Pt asymptomatic. Spouse at bedside at time of called.  [facility] hospitalist paged to make aware.  EKG printed and placed on pts chart.

## 2019-01-09 NOTE — Patient Outreach (Signed)
  [facility]) Care Management Chronic Special Needs Program   01/09/2019  Name: [name], DOB: 08/31/1934  MRN: [id number]   [name] is enrolled in a chronic special needs plan for Heart Failure.   Client noted to be admitted to the hospital on 01/08/2019 with near syncope, dehydration, leukocytosis.  Plan: per policy procedure, RNCM will send care plan to utilization management team. Continue to follow.   Thea Silversmith, RN, MSN, [place] [phone]

## 2019-01-09 NOTE — Discharge Instructions (Signed)

## 2019-01-09 NOTE — Plan of Care (Signed)

## 2019-01-09 NOTE — Plan of Care (Signed)

## 2019-01-09 NOTE — Plan of Care (Signed)
  Problem: Education: Goal: Knowledge of General Education information will improve Description Including pain rating scale, medication(s)/side effects and non-pharmacologic comfort measures 01/09/2019 0042 by Orvilla Fus, RN Outcome: Progressing 01/09/2019 0042 by Orvilla Fus, RN Outcome: Progressing   Problem: Health Behavior/Discharge Planning: Goal: Ability to manage health-related needs will improve 01/09/2019 0042 by Orvilla Fus, RN Outcome: Progressing 01/09/2019 0042 by Orvilla Fus, RN Outcome: Progressing   Problem: Clinical Measurements: Goal: Ability to maintain clinical measurements within normal limits will improve 01/09/2019 0042 by Orvilla Fus, RN Outcome: Progressing 01/09/2019 0042 by Orvilla Fus, RN Outcome: Progressing Goal: Will remain free from infection 01/09/2019 0042 by Orvilla Fus, RN Outcome: Progressing 01/09/2019 0042 by Orvilla Fus, RN Outcome: Progressing Goal: Diagnostic test results will improve 01/09/2019 0042 by Orvilla Fus, RN Outcome: Progressing 01/09/2019 0042 by Orvilla Fus, RN Outcome: Progressing Goal: Respiratory complications will improve 01/09/2019 0042 by Orvilla Fus, RN Outcome: Progressing 01/09/2019 0042 by Orvilla Fus, RN Outcome: Not Met (add Reason) Goal: Cardiovascular complication will be avoided 01/09/2019 0042 by Orvilla Fus, RN Outcome: Progressing 01/09/2019 0042 by Orvilla Fus, RN Outcome: Progressing   Problem: Activity: Goal: Risk for activity intolerance will decrease 01/09/2019 0042 by Orvilla Fus, RN Outcome: Progressing 01/09/2019 0042 by Orvilla Fus, RN Outcome: Progressing   Problem: Nutrition: Goal: Adequate nutrition will be maintained 01/09/2019 0042 by Orvilla Fus, RN Outcome: Progressing 01/09/2019 0042 by Orvilla Fus, RN Outcome: Progressing   Problem: Coping: Goal: Level of anxiety will decrease 01/09/2019 0042 by  Orvilla Fus, RN Outcome: Progressing 01/09/2019 0042 by Orvilla Fus, RN Outcome: Progressing   Problem: Elimination: Goal: Will not experience complications related to bowel motility 01/09/2019 0042 by Orvilla Fus, RN Outcome: Progressing 01/09/2019 0042 by Orvilla Fus, RN Outcome: Progressing Goal: Will not experience complications related to urinary retention 01/09/2019 0042 by Orvilla Fus, RN Outcome: Progressing 01/09/2019 0042 by Orvilla Fus, RN Outcome: Progressing   Problem: Pain Managment: Goal: General experience of comfort will improve 01/09/2019 0042 by Orvilla Fus, RN Outcome: Progressing 01/09/2019 0042 by Orvilla Fus, RN Outcome: Progressing   Problem: Safety: Goal: Ability to remain free from injury will improve 01/09/2019 0042 by Orvilla Fus, RN Outcome: Progressing 01/09/2019 0042 by Orvilla Fus, RN Outcome: Progressing   Problem: Skin Integrity: Goal: Risk for impaired skin integrity will decrease 01/09/2019 0042 by Orvilla Fus, RN Outcome: Progressing 01/09/2019 0042 by Orvilla Fus, RN Outcome: Progressing

## 2019-01-10 ENCOUNTER — Inpatient Hospital Stay (HOSPITAL_COMMUNITY): Payer: HMO

## 2019-01-10 LAB — BASIC METABOLIC PANEL
Anion gap: 8 (ref 5–15)
BUN: 21 mg/dL (ref 8–23)
CO2: 27 mmol/L (ref 22–32)
Calcium: 8.6 mg/dL — ABNORMAL LOW (ref 8.9–10.3)
Chloride: 102 mmol/L (ref 98–111)
Creatinine, Ser: 1.08 mg/dL (ref 0.61–1.24)
GFR calc Af Amer: >60 mL/min (ref >60)
GFR calc non Af Amer: >60 mL/min (ref >60)
Glucose, Bld: 133 mg/dL — ABNORMAL HIGH (ref 70–99)
Potassium: 3.9 mmol/L (ref 3.5–5.1)
Sodium: 137 mmol/L (ref 135–145)

## 2019-01-10 LAB — CBC WITH DIFFERENTIAL/PLATELET
Abs Immature Granulocytes: 0.05 10*3/uL (ref 0.00–0.07)
Basophils Absolute: 0 10*3/uL (ref 0.0–0.1)
Basophils Relative: 0 %
Eosinophils Absolute: 0.1 10*3/uL (ref 0.0–0.5)
Eosinophils Relative: 1 %
HCT: 33.3 % — ABNORMAL LOW (ref 39.0–52.0)
Hemoglobin: 11 g/dL — ABNORMAL LOW (ref 13.0–17.0)
Immature Granulocytes: 0 %
Lymphocytes Relative: 11 %
Lymphs Abs: 1.2 10*3/uL (ref 0.7–4.0)
MCH: 30.5 pg (ref 26.0–34.0)
MCHC: 33 g/dL (ref 30.0–36.0)
MCV: 92.2 fL (ref 80.0–100.0)
Monocytes Absolute: 0.6 10*3/uL (ref 0.1–1.0)
Monocytes Relative: 6 %
Neutro Abs: 9.2 10*3/uL — ABNORMAL HIGH (ref 1.7–7.7)
Neutrophils Relative %: 82 %
Platelets: 218 10*3/uL (ref 150–400)
RBC: 3.61 MIL/uL — ABNORMAL LOW (ref 4.22–5.81)
RDW: 13.7 % (ref 11.5–15.5)
WBC: 11.3 10*3/uL — ABNORMAL HIGH (ref 4.0–10.5)
nRBC: 0 % (ref 0.0–0.2)

## 2019-01-10 LAB — PROTIME-INR
INR: 1.8 — ABNORMAL HIGH (ref 0.8–1.2)
Prothrombin Time: 20.9 seconds — ABNORMAL HIGH (ref 11.4–15.2)

## 2019-01-10 LAB — GLUCOSE, CAPILLARY
Glucose-Capillary: 156 mg/dL — ABNORMAL HIGH (ref 70–99)
Glucose-Capillary: 145 mg/dL — ABNORMAL HIGH (ref 70–99)
Glucose-Capillary: 116 mg/dL — ABNORMAL HIGH (ref 70–99)
Glucose-Capillary: 150 mg/dL — ABNORMAL HIGH (ref 70–99)

## 2019-01-10 MED ORDER — SENNA 8.6 MG PO TABS
1.0000 | ORAL_TABLET | Freq: Two times a day (BID) | ORAL | Status: DC
  Administered 2019-01-10 – 2019-01-14 (×9): 8.6 mg via ORAL
  Filled 2019-01-10 (×12): qty 1

## 2019-01-10 MED ORDER — POTASSIUM CHLORIDE CRYS ER 20 MEQ PO TBCR
20.0000 meq | EXTENDED_RELEASE_TABLET | Freq: Every day | ORAL | Status: DC
  Administered 2019-01-10 – 2019-01-11 (×2): 20 meq via ORAL
  Filled 2019-01-10 (×2): qty 1

## 2019-01-10 MED ORDER — BUDESONIDE 0.25 MG/2ML IN SUSP
0.2500 mg | Freq: Two times a day (BID) | RESPIRATORY_TRACT | Status: DC
  Administered 2019-01-10 – 2019-01-14 (×8): 0.25 mg via RESPIRATORY_TRACT
  Filled 2019-01-10 (×9): qty 2

## 2019-01-10 MED ORDER — WARFARIN - PHARMACIST DOSING INPATIENT
Freq: Every day | Status: DC
  Administered 2019-01-10: 19:00:00
  Administered 2019-01-13: 1

## 2019-01-10 MED ORDER — FLUTICASONE PROPIONATE 50 MCG/ACT NA SUSP
2.0000 | Freq: Every day | NASAL | Status: DC
  Administered 2019-01-10 – 2019-01-14 (×5): 2 via NASAL
  Filled 2019-01-10: qty 16

## 2019-01-10 MED ORDER — FLUTICASONE-UMECLIDIN-VILANT 100-62.5-25 MCG/INH IN AEPB: 1.0000 | INHALATION_SPRAY | RESPIRATORY_TRACT | Status: DC

## 2019-01-10 MED ORDER — TRAMADOL HCL 50 MG PO TABS
50.0000 mg | ORAL_TABLET | Freq: Every evening | ORAL | Status: DC | PRN
  Administered 2019-01-14: 50 mg via ORAL
  Filled 2019-01-10: qty 1

## 2019-01-10 MED ORDER — WARFARIN SODIUM 7.5 MG PO TABS
7.5000 mg | ORAL_TABLET | Freq: Once | ORAL | Status: AC
  Administered 2019-01-10: 7.5 mg via ORAL
  Filled 2019-01-10: qty 1

## 2019-01-10 MED ORDER — POTASSIUM CHLORIDE ER 20 MEQ PO TBCR: 20.0000 meq | EXTENDED_RELEASE_TABLET | Freq: Every day | ORAL | Status: DC

## 2019-01-10 MED ORDER — ALBUTEROL SULFATE (2.5 MG/3ML) 0.083% IN NEBU
2.5000 mg | INHALATION_SOLUTION | Freq: Every day | RESPIRATORY_TRACT | Status: DC
  Administered 2019-01-11 – 2019-01-14 (×4): 2.5 mg via RESPIRATORY_TRACT
  Filled 2019-01-10 (×4): qty 3

## 2019-01-10 MED ORDER — GABAPENTIN 100 MG PO CAPS: 100.0000 mg | ORAL_CAPSULE | Freq: Three times a day (TID) | ORAL | Status: DC

## 2019-01-10 MED ORDER — UMECLIDINIUM-VILANTEROL 62.5-25 MCG/INH IN AEPB
1.0000 | INHALATION_SPRAY | Freq: Every day | RESPIRATORY_TRACT | Status: DC
  Administered 2019-01-10 – 2019-01-14 (×5): 1 via RESPIRATORY_TRACT
  Filled 2019-01-10: qty 14

## 2019-01-10 MED ORDER — FERROUS SULFATE 325 (65 FE) MG PO TABS
325.0000 mg | ORAL_TABLET | Freq: Every evening | ORAL | Status: DC
  Administered 2019-01-10 – 2019-01-13 (×4): 325 mg via ORAL
  Filled 2019-01-10 (×4): qty 1

## 2019-01-10 NOTE — Progress Notes (Signed)
   01/10/19 1100  Mobility  Activity Ambulated in hall (In chair before and after ambulation)  Range of Motion Active;All extremities  Level of Assistance Standby assist, set-up cues, supervision of patient - no hands on  Assistive Device Front wheel walker  Minutes Stood 5 minutes  Minutes Ambulated 5 minutes  Distance Ambulated (ft) 220 ft  Mobility Response Tolerated well  Bed Position Chair   SATURATION QUALIFICATIONS: (This note is used to comply with regulatory documentation for home oxygen)  Patient Saturations on Room Air at Rest = 83%  Patient Saturations on Room Air while Ambulating = n/a%  Patient Saturations on 3 Liters of oxygen while Ambulating = 91%  Please briefly explain why patient needs home oxygen: Patient needed 3L to maintain a saturation of 91%>

## 2019-01-10 NOTE — Progress Notes (Addendum)
[facility]  PROGRESS NOTE  [name] QQP:[id number] DOB: 05/18/1934 DOA: 01/08/2019 PCP: [name], MD   Brief HPI:   85 year old male with a history of atrial fibrillation, CAD, chronic systolic heart failure, COPD, sleep apnea, hypothyroidism presented to the hospital with near syncope.  Patient says he did not lose consciousness.    Subjective   This morning patient is breathing better, not at baseline.   Assessment/Plan:     1. Near syncope-patient denies losing consciousness.  He does have a history of ischemic cardiomyopathy status post CABG with LV function 30 to 35% with severe diffuse hypokinesis.  Continue telemetry monitoring.  2. Acute on chronic systolic CHF-patient was started on IV Lasix and has diuresed, is net -1.4 L continue Lasix 40 mg IV every 12 hours.  3. Hypertension-orthostatic vital signs were negative for postural hypotension.  Continue Coreg.  4. Diabetes mellitus type 2-continue sliding scale insulin with NovoLog.  CBG well controlled.  5. Paroxysmal atrial fibrillation-heart rate is controlled,  continue amiodarone 200 mg p.o. daily.  Patient was taking Coumadin at home.  Will restart Coumadin per pharmacy.  6. Hypothyroidism-continue Synthroid  7. Dyslipidemia-continue atorvastatin.      CBG: Recent Labs  Lab 01/09/19 1615 01/09/19 2057 01/10/19 0607 01/10/19 1138 01/10/19 1609  GLUCAP 112* 140* 156* 145* 116*    CBC: Recent Labs  Lab 01/08/19 1220 01/09/19 0409 01/10/19 0341  WBC 24.5* 15.3* 11.3*  NEUTROABS 22.5*  --  9.2*  HGB 12.2* 10.8* 11.0*  HCT 39.3 32.9* 33.3*  MCV 94.2 94.5 92.2  PLT 222 202 712    Basic Metabolic Panel: Recent Labs  Lab 01/08/19 1220 01/09/19 0409 01/10/19 0341  NA 138 140 137  K 4.2 4.0 3.9  CL 101 107 102  CO2 28 26 27   GLUCOSE 173* 99 133*  BUN 24* 23 21  CREATININE 1.44* 1.22 1.08  CALCIUM 9.0 8.5* 8.6*     DVT prophylaxis: Lovenox  Code Status: Full  code  Family Communication: Discussed with wife at bedside  Disposition Plan: likely home when medically ready for discharge     Consultants:  None  Procedures:  None   Antibiotics:   Anti-infectives (From admission, onward)   Start     Dose/Rate Route Frequency Ordered Stop   01/08/19 1545  cefTRIAXone (ROCEPHIN) 1 g in sodium chloride 0.9 % 100 mL IVPB     1 g 200 mL/hr over 30 Minutes Intravenous  Once 01/08/19 1533 01/08/19 1635   01/08/19 1545  azithromycin (ZITHROMAX) 500 mg in sodium chloride 0.9 % 250 mL IVPB     500 mg 250 mL/hr over 60 Minutes Intravenous  Once 01/08/19 1533 01/08/19 1810       Objective   Vitals:   01/10/19 0456 01/10/19 0754 01/10/19 0855 01/10/19 1141  BP: (!) 162/80 (!) 147/77  115/61  Pulse: 89 79  69  Resp: 18   20  Temp: 98.7 F (37.1 C)   97.8 F (36.6 C)  TempSrc: Oral   Oral  SpO2: 95% 97% 97% 100%  Weight: 88.9 kg     Height:        Intake/Output Summary (Last 24 hours) at 01/10/2019 1648 Last data filed at 01/10/2019 1348 Gross per 24 hour  Intake 963 ml  Output 3300 ml  Net -2337 ml   Filed Weights   01/08/19 1741 01/09/19 0518 01/10/19 0456  Weight: 89 kg 89.9 kg 88.9 kg     Physical Examination:  General: Appears in no acute distress  Cardiovascular: S1-S2 regular  Respiratory: Clear to auscultation bilaterally  Abdomen: Abdomen is soft, nontender, no organomegaly  Extremities: No edema of the lower extremities  Neurologic: Alert, oriented x3, no focal deficit noted     Data Reviewed: I have personally reviewed following labs and imaging studies   Recent Results (from the past 240 hour(s))  Culture, blood (routine x 2)     Status: None (Preliminary result)   Collection Time: 01/08/19  1:50 PM  Result Value Ref Range Status   Specimen Description BLOOD RIGHT ANTECUBITAL  Final   Special Requests   Final    BOTTLES DRAWN AEROBIC AND ANAEROBIC Blood Culture adequate volume   Culture    Final    NO GROWTH 2 DAYS Performed at [facility], [address], [place], [place] [address]    Report Status PENDING  Incomplete  Culture, blood (routine x 2)     Status: None (Preliminary result)   Collection Time: 01/08/19  2:00 PM  Result Value Ref Range Status   Specimen Description BLOOD RIGHT ARM  Final   Special Requests   Final    BOTTLES DRAWN AEROBIC ONLY Blood Culture adequate volume   Culture   Final    NO GROWTH 2 DAYS Performed at [facility], [address], [place], [place] [address]    Report Status PENDING  Incomplete     Liver Function Tests: Recent Labs  Lab 01/08/19 1220  AST 32  ALT 16  ALKPHOS 77  BILITOT 0.6  PROT 6.2*  ALBUMIN 2.9*   No results for input(s): LIPASE, AMYLASE in the last 168 hours. No results for input(s): AMMONIA in the last 168 hours.  Cardiac Enzymes: No results for input(s): CKTOTAL, CKMB, CKMBINDEX, TROPONINI in the last 168 hours. BNP (last 3 results) Recent Labs    10/04/18 1003 01/08/19 1220 01/09/19 0409  BNP 485.2* 638.9* 569.8*    ProBNP (last 3 results) Recent Labs    02/27/18 1045 03/06/18 0752 03/13/18 0805  PROBNP 10,884* 12,873* 5,305*      Studies: Dg Chest 2 View  Result Date: 01/10/2019 CLINICAL DATA:  Shortness of breath EXAM: CHEST - 2 VIEW COMPARISON:  January 08, 2019 FINDINGS: There is a persistent right pleural effusion. There is underlying interstitial edema. There is airspace consolidation in both lower lobes. There is cardiomegaly with mild pulmonary venous hypertension. Pacemaker leads are attached to the right atrium and right ventricle, stable. Patient is status post coronary artery bypass grafting. Bones appear osteoporotic. IMPRESSION: Pulmonary vascular congestion. Right pleural effusion. Consolidation in both lower lobes, a finding that may be due to either pneumonia or alveolar edema. Both entities may well be present concurrently. The overall appearance is felt to be  indicative of a degree of congestive heart failure. Appearance is essentially stable compared to 2 days prior. Electronically Signed   By: Lowella Grip III M.D.   On: 01/10/2019 07:43    Scheduled Meds: . albuterol  2.5 mg Nebulization Daily  . amiodarone  200 mg Oral Daily  . atorvastatin  80 mg Oral q1800  . umeclidinium-vilanterol  1 puff Inhalation Daily   And  . budesonide (PULMICORT) nebulizer solution  0.25 mg Nebulization BID  . carvedilol  3.125 mg Oral BID WC  . ferrous sulfate  325 mg Oral QPM  . fluticasone  2 spray Each Nare Daily  . furosemide  40 mg Intravenous BID  . insulin aspart  0-15 Units Subcutaneous TID WC  . levothyroxine  100 mcg Oral QAC breakfast  . potassium chloride  20 mEq Oral Daily  . senna  1 tablet Oral BID  . sodium chloride flush  3 mL Intravenous Q12H  . warfarin  7.5 mg Oral ONCE-1800  . Warfarin - Pharmacist Dosing Inpatient   Does not apply q1800    Admission status: Inpatient: Based on patients clinical presentation and evaluation of above clinical data, I have made determination that patient meets Inpatient criteria at this time  Time spent: 30 min  [facility] Pager [phone]. If 7PM-7AM, please contact night-coverage at [email or web address], Office  [phone]  password [email or web address]  01/10/2019, 4:48 PM  LOS: 2 days

## 2019-01-10 NOTE — Progress Notes (Signed)
ANTICOAGULATION CONSULT NOTE - Initial Consult  Pharmacy Consult for Coumadin Indication: atrial fibrillation  Allergies  Allergen Reactions  . Aldactone [Spironolactone] Other (See Comments)    Hyperkalemia  reported by Dr. [name] - pt is currently taking 12.5 mg daily -November 2017 per medication list by same MD    Patient Measurements: Height: 5\' 8"  (172.7 cm) Weight: 196 lb (88.9 kg)(scale b) IBW/kg (Calculated) : 68.4  Vital Signs: Temp: 98.7 F (37.1 C) (03/18 0456) Temp Source: Oral (03/18 0456) BP: 147/77 (03/18 0754) Pulse Rate: 79 (03/18 0754)  Labs: Recent Labs    01/08/19 1220 01/08/19 1303 01/09/19 0409 01/10/19 0341 01/10/19 0956  HGB 12.2*  --  10.8* 11.0*  --   HCT 39.3  --  32.9* 33.3*  --   PLT 222  --  202 218  --   LABPROT  --  30.1* 29.8*  --  20.9*  INR  --  2.9* 2.9*  --  1.8*  CREATININE 1.44*  --  1.22 1.08  --     Estimated Creatinine Clearance: 56.1 mL/min (by C-G formula based on SCr of 1.08 mg/dL).   Medical History: Past Medical History:  Diagnosis Date  . Atrial fibrillation ([place])    persistent, previously seen at [facility] and placed on amiodarone  . Benign prostatic hypertrophy   . CAD (coronary artery disease)    multivessel s/p inferolateral wall MI with subsequent CABG 11/1998.  Cath 2009 with Patent grafts  . Cleft palate   . COPD with emphysema ([place]) 04/01/2010  . DM (diabetes mellitus), type 2 ([place])   . Dyspnea   . GERD (gastroesophageal reflux disease)   . HTN (hypertension)   . Hyperlipidemia   . Hypothyroidism   . Iron deficiency anemia   . Ischemic dilated cardiomyopathy ([place])    EF 35-40% by MUGA 6/11  . Myocardial infarction ([place])   . Nasal septal deviation   . Nephrolithiasis   . OSA (obstructive sleep apnea)   . PAF (paroxysmal atrial fibrillation) ([place])   . Peripheral arterial disease ([facility])    left subclavian artery stenosis  . PNA (pneumonia)   . Psoriasis   . Seborrheic keratosis   . Stroke ([place])    . Systolic congestive heart failure ([place]) 2009   s/p BiV ICD implantation by Dr Leonia Reeves (MDT)   Assessment: CC/HPI: Near syncope  PMH: Afib, CAD, CHF, EF 35-40%, ICM, OSA, COPD, hypothyroid, low back pain, BPH, cleft palate, DM, GERD, IDA, PVD, psoriasis, seborrheic keratosis, CVA, OSA, CKD3, neuropathy, PPM  Anticoag: Warf PTA. INR 2.9. Hgb only 12.2>10.8 with h/o IDA. INR down to 1.8 after missing doses 3/16 and 3/17. CHADS2VASC=8 - PTA Coumadin: 5 mg daily, last dose 3/15  Goal of Therapy:  INR 2-3 Monitor platelets by anticoagulation protocol: Yes   Plan:  All home meds resumed Daily INR Coumadin 7.5mg  po x 1 tonight. Overlap heparin?   Tashonna Descoteaux S. Alford Highland, PharmD, [place] Clinical Staff Pharmacist Eilene Ghazi St[place] 01/10/2019,11:23 AM

## 2019-01-10 NOTE — Progress Notes (Signed)
Patient's wife reported pt awoke from sleeping.  Seemed very confused/disoriented.   Check Blood sugar and vitals.   All WNL   Pt reported he has not been sleeping well.   Told pt/wife will continue to monitor.

## 2019-01-11 ENCOUNTER — Ambulatory Visit (INDEPENDENT_AMBULATORY_CARE_PROVIDER_SITE_OTHER): Payer: HMO | Admitting: *Deleted

## 2019-01-11 DIAGNOSIS — I5022 Chronic systolic (congestive) heart failure: Secondary | ICD-10-CM

## 2019-01-11 DIAGNOSIS — I2510 Atherosclerotic heart disease of native coronary artery without angina pectoris: Secondary | ICD-10-CM

## 2019-01-11 LAB — BASIC METABOLIC PANEL
Anion gap: 11 (ref 5–15)
BUN: 19 mg/dL (ref 8–23)
CO2: 28 mmol/L (ref 22–32)
Calcium: 8.9 mg/dL (ref 8.9–10.3)
Chloride: 96 mmol/L — ABNORMAL LOW (ref 98–111)
Creatinine, Ser: 1.17 mg/dL (ref 0.61–1.24)
GFR calc Af Amer: >60 mL/min (ref >60)
GFR calc non Af Amer: 57 mL/min — ABNORMAL LOW (ref >60)
Glucose, Bld: 146 mg/dL — ABNORMAL HIGH (ref 70–99)
Potassium: 3.8 mmol/L (ref 3.5–5.1)
Sodium: 135 mmol/L (ref 135–145)

## 2019-01-11 LAB — PROTIME-INR
INR: 1.4 — ABNORMAL HIGH (ref 0.8–1.2)
Prothrombin Time: 17.3 seconds — ABNORMAL HIGH (ref 11.4–15.2)

## 2019-01-11 LAB — GLUCOSE, CAPILLARY
Glucose-Capillary: 134 mg/dL — ABNORMAL HIGH (ref 70–99)
Glucose-Capillary: 173 mg/dL — ABNORMAL HIGH (ref 70–99)
Glucose-Capillary: 230 mg/dL — ABNORMAL HIGH (ref 70–99)
Glucose-Capillary: 122 mg/dL — ABNORMAL HIGH (ref 70–99)

## 2019-01-11 MED ORDER — CARVEDILOL 3.125 MG PO TABS
3.1250 mg | ORAL_TABLET | Freq: Two times a day (BID) | ORAL | Status: DC
  Administered 2019-01-12 – 2019-01-14 (×4): 3.125 mg via ORAL
  Filled 2019-01-11 (×4): qty 1

## 2019-01-11 MED ORDER — WARFARIN SODIUM 10 MG PO TABS
10.0000 mg | ORAL_TABLET | ORAL | Status: AC
  Administered 2019-01-11: 10 mg via ORAL
  Filled 2019-01-11: qty 1

## 2019-01-11 NOTE — Evaluation (Signed)
Physical Therapy Evaluation Patient Details Name: [name] MRN: [id number] DOB: [birth year] Today's Date: 01/11/2019   History of Present Illness  85 year old male with a history of atrial fibrillation, CAD, chronic systolic heart failure, COPD, sleep apnea, hypothyroidism presented to the hospital with near syncope.  Patient says he did not lose consciousness.  Clinical Impression  Pt admitted with above diagnosis. Pt currently with functional limitations due to the deficits listed below (see PT Problem List). Pt was able to ambulate with RW on 3LO2 with sats >88% with min guard to min assist with cues for postureal stability.  Wife comfortable with caring for pt at home.  Will follow acutely. Pt will benefit from skilled PT to increase their independence and safety with mobility to allow discharge to the venue listed below.    SATURATION QUALIFICATIONS: (This note is used to comply with regulatory documentation for home oxygen)  Patient Saturations on Room Air at Rest = 80%  Patient Saturations on Room Air while Ambulating = NT as pt desats on RA.   Patient Saturations on 3 Liters of oxygen while Ambulating = 90%  Please briefly explain why patient needs home oxygen:Pt needed 3LO2 with activity and 2.5 L at rest to keep sats >90%.     Follow Up Recommendations Home health PT ( they decline HHPT currently);Supervision/Assistance - 24 hour    Equipment Recommendations  None recommended by PT    Recommendations for Other Services       Precautions / Restrictions Precautions Precautions: Fall Restrictions Weight Bearing Restrictions: No      Mobility  Bed Mobility Overal bed mobility: Needs Assistance Bed Mobility: Supine to Sit     Supine to sit: Min assist     General bed mobility comments: Pt needee assist to elevate trunk.   Transfers Overall transfer level: Needs assistance Equipment used: Rolling walker (2 wheeled) Transfers: Sit to/from Stand Sit to  Stand: Min guard         General transfer comment: steadying assist with sit to stands.  Practiced x 5.  Ambulation/Gait Ambulation/Gait assistance: Min guard;Min assist Gait Distance (Feet): 200 Feet Assistive device: Rolling walker (2 wheeled) Gait Pattern/deviations: Decreased stride length;Step-through pattern;Trunk flexed;Wide base of support   Gait velocity interpretation: <1.31 ft/sec, indicative of household ambulator General Gait Details: Pt needed cues to stand tall as he flexes the more fatigued he gets.  Also he neejded cues to stay close to RW and some assist to stay inside RW. Did desat to 80% on RA.  Needed 3L to keep sats >88%.   Stairs            Wheelchair Mobility    Modified Rankin (Stroke Patients Only)       Balance Overall balance assessment: Needs assistance Sitting-balance support: No upper extremity supported;Feet supported Sitting balance-Leahy Scale: Fair     Standing balance support: Bilateral upper extremity supported;During functional activity Standing balance-Leahy Scale: Poor Standing balance comment: relies on RW for support                             Pertinent Vitals/Pain Pain Assessment: No/denies pain    Home Living Family/patient expects to be discharged to:: Private residence Living Arrangements: Spouse/significant other Available Help at Discharge: Family;Available 24 hours/day Type of Home: House Home Access: Stairs to enter Entrance Stairs-Rails: Left Entrance Stairs-Number of Steps: 3(5 in the front) Home Layout: One level Home Equipment: Walker - 4 wheels;Cane -  single point      Prior Function Level of Independence: Independent with assistive device(s);Needs assistance   Gait / Transfers Assistance Needed: used rollator at all times.    ADL's / Homemaking Assistance Needed: wife occassionally assists with bathing and dressing        Hand Dominance   Dominant Hand: Right    Extremity/Trunk  Assessment   Upper Extremity Assessment Upper Extremity Assessment: Defer to OT evaluation    Lower Extremity Assessment Lower Extremity Assessment: Generalized weakness    Cervical / Trunk Assessment Cervical / Trunk Assessment: Kyphotic  Communication   Communication: No difficulties  Cognition Arousal/Alertness: Awake/alert Behavior During Therapy: WFL for tasks assessed/performed Overall Cognitive Status: Within Functional Limits for tasks assessed                                        General Comments      Exercises General Exercises - Lower Extremity Ankle Circles/Pumps: AROM;Both;10 reps;Seated Hip ABduction/ADduction: AROM;Both;5 reps;Standing Hip Flexion/Marching: AROM;Both;10 reps;Standing Toe Raises: AROM;Both;10 reps;Standing Heel Raises: AROM;Both;5 reps;Standing Other Exercises Other Exercises: sit to stand x 5   Assessment/Plan    PT Assessment Patient needs continued PT services  PT Problem List Decreased activity tolerance;Decreased balance;Decreased mobility;Decreased knowledge of use of DME;Decreased safety awareness;Decreased knowledge of precautions;Cardiopulmonary status limiting activity       PT Treatment Interventions DME instruction;Gait training;Therapeutic activities;Functional mobility training;Therapeutic exercise;Balance training;Stair training;Patient/family education    PT Goals (Current goals can be found in the Care Plan section)  Acute Rehab PT Goals Patient Stated Goal: to go home PT Goal Formulation: With patient Time For Goal Achievement: 01/25/19 Potential to Achieve Goals: Good    Frequency Min 3X/week   Barriers to discharge        Co-evaluation               AM-PAC PT "6 Clicks" Mobility  Outcome Measure Help needed turning from your back to your side while in a flat bed without using bedrails?: A Little Help needed moving from lying on your back to sitting on the side of a flat bed without  using bedrails?: A Little Help needed moving to and from a bed to a chair (including a wheelchair)?: A Little Help needed standing up from a chair using your arms (e.g., wheelchair or bedside chair)?: A Little Help needed to walk in hospital room?: A Little Help needed climbing 3-5 steps with a railing? : A Little 6 Click Score: 18    End of Session Equipment Utilized During Treatment: Gait belt;Oxygen Activity Tolerance: Patient limited by fatigue Patient left: in chair;with call bell/phone within reach;with chair alarm set;with family/visitor present Nurse Communication: Mobility status PT Visit Diagnosis: Unsteadiness on feet (R26.81);Muscle weakness (generalized) (M62.81)    Time: 8101-7510 PT Time Calculation (min) (ACUTE ONLY): 28 min   Charges:   PT Evaluation $PT Eval Moderate Complexity: 1 Mod PT Treatments $Gait Training: 8-22 mins        [place] Pager:  [phone]  Office:  [place] 01/11/2019, 11:16 AM

## 2019-01-11 NOTE — Progress Notes (Signed)
[facility]  PROGRESS NOTE  [name] ZWC:[id number] DOB: Jul 16, 1934 DOA: 01/08/2019 PCP: [name], MD   Brief HPI:   85 year old male with a history of atrial fibrillation, CAD, chronic systolic heart failure, COPD, sleep apnea, hypothyroidism presented to the hospital with near syncope.  Patient says he did not lose consciousness.    Subjective   Patient seen and examined, denies shortness of breath.  Still requiring oxygen 3 L/min   Assessment/Plan:     1. Near syncope-patient denies losing consciousness.  He does have a history of ischemic cardiomyopathy status post CABG with LV function 30 to 35% with severe diffuse hypokinesis.  Continue telemetry monitoring.  2. Acute on chronic systolic CHF-patient was started on IV Lasix and has diuresed, is net -3.6 L continue Lasix 40 mg IV every 12 hours.  3. Hypertension-orthostatic vital signs were negative for postural hypotension.  Continue Coreg.  4. Diabetes mellitus type 2-continue sliding scale insulin with NovoLog.  CBG well controlled.  5. Paroxysmal atrial fibrillation-heart rate is controlled,  continue amiodarone 200 mg p.o. daily.  Patient was taking Coumadin at home.  Continue Coumadin per pharmacy  6. Hypothyroidism-continue Synthroid  7. Dyslipidemia-continue atorvastatin.    CBG: Recent Labs  Lab 01/10/19 1138 01/10/19 1609 01/10/19 2122 01/11/19 0604 01/11/19 1117  GLUCAP 145* 116* 150* 134* 173*    CBC: Recent Labs  Lab 01/08/19 1220 01/09/19 0409 01/10/19 0341  WBC 24.5* 15.3* 11.3*  NEUTROABS 22.5*  --  9.2*  HGB 12.2* 10.8* 11.0*  HCT 39.3 32.9* 33.3*  MCV 94.2 94.5 92.2  PLT 222 202 235    Basic Metabolic Panel: Recent Labs  Lab 01/08/19 1220 01/09/19 0409 01/10/19 0341 01/11/19 0420  NA 138 140 137 135  K 4.2 4.0 3.9 3.8  CL 101 107 102 96*  CO2 28 26 27 28   GLUCOSE 173* 99 133* 146*  BUN 24* 23 21 19   CREATININE 1.44* 1.22 1.08 1.17  CALCIUM 9.0 8.5* 8.6*  8.9     DVT prophylaxis: Lovenox  Code Status: Full code  Family Communication: Discussed with wife at bedside  Disposition Plan: likely home when medically ready for discharge     Consultants:  None  Procedures:  None   Antibiotics:   Anti-infectives (From admission, onward)   Start     Dose/Rate Route Frequency Ordered Stop   01/08/19 1545  cefTRIAXone (ROCEPHIN) 1 g in sodium chloride 0.9 % 100 mL IVPB     1 g 200 mL/hr over 30 Minutes Intravenous  Once 01/08/19 1533 01/08/19 1635   01/08/19 1545  azithromycin (ZITHROMAX) 500 mg in sodium chloride 0.9 % 250 mL IVPB     500 mg 250 mL/hr over 60 Minutes Intravenous  Once 01/08/19 1533 01/08/19 1810       Objective   Vitals:   01/11/19 0814 01/11/19 0815 01/11/19 0905 01/11/19 1215  BP:   (!) 135/91 111/60  Pulse:  73 71 64  Resp:  18 18 20   Temp:   97.8 F (36.6 C) 98 F (36.7 C)  TempSrc:   Oral   SpO2: 94% 94% 96% 98%  Weight:      Height:        Intake/Output Summary (Last 24 hours) at 01/11/2019 1322 Last data filed at 01/11/2019 1220 Gross per 24 hour  Intake 1017 ml  Output 3975 ml  Net -2958 ml   Filed Weights   01/09/19 0518 01/10/19 0456 01/11/19 0413  Weight: 89.9 kg 88.9  kg 87.1 kg     Physical Examination:    General: Appears in no acute distress  Cardiovascular: S1-S2, regular  Respiratory: Clear to auscultation bilaterally  Abdomen: Abdomen is soft, nontender, no organomegaly  Extremities: No edema in the lower extremities  Neurologic: Alert, oriented x3, no focal deficit noted     Data Reviewed: I have personally reviewed following labs and imaging studies   Recent Results (from the past 240 hour(s))  Culture, blood (routine x 2)     Status: None (Preliminary result)   Collection Time: 01/08/19  1:50 PM  Result Value Ref Range Status   Specimen Description BLOOD RIGHT ANTECUBITAL  Final   Special Requests   Final    BOTTLES DRAWN AEROBIC AND ANAEROBIC Blood  Culture adequate volume   Culture   Final    NO GROWTH 3 DAYS Performed at [facility], [address] [place], [place] [address]    Report Status PENDING  Incomplete  Culture, blood (routine x 2)     Status: None (Preliminary result)   Collection Time: 01/08/19  2:00 PM  Result Value Ref Range Status   Specimen Description BLOOD RIGHT ARM  Final   Special Requests   Final    BOTTLES DRAWN AEROBIC ONLY Blood Culture adequate volume   Culture   Final    NO GROWTH 3 DAYS Performed at [facility], [address] [place], [place] [address]    Report Status PENDING  Incomplete     Liver Function Tests: Recent Labs  Lab 01/08/19 1220  AST 32  ALT 16  ALKPHOS 77  BILITOT 0.6  PROT 6.2*  ALBUMIN 2.9*   No results for input(s): LIPASE, AMYLASE in the last 168 hours. No results for input(s): AMMONIA in the last 168 hours.  Cardiac Enzymes: No results for input(s): CKTOTAL, CKMB, CKMBINDEX, TROPONINI in the last 168 hours. BNP (last 3 results) Recent Labs    10/04/18 1003 01/08/19 1220 01/09/19 0409  BNP 485.2* 638.9* 569.8*    ProBNP (last 3 results) Recent Labs    02/27/18 1045 03/06/18 0752 03/13/18 0805  PROBNP 10,884* 12,873* 5,305*      Studies: Dg Chest 2 View  Result Date: 01/10/2019 CLINICAL DATA:  Shortness of breath EXAM: CHEST - 2 VIEW COMPARISON:  January 08, 2019 FINDINGS: There is a persistent right pleural effusion. There is underlying interstitial edema. There is airspace consolidation in both lower lobes. There is cardiomegaly with mild pulmonary venous hypertension. Pacemaker leads are attached to the right atrium and right ventricle, stable. Patient is status post coronary artery bypass grafting. Bones appear osteoporotic. IMPRESSION: Pulmonary vascular congestion. Right pleural effusion. Consolidation in both lower lobes, a finding that may be due to either pneumonia or alveolar edema. Both entities may well be present concurrently.  The overall appearance is felt to be indicative of a degree of congestive heart failure. Appearance is essentially stable compared to 2 days prior. Electronically Signed   By: Lowella Grip III M.D.   On: 01/10/2019 07:43    Scheduled Meds: . albuterol  2.5 mg Nebulization Daily  . amiodarone  200 mg Oral Daily  . atorvastatin  80 mg Oral q1800  . umeclidinium-vilanterol  1 puff Inhalation Daily   And  . budesonide (PULMICORT) nebulizer solution  0.25 mg Nebulization BID  . carvedilol  3.125 mg Oral BID WC  . ferrous sulfate  325 mg Oral QPM  . fluticasone  2 spray Each Nare Daily  .  furosemide  40 mg Intravenous BID  . insulin aspart  0-15 Units Subcutaneous TID WC  . levothyroxine  100 mcg Oral QAC breakfast  . potassium chloride  20 mEq Oral Daily  . senna  1 tablet Oral BID  . sodium chloride flush  3 mL Intravenous Q12H  . Warfarin - Pharmacist Dosing Inpatient   Does not apply q1800    Admission status: Inpatient: Based on patients clinical presentation and evaluation of above clinical data, I have made determination that patient meets Inpatient criteria at this time  Time spent: 30 min  [facility] Pager [phone]. If 7PM-7AM, please contact night-coverage at [email or web address], Office  [phone]  password [email or web address]  01/11/2019, 1:22 PM  LOS: 3 days

## 2019-01-11 NOTE — Consult Note (Signed)
   [facility] CM Inpatient Consult   01/11/2019  [name] 06-15-1934 [id number]   Patient has a high risk score noted for unplanned readmission with 4 hospitlaizations in the past 6 months noted.  Patient is currently active with [facility] for chronic disease management services in the [place] with a [facility] Chronic Care Management Coordinator.   Chart reviewed from MD notes 01/09/2019 Dr. Sander Radon notes: 85 year old male who presented after near syncope episode. He does have significant past medical history for atrial fibrillation, coronary artery disease, systolic heart failure, COPD, ulcerative sleep apnea and hypothyroidism. Patient admitted with acute on chronic systolic heart failure. Will continue to follow for disposition and community follow up needs.    Our community based plan of care has focused on disease management care planning and are aware of patient's hospitalization.  Patient will receive a post hospital call and will be evaluated for assessments and disease process education.  Of note, [facility] Care Management services does not replace or interfere with any services that are needed or arranged by inpatient case management or social work.    For additional questions or referrals please contact:  Natividad Brood, RN BSN [facility] Liaison  [phone] business mobile phone Toll free office [phone]

## 2019-01-11 NOTE — Progress Notes (Signed)
[facility] Indication: atrial fibrillation  Allergies  Allergen Reactions  . Aldactone [Spironolactone] Other (See Comments)    Hyperkalemia  reported by Dr. [name] - pt is currently taking 12.5 mg daily -November 2017 per medication list by same MD    Patient Measurements: Height: 5\' 8"  (172.7 cm) Weight: 192 lb (87.1 kg)(scale b) IBW/kg (Calculated) : 68.4  Vital Signs: Temp: 98.1 F (36.7 C) (03/19 0727) Temp Source: Oral (03/19 0727) BP: 131/72 (03/19 0727) Pulse Rate: 73 (03/19 0815)  Labs: Recent Labs    01/08/19 1220  01/09/19 0409 01/10/19 0341 01/10/19 0956 01/11/19 0420  HGB 12.2*  --  10.8* 11.0*  --   --   HCT 39.3  --  32.9* 33.3*  --   --   PLT 222  --  202 218  --   --   LABPROT  --    < > 29.8*  --  20.9* 17.3*  INR  --    < > 2.9*  --  1.8* 1.4*  CREATININE 1.44*  --  1.22 1.08  --  1.17   < > = values in this interval not displayed.    Estimated Creatinine Clearance: 51.4 mL/min (by C-G formula based on SCr of 1.17 mg/dL).   Medical History: Past Medical History:  Diagnosis Date  . Atrial fibrillation ([place])    persistent, previously seen at [facility] and placed on amiodarone  . Benign prostatic hypertrophy   . CAD (coronary artery disease)    multivessel s/p inferolateral wall MI with subsequent CABG 11/1998.  Cath 2009 with Patent grafts  . Cleft palate   . COPD with emphysema ([place]) 04/01/2010  . DM (diabetes mellitus), type 2 ([place])   . Dyspnea   . GERD (gastroesophageal reflux disease)   . HTN (hypertension)   . Hyperlipidemia   . Hypothyroidism   . Iron deficiency anemia   . Ischemic dilated cardiomyopathy ([place])    EF 35-40% by MUGA 6/11  . Myocardial infarction ([place])   . Nasal septal deviation   . Nephrolithiasis   . OSA (obstructive sleep apnea)   . PAF (paroxysmal atrial fibrillation) ([place])   . Peripheral arterial disease ([facility])    left subclavian artery stenosis  . PNA  (pneumonia)   . Psoriasis   . Seborrheic keratosis   . Stroke ([place])   . Systolic congestive heart failure ([place]) 2009   s/p BiV ICD implantation by Dr Leonia Reeves (MDT)   Assessment: CC/HPI: Near syncope  PMH: Afib, CAD, CHF, EF 35-40%, ICM, OSA, COPD, hypothyroid, low back pain, BPH, cleft palate, DM, GERD, IDA, PVD, psoriasis, seborrheic keratosis, CVA, OSA, CKD3, neuropathy, PPM  Anticoag: Warf PTA. INR 2.9. Hgb only 12.2>10.8 with h/o IDA. INR down to 2.9>1.8>1.4 today after missing doses 3/16 and 3/17. CHADS2VASC=8 - PTA Coumadin: 5 mg daily, last dose 3/15  Goal of Therapy:  INR 2-3 Monitor platelets by anticoagulation protocol: Yes   Plan:  Daily INR Coumadin 10 mg po x 1 tonight. Overlap heparin?    Fidela Cieslak S. [place], PharmD, BCPS Clinical Staff Pharmacist Eilene Ghazi [place] 01/11/2019,8:46 AM

## 2019-01-11 NOTE — TOC Initial Note (Signed)
Transition of Care [facility]) - Initial/Assessment Note    Patient Details  Name: [name] MRN: [id number] Date of Birth: [birth year]  Transition of [facility]) CM/SW Contact:    Royston Bake, RN,MHA,BSN Phone Number:[phone] 01/11/2019, 1:55 PM  Clinical Narrative:                 Patient lives at home with spouse; continues to drive; PCP: [name], MD; has private insurance with [employer]; pharmacy of choice [facility]; patient is refusing all [facility] services at this time. Stated " I have done all that before and I know what to do." DME - 2 walker and 2 canes at home.  Expected Discharge Plan: Home/Self Care Barriers to Discharge: No Barriers Identified   Patient Goals and CMS Choice Patient states their goals for this hospitalization and ongoing recovery are:: to stay at home CMS Medicare.gov Compare Post Acute Care list provided to:: Patient Choice offered to / list presented to : Patient  Expected Discharge Plan and Services Expected Discharge Plan: Home/Self Care     Post Acute Care Choice: [place] arrangements for the past 2 months: [place]                   DME Agency: NA HH Arranged: Refused [place] Agency: NA  Prior Living Arrangements/Services Living arrangements for the past 2 months: Single Family Home Lives with:: Spouse Patient language and need for interpreter reviewed:: No Do you feel safe going back to the place where you live?: Yes      Need for Family Participation in Patient Care: No (Comment) Care giver support system in place?: Yes (comment)   Criminal Activity/Legal Involvement Pertinent to Current Situation/Hospitalization: No - Comment as needed  Activities of Daily Living Home Assistive Devices/Equipment: Bedside commode/3-in-1, Cane (specify quad or straight), Eyeglasses, Walker (specify type) ADL Screening (condition at time of admission) Patient's cognitive ability adequate to safely complete daily  activities?: Yes Is the patient deaf or have difficulty hearing?: Yes Does the patient have difficulty seeing, even when wearing glasses/contacts?: No Does the patient have difficulty concentrating, remembering, or making decisions?: No Patient able to express need for assistance with ADLs?: Yes Does the patient have difficulty dressing or bathing?: Yes Independently performs ADLs?: Yes (appropriate for developmental age) Does the patient have difficulty walking or climbing stairs?: Yes Weakness of Legs: Both Weakness of Arms/Hands: Both  Permission Sought/Granted Permission sought to share information with : Case Manager Permission granted to share information with : Yes, Verbal Permission Granted  Share Information with NAME: Wandra Arthurs Friis (Spouse)           Emotional Assessment Appearance:: Appears stated age Attitude/Demeanor/Rapport: Gracious, Engaged Affect (typically observed): Accepting Orientation: : Oriented to Self, Oriented to  Time, Oriented to Place, Oriented to Situation Alcohol / Substance Use: Not Applicable Psych Involvement: No (comment)  Admission diagnosis:  Dehydration [E86.0] Leukocytosis, unspecified type [D72.829] Patient Active Problem List   Diagnosis Date Noted  . Acute on chronic systolic CHF (congestive heart failure), NYHA class 4 ([place]) 01/08/2019  . CKD stage 3 due to type 2 diabetes mellitus ([place]) 01/08/2019  . Dehydration 01/08/2019  . Leukocytosis 01/08/2019  . Acute on chronic respiratory failure with hypoxia ([place]) 10/04/2018  . HCAP (healthcare-associated pneumonia) 09/17/2018  . Severe sepsis with septic shock ([place]) 07/13/2018  . Supratherapeutic INR 07/13/2018  . Hypoxia 07/13/2018  . Hypothyroidism 07/13/2018  . Pressure injury of skin 07/13/2018  . Community  acquired pneumonia 09/03/2016  . Acute respiratory failure ([place]) 09/03/2016  . SOB (shortness of breath)   . A-fib ([place]) 06/11/2016  . Atrial fibrillation, persistent   .  Allergic rhinitis 02/05/2016  . Long term (current) use of anticoagulants [Z79.01] 01/19/2016  . Chronic systolic heart failure ([place])   . Acute respiratory failure with hypoxia ([place]) 01/11/2016  . Acute on chronic systolic CHF (congestive heart failure), NYHA class 3 ([place]) 01/11/2016  . Demand ischemia ([place]) 01/11/2016  . Acute renal failure superimposed on stage 3 chronic kidney disease ([place]) 01/11/2016  . Diabetes mellitus with diabetic neuropathy, with long-term current use of insulin ([place]) 01/11/2016  . Morbid obesity due to excess calories ([place]) 01/11/2016  . COPD (chronic obstructive pulmonary disease) ([place]) 01/10/2016  . Coronary artery disease   . Ischemic dilated cardiomyopathy ([place])   . PAF (paroxysmal atrial fibrillation) ([place])   . OSA (obstructive sleep apnea)   . Peripheral vascular disease ([place]) 10/02/2013  . Long term current use of anticoagulant therapy 08/21/2013  . Essential hypertension 04/01/2010  . Automatic implantable cardioverter-defibrillator in situ 03/31/2010   PCP:  [name], MD Pharmacy:   [place], [place] - [address] [place] [address] Phone: [phone] Fax: [phone]  [facility] [place] [place] [address] [place] [address] Phone: [phone] Fax: [phone]     Social Determinants of Health (SDOH) Interventions    Readmission Risk Interventions No flowsheet data found.

## 2019-01-11 NOTE — Plan of Care (Signed)

## 2019-01-11 NOTE — Progress Notes (Signed)
SATURATION QUALIFICATIONS: (This note is used to comply with regulatory documentation for home oxygen)  Patient Saturations on Room Air at Rest = 80%  Patient Saturations on Room Air while Ambulating = NT as pt desats on RA.   Patient Saturations on 3 Liters of oxygen while Ambulating = 90%  Please briefly explain why patient needs home oxygen:Pt needed 3LO2 with activity and 2.5 L at rest to keep sats >90%.   [place]rian Pager:  [phone]  Office:  [phone]

## 2019-01-12 DIAGNOSIS — Z794 Long term (current) use of insulin: Secondary | ICD-10-CM

## 2019-01-12 DIAGNOSIS — I2510 Atherosclerotic heart disease of native coronary artery without angina pectoris: Secondary | ICD-10-CM

## 2019-01-12 DIAGNOSIS — Z95810 Presence of automatic (implantable) cardiac defibrillator: Secondary | ICD-10-CM

## 2019-01-12 DIAGNOSIS — I480 Paroxysmal atrial fibrillation: Secondary | ICD-10-CM

## 2019-01-12 DIAGNOSIS — Z7901 Long term (current) use of anticoagulants: Secondary | ICD-10-CM

## 2019-01-12 DIAGNOSIS — I10 Essential (primary) hypertension: Secondary | ICD-10-CM

## 2019-01-12 DIAGNOSIS — G4733 Obstructive sleep apnea (adult) (pediatric): Secondary | ICD-10-CM

## 2019-01-12 DIAGNOSIS — N183 Chronic kidney disease, stage 3 (moderate): Secondary | ICD-10-CM

## 2019-01-12 DIAGNOSIS — T82110S Breakdown (mechanical) of cardiac electrode, sequela: Secondary | ICD-10-CM

## 2019-01-12 DIAGNOSIS — I5023 Acute on chronic systolic (congestive) heart failure: Secondary | ICD-10-CM

## 2019-01-12 DIAGNOSIS — E1140 Type 2 diabetes mellitus with diabetic neuropathy, unspecified: Secondary | ICD-10-CM

## 2019-01-12 DIAGNOSIS — E1122 Type 2 diabetes mellitus with diabetic chronic kidney disease: Secondary | ICD-10-CM

## 2019-01-12 LAB — BASIC METABOLIC PANEL
Anion gap: 12 (ref 5–15)
BUN: 22 mg/dL (ref 8–23)
CO2: 27 mmol/L (ref 22–32)
Calcium: 8.7 mg/dL — ABNORMAL LOW (ref 8.9–10.3)
Chloride: 99 mmol/L (ref 98–111)
Creatinine, Ser: 1.35 mg/dL — ABNORMAL HIGH (ref 0.61–1.24)
GFR calc Af Amer: 56 mL/min — ABNORMAL LOW (ref >60)
GFR calc non Af Amer: 48 mL/min — ABNORMAL LOW (ref >60)
Glucose, Bld: 130 mg/dL — ABNORMAL HIGH (ref 70–99)
Potassium: 3.6 mmol/L (ref 3.5–5.1)
Sodium: 138 mmol/L (ref 135–145)

## 2019-01-12 LAB — PROTIME-INR
INR: 1.9 — ABNORMAL HIGH (ref 0.8–1.2)
Prothrombin Time: 21.1 seconds — ABNORMAL HIGH (ref 11.4–15.2)

## 2019-01-12 LAB — GLUCOSE, CAPILLARY
Glucose-Capillary: 163 mg/dL — ABNORMAL HIGH (ref 70–99)
Glucose-Capillary: 203 mg/dL — ABNORMAL HIGH (ref 70–99)
Glucose-Capillary: 176 mg/dL — ABNORMAL HIGH (ref 70–99)
Glucose-Capillary: 126 mg/dL — ABNORMAL HIGH (ref 70–99)
Glucose-Capillary: 148 mg/dL — ABNORMAL HIGH (ref 70–99)

## 2019-01-12 MED ORDER — FUROSEMIDE 40 MG PO TABS
40.0000 mg | ORAL_TABLET | Freq: Every day | ORAL | Status: DC
  Administered 2019-01-13 – 2019-01-14 (×2): 40 mg via ORAL
  Filled 2019-01-12 (×2): qty 1

## 2019-01-12 MED ORDER — POTASSIUM CHLORIDE CRYS ER 20 MEQ PO TBCR
40.0000 meq | EXTENDED_RELEASE_TABLET | Freq: Once | ORAL | Status: AC
  Administered 2019-01-12: 40 meq via ORAL
  Filled 2019-01-12: qty 2

## 2019-01-12 MED ORDER — WARFARIN SODIUM 5 MG PO TABS
5.0000 mg | ORAL_TABLET | Freq: Every day | ORAL | Status: DC
  Administered 2019-01-12: 5 mg via ORAL
  Filled 2019-01-12: qty 1

## 2019-01-12 MED ORDER — CYCLOBENZAPRINE HCL 5 MG PO TABS
5.0000 mg | ORAL_TABLET | Freq: Three times a day (TID) | ORAL | Status: DC
  Administered 2019-01-12 – 2019-01-14 (×6): 5 mg via ORAL
  Filled 2019-01-12 (×6): qty 1

## 2019-01-12 NOTE — Progress Notes (Signed)
PT Cancellation Note  Patient Details Name: [name] MRN: [id number] DOB: [birth year]   Cancelled Treatment:    Reason Eval/Treat Not Completed: Medical issues which prohibited therapy. RN requesting hold PT treatment. Awaiting MD to address pacemaker.   Mabeline Caras, PT, DPT Acute Rehabilitation Services  Pager [phone] Office [place] 01/12/2019, 11:36 AM

## 2019-01-12 NOTE — Progress Notes (Signed)
Respiratory therapy and patient called me regarding a siren sound coming from patients pacemaker that lasted for around a minute, per pt and RT report. When assessing pt I did not hear the sound. Pt reports hearing this siren sound six years ago and doctors came saw him at home. Pt is asymptomatic, talking and watching television. Called CCMD for any abnormalities in monitor. CCMD reports not seeing anything different from baseline.  Will page Darrick Meigs  MD.

## 2019-01-12 NOTE — Progress Notes (Signed)
Patient ambulated in the hallway with nurse tech.  According to nurse tech; had to stop at the room door because of oxygen saturation dropped to 88% and waited for pt to catch his breath. While walking, oxygen saturation would alternate between 88%-90%. Patient is currently sitting in his chair on 3L nasal canula. Spouse at bedside.

## 2019-01-12 NOTE — Care Management Important Message (Signed)
Important Message  Patient Details  Name: [name] MRN: [id number] Date of Birth: 1934-10-12   Medicare Important Message Given:  Yes    Ninnie Fein 01/12/2019, 3:26 PM

## 2019-01-12 NOTE — Progress Notes (Signed)
EKG done , printed and placed on pts chart.

## 2019-01-12 NOTE — Progress Notes (Signed)
pts wife stated that :"the pacemaker went off around 2am and again at 4am".  Patient reports pain on the LU chest side when coughing, upon assessment it hurts when touched.  Lama MD present. Patient reports feeling comfort with 3L on nasal canula.  Wife is at bedside. Holding medications for now per MD orders.

## 2019-01-12 NOTE — Plan of Care (Signed)
Discussed with patient plan of care for the evening, pain management and left flank pain with some teach back displayed

## 2019-01-12 NOTE — Consult Note (Addendum)
Cardiology Consultation:   Patient ID: [name]; [id number]; 19-Sep-1934   Admit date: 01/08/2019 Date of Consult: 01/12/2019  Primary Care Provider: [name], MD Primary Cardiologist: Larae Grooms, MD 10/02/2018 Primary Electrophysiologist:  Thompson Grayer, MD   Patient Profile:   [name] is a 85 y.o. male with a hx of history of COPD on CPAP, left subclavian stenosis, CHF ( systolic, EF 95-09% in 32/6712), Afib s/p ablation w/ recurrence, ICM s/p MDT biventricular defibrillator (AICD is off), CAD s/p CABG, hypothyroidism, HTN, CVA, chronic coumadin, DNR, who is being seen today for the evaluation of CHF at the request of Dr Darrick Meigs.  History of Present Illness:   [name] was admitted 03/16 w/ near-syncope. He was felt to be dehydrated and got IVF in the ER. Procalcitonin was low>>no sepsis. Also felt to have acute CHF. He was started on IV Lasix. Pt continued to have problems w/ desat, worse w/ ambulation. Wt on admission 196>>198>>196>>192>>189. Prev dry wt was 187-196.   Pt device was alarming, EP NP evaluated and tone was 2nd device being unable to transmit, impedance unchanged (RV lead fx>>impedance > 3000).  Today, pt ambulated in the halls and O2 sats decreased to 88%, associated w/ sig SOB. Cards asked to see.   [name] has a routine where he gets up and weighs himself every morning.  His weight has been running between 189 and 191 pounds.  His wife does the cooking and watches the salt very carefully.  However, he may be taking in too much fluid as he has not been watching that.  He states he keeps a glass of water about the chair and drinks all day long.  He also has a Dr. Malachi Bonds every day.  Since he was admitted with a near syncope, he has not had any additional episodes.  He has not had any chest pain.  His shortness of breath has improved with diuresis.  He is now back to his previous dry weight, but states his shortness of breath is not  back to baseline yet.  However, his baseline was poor.  He states he uses a Radiation protection practitioner at home for ambulation.  But even so, he cannot walk 75 feet without being "give out".  He does not get chest pain with this, just shortness of breath.  He has not been aware of any volume overload, has not had lower extremity edema, stable weights and felt his dyspnea on exertion was at baseline prior to admission.[name]  He does not have palpitations.  He is AV pacing 52.6% of the time and V pacing a total of 93.6% of the time.   Past Medical History:  Diagnosis Date   Atrial fibrillation ([place])    persistent, previously seen at [facility] and placed on amiodarone   Benign prostatic hypertrophy    CAD (coronary artery disease)    multivessel s/p inferolateral wall MI with subsequent CABG 11/1998.  Cath 2009 with Patent grafts   Cleft palate    COPD with emphysema ([place]) 04/01/2010   DM (diabetes mellitus), type 2 (HCC)    Dyspnea    GERD (gastroesophageal reflux disease)    HTN (hypertension)    Hyperlipidemia    Hypothyroidism    Iron deficiency anemia    Ischemic dilated cardiomyopathy (HCC)    EF 35-40% by MUGA 6/11   Myocardial infarction ([place])    Nasal septal deviation    Nephrolithiasis    OSA (obstructive sleep apnea)    PAF (paroxysmal  atrial fibrillation) (HCC)    Peripheral arterial disease (HCC)    left subclavian artery stenosis   PNA (pneumonia)    Psoriasis    Seborrheic keratosis    Stroke (HCC)    Systolic congestive heart failure ([place]) 2009   s/p BiV ICD implantation by Dr Leonia Reeves (MDT)    Past Surgical History:  Procedure Laterality Date   BI-VENTRICULAR IMPLANTABLE CARDIOVERTER DEFIBRILLATOR  (CRT-D)  10-08-08; 11-06-2013   Dr Leonia Reeves (MDT) implant for primary prevention; gen change to MDT VivaXT CRTD by Dr Rayann Heman   BIV ICD [facility] CHANGE OUT N/A 11/06/2013   Procedure: BIV ICD GENERTAOR CHANGE OUT;  Surgeon: Coralyn Mark, MD;  Location: [facility] CATH LAB;   Service: Cardiovascular;  Laterality: N/A;   c-spine surgery     CARDIOVERSION N/A 04/29/2016   Procedure: CARDIOVERSION;  Surgeon: Pixie Casino, MD;  Location: [facility] ENDOSCOPY;  Service: Cardiovascular;  Laterality: N/A;   CARPAL TUNNEL RELEASE     CATARACT EXTRACTION     CORONARY ARTERY BYPASS GRAFT     LIMA to LAD, SVG to OM, SVG to diagonal   ELECTROPHYSIOLOGIC STUDY N/A 06/11/2016   Procedure: Atrial Fibrillation Ablation;  Surgeon: Thompson Grayer, MD;  Location: [facility] CV LAB;  Service: Cardiovascular;  Laterality: N/A;   left cleft palate and left cleft lip repair       Prior to Admission medications   Medication Sig Start Date End Date Taking? Authorizing Provider  acetaminophen (TYLENOL) 500 MG tablet Take 1,000 mg by mouth at bedtime as needed for mild pain or moderate pain.   Yes [provider]  albuterol (PROVENTIL HFA;VENTOLIN HFA) 108 (90 BASE) MCG/ACT inhaler Inhale 2 puffs into the lungs every 6 (six) hours as needed for wheezing or shortness of breath. Must keep June 2016 appt. 03/04/15  Yes Chesley Mires, MD  albuterol (PROVENTIL) (2.5 MG/3ML) 0.083% nebulizer solution USE 1 VIAL VIA NEBULIZER 3 TIMES A DAY AS DIRECTED. DX:J44.9 Patient taking differently: Take 2.5 mg by nebulization daily.  08/21/18  Yes Chesley Mires, MD  amiodarone (PACERONE) 400 MG tablet TAKE HALF TABLET (200 MG TOTAL) BY MOUTH DAILY. Patient taking differently: Take 200 mg by mouth every morning.  02/21/18  Yes Allred, Jeneen Rinks, MD  atorvastatin (LIPITOR) 80 MG tablet TAKE 1 TABLET (80 MG TOTAL) BY MOUTH DAILY. Patient taking differently: Take 80 mg by mouth every morning.  07/10/18  Yes Jettie Booze, MD  bisacodyl (DULCOLAX) 5 MG EC tablet Take 2 tablets (10 mg total) by mouth daily at 12 noon. Patient taking differently: Take 10 mg by mouth daily as needed for mild constipation.  09/06/16  Yes Regalado, Belkys A, MD  carvedilol (COREG) 6.25 MG tablet Take 0.5 tablets (3.125 mg  total) by mouth 2 (two) times daily. Take if systolic blood pressure >595 Patient taking differently: Take 3.125 mg by mouth See admin instructions. Take if systolic blood pressure >638 take 3.125 mg and if below he does not take any thing 10/02/18  Yes Jettie Booze, MD  cholecalciferol (VITAMIN D) 1000 units tablet Take 2,000 Units by mouth every evening.   Yes [provider]  dextromethorphan-guaiFENesin (MUCINEX DM) 30-600 MG 12hr tablet Take 1 tablet by mouth 2 (two) times daily as needed for cough.   Yes [provider]  feeding supplement, ENSURE ENLIVE, (ENSURE ENLIVE) LIQD Take 237 mLs by mouth 2 (two) times daily between meals. 10/07/18  Yes Ghimire, Henreitta Leber, MD  ferrous sulfate 325 (65 FE) MG  tablet Take 325 mg by mouth every evening.    Yes [provider]  fluticasone (FLONASE) 50 MCG/ACT nasal spray SPRAY 2 SPRAYS INTO EACH NOSTRIL EVERY DAY Patient taking differently: Place 2 sprays into both nostrils every morning.  08/16/18  Yes Chesley Mires, MD  furosemide (LASIX) 40 MG tablet Take 1 tablet (40 mg total) by mouth daily. Patient taking differently: Take 40 mg by mouth 2 (two) times daily. Changed to 40 mg twice a day by Dr Beather Arbour on 10/12/18 10/02/18  Yes Jettie Booze, MD  gabapentin (NEURONTIN) 100 MG capsule Take 100 mg by mouth 3 (three) times daily.   Yes [provider]  Insulin Degludec-Liraglutide (XULTOPHY) 100-3.6 UNIT-MG/ML SOPN Inject 16 Units into the skin every morning.   Yes [provider]  levothyroxine (SYNTHROID, LEVOTHROID) 100 MCG tablet Take 100 mcg by mouth daily before breakfast.  09/23/15  Yes [provider]  Multiple Vitamins-Minerals (PRESERVISION AREDS PO) Take 1 tablet by mouth 2 (two) times daily.   Yes [provider]  potassium chloride 20 MEQ TBCR Take 20 mEq by mouth daily. 07/15/18  Yes Thurnell Lose, MD  PRESCRIPTION MEDICATION Inhale into the lungs at bedtime. CPAP    Yes [provider]  senna (SENOKOT) 8.6 MG tablet Take 1 tablet by mouth 2 (two) times daily.    Yes [provider]  traMADol (ULTRAM) 50 MG tablet Take 50 mg by mouth at bedtime as needed for moderate pain or severe pain.  06/07/18  Yes [provider]  TRELEGY ELLIPTA 100-62.5-25 MCG/INH AEPB INHALE 1 PUFF INTO LUNGS ONCE A DAY Patient taking differently: Take 1 puff by mouth every morning.  07/06/18  Yes Chesley Mires, MD  vitamin B-12 (CYANOCOBALAMIN) 1000 MCG tablet Take 1,000 mcg by mouth daily.   Yes [provider]  warfarin (COUMADIN) 5 MG tablet TAKE AS DIRECTED BY COUMADIN CLINIC Patient taking differently: Take 5 mg by mouth daily.  03/31/18  Yes Jettie Booze, MD    Inpatient Medications: Scheduled Meds:  albuterol  2.5 mg Nebulization Daily   amiodarone  200 mg Oral Daily   atorvastatin  80 mg Oral q1800   umeclidinium-vilanterol  1 puff Inhalation Daily   And   budesonide (PULMICORT) nebulizer solution  0.25 mg Nebulization BID   carvedilol  3.125 mg Oral BID WC   cyclobenzaprine  5 mg Oral TID   ferrous sulfate  325 mg Oral QPM   fluticasone  2 spray Each Nare Daily   [START ON 01/13/2019] furosemide  40 mg Oral Daily   insulin aspart  0-15 Units Subcutaneous TID WC   levothyroxine  100 mcg Oral QAC breakfast   potassium chloride  40 mEq Oral Once   senna  1 tablet Oral BID   sodium chloride flush  3 mL Intravenous Q12H   warfarin  5 mg Oral q1800   Warfarin - Pharmacist Dosing Inpatient   Does not apply q1800   Continuous Infusions:  PRN Meds: acetaminophen **OR** acetaminophen, ipratropium-albuterol, ondansetron **OR** ondansetron (ZOFRAN) IV, oxyCODONE, traMADol  Allergies:    Allergies  Allergen Reactions   Aldactone [Spironolactone] Other (See Comments)    Hyperkalemia  reported by Dr. 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 on file   Food insecurity:    Worry: Not on file    Inability: Not on file   Transportation needs:    Medical: Not on file    Non-medical: Not on file  Tobacco Use   Smoking status: Former Smoker    Packs/day: 3.00    Years: 65.00    Pack years: 195.00    Types: Cigarettes    Last attempt to quit: 10/25/1998    Years since quitting: 20.2   Smokeless tobacco: Never Used  Substance and Sexual Activity   Alcohol use: No    Alcohol/week: 0.0 standard drinks    Comment: remote history of heavy alcohol use   Drug use: No   Sexual activity: Not on file  Lifestyle   Physical activity:    Days per week: Not on file    Minutes per session: Not on file   Stress: Not on file  Relationships   Social connections:    Talks on phone: Not on file    Gets together: Not on file    Attends religious service: Not on file    Active member of club or organization: Not on file    Attends meetings of clubs or organizations: Not on file    Relationship status: Not on file   Intimate partner violence:    Fear of current or ex partner: Not on file    Emotionally abused: Not on file    Physically abused: Not on file    Forced sexual activity: Not on file  Other Topics Concern   Not on file  Social History Narrative   Lives [place]   Retired    Family History:   Family History  Problem Relation Age of Onset   Asthma Father    Stroke Father    Hypertension Father    Hypertension Mother    Heart disease Brother    Heart attack Brother    Hypertension Sister    Hypertension Brother    Family Status:  Family Status  Relation  Name Status   Father  Deceased   Mother  Deceased   Brother  (Not Specified)   Brother  (Not Specified)   Sister  (Not Specified)   Brother  (Not Specified)   MGM  Deceased   MGF  Deceased   PGM  Deceased   PGF  Deceased    ROS:  Please see the history of present illness.  All other ROS reviewed and negative.     Physical Exam/Data:   Vitals:   01/12/19 0749 01/12/19 0756 01/12/19 0812 01/12/19 0815  BP:   (!) 96/56 (!) 91/57  Pulse:   70 70  Resp:   (!) 24   Temp:   97.9 F (36.6 C)   TempSrc:   Oral   SpO2: 98% 98% 93% 94%  Weight:      Height:        Intake/Output Summary (Last 24 hours) at 01/12/2019 1606 Last data filed at 01/12/2019 1221 Gross per 24 hour  Intake 840 ml  Output 1600 ml  Net -760 ml   Filed Weights   01/10/19 0456 01/11/19 0413 01/12/19 0536  Weight: 88.9 kg 87.1 kg 85.7 kg   Body mass index is  28.74 kg/m.  General:  Well nourished, well developed, elderly male, in no acute distress HEENT: normal Lymph: no adenopathy Neck: Minimal JVD Endocrine:  No thryomegaly Vascular: No carotid bruits; upper extremity pulses 2+, lower extremity pulses 1+ Cardiac:  normal S1, S2; RRR; no murmur  Lungs: Rales bases bilaterally, no wheezing, rhonchi  Abd: soft, nontender, no hepatomegaly  Ext: no edema Musculoskeletal:  No deformities, BUE and BLE strength normal and equal Skin: warm and dry  Neuro:  CNs 2-12 intact, no focal abnormalities noted Psych:  Normal affect   EKG:  The EKG was personally reviewed and demonstrates: 3/20 ECG is AV paced, heart rate 70 3/16 ECG is V paced, do not believe a paced, heart rate 79 Telemetry:  Telemetry was personally reviewed and demonstrates: AV pacing  Relevant CV Studies:  ECHO: 09/19/2018 - Left ventricle: The cavity size was severely dilated. Systolic   function was moderately to severely reduced. The estimated   ejection fraction was in the range of 30% to 35%. Severe diffuse   hypokinesis  with distinct regional wall motion abnormalities.   There is severe hypokinesis to akinesis of the inferolateral and   inferior myocardium. There is hypokinesis of the lateral   myocardium. There is akinesis of the apical septal, apical   anterior and apical myocardium. - Aortic valve: Mildly to moderately calcified annulus. Trileaflet;   normal thickness, moderately calcified leaflets. Valve area   (VTI): 2.55 cm^2. Valve area (Vmax): 2.7 cm^2. Valve area   (Vmean): 2.13 cm^2. - Aorta: Aortic root dimension: 39 mm (ED). - Aortic root: The aortic root was mildly dilated. - Mitral valve: There was mild regurgitation. Valve area by   continuity equation (using LVOT flow): 2.29 cm^2. - Left atrium: The atrium was severely dilated. - Right atrium: The atrium was mildly dilated. - Pulmonic valve: There was trivial regurgitation. - Pulmonary arteries: PA peak pressure: 45 mm Hg (S).  Impressions:  - The right ventricular systolic pressure was increased consistent   with moderate pulmonary hypertension.  CATH: 2009 RESULTS:  1. The left main coronary artery is heavily calcified and the calcium      extends down into the left circumflex and left anterior descending      arteries.  The left main coronary artery is occluded proximally.   The saphenous vein graft to the obtuse marginal branch is large and  widely patent.  There is evidence of retrograde flow up into the  circumflex artery.  The obtuse marginal branch is widely patent  throughout its course.  The left internal mammary artery to the LAD is a  small graft, but is widely patent and inserts into the LAD distally.  There is no evidence of any obstructive disease in the LIMA graft itself  and the LAD distally is small, but widely patent.  There is evidence of  competitive flow into the mid LAD.  The left ventriculography revealed  markedly dilated left ventricular cavity dimensions with severe LV  dysfunction, EF 20-25%.   There does appear to be some mild mitral  regurgitation associated with this.   Aortic root angiography showed an anterior takeoff of the right coronary  artery, possibly off the left coronary cusp.   The right coronary artery is widely patent and distally bifurcates into  a posterior descending artery and posterolateral artery, both of which  are widely patent.  There is calcification of the RCA in the proximal  portion.   The saphenous vein graft to the  diagonal branch is widely patent.  Proximal to the insertion of the graft into the diagonal branch, there  is about 80% stenosis within the diagonal branch.  Distal to the  insertion of the saphenous vein graft to the diagonal, there is no  evidence of disease.  There is evidence of retrograde flow from the  diagonal into the LAD and the distal LAD is visible with evidence of  competitive flow.   ASSESSMENT:  1. Occluded left main which is heavily calcified extending into the      left anterior descending artery and left circumflex artery.  2. Saphenous vein graft to the obtuse marginal widely patent.  3. Saphenous vein graft the diagonal branch widely patent with      evidence of flow back into the left anterior descending artery and      distally, there is evidence of competitive flow in the distal left      anterior descending into the left internal mammary artery graft.  4. Patent right coronary artery.  5. Markedly dilated left ventricular with severe left ventricular      dysfunction at 20%.  Laboratory Data:  Chemistry Recent Labs  Lab 01/10/19 0341 01/11/19 0420 01/12/19 0507  NA 137 135 138  K 3.9 3.8 3.6  CL 102 96* 99  CO2 27 28 27   GLUCOSE 133* 146* 130*  BUN 21 19 22   CREATININE 1.08 1.17 1.35*  CALCIUM 8.6* 8.9 8.7*  GFRNONAA >60 57* 48*  GFRAA >60 >60 56*  ANIONGAP 8 11 12     Lab Results  Component Value Date   ALT 16 01/08/2019   AST 32 01/08/2019   ALKPHOS 77 01/08/2019   BILITOT 0.6  01/08/2019   Hematology Recent Labs  Lab 01/08/19 1220 01/09/19 0409 01/10/19 0341  WBC 24.5* 15.3* 11.3*  RBC 4.17* 3.48* 3.61*  HGB 12.2* 10.8* 11.0*  HCT 39.3 32.9* 33.3*  MCV 94.2 94.5 92.2  MCH 29.3 31.0 30.5  MCHC 31.0 32.8 33.0  RDW 13.8 14.3 13.7  PLT 222 202 218   BNP Recent Labs  Lab 01/08/19 1220 01/09/19 0409  BNP 638.9* 569.8*    TSH:  Lab Results  Component Value Date   TSH 0.454 07/13/2018   Lipids: Lab Results  Component Value Date   CHOL 137 11/12/2014   HDL 50 11/12/2014   LDLCALC 68 11/12/2014   TRIG 95 11/12/2014   CHOLHDL 2.6 11/29/2008   HgbA1c: Lab Results  Component Value Date   HGBA1C 6.5 (H) 09/20/2018   Magnesium:  Magnesium  Date Value Ref Range Status  09/20/2018 2.1 1.7 - 2.4 mg/dL Final    Comment:    Performed at [facility], [place] [address], [place], [place] [address]     Radiology/Studies:  Dg Chest 2 View  Result Date: 01/10/2019 CLINICAL DATA:  Shortness of breath EXAM: CHEST - 2 VIEW COMPARISON:  January 08, 2019 FINDINGS: There is a persistent right pleural effusion. There is underlying interstitial edema. There is airspace consolidation in both lower lobes. There is cardiomegaly with mild pulmonary venous hypertension. Pacemaker leads are attached to the right atrium and right ventricle, stable. Patient is status post coronary artery bypass grafting. Bones appear osteoporotic. IMPRESSION: Pulmonary vascular congestion. Right pleural effusion. Consolidation in both lower lobes, a finding that may be due to either pneumonia or alveolar edema. Both entities may well be present concurrently. The overall appearance is felt to be indicative of a degree of congestive heart failure. Appearance  is essentially stable compared to 2 days prior. Electronically Signed   By: Lowella Grip III M.D.   On: 01/10/2019 07:43    Assessment and Plan:   1.  Acute on chronic systolic CHF: - Although he is at his dry weight, he does  not feel that his breathing is back to baseline and his oxygen saturations are dropping when he ambulates - His activity level was poor at baseline, I am suspicious that his oxygen levels were dropping with ambulation, even on a good day. - He has diuresed well on Lasix 40 mg IV twice daily. - he is not had IV Lasix today, his BUN and creatinine are trending up -He is encouraged to watch his fluid intake as well as his sodium intake and continue daily weights at home. -Discuss with MD if we should try a additional diuresis, or continue O2 with a PT eval (ordered) and see if increased mobility will get him back to baseline  2.  PAF s/p MDT AICD -On interrogation from 09/2018, he had some atrial fibrillation. -From December through 3/16, he had a total of 11 days of atrial fibrillation. -From 3/16-3/20, no A. fib -Not sure if the CHF was related to the atrial fibrillation, but he is not in it now. -Currently AV pacing  3.  Near syncope felt secondary to dehydration - On arrival, initial blood pressure lying 99/55 and 118/59 sitting, but he was unable to stand due to weakness.  Otherwise, per IM Principal Problem:   Dehydration Active Problems:   Essential hypertension   Automatic implantable cardioverter-defibrillator in situ   Long term current use of anticoagulant therapy   Coronary artery disease   PAF (paroxysmal atrial fibrillation) (HCC)   OSA (obstructive sleep apnea)   Diabetes mellitus with diabetic neuropathy, with long-term current use of insulin (HCC)   Morbid obesity due to excess calories (HCC)   Acute on chronic systolic CHF (congestive heart failure), NYHA class 4 ([place])   CKD stage 3 due to type 2 diabetes mellitus (HCC)   Leukocytosis     For questions or updates, please contact [facility] Please consult [email or web address] for contact info under Cardiology/STEMI.   Signed, Rosaria Ferries, PA-C  01/12/2019 4:06 PM   I have seen and examined the patient along  with Rosaria Ferries, PA-C .  I have reviewed the chart, notes and new data.  I agree with PA/NP's note.  Key new complaints: not back to "best recent" exertional tolerance, but denies dizziness or presyncope. Tires easily. No angina, no orthopnea. Key examination changes: clear lungs, no overt JVD or edema; obese Key new findings / data:  - ECG today show AV sequential paced rhythm with broad paced QRS, QS pattern in V1-V2, different from ECG earlier in 2019 which showed paced rhythm with prominent R wave V1-V2. Interestingly, ECG from 21/08/2018 shows a non-paced narrower QRS (has VSRP triggered pacing with pseudofusion). And ECG from 01/08/2019 appears to show a paced QRS with very prominent R in V1-V2 and a relatively narrow QRS. There are problems with RV bipolar impedance. - Optivol shows significant drop in thoracic impedance in Dec-jan , coinciding with HF exacerbation, some volatility after that, but now probably at baseline. - Likewise, BUN and creatinine have varied a bit recently, but are probably at baseline.  PLAN: I believe he is as close to optimal volume as we can get him, but I worry that his deterioration in exercise tolerance and desaturation with exercise may be related to  loss of optimal CRT. Will ask EP to take another look at his settings tomorrow to see if these can be optimized. Deconditioning and advanced age/frailty are clearly also a big part of his problems.  Sanda Klein, MD, [place] [phone] 01/12/2019, 6:13 PM

## 2019-01-12 NOTE — Progress Notes (Signed)
Asked to evaluate device 2/2 tone In review of the patient's chart: He has had device toning historically for inability to transmit. He is known RV lead fracture - has stable impedance, sensing, threshold RV tip to RV coil.   Bipolar impedance >3000 ohms.   Tachy therapies disabled  Today's device interrogation Note that he had a carelink transmission 01/07/18 (likely done in the ER) Tone was 2/2 the device being unable to transmit (was due for a transmission yesterday) Alert noted for RV bipolar impedance >3000 is known for the patient Lead measurements done today (auto) were all stable when compared to last in-office, and in review of trends all appear stable. Battery longevity is estimated at 12 months  No reprogramming was done  Tommye Standard, PA-C

## 2019-01-12 NOTE — Progress Notes (Signed)
Spoke with [place] MD.  Patient is alert and oriented to self,place,and situation. Patient is currently sitting in chair, just finished breakfast and watching tv.changed O2 flow rate to 3L.  Patient denies chest pain and shortness of breath.  Vitals are as follow:   01/12/19 0812  Vitals  Temp 97.9 F (36.6 C)  Temp Source Oral  BP (!) 96/56  MAP (mmHg) 67  BP Location Right Arm  BP Method Automatic  Patient Position (if appropriate) Sitting  Pulse Rate 70  Pulse Rate Source Monitor  Resp (!) 24  Oxygen Therapy  SpO2 93 %  O2 Device Nasal Cannula  O2 Flow Rate (L/min) 2 L/min  MEWS Score  MEWS RR 1  MEWS Pulse 0  MEWS Systolic 1  MEWS LOC 0  MEWS Temp 0  MEWS Score 2  MEWS Score Color Yellow

## 2019-01-12 NOTE — Progress Notes (Signed)
Wife reports that patient's pacemaker was making a noise that lasted for at least a minute. Patient is currently sitting in chair denies pain.

## 2019-01-12 NOTE — Progress Notes (Signed)
[place] for Coumadin Indication: atrial fibrillation  Allergies  Allergen Reactions  . Aldactone [Spironolactone] Other (See Comments)    Hyperkalemia  reported by Dr. [name] - pt is currently taking 12.5 mg daily -November 2017 per medication list by same MD    Patient Measurements: Height: 5\' 8"  (172.7 cm) Weight: 189 lb (85.7 kg) IBW/kg (Calculated) : 68.4  Vital Signs: Temp: 97.9 F (36.6 C) (03/20 0812) Temp Source: Oral (03/20 0812) BP: 91/57 (03/20 0815) Pulse Rate: 70 (03/20 0815)  Labs: Recent Labs    01/10/19 0341 01/10/19 0956 01/11/19 0420 01/12/19 0507  HGB 11.0*  --   --   --   HCT 33.3*  --   --   --   PLT 218  --   --   --   LABPROT  --  20.9* 17.3* 21.1*  INR  --  1.8* 1.4* 1.9*  CREATININE 1.08  --  1.17 1.35*    Estimated Creatinine Clearance: 44.2 mL/min (A) (by C-G formula based on SCr of 1.35 mg/dL (H)).   Medical History: Past Medical History:  Diagnosis Date  . Atrial fibrillation ([place])    persistent, previously seen at [facility] and placed on amiodarone  . Benign prostatic hypertrophy   . CAD (coronary artery disease)    multivessel s/p inferolateral wall MI with subsequent CABG 11/1998.  Cath 2009 with Patent grafts  . Cleft palate   . COPD with emphysema ([place]) 04/01/2010  . DM (diabetes mellitus), type 2 ([place])   . Dyspnea   . GERD (gastroesophageal reflux disease)   . HTN (hypertension)   . Hyperlipidemia   . Hypothyroidism   . Iron deficiency anemia   . Ischemic dilated cardiomyopathy ([place])    EF 35-40% by MUGA 6/11  . Myocardial infarction ([place])   . Nasal septal deviation   . Nephrolithiasis   . OSA (obstructive sleep apnea)   . PAF (paroxysmal atrial fibrillation) ([place])   . Peripheral arterial disease ([facility])    left subclavian artery stenosis  . PNA (pneumonia)   . Psoriasis   . Seborrheic keratosis   . Stroke ([place])   . Systolic congestive heart failure ([place]) 2009   s/p BiV ICD  implantation by Dr Leonia Reeves (MDT)   Assessment: CC/HPI: Near syncope  PMH: Afib, CAD, CHF, EF 35-40%, ICM, OSA, COPD, hypothyroid, low back pain, BPH, cleft palate, DM, GERD, IDA, PVD, psoriasis, seborrheic keratosis, CVA, OSA, CKD3, neuropathy, PPM  Anticoag: Warf PTA. INR 2.9. Hgb only 12.2>10.8 with h/o IDA. INR down to 2.9>1.8>1.4>1.9 today after missing doses 3/16 and 3/17. CHADS2VASC=8 - PTA Coumadin: 5 mg daily, last dose 3/15  Goal of Therapy:  INR 2-3 Monitor platelets by anticoagulation protocol: Yes   Plan:  Daily INR Resume Coumadin 5mg  daily. Would pt benefit from ACE/ARB?    Namiah Dunnavant S. Alford Highland, PharmD, BCPS Clinical Staff Pharmacist Eilene Ghazi Stillinger 01/12/2019,9:28 AM

## 2019-01-12 NOTE — Progress Notes (Signed)
Pt back in bed.

## 2019-01-12 NOTE — Progress Notes (Signed)
[facility]  PROGRESS NOTE  [name]:[id number] DOB: 12/31/1933 DOA: 01/08/2019 PCP: [name], MD   Brief HPI:   85 year old male with a history of atrial fibrillation, CAD, chronic systolic heart failure, COPD, sleep apnea, hypothyroidism presented to the hospital with near syncope.  Patient says he did not lose consciousness.    Subjective   Patient seen and examined, complains of left chest wall pain from coughing and turning.   Assessment/Plan:     1. ?  Near syncope-patient denies losing consciousness.  He does have a history of ischemic cardiomyopathy status post CABG with LV function 30 to 35% with severe diffuse hypokinesis.  Continue telemetry monitoring.  2. Chest wall pain/muscular-tenderness to palpation on left lower rib cage, start Flexeril 5 mg p.o. 3 times daily.  3. Acute on chronic systolic CHF-chest x-ray shows bilateral pulmonary edema, no signs and symptoms of pneumonia.  Patient was started on IV Lasix and has diuresed, is net -4.1 L Will hold Lasix due to hypotension.  Start Lasix 40 mg p.o. daily from tomorrow morning.  Will repeat chest x-ray in a.m.  4. Hypertension-orthostatic vital signs were negative for postural hypotension.  Continue Coreg.  5. Diabetes mellitus type 2-continue sliding scale insulin with NovoLog.  CBG well controlled.  6. Paroxysmal atrial fibrillation-heart rate is controlled,  continue amiodarone 200 mg p.o. daily.  Patient was taking Coumadin at home.  Continue Coumadin per pharmacy  7. Hypothyroidism-continue Synthroid  8. Dyslipidemia-continue atorvastatin.    CBG: Recent Labs  Lab 01/11/19 1622 01/11/19 2054 01/12/19 0621 01/12/19 0831 01/12/19 1144  GLUCAP 230* 122* 163* 203* 176*    CBC: Recent Labs  Lab 01/08/19 1220 01/09/19 0409 01/10/19 0341  WBC 24.5* 15.3* 11.3*  NEUTROABS 22.5*  --  9.2*  HGB 12.2* 10.8* 11.0*  HCT 39.3 32.9* 33.3*  MCV 94.2 94.5 92.2  PLT 222 202 218     Basic Metabolic Panel: Recent Labs  Lab 01/08/19 1220 01/09/19 0409 01/10/19 0341 01/11/19 0420 01/12/19 0507  NA 138 140 137 135 138  K 4.2 4.0 3.9 3.8 3.6  CL 101 107 102 96* 99  CO2 28 26 27 28 27   GLUCOSE 173* 99 133* 146* 130*  BUN 24* 23 21 19 22   CREATININE 1.44* 1.22 1.08 1.17 1.35*  CALCIUM 9.0 8.5* 8.6* 8.9 8.7*     DVT prophylaxis: Lovenox  Code Status: Full code  Family Communication: Discussed with wife at bedside  Disposition Plan: likely home when medically ready for discharge     Consultants:  None  Procedures:  None   Antibiotics:   Anti-infectives (From admission, onward)   Start     Dose/Rate Route Frequency Ordered Stop   01/08/19 1545  cefTRIAXone (ROCEPHIN) 1 g in sodium chloride 0.9 % 100 mL IVPB     1 g 200 mL/hr over 30 Minutes Intravenous  Once 01/08/19 1533 01/08/19 1635   01/08/19 1545  azithromycin (ZITHROMAX) 500 mg in sodium chloride 0.9 % 250 mL IVPB     500 mg 250 mL/hr over 60 Minutes Intravenous  Once 01/08/19 1533 01/08/19 1810       Objective   Vitals:   01/12/19 0749 01/12/19 0756 01/12/19 0812 01/12/19 0815  BP:   (!) 96/56 (!) 91/57  Pulse:   70 70  Resp:   (!) 24   Temp:   97.9 F (36.6 C)   TempSrc:   Oral   SpO2: 98% 98% 93% 94%  Weight:  Height:        Intake/Output Summary (Last 24 hours) at 01/12/2019 1504 Last data filed at 01/12/2019 1221 Gross per 24 hour  Intake 840 ml  Output 1600 ml  Net -760 ml   Filed Weights   01/10/19 0456 01/11/19 0413 01/12/19 0536  Weight: 88.9 kg 87.1 kg 85.7 kg     Physical Examination:    General: Appears in no acute distress  Cardiovascular: S1-S2, regular, no murmur auscultated  Respiratory: Creased breath sounds bilaterally.  Positive left chest wall tenderness.  Abdomen: Abdomen is soft, nontender, no organomegaly.  Extremities: No edema of the lower extremities  Neurologic: Alert, oriented x3, no focal deficit noted     Data  Reviewed: I have personally reviewed following labs and imaging studies   Recent Results (from the past 240 hour(s))  Culture, blood (routine x 2)     Status: None (Preliminary result)   Collection Time: 01/08/19  1:50 PM  Result Value Ref Range Status   Specimen Description BLOOD RIGHT ANTECUBITAL  Final   Special Requests   Final    BOTTLES DRAWN AEROBIC AND ANAEROBIC Blood Culture adequate volume   Culture   Final    NO GROWTH 4 DAYS Performed at [facility], [place] [address] [place], [place] [address]    Report Status PENDING  Incomplete  Culture, blood (routine x 2)     Status: None (Preliminary result)   Collection Time: 01/08/19  2:00 PM  Result Value Ref Range Status   Specimen Description BLOOD RIGHT ARM  Final   Special Requests   Final    BOTTLES DRAWN AEROBIC ONLY Blood Culture adequate volume   Culture   Final    NO GROWTH 4 DAYS Performed at [facility], [address], [place], [place] [address]    Report Status PENDING  Incomplete     Liver Function Tests: Recent Labs  Lab 01/08/19 1220  AST 32  ALT 16  ALKPHOS 77  BILITOT 0.6  PROT 6.2*  ALBUMIN 2.9*   No results for input(s): LIPASE, AMYLASE in the last 168 hours. No results for input(s): AMMONIA in the last 168 hours.  Cardiac Enzymes: No results for input(s): CKTOTAL, CKMB, CKMBINDEX, TROPONINI in the last 168 hours. BNP (last 3 results) Recent Labs    10/04/18 1003 01/08/19 1220 01/09/19 0409  BNP 485.2* 638.9* 569.8*    ProBNP (last 3 results) Recent Labs    02/27/18 1045 03/06/18 0752 03/13/18 0805  PROBNP 10,884* 54,492* 5,305*      Studies: No results found.  Scheduled Meds: . albuterol  2.5 mg Nebulization Daily  . amiodarone  200 mg Oral Daily  . atorvastatin  80 mg Oral q1800  . umeclidinium-vilanterol  1 puff Inhalation Daily   And  . budesonide (PULMICORT) nebulizer solution  0.25 mg Nebulization BID  . carvedilol  3.125 mg Oral BID WC  .  cyclobenzaprine  5 mg Oral TID  . ferrous sulfate  325 mg Oral QPM  . fluticasone  2 spray Each Nare Daily  . [START ON 01/13/2019] furosemide  40 mg Oral Daily  . insulin aspart  0-15 Units Subcutaneous TID WC  . levothyroxine  100 mcg Oral QAC breakfast  . potassium chloride  40 mEq Oral Once  . senna  1 tablet Oral BID  . sodium chloride flush  3 mL Intravenous Q12H  . warfarin  5 mg Oral q1800  . Warfarin - Pharmacist Dosing Inpatient  Does not apply q1800    Admission status: Inpatient: Based on patients clinical presentation and evaluation of above clinical data, I have made determination that patient meets Inpatient criteria at this time  Time spent: 30 min  [facility] Pager [phone]. If 7PM-7AM, please contact night-coverage at [email or web address], Office  [phone]  password [email or web address]  01/12/2019, 3:04 PM  LOS: 4 days

## 2019-01-12 NOTE — Progress Notes (Signed)
SATURATION QUALIFICATIONS: (This note is used to comply with regulatory documentation for home oxygen)  Patient Saturations on Room Air at Rest = 94%  Patient Saturations on Room Air while Ambulating = 88%  Patient Saturations on 3 Liters of oxygen while Ambulating = 96%

## 2019-01-13 ENCOUNTER — Inpatient Hospital Stay (HOSPITAL_COMMUNITY): Payer: HMO

## 2019-01-13 LAB — BASIC METABOLIC PANEL
Anion gap: 8 (ref 5–15)
BUN: 22 mg/dL (ref 8–23)
CO2: 28 mmol/L (ref 22–32)
Calcium: 8.8 mg/dL — ABNORMAL LOW (ref 8.9–10.3)
Chloride: 102 mmol/L (ref 98–111)
Creatinine, Ser: 1.11 mg/dL (ref 0.61–1.24)
GFR calc Af Amer: >60 mL/min (ref >60)
GFR calc non Af Amer: >60 mL/min (ref >60)
Glucose, Bld: 128 mg/dL — ABNORMAL HIGH (ref 70–99)
Potassium: 4.2 mmol/L (ref 3.5–5.1)
Sodium: 138 mmol/L (ref 135–145)

## 2019-01-13 LAB — CULTURE, BLOOD (ROUTINE X 2)
Culture: NO GROWTH
Culture: NO GROWTH
Special Requests: ADEQUATE
Special Requests: ADEQUATE

## 2019-01-13 LAB — GLUCOSE, CAPILLARY
Glucose-Capillary: 151 mg/dL — ABNORMAL HIGH (ref 70–99)
Glucose-Capillary: 162 mg/dL — ABNORMAL HIGH (ref 70–99)
Glucose-Capillary: 183 mg/dL — ABNORMAL HIGH (ref 70–99)
Glucose-Capillary: 149 mg/dL — ABNORMAL HIGH (ref 70–99)

## 2019-01-13 LAB — PROTIME-INR
INR: 2.2 — ABNORMAL HIGH (ref 0.8–1.2)
Prothrombin Time: 24.3 seconds — ABNORMAL HIGH (ref 11.4–15.2)

## 2019-01-13 MED ORDER — WARFARIN SODIUM 5 MG PO TABS
5.0000 mg | ORAL_TABLET | Freq: Once | ORAL | Status: AC
  Administered 2019-01-13: 5 mg via ORAL
  Filled 2019-01-13: qty 1

## 2019-01-13 MED ORDER — INSULIN ASPART 100 UNIT/ML ~~LOC~~ SOLN
0.0000 [IU] | Freq: Three times a day (TID) | SUBCUTANEOUS | Status: DC
  Administered 2019-01-13 (×3): 3 [IU] via SUBCUTANEOUS
  Administered 2019-01-14: 5 [IU] via SUBCUTANEOUS

## 2019-01-13 NOTE — Progress Notes (Signed)
Progress Note  Patient Name: [name] Date of Encounter: 01/13/2019  Primary Cardiologist: Larae Grooms, MD   Subjective   Currently with shortness of breath.  No chest pain.  On oxygen which she is not on at home.  Inpatient Medications    Scheduled Meds: . albuterol  2.5 mg Nebulization Daily  . amiodarone  200 mg Oral Daily  . atorvastatin  80 mg Oral q1800  . umeclidinium-vilanterol  1 puff Inhalation Daily   And  . budesonide (PULMICORT) nebulizer solution  0.25 mg Nebulization BID  . carvedilol  3.125 mg Oral BID WC  . cyclobenzaprine  5 mg Oral TID  . ferrous sulfate  325 mg Oral QPM  . fluticasone  2 spray Each Nare Daily  . furosemide  40 mg Oral Daily  . insulin aspart  0-15 Units Subcutaneous TID WC  . levothyroxine  100 mcg Oral QAC breakfast  . senna  1 tablet Oral BID  . sodium chloride flush  3 mL Intravenous Q12H  . warfarin  5 mg Oral ONCE-1800  . Warfarin - Pharmacist Dosing Inpatient   Does not apply q1800   Continuous Infusions:  PRN Meds: acetaminophen **OR** acetaminophen, ipratropium-albuterol, ondansetron **OR** ondansetron (ZOFRAN) IV, oxyCODONE, traMADol   Vital Signs    Vitals:   01/13/19 0523 01/13/19 0901 01/13/19 0927 01/13/19 1221  BP: 121/84  116/64 135/79  Pulse: 70 70 69 70  Resp: 20 16  19   Temp: 98.5 F (36.9 C)   (!) 97.3 F (36.3 C)  TempSrc: Oral   Oral  SpO2: 95% 96% 99% 100%  Weight: 83.6 kg     Height:        Intake/Output Summary (Last 24 hours) at 01/13/2019 1515 Last data filed at 01/13/2019 1222 Gross per 24 hour  Intake 850 ml  Output 1200 ml  Net -350 ml   Last 3 Weights 01/13/2019 01/12/2019 01/11/2019  Weight (lbs) 184 lb 4.9 oz 189 lb 192 lb  Weight (kg) 83.6 kg 85.73 kg 87.091 kg      Telemetry    Atrial ventricular paced.- Personally Reviewed  ECG    AV paced- Personally Reviewed  Physical Exam   GEN: No acute distress.   Neck: No JVD Cardiac: RRR, no murmurs, rubs, or  gallops.  Respiratory: Clear to auscultation bilaterally. GI: Soft, nontender, non-distended  MS: No edema; No deformity. Neuro:  Nonfocal  Psych: Normal affect   Labs    Chemistry Recent Labs  Lab 01/08/19 1220  01/11/19 0420 01/12/19 0507 01/13/19 0310  NA 138   < > 135 138 138  K 4.2   < > 3.8 3.6 4.2  CL 101   < > 96* 99 102  CO2 28   < > 28 27 28   GLUCOSE 173*   < > 146* 130* 128*  BUN 24*   < > 19 22 22   CREATININE 1.44*   < > 1.17 1.35* 1.11  CALCIUM 9.0   < > 8.9 8.7* 8.8*  PROT 6.2*  --   --   --   --   ALBUMIN 2.9*  --   --   --   --   AST 32  --   --   --   --   ALT 16  --   --   --   --   ALKPHOS 77  --   --   --   --   BILITOT 0.6  --   --   --   --  GFRNONAA 45*   < > 57* 48* >60  GFRAA 52*   < > >60 56* >60  ANIONGAP 9   < > 11 12 8    < > = values in this interval not displayed.     Hematology Recent Labs  Lab 01/08/19 1220 01/09/19 0409 01/10/19 0341  WBC 24.5* 15.3* 11.3*  RBC 4.17* 3.48* 3.61*  HGB 12.2* 10.8* 11.0*  HCT 39.3 32.9* 33.3*  MCV 94.2 94.5 92.2  MCH 29.3 31.0 30.5  MCHC 31.0 32.8 33.0  RDW 13.8 14.3 13.7  PLT 222 202 218    Cardiac EnzymesNo results for input(s): TROPONINI in the last 168 hours. No results for input(s): TROPIPOC in the last 168 hours.   BNP Recent Labs  Lab 01/08/19 1220 01/09/19 0409  BNP 638.9* 569.8*     DDimer No results for input(s): DDIMER in the last 168 hours.   Radiology    Dg Chest 2 View  Result Date: 01/13/2019 CLINICAL DATA:  CHF. EXAM: CHEST - 2 VIEW COMPARISON:  01/10/2019 FINDINGS: Left-sided pacemaker unchanged. Patient is slightly rotated to the right. Volume loss of the right lung with stable opacification over the right base likely effusion with atelectasis. Infection in the right base is possible. Subtle prominence of the central pulmonary vessels slightly improved. Stable cardiomegaly. Remainder of the exam is unchanged. IMPRESSION: Stable right base opacification likely  effusion with atelectasis. Infection in the right base is possible. Cardiomegaly with suggestion mild vascular congestion slightly improved. Electronically Signed   By: Marin Olp M.D.   On: 01/13/2019 09:03    Cardiac Studies   TTE 08/2018 - Left ventricle: The cavity size was severely dilated. Systolic   function was moderately to severely reduced. The estimated   ejection fraction was in the range of 30% to 35%. Severe diffuse   hypokinesis with distinct regional wall motion abnormalities.   There is severe hypokinesis to akinesis of the inferolateral and   inferior myocardium. There is hypokinesis of the lateral   myocardium. There is akinesis of the apical septal, apical   anterior and apical myocardium. - Aortic valve: Mildly to moderately calcified annulus. Trileaflet;   normal thickness, moderately calcified leaflets. Valve area   (VTI): 2.55 cm^2. Valve area (Vmax): 2.7 cm^2. Valve area   (Vmean): 2.13 cm^2. - Aorta: Aortic root dimension: 39 mm (ED). - Aortic root: The aortic root was mildly dilated. - Mitral valve: There was mild regurgitation. Valve area by   continuity equation (using LVOT flow): 2.29 cm^2. - Left atrium: The atrium was severely dilated. - Right atrium: The atrium was mildly dilated. - Pulmonic valve: There was trivial regurgitation. - Pulmonary arteries: PA peak pressure: 45 mm Hg (S).  Patient Profile     85 y.o. male with a history of COPD on CPAP, CHF, atrial fibrillation status post ablation, [employer] CRT D with defibrillator off, coronary disease status post CABG being evaluated for heart failure.  Assessment & Plan    1.  Acute on chronic systolic heart failure: Patient is at his dry weight but does not feel back to baseline.  Review of previous EKGs shows multiple QRS morphologies indicating possible pacemaker timing issues.  At this point, we  continue with diuresis on IV Lasix.  Chest x-ray does show continued pulmonary edema.    likely need pacemaker interrogation and adjustment as an outpatient.  2.  Paroxysmal atrial fibrillation: Is currently AV paced.  Continue anticoagulation.  3.  Near syncope: Felt due to  dehydration.     For questions or updates, please contact [place] Please consult [email or web address] for contact info under        Signed,  Meredith Leeds, MD  01/13/2019, 3:15 PM

## 2019-01-13 NOTE — Progress Notes (Signed)
[facility]  PROGRESS NOTE  [name] ELF:[id number] DOB: 1934-09-02 DOA: 01/08/2019 PCP: [name], MD   Brief HPI:   85 year old male with a history of atrial fibrillation, CAD, chronic systolic heart failure, COPD, sleep apnea, hypothyroidism presented to the hospital with near syncope.  Patient says he did not lose consciousness.    Subjective   Patient seen and examined, still complains of mild shortness of breath.  [place] cardiology consultation.   Assessment/Plan:     1. ?  Near syncope-patient denies losing consciousness.  He does have a history of ischemic cardiomyopathy status post CABG with LV function 30 to 35% with severe diffuse hypokinesis.  Continue telemetry monitoring.  2. Chest wall pain/muscular-tenderness to palpation on left lower rib cage, started Flexeril 5 mg p.o. 3 times daily.  3. Acute on chronic systolic CHF-chest x-ray shows bilateral pulmonary edema, no signs and symptoms of pneumonia.  Patient was started on IV Lasix and has diuresed, is net -4.5 L .IV Lasix was held due to hypotension.  Started on Lasix 40 mg p.o. daily.  Cardiology following  4. Hypertension-orthostatic vital signs were negative for postural hypotension.  Continue Coreg.  5. Diabetes mellitus type 2-continue sliding scale insulin with NovoLog.  CBG well controlled.  6. Paroxysmal atrial fibrillation-heart rate is controlled,  continue amiodarone 200 mg p.o. daily.  Patient was taking Coumadin at home.  Continue Coumadin per pharmacy  7. Hypothyroidism-continue Synthroid  8. Dyslipidemia-continue atorvastatin.    CBG: Recent Labs  Lab 01/12/19 1144 01/12/19 1700 01/12/19 2121 01/13/19 0549 01/13/19 1112  GLUCAP 176* 126* 148* 151* 162*    CBC: Recent Labs  Lab 01/08/19 1220 01/09/19 0409 01/10/19 0341  WBC 24.5* 15.3* 11.3*  NEUTROABS 22.5*  --  9.2*  HGB 12.2* 10.8* 11.0*  HCT 39.3 32.9* 33.3*  MCV 94.2 94.5 92.2  PLT 222 202 218     Basic Metabolic Panel: Recent Labs  Lab 01/09/19 0409 01/10/19 0341 01/11/19 0420 01/12/19 0507 01/13/19 0310  NA 140 137 135 138 138  K 4.0 3.9 3.8 3.6 4.2  CL 107 102 96* 99 102  CO2 26 27 28 27 28   GLUCOSE 99 133* 146* 130* 128*  BUN 23 21 19 22 22   CREATININE 1.22 1.08 1.17 1.35* 1.11  CALCIUM 8.5* 8.6* 8.9 8.7* 8.8*     DVT prophylaxis: Lovenox  Code Status: Full code  Family Communication: Discussed with wife at bedside  Disposition Plan: likely home when medically ready for discharge     Consultants:  None  Procedures:  None   Antibiotics:   Anti-infectives (From admission, onward)   Start     Dose/Rate Route Frequency Ordered Stop   01/08/19 1545  cefTRIAXone (ROCEPHIN) 1 g in sodium chloride 0.9 % 100 mL IVPB     1 g 200 mL/hr over 30 Minutes Intravenous  Once 01/08/19 1533 01/08/19 1635   01/08/19 1545  azithromycin (ZITHROMAX) 500 mg in sodium chloride 0.9 % 250 mL IVPB     500 mg 250 mL/hr over 60 Minutes Intravenous  Once 01/08/19 1533 01/08/19 1810       Objective   Vitals:   01/13/19 0523 01/13/19 0901 01/13/19 0927 01/13/19 1221  BP: 121/84  116/64 135/79  Pulse: 70 70 69 70  Resp: 20 16  19   Temp: 98.5 F (36.9 C)   (!) 97.3 F (36.3 C)  TempSrc: Oral   Oral  SpO2: 95% 96% 99% 100%  Weight: 83.6 kg  Height:        Intake/Output Summary (Last 24 hours) at 01/13/2019 1318 Last data filed at 01/13/2019 1222 Gross per 24 hour  Intake 850 ml  Output 1200 ml  Net -350 ml   Filed Weights   01/11/19 0413 01/12/19 0536 01/13/19 0523  Weight: 87.1 kg 85.7 kg 83.6 kg     Physical Examination:   General: Appears in no acute distress  Cardiovascular: S1-S2, regular  Respiratory: Decreased breath sounds bilaterally  Abdomen: Abdomen is soft, nontender, no organomegaly  Extremities: No edema of the lower extremities  Neurologic: Alert, oriented x3, no focal deficit noted.     Data Reviewed: I have personally  reviewed following labs and imaging studies   Recent Results (from the past 240 hour(s))  Culture, blood (routine x 2)     Status: None   Collection Time: 01/08/19  1:50 PM  Result Value Ref Range Status   Specimen Description BLOOD RIGHT ANTECUBITAL  Final   Special Requests   Final    BOTTLES DRAWN AEROBIC AND ANAEROBIC Blood Culture adequate volume   Culture   Final    NO GROWTH 5 DAYS Performed at [facility], [address] [place],  [address]    Report Status 01/13/2019 FINAL  Final  Culture, blood (routine x 2)     Status: None   Collection Time: 01/08/19  2:00 PM  Result Value Ref Range Status   Specimen Description BLOOD RIGHT ARM  Final   Special Requests   Final    BOTTLES DRAWN AEROBIC ONLY Blood Culture adequate volume   Culture   Final    NO GROWTH 5 DAYS Performed at [facility], [place] [address] [place],  [address]    Report Status 01/13/2019 FINAL  Final     Liver Function Tests: Recent Labs  Lab 01/08/19 1220  AST 32  ALT 16  ALKPHOS 77  BILITOT 0.6  PROT 6.2*  ALBUMIN 2.9*   No results for input(s): LIPASE, AMYLASE in the last 168 hours. No results for input(s): AMMONIA in the last 168 hours.  Cardiac Enzymes: No results for input(s): CKTOTAL, CKMB, CKMBINDEX, TROPONINI in the last 168 hours. BNP (last 3 results) Recent Labs    10/04/18 1003 01/08/19 1220 01/09/19 0409  BNP 485.2* 638.9* 569.8*    ProBNP (last 3 results) Recent Labs    02/27/18 1045 03/06/18 0752 03/13/18 0805  PROBNP 10,884* 12,873* 5,305*      Studies: Dg Chest 2 View  Result Date: 01/13/2019 CLINICAL DATA:  CHF. EXAM: CHEST - 2 VIEW COMPARISON:  01/10/2019 FINDINGS: Left-sided pacemaker unchanged. Patient is slightly rotated to the right. Volume loss of the right lung with stable opacification over the right base likely effusion with atelectasis. Infection in the right base is possible. Subtle prominence of the central pulmonary  vessels slightly improved. Stable cardiomegaly. Remainder of the exam is unchanged. IMPRESSION: Stable right base opacification likely effusion with atelectasis. Infection in the right base is possible. Cardiomegaly with suggestion mild vascular congestion slightly improved. Electronically Signed   By: Marin Olp M.D.   On: 01/13/2019 09:03    Scheduled Meds: . albuterol  2.5 mg Nebulization Daily  . amiodarone  200 mg Oral Daily  . atorvastatin  80 mg Oral q1800  . umeclidinium-vilanterol  1 puff Inhalation Daily   And  . budesonide (PULMICORT) nebulizer solution  0.25 mg Nebulization BID  . carvedilol  3.125 mg Oral BID WC  .  cyclobenzaprine  5 mg Oral TID  . ferrous sulfate  325 mg Oral QPM  . fluticasone  2 spray Each Nare Daily  . furosemide  40 mg Oral Daily  . insulin aspart  0-15 Units Subcutaneous TID WC  . levothyroxine  100 mcg Oral QAC breakfast  . senna  1 tablet Oral BID  . sodium chloride flush  3 mL Intravenous Q12H  . warfarin  5 mg Oral ONCE-1800  . Warfarin - Pharmacist Dosing Inpatient   Does not apply q1800    Admission status: Inpatient: Based on patients clinical presentation and evaluation of above clinical data, I have made determination that patient meets Inpatient criteria at this time  Time spent: 30 min  [facility] Pager [phone]. If 7PM-7AM, please contact night-coverage at [email or web address], Office  [phone]  password [email or web address]  01/13/2019, 1:18 PM  LOS: 5 days

## 2019-01-13 NOTE — Clinical Social Work Note (Signed)
Readmission risk screening completed. No addition resources needed at this time. Clinical Social Worker will sign off for now as social work intervention is no longer needed. Please consult [place] again if new need arises.   Shelton Silvas A Chason Mciver 01/13/2019

## 2019-01-13 NOTE — Progress Notes (Signed)
[place] for Coumadin Indication: atrial fibrillation  Allergies  Allergen Reactions  . Aldactone [Spironolactone] Other (See Comments)    Hyperkalemia  reported by Dr. [name] - pt is currently taking 12.5 mg daily -November 2017 per medication list by same MD    Patient Measurements: Height: 5\' 8"  (172.7 cm) Weight: 184 lb 4.9 oz (83.6 kg) IBW/kg (Calculated) : 68.4  Vital Signs: Temp: 98.5 F (36.9 C) (03/21 0523) Temp Source: Oral (03/21 0523) BP: 121/84 (03/21 0523) Pulse Rate: 70 (03/21 0523)  Labs: Recent Labs    01/11/19 0420 01/12/19 0507 01/13/19 0310  LABPROT 17.3* 21.1* 24.3*  INR 1.4* 1.9* 2.2*  CREATININE 1.17 1.35* 1.11    Estimated Creatinine Clearance: 53.1 mL/min (by C-G formula based on SCr of 1.11 mg/dL).   Medical History: Past Medical History:  Diagnosis Date  . Atrial fibrillation ([place])    persistent, previously seen at [facility] and placed on amiodarone  . Benign prostatic hypertrophy   . CAD (coronary artery disease)    multivessel s/p inferolateral wall MI with subsequent CABG 11/1998.  Cath 2009 with Patent grafts  . Cleft palate   . COPD with emphysema ([place]) 04/01/2010  . DM (diabetes mellitus), type 2 ([place])   . Dyspnea   . GERD (gastroesophageal reflux disease)   . HTN (hypertension)   . Hyperlipidemia   . Hypothyroidism   . Iron deficiency anemia   . Ischemic dilated cardiomyopathy ([place])    EF 35-40% by MUGA 6/11  . Myocardial infarction ([place])   . Nasal septal deviation   . Nephrolithiasis   . OSA (obstructive sleep apnea)   . PAF (paroxysmal atrial fibrillation) ([place])   . Peripheral arterial disease (HCC)    left subclavian artery stenosis  . PNA (pneumonia)   . Psoriasis   . Seborrheic keratosis   . Stroke ([place])   . Systolic congestive heart failure ([place]) 2009   s/p BiV ICD implantation by Dr Leonia Reeves (MDT)   Assessment: 85 yo M on warfarin PTA for afib - CHADS2VASC=8. PTA dose 5  mg daily, last taken 3/15.   INR therapeutic at 2.2. CBC stable  Goal of Therapy:  INR 2-3 Monitor platelets by anticoagulation protocol: Yes   Plan:  Warfarin 5 mg x 1 today Daily INR Monitor for signs/symptoms of bleeding  Vertis Kelch, PharmD PGY1 Pharmacy Resident Phone [phone] 01/13/2019       7:22 AM

## 2019-01-14 LAB — CUP PACEART REMOTE DEVICE CHECK
Battery Remaining Longevity: 12 mo
Battery Voltage: 2.87 V
Brady Statistic AP VP Percent: 73.28 %
Brady Statistic AP VS Percent: 2.1 %
Brady Statistic AS VP Percent: 18.15 %
Brady Statistic AS VS Percent: 6.47 %
Brady Statistic RA Percent Paced: 73.8 %
Brady Statistic RV Percent Paced: 86.15 %
Date Time Interrogation Session: 20200316174225
HighPow Impedance: 48 Ohm
HighPow Impedance: 59 Ohm
Implantable Lead Implant Date: 20091215
Implantable Lead Implant Date: 20091215
Implantable Lead Implant Date: 20091215
Implantable Lead Location: 753858
Implantable Lead Location: 753859
Implantable Lead Location: 753860
Implantable Lead Model: 4196
Implantable Lead Model: 5076
Implantable Lead Model: 6947
Implantable Pulse Generator Implant Date: 20150113
Lead Channel Impedance Value: 4047 Ohm
Lead Channel Impedance Value: 456 Ohm
Lead Channel Impedance Value: 513 Ohm
Lead Channel Impedance Value: 532 Ohm
Lead Channel Impedance Value: 665 Ohm
Lead Channel Impedance Value: 874 Ohm
Lead Channel Pacing Threshold Amplitude: 0.875 V
Lead Channel Pacing Threshold Amplitude: 1 V
Lead Channel Pacing Threshold Amplitude: 1.625 V
Lead Channel Pacing Threshold Pulse Width: 0.4 ms
Lead Channel Pacing Threshold Pulse Width: 0.4 ms
Lead Channel Pacing Threshold Pulse Width: 0.6 ms
Lead Channel Sensing Intrinsic Amplitude: 1.375 mV
Lead Channel Sensing Intrinsic Amplitude: 1.375 mV
Lead Channel Sensing Intrinsic Amplitude: 12.875 mV
Lead Channel Sensing Intrinsic Amplitude: 12.875 mV
Lead Channel Setting Pacing Amplitude: 1.5 V
Lead Channel Setting Pacing Amplitude: 2 V
Lead Channel Setting Pacing Amplitude: 2.5 V
Lead Channel Setting Pacing Pulse Width: 0.6 ms
Lead Channel Setting Pacing Pulse Width: 1 ms
Lead Channel Setting Sensing Sensitivity: 0.3 mV

## 2019-01-14 LAB — GLUCOSE, CAPILLARY
Glucose-Capillary: 112 mg/dL — ABNORMAL HIGH (ref 70–99)
Glucose-Capillary: 224 mg/dL — ABNORMAL HIGH (ref 70–99)

## 2019-01-14 LAB — PROTIME-INR
INR: 2.5 — ABNORMAL HIGH (ref 0.8–1.2)
Prothrombin Time: 26.9 seconds — ABNORMAL HIGH (ref 11.4–15.2)

## 2019-01-14 MED ORDER — WARFARIN SODIUM 5 MG PO TABS: 5.0000 mg | ORAL_TABLET | Freq: Once | ORAL | Status: DC

## 2019-01-14 MED ORDER — FUROSEMIDE 40 MG PO TABS: 40.0000 mg | ORAL_TABLET | Freq: Every day | ORAL | 1 refills | Status: DC

## 2019-01-14 NOTE — Progress Notes (Signed)
Patient ambulated in the hallway with nurse tech using walker. Pt denied shortness of breath, weakness or chest pain per nurse tech. Patient is currently on 2L at 99% oxygen saturation. Wife at bedside.

## 2019-01-14 NOTE — Progress Notes (Signed)
[name] for Coumadin Indication: atrial fibrillation  Allergies  Allergen Reactions  . Aldactone [Spironolactone] Other (See Comments)    Hyperkalemia  reported by Dr. [name] - pt is currently taking 12.5 mg daily -November 2017 per medication list by same MD    Patient Measurements: Height: 5\' 8"  (172.7 cm) Weight: 190 lb 4.8 oz (86.3 kg)(scale b) IBW/kg (Calculated) : 68.4  Vital Signs: Temp: 97.9 F (36.6 C) (03/22 0511) Temp Source: Oral (03/22 0511) BP: 132/73 (03/22 0511) Pulse Rate: 70 (03/22 0511)  Labs: Recent Labs    01/12/19 0507 01/13/19 0310 01/14/19 0430  LABPROT 21.1* 24.3* 26.9*  INR 1.9* 2.2* 2.5*  CREATININE 1.35* 1.11  --     Estimated Creatinine Clearance: 53.9 mL/min (by C-G formula based on SCr of 1.11 mg/dL).   Medical History: Past Medical History:  Diagnosis Date  . Atrial fibrillation ([place])    persistent, previously seen at [facility] and placed on amiodarone  . Benign prostatic hypertrophy   . CAD (coronary artery disease)    multivessel s/p inferolateral wall MI with subsequent CABG 11/1998.  Cath 2009 with Patent grafts  . Cleft palate   . COPD with emphysema ([place]) 04/01/2010  . DM (diabetes mellitus), type 2 ([place])   . Dyspnea   . GERD (gastroesophageal reflux disease)   . HTN (hypertension)   . Hyperlipidemia   . Hypothyroidism   . Iron deficiency anemia   . Ischemic dilated cardiomyopathy ([place])    EF 35-40% by MUGA 6/11  . Myocardial infarction ([place])   . Nasal septal deviation   . Nephrolithiasis   . OSA (obstructive sleep apnea)   . PAF (paroxysmal atrial fibrillation) ([place])   . Peripheral arterial disease (HCC)    left subclavian artery stenosis  . PNA (pneumonia)   . Psoriasis   . Seborrheic keratosis   . Stroke ([place])   . Systolic congestive heart failure ([place]) 2009   s/p BiV ICD implantation by Dr Leonia Reeves (MDT)   Assessment: 85 yo M on warfarin PTA for afib - CHADS2VASC=8.  PTA dose 5 mg daily, last taken 3/15.   INR therapeutic at 2.5  Goal of Therapy:  INR 2-3 Monitor platelets by anticoagulation protocol: Yes   Plan:  Warfarin 5 mg x 1 today Daily INR Monitor for signs/symptoms of bleeding  Vertis Kelch, PharmD PGY1 Pharmacy Resident Phone [phone] 01/14/2019       7:24 AM

## 2019-01-14 NOTE — Progress Notes (Signed)
SATURATION QUALIFICATIONS: (This note is used to comply with regulatory documentation for home oxygen)  Patient Saturations on Room Air at Rest = 92%  Patient Saturations on Room Air while Ambulating = 93%

## 2019-01-14 NOTE — Progress Notes (Signed)
Patient was discharge home with wife. Peripheral iv removed,clean dry and intact,dressing and pressure applied. Patients belongings with wife: glasses,purse/wallet,cell phone,dentures and clothing.  Discharge and medication instructions and education given with teach back. Wife and patient's questions were answered, no further concerns. Patient on room air, denies shortness of breath, or chest pain. CCMD called. Discharge packet given.  Took patient in wheelchair to [employer] where wife was waiting for him.

## 2019-01-14 NOTE — Discharge Summary (Addendum)
Physician Discharge Summary  SEDERICK JACOBSEN IOE:[id number] DOB: 1934-05-14 DOA: 01/08/2019  PCP: [name], MD  Admit date: 01/08/2019 Discharge date: 01/14/2019  Time spent:  minutes  Recommendations for Outpatient Follow-up:  1. Follow up PCP in 2 weeks 2. Follow up cardiology as outpatient   Discharge Diagnoses:  Principal Problem:   Dehydration Active Problems:   Essential hypertension   Automatic implantable cardioverter-defibrillator in situ   Long term current use of anticoagulant therapy   Coronary artery disease   PAF (paroxysmal atrial fibrillation) (HCC)   OSA (obstructive sleep apnea)   Diabetes mellitus with diabetic neuropathy, with long-term current use of insulin (HCC)   Morbid obesity due to excess calories (HCC)   Acute on chronic systolic CHF (congestive heart failure), NYHA class 4 (HCC)   CKD stage 3 due to type 2 diabetes mellitus (HCC)   Leukocytosis   ICD (implantable cardioverter-defibrillator) lead failure, sequela   Discharge Condition: Stable  Diet recommendation: Heart healthy diet  Filed Weights   01/12/19 0536 01/13/19 0523 01/14/19 0511  Weight: 85.7 kg 83.6 kg 86.3 kg    History of present illness:  85 year old male with a history of atrial fibrillation, CAD, chronic systolic heart failure, COPD, sleep apnea, hypothyroidism presented to the hospital with near syncope.  Patient says he did not lose consciousness.   Hospital Course:   1. ?  Near syncope-patient denies losing consciousness.  He does have a history of ischemic cardiomyopathy status post CABG with LV function 30 to 35% with severe diffuse hypokinesis.  Lasix dose has been adjusted to once daily.  2. Chest wall pain/muscular- improved, tenderness to palpation on left lower rib cage,  Was started Flexeril 5 mg p.o. 3 times daily.   3. Acute on chronic systolic CHF- resolved with IV Lasix, O2 sats are 92% on ambulation on RA,chest x-ray shows bilateral pulmonary edema,  no signs and symptoms of pneumonia.  Patient was started on IV Lasix and has diuresed,  net -4.3 L .IV Lasix was held due to hypotension.  Started on Lasix 40 mg p.o. daily.  Cardiology recommends to discharge home on Lasix 40 mg daily.  4. Hypertension-orthostatic vital signs were negative for postural hypotension. Continue Coreg.  5. Diabetes mellitus type 2-continue sliding scale insulin with NovoLog. CBG well controlled.  6. Paroxysmal atrial fibrillation-heart rate is controlled,  continue amiodarone 200 mg p.o. daily.  Patient was taking Coumadin at home.  Continue coumadin at home.  7. Hypothyroidism-continue Synthroid  8. Dyslipidemia-continue atorvastatin.   Procedures:    Consultations:  Cardiology   Discharge Exam: Vitals:   01/14/19 0900 01/14/19 0912  BP:  (!) 124/42  Pulse:  64  Resp:    Temp:    SpO2: 98% 99%    General: Appears in no acute distress Cardiovascular: S1S2 RRR Respiratory: Clear bilaterally  Discharge Instructions   Discharge Instructions    Diet - low sodium heart healthy   Complete by:  As directed    Increase activity slowly   Complete by:  As directed      Allergies as of 01/14/2019      Reactions   Aldactone [spironolactone] Other (See Comments)   Hyperkalemia  reported by Dr. [name] - pt is currently taking 12.5 mg daily -November 2017 per medication list by same MD      Medication List    STOP taking these medications   bisacodyl 5 MG EC tablet Commonly known as:  DULCOLAX     TAKE  these medications   acetaminophen 500 MG tablet Commonly known as:  TYLENOL Take 1,000 mg by mouth at bedtime as needed for mild pain or moderate pain.   albuterol (2.5 MG/3ML) 0.083% nebulizer solution Commonly known as:  PROVENTIL USE 1 VIAL VIA NEBULIZER 3 TIMES A DAY AS DIRECTED. DX:J44.9 What changed:    See the new instructions.  Another medication with the same name was removed. Continue taking this medication, and  follow the directions you see here.   amiodarone 400 MG tablet Commonly known as:  PACERONE TAKE HALF TABLET (200 MG TOTAL) BY MOUTH DAILY. What changed:  See the new instructions.   atorvastatin 80 MG tablet Commonly known as:  LIPITOR TAKE 1 TABLET (80 MG TOTAL) BY MOUTH DAILY. What changed:  See the new instructions.   carvedilol 6.25 MG tablet Commonly known as:  COREG Take 0.5 tablets (3.125 mg total) by mouth 2 (two) times daily. Take if systolic blood pressure >161 What changed:    when to take this  additional instructions   cholecalciferol 1000 units tablet Commonly known as:  VITAMIN D Take 2,000 Units by mouth every evening.   dextromethorphan-guaiFENesin 30-600 MG 12hr tablet Commonly known as:  MUCINEX DM Take 1 tablet by mouth 2 (two) times daily as needed for cough.   feeding supplement (ENSURE ENLIVE) Liqd Take 237 mLs by mouth 2 (two) times daily between meals.   ferrous sulfate 325 (65 FE) MG tablet Take 325 mg by mouth every evening.   fluticasone 50 MCG/ACT nasal spray Commonly known as:  FLONASE SPRAY 2 SPRAYS INTO EACH NOSTRIL EVERY DAY What changed:  See the new instructions.   furosemide 40 MG tablet Commonly known as:  LASIX Take 1 tablet (40 mg total) by mouth daily. What changed:    when to take this  additional instructions   gabapentin 100 MG capsule Commonly known as:  NEURONTIN Take 100 mg by mouth 3 (three) times daily.   levothyroxine 100 MCG tablet Commonly known as:  SYNTHROID, LEVOTHROID Take 100 mcg by mouth daily before breakfast.   Potassium Chloride ER 20 MEQ Tbcr Take 20 mEq by mouth daily.   PRESCRIPTION MEDICATION Inhale into the lungs at bedtime. CPAP   PRESERVISION AREDS PO Take 1 tablet by mouth 2 (two) times daily.   Senokot 8.6 MG tablet Generic drug:  senna Take 1 tablet by mouth 2 (two) times daily.   traMADol 50 MG tablet Commonly known as:  ULTRAM Take 50 mg by mouth at bedtime as needed for  moderate pain or severe pain.   Trelegy Ellipta 100-62.5-25 MCG/INH Aepb Generic drug:  Fluticasone-Umeclidin-Vilant INHALE 1 PUFF INTO LUNGS ONCE A DAY What changed:  See the new instructions.   vitamin B-12 1000 MCG tablet Commonly known as:  CYANOCOBALAMIN Take 1,000 mcg by mouth daily.   warfarin 5 MG tablet Commonly known as:  COUMADIN Take as directed. If you are unsure how to take this medication, talk to your nurse or doctor. Original instructions:  TAKE AS DIRECTED BY COUMADIN CLINIC What changed:  See the new instructions.   Xultophy 100-3.6 UNIT-MG/ML Sopn Generic drug:  Insulin Degludec-Liraglutide Inject 16 Units into the skin every morning.      Allergies  Allergen Reactions  . Aldactone [Spironolactone] Other (See Comments)    Hyperkalemia  reported by Dr. [name] - pt is currently taking 12.5 mg daily -November 2017 per medication list by same MD      The results of significant  diagnostics from this hospitalization (including imaging, microbiology, ancillary and laboratory) are listed below for reference.    Significant Diagnostic Studies: Dg Chest 2 View  Result Date: 01/13/2019 CLINICAL DATA:  CHF. EXAM: CHEST - 2 VIEW COMPARISON:  01/10/2019 FINDINGS: Left-sided pacemaker unchanged. Patient is slightly rotated to the right. Volume loss of the right lung with stable opacification over the right base likely effusion with atelectasis. Infection in the right base is possible. Subtle prominence of the central pulmonary vessels slightly improved. Stable cardiomegaly. Remainder of the exam is unchanged. IMPRESSION: Stable right base opacification likely effusion with atelectasis. Infection in the right base is possible. Cardiomegaly with suggestion mild vascular congestion slightly improved. Electronically Signed   By: Marin Olp M.D.   On: 01/13/2019 09:03   Dg Chest 2 View  Result Date: 01/10/2019 CLINICAL DATA:  Shortness of breath EXAM: CHEST - 2 VIEW  COMPARISON:  January 08, 2019 FINDINGS: There is a persistent right pleural effusion. There is underlying interstitial edema. There is airspace consolidation in both lower lobes. There is cardiomegaly with mild pulmonary venous hypertension. Pacemaker leads are attached to the right atrium and right ventricle, stable. Patient is status post coronary artery bypass grafting. Bones appear osteoporotic. IMPRESSION: Pulmonary vascular congestion. Right pleural effusion. Consolidation in both lower lobes, a finding that may be due to either pneumonia or alveolar edema. Both entities may well be present concurrently. The overall appearance is felt to be indicative of a degree of congestive heart failure. Appearance is essentially stable compared to 2 days prior. Electronically Signed   By: Lowella Grip III M.D.   On: 01/10/2019 07:43   Dg Chest Port 1 View  Result Date: 01/08/2019 CLINICAL DATA:  Syncope EXAM: PORTABLE CHEST 1 VIEW COMPARISON:  10/04/2018, 09/20/2018 FINDINGS: Cardiac enlargement with changes of CABG and AICD.  Underlying COPD Progression of right pleural effusion and right lower lobe airspace disease. Improvement in right upper lobe infiltrate since the prior study. Pulmonary vascular congestion and probable edema have progressed in the interval. IMPRESSION: Progression of vascular congestion and bilateral airspace disease likely due to pulmonary edema. Progression of right pleural effusion and right lower lobe atelectasis. Improvement in right upper lobe infiltrate which may have been pneumonia on 10/04/2018. Electronically Signed   By: Franchot Gallo M.D.   On: 01/08/2019 12:17    Microbiology: Recent Results (from the past 240 hour(s))  Culture, blood (routine x 2)     Status: None   Collection Time: 01/08/19  1:50 PM  Result Value Ref Range Status   Specimen Description BLOOD RIGHT ANTECUBITAL  Final   Special Requests   Final    BOTTLES DRAWN AEROBIC AND ANAEROBIC Blood Culture  adequate volume   Culture   Final    NO GROWTH 5 DAYS Performed at [facility], [address] [place], [place] [address]    Report Status 01/13/2019 FINAL  Final  Culture, blood (routine x 2)     Status: None   Collection Time: 01/08/19  2:00 PM  Result Value Ref Range Status   Specimen Description BLOOD RIGHT ARM  Final   Special Requests   Final    BOTTLES DRAWN AEROBIC ONLY Blood Culture adequate volume   Culture   Final    NO GROWTH 5 DAYS Performed at [facility], [place] [address], [place], [place] [address]    Report Status 01/13/2019 FINAL  Final     Labs: Basic Metabolic Panel: Recent Labs  Lab  01/09/19 0409 01/10/19 0341 01/11/19 0420 01/12/19 0507 01/13/19 0310  NA 140 137 135 138 138  K 4.0 3.9 3.8 3.6 4.2  CL 107 102 96* 99 102  CO2 26 27 28 27 28   GLUCOSE 99 133* 146* 130* 128*  BUN 23 21 19 22 22   CREATININE 1.22 1.08 1.17 1.35* 1.11  CALCIUM 8.5* 8.6* 8.9 8.7* 8.8*   Liver Function Tests: Recent Labs  Lab 01/08/19 1220  AST 32  ALT 16  ALKPHOS 77  BILITOT 0.6  PROT 6.2*  ALBUMIN 2.9*   No results for input(s): LIPASE, AMYLASE in the last 168 hours. No results for input(s): AMMONIA in the last 168 hours. CBC: Recent Labs  Lab 01/08/19 1220 01/09/19 0409 01/10/19 0341  WBC 24.5* 15.3* 11.3*  NEUTROABS 22.5*  --  9.2*  HGB 12.2* 10.8* 11.0*  HCT 39.3 32.9* 33.3*  MCV 94.2 94.5 92.2  PLT 222 202 218   Cardiac Enzymes: No results for input(s): CKTOTAL, CKMB, CKMBINDEX, TROPONINI in the last 168 hours. BNP: BNP (last 3 results) Recent Labs    10/04/18 1003 01/08/19 1220 01/09/19 0409  BNP 485.2* 638.9* 569.8*    ProBNP (last 3 results) Recent Labs    02/27/18 1045 03/06/18 0752 03/13/18 0805  PROBNP 10,884* 12,873* 5,305*    CBG: Recent Labs  Lab 01/13/19 0549 01/13/19 1112 01/13/19 1622 01/13/19 2100 01/14/19 0642  GLUCAP 151* 162* 183* 149* 112*       Signed:  Oswald Hillock MD.  [facility] 01/14/2019, 11:06 AM

## 2019-01-14 NOTE — Progress Notes (Signed)
Progress Note  Patient Name: [name] Date of Encounter: 01/14/2019  Primary Cardiologist: Larae Grooms, MD   Subjective   Feeling well.  No chest pain.  Shortness of breath is greatly improved.  Inpatient Medications    Scheduled Meds: . albuterol  2.5 mg Nebulization Daily  . amiodarone  200 mg Oral Daily  . atorvastatin  80 mg Oral q1800  . umeclidinium-vilanterol  1 puff Inhalation Daily   And  . budesonide (PULMICORT) nebulizer solution  0.25 mg Nebulization BID  . carvedilol  3.125 mg Oral BID WC  . cyclobenzaprine  5 mg Oral TID  . ferrous sulfate  325 mg Oral QPM  . fluticasone  2 spray Each Nare Daily  . furosemide  40 mg Oral Daily  . insulin aspart  0-15 Units Subcutaneous TID WC  . levothyroxine  100 mcg Oral QAC breakfast  . senna  1 tablet Oral BID  . sodium chloride flush  3 mL Intravenous Q12H  . warfarin  5 mg Oral ONCE-1800  . Warfarin - Pharmacist Dosing Inpatient   Does not apply q1800   Continuous Infusions:  PRN Meds: acetaminophen **OR** acetaminophen, ipratropium-albuterol, ondansetron **OR** ondansetron (ZOFRAN) IV, oxyCODONE, traMADol   Vital Signs    Vitals:   01/14/19 0855 01/14/19 0858 01/14/19 0900 01/14/19 0912  BP: (!) 116/55   (!) 124/42  Pulse: 70   64  Resp: 18     Temp:      TempSrc:      SpO2: 99% 98% 98% 99%  Weight:      Height:        Intake/Output Summary (Last 24 hours) at 01/14/2019 1009 Last data filed at 01/14/2019 0855 Gross per 24 hour  Intake 960 ml  Output 1225 ml  Net -265 ml   Last 3 Weights 01/14/2019 01/13/2019 01/12/2019  Weight (lbs) 190 lb 4.8 oz 184 lb 4.9 oz 189 lb  Weight (kg) 86.32 kg 83.6 kg 85.73 kg      Telemetry    AV paced.- Personally Reviewed  ECG    None new- Personally Reviewed  Physical Exam   GEN: Well nourished, well developed, in no acute distress  HEENT: normal  Neck: no JVD, carotid bruits, or masses Cardiac: RRR; no murmurs, rubs, or gallops,no edema   Respiratory:  clear to auscultation bilaterally, normal work of breathing GI: soft, nontender, nondistended, + BS MS: no deformity or atrophy  Skin: warm and dry Neuro:  Strength and sensation are intact Psych: euthymic mood, full affect   Labs    Chemistry Recent Labs  Lab 01/08/19 1220  01/11/19 0420 01/12/19 0507 01/13/19 0310  NA 138   < > 135 138 138  K 4.2   < > 3.8 3.6 4.2  CL 101   < > 96* 99 102  CO2 28   < > 28 27 28   GLUCOSE 173*   < > 146* 130* 128*  BUN 24*   < > 19 22 22   CREATININE 1.44*   < > 1.17 1.35* 1.11  CALCIUM 9.0   < > 8.9 8.7* 8.8*  PROT 6.2*  --   --   --   --   ALBUMIN 2.9*  --   --   --   --   AST 32  --   --   --   --   ALT 16  --   --   --   --   ALKPHOS 77  --   --   --   --  BILITOT 0.6  --   --   --   --   GFRNONAA 45*   < > 57* 48* >60  GFRAA 52*   < > >60 56* >60  ANIONGAP 9   < > 11 12 8    < > = values in this interval not displayed.     Hematology Recent Labs  Lab 01/08/19 1220 01/09/19 0409 01/10/19 0341  WBC 24.5* 15.3* 11.3*  RBC 4.17* 3.48* 3.61*  HGB 12.2* 10.8* 11.0*  HCT 39.3 32.9* 33.3*  MCV 94.2 94.5 92.2  MCH 29.3 31.0 30.5  MCHC 31.0 32.8 33.0  RDW 13.8 14.3 13.7  PLT 222 202 218    Cardiac EnzymesNo results for input(s): TROPONINI in the last 168 hours. No results for input(s): TROPIPOC in the last 168 hours.   BNP Recent Labs  Lab 01/08/19 1220 01/09/19 0409  BNP 638.9* 569.8*     DDimer No results for input(s): DDIMER in the last 168 hours.   Radiology    Dg Chest 2 View  Result Date: 01/13/2019 CLINICAL DATA:  CHF. EXAM: CHEST - 2 VIEW COMPARISON:  01/10/2019 FINDINGS: Left-sided pacemaker unchanged. Patient is slightly rotated to the right. Volume loss of the right lung with stable opacification over the right base likely effusion with atelectasis. Infection in the right base is possible. Subtle prominence of the central pulmonary vessels slightly improved. Stable cardiomegaly. Remainder of  the exam is unchanged. IMPRESSION: Stable right base opacification likely effusion with atelectasis. Infection in the right base is possible. Cardiomegaly with suggestion mild vascular congestion slightly improved. Electronically Signed   By: Marin Olp M.D.   On: 01/13/2019 09:03    Cardiac Studies   TTE 08/2018 - Left ventricle: The cavity size was severely dilated. Systolic   function was moderately to severely reduced. The estimated   ejection fraction was in the range of 30% to 35%. Severe diffuse   hypokinesis with distinct regional wall motion abnormalities.   There is severe hypokinesis to akinesis of the inferolateral and   inferior myocardium. There is hypokinesis of the lateral   myocardium. There is akinesis of the apical septal, apical   anterior and apical myocardium. - Aortic valve: Mildly to moderately calcified annulus. Trileaflet;   normal thickness, moderately calcified leaflets. Valve area   (VTI): 2.55 cm^2. Valve area (Vmax): 2.7 cm^2. Valve area   (Vmean): 2.13 cm^2. - Aorta: Aortic root dimension: 39 mm (ED). - Aortic root: The aortic root was mildly dilated. - Mitral valve: There was mild regurgitation. Valve area by   continuity equation (using LVOT flow): 2.29 cm^2. - Left atrium: The atrium was severely dilated. - Right atrium: The atrium was mildly dilated. - Pulmonic valve: There was trivial regurgitation. - Pulmonary arteries: PA peak pressure: 45 mm Hg (S).  Patient Profile     85 y.o. male with a history of COPD on CPAP, CHF, atrial fibrillation status post ablation, Medtronic CRT D with defibrillator off, coronary disease status post CABG being evaluated for heart failure.  Assessment & Plan    1.  Acute on chronic systolic heart failure: Currently at his dry weight.  His respiratory status is apparently back to its baseline.  His ECG does show multiple QRS morphologies, though device interrogation shows the leads are stable.  He Johnaton Sonneborn potentially  require AV optimization as an outpatient.  His respiratory status is stable.  Should he be able to ambulate without issue, would be okay for discharge today on current  doses of heart failure medications.  See him back tomorrow should he continue to desaturate.   2.  Paroxysmal atrial fibrillation: A V paced.  Continue amiodarone and warfarin  3.  Near syncope: Due to dehydration.  No further episodes.     For questions or updates, please contact [place] Please consult [email or web address] for contact info under        Signed, Cypress Hinkson Meredith Leeds, MD  01/14/2019, 10:09 AM

## 2019-01-15 ENCOUNTER — Other Ambulatory Visit: Payer: Self-pay

## 2019-01-15 NOTE — Patient Outreach (Signed)
  [facility]) Care Management Chronic Special Needs Program    01/15/2019  Name: [name], DOB: [birth year]  MRN: [id number]   Mr. Atul Delucia is enrolled in a chronic special needs plan for Heart Failure.[place] hospitalized 3/16-3/23 with near syncope, dehydration, acute on chronic systolic heart failure. Discharged on 01/14/2019. Care Plan updated.  Plan: continue to follow.  Thea Silversmith, RN, MSN, [place] [phone]

## 2019-01-18 ENCOUNTER — Other Ambulatory Visit: Payer: Self-pay

## 2019-01-18 NOTE — Patient Outreach (Signed)
  [facility]) Care Management Chronic Special Needs Program    01/18/2019  Name: [name], DOB: 29-Dec-1933  MRN: [id number]   Mr. Derreck Wiltsey is enrolled in a chronic special needs plan for Heart Failure. RNCM received notification regarding EMMI red: client has questions about discharge papers and has not scheduled follow up. RNCM called to follow up. Spoke with client's wife who reports she just finished speaking with transition of care nurse, Suanne Marker and has no questions. Mrs. Broad also states that client has his scheduled follow up appointments made.   Plan: continue to follow.   Thea Silversmith, RN, MSN, [place] [phone]

## 2019-01-18 NOTE — Progress Notes (Signed)
Remote ICD transmission.   

## 2019-01-21 ENCOUNTER — Telehealth: Payer: Self-pay

## 2019-01-21 NOTE — Telephone Encounter (Signed)
Lovette Cliche I, RN        Hello,   I contacted the patient he is unable to set up my chart to do a tele health visit and the patient is hard of hearing also. The patient would like to know if [name] can schedule an office visit. Can you please contact the patient to discuss.   Thanks,  Maryjane Hurter   Previous Messages    ----- Message -----  From: Debbora Dus  Sent: 01/18/2019 11:46 AM EDT  To: Maryjane Hurter   GOOD MORNING!!!   I hope you are doing well!! Above pt needs an appt for heart failure follow up with Irish Lack or his APP within the next 1-2 weeks, per Dr.Camnitz, ok to due Tele health visit.      Thanks,  [employer]

## 2019-01-21 NOTE — Telephone Encounter (Signed)
Virtual Visit Pre-Appointment Phone Call    TELEPHONE CALL NOTE  [name] has been deemed a candidate for a follow-up tele-health visit to limit community exposure during the Covid-19 pandemic. I spoke with the patient via phone to ensure availability of phone/video source, confirm preferred email & phone number, and discuss instructions and expectations.  I reminded [name] to be prepared with any vital sign and/or heart rhythm information that could potentially be obtained via home monitoring, at the time of his visit. I reminded [name] to expect a phone call at the time of his visit if his visit.  Patient did not want to proceed with MyChart, but wanted to proceed with Video Visit with [name] tomorrow. Reviewed Consent below with patient/wife.   Did the patient verbally acknowledge consent to treatment? [place], RN 01/21/2019 5:00 PM   DOWNLOADING [place], go to [employer] and type in WebEx in the search bar. [place] [employer], the blue/green circle. The app is free but as with any other app downloads, their phone may require them to verify saved payment information or Apple password. The patient does NOT have to create an account.  - If Android, ask patient to go to [employer] and type in WebEx in the search bar. [place] [employer], the blue/green circle. The app is free but as with any other app downloads, their phone may require them to verify saved payment information or Android password. The patient does NOT have to create an account.   CONSENT FOR TELE-HEALTH VISIT - PLEASE REVIEW  I hereby voluntarily request, consent and authorize [facility] and its employed or contracted physicians, physician assistants, nurse practitioners or other licensed health care professionals (the Practitioner), to provide me with telemedicine health care services (the  "Services") as deemed necessary by the treating Practitioner. I acknowledge and consent to receive the Services by the Practitioner via telemedicine. I understand that the telemedicine visit will involve communicating with the Practitioner through live audiovisual communication technology and the disclosure of certain medical information by electronic transmission. I acknowledge that I have been given the opportunity to request an in-person assessment or other available alternative prior to the telemedicine visit and am voluntarily participating in the telemedicine visit.  I understand that I have the right to withhold or withdraw my consent to the use of telemedicine in the course of my care at any time, without affecting my right to future care or treatment, and that the Practitioner or I may terminate the telemedicine visit at any time. I understand that I have the right to inspect all information obtained and/or recorded in the course of the telemedicine visit and may receive copies of available information for a reasonable fee.  I understand that some of the potential risks of receiving the Services via telemedicine include:  [name] Delay or interruption in medical evaluation due to technological equipment failure or disruption; . Information transmitted may not be sufficient (e.g. poor resolution of images) to allow for appropriate medical decision making by the Practitioner; and/or  . In rare instances, security protocols could fail, causing a breach of personal health information.  Furthermore, I acknowledge that it is my responsibility to provide information about my medical history, conditions and care that is complete and accurate to the best of my ability. I acknowledge that Practitioner's advice, recommendations, and/or decision may be based on factors not  within their control, such as incomplete or inaccurate data provided by me or distortions of diagnostic images or specimens that may result from  electronic transmissions. I understand that the practice of medicine is not an exact science and that Practitioner makes no warranties or guarantees regarding treatment outcomes. I acknowledge that I will receive a copy of this consent concurrently upon execution via email to the email address I last provided but may also request a printed copy by calling the office of [place].    I understand that my insurance will be billed for this visit.   I have read or had this consent read to me. . I understand the contents of this consent, which adequately explains the benefits and risks of the Services being provided via telemedicine.  . I have been provided ample opportunity to ask questions regarding this consent and the Services and have had my questions answered to my satisfaction. . I give my informed consent for the services to be provided through the use of telemedicine in my medical care  By participating in this telemedicine visit I agree to the above.

## 2019-01-22 ENCOUNTER — Other Ambulatory Visit: Payer: Self-pay

## 2019-01-22 ENCOUNTER — Encounter: Payer: Self-pay | Admitting: Interventional Cardiology

## 2019-01-22 ENCOUNTER — Ambulatory Visit (INDEPENDENT_AMBULATORY_CARE_PROVIDER_SITE_OTHER): Payer: HMO

## 2019-01-22 ENCOUNTER — Telehealth (INDEPENDENT_AMBULATORY_CARE_PROVIDER_SITE_OTHER): Payer: HMO | Admitting: Interventional Cardiology

## 2019-01-22 VITALS — BP 104/56 | HR 92 | Ht 66.0 in | Wt 187.0 lb

## 2019-01-22 DIAGNOSIS — R0782 Intercostal pain: Secondary | ICD-10-CM

## 2019-01-22 DIAGNOSIS — E1151 Type 2 diabetes mellitus with diabetic peripheral angiopathy without gangrene: Secondary | ICD-10-CM | POA: Diagnosis not present

## 2019-01-22 DIAGNOSIS — I5022 Chronic systolic (congestive) heart failure: Secondary | ICD-10-CM

## 2019-01-22 DIAGNOSIS — I480 Paroxysmal atrial fibrillation: Secondary | ICD-10-CM | POA: Diagnosis not present

## 2019-01-22 DIAGNOSIS — E1122 Type 2 diabetes mellitus with diabetic chronic kidney disease: Secondary | ICD-10-CM | POA: Diagnosis not present

## 2019-01-22 DIAGNOSIS — N183 Chronic kidney disease, stage 3 (moderate): Secondary | ICD-10-CM | POA: Diagnosis not present

## 2019-01-22 DIAGNOSIS — I5023 Acute on chronic systolic (congestive) heart failure: Secondary | ICD-10-CM | POA: Diagnosis not present

## 2019-01-22 DIAGNOSIS — I2510 Atherosclerotic heart disease of native coronary artery without angina pectoris: Secondary | ICD-10-CM

## 2019-01-22 DIAGNOSIS — J449 Chronic obstructive pulmonary disease, unspecified: Secondary | ICD-10-CM | POA: Diagnosis not present

## 2019-01-22 DIAGNOSIS — E1142 Type 2 diabetes mellitus with diabetic polyneuropathy: Secondary | ICD-10-CM | POA: Diagnosis not present

## 2019-01-22 DIAGNOSIS — Z95810 Presence of automatic (implantable) cardiac defibrillator: Secondary | ICD-10-CM

## 2019-01-22 DIAGNOSIS — I4819 Other persistent atrial fibrillation: Secondary | ICD-10-CM

## 2019-01-22 DIAGNOSIS — I129 Hypertensive chronic kidney disease with stage 1 through stage 4 chronic kidney disease, or unspecified chronic kidney disease: Secondary | ICD-10-CM | POA: Diagnosis not present

## 2019-01-22 DIAGNOSIS — Z7901 Long term (current) use of anticoagulants: Secondary | ICD-10-CM

## 2019-01-22 DIAGNOSIS — E039 Hypothyroidism, unspecified: Secondary | ICD-10-CM | POA: Diagnosis not present

## 2019-01-22 DIAGNOSIS — E109 Type 1 diabetes mellitus without complications: Secondary | ICD-10-CM | POA: Diagnosis not present

## 2019-01-22 NOTE — Progress Notes (Signed)
Virtual Visit via Video Note    Evaluation Performed:  Follow-up visit  This visit type was conducted due to national recommendations for restrictions regarding the COVID-19 Pandemic (e.g. social distancing).  This format is felt to be most appropriate for this patient at this time.  All issues noted in this document were discussed and addressed.  No physical exam was performed (except for noted visual exam findings with Video Visits).  Please refer to the patient's chart (MyChart message for video visits and phone note for telephone visits) for the patient's consent to telehealth for [facility].  Date:  01/22/2019   ID:  [name], DOB 1934-02-14, MRN [id number]  Patient Location:  Home  Provider location:   [facility]  PCP:  [name], MD  Cardiologist:  Larae Grooms, MD  Electrophysiologist:  Thompson Grayer, MD   Chief Complaint:  Shortness of breath  History of Present Illness:    [name] is a 85 y.o. male who presents via audio/video conferencing for a telehealth visit today.    Who has a history of COPD on CPAP, left subclavian stenosis, CHF ( systolic, EF 28% in 7867), Afib s/p biventricular defibrillator, CAD s/p CABG, hypothyroidism, HTN, CVA.  Admitted 01/16/16 with acute resp. Failure with hypoxia acute COPD exacerbation RLL PNA, and acute systolic HF.Chronic systolic CHF: EF 67-20%  Paroxsymal Afib At discharge in 3/17: - Currently in AV paced rhythm on amiodarone. - CHADSVASC of 8, A/C with warfarin, theraputic AoCKD3- improving - ARB held,  -Resumed home oral lasix 40mg  BID  Helaterfailed attempts at restoring NSR with Dr. Rayann Heman. Ablation was done- see below.   Hehas hadbowel obstruction. He had some type of colon cleanse with relief.   He lost 15 lbs in 2017through portion control.   He had an AFib ablation and flutter ablation in 8/17.He has chronic DOE.  In the past, He has broken some ribs with a  fall on the left chest.  He was hospitalized in 11/19 for pneumonia.  Lost 70 lbs over the course of 2019.  2019 echo: Left ventricle: The cavity size was severely dilated. Systolic function was moderately to severely reduced. The estimated ejection fraction was in the range of 30% to 35%. Severe diffuse hypokinesis with distinct regional wall motion abnormalities. There is severe hypokinesis to akinesis of the inferolateral and inferior myocardium. There is hypokinesis of the lateral myocardium. There is akinesis of the apical septal, apical anterior and apical myocardium. - Aortic valve: Mildly to moderately calcified annulus. Trileaflet; normal thickness, moderately calcified leaflets. Valve area (VTI): 2.55 cm^2. Valve area (Vmax): 2.7 cm^2. Valve area (Vmean): 2.13 cm^2. - Aorta: Aortic root dimension: 39 mm (ED). - Aortic root: The aortic root was mildly dilated. - Mitral valve: There was mild regurgitation. Valve area by continuity equation (using LVOT flow): 2.29 cm^2. - Left atrium: The atrium was severely dilated. - Right atrium: The atrium was mildly dilated. - Pulmonic valve: There was trivial regurgitation. - Pulmonary arteries: PA peak pressure: 45 mm Hg (S).  Impressions:  - The right ventricular systolic pressure was increased consistent with moderate pulmonary hypertension.   Spironolactone was stopped after he had low BP.  BP has been better.  Coreg was also decreased.    The patient does not symptoms concerning for COVID-19 infection (fever, chills, cough, or new shortness of breath).   Stable cough and DOE.   He was hospitalized for a few days in March 2020.  He had dehydration: 85 year old  male with a history of atrial fibrillation, CAD, chronic systolic heart failure, COPD, sleep apnea, hypothyroidism presented to the hospital with near syncope. Patient says he did not lose consciousness.   Hospital Course:   1. ?  Near syncope-patient denies losing consciousness. He does have a history of ischemic cardiomyopathy status post CABG with LV function 30 to 35% with severe diffuse hypokinesis. Lasix dose has been adjusted to once daily.  2. Chest wall pain/muscular- improved, tenderness to palpation on left lower rib cage,  Was startedFlexeril 5 mg p.o. 3 times daily.   3. Acute on chronic systolic CHF- resolved with IV Lasix, O2 sats are 92% on ambulation on RA,chest x-ray shows bilateral pulmonary edema, no signs and symptoms of pneumonia. Patient was started on IV Lasix and has diuresed,  net -4.3L .IV Lasix was helddue to hypotension. Started onLasix 40 mg p.o. daily.Cardiology recommends to discharge home on Lasix 40 mg daily.  4. Hypertension-orthostatic vital signs were negative for postural hypotension. Continue Coreg.  5. Diabetes mellitus type 2-continue sliding scale insulin with NovoLog. CBG well controlled.  6. Paroxysmal atrial fibrillation-heart rate is controlled, continue amiodarone 200 mg p.o. daily. Patient was taking Coumadin at home. Continue coumadin at home.  7. Hypothyroidism-continue Synthroid  8. Dyslipidemia-continue atorvastatin.  He has had some difficulty swallowing.  Now on a more liquid diet.   His left chest pain has persisted.  It is worse when he lies on his left side and coughs.   Denies :  Dizziness. Leg edema. Nitroglycerin use. Orthopnea. Palpitations. Paroxysmal nocturnal dyspnea. Syncope.   He does some exercises at home.  He walks in the house.    Prior CV studies:   The following studies were reviewed today: Hospital records reviewed   Past Medical History:  Diagnosis Date   Atrial fibrillation ([place])    persistent, previously seen at [facility] and placed on amiodarone   Benign prostatic hypertrophy    CAD (coronary artery disease)    multivessel s/p inferolateral wall MI with subsequent CABG 11/1998.  Cath 2009 with Patent grafts    Cleft palate    COPD with emphysema ([place]) 04/01/2010   DM (diabetes mellitus), type 2 (HCC)    Dyspnea    GERD (gastroesophageal reflux disease)    HTN (hypertension)    Hyperlipidemia    Hypothyroidism    Iron deficiency anemia    Ischemic dilated cardiomyopathy (HCC)    EF 35-40% by MUGA 6/11   Myocardial infarction ([place])    Nasal septal deviation    Nephrolithiasis    OSA (obstructive sleep apnea)    PAF (paroxysmal atrial fibrillation) (HCC)    Peripheral arterial disease (HCC)    left subclavian artery stenosis   PNA (pneumonia)    Psoriasis    Seborrheic keratosis    Stroke ([place])    Systolic congestive heart failure ([place]) 2009   s/p BiV ICD implantation by Dr Leonia Reeves (MDT)   Past Surgical History:  Procedure Laterality Date   BI-VENTRICULAR IMPLANTABLE CARDIOVERTER DEFIBRILLATOR  (CRT-D)  10-08-08; 11-06-2013   Dr Leonia Reeves (MDT) implant for primary prevention; gen change to MDT VivaXT CRTD by Dr Rayann Heman   BIV ICD [facility] CHANGE OUT N/A 11/06/2013   Procedure: BIV ICD GENERTAOR CHANGE OUT;  Surgeon: Coralyn Mark, MD;  Location: [facility] CATH LAB;  Service: Cardiovascular;  Laterality: N/A;   c-spine surgery     CARDIOVERSION N/A 04/29/2016   Procedure: CARDIOVERSION;  Surgeon: Pixie Casino, MD;  Location: [place];  Service: Cardiovascular;  Laterality: N/A;   CARPAL TUNNEL RELEASE     CATARACT EXTRACTION     CORONARY ARTERY BYPASS GRAFT     LIMA to LAD, SVG to OM, SVG to diagonal   ELECTROPHYSIOLOGIC STUDY N/A 06/11/2016   Procedure: Atrial Fibrillation Ablation;  Surgeon: Thompson Grayer, MD;  Location: [place] CV LAB;  Service: Cardiovascular;  Laterality: N/A;   left cleft palate and left cleft lip repair       Current Meds  Medication Sig   acetaminophen (TYLENOL) 500 MG tablet Take 1,000 mg by mouth at bedtime as needed for mild pain or moderate pain.   albuterol (PROVENTIL) (2.5 MG/3ML) 0.083% nebulizer solution USE 1 VIAL VIA  NEBULIZER 3 TIMES A DAY AS DIRECTED. DX:J44.9 (Patient taking differently: Take 2.5 mg by nebulization 3 (three) times daily. )   amiodarone (PACERONE) 400 MG tablet TAKE HALF TABLET (200 MG TOTAL) BY MOUTH DAILY. (Patient taking differently: Take 200 mg by mouth 2 (two) times daily. )   atorvastatin (LIPITOR) 80 MG tablet TAKE 1 TABLET (80 MG TOTAL) BY MOUTH DAILY. (Patient taking differently: Take 80 mg by mouth every morning. )   carvedilol (COREG) 6.25 MG tablet Take 0.5 tablets (3.125 mg total) by mouth 2 (two) times daily. Take if systolic blood pressure >811 (Patient taking differently: Take 3.125 mg by mouth See admin instructions. Take if systolic blood pressure >914 take 3.125 mg and if below he does not take any thing)   cholecalciferol (VITAMIN D) 1000 units tablet Take 2,000 Units by mouth every evening.   dextromethorphan-guaiFENesin (MUCINEX DM) 30-600 MG 12hr tablet Take 1 tablet by mouth 2 (two) times daily as needed for cough.   feeding supplement, ENSURE ENLIVE, (ENSURE ENLIVE) LIQD Take 237 mLs by mouth 2 (two) times daily between meals.   ferrous sulfate 325 (65 FE) MG tablet Take 325 mg by mouth every evening.    fluticasone (FLONASE) 50 MCG/ACT nasal spray SPRAY 2 SPRAYS INTO EACH NOSTRIL EVERY DAY (Patient taking differently: Place 2 sprays into both nostrils every morning. )   furosemide (LASIX) 40 MG tablet Take 1 tablet (40 mg total) by mouth daily.   Insulin Degludec-Liraglutide (XULTOPHY) 100-3.6 UNIT-MG/ML SOPN Inject 16 Units into the skin every morning.   levothyroxine (SYNTHROID, LEVOTHROID) 100 MCG tablet Take 100 mcg by mouth daily before breakfast.    Multiple Vitamins-Minerals (PRESERVISION AREDS PO) Take 1 tablet by mouth 2 (two) times daily.   potassium chloride 20 MEQ TBCR Take 20 mEq by mouth daily.   PRESCRIPTION MEDICATION Inhale into the lungs at bedtime. CPAP   senna (SENOKOT) 8.6 MG tablet Take 1 tablet by mouth 2 (two) times daily.     traMADol (ULTRAM) 50 MG tablet Take 50 mg by mouth at bedtime as needed for moderate pain or severe pain.    TRELEGY ELLIPTA 100-62.5-25 MCG/INH AEPB INHALE 1 PUFF INTO LUNGS ONCE A DAY (Patient taking differently: Take 1 puff by mouth every morning. )   vitamin B-12 (CYANOCOBALAMIN) 1000 MCG tablet Take 1,000 mcg by mouth daily.   warfarin (COUMADIN) 5 MG tablet TAKE AS DIRECTED BY COUMADIN CLINIC (Patient taking differently: Take 5 mg by mouth daily. )     Allergies:   Aldactone [spironolactone]   Social History   Tobacco Use   Smoking status: Former Smoker    Packs/day: 3.00    Years: 65.00    Pack years: 195.00    Types: Cigarettes    Last attempt to quit:  10/25/1998    Years since quitting: 20.2   Smokeless tobacco: Never Used  Substance Use Topics   Alcohol use: No    Alcohol/week: 0.0 standard drinks    Comment: remote history of heavy alcohol use   Drug use: No     Family Hx: The patient's family history includes Asthma in his father; Heart attack in his brother; Heart disease in his brother; Hypertension in his brother, father, mother, and sister; Stroke in his father.  ROS:   Please see the history of present illness.    Left sided chest pain when he raises his left arm. All other systems reviewed and are negative.   Labs/Other Tests and Data Reviewed:    Recent Labs: 03/13/2018: NT-Pro BNP 5,305 07/13/2018: TSH 0.454 09/20/2018: Magnesium 2.1 01/08/2019: ALT 16 01/09/2019: B Natriuretic Peptide 569.8 01/10/2019: Hemoglobin 11.0; Platelets 218 01/13/2019: BUN 22; Creatinine, Ser 1.11; Potassium 4.2; Sodium 138   Recent Lipid Panel Lab Results  Component Value Date/Time   CHOL 137 11/12/2014 09:21 AM   TRIG 95 11/12/2014 09:21 AM   HDL 50 11/12/2014 09:21 AM   CHOLHDL 2.6 11/29/2008 03:30 AM   LDLCALC 68 11/12/2014 09:21 AM    Wt Readings from Last 3 Encounters:  01/22/19 187 lb (84.8 kg)  01/14/19 190 lb 4.8 oz (86.3 kg)  10/04/18 196 lb 3.4 oz (89  kg)     Exam:    Vital Signs:  BP (!) 104/56    Pulse 92    Ht 5\' 6"  (1.676 m)    Wt 187 lb (84.8 kg)    BMI 30.18 kg/m    Well nourished, well developed male in no acute distress. Exam limited due to video conferencing.  Appears to be breathing comfortably.  ASSESSMENT & PLAN:    1.  CAD: No angina.  Continue aggressive secondary prevention.  Chest pain does not sound cardiac, more MSK.  OK to use heating pad and extra tylenol tab.   2..Hyperlipidemia: Checked with PMD. COntinue statin.  3.  DM: Continue aggressive medical Rx for DM.   4.  Chronic systolic heart failure: appears euvolemic.  Avoid hypotesnion.  If BP is low, he will hold Lasix.   5.  Pacer: Functioning well by ECG on 01/12/2019.   Using CPAP for OSA.   AFib: Coumadin stable dosing.   COVID-19 Education: The signs and symptoms of COVID-19 were discussed with the patient and how to seek care for testing (follow up with PCP or arrange E-visit).  The importance of social distancing was discussed today.  Patient Risk:   After full review of this patients clinical status, I feel that they are at least moderate risk at this time.  Time:   Today, I have spent 25 minutes with the patient with telehealth technology discussing his hospital stay.     Medication Adjustments/Labs and Tests Ordered: Current medicines are reviewed at length with the patient today.  Concerns regarding medicines are outlined above.  Tests Ordered: No orders of the defined types were placed in this encounter.  Medication Changes: No orders of the defined types were placed in this encounter.   Disposition:  Follow up in 6 month(s)  Signed, Larae Grooms, MD  01/22/2019 12:48 PM    [place]

## 2019-01-22 NOTE — Patient Instructions (Signed)
Medication Instructions:  Your physician recommends that you continue on your current medications as directed. Please refer to the Current Medication list given to you today.  If you need a refill on your cardiac medications before your next appointment, please call your pharmacy.   Lab work: None Ordered  If you have labs (blood work) drawn today and your tests are completely normal, you will receive your results only by: [name] MyChart Message (if you have MyChart) OR . A paper copy in the mail If you have any lab test that is abnormal or we need to change your treatment, we will call you to review the results.  Testing/Procedures: None ordered  Follow-Up: At [facility], you and your health needs are our priority.  As part of our continuing mission to provide you with exceptional heart care, we have created designated Provider Care Teams.  These Care Teams include your primary Cardiologist (physician) and Advanced Practice Providers (APPs -  Physician Assistants and Nurse Practitioners) who all work together to provide you with the care you need, when you need it. . You will need a follow up appointment in 6 months.  Please call our office 2 months in advance to schedule this appointment.  You may see Casandra Doffing, MD or one of the following Advanced Practice Providers on your designated Care Team:   . Lyda Jester, PA-C . Dayna Dunn, PA-C . Ermalinda Barrios, PA-C  Any Other Special Instructions Will Be Listed Below (If Applicable).  You may take tylenol and use a heating pad for your rib pain

## 2019-01-23 NOTE — Progress Notes (Signed)
EPIC Encounter for ICM Monitoring  Patient Name: [name] is a 85 y.o. male Date: 01/23/2019 Primary Care Physican: [name], MD Primary [place] Electrophysiologist: Allred BiV Pacing:98.7% Last Weight:189lbs 01/24/2019 Weight:187 lbs   Clinical Status (22-Jan-2019 to 22-Jan-2019)  Treated VT/VF0 episodes  AT/AF     1 episode  AT/AF(100.0%)  Observations (2) (22-Jan-2019 to 22-Jan-2019)  AT/AF >= 6 hr for 1 days.  V. Pacing less than 90%       Call to wife.  Heart failure questions reviewed and does not have any fluid symptoms at this time.  Hospitalized 3/16-3/22 for near syncope, dehydration  Thoracic impedancenormal.  Prescribed:Furosemide40 mg take 1 tablet daily.  Labs: 01/13/2019 Creatinine 1.11, BUN 22, Potassium 4.2, Sodium 138, GFR >60 01/12/2019 Creatinine 1.35, BUN 22, Potassium 3.6, Sodium 138, GFR 48-56  01/11/2019 Creatinine 1.17, BUN 19, Potassium 3.8, Sodium 135, GFR 57->60  01/10/2019 Creatinine 1.08, BUN 21, Potassium 3.9, Sodium 137, GFR >60  01/09/2019 Creatinine 1.22, BUN 23, Potassium 4.0, Sodium 140, GFR 54>60  01/08/2019 Creatinine 1.44, BUN 24, Potassium 4.2, Sodium 138, GFR 45-52  A complete set of results can be found in Results Review.  Recommendations:No changes  Follow-up plan: ICM clinic phone appointment on 02/05/2019 recheck post hospitalization   Copy of ICM check sent to Dr. Rayann Heman and Dr Irish Lack.   3 month ICM trend: 01/22/2019    1 Year ICM trend:       Rosalene Billings, RN 01/23/2019 8:16 AM

## 2019-01-25 DIAGNOSIS — I5023 Acute on chronic systolic (congestive) heart failure: Secondary | ICD-10-CM | POA: Diagnosis not present

## 2019-01-25 DIAGNOSIS — R55 Syncope and collapse: Secondary | ICD-10-CM | POA: Diagnosis not present

## 2019-01-25 DIAGNOSIS — E1122 Type 2 diabetes mellitus with diabetic chronic kidney disease: Secondary | ICD-10-CM | POA: Diagnosis not present

## 2019-01-25 DIAGNOSIS — J449 Chronic obstructive pulmonary disease, unspecified: Secondary | ICD-10-CM | POA: Diagnosis not present

## 2019-01-25 DIAGNOSIS — I129 Hypertensive chronic kidney disease with stage 1 through stage 4 chronic kidney disease, or unspecified chronic kidney disease: Secondary | ICD-10-CM | POA: Diagnosis not present

## 2019-01-26 ENCOUNTER — Other Ambulatory Visit: Payer: Self-pay

## 2019-01-26 NOTE — Patient Outreach (Signed)
  [facility]) Care Management Chronic Special Needs Program    01/26/2019  Name: [name], DOB: 1934-05-30  MRN: [id number]   [name] is enrolled in a chronic special needs plan for Heart Failure.  RNCM received notification: utilization management request for pharmacy referral: "member's wife voiced that this member's medications are costly; states CSNP program helps but also medications like preservision is costly-agrees to pharmacy referral".  Plan: pharmacy referral placed, continue to follow.  Thea Silversmith, RN, MSN, [place] [phone]

## 2019-01-29 ENCOUNTER — Other Ambulatory Visit: Payer: Self-pay | Admitting: Pharmacist

## 2019-01-29 NOTE — Patient Outreach (Signed)
[facility]) Care Management  [place]   01/29/2019  [name] 12-Apr-1934 [id number]  Reason for referral: Medication Assistance Referral source: [facility] RN with HTA C-SNP Current insurance:[employer]  PMHx includes but not limited to:  T2DM, COPD, atrial fibrillation on warfarin, CAD, CHF (EF 30-35% 11/'19), HTN, stroke  2nd referral in last 30 days received for patient for medication assistance.  Patient previously outreached and medications reviews (please see note written 3/11 for full details).  Patient's PCP is Dr. Laurann Montana with PharmD Cherre Robins in office.    Plan: Will send notice to Dr. Cherre Robins regarding referral for patient.   Will close [facility] pharmacy case and update [facility] RN Rex Kras, PharmD, [place] [phone]

## 2019-02-02 ENCOUNTER — Telehealth: Payer: Self-pay

## 2019-02-02 NOTE — Telephone Encounter (Signed)

## 2019-02-04 IMAGING — CR DG CERVICAL SPINE 2 OR 3 VIEWS
4 series · 4 of 4 positions shown · non-contrast
Comparison: None.

CLINICAL DATA: Pain after fall

EXAM:
CERVICAL SPINE - 2-3 VIEW

[w cervical spine ap]
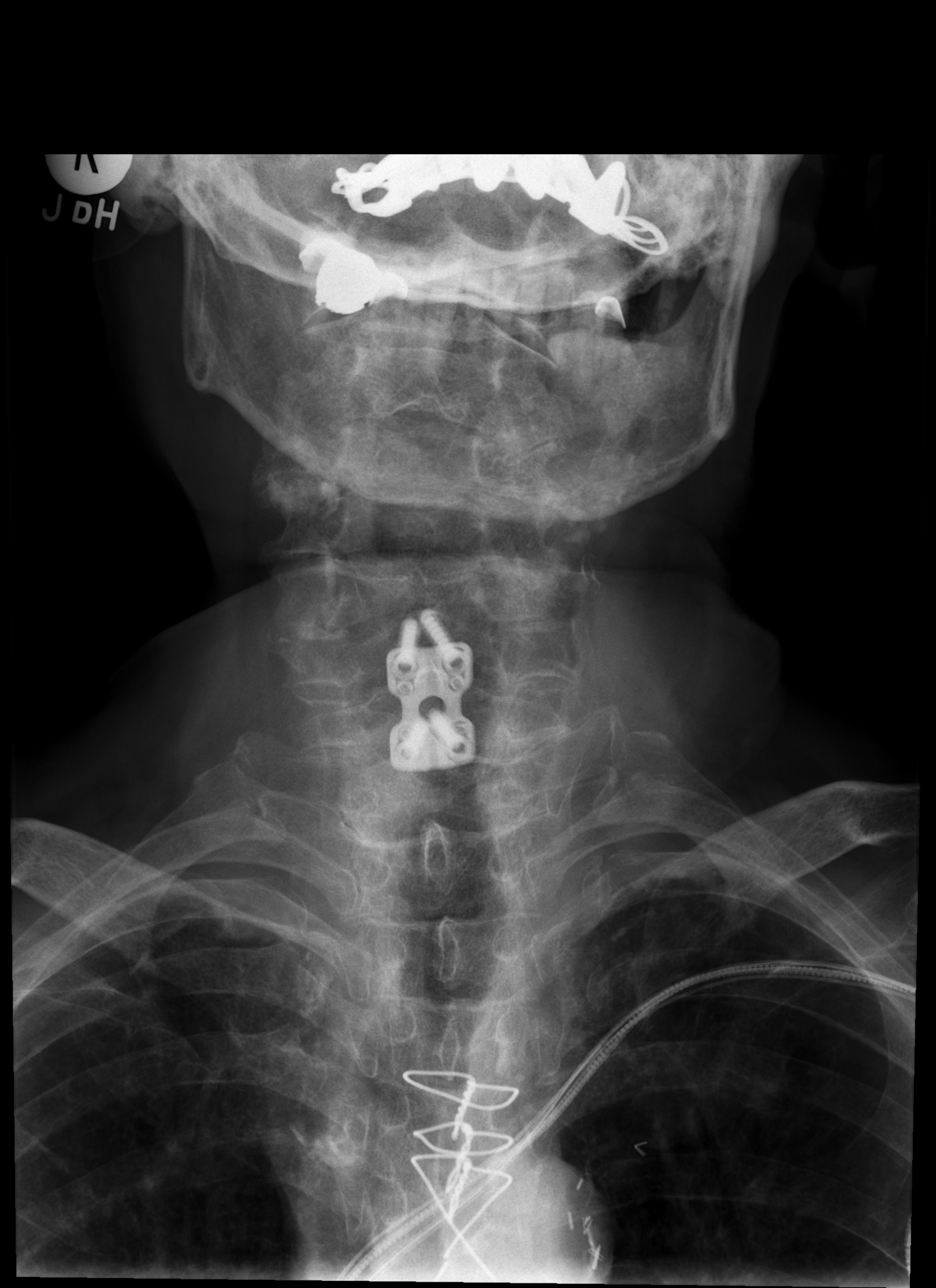

[w cervical spine lat]
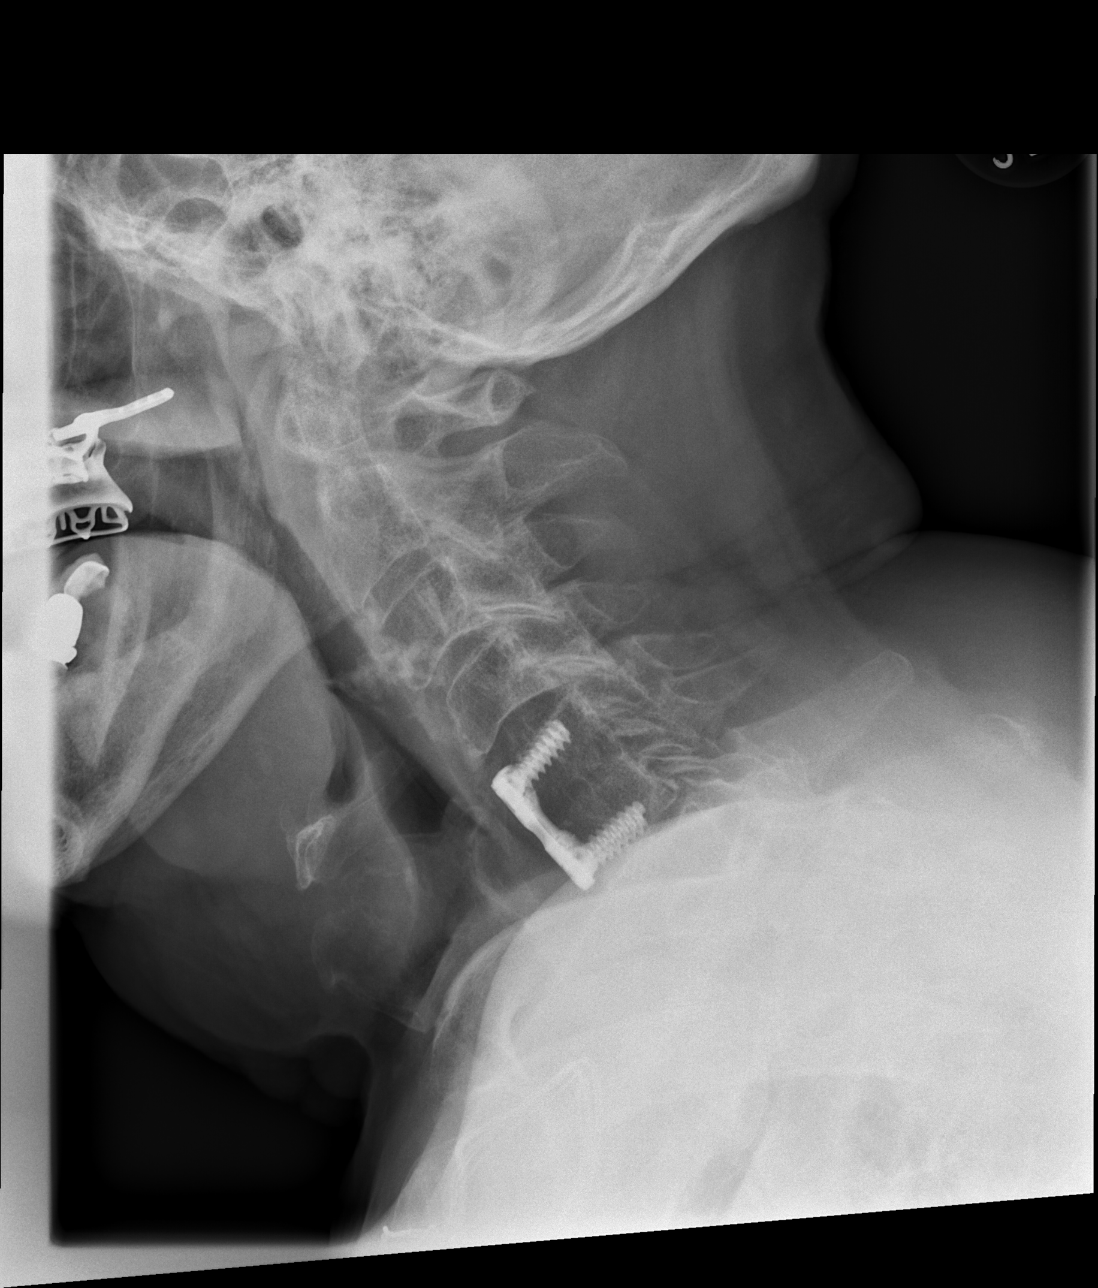

[w cervical swimmers]
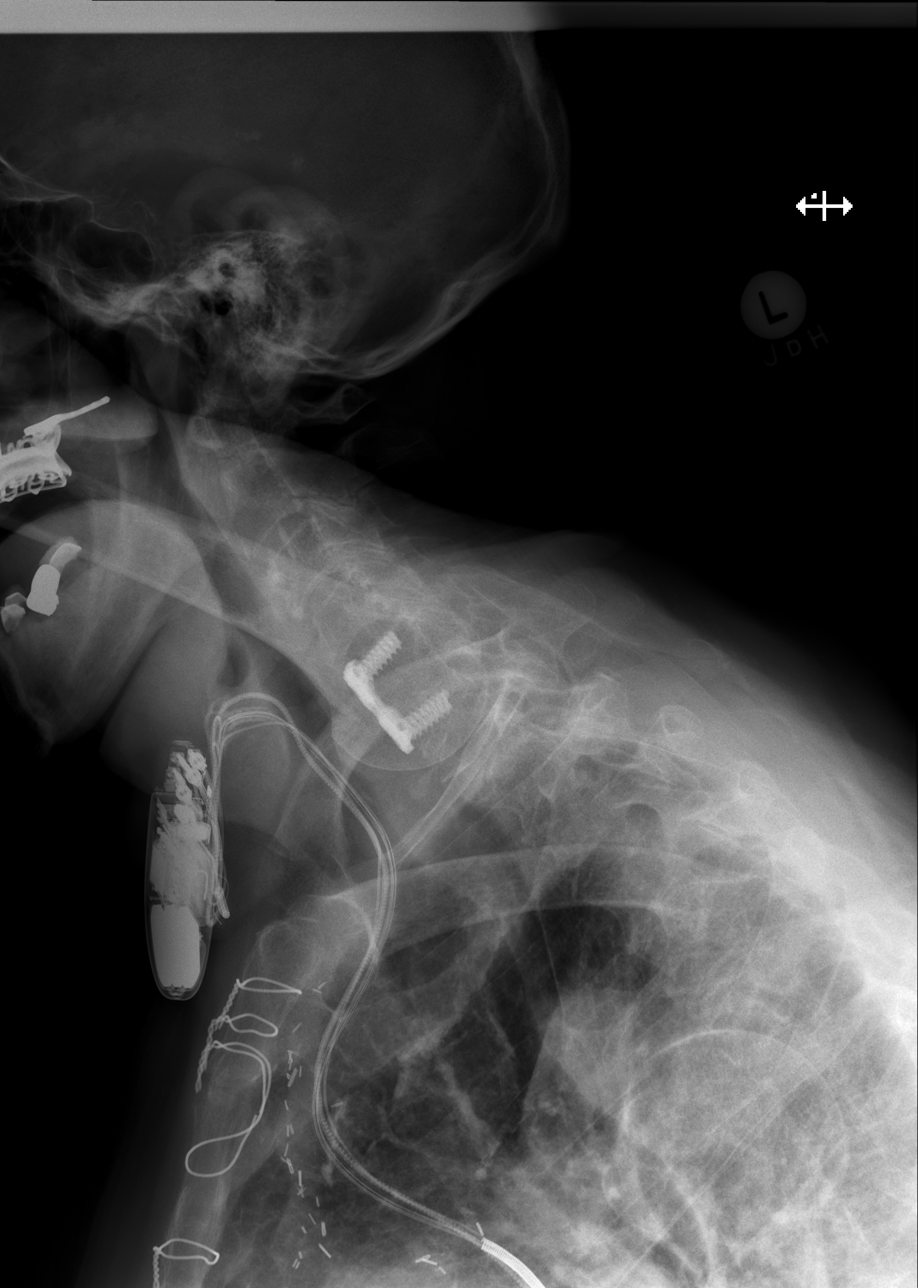

[w cervical spine odontoid]
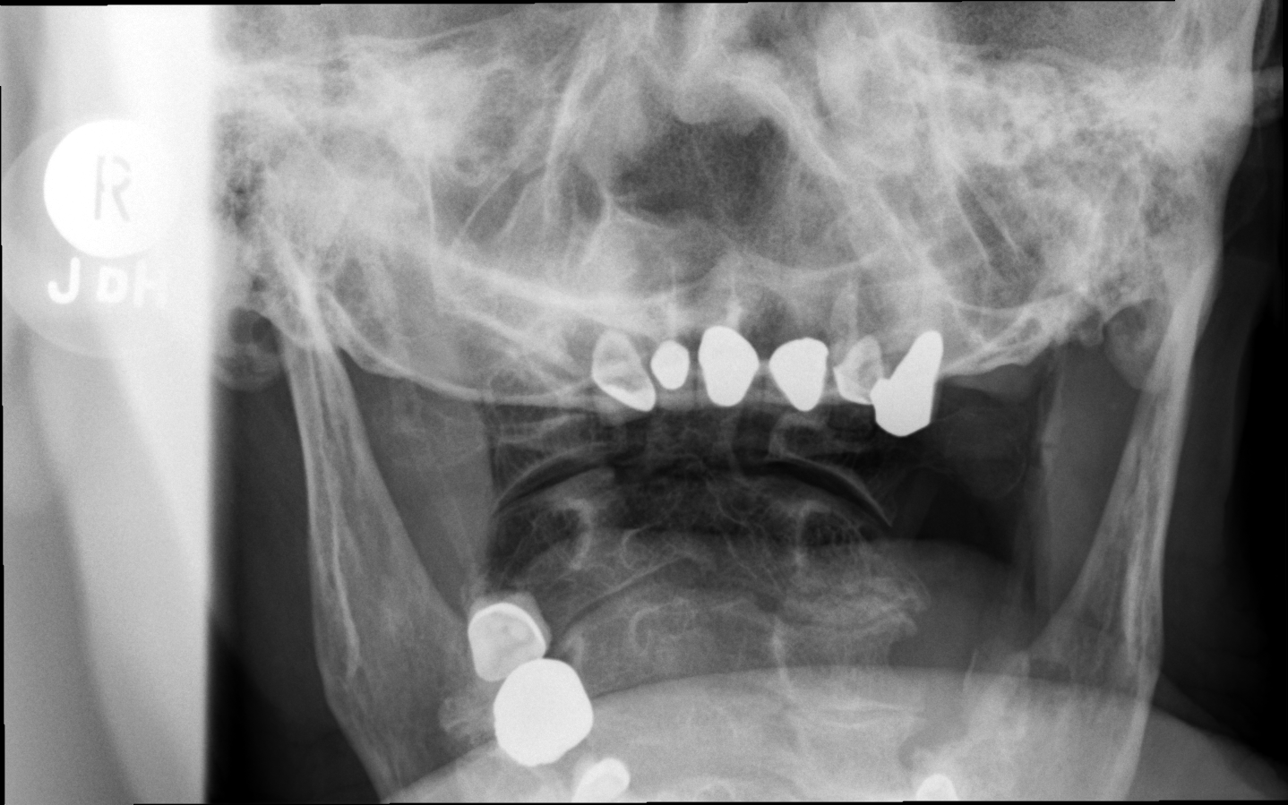

[4 of 4 positions shown; findings below may reference images not displayed]

FINDINGS: Anterior plate and screw fusion of C5 and C6. The cervical spine is
only well seen to the bottom of C6. No malalignment within this
region. The pre odontoid space and prevertebral soft tissues are
normal. Mild degenerative changes. No fractures are seen. The
lateral masses of C1 align with C2. The odontoid process is
unremarkable.
IMPRESSION: Postsurgical changes in the cervical spine. The cervical spine is
only well seen to the bottom of C6. No fracture or traumatic
malalignment within visualize limits.

## 2019-02-04 IMAGING — CR DG CLAVICLE*R*
2 series · 2 of 2 positions shown · non-contrast
Comparison: None.

CLINICAL DATA: Pain after fall

EXAM:
RIGHT CLAVICLE - 2+ VIEWS

[w clavicle ap right]
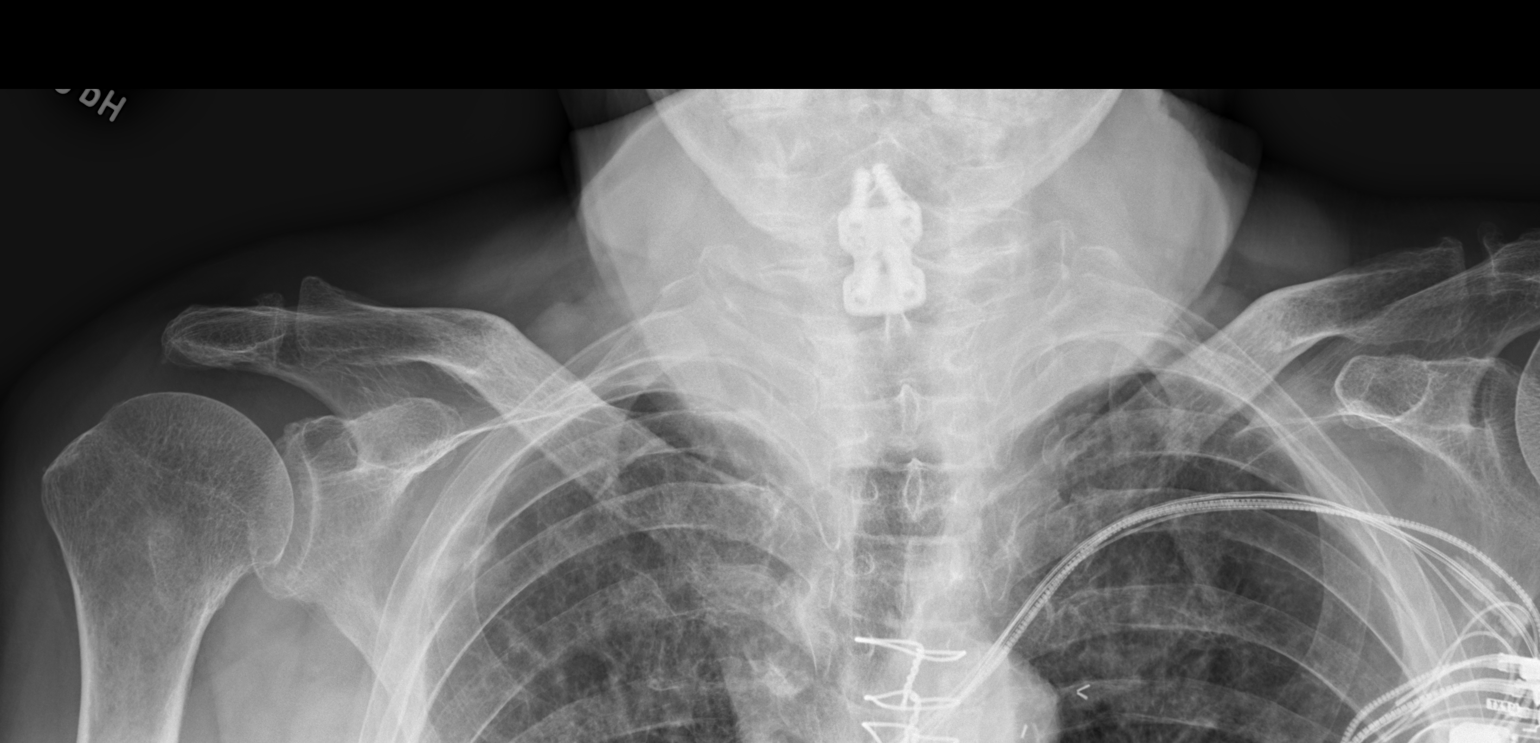

[w clavicle tangential right]
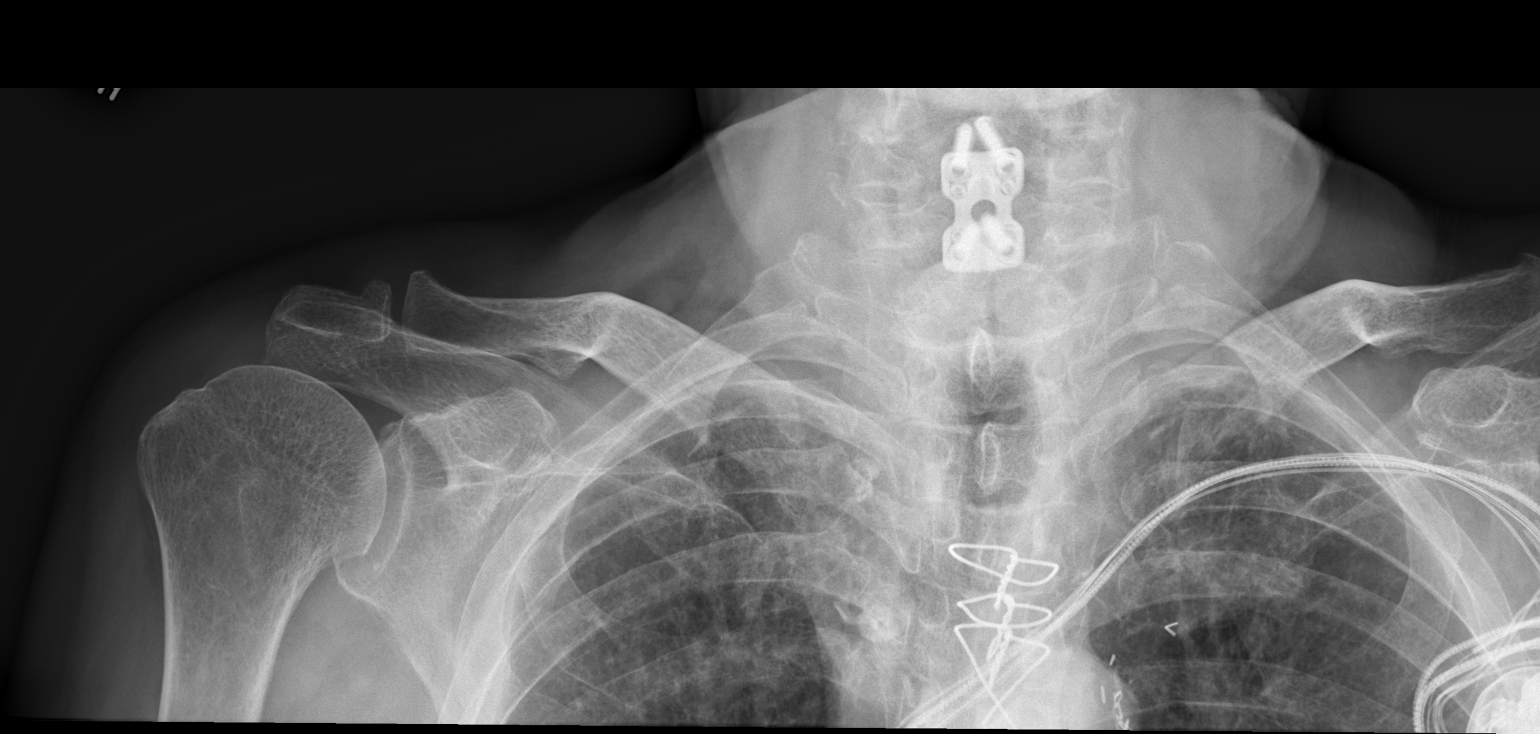

[2 of 2 positions shown; findings below may reference images not displayed]

FINDINGS: There is no evidence of fracture or other focal bone lesions. Soft
tissues are unremarkable.
IMPRESSION: No acute fracture identified.

## 2019-02-04 IMAGING — CR DG NECK SOFT TISSUE
2 series · 2 of 2 positions shown · non-contrast
Comparison: None.

CLINICAL DATA: Pain after trauma

EXAM:
NECK SOFT TISSUES - 1+ VIEW

[w soft tissue neck ap]
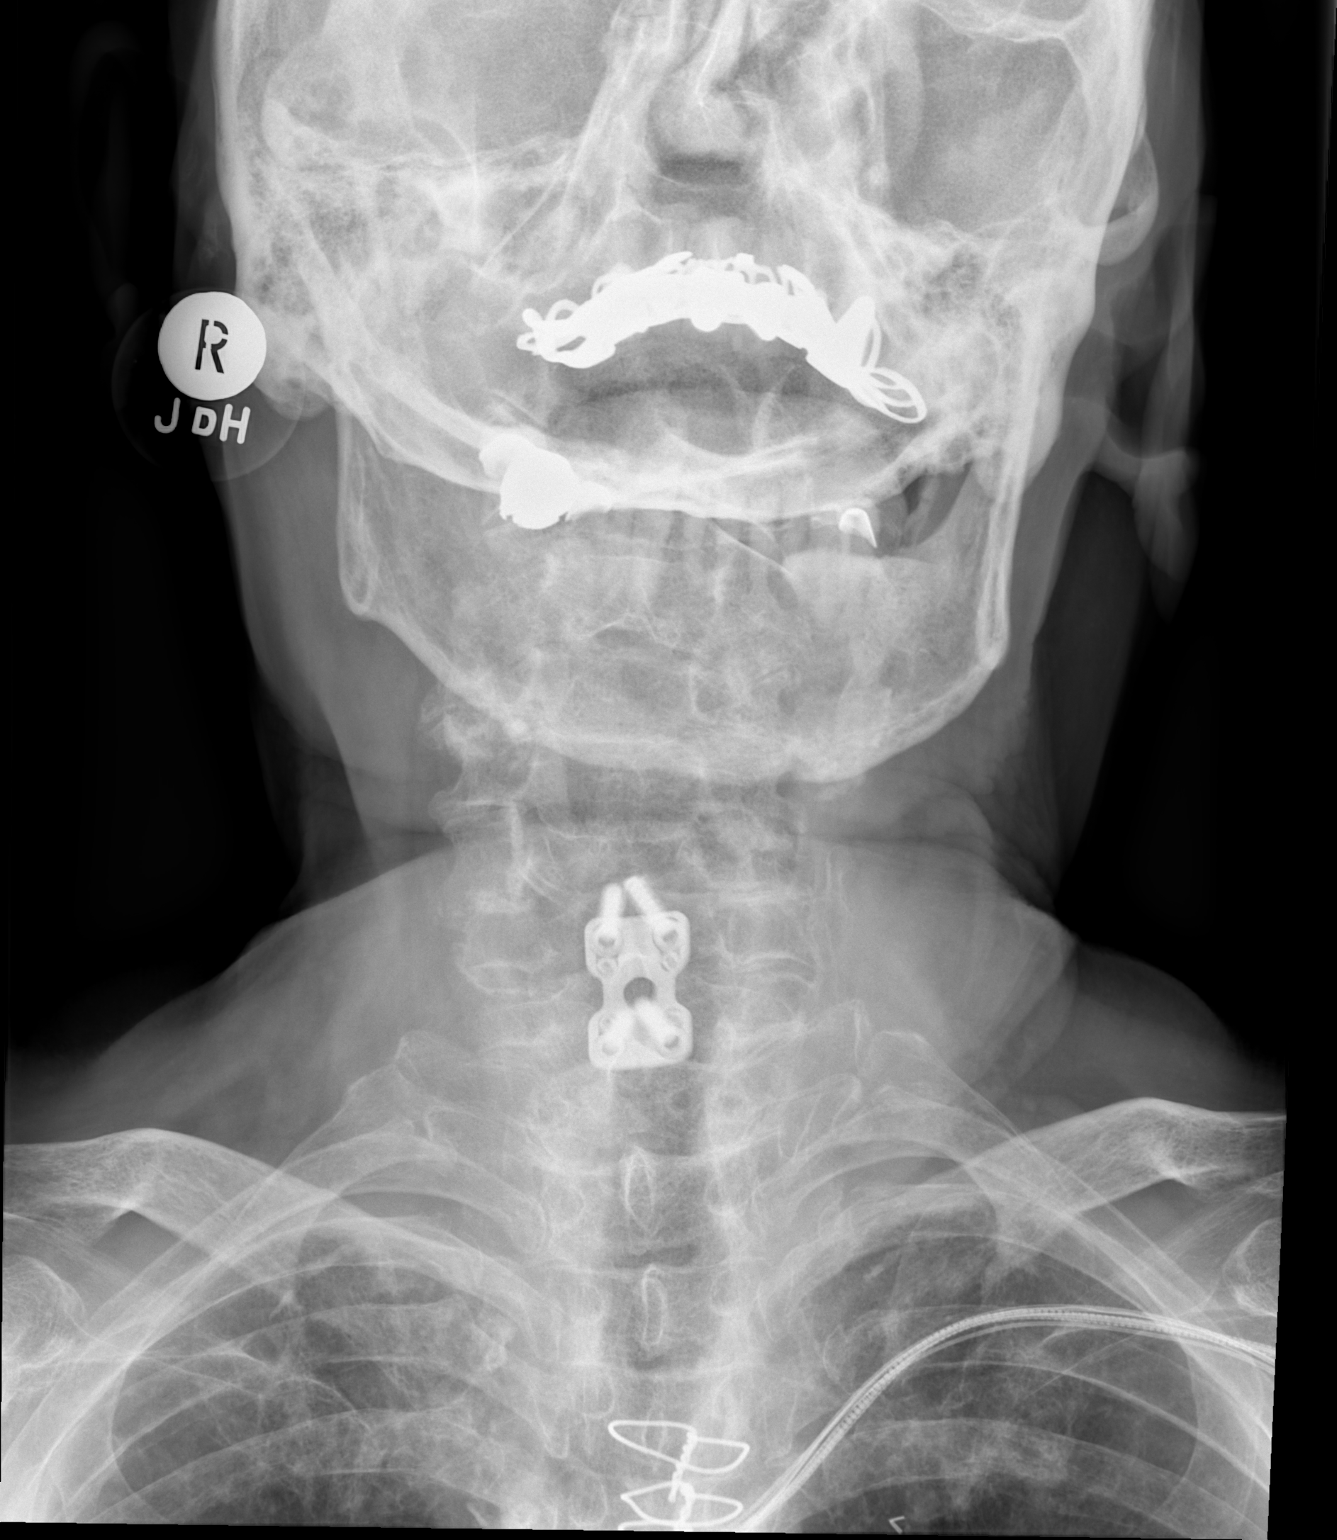

[w soft tissue neck lat]
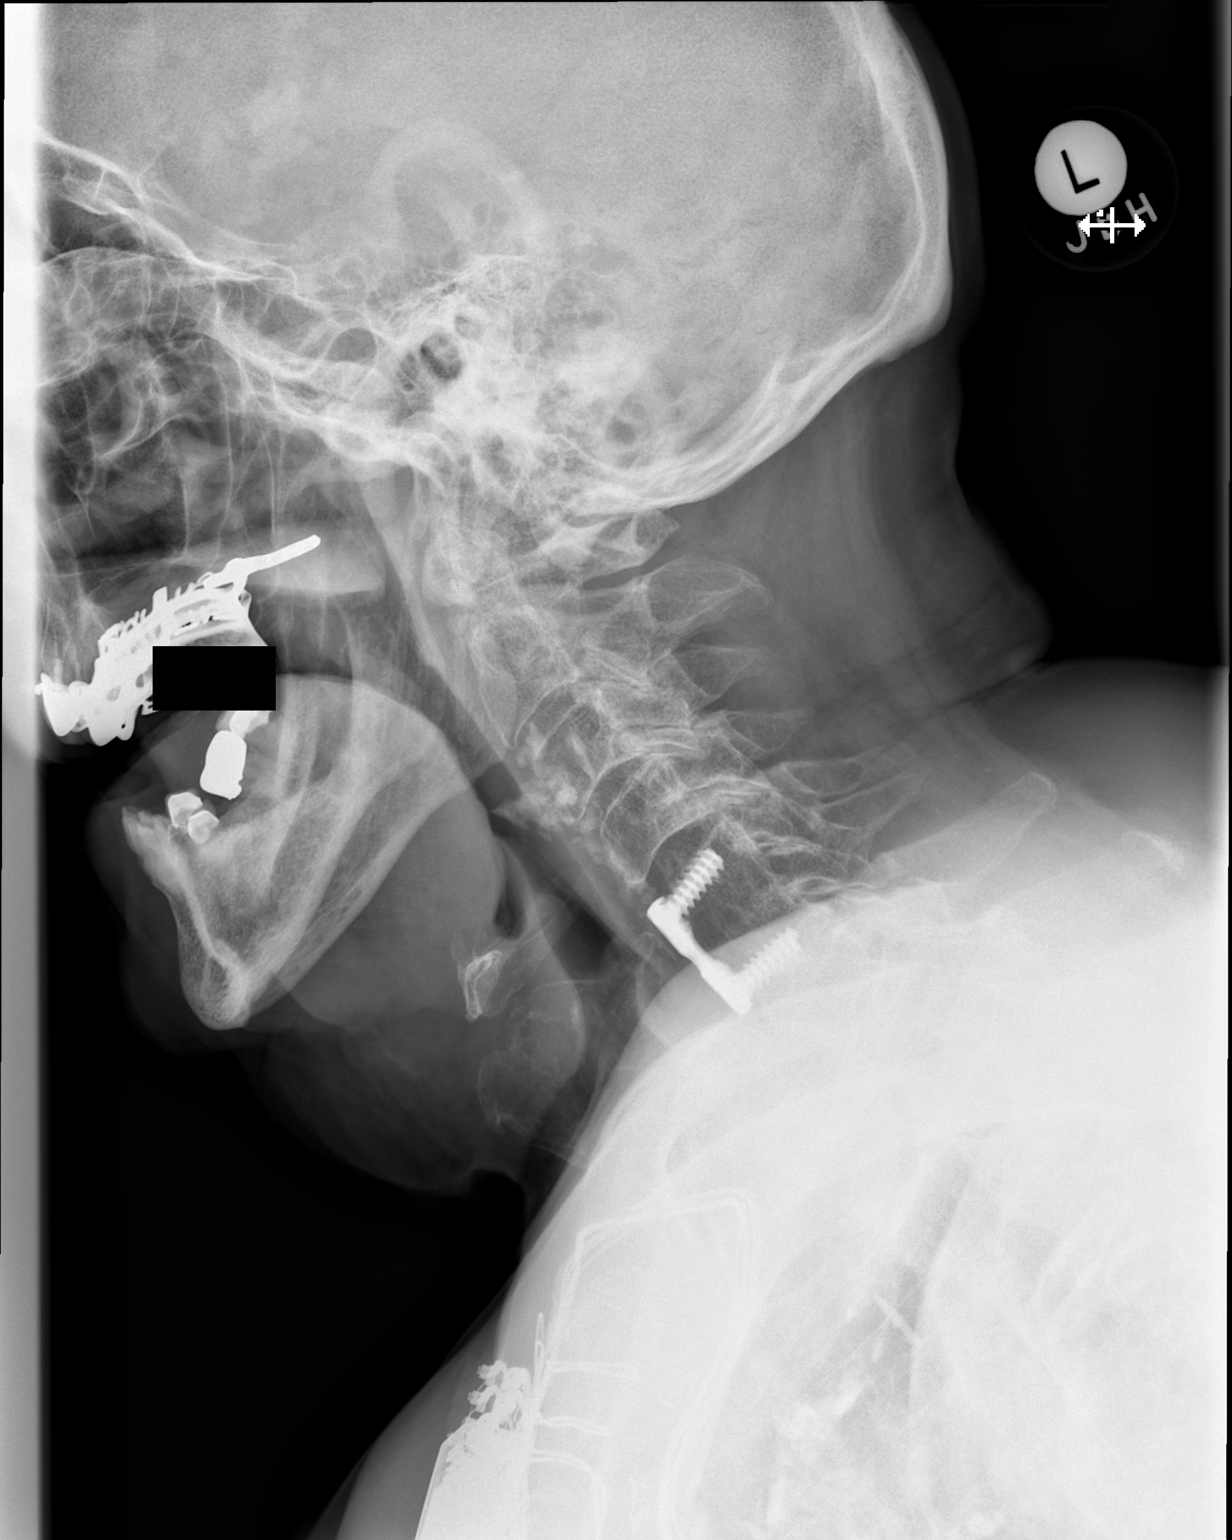

[2 of 2 positions shown; findings below may reference images not displayed]

FINDINGS: Anterior plate and screw fusion of C5 and C6. The soft tissues of
the neck are grossly unremarkable.
IMPRESSION: No soft tissue abnormalities.

## 2019-02-05 ENCOUNTER — Other Ambulatory Visit: Payer: Self-pay

## 2019-02-05 ENCOUNTER — Ambulatory Visit (INDEPENDENT_AMBULATORY_CARE_PROVIDER_SITE_OTHER): Payer: HMO

## 2019-02-05 DIAGNOSIS — I5022 Chronic systolic (congestive) heart failure: Secondary | ICD-10-CM

## 2019-02-05 DIAGNOSIS — I480 Paroxysmal atrial fibrillation: Secondary | ICD-10-CM

## 2019-02-05 DIAGNOSIS — Z7901 Long term (current) use of anticoagulants: Secondary | ICD-10-CM | POA: Diagnosis not present

## 2019-02-05 DIAGNOSIS — Z5181 Encounter for therapeutic drug level monitoring: Secondary | ICD-10-CM

## 2019-02-05 DIAGNOSIS — I4819 Other persistent atrial fibrillation: Secondary | ICD-10-CM

## 2019-02-05 DIAGNOSIS — Z95810 Presence of automatic (implantable) cardiac defibrillator: Secondary | ICD-10-CM

## 2019-02-05 LAB — POCT INR: INR: 2.4 (ref 2.0–3.0)

## 2019-02-05 NOTE — Patient Instructions (Signed)
Please continue dosage 1 TABLET EVERY DAY.    Recheck in 5 weeks in [place].

## 2019-02-06 ENCOUNTER — Telehealth: Payer: Self-pay

## 2019-02-06 ENCOUNTER — Other Ambulatory Visit: Payer: Self-pay

## 2019-02-06 NOTE — Telephone Encounter (Signed)
Remote ICM transmission received.  Attempted call to wife regarding ICM remote transmission and left detailed message, per DPR, with next ICM remote transmission date of 03/12/2019.  Advised to return call for any fluid symptoms or questions.

## 2019-02-06 NOTE — Progress Notes (Signed)
EPIC Encounter for ICM Monitoring  Patient Name: [name] is a 85 y.o. male Date: 02/06/2019 Primary Care Physican: [name], MD Primary [place] Electrophysiologist: Allred BiV Pacing:98.7% 01/24/2019 Weight:187 lbs  Clinical Status (22-Jan-2019 to 05-Feb-2019)  Treated VT/VF 0 episodes   AT/AF 5 episodes   Time in AT/AF 9.3 hr/day (38.7%)   Longest AT/AF 5 days Observations (3) (22-Jan-2019 to 05-Feb-2019)  AT/AF >= 6 hr for 6 days.  V. Pacing less than 90%.  Patient Activity less than 1 hr/day for 2 weeks.     Attempted call to wife and unable to reach.  Left detailed message per DPR regarding transmission. Transmission reviewed.        Thoracic impedancenormal.  Prescribed:Furosemide40 mg take 1 tablet daily.  Labs: 01/13/2019 Creatinine 1.11, BUN 22, Potassium 4.2, Sodium 138, GFR >60 01/12/2019 Creatinine 1.35, BUN 22, Potassium 3.6, Sodium 138, GFR 48-56  01/11/2019 Creatinine 1.17, BUN 19, Potassium 3.8, Sodium 135, GFR 57->60  01/10/2019 Creatinine 1.08, BUN 21, Potassium 3.9, Sodium 137, GFR >60  01/09/2019 Creatinine 1.22, BUN 23, Potassium 4.0, Sodium 140, GFR 54>60  01/08/2019 Creatinine 1.44, BUN 24, Potassium 4.2, Sodium 138, GFR 45-52  A complete set of results can be found in Results Review.  Recommendations: Left voice mail with ICM number and encouraged to call if experiencing any fluid symptoms.  Follow-up plan: ICM clinic phone appointment on 03/12/2019.  Copy of ICM check sent to Dr.Allred.   3 month ICM trend: 02/05/2019    1 Year ICM trend:       Rosalene Billings, RN 02/06/2019 9:38 AM

## 2019-02-19 DIAGNOSIS — J449 Chronic obstructive pulmonary disease, unspecified: Secondary | ICD-10-CM | POA: Diagnosis not present

## 2019-02-19 DIAGNOSIS — I480 Paroxysmal atrial fibrillation: Secondary | ICD-10-CM | POA: Diagnosis not present

## 2019-02-19 DIAGNOSIS — E1122 Type 2 diabetes mellitus with diabetic chronic kidney disease: Secondary | ICD-10-CM | POA: Diagnosis not present

## 2019-02-19 DIAGNOSIS — N183 Chronic kidney disease, stage 3 (moderate): Secondary | ICD-10-CM | POA: Diagnosis not present

## 2019-02-19 DIAGNOSIS — I5023 Acute on chronic systolic (congestive) heart failure: Secondary | ICD-10-CM | POA: Diagnosis not present

## 2019-02-19 DIAGNOSIS — N400 Enlarged prostate without lower urinary tract symptoms: Secondary | ICD-10-CM | POA: Diagnosis not present

## 2019-02-19 DIAGNOSIS — E1151 Type 2 diabetes mellitus with diabetic peripheral angiopathy without gangrene: Secondary | ICD-10-CM | POA: Diagnosis not present

## 2019-02-19 DIAGNOSIS — I5022 Chronic systolic (congestive) heart failure: Secondary | ICD-10-CM | POA: Diagnosis not present

## 2019-02-19 DIAGNOSIS — I129 Hypertensive chronic kidney disease with stage 1 through stage 4 chronic kidney disease, or unspecified chronic kidney disease: Secondary | ICD-10-CM | POA: Diagnosis not present

## 2019-02-19 DIAGNOSIS — E1142 Type 2 diabetes mellitus with diabetic polyneuropathy: Secondary | ICD-10-CM | POA: Diagnosis not present

## 2019-02-19 DIAGNOSIS — E039 Hypothyroidism, unspecified: Secondary | ICD-10-CM | POA: Diagnosis not present

## 2019-02-19 DIAGNOSIS — I25118 Atherosclerotic heart disease of native coronary artery with other forms of angina pectoris: Secondary | ICD-10-CM | POA: Diagnosis not present

## 2019-03-09 ENCOUNTER — Telehealth: Payer: Self-pay

## 2019-03-09 NOTE — Telephone Encounter (Signed)
1. Do you currently have a fever? No 2. Have you recently travelled on a cruise, internationally, or to [place], [place], [place], [place], [place], or [place], [place] ([employer]) ? No 3. Have you been in contact with someone that is currently pending confirmation of Covid19 testing or has been confirmed to have the Covid19 virus?  No 4. Are you currently experiencing fatigue or cough? No  Spoke w/ pt's wife. Advised that we are restricting visitors at this time and anyone present in the vehicle should meet the above criteria as well. Advised that visit will be at curbside for finger stick ONLY and will receive call with instructions. Pt also advised to please bring own pen for signature of arrival document.   

## 2019-03-12 ENCOUNTER — Other Ambulatory Visit: Payer: Self-pay

## 2019-03-12 ENCOUNTER — Ambulatory Visit (INDEPENDENT_AMBULATORY_CARE_PROVIDER_SITE_OTHER): Payer: HMO

## 2019-03-12 DIAGNOSIS — Z7901 Long term (current) use of anticoagulants: Secondary | ICD-10-CM | POA: Diagnosis not present

## 2019-03-12 DIAGNOSIS — I4819 Other persistent atrial fibrillation: Secondary | ICD-10-CM

## 2019-03-12 DIAGNOSIS — Z95810 Presence of automatic (implantable) cardiac defibrillator: Secondary | ICD-10-CM | POA: Diagnosis not present

## 2019-03-12 DIAGNOSIS — I5022 Chronic systolic (congestive) heart failure: Secondary | ICD-10-CM

## 2019-03-12 DIAGNOSIS — Z5181 Encounter for therapeutic drug level monitoring: Secondary | ICD-10-CM | POA: Diagnosis not present

## 2019-03-12 DIAGNOSIS — I480 Paroxysmal atrial fibrillation: Secondary | ICD-10-CM | POA: Diagnosis not present

## 2019-03-12 LAB — POCT INR: INR: 2.1 (ref 2.0–3.0)

## 2019-03-12 NOTE — Patient Instructions (Signed)
Please continue dosage 1 TABLET EVERY DAY.    Recheck in 6 weeks in [place].

## 2019-03-13 NOTE — Progress Notes (Signed)
EPIC Encounter for ICM Monitoring  Patient Name: [name] is a 85 y.o. male Date: 03/13/2019 Primary Care Physican: [name], MD Primary [place] Electrophysiologist: Allred BiV Pacing:97% 4/1/2020Weight:187 lbs  Clinical Status (05-Feb-2019 to 12-Mar-2019)  Treated VT/VF 0 episodes  AT/AF 1 episode   Time in AT/AF 0.2 hr/day (0.8%)   Longest AT/AF 7 hours  Observations (1) (05-Feb-2019 to 12-Mar-2019)  Patient Activity less than 1 hr/day for 5 weeks..     Transmission reviewed.        Optivol Thoracic impedancenormal.  Prescribed:Furosemide40 mg take1 tablet daily.  Labs: 01/13/2019 Creatinine1.11Doneta Public, Potassium4.2, Sodium138, GFR>60 01/12/2019 Creatinine1.35, Doneta Public, Potassium3.6, [id number], [id number]  01/11/2019 Creatinine1.17, BUN19, Potassium3.8, Sodium135, GFR57->60  01/10/2019 Creatinine1.08, BUN21, Potassium3.9, [id number], GFR>60  01/09/2019 Creatinine1.22, BUN23, Potassium4.0, [id number], GFR54>60  01/08/2019 Creatinine1.44, BUN24, Potassium4.2, [id number], [id number]  Acomplete set of results can be found in Results Review.  Recommendations: None  Follow-up plan: ICM clinic phone appointment on6/22/2020.  Copy of ICM check sent to Dr.Allred.   3 month ICM trend: 03/12/2019    1 Year ICM trend:             Rosalene Billings, RN 03/13/2019 3:48 PM

## 2019-03-15 ENCOUNTER — Telehealth: Payer: Self-pay | Admitting: Interventional Cardiology

## 2019-03-15 NOTE — Telephone Encounter (Addendum)
Called and spoke to patient's wife to discuss what medicines the patient is taking. Patient has been taking 3.125 mg of carvedilol BID, amio 200 mg 1/2 tablet QD and lasix 40 mg QD. Patient asymptomatic. BP 118/72 not HR available. Will discuss further with PharmD at [facility] tomorrow

## 2019-03-15 NOTE — Telephone Encounter (Signed)
[name], LPN @ [facility] needs to go over [name]'s medication list with the nurse because the patient is taking his medications differently than what is listed.

## 2019-03-15 NOTE — Telephone Encounter (Signed)
This is a Dr.Varanasi pt . Only see [facility] [place] for CVRR.

## 2019-03-20 DIAGNOSIS — E109 Type 1 diabetes mellitus without complications: Secondary | ICD-10-CM | POA: Diagnosis not present

## 2019-03-20 DIAGNOSIS — E1122 Type 2 diabetes mellitus with diabetic chronic kidney disease: Secondary | ICD-10-CM | POA: Diagnosis not present

## 2019-03-20 DIAGNOSIS — I129 Hypertensive chronic kidney disease with stage 1 through stage 4 chronic kidney disease, or unspecified chronic kidney disease: Secondary | ICD-10-CM | POA: Diagnosis not present

## 2019-03-20 DIAGNOSIS — N400 Enlarged prostate without lower urinary tract symptoms: Secondary | ICD-10-CM | POA: Diagnosis not present

## 2019-03-20 DIAGNOSIS — E039 Hypothyroidism, unspecified: Secondary | ICD-10-CM | POA: Diagnosis not present

## 2019-03-20 DIAGNOSIS — E1142 Type 2 diabetes mellitus with diabetic polyneuropathy: Secondary | ICD-10-CM | POA: Diagnosis not present

## 2019-03-20 DIAGNOSIS — N183 Chronic kidney disease, stage 3 (moderate): Secondary | ICD-10-CM | POA: Diagnosis not present

## 2019-03-20 DIAGNOSIS — I25118 Atherosclerotic heart disease of native coronary artery with other forms of angina pectoris: Secondary | ICD-10-CM | POA: Diagnosis not present

## 2019-03-20 DIAGNOSIS — J449 Chronic obstructive pulmonary disease, unspecified: Secondary | ICD-10-CM | POA: Diagnosis not present

## 2019-03-20 DIAGNOSIS — E1151 Type 2 diabetes mellitus with diabetic peripheral angiopathy without gangrene: Secondary | ICD-10-CM | POA: Diagnosis not present

## 2019-03-20 DIAGNOSIS — I5023 Acute on chronic systolic (congestive) heart failure: Secondary | ICD-10-CM | POA: Diagnosis not present

## 2019-03-20 DIAGNOSIS — I5022 Chronic systolic (congestive) heart failure: Secondary | ICD-10-CM | POA: Diagnosis not present

## 2019-03-20 MED ORDER — AMIODARONE HCL 200 MG PO TABS: 100.0000 mg | ORAL_TABLET | Freq: Every day | ORAL | 3 refills | Status: DC

## 2019-03-20 NOTE — Telephone Encounter (Signed)
OK to continue current dose.

## 2019-03-20 NOTE — Telephone Encounter (Signed)
Lyerly, LPN at [facility] who states that the patient has only been taking amio 200 mg 1/2 tablet QD, which is different from the med list. Patient is completely asymptomatic. BP 118/72, no HR available. [name] from [facility] wanting to know whether or not to have patient continue current dose of amio since he is feeling fine.

## 2019-03-20 NOTE — Telephone Encounter (Signed)
Called and made patient's wife (DPR on file) aware that [name] was okay with him continuing to take amio 200 mg 1/2 tablet QD.

## 2019-03-21 ENCOUNTER — Other Ambulatory Visit: Payer: Self-pay | Admitting: Interventional Cardiology

## 2019-03-26 ENCOUNTER — Other Ambulatory Visit: Payer: Self-pay

## 2019-03-26 DIAGNOSIS — J449 Chronic obstructive pulmonary disease, unspecified: Secondary | ICD-10-CM | POA: Diagnosis not present

## 2019-03-26 DIAGNOSIS — G4733 Obstructive sleep apnea (adult) (pediatric): Secondary | ICD-10-CM | POA: Diagnosis not present

## 2019-03-26 NOTE — Patient Outreach (Signed)
  [place] [facility]) Care Management Chronic Special Needs Program    03/26/2019  Name: TRAVANTI MCMANUS, DOB: 1934-04-15  MRN: [id number]   Mr. Averie Meiner is enrolled in a chronic special needs plan for Heart Failure. Client is actively engaged with [facility] for complex community care management. Per [facility] notation: engaged 02/09/2019. HgA1C was 6.7 on 01/02/2019. Client's wife principle caregiver.  Telephonic assessment on 02/09/2019-Per [facility] Notes: Atrial fibrillation-stable medications amiodarone, warfarin, coreg; CHF- In and outs with daily weights, fluid salt restrictions of 1.5liters and 2 grams, medications furosemide, potassium, follow upw the pcp/cardiology; COPD-stable medications albuterol nebulizer, Trilogy, follow up with pcp; HTH: stable medications furosemide, coreg, monitoring; DM stable-blood sugar range 100-130 per apatient, low carbohydrate diets along with daily checks, scheduled for HgA1C evaluation at next scheduled follow up with PCP. Covid-19 discussed/education with [facility]. Per notes- no complaints voiced during telephonic assessment.  Plan: RNCM will continue to follow as client's assigned C-SNP Care Management Coordinator; continue care coordination with [facility]. [facility]'s next scheduled visit is 04/12/2019. Plan follow up in 3 months.  Thea Silversmith, RN, MSN, [place] [phone]

## 2019-04-02 ENCOUNTER — Ambulatory Visit: Payer: PPO | Admitting: Interventional Cardiology

## 2019-04-02 DIAGNOSIS — Z0000 Encounter for general adult medical examination without abnormal findings: Secondary | ICD-10-CM | POA: Diagnosis not present

## 2019-04-02 DIAGNOSIS — Z1389 Encounter for screening for other disorder: Secondary | ICD-10-CM | POA: Diagnosis not present

## 2019-04-12 ENCOUNTER — Telehealth: Payer: Self-pay | Admitting: *Deleted

## 2019-04-12 ENCOUNTER — Ambulatory Visit (INDEPENDENT_AMBULATORY_CARE_PROVIDER_SITE_OTHER): Payer: HMO | Admitting: *Deleted

## 2019-04-12 DIAGNOSIS — I255 Ischemic cardiomyopathy: Secondary | ICD-10-CM

## 2019-04-12 DIAGNOSIS — I5022 Chronic systolic (congestive) heart failure: Secondary | ICD-10-CM

## 2019-04-12 LAB — CUP PACEART REMOTE DEVICE CHECK
Battery Remaining Longevity: 7 mo
Battery Voltage: 2.83 V
Brady Statistic AP VP Percent: 6.26 %
Brady Statistic AP VS Percent: 5.74 %
Brady Statistic AS VP Percent: 29.68 %
Brady Statistic AS VS Percent: 58.31 %
Brady Statistic RA Percent Paced: 11.26 %
Brady Statistic RV Percent Paced: 36.48 %
Date Time Interrogation Session: 20200618073422
HighPow Impedance: 49 Ohm
HighPow Impedance: 58 Ohm
Implantable Lead Implant Date: 20091215
Implantable Lead Implant Date: 20091215
Implantable Lead Implant Date: 20091215
Implantable Lead Location: 753858
Implantable Lead Location: 753859
Implantable Lead Location: 753860
Implantable Lead Model: 4196
Implantable Lead Model: 5076
Implantable Lead Model: 6947
Implantable Pulse Generator Implant Date: 20150113
Lead Channel Impedance Value: 4047 Ohm
Lead Channel Impedance Value: 418 Ohm
Lead Channel Impedance Value: 456 Ohm
Lead Channel Impedance Value: 475 Ohm
Lead Channel Impedance Value: 475 Ohm
Lead Channel Impedance Value: 836 Ohm
Lead Channel Pacing Threshold Amplitude: 0.875 V
Lead Channel Pacing Threshold Amplitude: 1 V
Lead Channel Pacing Threshold Amplitude: 1.75 V
Lead Channel Pacing Threshold Pulse Width: 0.4 ms
Lead Channel Pacing Threshold Pulse Width: 0.4 ms
Lead Channel Pacing Threshold Pulse Width: 0.6 ms
Lead Channel Sensing Intrinsic Amplitude: 0.75 mV
Lead Channel Sensing Intrinsic Amplitude: 13.875 mV
Lead Channel Setting Pacing Amplitude: 1.5 V
Lead Channel Setting Pacing Amplitude: 2 V
Lead Channel Setting Pacing Amplitude: 2.5 V
Lead Channel Setting Pacing Pulse Width: 0.6 ms
Lead Channel Setting Pacing Pulse Width: 1 ms
Lead Channel Setting Sensing Sensitivity: 0.3 mV

## 2019-04-12 NOTE — Telephone Encounter (Signed)
Spoke with patient's wife due to alert received for low BiV pacing (36.5%) and increased AF burden (99.5% burden--likely persistent) since last transmission on 03/12/19. She reports pt is compliant with medications, including warfarin and amiodarone. She believes pt has had more shortness of breath for the past month or so. Thoracic impedance at baseline today.  Estimated remaining battery longevity 7 months. Discussed with pt's wife, she is aware of auditory alert tone for ERI. Will also schedule for monthly battery checks.  Reviewed with Dr. Rayann Heman, who recommended f/u with AF Clinic. Pt's wife is agreeable to this plan and denies additional concerns at this time. Pt has a f/u with Dr. Laurann Montana tomorrow, 6/19, at 1:30pm and will likely be out of the house from 1-3pm.

## 2019-04-13 ENCOUNTER — Other Ambulatory Visit: Payer: Self-pay

## 2019-04-13 ENCOUNTER — Ambulatory Visit (HOSPITAL_COMMUNITY)
Admission: RE | Admit: 2019-04-13 | Discharge: 2019-04-13 | Disposition: A | Discharge status: Home / Self Care | Payer: HMO | Source: Ambulatory Visit | Attending: Nurse Practitioner | Admitting: Nurse Practitioner

## 2019-04-13 DIAGNOSIS — I480 Paroxysmal atrial fibrillation: Secondary | ICD-10-CM | POA: Diagnosis not present

## 2019-04-13 DIAGNOSIS — J449 Chronic obstructive pulmonary disease, unspecified: Secondary | ICD-10-CM | POA: Diagnosis not present

## 2019-04-13 DIAGNOSIS — E7800 Pure hypercholesterolemia, unspecified: Secondary | ICD-10-CM | POA: Diagnosis not present

## 2019-04-13 DIAGNOSIS — I4819 Other persistent atrial fibrillation: Secondary | ICD-10-CM

## 2019-04-13 DIAGNOSIS — I5022 Chronic systolic (congestive) heart failure: Secondary | ICD-10-CM | POA: Diagnosis not present

## 2019-04-13 DIAGNOSIS — E1122 Type 2 diabetes mellitus with diabetic chronic kidney disease: Secondary | ICD-10-CM | POA: Diagnosis not present

## 2019-04-13 DIAGNOSIS — I10 Essential (primary) hypertension: Secondary | ICD-10-CM | POA: Diagnosis not present

## 2019-04-13 DIAGNOSIS — E109 Type 1 diabetes mellitus without complications: Secondary | ICD-10-CM | POA: Diagnosis not present

## 2019-04-13 DIAGNOSIS — E039 Hypothyroidism, unspecified: Secondary | ICD-10-CM | POA: Diagnosis not present

## 2019-04-13 MED ORDER — AMIODARONE HCL 200 MG PO TABS: 200.0000 mg | ORAL_TABLET | Freq: Two times a day (BID) | ORAL | 3 refills | Status: DC

## 2019-04-13 NOTE — Telephone Encounter (Signed)
appt made

## 2019-04-13 NOTE — Progress Notes (Signed)
Electrophysiology TeleHealth Note   Due to national recommendations of social distancing due to [place] 19, Audio/video telehealth visit is felt to be most appropriate for this patient at this time.  See MyChart message/consent below from today for patient consent regarding telehealth for the Atrial Fibrillation Clinic.    Date:  04/13/2019   ID:  [name], DOB 09-23-1934, MRN [id number]  Location: home  Provider location: [address] [place], [place] [address] Evaluation Performed: Follow up   PCP:  [name], MD  Primary Cardiologist: [name] Primary Electrophysiologist: Dr. Rayann Heman  CC: Persistent afib since 5/18 per device clinic   History of Present Illness: [name] is a 85 y.o. male who presents via  video conferencing for a telehealth visit today.    The device clinic per Dr. Rayann Heman asked for afib clinic to f/u with pt as he has been in persistent afib since 5/18 when his biv pacer was interrogated. The pt states that he is not aware. His weight is stable, LLE stable, no unusual dyspnea. He has felt lightheaded once or twice. He is on amiodarone and warfarin. .    Today, he denies symptoms of palpitations, chest pain, shortness of breath, orthopnea, PND, lower extremity edema, claudication,  Rare dizziness, presyncope, syncope, bleeding, or neurologic sequela. The patient is tolerating medications without difficulties and is otherwise without complaint today.   he denies symptoms of cough, fevers, chills, or new SOB worrisome for COVID 19.      he has a BMI of There is no height or weight on file to calculate BMI.. There were no vitals filed for this visit.  Past Medical History:  Diagnosis Date  . Atrial fibrillation ([place])    persistent, previously seen at [facility] and placed on amiodarone  . Benign prostatic hypertrophy   . CAD (coronary artery disease)    multivessel s/p inferolateral wall MI with subsequent CABG 11/1998.  Cath 2009 with  Patent grafts  . Cleft palate   . COPD with emphysema ([place]) 04/01/2010  . DM (diabetes mellitus), type 2 ([place])   . Dyspnea   . GERD (gastroesophageal reflux disease)   . HTN (hypertension)   . Hyperlipidemia   . Hypothyroidism   . Iron deficiency anemia   . Ischemic dilated cardiomyopathy ([place])    EF 35-40% by MUGA 6/11  . Myocardial infarction ([place])   . Nasal septal deviation   . Nephrolithiasis   . OSA (obstructive sleep apnea)   . PAF (paroxysmal atrial fibrillation) ([place])   . Peripheral arterial disease ([facility])    left subclavian artery stenosis  . PNA (pneumonia)   . Psoriasis   . Seborrheic keratosis   . Stroke ([place])   . Systolic congestive heart failure ([place]) 2009   s/p BiV ICD implantation by Dr Leonia Reeves (MDT)   Past Surgical History:  Procedure Laterality Date  . BI-VENTRICULAR IMPLANTABLE CARDIOVERTER DEFIBRILLATOR  (CRT-D)  10-08-08; 11-06-2013   Dr Leonia Reeves (MDT) implant for primary prevention; gen change to MDT VivaXT CRTD by Dr Rayann Heman  . BIV ICD GENERTAOR CHANGE OUT N/A 11/06/2013   Procedure: BIV ICD GENERTAOR CHANGE OUT;  Surgeon: Coralyn Mark, MD;  Location: [facility] CATH LAB;  Service: Cardiovascular;  Laterality: N/A;  . c-spine surgery    . CARDIOVERSION N/A 04/29/2016   Procedure: CARDIOVERSION;  Surgeon: Pixie Casino, MD;  Location: [facility] ENDOSCOPY;  Service: Cardiovascular;  Laterality: N/A;  . CARPAL TUNNEL RELEASE    . CATARACT EXTRACTION    .  CORONARY ARTERY BYPASS GRAFT     LIMA to LAD, SVG to OM, SVG to diagonal  . ELECTROPHYSIOLOGIC STUDY N/A 06/11/2016   Procedure: Atrial Fibrillation Ablation;  Surgeon: Thompson Grayer, MD;  Location: [facility] CV LAB;  Service: Cardiovascular;  Laterality: N/A;  . left cleft palate and left cleft lip repair       Current Outpatient Medications  Medication Sig Dispense Refill  . acetaminophen (TYLENOL) 500 MG tablet Take 1,000 mg by mouth at bedtime as needed for mild pain or moderate pain.    [name] albuterol (PROVENTIL)  (2.5 MG/3ML) 0.083% nebulizer solution USE 1 VIAL VIA NEBULIZER 3 TIMES A DAY AS DIRECTED. DX:J44.9 (Patient taking differently: Take 2.5 mg by nebulization 3 (three) times daily. ) 300 mL 3  . amiodarone (PACERONE) 200 MG tablet Take 1 tablet (200 mg total) by mouth 2 (two) times daily. 45 tablet 3  . atorvastatin (LIPITOR) 80 MG tablet TAKE 1 TABLET (80 MG TOTAL) BY MOUTH DAILY. (Patient taking differently: Take 80 mg by mouth every morning. ) 90 tablet 3  . carvedilol (COREG) 6.25 MG tablet Take 0.5 tablets (3.125 mg total) by mouth 2 (two) times daily. Take if systolic blood pressure >254 (Patient taking differently: Take 3.125 mg by mouth See admin instructions. Take if systolic blood pressure >270 take 3.125 mg and if below he does not take any thing) 180 tablet 3  . cholecalciferol (VITAMIN D) 1000 units tablet Take 2,000 Units by mouth every evening.    [name] dextromethorphan-guaiFENesin (MUCINEX DM) 30-600 MG 12hr tablet Take 1 tablet by mouth 2 (two) times daily as needed for cough.    . feeding supplement, ENSURE ENLIVE, (ENSURE ENLIVE) LIQD Take 237 mLs by mouth 2 (two) times daily between meals. 60 Bottle 0  . ferrous sulfate 325 (65 FE) MG tablet Take 325 mg by mouth every evening.     . fluticasone (FLONASE) 50 MCG/ACT nasal spray SPRAY 2 SPRAYS INTO EACH NOSTRIL EVERY DAY (Patient taking differently: Place 2 sprays into both nostrils every morning. ) 48 g 3  . furosemide (LASIX) 40 MG tablet Take 1 tablet (40 mg total) by mouth daily. 120 tablet 1  . Insulin Degludec-Liraglutide (XULTOPHY) 100-3.6 UNIT-MG/ML SOPN Inject 16 Units into the skin every morning.    [name] levothyroxine (SYNTHROID, LEVOTHROID) 100 MCG tablet Take 100 mcg by mouth daily before breakfast.     . Multiple Vitamins-Minerals (PRESERVISION AREDS PO) Take 1 tablet by mouth 2 (two) times daily.    . potassium chloride 20 MEQ TBCR Take 20 mEq by mouth daily. 30 tablet 0  . PRESCRIPTION MEDICATION Inhale into the lungs at  bedtime. CPAP    . senna (SENOKOT) 8.6 MG tablet Take 1 tablet by mouth 2 (two) times daily.     . traMADol (ULTRAM) 50 MG tablet Take 50 mg by mouth at bedtime as needed for moderate pain or severe pain.   0  . TRELEGY ELLIPTA 100-62.5-25 MCG/INH AEPB INHALE 1 PUFF INTO LUNGS ONCE A DAY (Patient taking differently: Take 1 puff by mouth every morning. ) 60 each 3  . vitamin B-12 (CYANOCOBALAMIN) 1000 MCG tablet Take 1,000 mcg by mouth daily.    [name] warfarin (COUMADIN) 5 MG tablet Take as directed by Coumadin Clinic 35 tablet 2   No current facility-administered medications for this encounter.     Allergies:   Aldactone [spironolactone]   Social History:  The patient  reports that he quit smoking about 20 years ago. His  smoking use included cigarettes. He has a 195.00 pack-year smoking history. He has never used smokeless tobacco. He reports that he does not drink alcohol or use drugs.   Family History:  The patient's  family history includes Asthma in his father; Heart attack in his brother; Heart disease in his brother; Hypertension in his brother, father, mother, and sister; Stroke in his father.    ROS:  Please see the history of present illness.   All other systems are personally reviewed and negative.   Exam: Well appearing, alert and conversant, regular work of breathing,  good skin color, very HOH  Recent Labs: 07/13/2018: TSH 0.454 09/20/2018: Magnesium 2.1 01/08/2019: ALT 16 01/09/2019: B Natriuretic Peptide 569.8 01/10/2019: Hemoglobin 11.0; Platelets 218 01/13/2019: BUN 22; Creatinine, Ser 1.11; Potassium 4.2; Sodium 138  personally reviewed    Other studies personally reviewed: Epic records reviewed    ASSESSMENT AND PLAN:  1. Persistent afib x one month atrial fibrillation minimally symptomatic but has HF and potential to deteriorate.  He is currently only being paced at 36 % by BIV Will increase amiodarone to 200 mg bid  No  other changes He will continue to check  his weights and BP/HR daily This am wt stable at 189 lbs, BP 116/78, HR 88  This patients CHA2DS2-VASc Score and unadjusted Ischemic Stroke Rate (% per year) is equal to 7.2 % stroke rate/year from a score of 5   COVID screen The patient does not have any symptoms that suggest any further testing/ screening at this time.  Social distancing reinforced today.    Follow-up:   I will see back next Friday with interogation  Current medicines are reviewed at length with the patient today.   The patient does not have concerns regarding his medicines.  The following changes were made today: Increase amiodarone to 200 mg bid  Labs/ tests ordered today include: none No orders of the defined types were placed in this encounter.   Patient Risk:  after full review of this patients clinical status, I feel that they are at high risk at this time.   Today, I have spent 15 minutes with the patient with telehealth technology discussing above plan.  Eduard Roux NP 04/13/2019 12:39 PM  Afib [facility] [address] [place], [place] [address] [phone]   I hereby voluntarily request, consent and authorize the [facility] and its employed or contracted physicians, physician assistants, nurse practitioners or other licensed health care professionals (the Practitioner), to provide me with telemedicine health care services (the "Services") as deemed necessary by the treating Practitioner. I acknowledge and consent to receive the Services by the Practitioner via telemedicine. I understand that the telemedicine visit will involve communicating with the Practitioner through live audiovisual communication technology and the disclosure of certain medical information by electronic transmission. I acknowledge that I have been given the opportunity to request an in-person assessment or other available alternative prior to the telemedicine visit and am voluntarily  participating in the telemedicine visit.   I understand that I have the right to withhold or withdraw my consent to the use of telemedicine in the course of my care at any time, without affecting my right to future care or treatment, and that the Practitioner or I may terminate the telemedicine visit at any time. I understand that I have the right to inspect all information obtained and/or recorded in the course of the telemedicine visit and may receive copies of available information  for a reasonable fee.  I understand that some of the potential risks of receiving the Services via telemedicine include:   Delay or interruption in medical evaluation due to technological equipment failure or disruption;  Information transmitted may not be sufficient (e.g. poor resolution of images) to allow for appropriate medical decision making by the Practitioner; and/or  In rare instances, security protocols could fail, causing a breach of personal health information.   Furthermore, I acknowledge that it is my responsibility to provide information about my medical history, conditions and care that is complete and accurate to the best of my ability. I acknowledge that Practitioner's advice, recommendations, and/or decision may be based on factors not within their control, such as incomplete or inaccurate data provided by me or distortions of diagnostic images or specimens that may result from electronic transmissions. I understand that the practice of medicine is not an exact science and that Practitioner makes no warranties or guarantees regarding treatment outcomes. I acknowledge that I will receive a copy of this consent concurrently upon execution via email to the email address I last provided but may also request a printed copy by calling the office of the [facility].  I understand that my insurance will be billed for this visit.   I have read or had this consent read to me.  I understand the  contents of this consent, which adequately explains the benefits and risks of the Services being provided via telemedicine.  I have been provided ample opportunity to ask questions regarding this consent and the Services and have had my questions answered to my satisfaction.  I give my informed consent for the services to be provided through the use of telemedicine in my medical care  By participating in this telemedicine visit I agree to the above.

## 2019-04-16 ENCOUNTER — Ambulatory Visit (INDEPENDENT_AMBULATORY_CARE_PROVIDER_SITE_OTHER): Payer: HMO

## 2019-04-16 DIAGNOSIS — Z95810 Presence of automatic (implantable) cardiac defibrillator: Secondary | ICD-10-CM

## 2019-04-16 DIAGNOSIS — I5022 Chronic systolic (congestive) heart failure: Secondary | ICD-10-CM

## 2019-04-16 NOTE — Progress Notes (Signed)
Remote ICD transmission.   

## 2019-04-18 NOTE — Progress Notes (Signed)
EPIC Encounter for ICM Monitoring  Patient Name: [name] is a 85 y.o. male Date: 04/18/2019 Primary Care Physican: [name], MD Primary [place] Electrophysiologist: Allred BiV Pacing:40.8% 04/18/2019 Weight:187-191 lbs  Estimated remaining battery longevity 7 months.  Clinical Status (12-Apr-2019 to 16-Apr-2019)  AT/AF  9 episodes  Time in AT/AF  24.0 hr/day (99.8%)  Longest AT/AF  3 days  Observations (2) (12-Apr-2019 to 16-Apr-2019)  AT/AF >= 6 hr for 4 days.  V. Pacing less than 90%     Spoke with wife.  She stated patient does get [name] of breath and some dizziness.  Afib and low BiV Pacing has been addressed.     Optivol Thoracic impedancenormal.  Prescribed:Furosemide40 mg take1 tablet daily.  Labs: 01/13/2019 Creatinine1.11Doneta Public, Potassium4.2, Sodium138, GFR>60 01/12/2019 Creatinine1.35, Doneta Public, Potassium3.6, [id number], [id number]  01/11/2019 Creatinine1.17, BUN19, Potassium3.8, Sodium135, GFR57->60  01/10/2019 Creatinine1.08, BUN21, Potassium3.9, [id number], GFR>60  01/09/2019 Creatinine1.22, BUN23, Potassium4.0, [id number], GFR54>60  01/08/2019 Creatinine1.44, BUN24, Potassium4.2, [id number], [id number]  Acomplete set of results can be found in Results Review.  Recommendations:Encouraged to call if experiencing any fluid symptoms.  Follow-up plan: ICM clinic phone appointment on7/27/2020.  Copy of ICM check sent to Dr.Allred.   AT/AF    3 month ICM trend: 04/16/2019    1 Year ICM trend:       Rosalene Billings, RN 04/18/2019 9:45 AM

## 2019-04-20 ENCOUNTER — Other Ambulatory Visit: Payer: Self-pay

## 2019-04-20 ENCOUNTER — Encounter (HOSPITAL_COMMUNITY): Payer: Self-pay | Admitting: Nurse Practitioner

## 2019-04-20 ENCOUNTER — Ambulatory Visit (HOSPITAL_COMMUNITY)
Admission: RE | Admit: 2019-04-20 | Discharge: 2019-04-20 | Disposition: A | Discharge status: Home / Self Care | Payer: HMO | Source: Ambulatory Visit | Attending: Nurse Practitioner | Admitting: Nurse Practitioner

## 2019-04-20 VITALS — BP 126/74 | HR 96 | Ht 66.0 in | Wt 193.6 lb

## 2019-04-20 DIAGNOSIS — E039 Hypothyroidism, unspecified: Secondary | ICD-10-CM | POA: Insufficient documentation

## 2019-04-20 DIAGNOSIS — D509 Iron deficiency anemia, unspecified: Secondary | ICD-10-CM | POA: Insufficient documentation

## 2019-04-20 DIAGNOSIS — E119 Type 2 diabetes mellitus without complications: Secondary | ICD-10-CM | POA: Diagnosis not present

## 2019-04-20 DIAGNOSIS — I255 Ischemic cardiomyopathy: Secondary | ICD-10-CM | POA: Insufficient documentation

## 2019-04-20 DIAGNOSIS — I10 Essential (primary) hypertension: Secondary | ICD-10-CM | POA: Insufficient documentation

## 2019-04-20 DIAGNOSIS — Z8673 Personal history of transient ischemic attack (TIA), and cerebral infarction without residual deficits: Secondary | ICD-10-CM | POA: Insufficient documentation

## 2019-04-20 DIAGNOSIS — I252 Old myocardial infarction: Secondary | ICD-10-CM | POA: Insufficient documentation

## 2019-04-20 DIAGNOSIS — E785 Hyperlipidemia, unspecified: Secondary | ICD-10-CM | POA: Insufficient documentation

## 2019-04-20 DIAGNOSIS — Z79899 Other long term (current) drug therapy: Secondary | ICD-10-CM | POA: Insufficient documentation

## 2019-04-20 DIAGNOSIS — I4819 Other persistent atrial fibrillation: Secondary | ICD-10-CM | POA: Diagnosis not present

## 2019-04-20 DIAGNOSIS — Z7901 Long term (current) use of anticoagulants: Secondary | ICD-10-CM | POA: Diagnosis not present

## 2019-04-20 DIAGNOSIS — I739 Peripheral vascular disease, unspecified: Secondary | ICD-10-CM | POA: Insufficient documentation

## 2019-04-20 DIAGNOSIS — Z87891 Personal history of nicotine dependence: Secondary | ICD-10-CM | POA: Diagnosis not present

## 2019-04-20 DIAGNOSIS — Z951 Presence of aortocoronary bypass graft: Secondary | ICD-10-CM | POA: Insufficient documentation

## 2019-04-20 DIAGNOSIS — K219 Gastro-esophageal reflux disease without esophagitis: Secondary | ICD-10-CM | POA: Diagnosis not present

## 2019-04-20 DIAGNOSIS — I420 Dilated cardiomyopathy: Secondary | ICD-10-CM | POA: Diagnosis not present

## 2019-04-20 DIAGNOSIS — I2510 Atherosclerotic heart disease of native coronary artery without angina pectoris: Secondary | ICD-10-CM | POA: Insufficient documentation

## 2019-04-20 DIAGNOSIS — Z8249 Family history of ischemic heart disease and other diseases of the circulatory system: Secondary | ICD-10-CM | POA: Diagnosis not present

## 2019-04-20 NOTE — Progress Notes (Signed)
Electrophysiology TeleHealth Note   Due to national recommendations of social distancing due to [place] 19, Audio/video telehealth visit is felt to be most appropriate for this patient at this time.  See MyChart message/consent below from today for patient consent regarding telehealth for the At[facility] Fibrillation Clinic.    Date:  04/20/2019   ID:  [name], DOB [birth year], MRN [id number]  Location: home  Provider location: [address] [place], [place] [address] Evaluation Performed: Follow up   PCP:  [name], MD  Primary Cardiologist: [name] Primary Electrophysiologist: Dr. Rayann Heman  CC: Persistent afib since 5/18 per device clinic   History of Present Illness: [name] is a 85 y.o. male who presents via  video conferencing for a telehealth visit today.    The device clinic per Dr. Rayann Heman asked for afib clinic to f/u with pt as he has been in persistent afib since 5/18 when his biv pacer was interrogated. The pt states that he is not aware. His weight is stable, LLE stable, no unusual dyspnea. He has felt lightheaded once or twice. He is on amiodarone and warfarin. .    Today, he denies symptoms of palpitations, chest pain, shortness of breath, orthopnea, PND, lower extremity edema, claudication,  Rare dizziness, presyncope, syncope, bleeding, or neurologic sequela. The patient is tolerating medications without difficulties and is otherwise without complaint today.   he denies symptoms of cough, fevers, chills, or new SOB worrisome for COVID 19.      he has a BMI of There is no height or weight on file to calculate BMI.. There were no vitals filed for this visit.  Past Medical History:  Diagnosis Date  . Atrial fibrillation ([place])    persistent, previously seen at [facility] and placed on amiodarone  . Benign prostatic hypertrophy   . CAD (coronary artery disease)    multivessel s/p inferolateral wall MI with subsequent CABG 11/1998.  Cath 2009 with  Patent grafts  . Cleft palate   . COPD with emphysema ([place]) 04/01/2010  . DM (diabetes mellitus), type 2 ([place])   . Dyspnea   . GERD (gastroesophageal reflux disease)   . HTN (hypertension)   . Hyperlipidemia   . Hypothyroidism   . Iron deficiency anemia   . Ischemic dilated cardiomyopathy ([place])    EF 35-40% by MUGA 6/11  . Myocardial infarction ([place])   . Nasal septal deviation   . Nephrolithiasis   . OSA (obstructive sleep apnea)   . PAF (paroxysmal atrial fibrillation) ([place])   . Peripheral arterial disease (H[facility])    left subclavian artery stenosis  . PNA (pneumonia)   . Psoriasis   . Seborrheic keratosis   . Stroke ([place])   . Systolic congestive heart failure ([place]) 2009   s/p BiV ICD implantation by Dr Leonia Reeves (MDT)   Past Surgical History:  Procedure Laterality Date  . BI-VENTRICULAR IMPLANTABLE CARDIOVERTER DEFIBRILLATOR  (CRT-D)  10-08-08; 11-06-2013   Dr Leonia Reeves (MDT) implant for primary prevention; gen change to MDT VivaXT CRTD by Dr Rayann Heman  . BIV ICD GENERTAOR CHANGE OUT N/A 11/06/2013   Procedure: BIV ICD GENERTAOR CHANGE OUT;  Surgeon: Coralyn Mark, MD;  Location[facility] C[facility] LAB;  Service: Cardiovascular;  Laterality: N/A;  . c-spine surgery    . CARDIOVERSION N/A 04/29/2016   Procedure: CARDIOVERSION;  Surgeon: Pixie Casino, MD;  Location: [facility] ENDOSCOPY;  Service: Cardiovascular;  Laterality: N/A;  . CARPAL TUNNEL RELEASE    . CATARACT EXTRACTION    .  CORONARY ARTERY BYPASS GRAFT     LIMA to LAD, SVG to OM, SVG to diagonal  . ELECTROPHYSIOLOGIC STUDY N/A 06/11/2016   Procedure: Atrial Fibrillation Ablation;  Surgeon: Thompson Grayer, MD;  Location: [place] CV LAB;  Service: Cardiovascular;  Laterality: N/A;  . left cleft palate and left cleft lip repair       Current Outpatient Medications  Medication Sig Dispense Refill  . acetaminophen (TYLENOL) 500 MG tablet Take 1,000 mg by mouth at bedtime as needed for mild pain or moderate pain.    [name] albuterol (PROVENTIL)  (2.5 MG/3ML) 0.083% nebulizer solution USE 1 VIAL VIA NEBULIZER 3 TIMES A DAY AS DIRECTED. DX:J44.9 (Patient taking differently: Take 2.5 mg by nebulization 3 (three) times daily. ) 300 mL 3  . amiodarone (PACERONE) 200 MG tablet Take 100 mg by mouth daily.    [name] atorvastatin (LIPITOR) 80 MG tablet TAKE 1 TABLET (80 MG TOTAL) BY MOUTH DAILY. (Patient taking differently: Take 80 mg by mouth every morning. ) 90 tablet 3  . carvedilol (COREG) 6.25 MG tablet Take 0.5 tablets (3.125 mg total) by mouth 2 (two) times daily. Take if systolic blood pressure >810 (Patient taking differently: Take 3.125 mg by mouth See admin instructions. Take if systolic blood pressure >175 take 3.125 mg and if below he does not take any thing) 180 tablet 3  . cholecalciferol (VITAMIN D) 1000 units tablet Take 2,000 Units by mouth every evening.    [name] dextromethorphan-guaiFENesin (MUCINEX DM) 30-600 MG 12hr tablet Take 1 tablet by mouth 2 (two) times daily as needed for cough.    . feeding supplement, ENSURE ENLIVE, (ENSURE ENLIVE) LIQD Take 237 mLs by mouth 2 (two) times daily between meals. 60 Bottle 0  . ferrous sulfate 325 (65 FE) MG tablet Take 325 mg by mouth every evening.     . fluticasone (FLONASE) 50 MCG/ACT nasal spray SPRAY 2 SPRAYS INTO EACH NOSTRIL EVERY DAY (Patient taking differently: Place 2 sprays into both nostrils every morning. ) 48 g 3  . furosemide (LASIX) 40 MG tablet Take 1 tablet (40 mg total) by mouth daily. 120 tablet 1  . Insulin Degludec-Liraglutide (XULTOPHY) 100-3.6 UNIT-MG/ML SOPN Inject 16 Units into the skin every morning.    [name] levothyroxine (SYNTHROID, LEVOTHROID) 100 MCG tablet Take 100 mcg by mouth daily before breakfast.     . Multiple Vitamins-Minerals (PRESERVISION AREDS PO) Take 1 tablet by mouth 2 (two) times daily.    . potassium chloride 20 MEQ TBCR Take 20 mEq by mouth daily. 30 tablet 0  . PRESCRIPTION MEDICATION Inhale into the lungs at bedtime. CPAP    . senna (SENOKOT) 8.6 MG  tablet Take 1 tablet by mouth 2 (two) times daily.     . traMADol (ULTRAM) 50 MG tablet Take 50 mg by mouth at bedtime as needed for moderate pain or severe pain.   0  . TRELEGY ELLIPTA 100-62.5-25 MCG/INH AEPB INHALE 1 PUFF INTO LUNGS ONCE A DAY (Patient taking differently: Take 1 puff by mouth every morning. ) 60 each 3  . vitamin B-12 (CYANOCOBALAMIN) 1000 MCG tablet Take 1,000 mcg by mouth daily.    [name] warfarin (COUMADIN) 5 MG tablet Take as directed by Coumadin Clinic 35 tablet 2   No current facility-administered medications for this encounter.     Allergies:   Aldactone [spironolactone]   Social History:  The patient  reports that he quit smoking about 20 years ago. His smoking use included cigarettes. He has a  195.00 pack-year smoking history. He has never used smokeless tobacco. He reports that he does not drink alcohol or use drugs.   Family History:  The patient's  family history includes Asthma in his father; Heart attack in his brother; Heart disease in his brother; Hypertension in his brother, father, mother, and sister; Stroke in his father.    ROS:  Please see the history of present illness.   All other systems are personally reviewed and negative.   Exam: Well appearing, alert and conversant, regular work of breathing,  good skin color, very HOH  Recent Labs: 07/13/2018: TSH 0.454 09/20/2018: Magnesium 2.1 01/08/2019: ALT 16 01/09/2019: B Natriuretic Peptide 569.8 01/10/2019: Hemoglobin 11.0; Platelets 218 01/13/2019: BUN 22; Creatinine, Ser 1.11; Potassium 4.2; Sodium 138  personally reviewed    Other studies personally reviewed: Epic records reviewed    ASSESSMENT AND PLAN:  1. Persistent afib x one month atrial fibrillation minimally symptomatic but has HF and potential to deteriorate.  He is currently only being paced at 36 % by BIV Will increase amiodarone to 200 mg bid  No  other changes He will continue to check his weights and BP/HR daily This am wt  stable at 189 lbs, BP 116/78, HR 88  This patients CHA2DS2-VASc Score and unadjusted Ischemic Stroke Rate (% per year) is equal to 7.2 % stroke rate/year from a score of 5   COVID screen The patient does not have any symptoms that suggest any further testing/ screening at this time.  Social distancing reinforced today.    Follow-up:   I will see back next Friday with interogation  Current medicines are reviewed at length with the patient today.   The patient does not have concerns regarding his medicines.  The following changes were made today: Increase amiodarone to 100 mg bid  Labs/ tests ordered today include: none No orders of the defined types were placed in this encounter.   Patient Risk:  after full review of this patients clinical status, I feel that they are at high risk at this time.   Today, I have spent 15 minutes with the patient with telehealth technology discussing above plan.  Eduard Roux NP 04/20/2019 9:20 AM  Afib [facility] [address] [place], [place] [address] [phone]   I hereby voluntarily request, consent and authorize the [facility] and its employed or contracted physicians, physician assistants, nurse practitioners or other licensed health care professionals (the Practitioner), to provide me with telemedicine health care services (the "Services") as deemed necessary by the treating Practitioner. I acknowledge and consent to receive the Services by the Practitioner via telemedicine. I understand that the telemedicine visit will involve communicating with the Practitioner through live audiovisual communication technology and the disclosure of certain medical information by electronic transmission. I acknowledge that I have been given the opportunity to request an in-person assessment or other available alternative prior to the telemedicine visit and am voluntarily participating in the telemedicine visit.   I  understand that I have the right to withhold or withdraw my consent to the use of telemedicine in the course of my care at any time, without affecting my right to future care or treatment, and that the Practitioner or I may terminate the telemedicine visit at any time. I understand that I have the right to inspect all information obtained and/or recorded in the course of the telemedicine visit and may receive copies of available information for a reasonable fee.  I understand  that some of the potential risks of receiving the Services via telemedicine include:   Delay or interruption in medical evaluation due to technological equipment failure or disruption;  Information transmitted may not be sufficient (e.g. poor resolution of images) to allow for appropriate medical decision making by the Practitioner; and/or  In rare instances, security protocols could fail, causing a breach of personal health information.   Furthermore, I acknowledge that it is my responsibility to provide information about my medical history, conditions and care that is complete and accurate to the best of my ability. I acknowledge that Practitioner's advice, recommendations, and/or decision may be based on factors not within their control, such as incomplete or inaccurate data provided by me or distortions of diagnostic images or specimens that may result from electronic transmissions. I understand that the practice of medicine is not an exact science and that Practitioner makes no warranties or guarantees regarding treatment outcomes. I acknowledge that I will receive a copy of this consent concurrently upon execution via email to the email address I last provided but may also request a printed copy by calling the office of the [facility].  I understand that my insurance will be billed for this visit.   I have read or had this consent read to me.  I understand the contents of this consent, which adequately explains  the benefits and risks of the Services being provided via telemedicine.  I have been provided ample opportunity to ask questions regarding this consent and the Services and have had my questions answered to my satisfaction.  I give my informed consent for the services to be provided through the use of telemedicine in my medical care  By participating in this telemedicine visit I agree to the above.

## 2019-04-20 NOTE — Addendum Note (Signed)
Encounter addended by: Sherran Needs, NP on: 04/20/2019 9:21 AM  Actions taken: Clinical Note Signed

## 2019-04-20 NOTE — Patient Instructions (Signed)
Monday return to 1/2 tablet of amiodarone

## 2019-04-20 NOTE — Progress Notes (Signed)
Date:  04/20/2019   ID:  [name], DOB Feb 23, 1934, MRN [id number]  Location: home  Provider location: [address] [place], [place] [address] Evaluation Performed: Follow up   PCP:  [name], MD  Primary Cardiologist: [name] Primary Electrophysiologist: Dr. Rayann Heman  CC: Persistent afib since 5/18 per device clinic   History of Present Illness: [name] is a 85 y.o. male who presents for f/u of device clinic reporting that pt had persistent afib x one month. He was seen via  telehealth visit  And he was unaware. He had controlled v rates. I increased amiodarone to 100 mg bid. He was on warfarin.  F/u in afib clinic, 04/20/19.  He is  a sensed, v paced so appears to be back in rhythm. He feels well. Occasional dizzy spell that appears to be chronic.   Today, he denies symptoms of palpitations, chest pain, shortness of breath, orthopnea, PND, lower extremity edema, claudication,  Rare dizziness, presyncope, syncope, bleeding, or neurologic sequela. The patient is tolerating medications without difficulties and is otherwise without complaint today.   he denies symptoms of cough, fevers, chills, or new SOB worrisome for COVID 19.      he has a BMI of Body mass index is 31.25 kg/m.[name] Filed Weights   04/20/19 0855  Weight: 87.8 kg    Past Medical History:  Diagnosis Date  . Atrial fibrillation ([place])    persistent, previously seen at [facility] and placed on amiodarone  . Benign prostatic hypertrophy   . CAD (coronary artery disease)    multivessel s/p inferolateral wall MI with subsequent CABG 11/1998.  Cath 2009 with Patent grafts  . Cleft palate   . COPD with emphysema ([place]) 04/01/2010  . DM (diabetes mellitus), type 2 ([place])   . Dyspnea   . GERD (gastroesophageal reflux disease)   . HTN (hypertension)   . Hyperlipidemia   . Hypothyroidism   . Iron deficiency anemia   . Ischemic dilated cardiomyopathy ([place])    EF 35-40% by MUGA 6/11  . Myocardial  infarction ([place])   . Nasal septal deviation   . Nephrolithiasis   . OSA (obstructive sleep apnea)   . PAF (paroxysmal atrial fibrillation) ([place])   . Peripheral arterial disease ([facility])    left subclavian artery stenosis  . PNA (pneumonia)   . Psoriasis   . Seborrheic keratosis   . Stroke ([place])   . Systolic congestive heart failure ([place]) 2009   s/p BiV ICD implantation by Dr Leonia Reeves (MDT)   Past Surgical History:  Procedure Laterality Date  . BI-VENTRICULAR IMPLANTABLE CARDIOVERTER DEFIBRILLATOR  (CRT-D)  10-08-08; 11-06-2013   Dr Leonia Reeves (MDT) implant for primary prevention; gen change to MDT VivaXT CRTD by Dr Rayann Heman  . BIV ICD GENERTAOR CHANGE OUT N/A 11/06/2013   Procedure: BIV ICD GENERTAOR CHANGE OUT;  Surgeon: Coralyn Mark, MD;  Location: [facility] CATH LAB;  Service: Cardiovascular;  Laterality: N/A;  . c-spine surgery    . CARDIOVERSION N/A 04/29/2016   Procedure: CARDIOVERSION;  Surgeon: Pixie Casino, MD;  Location: [facility] ENDOSCOPY;  Service: Cardiovascular;  Laterality: N/A;  . CARPAL TUNNEL RELEASE    . CATARACT EXTRACTION    . CORONARY ARTERY BYPASS GRAFT     LIMA to LAD, SVG to OM, SVG to diagonal  . ELECTROPHYSIOLOGIC STUDY N/A 06/11/2016   Procedure: Atrial Fibrillation Ablation;  Surgeon: Thompson Grayer, MD;  Location: [place] [facility] CV LAB;  Service: Cardiovascular;  Laterality:  N/A;  . left cleft palate and left cleft lip repair       Current Outpatient Medications  Medication Sig Dispense Refill  . acetaminophen (TYLENOL) 500 MG tablet Take 1,000 mg by mouth at bedtime as needed for mild pain or moderate pain.    [name] albuterol (PROVENTIL) (2.5 MG/3ML) 0.083% nebulizer solution USE 1 VIAL VIA NEBULIZER 3 TIMES A DAY AS DIRECTED. DX:J44.9 (Patient taking differently: Take 2.5 mg by nebulization 3 (three) times daily. ) 300 mL 3  . amiodarone (PACERONE) 200 MG tablet Take 100 mg by mouth daily.    [name] atorvastatin (LIPITOR) 80 MG tablet TAKE 1 TABLET (80 MG TOTAL) BY MOUTH DAILY.  (Patient taking differently: Take 80 mg by mouth every morning. ) 90 tablet 3  . carvedilol (COREG) 6.25 MG tablet Take 0.5 tablets (3.125 mg total) by mouth 2 (two) times daily. Take if systolic blood pressure >914 (Patient taking differently: Take 3.125 mg by mouth See admin instructions. Take if systolic blood pressure >782 take 3.125 mg and if below he does not take any thing) 180 tablet 3  . cholecalciferol (VITAMIN D) 1000 units tablet Take 2,000 Units by mouth every evening.    . feeding supplement, ENSURE ENLIVE, (ENSURE ENLIVE) LIQD Take 237 mLs by mouth 2 (two) times daily between meals. 60 Bottle 0  . ferrous sulfate 325 (65 FE) MG tablet Take 325 mg by mouth every evening.     . fluticasone (FLONASE) 50 MCG/ACT nasal spray SPRAY 2 SPRAYS INTO EACH NOSTRIL EVERY DAY (Patient taking differently: Place 2 sprays into both nostrils every morning. ) 48 g 3  . furosemide (LASIX) 40 MG tablet Take 1 tablet (40 mg total) by mouth daily. 120 tablet 1  . Insulin Degludec-Liraglutide (XULTOPHY) 100-3.6 UNIT-MG/ML SOPN Inject 16 Units into the skin every morning.    [name] levothyroxine (SYNTHROID, LEVOTHROID) 100 MCG tablet Take 100 mcg by mouth daily before breakfast.     . Multiple Vitamins-Minerals (PRESERVISION AREDS PO) Take 1 tablet by mouth 2 (two) times daily.    . potassium chloride 20 MEQ TBCR Take 20 mEq by mouth daily. 30 tablet 0  . PRESCRIPTION MEDICATION Inhale into the lungs at bedtime. CPAP    . senna (SENOKOT) 8.6 MG tablet Take 1 tablet by mouth 2 (two) times daily.     . traMADol (ULTRAM) 50 MG tablet Take 50 mg by mouth at bedtime as needed for moderate pain or severe pain.   0  . TRELEGY ELLIPTA 100-62.5-25 MCG/INH AEPB INHALE 1 PUFF INTO LUNGS ONCE A DAY (Patient taking differently: Take 1 puff by mouth every morning. ) 60 each 3  . vitamin B-12 (CYANOCOBALAMIN) 1000 MCG tablet Take 1,000 mcg by mouth daily.    [name] warfarin (COUMADIN) 5 MG tablet Take as directed by Coumadin Clinic 35  tablet 2  . dextromethorphan-guaiFENesin (MUCINEX DM) 30-600 MG 12hr tablet Take 1 tablet by mouth 2 (two) times daily as needed for cough.     No current facility-administered medications for this encounter.     Allergies:   Aldactone [spironolactone]   Social History:  The patient  reports that he quit smoking about 20 years ago. His smoking use included cigarettes. He has a 195.00 pack-year smoking history. He has never used smokeless tobacco. He reports that he does not drink alcohol or use drugs.   Family History:  The patient's  family history includes Asthma in his father; Heart attack in his brother; Heart disease in his  brother; Hypertension in his brother, father, mother, and sister; Stroke in his father.    ROS:  Please see the history of present illness.   All other systems are personally reviewed and negative.   Exam: Well appearing, alert and conversant, regular work of breathing,  good skin color, very HOH  Recent Labs: 07/13/2018: TSH 0.454 09/20/2018: Magnesium 2.1 01/08/2019: ALT 16 01/09/2019: B Natriuretic Peptide 569.8 01/10/2019: Hemoglobin 11.0; Platelets 218 01/13/2019: BUN 22; Creatinine, Ser 1.11; Potassium 4.2; Sodium 138  personally reviewed    Other studies personally reviewed: Epic records reviewed    ASSESSMENT AND PLAN:  1. Persistent afib x one month   Minimally symptomatic but has HF and potential to deteriorate. He is now back in SR with increase of amiodarone Will decrease amiodarone back to 100 mg qd starting on Monday No  other changes He will continue to check his weights and BP/HR daily Continue  Warfarin, next INR 6/29  This patients CHA2DS2-VASc Score and unadjusted Ischemic Stroke Rate (% per year) is equal to 7.2 % stroke rate/year from a score of 5  COVID screen The patient does not have any symptoms that suggest any further testing/ screening at this time.  Social distancing reinforced today.    Follow-up:  He has another remote  check July 16 , f/u with Dr. Clayton Bibles as scheduled in September    Labs/ tests ordered today include: none Orders Placed This Encounter  Procedures  . EKG 12-Lead    Patient Risk:  after full review of this patients clinical status, I feel that they are at high risk at this time.   Signed, Roderic Palau NP 04/20/2019 9:21 AM  Afib [facility] [address] [place], [place] [address] [phone]

## 2019-04-23 ENCOUNTER — Ambulatory Visit (INDEPENDENT_AMBULATORY_CARE_PROVIDER_SITE_OTHER): Payer: HMO

## 2019-04-23 ENCOUNTER — Other Ambulatory Visit: Payer: Self-pay

## 2019-04-23 DIAGNOSIS — I4819 Other persistent atrial fibrillation: Secondary | ICD-10-CM

## 2019-04-23 DIAGNOSIS — Z5181 Encounter for therapeutic drug level monitoring: Secondary | ICD-10-CM

## 2019-04-23 DIAGNOSIS — I480 Paroxysmal atrial fibrillation: Secondary | ICD-10-CM | POA: Diagnosis not present

## 2019-04-23 DIAGNOSIS — Z7901 Long term (current) use of anticoagulants: Secondary | ICD-10-CM | POA: Diagnosis not present

## 2019-04-23 LAB — POCT INR: INR: 2.3 (ref 2.0–3.0)

## 2019-04-23 NOTE — Patient Instructions (Signed)
Please continue dosage 1 TABLET EVERY DAY.    Recheck in 6 weeks in [place].

## 2019-04-30 ENCOUNTER — Telehealth: Payer: Self-pay | Admitting: Internal Medicine

## 2019-04-30 NOTE — Telephone Encounter (Signed)
Discussed with Roderic Palau NP - will increase amiodarone back to 100mg  twice a day. Pt wife states his weight was up to 194 on Friday but is back down to 191 this morning. She will send remote on Friday if unable to send she will call me and we will make arrangements for OV at that time. To call if issues arise before then.

## 2019-04-30 NOTE — Telephone Encounter (Signed)
Attempted call back to wife and advised will route call to Afib clinic since no download is available to review fluid levels.

## 2019-04-30 NOTE — Telephone Encounter (Signed)
Wife called back and was getting medtronic error message on machine. Provided medtronic support services and advised to call for assistance.

## 2019-04-30 NOTE — Telephone Encounter (Signed)
Spoke with Rushie Goltz in [facility] and explained wife left message that patient is SOB on a patient check out direct line Friday when the office was closed for holiday.  Advised unable to get a download to check fluid levels and should I send call to Afib clinic since wife was concerned the SOB maybe related to afib.  She said to route call to her for follow up.

## 2019-04-30 NOTE — Telephone Encounter (Signed)
Returned call to wife as requested.  She said patient has been short of breath since Friday and not sure if it is related to Afib or fluid accumulation.  Advised the office was closed on Friday and an option for future episodes would be to call the [facility] office number for on call physician.  She said she did not have the number.  Provided office number for [facility] location or ER is another option if needed.  She will have patient send remote transmission for review.  Advised will call back after reviewing report.

## 2019-04-30 NOTE — Telephone Encounter (Signed)
New message     Patient had left a vm on my direct line on Friday stating that he is SOB and maybe in AFIB.  We were out of the office Friday due to the holiday.  I called pt this am.  He is still SOB and wanted to know if he should send in a transmission.  Please call

## 2019-04-30 NOTE — Telephone Encounter (Signed)
Wife left message she was unable to get through to [employer] tech support number and unable to get a remote transmission sent from home.

## 2019-05-01 ENCOUNTER — Telehealth: Payer: Self-pay

## 2019-05-01 ENCOUNTER — Ambulatory Visit (INDEPENDENT_AMBULATORY_CARE_PROVIDER_SITE_OTHER): Payer: HMO

## 2019-05-01 DIAGNOSIS — Z95810 Presence of automatic (implantable) cardiac defibrillator: Secondary | ICD-10-CM

## 2019-05-01 DIAGNOSIS — I5022 Chronic systolic (congestive) heart failure: Secondary | ICD-10-CM

## 2019-05-01 LAB — CUP PACEART REMOTE DEVICE CHECK
Battery Remaining Longevity: 7 mo
Battery Voltage: 2.82 V
Brady Statistic AP VP Percent: 3.09 %
Brady Statistic AP VS Percent: 4.72 %
Brady Statistic AS VP Percent: 20.7 %
Brady Statistic AS VS Percent: 71.49 %
Brady Statistic RA Percent Paced: 7.51 %
Brady Statistic RV Percent Paced: 24.72 %
Date Time Interrogation Session: 20200706225649
HighPow Impedance: 46 Ohm
HighPow Impedance: 58 Ohm
Implantable Lead Implant Date: 20091215
Implantable Lead Implant Date: 20091215
Implantable Lead Implant Date: 20091215
Implantable Lead Location: 753858
Implantable Lead Location: 753859
Implantable Lead Location: 753860
Implantable Lead Model: 4196
Implantable Lead Model: 5076
Implantable Lead Model: 6947
Implantable Pulse Generator Implant Date: 20150113
Lead Channel Impedance Value: 4047 Ohm
Lead Channel Impedance Value: 418 Ohm
Lead Channel Impedance Value: 456 Ohm
Lead Channel Impedance Value: 475 Ohm
Lead Channel Impedance Value: 513 Ohm
Lead Channel Impedance Value: 836 Ohm
Lead Channel Pacing Threshold Amplitude: 0.875 V
Lead Channel Pacing Threshold Amplitude: 1 V
Lead Channel Pacing Threshold Amplitude: 1.375 V
Lead Channel Pacing Threshold Pulse Width: 0.4 ms
Lead Channel Pacing Threshold Pulse Width: 0.4 ms
Lead Channel Pacing Threshold Pulse Width: 0.6 ms
Lead Channel Sensing Intrinsic Amplitude: 0.5 mV
Lead Channel Sensing Intrinsic Amplitude: 0.5 mV
Lead Channel Sensing Intrinsic Amplitude: 13.5 mV
Lead Channel Sensing Intrinsic Amplitude: 13.5 mV
Lead Channel Setting Pacing Amplitude: 1.5 V
Lead Channel Setting Pacing Amplitude: 2 V
Lead Channel Setting Pacing Amplitude: 2.5 V
Lead Channel Setting Pacing Pulse Width: 0.6 ms
Lead Channel Setting Pacing Pulse Width: 1 ms
Lead Channel Setting Sensing Sensitivity: 0.3 mV

## 2019-05-01 NOTE — Telephone Encounter (Signed)
Remote ICM transmission received.  Attempted call to wife regarding ICM remote transmission and left message, per DPR, to return call. 

## 2019-05-01 NOTE — Progress Notes (Signed)
EPIC Encounter for ICM Monitoring  Patient Name: [name] is a 85 y.o. male Date: 05/01/2019 Primary Care Physican: [name], MD Primary [place] Electrophysiologist: Allred BiV Pacing:24.7% which is a decrease from 40.8% 04/18/2019 Weight:187-191 lbs 04/30/2019 Weight: 191 lbs  Estimated remaining battery longevity 6 months.  Clinical Status (16-Apr-2019 to 30-Apr-2019)  AT/AF                  136 episodes  Atrial Pacing         7.5%  Time in AT/AF      23.8 hr/day (99.3%)  Longest AT/AF      16 hours  Observations (4) (16-Apr-2019 to 30-Apr-2019)  AT/AF >= 6 hr for 15 days.  Avg. Ventricular Rate >= 100 bpm during AT/AF (>= 6 hr) for 1 days.  V. Pacing less than 90%.  Night heart rate over 85 bpm for 7 days.           Spoke with wife today, 7/7.  Heart failure questions and patient is  of breath.  Weight is 191 lbs today but lowest wait in last couple of weeks was 188 lbs on 6/21.  Patient reports he is not feeling well at all.         Recommendation given to come in for office visit since he is not feeling well, he has some fluid accumulation and report appears to show he is afib which was addressed by afib clinic on 7/6.         Message sent to afib clinic today to inform remote transmission was sent last night.           See 7/6 phone note for follow up with Afib clinic and Amiodarone was increased.          OptivolThoracic impedanceabnormal suggesting possible fluid accumulation since 04/15/2019 and is ongoing.  Prescribed:Furosemide40 mg take1 tablet daily.  Labs: 01/13/2019 Creatinine1.11Doneta Public, Potassium4.2, Sodium138, GFR>60 01/12/2019 Creatinine1.35, Doneta Public, Potassium3.6, [id number], [id number]  01/11/2019 Creatinine1.17, BUN19, Potassium3.8, Sodium135, GFR57->60  01/10/2019 Creatinine1.08, BUN21, Potassium3.9, [id number], GFR>60  01/09/2019 Creatinine1.22, BUN23, Potassium4.0,  [id number], GFR54>60  01/08/2019 Creatinine1.44, BUN24, Potassium4.2, [id number], [id number]  Acomplete set of results can be found in Results Review.  Recommendations:  Message sent to device clinic to review 7/6 report and afib clinic of updated report.  Scheduled patient to be seen by Richardson Dopp, PA on 7/8 at 2:45 PM since Dr Irish Lack is out of the office this week.    Advised wife and patient to wear mask to appointment tomorrow.   Follow-up plan: ICM clinic phone appointment on7/13/2020 to recheck fluid levels.  Copy of ICM check sent to Dr.Allred and Dr Irish Lack.   3 month ICM trend: 04/30/2019    AT/AF   1 Year ICM trend:       Rosalene Billings, RN 05/01/2019 8:48 AM

## 2019-05-02 ENCOUNTER — Encounter: Payer: Self-pay | Admitting: Physician Assistant

## 2019-05-02 ENCOUNTER — Inpatient Hospital Stay (HOSPITAL_COMMUNITY)
Admission: AD | Admit: 2019-05-02 | Discharge: 2019-05-05 | DRG: 291 | Disposition: A | Discharge status: Home / Self Care | Payer: HMO | Source: Ambulatory Visit | Attending: Internal Medicine | Admitting: Internal Medicine

## 2019-05-02 ENCOUNTER — Other Ambulatory Visit: Payer: Self-pay

## 2019-05-02 ENCOUNTER — Ambulatory Visit (INDEPENDENT_AMBULATORY_CARE_PROVIDER_SITE_OTHER): Payer: HMO | Admitting: Physician Assistant

## 2019-05-02 VITALS — BP 120/82 | HR 88 | Ht 66.0 in | Wt 195.0 lb

## 2019-05-02 DIAGNOSIS — I5023 Acute on chronic systolic (congestive) heart failure: Secondary | ICD-10-CM | POA: Diagnosis present

## 2019-05-02 DIAGNOSIS — I129 Hypertensive chronic kidney disease with stage 1 through stage 4 chronic kidney disease, or unspecified chronic kidney disease: Secondary | ICD-10-CM | POA: Diagnosis not present

## 2019-05-02 DIAGNOSIS — I25118 Atherosclerotic heart disease of native coronary artery with other forms of angina pectoris: Secondary | ICD-10-CM | POA: Diagnosis not present

## 2019-05-02 DIAGNOSIS — Z95810 Presence of automatic (implantable) cardiac defibrillator: Secondary | ICD-10-CM

## 2019-05-02 DIAGNOSIS — E039 Hypothyroidism, unspecified: Secondary | ICD-10-CM | POA: Diagnosis present

## 2019-05-02 DIAGNOSIS — Z888 Allergy status to other drugs, medicaments and biological substances status: Secondary | ICD-10-CM

## 2019-05-02 DIAGNOSIS — G4733 Obstructive sleep apnea (adult) (pediatric): Secondary | ICD-10-CM | POA: Diagnosis not present

## 2019-05-02 DIAGNOSIS — Z8673 Personal history of transient ischemic attack (TIA), and cerebral infarction without residual deficits: Secondary | ICD-10-CM

## 2019-05-02 DIAGNOSIS — Z20828 Contact with and (suspected) exposure to other viral communicable diseases: Secondary | ICD-10-CM | POA: Diagnosis present

## 2019-05-02 DIAGNOSIS — I4891 Unspecified atrial fibrillation: Secondary | ICD-10-CM | POA: Diagnosis present

## 2019-05-02 DIAGNOSIS — Z823 Family history of stroke: Secondary | ICD-10-CM

## 2019-05-02 DIAGNOSIS — Z91048 Other nonmedicinal substance allergy status: Secondary | ICD-10-CM

## 2019-05-02 DIAGNOSIS — T82110S Breakdown (mechanical) of cardiac electrode, sequela: Secondary | ICD-10-CM

## 2019-05-02 DIAGNOSIS — K219 Gastro-esophageal reflux disease without esophagitis: Secondary | ICD-10-CM | POA: Diagnosis not present

## 2019-05-02 DIAGNOSIS — J439 Emphysema, unspecified: Secondary | ICD-10-CM | POA: Diagnosis not present

## 2019-05-02 DIAGNOSIS — I4819 Other persistent atrial fibrillation: Secondary | ICD-10-CM | POA: Diagnosis not present

## 2019-05-02 DIAGNOSIS — E1122 Type 2 diabetes mellitus with diabetic chronic kidney disease: Secondary | ICD-10-CM | POA: Diagnosis present

## 2019-05-02 DIAGNOSIS — E118 Type 2 diabetes mellitus with unspecified complications: Secondary | ICD-10-CM

## 2019-05-02 DIAGNOSIS — I252 Old myocardial infarction: Secondary | ICD-10-CM | POA: Diagnosis not present

## 2019-05-02 DIAGNOSIS — Z951 Presence of aortocoronary bypass graft: Secondary | ICD-10-CM

## 2019-05-02 DIAGNOSIS — I420 Dilated cardiomyopathy: Secondary | ICD-10-CM | POA: Diagnosis not present

## 2019-05-02 DIAGNOSIS — I255 Ischemic cardiomyopathy: Secondary | ICD-10-CM | POA: Diagnosis not present

## 2019-05-02 DIAGNOSIS — Z87442 Personal history of urinary calculi: Secondary | ICD-10-CM

## 2019-05-02 DIAGNOSIS — I509 Heart failure, unspecified: Secondary | ICD-10-CM

## 2019-05-02 DIAGNOSIS — I2510 Atherosclerotic heart disease of native coronary artery without angina pectoris: Secondary | ICD-10-CM | POA: Diagnosis present

## 2019-05-02 DIAGNOSIS — I130 Hypertensive heart and chronic kidney disease with heart failure and stage 1 through stage 4 chronic kidney disease, or unspecified chronic kidney disease: Principal | ICD-10-CM | POA: Diagnosis present

## 2019-05-02 DIAGNOSIS — E1140 Type 2 diabetes mellitus with diabetic neuropathy, unspecified: Secondary | ICD-10-CM | POA: Diagnosis not present

## 2019-05-02 DIAGNOSIS — I480 Paroxysmal atrial fibrillation: Secondary | ICD-10-CM

## 2019-05-02 DIAGNOSIS — J449 Chronic obstructive pulmonary disease, unspecified: Secondary | ICD-10-CM | POA: Diagnosis not present

## 2019-05-02 DIAGNOSIS — Z825 Family history of asthma and other chronic lower respiratory diseases: Secondary | ICD-10-CM

## 2019-05-02 DIAGNOSIS — N400 Enlarged prostate without lower urinary tract symptoms: Secondary | ICD-10-CM | POA: Diagnosis not present

## 2019-05-02 DIAGNOSIS — Z8701 Personal history of pneumonia (recurrent): Secondary | ICD-10-CM

## 2019-05-02 DIAGNOSIS — Z87730 Personal history of (corrected) cleft lip and palate: Secondary | ICD-10-CM

## 2019-05-02 DIAGNOSIS — E785 Hyperlipidemia, unspecified: Secondary | ICD-10-CM | POA: Diagnosis not present

## 2019-05-02 DIAGNOSIS — Z7901 Long term (current) use of anticoagulants: Secondary | ICD-10-CM | POA: Diagnosis not present

## 2019-05-02 DIAGNOSIS — R911 Solitary pulmonary nodule: Secondary | ICD-10-CM | POA: Diagnosis not present

## 2019-05-02 DIAGNOSIS — Z79890 Hormone replacement therapy (postmenopausal): Secondary | ICD-10-CM

## 2019-05-02 DIAGNOSIS — Z794 Long term (current) use of insulin: Secondary | ICD-10-CM | POA: Diagnosis not present

## 2019-05-02 DIAGNOSIS — E1142 Type 2 diabetes mellitus with diabetic polyneuropathy: Secondary | ICD-10-CM | POA: Diagnosis not present

## 2019-05-02 DIAGNOSIS — I272 Pulmonary hypertension, unspecified: Secondary | ICD-10-CM | POA: Diagnosis not present

## 2019-05-02 DIAGNOSIS — I5022 Chronic systolic (congestive) heart failure: Secondary | ICD-10-CM | POA: Diagnosis not present

## 2019-05-02 DIAGNOSIS — N183 Chronic kidney disease, stage 3 (moderate): Secondary | ICD-10-CM | POA: Diagnosis present

## 2019-05-02 DIAGNOSIS — Z79899 Other long term (current) drug therapy: Secondary | ICD-10-CM

## 2019-05-02 DIAGNOSIS — E109 Type 1 diabetes mellitus without complications: Secondary | ICD-10-CM | POA: Diagnosis not present

## 2019-05-02 DIAGNOSIS — E1151 Type 2 diabetes mellitus with diabetic peripheral angiopathy without gangrene: Secondary | ICD-10-CM | POA: Diagnosis not present

## 2019-05-02 DIAGNOSIS — R0602 Shortness of breath: Secondary | ICD-10-CM | POA: Diagnosis not present

## 2019-05-02 DIAGNOSIS — Z8249 Family history of ischemic heart disease and other diseases of the circulatory system: Secondary | ICD-10-CM

## 2019-05-02 DIAGNOSIS — Z87891 Personal history of nicotine dependence: Secondary | ICD-10-CM

## 2019-05-02 LAB — COMPREHENSIVE METABOLIC PANEL
ALT: 14 U/L (ref 0–44)
AST: 20 U/L (ref 15–41)
Albumin: 3.4 g/dL — ABNORMAL LOW (ref 3.5–5.0)
Alkaline Phosphatase: 81 U/L (ref 38–126)
Anion gap: 9 (ref 5–15)
BUN: 17 mg/dL (ref 8–23)
CO2: 28 mmol/L (ref 22–32)
Calcium: 9.2 mg/dL (ref 8.9–10.3)
Chloride: 102 mmol/L (ref 98–111)
Creatinine, Ser: 1.25 mg/dL — ABNORMAL HIGH (ref 0.61–1.24)
GFR calc Af Amer: >60 mL/min (ref >60)
GFR calc non Af Amer: 53 mL/min — ABNORMAL LOW (ref >60)
Glucose, Bld: 136 mg/dL — ABNORMAL HIGH (ref 70–99)
Potassium: 4.5 mmol/L (ref 3.5–5.1)
Sodium: 139 mmol/L (ref 135–145)
Total Bilirubin: 0.7 mg/dL (ref 0.3–1.2)
Total Protein: 7 g/dL (ref 6.5–8.1)

## 2019-05-02 LAB — CBC WITH DIFFERENTIAL/PLATELET
Abs Immature Granulocytes: 0.02 10*3/uL (ref 0.00–0.07)
Basophils Absolute: 0 10*3/uL (ref 0.0–0.1)
Basophils Relative: 1 %
Eosinophils Absolute: 0.3 10*3/uL (ref 0.0–0.5)
Eosinophils Relative: 3 %
HCT: 40.9 % (ref 39.0–52.0)
Hemoglobin: 13.2 g/dL (ref 13.0–17.0)
Immature Granulocytes: 0 %
Lymphocytes Relative: 18 %
Lymphs Abs: 1.5 10*3/uL (ref 0.7–4.0)
MCH: 30.4 pg (ref 26.0–34.0)
MCHC: 32.3 g/dL (ref 30.0–36.0)
MCV: 94.2 fL (ref 80.0–100.0)
Monocytes Absolute: 0.5 10*3/uL (ref 0.1–1.0)
Monocytes Relative: 7 %
Neutro Abs: 5.9 10*3/uL (ref 1.7–7.7)
Neutrophils Relative %: 71 %
Platelets: 218 10*3/uL (ref 150–400)
RBC: 4.34 MIL/uL (ref 4.22–5.81)
RDW: 14.8 % (ref 11.5–15.5)
WBC: 8.3 10*3/uL (ref 4.0–10.5)
nRBC: 0 % (ref 0.0–0.2)

## 2019-05-02 LAB — GLUCOSE, CAPILLARY
Glucose-Capillary: 119 mg/dL — ABNORMAL HIGH (ref 70–99)
Glucose-Capillary: 207 mg/dL — ABNORMAL HIGH (ref 70–99)

## 2019-05-02 LAB — PROTIME-INR
INR: 3 — ABNORMAL HIGH (ref 0.8–1.2)
Prothrombin Time: 30.8 seconds — ABNORMAL HIGH (ref 11.4–15.2)

## 2019-05-02 LAB — BRAIN NATRIURETIC PEPTIDE: B Natriuretic Peptide: 1255.8 pg/mL — ABNORMAL HIGH (ref 0.0–100.0)

## 2019-05-02 LAB — SARS CORONAVIRUS 2 BY RT PCR (HOSPITAL ORDER, PERFORMED IN ~~LOC~~ HOSPITAL LAB): SARS Coronavirus 2: NEGATIVE

## 2019-05-02 MED ORDER — CARVEDILOL 3.125 MG PO TABS
3.1250 mg | ORAL_TABLET | Freq: Two times a day (BID) | ORAL | Status: DC
  Administered 2019-05-02 – 2019-05-05 (×6): 3.125 mg via ORAL
  Filled 2019-05-02 (×6): qty 1

## 2019-05-02 MED ORDER — ALBUTEROL SULFATE (2.5 MG/3ML) 0.083% IN NEBU
2.5000 mg | INHALATION_SOLUTION | Freq: Three times a day (TID) | RESPIRATORY_TRACT | Status: DC
  Administered 2019-05-02 – 2019-05-05 (×8): 2.5 mg via RESPIRATORY_TRACT
  Filled 2019-05-02 (×8): qty 3

## 2019-05-02 MED ORDER — FUROSEMIDE 10 MG/ML IJ SOLN
40.0000 mg | Freq: Two times a day (BID) | INTRAMUSCULAR | Status: DC
  Administered 2019-05-02 – 2019-05-05 (×6): 40 mg via INTRAVENOUS
  Filled 2019-05-02 (×6): qty 4

## 2019-05-02 MED ORDER — SODIUM CHLORIDE 0.9% FLUSH: 3.0000 mL | INTRAVENOUS | Status: DC | PRN

## 2019-05-02 MED ORDER — ENSURE ENLIVE PO LIQD
237.0000 mL | Freq: Two times a day (BID) | ORAL | Status: DC
  Administered 2019-05-03 – 2019-05-05 (×5): 237 mL via ORAL

## 2019-05-02 MED ORDER — INSULIN ASPART 100 UNIT/ML ~~LOC~~ SOLN
0.0000 [IU] | Freq: Every day | SUBCUTANEOUS | Status: DC
  Administered 2019-05-02: 2 [IU] via SUBCUTANEOUS

## 2019-05-02 MED ORDER — ONDANSETRON HCL 4 MG/2ML IJ SOLN: 4.0000 mg | Freq: Four times a day (QID) | INTRAMUSCULAR | Status: DC | PRN

## 2019-05-02 MED ORDER — VITAMIN D 25 MCG (1000 UNIT) PO TABS
2000.0000 [IU] | ORAL_TABLET | Freq: Every evening | ORAL | Status: DC
  Administered 2019-05-02 – 2019-05-04 (×3): 2000 [IU] via ORAL
  Filled 2019-05-02 (×3): qty 2

## 2019-05-02 MED ORDER — ACETAMINOPHEN 500 MG PO TABS: 1000.0000 mg | ORAL_TABLET | Freq: Every evening | ORAL | Status: DC | PRN

## 2019-05-02 MED ORDER — INSULIN DEGLUDEC-LIRAGLUTIDE 100-3.6 UNIT-MG/ML ~~LOC~~ SOPN: 14.0000 [IU] | PEN_INJECTOR | Freq: Every morning | SUBCUTANEOUS | Status: DC

## 2019-05-02 MED ORDER — INSULIN ASPART 100 UNIT/ML ~~LOC~~ SOLN
0.0000 [IU] | Freq: Three times a day (TID) | SUBCUTANEOUS | Status: DC
  Administered 2019-05-03 – 2019-05-04 (×2): 2 [IU] via SUBCUTANEOUS
  Administered 2019-05-04: 3 [IU] via SUBCUTANEOUS
  Administered 2019-05-05: 2 [IU] via SUBCUTANEOUS

## 2019-05-02 MED ORDER — WARFARIN SODIUM 5 MG PO TABS: 5.0000 mg | ORAL_TABLET | Freq: Every day | ORAL | Status: DC

## 2019-05-02 MED ORDER — WARFARIN - PHARMACIST DOSING INPATIENT
Freq: Every day | Status: DC
  Administered 2019-05-02 – 2019-05-04 (×3)

## 2019-05-02 MED ORDER — LEVOTHYROXINE SODIUM 100 MCG PO TABS
100.0000 ug | ORAL_TABLET | Freq: Every day | ORAL | Status: DC
  Administered 2019-05-03 – 2019-05-05 (×3): 100 ug via ORAL
  Filled 2019-05-02 (×3): qty 1

## 2019-05-02 MED ORDER — FLUTICASONE PROPIONATE 50 MCG/ACT NA SUSP
2.0000 | Freq: Every day | NASAL | Status: DC
  Administered 2019-05-03 – 2019-05-05 (×3): 2 via NASAL
  Filled 2019-05-02: qty 16

## 2019-05-02 MED ORDER — OCUVITE-LUTEIN PO CAPS
1.0000 | ORAL_CAPSULE | Freq: Two times a day (BID) | ORAL | Status: DC
  Administered 2019-05-02: 1 via ORAL
  Filled 2019-05-02 (×2): qty 1

## 2019-05-02 MED ORDER — SODIUM CHLORIDE 0.9 % IV SOLN: 250.0000 mL | INTRAVENOUS | Status: DC | PRN

## 2019-05-02 MED ORDER — FLUTICASONE FUROATE-VILANTEROL 100-25 MCG/INH IN AEPB
1.0000 | INHALATION_SPRAY | Freq: Every day | RESPIRATORY_TRACT | Status: DC
  Administered 2019-05-03 – 2019-05-05 (×3): 1 via RESPIRATORY_TRACT
  Filled 2019-05-02: qty 28

## 2019-05-02 MED ORDER — TRAMADOL HCL 50 MG PO TABS
50.0000 mg | ORAL_TABLET | Freq: Every evening | ORAL | Status: DC | PRN
  Administered 2019-05-02 – 2019-05-03 (×2): 50 mg via ORAL
  Filled 2019-05-02 (×2): qty 1

## 2019-05-02 MED ORDER — ATORVASTATIN CALCIUM 80 MG PO TABS
80.0000 mg | ORAL_TABLET | Freq: Every day | ORAL | Status: DC
  Administered 2019-05-02 – 2019-05-04 (×3): 80 mg via ORAL
  Filled 2019-05-02 (×3): qty 1

## 2019-05-02 MED ORDER — POTASSIUM CHLORIDE CRYS ER 20 MEQ PO TBCR
20.0000 meq | EXTENDED_RELEASE_TABLET | Freq: Two times a day (BID) | ORAL | Status: DC
  Administered 2019-05-02 – 2019-05-05 (×6): 20 meq via ORAL
  Filled 2019-05-02 (×6): qty 1

## 2019-05-02 MED ORDER — UMECLIDINIUM BROMIDE 62.5 MCG/INH IN AEPB
1.0000 | INHALATION_SPRAY | Freq: Every day | RESPIRATORY_TRACT | Status: DC
  Administered 2019-05-03 – 2019-05-05 (×3): 1 via RESPIRATORY_TRACT
  Filled 2019-05-02: qty 7

## 2019-05-02 MED ORDER — ALBUTEROL SULFATE (2.5 MG/3ML) 0.083% IN NEBU: 2.5000 mg | INHALATION_SOLUTION | Freq: Three times a day (TID) | RESPIRATORY_TRACT | Status: DC

## 2019-05-02 MED ORDER — DM-GUAIFENESIN ER 30-600 MG PO TB12: 1.0000 | ORAL_TABLET | Freq: Two times a day (BID) | ORAL | Status: DC | PRN

## 2019-05-02 MED ORDER — VITAMIN B-12 1000 MCG PO TABS
1000.0000 ug | ORAL_TABLET | Freq: Every day | ORAL | Status: DC
  Administered 2019-05-03 – 2019-05-05 (×3): 1000 ug via ORAL
  Filled 2019-05-02 (×3): qty 1

## 2019-05-02 MED ORDER — WARFARIN SODIUM 5 MG PO TABS
5.0000 mg | ORAL_TABLET | Freq: Once | ORAL | Status: AC
  Administered 2019-05-02: 5 mg via ORAL
  Filled 2019-05-02: qty 1

## 2019-05-02 MED ORDER — FLUTICASONE-UMECLIDIN-VILANT 100-62.5-25 MCG/INH IN AEPB: 1.0000 | INHALATION_SPRAY | RESPIRATORY_TRACT | Status: DC

## 2019-05-02 MED ORDER — SODIUM CHLORIDE 0.9% FLUSH
3.0000 mL | Freq: Two times a day (BID) | INTRAVENOUS | Status: DC
  Administered 2019-05-02 – 2019-05-05 (×6): 3 mL via INTRAVENOUS

## 2019-05-02 MED ORDER — FERROUS SULFATE 325 (65 FE) MG PO TABS
325.0000 mg | ORAL_TABLET | Freq: Every evening | ORAL | Status: DC
  Administered 2019-05-02 – 2019-05-04 (×3): 325 mg via ORAL
  Filled 2019-05-02 (×3): qty 1

## 2019-05-02 MED ORDER — CALCIUM CARBONATE ANTACID 500 MG PO CHEW
1.0000 | CHEWABLE_TABLET | Freq: Three times a day (TID) | ORAL | Status: DC | PRN
  Administered 2019-05-03: 200 mg via ORAL
  Filled 2019-05-02: qty 1

## 2019-05-02 MED ORDER — SENNA 8.6 MG PO TABS
1.0000 | ORAL_TABLET | Freq: Two times a day (BID) | ORAL | Status: DC
  Administered 2019-05-02 – 2019-05-05 (×6): 8.6 mg via ORAL
  Filled 2019-05-02 (×6): qty 1

## 2019-05-02 MED ORDER — AMIODARONE HCL 200 MG PO TABS
400.0000 mg | ORAL_TABLET | Freq: Two times a day (BID) | ORAL | Status: DC
  Administered 2019-05-02 – 2019-05-05 (×6): 400 mg via ORAL
  Filled 2019-05-02 (×6): qty 2

## 2019-05-02 NOTE — Progress Notes (Signed)
ANTICOAGULATION CONSULT NOTE - Initial Consult  Pharmacy Consult for warfarin  Indication: atrial fibrillation pending cardioversion on 7/10  Allergies  Allergen Reactions  . Aldactone [Spironolactone] Other (See Comments)    Hyperkalemia  reported by Dr. [name] - pt is currently taking 12.5 mg daily -November 2017 per medication list by same MD    Patient Measurements: Height: 5\' 6"  (167.6 cm) Weight: 191 lb 6.4 oz (86.8 kg) IBW/kg (Calculated) : 63.8 Heparin Dosing Weight: 81.9 kg  Vital Signs: Temp: 97.6 F (36.4 C) (07/08 1732) Temp Source: Oral (07/08 1732) BP: 146/99 (07/08 1732) Pulse Rate: 104 (07/08 1732)  Labs: Recent Labs    05/02/19 1813  HGB 13.2  HCT 40.9  PLT 218  LABPROT 30.8*  INR 3.0*  CREATININE 1.25*   Estimated Creatinine Clearance: 46.2 mL/min (A) (by C-G formula based on SCr of 1.25 mg/dL (H)).  Medical History: Past Medical History:  Diagnosis Date  . Atrial fibrillation ([place])    persistent, previously seen at [facility] and placed on amiodarone  . Benign prostatic hypertrophy   . CAD (coronary artery disease)    multivessel s/p inferolateral wall MI with subsequent CABG 11/1998.  Cath 2009 with Patent grafts  . Cleft palate   . COPD with emphysema ([place]) 04/01/2010  . DM (diabetes mellitus), type 2 ([place])   . Dyspnea   . GERD (gastroesophageal reflux disease)   . HTN (hypertension)   . Hyperlipidemia   . Hypothyroidism   . Iron deficiency anemia   . Ischemic dilated cardiomyopathy ([place])    EF 35-40% by MUGA 6/11  . Myocardial infarction ([place])   . Nasal septal deviation   . Nephrolithiasis   . OSA (obstructive sleep apnea)   . PAF (paroxysmal atrial fibrillation) ([place])   . Peripheral arterial disease (HCC)    left subclavian artery stenosis  . PNA (pneumonia)   . Psoriasis   . Seborrheic keratosis   . Stroke ([place])   . Systolic congestive heart failure ([place]) 2009   s/p BiV ICD implantation by Dr Leonia Reeves (MDT)    Medications:   Scheduled:  . albuterol  2.5 mg Nebulization TID  . amiodarone  400 mg Oral BID  . atorvastatin  80 mg Oral q1800  . carvedilol  3.125 mg Oral See admin instructions  . cholecalciferol  2,000 Units Oral QPM  . [START ON 05/03/2019] feeding supplement (ENSURE ENLIVE)  237 mL Oral BID BM  . ferrous sulfate  325 mg Oral QPM  . [START ON 05/03/2019] fluticasone  2 spray Each Nare BH-q7a  . [START ON 05/03/2019] Fluticasone-Umeclidin-Vilant  1 puff Oral BH-q7a  . furosemide  40 mg Intravenous BID  . [START ON 05/03/2019] insulin aspart  0-15 Units Subcutaneous TID WC  . insulin aspart  0-5 Units Subcutaneous QHS  . [START ON 05/03/2019] Insulin Degludec-Liraglutide  14 Units Subcutaneous q morning - 10a  . [START ON 05/03/2019] levothyroxine  100 mcg Oral QAC breakfast  . potassium chloride  20 mEq Oral BID  . PreserVision AREDS   Oral BID  . senna  1 tablet Oral BID  . sodium chloride flush  3 mL Intravenous Q12H  . vitamin B-12  1,000 mcg Oral Daily  . warfarin  5 mg Oral q1800   Infusions:  . sodium chloride      Assessment: 32 yom presents with persistent a fib pending cardioversion for 7/10. Pt on warfarin 5 mg daily PTA per coumadin clinic note on 6/29. Last dose on night  of 7/7. Baseline INR 3. Per cards note on 7/8, plan to increase pt amiodarone dose which may affect INR   Goal of Therapy:  INR 2-3 Monitor platelets by anticoagulation protocol: Yes   Plan:  Start warfarin 5 mg  Check daily INR Continue to monitor for any amiodarone drug interaction  Continue to monitor H&H and platelets    Thank you,  Eddie Candle, PharmD PGY-1 Pharmacy Resident

## 2019-05-02 NOTE — H&P (Addendum)
Cardiology Admission History & Physical:    Date:  05/02/2019   ID:  [name], DOB Jun 12, 1934, MRN [id number]  PCP:  [name], MD  Cardiologist:  Larae Grooms, MD   Electrophysiologist:  Thompson Grayer, MD   Referring MD: [name], MD   Chief Complaint  Patient presents with  . Shortness of Breath    History of Present Illness:    [name] is a 85 y.o. male with:  Coronary artery disease   S/p CABG 2000  LHC 2009: patent bypass grafts  Myoview in 2011: scar but no ischemia  Chronic systolic CHF (EF 01-75)  Spironolactone DC'd in past due to low BP  Ischemic CM  S/p CRT-D  Persistent AFib  S/p PVI ablation 2017  Amiodarone Rx  CHADS2-VASc=8 (age x 2, CVA, HTN, CAD, CHF, Diabetes)  Hypertension  Hyperlipidemia  Hx of CVA  Diabetes mellitus  COPD  Chronic kidney disease  Hypothyroidism  PAD - L subclavian stenosis  OSA on CPAP   [name] was evaluated in the AFib Clinic last month due to recurrent AFib noted on his device.  His Amiodarone was increased with restoration of normal sinus rhythm.  He then called in with shortness of breath and weight gain.  His thoracic impedance was decreased indicating fluid overload.  His device demonstrated recurrent AFib.  [name] Greenland, NP with the AFib clinic increased his Amiodarone again.  He now presents for further evaluation and management.  He is here today with his wife.  He started feeling poorly last Thursday.  He has been short of breath with minimal activity.  This has progressively worsened over that time.  He has to sleep on an incline and does have PND.  His legs have been more swollen.  He has not had chest discomfort.  He has not had syncope.  He is short of breath at rest at times.   Prior CV studies:   The following studies were reviewed today:  Echocardiogram 09/19/2018 EF 30-35, inferolateral, inferior hypokinesis to akinesis, lateral hypokinesis, apical  septal/apical anterior/apical akinesis, aortic root 39 mm, mild MR, severe LAE, mild RAE, trivial PI, PASP 45  Carotid US 09/28/2014 Bilateral ICA 1-39; left subclavian steal  Myoview 03/19/2010 EF 28, inf, inf-lat scar, no ischemia; Low Risk  Cardiac Catheterization 08/2008 LM 100 LAD patent; Dx 80 OM patent RCA patent L-LAD patent S-OM patent S-Dx patent  EF 20-25  Past Medical History:  Diagnosis Date  . Atrial fibrillation ([place])    persistent, previously seen at [facility] and placed on amiodarone  . Benign prostatic hypertrophy   . CAD (coronary artery disease)    multivessel s/p inferolateral wall MI with subsequent CABG 11/1998.  Cath 2009 with Patent grafts  . Cleft palate   . COPD with emphysema ([place]) 04/01/2010  . DM (diabetes mellitus), type 2 ([place])   . Dyspnea   . GERD (gastroesophageal reflux disease)   . HTN (hypertension)   . Hyperlipidemia   . Hypothyroidism   . Iron deficiency anemia   . Ischemic dilated cardiomyopathy ([place])    EF 35-40% by MUGA 6/11  . Myocardial infarction ([place])   . Nasal septal deviation   . Nephrolithiasis   . OSA (obstructive sleep apnea)   . PAF (paroxysmal atrial fibrillation) ([place])   . Peripheral arterial disease (HCC)    left subclavian artery stenosis  . PNA (pneumonia)   . Psoriasis   . Seborrheic keratosis   . Stroke ([name])   .  Systolic congestive heart failure ([place]) 2009   s/p BiV ICD implantation by Dr Leonia Reeves (MDT)   Surgical Hx: The patient  has a past surgical history that includes Carpal tunnel release; left cleft palate and left cleft lip repair; Cataract extraction; c-spine surgery; Bi-ventricular implantable cardioverter defibrillator  (crt-d) (10-08-08; 11-06-2013); Coronary artery bypass graft; Biv icd genertaor change out (N/A, 11/06/2013); Cardioversion (N/A, 04/29/2016); and Cardiac catheterization (N/A, 06/11/2016).   Current Medications: Current Meds  Medication Sig  . acetaminophen (TYLENOL) 500 MG tablet Take 1,000  mg by mouth at bedtime as needed for mild pain or moderate pain.  [name] albuterol (PROVENTIL) (2.5 MG/3ML) 0.083% nebulizer solution USE 1 VIAL VIA NEBULIZER 3 TIMES A DAY AS DIRECTED. DX:J44.9 (Patient taking differently: Take 2.5 mg by nebulization 3 (three) times daily. )  . amiodarone (PACERONE) 200 MG tablet Take 200 mg by mouth daily.   [name] atorvastatin (LIPITOR) 80 MG tablet TAKE 1 TABLET (80 MG TOTAL) BY MOUTH DAILY.  . carvedilol (COREG) 6.25 MG tablet Take 0.5 tablets (3.125 mg total) by mouth 2 (two) times daily. Take if systolic blood pressure >277 (Patient taking differently: Take 3.125 mg by mouth See admin instructions. Take if systolic blood pressure >412 take 3.125 mg and if below he does not take any thing)  . cholecalciferol (VITAMIN D) 1000 units tablet Take 2,000 Units by mouth every evening.  [name] dextromethorphan-guaiFENesin (MUCINEX DM) 30-600 MG 12hr tablet Take 1 tablet by mouth 2 (two) times daily as needed for cough.  . feeding supplement, ENSURE ENLIVE, (ENSURE ENLIVE) LIQD Take 237 mLs by mouth 2 (two) times daily between meals.  . ferrous sulfate 325 (65 FE) MG tablet Take 325 mg by mouth every evening.   . fluticasone (FLONASE) 50 MCG/ACT nasal spray SPRAY 2 SPRAYS INTO EACH NOSTRIL EVERY DAY (Patient taking differently: Place 2 sprays into both nostrils every morning. )  . furosemide (LASIX) 40 MG tablet Take 1 tablet (40 mg total) by mouth daily.  . Insulin Degludec-Liraglutide (XULTOPHY) 100-3.6 UNIT-MG/ML SOPN Inject 14 Units into the skin every morning.   [name] levothyroxine (SYNTHROID, LEVOTHROID) 100 MCG tablet Take 100 mcg by mouth daily before breakfast.   . Multiple Vitamins-Minerals (PRESERVISION AREDS PO) Take 1 tablet by mouth 2 (two) times daily.  . potassium chloride 20 MEQ TBCR Take 20 mEq by mouth daily.  [name] PRESCRIPTION MEDICATION Inhale into the lungs at bedtime. CPAP  . senna (SENOKOT) 8.6 MG tablet Take 1 tablet by mouth 2 (two) times daily.   . traMADol  (ULTRAM) 50 MG tablet Take 50 mg by mouth at bedtime as needed for moderate pain or severe pain.   . TRELEGY ELLIPTA 100-62.5-25 MCG/INH AEPB INHALE 1 PUFF INTO LUNGS ONCE A DAY (Patient taking differently: Take 1 puff by mouth every morning. )  . vitamin B-12 (CYANOCOBALAMIN) 1000 MCG tablet Take 1,000 mcg by mouth daily.  [name] warfarin (COUMADIN) 5 MG tablet Take as directed by Coumadin Clinic     Allergies:   Aldactone [spironolactone]   Social History   Tobacco Use  . Smoking status: Former Smoker    Packs/day: 3.00    Years: 65.00    Pack years: 195.00    Types: Cigarettes    Quit date: 10/25/1998    Years since quitting: 20.5  . Smokeless tobacco: Never Used  Substance Use Topics  . Alcohol use: No    Alcohol/week: 0.0 standard drinks    Comment: remote history of heavy alcohol use  . Drug  use: No     Family Hx: The patient's family history includes Asthma in his father; Heart attack in his brother; Heart disease in his brother; Hypertension in his brother, father, mother, and sister; Stroke in his father.  ROS:   Please see the history of present illness.    ROS All other systems reviewed and are negative.   EKGs/Labs/Other Test Reviewed:    EKG:  EKG is  ordered today.  The ekg ordered today demonstrates V paced HR 92, 3 beats NSVT  Recent Labs: 07/13/2018: TSH 0.454 09/20/2018: Magnesium 2.1 01/08/2019: ALT 16 01/09/2019: B Natriuretic Peptide 569.8 01/10/2019: Hemoglobin 11.0; Platelets 218 01/13/2019: BUN 22; Creatinine, Ser 1.11; Potassium 4.2; Sodium 138   Recent Lipid Panel Lab Results  Component Value Date/Time   CHOL 137 11/12/2014 09:21 AM   TRIG 95 11/12/2014 09:21 AM   HDL 50 11/12/2014 09:21 AM   CHOLHDL 2.6 11/29/2008 03:30 AM   LDLCALC 68 11/12/2014 09:21 AM    Physical Exam:    VS:  BP 120/82   Pulse 88   Ht 5\' 6"  (1.676 m)   Wt 195 lb (88.5 kg)   SpO2 93%   BMI 31.47 kg/m     Wt Readings from Last 3 Encounters:  05/02/19 195 lb (88.5  kg)  04/20/19 193 lb 9.6 oz (87.8 kg)  01/22/19 187 lb (84.8 kg)     Physical Exam  Constitutional: He is oriented to person, place, and time. He appears well-developed and well-nourished. No distress.  HENT:  Head: Normocephalic and atraumatic.  Eyes: No scleral icterus.  Neck: No JVD (I cannot appreciate JVD) present. No thyromegaly present.  Cardiovascular: Normal rate, regular rhythm and normal heart sounds.  No murmur heard. Pulmonary/Chest: Effort normal. He has no wheezes. He has rales in the right lower field and the left lower field.  Abdominal: Soft. He exhibits no distension. There is no hepatomegaly.  Musculoskeletal:        General: Edema (trace-1+ bilat LE edema) present.  Lymphadenopathy:    He has no cervical adenopathy.  Neurological: He is alert and oriented to person, place, and time.  Skin: Skin is warm and dry.  Psychiatric: He has a normal mood and affect.    ASSESSMENT & PLAN:    1. Acute on chronic systolic congestive heart failure (HCC) EF 30-35.  NYHA 3-4.  He has developed progressively worsening shortness of breath over the past week in the setting of recurrent atrial fibrillation.  He has symptoms at rest at times.  It is difficult for him to lay back to do the EKG today.  I have recommended admission to the hospital for diuresis and subsequent cardioversion.  I reviewed this with Dr. Caryl Comes (attending MD).  -Admit to [facility]  -Lasix IV 40 mg twice daily  -Labs: CMET, CBC, BNP, chest x-ray  -A.m. labs: BMET  2. PAF (paroxysmal atrial fibrillation) ([place]) He had PVI ablation in 2017 and has had recurrent AFib on Amiodarone. He converted back to normal sinus rhythm last month on higher dose Amiodarone (200 mg once daily).  He reduced his dose back to 100 mg once daily about 2 weeks ago.  His INR has been > 2 for several mos.    -Increase amiodarone to 400 mg twice daily  -Plan cardioversion 05/04/2019  -Manage INR per pharmacy  -Plan remaining  on higher dose amiodarone x several mos before reducing again   3. Coronary artery disease involving native coronary artery of  native heart without angina pectoris History of CABG in 2000.  Heart catheterization 2009 with patent bypass grafts.  He denies anginal symptoms.  He is not on aspirin as he is tolerating warfarin.  Continue statin therapy.  4. Biventricular ICD (implantable cardioverter-defibrillator) in place His defibrillator was turned off about a year ago.  5. Type 2 diabetes mellitus with complication (HCC) Continue current regimen.  Cover with sliding scale.  Code status:  He previously was a DNR but has requested full code today.  Dispo:  Return for [facility] Follow Up.   Medication Adjustments/Labs and Tests Ordered: Current medicines are reviewed at length with the patient today.  Concerns regarding medicines are outlined above.  Tests Ordered: Orders Placed This Encounter  Procedures  . EKG 12-Lead   Medication Changes: No orders of the defined types were placed in this encounter.   Severity of Illness: The appropriate patient status for this patient is INPATIENT. Inpatient status is judged to be reasonable and necessary in order to provide the required intensity of service to ensure the patient's safety. The patient's presenting symptoms, physical exam findings, and initial radiographic and laboratory data in the context of their chronic comorbidities is felt to place them at high risk for further clinical deterioration. Furthermore, it is not anticipated that the patient will be medically stable for discharge from the hospital within 2 midnights of admission. The following factors support the patient status of inpatient.   " The patient's presenting symptoms include shortness of breath, edema, orthopnea, paroxysmal nocturnal dyspnea. " The worrisome physical exam findings include leg edema, rales at bases. " The initial radiographic and laboratory data are  worrisome because of ECG with AFib. " The chronic co-morbidities include CHF, CAD, AFib, COPD, Diabetes, CKD.   * I certify that at the point of admission it is my clinical judgment that the patient will require inpatient hospital care spanning beyond 2 midnights from the point of admission due to high intensity of service, high risk for further deterioration and high frequency of surveillance required.*    For questions or updates, please contact [place] Please consult [email or web address] for contact info under   Signed, Richardson Dopp, PA-C  05/02/2019 4:00 PM

## 2019-05-02 NOTE — Plan of Care (Signed)
  Problem: Education: Goal: Knowledge of General Education information will improve Description: Including pain rating scale, medication(s)/side effects and non-pharmacologic comfort measures Outcome: Progressing   Problem: Health Behavior/Discharge Planning: Goal: Ability to manage health-related needs will improve Outcome: Progressing   Problem: Activity: Goal: Risk for activity intolerance will decrease Outcome: Progressing   

## 2019-05-02 NOTE — Patient Instructions (Addendum)
You have been reccommended to go to [facility] for further evaluation

## 2019-05-02 NOTE — Progress Notes (Signed)
Cardiology Office Note:    Date:  05/02/2019   ID:  [name], DOB June 30, 1934, MRN [id number]  PCP:  [name], MD  Cardiologist:  Larae Grooms, MD   Electrophysiologist:  Thompson Grayer, MD   Referring MD: [name], MD   Chief Complaint  Patient presents with  . Shortness of Breath    History of Present Illness:    [name] is a 85 y.o. male with:  Coronary artery disease   S/p CABG 2000  LHC 2009: patent bypass grafts  Myoview in 2011: scar but no ischemia  Chronic systolic CHF (EF 46-27)  Spironolactone DC'd in past due to low BP  Ischemic CM  S/p CRT-D  Persistent AFib  S/p PVI ablation 2017  Amiodarone Rx  CHADS2-VASc=8 (age x 2, CVA, HTN, CAD, CHF, Diabetes)  Hypertension  Hyperlipidemia  Hx of CVA  Diabetes mellitus  COPD  Chronic kidney disease  Hypothyroidism  PAD - L subclavian stenosis  OSA on CPAP   Mr. Pound was evaluated in the AFib Clinic last month due to recurrent AFib noted on his device.  His Amiodarone was increased with restoration of normal sinus rhythm.  He then called in with shortness of breath and weight gain.  His thoracic impedance was decreased indicating fluid overload.  His device demonstrated recurrent AFib.  [name], NP with the AFib clinic increased his Amiodarone again.  He now presents for further evaluation and management.  He is here today with his wife.  He started feeling poorly last Thursday.  He has been short of breath with minimal activity.  This has progressively worsened over that time.  He has to sleep on an incline and does have PND.  His legs have been more swollen.  He has not had chest discomfort.  He has not had syncope.  He is short of breath at rest at times.   Prior CV studies:   The following studies were reviewed today:  Echocardiogram 09/19/2018 EF 30-35, inferolateral, inferior hypokinesis to akinesis, lateral hypokinesis, apical septal/apical  anterior/apical akinesis, aortic root 39 mm, mild MR, severe LAE, mild RAE, trivial PI, PASP 45  Carotid US 09/28/2014 Bilateral ICA 1-39; left subclavian steal  Myoview 03/19/2010 EF 28, inf, inf-lat scar, no ischemia; Low Risk  Cardiac Catheterization 08/2008 LM 100 LAD patent; Dx 80 OM patent RCA patent L-LAD patent S-OM patent S-Dx patent  EF 20-25  Past Medical History:  Diagnosis Date  . Atrial fibrillation ([place])    persistent, previously seen at [facility] and placed on amiodarone  . Benign prostatic hypertrophy   . CAD (coronary artery disease)    multivessel s/p inferolateral wall MI with subsequent CABG 11/1998.  Cath 2009 with Patent grafts  . Cleft palate   . COPD with emphysema ([place]) 04/01/2010  . DM (diabetes mellitus), type 2 ([facility])   . Dyspnea   . GERD (gastroesophageal reflux disease)   . HTN (hypertension)   . Hyperlipidemia   . Hypothyroidism   . Iron deficiency anemia   . Ischemic dilated cardiomyopathy ([place])    EF 35-40% by MUGA 6/11  . Myocardial infarction ([place])   . Nasal septal deviation   . Nephrolithiasis   . OSA (obstructive sleep apnea)   . PAF (paroxysmal atrial fibrillation) ([place])   . Peripheral arterial disease (HCC)    left subclavian artery stenosis  . PNA (pneumonia)   . Psoriasis   . Seborrheic keratosis   . Stroke ([place])   . Systolic  congestive heart failure ([place]) 2009   s/p BiV ICD implantation by Dr Leonia Reeves (MDT)   Surgical Hx: The patient  has a past surgical history that includes Carpal tunnel release; left cleft palate and left cleft lip repair; Cataract extraction; c-spine surgery; Bi-ventricular implantable cardioverter defibrillator  (crt-d) (10-08-08; 11-06-2013); Coronary artery bypass graft; Biv icd genertaor change out (N/A, 11/06/2013); Cardioversion (N/A, 04/29/2016); and Cardiac catheterization (N/A, 06/11/2016).   Current Medications: Current Meds  Medication Sig  . acetaminophen (TYLENOL) 500 MG tablet Take 1,000 mg by mouth  at bedtime as needed for mild pain or moderate pain.  [name] albuterol (PROVENTIL) (2.5 MG/3ML) 0.083% nebulizer solution USE 1 VIAL VIA NEBULIZER 3 TIMES A DAY AS DIRECTED. DX:J44.9 (Patient taking differently: Take 2.5 mg by nebulization 3 (three) times daily. )  . amiodarone (PACERONE) 200 MG tablet Take 200 mg by mouth daily.   [name] atorvastatin (LIPITOR) 80 MG tablet TAKE 1 TABLET (80 MG TOTAL) BY MOUTH DAILY.  . carvedilol (COREG) 6.25 MG tablet Take 0.5 tablets (3.125 mg total) by mouth 2 (two) times daily. Take if systolic blood pressure >518 (Patient taking differently: Take 3.125 mg by mouth See admin instructions. Take if systolic blood pressure >841 take 3.125 mg and if below he does not take any thing)  . cholecalciferol (VITAMIN D) 1000 units tablet Take 2,000 Units by mouth every evening.  [name] dextromethorphan-guaiFENesin (MUCINEX DM) 30-600 MG 12hr tablet Take 1 tablet by mouth 2 (two) times daily as needed for cough.  . feeding supplement, ENSURE ENLIVE, (ENSURE ENLIVE) LIQD Take 237 mLs by mouth 2 (two) times daily between meals.  . ferrous sulfate 325 (65 FE) MG tablet Take 325 mg by mouth every evening.   . fluticasone (FLONASE) 50 MCG/ACT nasal spray SPRAY 2 SPRAYS INTO EACH NOSTRIL EVERY DAY (Patient taking differently: Place 2 sprays into both nostrils every morning. )  . furosemide (LASIX) 40 MG tablet Take 1 tablet (40 mg total) by mouth daily.  . Insulin Degludec-Liraglutide (XULTOPHY) 100-3.6 UNIT-MG/ML SOPN Inject 14 Units into the skin every morning.   [name] levothyroxine (SYNTHROID, LEVOTHROID) 100 MCG tablet Take 100 mcg by mouth daily before breakfast.   . Multiple Vitamins-Minerals (PRESERVISION AREDS PO) Take 1 tablet by mouth 2 (two) times daily.  . potassium chloride 20 MEQ TBCR Take 20 mEq by mouth daily.  [name] PRESCRIPTION MEDICATION Inhale into the lungs at bedtime. CPAP  . senna (SENOKOT) 8.6 MG tablet Take 1 tablet by mouth 2 (two) times daily.   . traMADol (ULTRAM) 50 MG  tablet Take 50 mg by mouth at bedtime as needed for moderate pain or severe pain.   . TRELEGY ELLIPTA 100-62.5-25 MCG/INH AEPB INHALE 1 PUFF INTO LUNGS ONCE A DAY (Patient taking differently: Take 1 puff by mouth every morning. )  . vitamin B-12 (CYANOCOBALAMIN) 1000 MCG tablet Take 1,000 mcg by mouth daily.  [name] warfarin (COUMADIN) 5 MG tablet Take as directed by Coumadin Clinic     Allergies:   Aldactone [spironolactone]   Social History   Tobacco Use  . Smoking status: Former Smoker    Packs/day: 3.00    Years: 65.00    Pack years: 195.00    Types: Cigarettes    Quit date: 10/25/1998    Years since quitting: 20.5  . Smokeless tobacco: Never Used  Substance Use Topics  . Alcohol use: No    Alcohol/week: 0.0 standard drinks    Comment: remote history of heavy alcohol use  . Drug use:  No     Family Hx: The patient's family history includes Asthma in his father; Heart attack in his brother; Heart disease in his brother; Hypertension in his brother, father, mother, and sister; Stroke in his father.  ROS:   Please see the history of present illness.    ROS All other systems reviewed and are negative.   EKGs/Labs/Other Test Reviewed:    EKG:  EKG is  ordered today.  The ekg ordered today demonstrates V paced HR 92, 3 beats NSVT  Recent Labs: 07/13/2018: TSH 0.454 09/20/2018: Magnesium 2.1 01/08/2019: ALT 16 01/09/2019: B Natriuretic Peptide 569.8 01/10/2019: Hemoglobin 11.0; Platelets 218 01/13/2019: BUN 22; Creatinine, Ser 1.11; Potassium 4.2; Sodium 138   Recent Lipid Panel Lab Results  Component Value Date/Time   CHOL 137 11/12/2014 09:21 AM   TRIG 95 11/12/2014 09:21 AM   HDL 50 11/12/2014 09:21 AM   CHOLHDL 2.6 11/29/2008 03:30 AM   LDLCALC 68 11/12/2014 09:21 AM    Physical Exam:    VS:  BP 120/82   Pulse 88   Ht 5\' 6"  (1.676 m)   Wt 195 lb (88.5 kg)   SpO2 93%   BMI 31.47 kg/m     Wt Readings from Last 3 Encounters:  05/02/19 195 lb (88.5 kg)  04/20/19  193 lb 9.6 oz (87.8 kg)  01/22/19 187 lb (84.8 kg)     Physical Exam  Constitutional: He is oriented to person, place, and time. He appears well-developed and well-nourished. No distress.  HENT:  Head: Normocephalic and atraumatic.  Eyes: No scleral icterus.  Neck: No JVD (I cannot appreciate JVD) present. No thyromegaly present.  Cardiovascular: Normal rate, regular rhythm and normal heart sounds.  No murmur heard. Pulmonary/Chest: Effort normal. He has no wheezes. He has rales in the right lower field and the left lower field.  Abdominal: Soft. He exhibits no distension. There is no hepatomegaly.  Musculoskeletal:        General: Edema (trace-1+ bilat LE edema) present.  Lymphadenopathy:    He has no cervical adenopathy.  Neurological: He is alert and oriented to person, place, and time.  Skin: Skin is warm and dry.  Psychiatric: He has a normal mood and affect.    ASSESSMENT & PLAN:    1. Acute on chronic systolic congestive heart failure (HCC) EF 30-35.  NYHA 3-4.  He has developed progressively worsening shortness of breath over the past week in the setting of recurrent atrial fibrillation.  He has symptoms at rest at times.  It is difficult for him to lay back to do the EKG today.  I have recommended admission to the hospital for diuresis and subsequent cardioversion.  I reviewed this with Dr. Caryl Comes (attending MD).  -Admit to [facility]  -Lasix IV 40 mg twice daily  -Labs: CMET, CBC, BNP, chest x-ray  -A.m. labs: BMET  2. PAF (paroxysmal atrial fibrillation) ([place]) He had PVI ablation in 2017 and has had recurrent AFib on Amiodarone. He converted back to normal sinus rhythm last month on higher dose Amiodarone (200 mg once daily).  He reduced his dose back to 100 mg once daily about 2 weeks ago.  His INR has been > 2 for several mos.    -Increase amiodarone to 400 mg twice daily  -Plan cardioversion 05/04/2019  -Manage INR per pharmacy  -Plan remaining on higher dose  amiodarone x several mos before reducing again   3. Coronary artery disease involving native coronary artery of native  heart without angina pectoris History of CABG in 2000.  Heart catheterization 2009 with patent bypass grafts.  He denies anginal symptoms.  He is not on aspirin as he is tolerating warfarin.  Continue statin therapy.  4. Biventricular ICD (implantable cardioverter-defibrillator) in place His defibrillator was turned off about a year ago.  5. Type 2 diabetes mellitus with complication (HCC) Continue current regimen.  Cover with sliding scale.   Dispo:  Return for [facility] Follow Up.   Medication Adjustments/Labs and Tests Ordered: Current medicines are reviewed at length with the patient today.  Concerns regarding medicines are outlined above.  Tests Ordered: Orders Placed This Encounter  Procedures  . EKG 12-Lead   Medication Changes: No orders of the defined types were placed in this encounter.   Signed, Richardson Dopp, PA-C  05/02/2019 5:13 PM    [facility] [address], [place], [place]  [address] Phone: [phone]; Fax: [phone]

## 2019-05-03 ENCOUNTER — Inpatient Hospital Stay (HOSPITAL_COMMUNITY): Payer: HMO

## 2019-05-03 ENCOUNTER — Encounter (HOSPITAL_COMMUNITY): Payer: Self-pay | Admitting: General Practice

## 2019-05-03 ENCOUNTER — Other Ambulatory Visit: Payer: Self-pay

## 2019-05-03 DIAGNOSIS — N183 Chronic kidney disease, stage 3 (moderate): Secondary | ICD-10-CM | POA: Diagnosis not present

## 2019-05-03 DIAGNOSIS — T82110S Breakdown (mechanical) of cardiac electrode, sequela: Secondary | ICD-10-CM

## 2019-05-03 DIAGNOSIS — E1142 Type 2 diabetes mellitus with diabetic polyneuropathy: Secondary | ICD-10-CM | POA: Diagnosis not present

## 2019-05-03 DIAGNOSIS — E039 Hypothyroidism, unspecified: Secondary | ICD-10-CM | POA: Diagnosis not present

## 2019-05-03 DIAGNOSIS — I4819 Other persistent atrial fibrillation: Secondary | ICD-10-CM

## 2019-05-03 DIAGNOSIS — N400 Enlarged prostate without lower urinary tract symptoms: Secondary | ICD-10-CM | POA: Diagnosis not present

## 2019-05-03 DIAGNOSIS — I480 Paroxysmal atrial fibrillation: Secondary | ICD-10-CM | POA: Diagnosis not present

## 2019-05-03 DIAGNOSIS — E1122 Type 2 diabetes mellitus with diabetic chronic kidney disease: Secondary | ICD-10-CM | POA: Diagnosis not present

## 2019-05-03 DIAGNOSIS — J449 Chronic obstructive pulmonary disease, unspecified: Secondary | ICD-10-CM | POA: Diagnosis not present

## 2019-05-03 DIAGNOSIS — I5023 Acute on chronic systolic (congestive) heart failure: Secondary | ICD-10-CM

## 2019-05-03 DIAGNOSIS — E1151 Type 2 diabetes mellitus with diabetic peripheral angiopathy without gangrene: Secondary | ICD-10-CM | POA: Diagnosis not present

## 2019-05-03 DIAGNOSIS — I129 Hypertensive chronic kidney disease with stage 1 through stage 4 chronic kidney disease, or unspecified chronic kidney disease: Secondary | ICD-10-CM | POA: Diagnosis not present

## 2019-05-03 DIAGNOSIS — E109 Type 1 diabetes mellitus without complications: Secondary | ICD-10-CM | POA: Diagnosis not present

## 2019-05-03 DIAGNOSIS — I25118 Atherosclerotic heart disease of native coronary artery with other forms of angina pectoris: Secondary | ICD-10-CM | POA: Diagnosis not present

## 2019-05-03 DIAGNOSIS — I5022 Chronic systolic (congestive) heart failure: Secondary | ICD-10-CM | POA: Diagnosis not present

## 2019-05-03 LAB — BASIC METABOLIC PANEL
Anion gap: 10 (ref 5–15)
BUN: 15 mg/dL (ref 8–23)
CO2: 26 mmol/L (ref 22–32)
Calcium: 9 mg/dL (ref 8.9–10.3)
Chloride: 104 mmol/L (ref 98–111)
Creatinine, Ser: 1.26 mg/dL — ABNORMAL HIGH (ref 0.61–1.24)
GFR calc Af Amer: >60 mL/min (ref >60)
GFR calc non Af Amer: 52 mL/min — ABNORMAL LOW (ref >60)
Glucose, Bld: 92 mg/dL (ref 70–99)
Potassium: 3.9 mmol/L (ref 3.5–5.1)
Sodium: 140 mmol/L (ref 135–145)

## 2019-05-03 LAB — CBC
HCT: 39.6 % (ref 39.0–52.0)
Hemoglobin: 12.9 g/dL — ABNORMAL LOW (ref 13.0–17.0)
MCH: 30.1 pg (ref 26.0–34.0)
MCHC: 32.6 g/dL (ref 30.0–36.0)
MCV: 92.5 fL (ref 80.0–100.0)
Platelets: 194 10*3/uL (ref 150–400)
RBC: 4.28 MIL/uL (ref 4.22–5.81)
RDW: 14.8 % (ref 11.5–15.5)
WBC: 7.5 10*3/uL (ref 4.0–10.5)
nRBC: 0 % (ref 0.0–0.2)

## 2019-05-03 LAB — PROTIME-INR
INR: 2.7 — ABNORMAL HIGH (ref 0.8–1.2)
Prothrombin Time: 27.9 seconds — ABNORMAL HIGH (ref 11.4–15.2)

## 2019-05-03 LAB — GLUCOSE, CAPILLARY
Glucose-Capillary: 91 mg/dL (ref 70–99)
Glucose-Capillary: 146 mg/dL — ABNORMAL HIGH (ref 70–99)
Glucose-Capillary: 92 mg/dL (ref 70–99)
Glucose-Capillary: 180 mg/dL — ABNORMAL HIGH (ref 70–99)

## 2019-05-03 LAB — TSH: TSH: 2.004 u[IU]/mL (ref 0.350–4.500)

## 2019-05-03 MED ORDER — WARFARIN SODIUM 5 MG PO TABS
5.0000 mg | ORAL_TABLET | Freq: Once | ORAL | Status: AC
  Administered 2019-05-03: 5 mg via ORAL
  Filled 2019-05-03: qty 1

## 2019-05-03 MED ORDER — INSULIN DEGLUDEC-LIRAGLUTIDE 100-3.6 UNIT-MG/ML ~~LOC~~ SOPN
14.0000 [IU] | PEN_INJECTOR | Freq: Every morning | SUBCUTANEOUS | Status: DC
  Administered 2019-05-03 – 2019-05-05 (×3): 14 [IU] via SUBCUTANEOUS

## 2019-05-03 MED ORDER — PROSIGHT PO TABS
1.0000 | ORAL_TABLET | Freq: Two times a day (BID) | ORAL | Status: DC
  Administered 2019-05-03 – 2019-05-05 (×5): 1 via ORAL
  Filled 2019-05-03 (×5): qty 1

## 2019-05-03 NOTE — Consult Note (Addendum)
Name: RACHEL SAMPLES MRN: [id number] DOB: [birth year]    ADMISSION DATE:  05/02/2019 CONSULTATION DATE:  05/03/2019  REFERRING MD :  Dr. Lynelle Doctor   CHIEF COMPLAINT:  Dyspnea   HISTORY OF PRESENT ILLNESS:  85 year old male presents to Cardiology Office on 7/8 with increased shortness of breath with minimal activity in which has progressively worsened since 7/2. CXR with pulmonary vascular congestion and small bibasilar pleural effusion with right greater then left. Admitted by Cardiology. Has diuresed 1L over last 24 hours. PCCM consulted for pulmonary consult.   Patient denies wheeze, productive cough. States he has chronic cough. Does not use oxygen at home. Uses CPAP every night, along with scheduled inhalers. Last pulmonary appt 03/2018.    PMH of CAD s/p CABG 2542, Chronic Systolic HF with EF 70-62%, A.Fib s/p ablation 2017, no on amiodarone, HTN, HLD, CVA, COPD, PAD with Left subclavian stenosis, OSA on CPAP at HS, Chronic Kidney Disease    SIGNIFICANT EVENTS  7/8 > Presents to [facility]  7/9 > PCCM Consulted   STUDIES:  CXR 7/9 > Enlargement of cardiac silhouette post CABG and AICD with pulmonary vascular congestion. Small bibasilar pleural effusions and atelectasis greater on RIGHT. No definite acute infiltrate.  PAST MEDICAL HISTORY :   has a past medical history of Atrial fibrillation ([place]), Benign prostatic hypertrophy, CAD (coronary artery disease), Cleft palate, COPD with emphysema ([place]) (04/01/2010), DM (diabetes mellitus), type 2 ([place]), Dyspnea, GERD (gastroesophageal reflux disease), HTN (hypertension), Hyperlipidemia, Hypothyroidism, Iron deficiency anemia, Ischemic dilated cardiomyopathy ([place]), Myocardial infarction ([place]), Nasal septal deviation, Nephrolithiasis, OSA (obstructive sleep apnea), PAF (paroxysmal atrial fibrillation) ([place]), Peripheral arterial disease ([place]), PNA (pneumonia), Psoriasis, Seborrheic keratosis, Stroke ([place]), and Systolic congestive heart failure ([place])  (2009).  has a past surgical history that includes Carpal tunnel release; left cleft palate and left cleft lip repair; Cataract extraction; c-spine surgery; Bi-ventricular implantable cardioverter defibrillator  (crt-d) (10-08-08; 11-06-2013); Coronary artery bypass graft; Biv icd genertaor change out (N/A, 11/06/2013); Cardioversion (N/A, 04/29/2016); and Cardiac catheterization (N/A, 06/11/2016). Prior to Admission medications   Medication Sig Start Date End Date Taking? Authorizing Provider  acetaminophen (TYLENOL) 500 MG tablet Take 1,000 mg by mouth at bedtime as needed for mild pain or moderate pain.   Yes [provider]  albuterol (PROVENTIL) (2.5 MG/3ML) 0.083% nebulizer solution USE 1 VIAL VIA NEBULIZER 3 TIMES A DAY AS DIRECTED. DX:J44.9 Patient taking differently: Take 2.5 mg by nebulization See admin instructions. Nebulize 2.5 mg (1 vial) one to three times a day 08/21/18  Yes Chesley Mires, MD  amiodarone (PACERONE) 200 MG tablet Take 100 mg by mouth 2 (two) times daily.    Yes [provider]  atorvastatin (LIPITOR) 80 MG tablet TAKE 1 TABLET (80 MG TOTAL) BY MOUTH DAILY. Patient taking differently: Take 80 mg by mouth daily.  07/10/18  Yes Jettie Booze, MD  carvedilol (COREG) 6.25 MG tablet Take 0.5 tablets (3.125 mg total) by mouth 2 (two) times daily. Take if systolic blood pressure >376 Patient taking differently: Take 3.125 mg by mouth See admin instructions. Take 3.125 mg by mouth two times a day IF Systolic B/P is >283 and if <110, do not take 10/02/18  Yes Jettie Booze, MD  cholecalciferol (VITAMIN D) 1000 units tablet Take 2,000 Units by mouth every evening.   Yes [provider]  dextromethorphan-guaiFENesin (MUCINEX DM) 30-600 MG 12hr tablet Take 1 tablet by mouth 2 (two) times daily as needed for cough.   Yes [provider]  feeding supplement, ENSURE ENLIVE, (ENSURE ENLIVE) LIQD Take 237 mLs by mouth 2 (two) times daily between  meals. 10/07/18  Yes Ghimire, Henreitta Leber, MD  ferrous sulfate 325 (65 FE) MG tablet Take 325 mg by mouth every evening.    Yes [provider]  fluticasone (FLONASE) 50 MCG/ACT nasal spray SPRAY 2 SPRAYS INTO EACH NOSTRIL EVERY DAY Patient taking differently: Place 2 sprays into both nostrils every morning.  08/16/18  Yes Chesley Mires, MD  furosemide (LASIX) 40 MG tablet Take 1 tablet (40 mg total) by mouth daily. 01/14/19  Yes Oswald Hillock, MD  Insulin Degludec-Liraglutide (XULTOPHY) 100-3.6 UNIT-MG/ML SOPN Inject 14 Units into the skin every morning.    Yes [provider]  levothyroxine (SYNTHROID, LEVOTHROID) 100 MCG tablet Take 100 mcg by mouth daily before breakfast.  09/23/15  Yes [provider]  Multiple Vitamins-Minerals (PRESERVISION AREDS PO) Take 1 capsule by mouth 2 (two) times daily.    Yes [provider]  potassium chloride 20 MEQ TBCR Take 20 mEq by mouth daily. 07/15/18  Yes Thurnell Lose, MD  PRESCRIPTION MEDICATION See admin instructions. CPAP- At bedtime   Yes [provider]  senna (SENOKOT) 8.6 MG tablet Take 1 tablet by mouth 2 (two) times daily. HOLD FOR LOOSE STOOLS   Yes [provider]  traMADol (ULTRAM) 50 MG tablet Take 50 mg by mouth at bedtime.  06/07/18  Yes [provider]  TRELEGY ELLIPTA 100-62.5-25 MCG/INH AEPB INHALE 1 PUFF INTO LUNGS ONCE A DAY Patient taking differently: Take 1 puff by mouth every morning.  07/06/18  Yes Chesley Mires, MD  vitamin B-12 (CYANOCOBALAMIN) 1000 MCG tablet Take 1,000 mcg by mouth daily.   Yes [provider]  warfarin (COUMADIN) 5 MG tablet Take as directed by Coumadin Clinic Patient taking differently: Take 5 mg by mouth daily after supper.  03/22/19  Yes Jettie Booze, MD   Allergies  Allergen Reactions   Adhesive [Tape] Other (See Comments)    PATIENT'S SKIN TEARS AND BRUISES VERY EASILY- IS TAKING COUMADIN!!   Aldactone [Spironolactone] Other  (See Comments)    Hyperkalemia  reported by Dr. [name] - pt is currently taking 12.5 mg daily -November 2017 per medication list by same MD    FAMILY HISTORY:  family history includes Asthma in his father; Heart attack in his brother; Heart disease in his brother; Hypertension in his brother, father, mother, and sister; Stroke in his father. SOCIAL HISTORY:  reports that he quit smoking about 20 years ago. His smoking use included cigarettes. He has a 195.00 pack-year smoking history. He has never used smokeless tobacco. He reports that he does not drink alcohol or use drugs.  REVIEW OF SYSTEMS:   Constitutional: Negative for fever, chills, weight loss, malaise/fatigue and diaphoresis.  HENT: Negative for hearing loss, ear pain, nosebleeds, congestion, sore throat, neck pain, tinnitus and ear discharge.   Eyes: Negative for blurred vision, double vision, photophobia, pain, discharge and redness.  Respiratory: Negative for cough, hemoptysis, sputum production, shortness of breath, wheezing and stridor.   Cardiovascular: Negative for chest pain, palpitations, orthopnea, claudication, leg swelling and PND.  Gastrointestinal: Negative for heartburn, nausea, vomiting, abdominal pain, diarrhea, constipation, blood in stool and melena.  Genitourinary: Negative for dysuria, urgency, frequency, hematuria and flank pain.  Musculoskeletal: Negative for myalgias, back pain, joint pain and falls.  Skin: Negative for itching and rash.  Neurological: Negative for dizziness, tingling, tremors, sensory change, speech change,  focal weakness, seizures, loss of consciousness, weakness and headaches.  Endo/Heme/Allergies: Negative for environmental allergies and polydipsia. Does not bruise/bleed easily.  SUBJECTIVE:   VITAL SIGNS: Temp:  [97.6 F (36.4 C)-98.3 F (36.8 C)] 98.3 F (36.8 C) (07/09 0843) Pulse Rate:  [74-104] 90 (07/09 0843) Resp:  [18-20] 18 (07/09 0843) BP: (110-146)/(69-99) 116/69  (07/09 0843) SpO2:  [92 %-99 %] 95 % (07/09 0843) Weight:  [85.9 kg-86.8 kg] 85.9 kg (07/09 0733)  PHYSICAL EXAMINATION: General:  Elderly male, sitting in chair, no distress  Neuro:  Alert, oriented, follows commands  HEENT:  MMM Cardiovascular:  RRR,no MRG  Lungs:  Crackles to bases, no wheeze Abdomen:  Soft, non-tender, active bowel sounds  Musculoskeletal:  +1 BLE edema  Skin:  Intact   Recent Labs  Lab 05/02/19 1813 05/03/19 0503  NA 139 140  K 4.5 3.9  CL 102 104  CO2 28 26  BUN 17 15  CREATININE 1.25* 1.26*  GLUCOSE 136* 92   Recent Labs  Lab 05/02/19 1813 05/03/19 0503  HGB 13.2 12.9*  HCT 40.9 39.6  WBC 8.3 7.5  PLT 218 194   Dg Chest 2 View  Result Date: 05/03/2019 CLINICAL DATA:  Shortness of breath for 3 days, CHF, COPD, atrial fibrillation, coronary artery disease post MI and CABG, type II diabetes mellitus, hypertension EXAM: CHEST - 2 VIEW COMPARISON:  01/13/2019 FINDINGS: LEFT subclavian AICD with leads projecting at RIGHT atrium, RIGHT ventricle, and coronary sinus. Enlargement of cardiac silhouette post CABG. Atherosclerotic calcification aorta. Pulmonary vascular congestion without gross pulmonary edema. Mild central peribronchial thickening. Persistent bibasilar effusions and atelectasis greater on RIGHT. No pneumothorax. Bones demineralized. IMPRESSION: Enlargement of cardiac silhouette post CABG and AICD with pulmonary vascular congestion. Small bibasilar pleural effusions and atelectasis greater on RIGHT. No definite acute infiltrate. Electronically Signed   By: Lavonia Dana M.D.   On: 05/03/2019 07:59   PFT 12/19/2013:   FVC-Pre L 2.82    FVC-%Pred-Pre % 76    FVC-Post L 2.86    FVC-%Pred-Post % 77    FVC-%Change-Post % 1    FEV1-Pre L 2.03    FEV1-%Pred-Pre % 77    FEV1-Post L 1.93    FEV1-%Pred-Post % 73    FEV1-%Change-Post % -5    FEV6-Pre L 2.79    FEV6-%Pred-Pre % 81    FEV6-Post L 2.83    FEV6-%Pred-Post % 82    FEV6-%Change-Post % 1     Pre FEV1/FVC ratio % 72    FEV1FVC-%Pred-Pre % 99    Post FEV1/FVC ratio % 67    FEV1FVC-%Change-Post % -6    Pre FEV6/FVC Ratio % 99    FEV6FVC-%Pred-Pre % 106    Post FEV6/FVC ratio % 99    FEV6FVC-%Pred-Post % 106    FEV6FVC-%Change-Post % 0    FEF 25-75 Pre L/sec 1.28    FEF2575-%Pred-Pre % 69    FEF 25-75 Post L/sec 1.00    FEF2575-%Pred-Post % 54    FEF2575-%Change-Post % -21    RV L 3.47    RV % pred % 139    TLC L 6.45   R   TLC % pred % 98    DLCO unc ml/min/mmHg 14.25    DLCO unc % pred % 49    DLCO cor ml/min/mmHg 14.25    DLCO cor % pred % 49    DL/VA ml/min/mmHg/L 2.93    DL/VA % pred % 66      ASSESSMENT / PLAN:  COPD/Emphysema with Chronic Cough  -Previous PFT 11/2013: Results Above  -CT Chest 08/2018 with Irregular 1.1 x 1.3 x 1.9 cm LEFT UPPER lobe nodule worrisome for malignancy Plan -Followed by Dr. Halford Chessman >> Last Appt 04/2018  -Continue Breo Ellipta, Incruse Ellipta, PRN Albuterol  -Continue Flonase   -Mucinex PRN for phlegm/cough  -Will need outpatient work-up for pulmonary nodule >> will set up pulmonary outpatient appointment >> 7/22 3:00   Sleep Apnea on CPAP at HS  -Sleep Titration Study 06/23/2016 Plan -Orders placed for CPAP at HS   Hayden Pedro, AGACNP-BC [place] Pgr: [phone]

## 2019-05-03 NOTE — TOC Initial Note (Signed)
Transition of Care [facility]) - Initial/Assessment Note    Patient Details  Name: [name] MRN: [id number] Date of Birth: 04/21/1934  Transition of Care [facility]) CM/SW Contact:    Candie Chroman, LCSW Phone Number: 05/03/2019, 10:14 AM  Clinical Narrative: Readmission prevention screen complete. Patient's PCP is Dr. [name]. He said [facility] pharmacy was [facility] but in the past 3-4 months it has changed and he was unsure the name. Chart says [facility]. He and his wife drive to appointments. He does not have any home health services. He mainly uses a walker at home but also has a cane and uses his golf cart when out in the yard. His bed is automatic like the hospital beds. He says his wife wants him to get a lift chair but he does not think he needs that yet. No further concerns. CSW encouraged patient to contact CSW as needed. CSW will continue to follow patient and facilitate return home once stable.         Expected Discharge Plan: Home/Self Care Barriers to Discharge: Continued Medical Work up   Patient Goals and CMS Choice        Expected Discharge Plan and Services Expected Discharge Plan: Home/Self Care       Living arrangements for the past 2 months: Single Family Home                                      Prior Living Arrangements/Services Living arrangements for the past 2 months: Single Family Home Lives with:: Spouse Patient language and need for interpreter reviewed:: Yes(No needs.) Do you feel safe going back to the place where you live?: Yes      Need for Family Participation in Patient Care: Yes (Comment) Care giver support system in place?: Yes (comment) Current home services: DME Criminal Activity/Legal Involvement Pertinent to Current Situation/Hospitalization: No - Comment as needed  Activities of Daily Living Home Assistive Devices/Equipment: Eyeglasses, Dentures (specify type), Cane (specify quad or straight), CBG Meter ADL Screening  (condition at time of admission) Patient's cognitive ability adequate to safely complete daily activities?: Yes Is the patient deaf or have difficulty hearing?: Yes Does the patient have difficulty seeing, even when wearing glasses/contacts?: No Does the patient have difficulty concentrating, remembering, or making decisions?: Yes Patient able to express need for assistance with ADLs?: Yes Does the patient have difficulty dressing or bathing?: No Independently performs ADLs?: Yes (appropriate for developmental age) Does the patient have difficulty walking or climbing stairs?: Yes Weakness of Legs: Both Weakness of Arms/Hands: Both  Permission Sought/Granted                  Emotional Assessment Appearance:: Appears stated age Attitude/Demeanor/Rapport: Engaged, Gracious Affect (typically observed): Accepting, Appropriate, Calm, Pleasant Orientation: : Oriented to Self, Oriented to Place, Oriented to  Time, Oriented to Situation Alcohol / Substance Use: Never Used Psych Involvement: No (comment)  Admission diagnosis:  CHF Patient Active Problem List   Diagnosis Date Noted  . ICD (implantable cardioverter-defibrillator) lead failure, sequela   . Acute on chronic systolic CHF (congestive heart failure), NYHA class 4 ([place]) 01/08/2019  . CKD stage 3 due to type 2 diabetes mellitus ([place]) 01/08/2019  . Dehydration 01/08/2019  . Leukocytosis 01/08/2019  . Acute on chronic respiratory failure with hypoxia ([place]) 10/04/2018  . HCAP (healthcare-associated pneumonia) 09/17/2018  . Severe sepsis with septic shock ([place]) 07/13/2018  .  Supratherapeutic INR 07/13/2018  . Hypoxia 07/13/2018  . Hypothyroidism 07/13/2018  . Pressure injury of skin 07/13/2018  . Community acquired pneumonia 09/03/2016  . Acute respiratory failure ([place]) 09/03/2016  . SOB (shortness of breath)   . A-fib ([place]) 06/11/2016  . Atrial fibrillation, persistent   . Allergic rhinitis 02/05/2016  . Long term  (current) use of anticoagulants [Z79.01] 01/19/2016  . Chronic systolic heart failure ([place])   . Acute respiratory failure with hypoxia ([place]) 01/11/2016  . Acute on chronic systolic heart failure ([place]) 01/11/2016  . Demand ischemia ([place]) 01/11/2016  . Acute renal failure superimposed on stage 3 chronic kidney disease ([place]) 01/11/2016  . Diabetes mellitus with diabetic neuropathy, with long-term current use of insulin ([place]) 01/11/2016  . Morbid obesity due to excess calories ([place]) 01/11/2016  . COPD (chronic obstructive pulmonary disease) ([place]) 01/10/2016  . Coronary artery disease   . Ischemic dilated cardiomyopathy ([place])   . PAF (paroxysmal atrial fibrillation) ([place])   . OSA (obstructive sleep apnea)   . Peripheral vascular disease ([place]) 10/02/2013  . Long term current use of anticoagulant therapy 08/21/2013  . Essential hypertension 04/01/2010  . Automatic implantable cardioverter-defibrillator in situ 03/31/2010   PCP:  [name], MD Pharmacy:   [place], [place] - [address] [place] [address] Phone: [phone] Fax: [phone]  [place], [place] [address] [employer] [place] [address] Phone: [phone] Fax: [phone]     Social Determinants of Health (SDOH) Interventions    Readmission Risk Interventions Readmission Risk Prevention Plan 05/03/2019 01/13/2019 01/12/2019  Transportation Screening Complete - Complete  HRI or Home Care Consult Complete - -  Social Work Consult for [place] Planning/Counseling Complete - -  Palliative Care Screening Not Applicable - -  Medication Review Press photographer) Complete Complete -  PCP or Specialist appointment within 3-5 days of discharge - Complete -  [place] or [place] - - Patient refused  SW Recovery Care/Counseling Consult - - Complete  [place] - - Not Applicable  Some recent data might be hidden

## 2019-05-03 NOTE — Patient Outreach (Signed)
  [facility]) Care Management Chronic Special Needs Program    05/03/2019  Name: [name], DOB: 1934-05-20  MRN: [id number]   Mr. Pawan Knechtel is enrolled in a chronic special needs plan for Heart Failure.  Client admitted to [facility] on 05/02/2019 for acute on chronic systolic heart failure.  Plan: individualized care plan sent to hospital utilization management department.  Thea Silversmith, RN, MSN, [place] [phone]

## 2019-05-03 NOTE — Progress Notes (Signed)
Patients left arm results in very low BP, patient states that arm has never worked and we should always use his right arm. Right arm BP WNL.

## 2019-05-03 NOTE — Progress Notes (Addendum)
Progress Note  Patient Name: [name] Date of Encounter: 05/03/2019  Primary Cardiologist: [name] Grooms, MD   Subjective   No significant overnight events. Patient a little improvement in his breathing but he still gets very noticeably short of breath with talking. Still had to sleep on an incline last night. He denies any chest pain. No palpitations, lightheadedness, or dizziness. He does report a productive cough which he states he always has due to emphysema.   Inpatient Medications    Scheduled Meds: . albuterol  2.5 mg Nebulization TID  . amiodarone  400 mg Oral BID  . atorvastatin  80 mg Oral q1800  . carvedilol  3.125 mg Oral BID WC  . cholecalciferol  2,000 Units Oral QPM  . feeding supplement (ENSURE ENLIVE)  237 mL Oral BID BM  . ferrous sulfate  325 mg Oral QPM  . fluticasone  2 spray Each Nare Daily  . fluticasone furoate-vilanterol  1 puff Inhalation Daily   And  . umeclidinium bromide  1 puff Inhalation Daily  . furosemide  40 mg Intravenous BID  . insulin aspart  0-15 Units Subcutaneous TID WC  . insulin aspart  0-5 Units Subcutaneous QHS  . Insulin Degludec-Liraglutide  14 Units Subcutaneous q morning - 10a  . levothyroxine  100 mcg Oral QAC breakfast  . multivitamin  1 tablet Oral BID  . potassium chloride  20 mEq Oral BID  . senna  1 tablet Oral BID  . sodium chloride flush  3 mL Intravenous Q12H  . vitamin B-12  1,000 mcg Oral Daily  . Warfarin - Pharmacist Dosing Inpatient   Does not apply q1800   Continuous Infusions: . sodium chloride     PRN Meds: sodium chloride, acetaminophen, calcium carbonate, dextromethorphan-guaiFENesin, ondansetron (ZOFRAN) IV, sodium chloride flush, traMADol   Vital Signs    Vitals:   05/02/19 2138 05/02/19 2209 05/03/19 0105 05/03/19 0451  BP:  118/70 125/74 110/76  Pulse: 74 85 87 93  Resp: 18  18 20   Temp:   98 F (36.7 C) 97.8 F (36.6 C)  TempSrc:   Oral Oral  SpO2: 99%  92% 94%  Weight:       Height:        Intake/Output Summary (Last 24 hours) at 05/03/2019 0727 Last data filed at 05/03/2019 0556 Gross per 24 hour  Intake 480 ml  Output 1125 ml  Net -645 ml   Last 3 Weights 05/02/2019 01/14/2019 01/13/2019  Weight (lbs) 191 lb 6.4 oz 190 lb 4.8 oz 184 lb 4.9 oz  Weight (kg) 86.818 kg 86.32 kg 83.6 kg  Some encounter information is confidential and restricted. Go to Review Flowsheets activity to see all data.      Telemetry    Ventricular paced rhythm with what looks like underlying sinus rhythm at this time (clear P waves and R to R interval regular). - Personally Reviewed  ECG    No new ECG tracing today. - Personally Reviewed  Physical Exam   GEN: Elderly Caucasian male resting comfortably in no acute distress.   Neck: Supple. No JVD appreciated. Cardiac: RRR. No murmurs, rubs, or gallops.  Respiratory: Noticeably short of breath while talking. Crackles noted in right lower lungs. No significant wheezes or rhonchi noted. GI: Soft, non-distended, and non-tender. Bowel sounds present.  MS: Trace lower extremity edema. No deformity. Skin: Warm and dry. Neuro: No focal deficits. Psych: Normal affect. Responds appropriately.   Labs    High Sensitivity Troponin:  No results for input(s): TROPONINIHS in the last 720 hours.    Cardiac EnzymesNo results for input(s): TROPONINI in the last 168 hours. No results for input(s): TROPIPOC in the last 168 hours.   Chemistry Recent Labs  Lab 05/02/19 1813 05/03/19 0503  NA 139 140  K 4.5 3.9  CL 102 104  CO2 28 26  GLUCOSE 136* 92  BUN 17 15  CREATININE 1.25* 1.26*  CALCIUM 9.2 9.0  PROT 7.0  --   ALBUMIN 3.4*  --   AST 20  --   ALT 14  --   ALKPHOS 81  --   BILITOT 0.7  --   GFRNONAA 53* 52*  GFRAA >60 >60  ANIONGAP 9 10     Hematology Recent Labs  Lab 05/02/19 1813 05/03/19 0503  WBC 8.3 7.5  RBC 4.34 4.28  HGB 13.2 12.9*  HCT 40.9 39.6  MCV 94.2 92.5  MCH 30.4 30.1  MCHC 32.3 32.6  RDW 14.8 14.8   PLT 218 194    BNP Recent Labs  Lab 05/02/19 1813  BNP 1,255.8*     DDimer No results for input(s): DDIMER in the last 168 hours.   Radiology    No results found.  Cardiac Studies   Echocardiogram 09/19/2018: Study Conclusions: - Left ventricle: The cavity size was severely dilated. Systolic   function was moderately to severely reduced. The estimated   ejection fraction was in the range of 30% to 35%. Severe diffuse   hypokinesis with distinct regional wall motion abnormalities.   There is severe hypokinesis to akinesis of the inferolateral and   inferior myocardium. There is hypokinesis of the lateral   myocardium. There is akinesis of the apical septal, apical   anterior and apical myocardium. - Aortic valve: Mildly to moderately calcified annulus. Trileaflet;   normal thickness, moderately calcified leaflets. Valve area   (VTI): 2.55 cm^2. Valve area (Vmax): 2.7 cm^2. Valve area   (Vmean): 2.13 cm^2. - Aorta: Aortic root dimension: 39 mm (ED). - Aortic root: The aortic root was mildly dilated. - Mitral valve: There was mild regurgitation. Valve area by   continuity equation (using LVOT flow): 2.29 cm^2. - Left atrium: The atrium was severely dilated. - Right atrium: The atrium was mildly dilated. - Pulmonic valve: There was trivial regurgitation. - Pulmonary arteries: PA peak pressure: 45 mm Hg (S).  Impressions: - The right ventricular systolic pressure was increased consistent   with moderate pulmonary hypertension.  Patient Profile   [name] is a 85 y.o. male with a history of CAD s/p CABG in 2000 with patent grafts on cardiac catheterization in 2009 and negative Myoview in 0814, chronic systolic CHF/ischemic cardiomyopathy with EF of 30-35%, persistent atrial fibrillation s/p PVI ablation in 2017 on Amiodarone and Coumadin, hypertension, hyperlipidemia, diabetes mellitus, CVA, COPD, hypothyroidism, PAD with left subclavian stenosis, obstructive sleep  apnea on CPAP, and CKD, who was admitted from our office on 05/02/2019 with acute on chronic systolic CHF.  Assessment & Plan    Acute on Chronic Systolic CHF - Patient presented to office yesterday and reported progressively worsening dyspnea over the past week in the setting of recurrent atrial fibrillation. Decision was made to admit him for IV diuresis and subsequent cardioversion. - Most recent Echo from 08/2018 showed 30-35% with multiple wall motion abnormalities. See full report above. - BNP elevated at 1,255.8. - Chest x-ray this morning showed pulmonary vascular congestion without gross pulmonary edema as well as small bibasilar pleural effusion  and atelectasis greater on right. - Currently on IV Lasix 40mg  twice daily. Documented urinary output of 1.2 L in the last 24 hours. Renal function stable. Weight down 2 lbs from yesterday. - Continue current dose of IV Lasix. - Patient does not appear to be on an ACE/ARB at home. Consider adding low dose ARB. - Continue Coreg 3.125mg  twice daily. - Continue to monitor daily weights, strict I/O's, and renal function.  Paroxysmal Atrial Fibrillation - Telemetry shows ventricular paced rhythm with what looks like underlying sinus rhythm.  - He has been taking Amiodarone 100mg  daily over the past 2 weeks. This was increased to 400mg  twice daily after office visit yesterday and since then he has converted to sinus rhythm. Continue current dose. - Continue Coerg 3.125mg  twice daily.  - Continue anticoagulation with Coumadin. INR 2.7 today.  CAD - History of CABG in 2000 with patent grafts on cardiac catheterization in 2009. Stable. Patient denies any angina. He is not on Aspirin due to Coumadin. Continue high intensity statin and beta-blocker.  Biventricular ICD in Place - Device was last checked on 04/30/2019 - reported persistent atrial fibrillation since end of May.  - His defibrillator was turned off about a year ago.  Type 2 Diabetes  [place] - Continue sliding scale insulin.   Hypothyroidism - Continue home Synthroid.   For questions or updates, please contact [place] Please consult [email or web address] for contact info under        Signed, Darreld Mclean, PA-C  05/03/2019, 7:27 AM    Personally seen and examined. Agree with above.  Continues with noticeable shortness of breath while laying in bed. We will continue to proceed with diuresis.  Overall goal approximately 2 L/day. I will also ask pulmonary to see him for concomitant COPD to see if there is any further treatment strategies to invoke.  Certainly this could all be related to acute heart failure.  Candee Furbish, MD

## 2019-05-03 NOTE — Progress Notes (Signed)
[place] for warfarin  Indication: atrial fibrillation pending cardioversion on 7/10  Allergies  Allergen Reactions  . Adhesive [Tape] Other (See Comments)    PATIENT'S SKIN TEARS AND BRUISES VERY EASILY- IS TAKING COUMADIN!!  . Aldactone [Spironolactone] Other (See Comments)    Hyperkalemia  reported by Dr. [name] - pt is currently taking 12.5 mg daily -November 2017 per medication list by same MD    Patient Measurements: Height: 5\' 6"  (167.6 cm) Weight: 189 lb 6.4 oz (85.9 kg) IBW/kg (Calculated) : 63.8 Heparin Dosing Weight: 81.9 kg  Vital Signs: Temp: 97.8 F (36.6 C) (07/09 0451) Temp Source: Oral (07/09 0451) BP: 110/76 (07/09 0451) Pulse Rate: 102 (07/09 0803)  Labs: Recent Labs    05/02/19 1813 05/03/19 0503  HGB 13.2 12.9*  HCT 40.9 39.6  PLT 218 194  LABPROT 30.8* 27.9*  INR 3.0* 2.7*  CREATININE 1.25* 1.26*   Estimated Creatinine Clearance: 45.6 mL/min (A) (by C-G formula based on SCr of 1.26 mg/dL (H)).  Medical History: Past Medical History:  Diagnosis Date  . Atrial fibrillation ([place])    persistent, previously seen at [facility] and placed on amiodarone  . Benign prostatic hypertrophy   . CAD (coronary artery disease)    multivessel s/p inferolateral wall MI with subsequent CABG 11/1998.  Cath 2009 with Patent grafts  . Cleft palate   . COPD with emphysema ([place]) 04/01/2010  . DM (diabetes mellitus), type 2 ([place])   . Dyspnea   . GERD (gastroesophageal reflux disease)   . HTN (hypertension)   . Hyperlipidemia   . Hypothyroidism   . Iron deficiency anemia   . Ischemic dilated cardiomyopathy ([place])    EF 35-40% by MUGA 6/11  . Myocardial infarction ([place])   . Nasal septal deviation   . Nephrolithiasis   . OSA (obstructive sleep apnea)   . PAF (paroxysmal atrial fibrillation) ([place])   . Peripheral arterial disease (HCC)    left subclavian artery stenosis  . PNA (pneumonia)   . Psoriasis   . Seborrheic  keratosis   . Stroke ([place])   . Systolic congestive heart failure ([place]) 2009   s/p BiV ICD implantation by Dr Leonia Reeves (MDT)    Medications:  Scheduled:  . albuterol  2.5 mg Nebulization TID  . amiodarone  400 mg Oral BID  . atorvastatin  80 mg Oral q1800  . carvedilol  3.125 mg Oral BID WC  . cholecalciferol  2,000 Units Oral QPM  . feeding supplement (ENSURE ENLIVE)  237 mL Oral BID BM  . ferrous sulfate  325 mg Oral QPM  . fluticasone  2 spray Each Nare Daily  . fluticasone furoate-vilanterol  1 puff Inhalation Daily   And  . umeclidinium bromide  1 puff Inhalation Daily  . furosemide  40 mg Intravenous BID  . insulin aspart  0-15 Units Subcutaneous TID WC  . insulin aspart  0-5 Units Subcutaneous QHS  . Insulin Degludec-Liraglutide  14 Units Subcutaneous q morning - 10a  . levothyroxine  100 mcg Oral QAC breakfast  . multivitamin  1 tablet Oral BID  . potassium chloride  20 mEq Oral BID  . senna  1 tablet Oral BID  . sodium chloride flush  3 mL Intravenous Q12H  . vitamin B-12  1,000 mcg Oral Daily  . Warfarin - Pharmacist Dosing Inpatient   Does not apply q1800   Infusions:  . sodium chloride      Assessment: 53 yom presents with  persistent a fib pending cardioversion for 7/10. Pt on warfarin 5 mg daily PTA per coumadin clinic note on 6/29.  Baseline INR 3. Amiodarone dose increased to 400mg  bid on 7/8 -INR= 2.7  Goal of Therapy:  INR 2-3 Monitor platelets by anticoagulation protocol: Yes   Plan:  warfarin 5 mg  daily INR  Hildred Laser, PharmD Clinical Pharmacist **Pharmacist phone directory can now be found on [email or web address] ([email or web address]).  Listed under [place].

## 2019-05-03 NOTE — Progress Notes (Signed)
Patient wife brought in home insulin pen from home, nurse informed floor pharmacist Mitzi Hansen, put in order for patient to receive initial dose of 14 units. Home med form fill out and given to [place] along with insulin pen to take to pharmacy.

## 2019-05-03 NOTE — Consult Note (Signed)
   [facility] CM Inpatient Consult   05/03/2019  AVYUKT CIMO [birth year] [phone]  Patient is currently active with [facility] for chronic disease management services.  Patient has been engaged by a [facility] Coordinator.  Coordinator is aware of patient's hospitalization.  Our community based plan of care has focused on disease management and community resource support.    Patient will receive a post hospital call and will be evaluated for assessments and disease process education.    Plan: Follow for disposition and needs.   Of note, [facility] Care Management services does not replace or interfere with any services that are needed or arranged by inpatient [facility] care management team.  For additional questions or referrals please contact:   Natividad Brood, RN BSN [facility] Liaison  [phone] business mobile phone Toll free office [phone]  Fax number: [phone] [email or web address]

## 2019-05-03 NOTE — Plan of Care (Signed)
  Problem: Education: Goal: Knowledge of General Education information will improve Description Including pain rating scale, medication(s)/side effects and non-pharmacologic comfort measures Outcome: Progressing   Problem: Health Behavior/Discharge Planning: Goal: Ability to manage health-related needs will improve Outcome: Progressing   

## 2019-05-04 DIAGNOSIS — I2510 Atherosclerotic heart disease of native coronary artery without angina pectoris: Secondary | ICD-10-CM

## 2019-05-04 LAB — GLUCOSE, CAPILLARY
Glucose-Capillary: 107 mg/dL — ABNORMAL HIGH (ref 70–99)
Glucose-Capillary: 179 mg/dL — ABNORMAL HIGH (ref 70–99)
Glucose-Capillary: 149 mg/dL — ABNORMAL HIGH (ref 70–99)
Glucose-Capillary: 145 mg/dL — ABNORMAL HIGH (ref 70–99)

## 2019-05-04 LAB — BASIC METABOLIC PANEL
Anion gap: 11 (ref 5–15)
BUN: 20 mg/dL (ref 8–23)
CO2: 30 mmol/L (ref 22–32)
Calcium: 9.5 mg/dL (ref 8.9–10.3)
Chloride: 101 mmol/L (ref 98–111)
Creatinine, Ser: 1.36 mg/dL — ABNORMAL HIGH (ref 0.61–1.24)
GFR calc Af Amer: 55 mL/min — ABNORMAL LOW (ref >60)
GFR calc non Af Amer: 48 mL/min — ABNORMAL LOW (ref >60)
Glucose, Bld: 113 mg/dL — ABNORMAL HIGH (ref 70–99)
Potassium: 3.9 mmol/L (ref 3.5–5.1)
Sodium: 142 mmol/L (ref 135–145)

## 2019-05-04 LAB — PROTIME-INR
INR: 2.2 — ABNORMAL HIGH (ref 0.8–1.2)
Prothrombin Time: 24.1 seconds — ABNORMAL HIGH (ref 11.4–15.2)

## 2019-05-04 MED ORDER — WARFARIN SODIUM 5 MG PO TABS
5.0000 mg | ORAL_TABLET | Freq: Once | ORAL | Status: AC
  Administered 2019-05-04: 5 mg via ORAL
  Filled 2019-05-04: qty 1

## 2019-05-04 MED ORDER — LOSARTAN POTASSIUM 25 MG PO TABS
12.5000 mg | ORAL_TABLET | Freq: Every day | ORAL | Status: DC
  Administered 2019-05-04: 12.5 mg via ORAL
  Filled 2019-05-04 (×2): qty 1

## 2019-05-04 NOTE — Progress Notes (Signed)
Patient says CPAP is not comfortable, CPAP removed. Says he will see if someone can bring his home CPAP if he has to stay again.

## 2019-05-04 NOTE — Progress Notes (Signed)
[place] for warfarin  Indication: atrial fibrillation   Allergies  Allergen Reactions  . Adhesive [Tape] Other (See Comments)    PATIENT'S SKIN TEARS AND BRUISES VERY EASILY- IS TAKING COUMADIN!!  . Aldactone [Spironolactone] Other (See Comments)    Hyperkalemia  reported by Dr. [name] - pt is currently taking 12.5 mg daily -November 2017 per medication list by same MD    Patient Measurements: Height: 5\' 6"  (167.6 cm) Weight: 187 lb 12.8 oz (85.2 kg) IBW/kg (Calculated) : 63.8 Heparin Dosing Weight: 81.9 kg  Vital Signs: Temp: 97.8 F (36.6 C) (07/10 0821) Temp Source: Oral (07/10 0821) BP: 113/83 (07/10 0821) Pulse Rate: 97 (07/10 0821)  Labs: Recent Labs    05/02/19 1813 05/03/19 0503 05/04/19 0517  HGB 13.2 12.9*  --   HCT 40.9 39.6  --   PLT 218 194  --   LABPROT 30.8* 27.9* 24.1*  INR 3.0* 2.7* 2.2*  CREATININE 1.25* 1.26* 1.36*   Estimated Creatinine Clearance: 42.1 mL/min (A) (by C-G formula based on SCr of 1.36 mg/dL (H)).  Medical History: Past Medical History:  Diagnosis Date  . Atrial fibrillation ([place])    persistent, previously seen at [facility] and placed on amiodarone  . Benign prostatic hypertrophy   . CAD (coronary artery disease)    multivessel s/p inferolateral wall MI with subsequent CABG 11/1998.  Cath 2009 with Patent grafts  . Cleft palate   . COPD with emphysema ([place]) 04/01/2010  . DM (diabetes mellitus), type 2 ([place])   . Dyspnea   . GERD (gastroesophageal reflux disease)   . HTN (hypertension)   . Hyperlipidemia   . Hypothyroidism   . Iron deficiency anemia   . Ischemic dilated cardiomyopathy ([place])    EF 35-40% by MUGA 6/11  . Myocardial infarction ([place])   . Nasal septal deviation   . Nephrolithiasis   . OSA (obstructive sleep apnea)   . PAF (paroxysmal atrial fibrillation) ([place])   . Peripheral arterial disease (HCC)    left subclavian artery stenosis  . PNA (pneumonia)   . Psoriasis   .  Seborrheic keratosis   . Stroke ([place])   . Systolic congestive heart failure ([name]) 2009   s/p BiV ICD implantation by Dr Leonia Reeves (MDT)    Medications:  Scheduled:  . albuterol  2.5 mg Nebulization TID  . amiodarone  400 mg Oral BID  . atorvastatin  80 mg Oral q1800  . carvedilol  3.125 mg Oral BID WC  . cholecalciferol  2,000 Units Oral QPM  . feeding supplement (ENSURE ENLIVE)  237 mL Oral BID BM  . ferrous sulfate  325 mg Oral QPM  . fluticasone  2 spray Each Nare Daily  . fluticasone furoate-vilanterol  1 puff Inhalation Daily   And  . umeclidinium bromide  1 puff Inhalation Daily  . furosemide  40 mg Intravenous BID  . insulin aspart  0-15 Units Subcutaneous TID WC  . insulin aspart  0-5 Units Subcutaneous QHS  . Insulin Degludec-Liraglutide  14 Units Subcutaneous q morning - 10a  . levothyroxine  100 mcg Oral QAC breakfast  . multivitamin  1 tablet Oral BID  . potassium chloride  20 mEq Oral BID  . senna  1 tablet Oral BID  . sodium chloride flush  3 mL Intravenous Q12H  . vitamin B-12  1,000 mcg Oral Daily  . Warfarin - Pharmacist Dosing Inpatient   Does not apply q1800   Infusions:  . sodium  chloride      Assessment: 29 yom presents with persistent a fib. Pt on warfarin 5 mg daily PTA per coumadin clinic note on 6/29.  Baseline INR 3. Amiodarone dose increased to 400mg  bid on 7/8. 7/10 INR 2.2 (at goal)  Goal of Therapy:  INR 2-3 Monitor platelets by anticoagulation protocol: Yes   Plan:  - Continue warfarin 5 mg  - Daily INR  Agnes Lawrence, PharmD PGY1 Pharmacy Resident

## 2019-05-04 NOTE — Progress Notes (Addendum)
Progress Note  Patient Name: [name] Date of Encounter: 05/04/2019  Primary Cardiologist: Larae Grooms, MD   Subjective   No significant overnight events. Patient feels like breathing has improved some and that he is getting closer to his baseline but I don't think he is quite there yet. No chest pain, palpitations, lightheadedness, or dizziness.   Inpatient Medications    Scheduled Meds: . albuterol  2.5 mg Nebulization TID  . amiodarone  400 mg Oral BID  . atorvastatin  80 mg Oral q1800  . carvedilol  3.125 mg Oral BID WC  . cholecalciferol  2,000 Units Oral QPM  . feeding supplement (ENSURE ENLIVE)  237 mL Oral BID BM  . ferrous sulfate  325 mg Oral QPM  . fluticasone  2 spray Each Nare Daily  . fluticasone furoate-vilanterol  1 puff Inhalation Daily   And  . umeclidinium bromide  1 puff Inhalation Daily  . furosemide  40 mg Intravenous BID  . insulin aspart  0-15 Units Subcutaneous TID WC  . insulin aspart  0-5 Units Subcutaneous QHS  . Insulin Degludec-Liraglutide  14 Units Subcutaneous q morning - 10a  . levothyroxine  100 mcg Oral QAC breakfast  . multivitamin  1 tablet Oral BID  . potassium chloride  20 mEq Oral BID  . senna  1 tablet Oral BID  . sodium chloride flush  3 mL Intravenous Q12H  . vitamin B-12  1,000 mcg Oral Daily  . Warfarin - Pharmacist Dosing Inpatient   Does not apply q1800   Continuous Infusions: . sodium chloride     PRN Meds: sodium chloride, acetaminophen, calcium carbonate, dextromethorphan-guaiFENesin, ondansetron (ZOFRAN) IV, sodium chloride flush, traMADol   Vital Signs    Vitals:   05/04/19 0603 05/04/19 0751 05/04/19 0752 05/04/19 0821  BP:    113/83  Pulse:  91  97  Resp:  18  20  Temp:    97.8 F (36.6 C)  TempSrc:    Oral  SpO2:  93% 93% 100%  Weight: 85.2 kg     Height:        Intake/Output Summary (Last 24 hours) at 05/04/2019 1011 Last data filed at 05/04/2019 0809 Gross per 24 hour  Intake 840 ml   Output 1450 ml  Net -610 ml   Last 3 Weights 05/04/2019 05/03/2019 05/02/2019  Weight (lbs) 187 lb 12.8 oz 189 lb 6.4 oz 191 lb 6.4 oz  Weight (kg) 85.186 kg 85.911 kg 86.818 kg      Telemetry    Ventricular paced rhythm with underlying sinus rhythm (clear P waves and R to R interval regular). - Personally Reviewed  ECG    No new ECG tracing today. - Personally Reviewed  Physical Exam   GEN: Elderly Caucasian male siting comfortably in chair in no acute distress.   Neck: Supple. No JVD appreciated. Cardiac: RRR. No murmurs, rubs, or gallops.  Respiratory: Noticeably short of breath while talking. Mild crackles noted in bilateral bases. (right > left). No significant wheezes or rhonchi noted. GI: Soft, non-distended, and non-tender. Bowel sounds present.  MS: No to trace lower extremity edema. No deformity. Skin: Warm and dry. Neuro: No focal deficits. Psych: Normal affect. Responds appropriately.   Labs    High Sensitivity Troponin:  No results for input(s): TROPONINIHS in the last 720 hours.    Cardiac EnzymesNo results for input(s): TROPONINI in the last 168 hours. No results for input(s): TROPIPOC in the last 168 hours.   Chemistry  Recent Labs  Lab 05/02/19 1813 05/03/19 0503 05/04/19 0517  NA 139 140 142  K 4.5 3.9 3.9  CL 102 104 101  CO2 28 26 30   GLUCOSE 136* 92 113*  BUN 17 15 20   CREATININE 1.25* 1.26* 1.36*  CALCIUM 9.2 9.0 9.5  PROT 7.0  --   --   ALBUMIN 3.4*  --   --   AST 20  --   --   ALT 14  --   --   ALKPHOS 81  --   --   BILITOT 0.7  --   --   GFRNONAA 53* 52* 48*  GFRAA >60 >60 55*  ANIONGAP 9 10 11      Hematology Recent Labs  Lab 05/02/19 1813 05/03/19 0503  WBC 8.3 7.5  RBC 4.34 4.28  HGB 13.2 12.9*  HCT 40.9 39.6  MCV 94.2 92.5  MCH 30.4 30.1  MCHC 32.3 32.6  RDW 14.8 14.8  PLT 218 194    BNP Recent Labs  Lab 05/02/19 1813  BNP 1,255.8*     DDimer No results for input(s): DDIMER in the last 168 hours.   Radiology     Dg Chest 2 View  Result Date: 05/03/2019 CLINICAL DATA:  Shortness of breath for 3 days, CHF, COPD, atrial fibrillation, coronary artery disease post MI and CABG, type II diabetes mellitus, hypertension EXAM: CHEST - 2 VIEW COMPARISON:  01/13/2019 FINDINGS: LEFT subclavian AICD with leads projecting at RIGHT atrium, RIGHT ventricle, and coronary sinus. Enlargement of cardiac silhouette post CABG. Atherosclerotic calcification aorta. Pulmonary vascular congestion without gross pulmonary edema. Mild central peribronchial thickening. Persistent bibasilar effusions and atelectasis greater on RIGHT. No pneumothorax. Bones demineralized. IMPRESSION: Enlargement of cardiac silhouette post CABG and AICD with pulmonary vascular congestion. Small bibasilar pleural effusions and atelectasis greater on RIGHT. No definite acute infiltrate. Electronically Signed   By: Lavonia Dana M.D.   On: 05/03/2019 07:59    Cardiac Studies   Echocardiogram 09/19/2018: Study Conclusions: - Left ventricle: The cavity size was severely dilated. Systolic   function was moderately to severely reduced. The estimated   ejection fraction was in the range of 30% to 35%. Severe diffuse   hypokinesis with distinct regional wall motion abnormalities.   There is severe hypokinesis to akinesis of the inferolateral and   inferior myocardium. There is hypokinesis of the lateral   myocardium. There is akinesis of the apical septal, apical   anterior and apical myocardium. - Aortic valve: Mildly to moderately calcified annulus. Trileaflet;   normal thickness, moderately calcified leaflets. Valve area   (VTI): 2.55 cm^2. Valve area (Vmax): 2.7 cm^2. Valve area   (Vmean): 2.13 cm^2. - Aorta: Aortic root dimension: 39 mm (ED). - Aortic root: The aortic root was mildly dilated. - Mitral valve: There was mild regurgitation. Valve area by   continuity equation (using LVOT flow): 2.29 cm^2. - Left atrium: The atrium was severely dilated. -  Right atrium: The atrium was mildly dilated. - Pulmonic valve: There was trivial regurgitation. - Pulmonary arteries: PA peak pressure: 45 mm Hg (S).  Impressions: - The right ventricular systolic pressure was increased consistent   with moderate pulmonary hypertension.  Patient Profile   [name] is a 85 y.o. male with a history of CAD s/p CABG in 2000 with patent grafts on cardiac catheterization in 2009 and negative Myoview in 2706, chronic systolic CHF/ischemic cardiomyopathy with EF of 30-35%, persistent atrial fibrillation s/p PVI ablation in 2017 on Amiodarone and Coumadin,  hypertension, hyperlipidemia, diabetes mellitus, CVA, COPD, hypothyroidism, PAD with left subclavian stenosis, obstructive sleep apnea on CPAP, and CKD, who was admitted from our office on 05/02/2019 with acute on chronic systolic CHF.  Assessment & Plan    Acute on Chronic Systolic CHF - Patient presented to office yesterday and reported progressively worsening dyspnea over the past week in the setting of recurrent atrial fibrillation. Decision was made to admit him for IV diuresis and subsequent cardioversion. - Most recent Echo from 08/2018 showed 30-35% with multiple wall motion abnormalities. See full report above. - BNP elevated at 1,255.8. - Chest x-ray this morning showed pulmonary vascular congestion without gross pulmonary edema as well as small bibasilar pleural effusion and atelectasis greater on right. - Currently on IV Lasix 40mg  twice daily. Documented urinary output of 1.65 L in the last 24 hours and net negative 1.3 L since admission. Weight today 187 lbs, down from 191 lbs on admission. Creatinine slightly higher today. - I think patient needs one more day of IV diuresis. Continue Lasix at current dose. - Patient does not appear to be on an ACE/ARB at home. Will add Losartan 12.5mg  daily. - Continue Coreg 3.125mg  twice daily. - Continue to monitor daily weights, strict I/O's, and renal function.   Paroxysmal Atrial Fibrillation - Telemetry shows ventricular paced rhythm with underlying sinus rhythm.  - He has been taking Amiodarone 100mg  daily over the past 2 weeks. This was increased to 400mg  twice daily after office visit on 7/8 and since then he has converted to sinus rhythm. Continue current dose. - Continue Coerg 3.125mg  twice daily.  - Continue anticoagulation with Coumadin. INR 2.2 today.  CAD - History of CABG in 2000 with patent grafts on cardiac catheterization in 2009. Stable. Patient denies any angina. He is not on Aspirin due to Coumadin. Continue high intensity statin and beta-blocker.  COPD - Pulmonology was consulted yesterday for assistance with optimizing COPD regimen and to make sure COPD was not playing a role in his dyspnea. Appreciate their assistance. Pulmonology felt like COPD and obstructive sleep apnea treatment more adequate and did not recommend any changes at this time. Will continue home Breo Ellipta, Incruse Ellipta, PRN Albuterol, and Flonase as well as PRN Mucinex for phlegm/cough. Pulmonology has signed off but arranged outpatient follow -up and work-up for pulmonary nodule.   Biventricular ICD in Place - Device was last checked on 04/30/2019 - reported persistent atrial fibrillation since end of May.  - His defibrillator was turned off about a year ago.  Type 2 Diabetes Mellitus - Continue sliding scale insulin.   Hypothyroidism - Continue home Synthroid.  CKD Stage III - Serum creatinine 1.25 on admission. Baseline between 1.1 and 1.5.  - Creatinine 1.36 today. - Continue daily BMET.   For questions or updates, please contact [place] Please consult [email or web address] for contact info under        Signed, Darreld Mclean, PA-C  05/04/2019, 10:11 AM    Personally seen and examined. Agree with above.   Continue with IV lasix today. Still not at baseline.  Crackles in bases Perhaps tomorrow DC? On DC, reduce amiodarone.  In NSR  now.  Paced. Tele reviewed personally.   Candee Furbish, MD

## 2019-05-04 NOTE — Care Management Important Message (Signed)
Important Message  Patient Details  Name: [name] MRN: [id number] Date of Birth: 1934/02/01   Medicare Important Message Given:  Yes     Shelda Altes 05/04/2019, 12:57 PM

## 2019-05-04 NOTE — Progress Notes (Signed)
Patient doesn't want to wear CPAP tonight. States the machine is just to strong for him to sleep. Will call if he changes his mind, but states he should be going home tomorrow.

## 2019-05-05 ENCOUNTER — Other Ambulatory Visit: Payer: Self-pay | Admitting: Physician Assistant

## 2019-05-05 LAB — BASIC METABOLIC PANEL
Anion gap: 12 (ref 5–15)
BUN: 26 mg/dL — ABNORMAL HIGH (ref 8–23)
CO2: 28 mmol/L (ref 22–32)
Calcium: 9.3 mg/dL (ref 8.9–10.3)
Chloride: 100 mmol/L (ref 98–111)
Creatinine, Ser: 1.29 mg/dL — ABNORMAL HIGH (ref 0.61–1.24)
GFR calc Af Amer: 59 mL/min — ABNORMAL LOW (ref >60)
GFR calc non Af Amer: 51 mL/min — ABNORMAL LOW (ref >60)
Glucose, Bld: 105 mg/dL — ABNORMAL HIGH (ref 70–99)
Potassium: 4.2 mmol/L (ref 3.5–5.1)
Sodium: 140 mmol/L (ref 135–145)

## 2019-05-05 LAB — GLUCOSE, CAPILLARY
Glucose-Capillary: 128 mg/dL — ABNORMAL HIGH (ref 70–99)
Glucose-Capillary: 195 mg/dL — ABNORMAL HIGH (ref 70–99)

## 2019-05-05 LAB — PROTIME-INR
INR: 2.5 — ABNORMAL HIGH (ref 0.8–1.2)
Prothrombin Time: 27 seconds — ABNORMAL HIGH (ref 11.4–15.2)

## 2019-05-05 MED ORDER — AMIODARONE HCL 200 MG PO TABS: 200.0000 mg | ORAL_TABLET | Freq: Every day | ORAL | 3 refills | Status: AC

## 2019-05-05 MED ORDER — LOSARTAN POTASSIUM 25 MG PO TABS: 25.0000 mg | ORAL_TABLET | Freq: Every day | ORAL | 3 refills | Status: DC

## 2019-05-05 MED ORDER — AMIODARONE HCL 200 MG PO TABS: 200.0000 mg | ORAL_TABLET | Freq: Every day | ORAL | Status: DC

## 2019-05-05 MED ORDER — LOSARTAN POTASSIUM 25 MG PO TABS: 25.0000 mg | ORAL_TABLET | Freq: Every day | ORAL | 0 refills | Status: DC

## 2019-05-05 MED ORDER — LOSARTAN POTASSIUM 25 MG PO TABS
25.0000 mg | ORAL_TABLET | Freq: Every day | ORAL | Status: DC
  Administered 2019-05-05: 10:00:00 25 mg via ORAL

## 2019-05-05 MED ORDER — ALBUTEROL SULFATE (2.5 MG/3ML) 0.083% IN NEBU: 2.5000 mg | INHALATION_SOLUTION | RESPIRATORY_TRACT | Status: DC | PRN

## 2019-05-05 MED ORDER — ALBUTEROL SULFATE (2.5 MG/3ML) 0.083% IN NEBU: 2.5000 mg | INHALATION_SOLUTION | Freq: Two times a day (BID) | RESPIRATORY_TRACT | Status: DC

## 2019-05-05 MED ORDER — CARVEDILOL 3.125 MG PO TABS: 3.1250 mg | ORAL_TABLET | Freq: Two times a day (BID) | ORAL | 3 refills | Status: AC

## 2019-05-05 NOTE — Progress Notes (Signed)
Progress Note  Patient Name: [name] Date of Encounter: 05/05/2019  Primary Cardiologist: Larae Grooms, MD   Subjective   Breathing is improved, no chest pain, minimal chronic cough.  Inpatient Medications    Scheduled Meds: . albuterol  2.5 mg Nebulization TID  . amiodarone  400 mg Oral BID  . atorvastatin  80 mg Oral q1800  . carvedilol  3.125 mg Oral BID WC  . cholecalciferol  2,000 Units Oral QPM  . feeding supplement (ENSURE ENLIVE)  237 mL Oral BID BM  . ferrous sulfate  325 mg Oral QPM  . fluticasone  2 spray Each Nare Daily  . fluticasone furoate-vilanterol  1 puff Inhalation Daily   And  . umeclidinium bromide  1 puff Inhalation Daily  . furosemide  40 mg Intravenous BID  . insulin aspart  0-15 Units Subcutaneous TID WC  . insulin aspart  0-5 Units Subcutaneous QHS  . Insulin Degludec-Liraglutide  14 Units Subcutaneous q morning - 10a  . levothyroxine  100 mcg Oral QAC breakfast  . losartan  12.5 mg Oral Daily  . multivitamin  1 tablet Oral BID  . potassium chloride  20 mEq Oral BID  . senna  1 tablet Oral BID  . sodium chloride flush  3 mL Intravenous Q12H  . vitamin B-12  1,000 mcg Oral Daily  . Warfarin - Pharmacist Dosing Inpatient   Does not apply q1800   Continuous Infusions: . sodium chloride     PRN Meds: sodium chloride, acetaminophen, calcium carbonate, dextromethorphan-guaiFENesin, ondansetron (ZOFRAN) IV, sodium chloride flush, traMADol   Vital Signs    Vitals:   05/04/19 2029 05/05/19 0043 05/05/19 0544 05/05/19 0714  BP:   113/77   Pulse:   94   Resp:   18   Temp:   (!) 97.4 F (36.3 C)   TempSrc:   Oral   SpO2: 96%  91% 94%  Weight:  84.1 kg    Height:        Intake/Output Summary (Last 24 hours) at 05/05/2019 0846 Last data filed at 05/05/2019 0500 Gross per 24 hour  Intake 723 ml  Output 800 ml  Net -77 ml   Last 3 Weights 05/05/2019 05/04/2019 05/03/2019  Weight (lbs) 185 lb 7.9 oz 187 lb 12.8 oz 189 lb 6.4 oz   Weight (kg) 84.14 kg 85.186 kg 85.911 kg  Some encounter information is confidential and restricted. Go to Review Flowsheets activity to see all data.      Telemetry    Ventricular paced sinus rhythm- Personally Reviewed  ECG    No new- Personally Reviewed  Physical Exam  Elderly GEN: No acute distress.   Neck: No JVD Cardiac: RRR, no murmurs, rubs, or gallops.  Respiratory: Clear to auscultation bilaterally. GI: Soft, nontender, non-distended  MS: No edema; No deformity.  TED hose Neuro:  Nonfocal  Psych: Normal affect   Labs    High Sensitivity Troponin:  No results for input(s): TROPONINIHS in the last 720 hours.    Cardiac EnzymesNo results for input(s): TROPONINI in the last 168 hours. No results for input(s): TROPIPOC in the last 168 hours.   Chemistry Recent Labs  Lab 05/02/19 1813 05/03/19 0503 05/04/19 0517 05/05/19 0411  NA 139 140 142 140  K 4.5 3.9 3.9 4.2  CL 102 104 101 100  CO2 28 26 30 28   GLUCOSE 136* 92 113* 105*  BUN 17 15 20  26*  CREATININE 1.25* 1.26* 1.36* 1.29*  CALCIUM 9.2  9.0 9.5 9.3  PROT 7.0  --   --   --   ALBUMIN 3.4*  --   --   --   AST 20  --   --   --   ALT 14  --   --   --   ALKPHOS 81  --   --   --   BILITOT 0.7  --   --   --   GFRNONAA 53* 52* 48* 51*  GFRAA >60 >60 55* 59*  ANIONGAP 9 10 11 12      Hematology Recent Labs  Lab 05/02/19 1813 05/03/19 0503  WBC 8.3 7.5  RBC 4.34 4.28  HGB 13.2 12.9*  HCT 40.9 39.6  MCV 94.2 92.5  MCH 30.4 30.1  MCHC 32.3 32.6  RDW 14.8 14.8  PLT 218 194    BNP Recent Labs  Lab 05/02/19 1813  BNP 1,255.8*     DDimer No results for input(s): DDIMER in the last 168 hours.   Radiology    No results found.  Cardiac Studies   Echo EF 30%  Patient Profile     85 y.o. male CAD s/p CABG in 2000 with patent grafts on cardiac catheterization in 2009 and negative Myoview in 2778, chronic systolic CHF/ischemic cardiomyopathy with EF of 30-35%, persistent atrial fibrillation  s/p PVI ablation in 2017 on Amiodarone and Coumadin, hypertension, hyperlipidemia, diabetes mellitus, CVA, COPD, hypothyroidism, PAD with left subclavian stenosis, obstructive sleep apnea on CPAP, and CKD, who was admitted from our office on 05/02/2019 with acute on chronic systolic CHF.  Assessment & Plan    Acute on chronic systolic heart failure - Good overall diuresis.  Weight is down to 185 pounds.  Admit weight 191 - Losartan added-I will increase to 25 mg once a day.  Not sure if he will have the room for [facility] given blood pressure. -Keep carvedilol at 3.125 mg twice a day-blood pressure 113/77-102/66  Paroxysmal atrial fibrillation - Ventricular pacing, sinus rhythm noted on telemetry.  Previously on amiodarone 100 mg a day over the past 2 weeks prior to admission.  He was reloaded with 400 mg twice a day here.   -At home, I will transition amiodarone back to 200 mg once a day.  Chronic anticoagulation -Coumadin-INR 2.5.  Coronary artery disease -CABG 2000, 2009 catheterization patent grafts.  No aspirin since he is on Coumadin.  High intensity statin and beta-blocker.  COPD -Appreciate pulmonary consult during this hospitalization to optimize regimen.  No changes made.  Home medications reviewed.  Close follow-up needed by pulmonary for continued COPD management and work-up of pulmonary nodule.  Biventricular ICD - Previously reported atrial fibrillation since the end of May.  It appears that he has P waves currently.  Perhaps the increase in the amiodarone helped.  Defibrillator function has been turned off a little over a year ago.  Type 2 diabetes with hypertension - Per primary team.  Hypothyroidism -Synthroid  Chronic kidney disease stage III - Morning 1.29.  He is ready for discharge, feels comfortable with this.  We will make sure that his wife understands instructions that are new.  Close follow-up with [name] or APP team  For questions or updates, please  contact [place] Please consult [email or web address] for contact info under        Signed, Candee Furbish, MD  05/05/2019, 8:46 AM

## 2019-05-05 NOTE — Discharge Summary (Addendum)
Discharge Summary    Patient ID: [name] MRN: [id number]; DOB: [birth year]  Admit date: 05/02/2019 Discharge date: 05/05/2019  Primary Care Provider: [name], MD  Primary Cardiologist: Larae Grooms, MD  Primary Electrophysiologist:  Thompson Grayer, MD   Discharge Diagnoses    Principal Problem:   Acute on chronic systolic heart failure [facility]) Active Problems:   Coronary artery disease   Diabetes mellitus with diabetic neuropathy, with long-term current use of insulin (HCC)   A-fib ([place])   ICD (implantable cardioverter-defibrillator) lead failure, sequela  Allergies Allergies  Allergen Reactions   Adhesive [Tape] Other (See Comments)    PATIENT'S SKIN TEARS AND BRUISES VERY EASILY- IS TAKING COUMADIN!!   Aldactone [Spironolactone] Other (See Comments)    Hyperkalemia  reported by Dr. [name] - pt is currently taking 12.5 mg daily -November 2017 per medication list by same MD   Diagnostic Studies/Procedures    None  _____________ History of Present Illness     [name] is an 85 y.o. male with a hx of CAD s/p CABG in 2000 with patent grafts on cardiac catheterization in 2009 and negative Myoview in 4401, chronic systolic CHF/ischemic cardiomyopathy with EF of 30-35%, persistent atrial fibrillation s/p PVI ablation in 2017 on Amiodarone and Coumadin, hypertension, hyperlipidemia, diabetes mellitus, CVA, COPD, hypothyroidism, PAD with left subclavian stenosis, obstructive sleep apnea on CPAP and CKD who was admitted from our office on 05/02/2019 with acute on chronic systolic CHF.  Hospital Course   [name] was evaluated in the atrial fibrillation clinic last month secondary to recurrent atrial fibrillation noted on his device.  His amiodarone was increased with restoration of normal sinus rhythm.  He called the office with concern for increased shortness of breath and weight gain.  His thoracic impedance was decreased indicating fluid volume overload.   His device also demonstrated recurrent atrial fibrillation.  He was seen by Roderic Palau, NP with the atrial fibrillation clinic in which his amiodarone was increased once again.  He was seen by Richardson Dopp 05/02/2019 for further evaluation and management.  Per chart review, he began feeling poorly several days prior to this appointment.  He was reported to have shortness of breath with minimal activity that had been progressively worsening.  He was sleeping on an incline and was having PND along with LE edema.  He had no chest pain or syncope.  Plan was to admit to the hospital for diuresis and subsequent cardioversion.  Plan was discussed with Dr. Caryl Comes who agreed.  He was sent to [facility] on 05/02/2019.  IV Lasix 40 mg twice daily was started.  Most recent echocardiogram from 08/2018 showed an LVEF of 30 to 35% with multiple wall motion abnormalities.  On presentation, his BNP was elevated at 1255.  CXR with pulmonary vascular congestion without gross pulmonary edema as well as bibasilar pleural effusion and atelectasis right greater than left.  He was continued on IV diuretics.  By day of discharge, 05/05/2019 he had overall good diuresis.  Weight was down to 185lbs with an admission weight noted at 191lbs.  Losartan was added to his regimen which was increased to 25 mg daily on day of discharge.  It was unclear if he would tolerate Entresto given current BP status however this can be monitored in the outpatient setting.   During his hospitalization, pulmonary critical care was asked to consult in the setting of COPD treatment in which no changes to his current regimen were made.  He was arranged  for PCCM follow-up with Dr. Halford Chessman for COPD and OSA management.   Other hospital problems include:  Paroxysmal atrial fibrillation: -Hospital telemetry for the most part showed ventricular paced rhythm with underlying sinus rhythm -He had a recent increase in his amiodarone to 400 mg twice daily after an office visit.   This was continued throughout his hospitalization and decreased on 05/05/2019 to 200 mg once daily -Continue carvedilol 3.125 mg twice daily and anticoagulation with Coumadin -Spoke with inpatient pharmacist who is comfortable with his home dose of 5 mg daily of Coumadin and follow-up with INR check 06/04/2019 given no significant fluctuations -Discharge INR, 2.5  Coronary artery disease status post CABG in 2000: -History of CABG in 2000 with patent grafts on cardiac catheterization in 2009 -Stable without complaints of anginal symptoms -Not on ASA secondary to Coumadin -Continue high intensity statin, beta-blocker  Biventricular ICD placement: -Device interrogation on 04/30/2019 reported with persistent atrial fibrillation since the end of May -P waves noted during hospitalization -Defibrillator was turned off approximately 1 year ago -Follows with Dr. Rayann Heman  DM2: -Stable, to follow with PCP  CKD stage III: -Creatinine, 1.29 on day of discharge -We will need repeat BMET at follow up   COPD with Pulmonary nodule: -PCCM consulted for medication regimen  -Continue Breo Ellipta, Incruse Ellipta, PRN Albuterol and Flonase -CT Chest 08/2018 with Irregular 1.1 x 1.3 x 1.9 cm LEFT UPPER lobe nodule worrisome for Malignancy -Will need outpatient work-up for pulmonary nodule >> will set up pulmonary outpatient appointment>>scheduled   Consultants: Pulmonary critical care  The patient was seen and examined by Dr. Marlou Porch who feels that he is stable and ready for discharge today, 05/05/2019.  Follow-up appointment will be made and patient will be contacted via telephone with date and time.  _____________  Discharge Vitals Blood pressure 112/68, pulse 60, temperature (!) 97.4 F (36.3 C), temperature source Oral, resp. rate 18, height 5\' 6"  (1.676 m), weight 84.1 kg, SpO2 95 %.  Filed Weights   05/03/19 0733 05/04/19 0603 05/05/19 0043  Weight: 85.9 kg 85.2 kg 84.1 kg   Labs & Radiologic  Studies    CBC Recent Labs    05/02/19 1813 05/03/19 0503  WBC 8.3 7.5  NEUTROABS 5.9  --   HGB 13.2 12.9*  HCT 40.9 39.6  MCV 94.2 92.5  PLT 218 161   Basic Metabolic Panel Recent Labs    05/04/19 0517 05/05/19 0411  NA 142 140  K 3.9 4.2  CL 101 100  CO2 30 28  GLUCOSE 113* 105*  BUN 20 26*  CREATININE 1.36* 1.29*  CALCIUM 9.5 9.3   Liver Function Tests Recent Labs    05/02/19 1813  AST 20  ALT 14  ALKPHOS 81  BILITOT 0.7  PROT 7.0  ALBUMIN 3.4*   Thyroid Function Tests Recent Labs    05/03/19 1438  TSH 2.004   _____________  Dg Chest 2 View  Result Date: 05/03/2019 CLINICAL DATA:  Shortness of breath for 3 days, CHF, COPD, atrial fibrillation, coronary artery disease post MI and CABG, type II diabetes mellitus, hypertension EXAM: CHEST - 2 VIEW COMPARISON:  01/13/2019 FINDINGS: LEFT subclavian AICD with leads projecting at RIGHT atrium, RIGHT ventricle, and coronary sinus. Enlargement of cardiac silhouette post CABG. Atherosclerotic calcification aorta. Pulmonary vascular congestion without gross pulmonary edema. Mild central peribronchial thickening. Persistent bibasilar effusions and atelectasis greater on RIGHT. No pneumothorax. Bones demineralized. IMPRESSION: Enlargement of cardiac silhouette post CABG and AICD with pulmonary vascular congestion.  Small bibasilar pleural effusions and atelectasis greater on RIGHT. No definite acute infiltrate. Electronically Signed   By: Lavonia Dana M.D.   On: 05/03/2019 07:59   Disposition   Pt is being discharged home today in good condition.  Follow-up Plans & Appointments   Follow-up Information    [facility]. Go on 05/16/2019.   Specialty: Pulmonology Why: 7/22 at 3pm  Contact information: [place] [address] [facility].   Why: Please expect a call regarding your follow-up  appointment.  Expect this appointment to be in the next 7 to 10 days Contact information: [place] [address] [phone]         Discharge Instructions    Call MD for:  difficulty breathing, headache or visual disturbances   Complete by: As directed    Call MD for:  extreme fatigue   Complete by: As directed    Call MD for:  hives   Complete by: As directed    Call MD for:  persistant dizziness or light-headedness   Complete by: As directed    Call MD for:  persistant nausea and vomiting   Complete by: As directed    Call MD for:  redness, tenderness, or signs of infection (pain, swelling, redness, odor or green/yellow discharge around incision site)   Complete by: As directed    Call MD for:  severe uncontrolled pain   Complete by: As directed    Call MD for:  temperature >100.4   Complete by: As directed    Diet - low sodium heart healthy   Complete by: As directed    Discharge instructions   Complete by: As directed    Our office will be calling you with date and time of your follow-up appointment.  Please continue with your current Coumadin regimen.  Please follow with our office in [facility] for your INR check 06/04/2019.   Increase activity slowly   Complete by: As directed      Discharge Medications   Allergies as of 05/05/2019      Reactions   Adhesive [tape] Other (See Comments)   PATIENT'S SKIN TEARS AND BRUISES VERY EASILY- IS TAKING COUMADIN!!   Aldactone [spironolactone] Other (See Comments)   Hyperkalemia  reported by Dr. [name] - pt is currently taking 12.5 mg daily -November 2017 per medication list by same MD      Medication List    TAKE these medications   acetaminophen 500 MG tablet Commonly known as: TYLENOL Take 1,000 mg by mouth at bedtime as needed for mild pain or moderate pain.   albuterol (2.5 MG/3ML) 0.083% nebulizer solution Commonly known as: PROVENTIL USE 1 VIAL VIA NEBULIZER 3 TIMES  A DAY AS DIRECTED. DX:J44.9 What changed: See the new instructions.   amiodarone 200 MG tablet Commonly known as: PACERONE Take 1 tablet (200 mg total) by mouth daily. What changed:   how much to take  when to take this   atorvastatin 80 MG tablet Commonly known as: LIPITOR TAKE 1 TABLET (80 MG TOTAL) BY MOUTH DAILY. What changed: See the new instructions.   carvedilol 3.125 MG tablet Commonly known as: COREG Take 1 tablet (3.125 mg total) by mouth 2 (two) times daily with a meal. What changed:   medication strength  when to take this  additional instructions   cholecalciferol 1000 units tablet  Commonly known as: VITAMIN D Take 2,000 Units by mouth every evening.   dextromethorphan-guaiFENesin 30-600 MG 12hr tablet Commonly known as: MUCINEX DM Take 1 tablet by mouth 2 (two) times daily as needed for cough.   feeding supplement (ENSURE ENLIVE) Liqd Take 237 mLs by mouth 2 (two) times daily between meals.   ferrous sulfate 325 (65 FE) MG tablet Take 325 mg by mouth every evening.   fluticasone 50 MCG/ACT nasal spray Commonly known as: FLONASE SPRAY 2 SPRAYS INTO EACH NOSTRIL EVERY DAY What changed: See the new instructions.   furosemide 40 MG tablet Commonly known as: LASIX Take 1 tablet (40 mg total) by mouth daily.   levothyroxine 100 MCG tablet Commonly known as: SYNTHROID Take 100 mcg by mouth daily before breakfast.   losartan 25 MG tablet Commonly known as: COZAAR Take 1 tablet (25 mg total) by mouth daily. Start taking on: May 06, 2019   Potassium Chloride ER 20 MEQ Tbcr Take 20 mEq by mouth daily.   PRESCRIPTION MEDICATION See admin instructions. CPAP- At bedtime   PRESERVISION AREDS PO Take 1 capsule by mouth 2 (two) times daily.   Senokot 8.6 MG tablet Generic drug: senna Take 1 tablet by mouth 2 (two) times daily. HOLD FOR LOOSE STOOLS   traMADol 50 MG tablet Commonly known as: ULTRAM Take 50 mg by mouth at bedtime.   Trelegy  Ellipta 100-62.5-25 MCG/INH Aepb Generic drug: Fluticasone-Umeclidin-Vilant INHALE 1 PUFF INTO LUNGS ONCE A DAY What changed: See the new instructions.   vitamin B-12 1000 MCG tablet Commonly known as: CYANOCOBALAMIN Take 1,000 mcg by mouth daily.   warfarin 5 MG tablet Commonly known as: COUMADIN Take as directed. If you are unsure how to take this medication, talk to your nurse or doctor. Original instructions: Take as directed by Coumadin Clinic What changed:   how much to take  how to take this  when to take this  additional instructions   Xultophy 100-3.6 UNIT-MG/ML Sopn Generic drug: Insulin Degludec-Liraglutide Inject 14 Units into the skin every morning.        Acute coronary syndrome (MI, NSTEMI, STEMI, etc) this admission?: No.    Outstanding Labs/Studies   BMET  Duration of Discharge Encounter   Greater than 30 minutes including physician time.  Signed, Kathyrn Drown, NP 05/05/2019, 10:02 AM  Personally seen and examined. Agree with above.   Primary Cardiologist: Larae Grooms, MD   Subjective   Breathing is improved, no chest pain, minimal chronic cough.  Inpatient Medications    Scheduled Meds:  albuterol  2.5 mg Nebulization TID   amiodarone  400 mg Oral BID   atorvastatin  80 mg Oral q1800   carvedilol  3.125 mg Oral BID WC   cholecalciferol  2,000 Units Oral QPM   feeding supplement (ENSURE ENLIVE)  237 mL Oral BID BM   ferrous sulfate  325 mg Oral QPM   fluticasone  2 spray Each Nare Daily   fluticasone furoate-vilanterol  1 puff Inhalation Daily   And   umeclidinium bromide  1 puff Inhalation Daily   furosemide  40 mg Intravenous BID   insulin aspart  0-15 Units Subcutaneous TID WC   insulin aspart  0-5 Units Subcutaneous QHS   Insulin Degludec-Liraglutide  14 Units Subcutaneous q morning - 10a   levothyroxine  100 mcg Oral QAC breakfast   losartan  12.5 mg Oral Daily   multivitamin  1 tablet Oral BID     potassium chloride  20 mEq  Oral BID   senna  1 tablet Oral BID   sodium chloride flush  3 mL Intravenous Q12H   vitamin B-12  1,000 mcg Oral Daily   Warfarin - Pharmacist Dosing Inpatient   Does not apply q1800   Continuous Infusions:  sodium chloride     PRN Meds: sodium chloride, acetaminophen, calcium carbonate, dextromethorphan-guaiFENesin, ondansetron (ZOFRAN) IV, sodium chloride flush, traMADol   Vital Signs          Vitals:   05/04/19 2029 05/05/19 0043 05/05/19 0544 05/05/19 0714  BP:   113/77   Pulse:   94   Resp:   18   Temp:   (!) 97.4 F (36.3 C)   TempSrc:   Oral   SpO2: 96%  91% 94%  Weight:  84.1 kg    Height:        Intake/Output Summary (Last 24 hours) at 05/05/2019 0846 Last data filed at 05/05/2019 0500    Gross per 24 hour  Intake 723 ml  Output 800 ml  Net -77 ml   Last 3 Weights 05/05/2019 05/04/2019 05/03/2019  Weight (lbs) 185 lb 7.9 oz 187 lb 12.8 oz 189 lb 6.4 oz  Weight (kg) 84.14 kg 85.186 kg 85.911 kg  Some encounter information is confidential and restricted. Go to Review Flowsheets activity to see all data.      Telemetry    Ventricular paced sinus rhythm- Personally Reviewed  ECG    No new- Personally Reviewed  Physical Exam  Elderly GEN:No acute distress.   Neck:No JVD Cardiac:RRR, no murmurs, rubs, or gallops.  Respiratory:Clear to auscultation bilaterally. DJ:MEQA, nontender, non-distended  MS:No edema; No deformity.  TED hose Neuro:Nonfocal  Psych: Normal affect   Labs    High Sensitivity Troponin:   Last Labs   No results for input(s): TROPONINIHS in the last 720 hours.      Cardiac Enzymes Last Labs   No results for input(s): TROPONINI in the last 168 hours.    Last Labs   No results for input(s): TROPIPOC in the last 168 hours.     Chemistry Last Labs         Recent Labs  Lab 05/02/19 1813 05/03/19 0503 05/04/19 0517 05/05/19 0411  NA 139 140 142 140   K 4.5 3.9 3.9 4.2  CL 102 104 101 100  CO2 28 26 30 28   GLUCOSE 136* 92 113* 105*  BUN 17 15 20  26*  CREATININE 1.25* 1.26* 1.36* 1.29*  CALCIUM 9.2 9.0 9.5 9.3  PROT 7.0  --   --   --   ALBUMIN 3.4*  --   --   --   AST 20  --   --   --   ALT 14  --   --   --   ALKPHOS 81  --   --   --   BILITOT 0.7  --   --   --   GFRNONAA 53* 52* 48* 51*  GFRAA >60 >60 55* 59*  ANIONGAP 9 10 11 12        Hematology Last Labs       Recent Labs  Lab 05/02/19 1813 05/03/19 0503  WBC 8.3 7.5  RBC 4.34 4.28  HGB 13.2 12.9*  HCT 40.9 39.6  MCV 94.2 92.5  MCH 30.4 30.1  MCHC 32.3 32.6  RDW 14.8 14.8  PLT 218 194      BNP Last Labs      Recent Labs  Lab 05/02/19 1813  BNP 1,255.8*       DDimer  Last Labs   No results for input(s): DDIMER in the last 168 hours.     Radiology    Imaging Results (Last 48 hours)  No results found.    Cardiac Studies   Echo EF 30%  Patient Profile     85 y.o. male CAD s/p CABG in 2000 with patent grafts on cardiac catheterization in 2009 and negative Myoview in 3212, chronic systolic CHF/ischemic cardiomyopathy with EF of 30-35%, persistent atrial fibrillation s/p PVI ablation in 2017 on Amiodarone and Coumadin, hypertension, hyperlipidemia, diabetes mellitus, CVA, COPD, hypothyroidism, PAD with left subclavian stenosis, obstructive sleep apnea on CPAP, and CKD, who was admitted from our office on 05/02/2019 with acute on chronic systolic CHF.  Assessment & Plan    Acute on chronic systolic heart failure - Good overall diuresis.  Weight is down to 185 pounds.  Admit weight 191 - Losartan added-I will increase to 25 mg once a day.  Not sure if he will have the room for [facility] given blood pressure. -Keep carvedilol at 3.125 mg twice a day-blood pressure 113/77-102/66  Paroxysmal atrial fibrillation - Ventricular pacing, sinus rhythm noted on telemetry.  Previously on amiodarone 100 mg a day over the past 2 weeks prior to  admission.  He was reloaded with 400 mg twice a day here.   -At home, I will transition amiodarone back to 200 mg once a day.  Chronic anticoagulation -Coumadin-INR 2.5.  Coronary artery disease -CABG 2000, 2009 catheterization patent grafts.  No aspirin since he is on Coumadin.  High intensity statin and beta-blocker.  COPD -Appreciate pulmonary consult during this hospitalization to optimize regimen.  No changes made.  Home medications reviewed.  Close follow-up needed by pulmonary for continued COPD management and work-up of pulmonary nodule.  Biventricular ICD - Previously reported atrial fibrillation since the end of May.  It appears that he has P waves currently.  Perhaps the increase in the amiodarone helped.  Defibrillator function has been turned off a little over a year ago.  Type 2 diabetes with hypertension - Per primary team.  Hypothyroidism -Synthroid  Chronic kidney disease stage III - Morning 1.29.  He is ready for discharge, feels comfortable with this.  We will make sure that his wife understands instructions that are new.  Close follow-up with [name] or APP team  For questions or updates, please contact [place] Please consult [email or web address] for contact info under        Signed, Candee Furbish, MD

## 2019-05-05 NOTE — Progress Notes (Signed)
Patient needed 30 day rx sent to pharmacy to pick up meds this weekend. Richardson Dopp, PA-C    05/05/2019 2:01 PM

## 2019-05-07 ENCOUNTER — Telehealth: Payer: Self-pay | Admitting: Interventional Cardiology

## 2019-05-07 ENCOUNTER — Other Ambulatory Visit: Payer: Self-pay

## 2019-05-07 ENCOUNTER — Ambulatory Visit (INDEPENDENT_AMBULATORY_CARE_PROVIDER_SITE_OTHER): Payer: HMO

## 2019-05-07 DIAGNOSIS — I5022 Chronic systolic (congestive) heart failure: Secondary | ICD-10-CM

## 2019-05-07 DIAGNOSIS — Z95810 Presence of automatic (implantable) cardiac defibrillator: Secondary | ICD-10-CM

## 2019-05-07 NOTE — Telephone Encounter (Signed)
lpmtcb 7/13 

## 2019-05-07 NOTE — Patient Outreach (Signed)
  [facility]) Care Management Chronic Special Needs Program  05/07/2019  Name: CRISTIAN DAVITT DOB: 1934/10/10  MRN: [id number]  Mr. Lyndall Windt is enrolled in a chronic special needs plan for Heart Failure. Reviewed and updated care plan.  Client admitted to hospital with acute on chronic heart failure on 05/02/2019. Client discharged to home on 05/05/2019. Client is actively engaged with [facility] for complex community care management.  Goals Addressed            This Visit's Progress   . Client will not be readmitted within 30 days (C-SNP)discharge date 05/05/2019       Attend follow up appointments as scheduled. Monitor salt and fluid volume as recommended. Weigh self daily. Take medications as prescribed.        Plan: [facility] Management to continue to follow for complex community care management. RNCM to continue to follow as client's C-SNP Care management coordinator.    Emma [phone]     .

## 2019-05-07 NOTE — Telephone Encounter (Signed)
New message    Pt c/o Shortness Of Breath: STAT if SOB developed within the last 24 hours or pt is noticeably SOB on the phone  1. Are you currently SOB (can you hear that pt is SOB on the phone)? No   2. How long have you been experiencing SOB? Patient's wife states that started yesterday afternoon around 2 pm   3. Are you SOB when sitting or when up moving around? Both   4. Are you currently experiencing any other symptoms? Patient is vomiting up blood

## 2019-05-07 NOTE — Telephone Encounter (Signed)
Spoke with Pt's wife.  Pt was recently discharged from hospital.  Last night Pt started to have some difficulty breathing with cough and some "gurgling".  Pt c/o chills.  Pt's wife states she has been monitoring his temperature but he has not had one.  Pt then started throwing up blood.  Wife called 911.  Per wife 911 stabilized Pt at home with his home oxygen.  Per 911 they suspected he had had a small vein rupture that caused Pt to throw up blood.    No c/o of chest pain.  Wife states Pt did not have chest pain prior to last MI.  Pt's symptoms are tired/dizzy.  Pt's wife denies those symptoms.  Made sooner f/u appt for Pt on 05/09/2019.    Advised wife to call 911 if Pt has any further issues with bleeding or develops worsening sob.  Wife indicates understanding.

## 2019-05-08 ENCOUNTER — Telehealth: Payer: Self-pay | Admitting: Physician Assistant

## 2019-05-08 ENCOUNTER — Telehealth: Payer: Self-pay

## 2019-05-08 NOTE — Progress Notes (Signed)
Cardiology Office Note    Date:  05/09/2019   ID:  [name], DOB [birth year], MRN [id number]  PCP:  [name], MD  Primary Cardiologist: Larae Grooms, MD  Primary Electrophysiologist:  Thompson Grayer, MD  Chief Complaint: Hospital follow up   History of Present Illness:   [name] is a 85 y.o. male with a hx of CAD s/p CABG in 2000 with patent grafts on cardiac catheterization in 2009 and negative Myoview in 7106, chronic systolic CHF/ischemic cardiomyopathy with EF of 30-35%, persistent atrial fibrillation s/p PVI ablation in 2017 on Amiodarone and Coumadin, hypertension, hyperlipidemia, diabetes mellitus, CVA, COPD, hypothyroidism, PAD with left subclavian stenosis, obstructive sleep apnea on CPAP and CKD seen for hospital follow up.   Recently seen in atrial fibrillation clinic secondary to recurrent atrial fibrillation noted on his device.  His amiodarone was increased with restoration of normal sinus rhythm. However  thoracic impedance was decreased indicating fluid volume overload.  His device also demonstrated recurrent atrial fibrillation leading to further increase in amiodarone.   Admitted from clinic 05/02/19 with acute CHF exacerbation. Excellent diuresis. Discharge weight was 185lb. Added Losartan. Decreased amiodarone to 200mg  daily. He was V paced with underlying rhythm of sinus during admission. He was also seen by pulmonary >> continued current COPD treatment with recommendation of outpatient follow up.   Here today for follow up. Patient was unable slept Sunday and Monday at night due to intermittent dyspnea but slept well last night. Coughing up blood>> EMS personal thought it was due to hard cough. Per wife "dark sputum". His breathing is improving weight is stable 191-192lb on home scale. Mild LE edema. Compliant with low sodium diet. Weight in clinic 194 today.    Past Medical History:  Diagnosis Date  . Atrial fibrillation ([place])    persistent,  previously seen at [facility] and placed on amiodarone  . Benign prostatic hypertrophy   . CAD (coronary artery disease)    multivessel s/p inferolateral wall MI with subsequent CABG 11/1998.  Cath 2009 with Patent grafts  . Cleft palate   . COPD with emphysema ([place]) 04/01/2010  . DM (diabetes mellitus), type 2 ([place])   . Dyspnea   . GERD (gastroesophageal reflux disease)   . HTN (hypertension)   . Hyperlipidemia   . Hypothyroidism   . Iron deficiency anemia   . Ischemic dilated cardiomyopathy ([place])    EF 35-40% by MUGA 6/11  . Myocardial infarction ([place])   . Nasal septal deviation   . Nephrolithiasis   . OSA (obstructive sleep apnea)   . PAF (paroxysmal atrial fibrillation) ([place])   . Peripheral arterial disease ([facility])    left subclavian artery stenosis  . PNA (pneumonia)   . Psoriasis   . Seborrheic keratosis   . Stroke ([place])   . Systolic congestive heart failure ([place]) 2009   s/p BiV ICD implantation by Dr Leonia Reeves (MDT)    Past Surgical History:  Procedure Laterality Date  . BI-VENTRICULAR IMPLANTABLE CARDIOVERTER DEFIBRILLATOR  (CRT-D)  10-08-08; 11-06-2013   Dr Leonia Reeves (MDT) implant for primary prevention; gen change to MDT VivaXT CRTD by Dr Rayann Heman  . BIV ICD GENERTAOR CHANGE OUT N/A 11/06/2013   Procedure: BIV ICD GENERTAOR CHANGE OUT;  Surgeon: Coralyn Mark, MD;  Location: [facility] CATH LAB;  Service: Cardiovascular;  Laterality: N/A;  . c-spine surgery    . CARDIOVERSION N/A 04/29/2016   Procedure: CARDIOVERSION;  Surgeon: Pixie Casino, MD;  Location: [facility];  Service: Cardiovascular;  Laterality: N/A;  . CARPAL TUNNEL RELEASE    . CATARACT EXTRACTION    . CORONARY ARTERY BYPASS GRAFT     LIMA to LAD, SVG to OM, SVG to diagonal  . ELECTROPHYSIOLOGIC STUDY N/A 06/11/2016   Procedure: Atrial Fibrillation Ablation;  Surgeon: Thompson Grayer, MD;  Location: [facility] CV LAB;  Service: Cardiovascular;  Laterality: N/A;  . left cleft palate and left cleft lip repair      Current  Medications: Prior to Admission medications   Medication Sig Start Date End Date Taking? Authorizing Provider  acetaminophen (TYLENOL) 500 MG tablet Take 1,000 mg by mouth at bedtime as needed for mild pain or moderate pain.    [provider]  albuterol (PROVENTIL) (2.5 MG/3ML) 0.083% nebulizer solution USE 1 VIAL VIA NEBULIZER 3 TIMES A DAY AS DIRECTED. DX:J44.9 Patient taking differently: Take 2.5 mg by nebulization See admin instructions. Nebulize 2.5 mg (1 vial) one to three times a day 08/21/18   Chesley Mires, MD  amiodarone (PACERONE) 200 MG tablet Take 1 tablet (200 mg total) by mouth daily. 05/05/19   Tommie Raymond, NP  atorvastatin (LIPITOR) 80 MG tablet TAKE 1 TABLET (80 MG TOTAL) BY MOUTH DAILY. Patient taking differently: Take 80 mg by mouth daily.  07/10/18   Jettie Booze, MD  carvedilol (COREG) 3.125 MG tablet Take 1 tablet (3.125 mg total) by mouth 2 (two) times daily with a meal. 05/05/19   Kathyrn Drown D, NP  cholecalciferol (VITAMIN D) 1000 units tablet Take 2,000 Units by mouth every evening.    [provider]  dextromethorphan-guaiFENesin (MUCINEX DM) 30-600 MG 12hr tablet Take 1 tablet by mouth 2 (two) times daily as needed for cough.    [provider]  feeding supplement, ENSURE ENLIVE, (ENSURE ENLIVE) LIQD Take 237 mLs by mouth 2 (two) times daily between meals. 10/07/18   Ghimire, Henreitta Leber, MD  ferrous sulfate 325 (65 FE) MG tablet Take 325 mg by mouth every evening.     [provider]  fluticasone (FLONASE) 50 MCG/ACT nasal spray SPRAY 2 SPRAYS INTO EACH NOSTRIL EVERY DAY Patient taking differently: Place 2 sprays into both nostrils every morning.  08/16/18   Chesley Mires, MD  furosemide (LASIX) 40 MG tablet Take 1 tablet (40 mg total) by mouth daily. 01/14/19   Oswald Hillock, MD  Insulin Degludec-Liraglutide (XULTOPHY) 100-3.6 UNIT-MG/ML SOPN Inject 14 Units into the skin every morning.     [provider]   levothyroxine (SYNTHROID, LEVOTHROID) 100 MCG tablet Take 100 mcg by mouth daily before breakfast.  09/23/15   [provider]  losartan (COZAAR) 25 MG tablet Take 1 tablet (25 mg total) by mouth daily. 05/06/19   Richardson Dopp T, PA-C  Multiple Vitamins-Minerals (PRESERVISION AREDS PO) Take 1 capsule by mouth 2 (two) times daily.     [provider]  potassium chloride 20 MEQ TBCR Take 20 mEq by mouth daily. 07/15/18   Thurnell Lose, MD  PRESCRIPTION MEDICATION See admin instructions. CPAP- At bedtime    [provider]  senna (SENOKOT) 8.6 MG tablet Take 1 tablet by mouth 2 (two) times daily. HOLD FOR LOOSE STOOLS    [provider]  traMADol (ULTRAM) 50 MG tablet Take 50 mg by mouth at bedtime.  06/07/18   [provider]  TRELEGY ELLIPTA 100-62.5-25 MCG/INH AEPB INHALE 1 PUFF INTO LUNGS ONCE A DAY Patient taking differently: Take 1 puff by mouth every morning.  07/06/18   Sood,  Vineet, MD  vitamin B-12 (CYANOCOBALAMIN) 1000 MCG tablet Take 1,000 mcg by mouth daily.    [provider]  warfarin (COUMADIN) 5 MG tablet Take as directed by [facility]mad[facility] Clinic Patient taking differently: Take 5 mg by mouth daily after supper.  03/22/19   Jettie Booze, MD    Allergies:   Adhesive [tape] and Aldactone [spironolactone]   Social History   Socioeconomic History  . Marital status: Married    Spouse name: Not on file  . Number of children: Not on file  . Years of education: Not on file  . Highest education level: Not on file  Occupational History  . Occupation: retired    Comment: Engineer, agricultural  Social Needs  . Financial resource strain: Not on file  . Food insecurity    Worry: Not on file    Inability: Not on file  . Transportation needs    Medical: Not on file    Non-medical: Not on file  Tobacco Use  . Smoking status: Former Smoker    Packs/day: 3.00    Years: 65.00    Pack years: 195.00    Types: Cigarettes    Quit date:  10/25/1998    Years since quitting: 20.5  . Smokeless tobacco: Never Used  Substance and Sexual Activity  . Alcohol use: No    Alcohol/week: 0.0 standard drinks    Comment: remote history of heavy alcohol use  . Drug use: No  . Sexual activity: Not on file  Lifestyle  . Physical activity    Days per week: Not on file    Minutes per session: Not on file  . Stress: Not on file  Relationships  . Social Herbalist on phone: Not on file    Gets together: Not on file    Attends religious service: Not on file    Active member of club or organization: Not on file    Attends meetings of clubs or organizations: Not on file    Relationship status: Not on file  Other Topics Concern  . Not on file  Social History Narrative   Lives [place]   Retired     Family History:  The patient's family history includes Asthma in his father; Heart attack in his brother; Heart disease in his brother; Hypertension in his brother, father, mother, and sister; Stroke in his father.   ROS:   Please see the history of present illness.    ROS All other systems reviewed and are negative.   PHYSICAL EXAM:   VS:  BP 108/60   Pulse 73   Ht 5\' 6"  (1.676 m)   Wt 194 lb 12.8 oz (88.4 kg)   SpO2 100% Comment: at rest  BMI 31.44 kg/m    GEN: Well nourished, well developed, in no acute distress  HEENT: normal  Neck: no JVD, carotid bruits, or masses Cardiac: RRR; no murmurs, rubs, or gallops, Tace BL LE edema  Respiratory:  Faint rales,  GI: soft, nontender, nondistended, + BS MS: no deformity or atrophy  Skin: warm and dry, no rash Neuro:  Alert and Oriented x 3, Strength and sensation are intact Psych: euthymic mood, full affect  Wt Readings from Last 3 Encounters:  05/09/19 194 lb 12.8 oz (88.4 kg)  05/05/19 185 lb 7.9 oz (84.1 kg)  05/02/19 195 lb (88.5 kg)      Studies/Labs Reviewed:   EKG:  EKG is not ordered today.   Recent Labs: 09/20/2018: Magnesium 2.1  05/02/2019: ALT 14; B  Natriuretic Peptide 1,255.8 05/03/2019: Hemoglobin 12.9; Platelets 194; TSH 2.004 05/05/2019: BUN 26; Creatinine, Ser 1.29; Potassium 4.2; Sodium 140   Lipid Panel    Component Value Date/Time   CHOL 137 11/12/2014 0921   TRIG 95 11/12/2014 0921   HDL 50 11/12/2014 0921   CHOLHDL 2.6 11/29/2008 0330   VLDL 10 11/29/2008 0330   LDLCALC 68 11/12/2014 0921    Additional studies/ records that were reviewed today include:   Echocardiogram: 08/2018 Study Conclusions  - Left ventricle: The cavity size was severely dilated. Systolic   function was moderately to severely reduced. The estimated   ejection fraction was in the range of 30% to 35%. Severe diffuse   hypokinesis with distinct regional wall motion abnormalities.   There is severe hypokinesis to akinesis of the inferolateral and   inferior myocardium. There is hypokinesis of the lateral   myocardium. There is akinesis of the apical septal, apical   anterior and apical myocardium. - Aortic valve: Mildly to moderately calcified annulus. Trileaflet;   normal thickness, moderately calcified leaflets. Valve area   (VTI): 2.55 cm^2. Valve area (Vmax): 2.7 cm^2. Valve area   (Vmean): 2.13 cm^2. - Aorta: Aortic root dimension: 39 mm (ED). - Aortic root: The aortic root was mildly dilated. - Mitral valve: There was mild regurgitation. Valve area by   continuity equation (using LVOT flow): 2.29 cm^2. - Left atrium: The atrium was severely dilated. - Right atrium: The atrium was mildly dilated. - Pulmonic valve: There was trivial regurgitation. - Pulmonary arteries: PA peak pressure: 45 mm Hg (S).  Impressions:  - The right ventricular systolic pressure was increased consistent   with moderate pulmonary hypertension.  ASSESSMENT & PLAN:    1. Paroxysmal atrial fibrillation  -Regular rate and rhythm by exam. Continue amiodarone 200mg , coreg 3.125mg  BID. On coumadin for anticoagulation. See below.   2. Chronic systolic CHF /  ICM - discharge weight was 185lb weight today in clinic 194 lb. However, weight has been stable 191-192lb on home scale (did not bring reading). Noted trace edema and faint rales bilaterally. OptivolThoracic impedance normal on check 7/13. He was able to sleep well last night. Advise continue low-sodium diet.  Try compression stocking and leg elevation.  Take Lasix 20 mg this afternoon, and also continue take as needed for lower extremity edema/shortness of breath.  Continue 40 mg of Lasix daily in the morning.  Continue Coreg and losartan.  Blood pressure soft for addition of Entresto.  3. CAD s/p CABG in 2000 -No angina.  Continue statin and beta-blocker.  4. BiV ICD - followed by Dr. Rayann Heman   5. OSA on CPAP  6.  Hemoptysis / long term anticoagulation  -Patient reports dark sputum for past 3 days, especially in the morning.  INR of 4.1. Coumadin adjusted by pharmacist. Also has appointment with pulmonary next week.   Plan discussed with DOD Dr. Johnsie Cancel.    Medication Adjustments/Labs and Tests Ordered: Current medicines are reviewed at length with the patient today.  Concerns regarding medicines are outlined above.  Medication changes, Labs and Tests ordered today are listed in the Patient Instructions below. Patient Instructions  Medication Instructions:  Your physician recommends that you continue on your current medications as directed. Please refer to the Current Medication list given to you today, but if you continue to have lower extremity edema, swelling or shortness of breath, you can take an 1/2 tablet of the Lasix in the afternoon.  If  you need a refill on your cardiac medications before your next appointment, please call your pharmacy.   Lab work: None ordered  If you have labs (blood work) drawn today and your tests are completely normal, you will receive your results only by: [name] MyChart Message (if you have MyChart) OR . A paper copy in the mail If you have any lab test  that is abnormal or we need to change your treatment, we will call you to review the results.  Testing/Procedures: None ordered  Follow-Up: At [facility], you and your health needs are our priority.  As part of our continuing mission to provide you with exceptional heart care, we have created designated Provider Care Teams.  These Care Teams include your primary Cardiologist (physician) and Advanced Practice Providers (APPs -  Physician Assistants and Nurse Practitioners) who all work together to provide you with the care you need, when you need it. [name] Keep the scheduled follow-up appointment with [name], PA-C 05/17/2019 at 3:30.  Any Other Special Instructions Will Be Listed Below (If Applicable).     Jarrett Soho, [place]  05/09/2019 10:44 AM    [facility] [address], [place], [place]  [address] Phone: [phone]; Fax: [phone]

## 2019-05-08 NOTE — Progress Notes (Signed)
EPIC Encounter for ICM Monitoring  Patient Name: [name] is a 84 y.o. male Date: 05/08/2019 Primary Care Physican: [name], MD Primary [place] Electrophysiologist: Allred BiV Pacing:16.9%  04/18/2019 Weight:187-191 lbs 04/30/2019 Weight: 191 lbs 05/08/2019 Weight: Unknown  Estimated remaining battery longevity 6 months.  Clinical Status (30-April-2019 to 07-May-2019)  AT/AF                  28 episodes  Atrial Pacing       15.7%  Time in AT/AF     28 hr/day (99.3%)  Longest AT/AF    16 hours  OBSERVATIONS (12)  VF Detection is OFF.  All VF therapies are Off.  Alert:  RV bipolar lead impedance warning on 12-Jan-2019.  AT/AF >= 6 hr for 8 days.  Avg. Ventricular Rate >= 100 bpm during AT/AF (>= 6 hr) for 2 days.  V. Pacing less than 90%. Unable to measure LV thresholds in last 7 days.  Night heart rate over 85 bpm for 7 days.  Patient Activity less than 1 hr/day for 1 weeks.  Longest ventricular sensing episode since the last session is greater than 60 seconds.  Ventricular sensing episodes averaged 18.4 hr/day since the last session.  No Wavelet template. Collect one manually on the programmer.           Spoke with wife.  Patient hospitalized from 7/8 - 7/11 for acute on chronic systolic heart failure.  She said patient is really tired and sleeping a lot.  He is not having any fluid symptoms today.          OptivolThoracic impedance normal but was suggestive of possible fluid accumulation since 04/15/2019 - 05/02/2019 which correlates with hospitalization.  Prescribed:Furosemide40 mg take1 tablet daily.  Labs: 05/05/2019 Creatinine 1.29, BUN 26, Potassium 4.2, Sodium 140, GFR 51-59 05/04/2019 Creatinine 1.36, BUN 20, Potassium 3.9, Sodium 142, GFR 48-55  05/03/2019 Creatinine 1.26, BUN 17, Potassium 4.5, Sodium 139, GFR 52->60  05/02/2019 Creatinine 1.25, BUN 17, Potassium 4.5, Sodium 139, GFR 53->60  01/13/2019  Creatinine1.11, BUN22, Potassium4.2, Sodium138, GFR>60 01/12/2019 Creatinine1.35, BUN22, Potassium3.6, Sodium138, [id number]  Acomplete set of results can be found in Results Review.  Recommendations:     Follow-up plan: ICM clinic phone appointment on7/27/2020.  He has office visit with [place], [place] 05/09/2019  Copy of ICM check sent to Dr.Allred and Leanor Kail, PA since patient has office visit with him tomorrow.  3 month ICM trend: 05/07/2019    AT/AF    1 Year ICM trend:       Rosalene Billings, RN 05/08/2019 3:07 PM

## 2019-05-08 NOTE — Telephone Encounter (Signed)
Follow up  ° ° °Patient is returning call.  °

## 2019-05-08 NOTE — Telephone Encounter (Signed)
New Message ° ° ° °Left message to confirm appt and answer covid questions  °

## 2019-05-08 NOTE — Telephone Encounter (Signed)
Follow up        COVID-19 Pre-Screening Questions:   In the past 7 to 10 days have you had a cough,  shortness of breath, headache, congestion, fever (100 or greater) body aches, chills, sore throat, or sudden loss of taste or sense of smell? Cough from a medical condition but nothing out of the norm   Have you been around anyone with known Covid 19. NO  Have you been around anyone who is awaiting Covid 19 test results in the past 7 to 10 days? NO  Have you been around anyone who has been exposed to Covid 19, or has mentioned symptoms of Covid 19 within the past 7 to 10 days? NO  Pts wife says he is hard of hearing and she will need to be there for assistance. Pts wife answered NO to all screening questions   If you have any concerns/questions about symptoms patients report during screening (either on the phone or at [facility]). Contact the provider seeing the patient or DOD for further guidance.  If neither are available contact a member of the leadership team.

## 2019-05-08 NOTE — Telephone Encounter (Signed)
Margarita Grizzle short told the pt wife they do not need to send a transmission for 05-10-2019 since they sent one today. It just a battery check

## 2019-05-09 ENCOUNTER — Encounter: Payer: Self-pay | Admitting: Physician Assistant

## 2019-05-09 ENCOUNTER — Other Ambulatory Visit: Payer: Self-pay

## 2019-05-09 ENCOUNTER — Ambulatory Visit: Payer: HMO | Admitting: Physician Assistant

## 2019-05-09 ENCOUNTER — Ambulatory Visit (INDEPENDENT_AMBULATORY_CARE_PROVIDER_SITE_OTHER): Payer: HMO | Admitting: Pharmacist

## 2019-05-09 VITALS — BP 108/60 | HR 73 | Ht 66.0 in | Wt 194.8 lb

## 2019-05-09 DIAGNOSIS — I255 Ischemic cardiomyopathy: Secondary | ICD-10-CM

## 2019-05-09 DIAGNOSIS — I2510 Atherosclerotic heart disease of native coronary artery without angina pectoris: Secondary | ICD-10-CM

## 2019-05-09 DIAGNOSIS — I480 Paroxysmal atrial fibrillation: Secondary | ICD-10-CM

## 2019-05-09 DIAGNOSIS — R042 Hemoptysis: Secondary | ICD-10-CM | POA: Diagnosis not present

## 2019-05-09 DIAGNOSIS — I5022 Chronic systolic (congestive) heart failure: Secondary | ICD-10-CM | POA: Diagnosis not present

## 2019-05-09 DIAGNOSIS — Z7901 Long term (current) use of anticoagulants: Secondary | ICD-10-CM

## 2019-05-09 LAB — POCT INR: INR: 4.1 — AB (ref 2.0–3.0)

## 2019-05-09 NOTE — Patient Instructions (Addendum)
Medication Instructions:  Your physician recommends that you continue on your current medications as directed. Please refer to the Current Medication list given to you today, but if you continue to have lower extremity edema, swelling or shortness of breath, you can take an 1/2 tablet of the Lasix in the afternoon.  If you need a refill on your cardiac medications before your next appointment, please call your pharmacy.   Lab work: TODAY:  PRO BNP & BMET   If you have labs (blood work) drawn today and your tests are completely normal, you will receive your results only by: [name] MyChart Message (if you have MyChart) OR . A paper copy in the mail If you have any lab test that is abnormal or we need to change your treatment, we will call you to review the results.  Testing/Procedures: None ordered  Follow-Up: At [facility], you and your health needs are our priority.  As part of our continuing mission to provide you with exceptional heart care, we have created designated Provider Care Teams.  These Care Teams include your primary Cardiologist (physician) and Advanced Practice Providers (APPs -  Physician Assistants and Nurse Practitioners) who all work together to provide you with the care you need, when you need it. [name] Keep the scheduled follow-up appointment with [name], PA-C 05/17/2019 at 3:30.  Any Other Special Instructions Will Be Listed Below (If Applicable).

## 2019-05-09 NOTE — Patient Outreach (Signed)
  [facility]) Care Management Chronic Special Needs Program   05/09/2019  Name: [name], DOB: 1934-05-19  MRN: [id number]  The client was discussed in today's interdisciplinary care team meeting.  The following issues were discussed:  Client's needs, Changes in health status, Care Plan, Coordination of care and Care transitions  Participants present: Bary Castilla, RNCM; Mahlon Gammon, RNCM; Peter Garter, RNCM; Thea Silversmith, RNCM; Kelli Churn, RNCM; Dr. Marco Collie; Dr. Coralie Carpen  Plan:  [facility] to continue to follow for complex case management. RNCM will continue to follow as client's C-SNP Chronic care management coordinator.  Thea Silversmith, RN, MSN, [place] [phone]

## 2019-05-09 NOTE — Patient Instructions (Signed)
Do not take any coumadin today. Tomorrow take 1/2 tablet, then continue dosage 1 TABLET EVERY DAY.  Recheck 05/17/2019 in [place]

## 2019-05-10 ENCOUNTER — Ambulatory Visit (INDEPENDENT_AMBULATORY_CARE_PROVIDER_SITE_OTHER): Payer: HMO | Admitting: *Deleted

## 2019-05-10 DIAGNOSIS — Z95810 Presence of automatic (implantable) cardiac defibrillator: Secondary | ICD-10-CM

## 2019-05-10 LAB — CUP PACEART REMOTE DEVICE CHECK
Battery Remaining Longevity: 6 mo
Battery Voltage: 2.83 V
Brady Statistic AP VP Percent: 46.94 %
Brady Statistic AP VS Percent: 0.1 %
Brady Statistic AS VP Percent: 52.5 %
Brady Statistic AS VS Percent: 0.46 %
Brady Statistic RA Percent Paced: 46.63 %
Brady Statistic RV Percent Paced: 97.7 %
Date Time Interrogation Session: 20200716084224
HighPow Impedance: 46 Ohm
HighPow Impedance: 55 Ohm
Implantable Lead Implant Date: 20091215
Implantable Lead Implant Date: 20091215
Implantable Lead Implant Date: 20091215
Implantable Lead Location: 753858
Implantable Lead Location: 753859
Implantable Lead Location: 753860
Implantable Lead Model: 4196
Implantable Lead Model: 5076
Implantable Lead Model: 6947
Implantable Pulse Generator Implant Date: 20150113
Lead Channel Impedance Value: 4047 Ohm
Lead Channel Impedance Value: 418 Ohm
Lead Channel Impedance Value: 513 Ohm
Lead Channel Impedance Value: 532 Ohm
Lead Channel Impedance Value: 589 Ohm
Lead Channel Impedance Value: 931 Ohm
Lead Channel Pacing Threshold Amplitude: 0.875 V
Lead Channel Pacing Threshold Amplitude: 0.875 V
Lead Channel Pacing Threshold Amplitude: 1.75 V
Lead Channel Pacing Threshold Pulse Width: 0.4 ms
Lead Channel Pacing Threshold Pulse Width: 0.4 ms
Lead Channel Pacing Threshold Pulse Width: 0.6 ms
Lead Channel Sensing Intrinsic Amplitude: 0.625 mV
Lead Channel Sensing Intrinsic Amplitude: 11.25 mV
Lead Channel Setting Pacing Amplitude: 1.5 V
Lead Channel Setting Pacing Amplitude: 1.75 V
Lead Channel Setting Pacing Amplitude: 2.5 V
Lead Channel Setting Pacing Pulse Width: 0.6 ms
Lead Channel Setting Pacing Pulse Width: 1 ms
Lead Channel Setting Sensing Sensitivity: 0.3 mV

## 2019-05-10 LAB — BASIC METABOLIC PANEL
BUN/Creatinine Ratio: 22 (ref 10–24)
BUN: 37 mg/dL — ABNORMAL HIGH (ref 8–27)
CO2: 25 mmol/L (ref 20–29)
Calcium: 9.2 mg/dL (ref 8.6–10.2)
Chloride: 98 mmol/L (ref 96–106)
Creatinine, Ser: 1.68 mg/dL — ABNORMAL HIGH (ref 0.76–1.27)
GFR calc Af Amer: 43 mL/min/{1.73_m2} — ABNORMAL LOW (ref >59)
GFR calc non Af Amer: 37 mL/min/{1.73_m2} — ABNORMAL LOW (ref >59)
Glucose: 127 mg/dL — ABNORMAL HIGH (ref 65–99)
Potassium: 4.9 mmol/L (ref 3.5–5.2)
Sodium: 139 mmol/L (ref 134–144)

## 2019-05-10 LAB — PRO B NATRIURETIC PEPTIDE: NT-Pro BNP: 12014 pg/mL — ABNORMAL HIGH (ref 0–486)

## 2019-05-11 ENCOUNTER — Telehealth: Payer: Self-pay

## 2019-05-11 ENCOUNTER — Telehealth: Payer: Self-pay | Admitting: Interventional Cardiology

## 2019-05-11 DIAGNOSIS — E8770 Fluid overload, unspecified: Secondary | ICD-10-CM

## 2019-05-11 DIAGNOSIS — Z79899 Other long term (current) drug therapy: Secondary | ICD-10-CM

## 2019-05-11 MED ORDER — FUROSEMIDE 40 MG PO TABS: 40.0000 mg | ORAL_TABLET | Freq: Two times a day (BID) | ORAL | 1 refills | Status: DC

## 2019-05-11 NOTE — Telephone Encounter (Signed)
New message   Pt c/o medication issue:  1. Name of Medication: furosemide (LASIX) 40 MG tablet  2. How are you currently taking this medication (dosage and times per day)? Twice daily  3. Are you having a reaction (difficulty breathing--STAT)? No   4. What is your medication issue? Patient's wife states that there is a discrepancy in the dosage. Please call.

## 2019-05-11 NOTE — Telephone Encounter (Signed)
-----   Message from [place], [place] sent at 05/11/2019  9:32 AM EDT ----- Fluid marker and Scr elevated.   Stop Losartan. Refer to CHF clinic. Repeat BMET in 1 week.   Continue Lasix 40mg  in AM and take 20mg  in PM as needed.    ----- Message ----- From: Jettie Booze, MD Sent: 05/11/2019   9:17 AM EDT To: Bhavinkumar Bhagat, PA  If BP is controlled, OK to stop losartan.  OK to refer to CHF as well. Recheck BMet 1 week after stopping losartan.

## 2019-05-11 NOTE — Telephone Encounter (Signed)
      Signed          Added by: [x] Frederik Schmidt, RN  [] Hover for details ----- Message from Leanor Kail, [place] sent at 05/11/2019  9:32 AM EDT ----- Fluid marker and Scr elevated.   Stop Losartan. Refer to CHF clinic. Repeat BMET in 1 week.   Continue Lasix 40mg  in AM and take 20mg  in PM as needed.    ----- Message ----- From: Jettie Booze, MD Sent: 05/11/2019   9:17 AM EDT To: Bhavinkumar Bhagat, PA  If BP is controlled, OK to stop losartan.  OK to refer to CHF as well. Recheck BMet 1 week after stopping losartan.          Electronically signed by Frederik Schmidt, RN at 05/11/2019 9:47 AM     wife calling back stating that she wanted  to let us know that the pt has been taking Lasix 40 mg BID before his hospital admit and never reduced it after DC.  Do you want him to cut afternoon dose? Wife was told we will call back with further advice.

## 2019-05-11 NOTE — Telephone Encounter (Signed)
Lasix/ Med list was updated.

## 2019-05-11 NOTE — Telephone Encounter (Signed)
Notes recorded by Frederik Schmidt, RN on 05/11/2019 at 9:46 AM EDT  The patient's wife has been notified of the result and verbalized understanding. All questions (if any) were answered.  Frederik Schmidt, RN 05/11/2019 9:46 AM

## 2019-05-11 NOTE — Addendum Note (Signed)
Addended by: Jonathon Jordan on: 05/11/2019 12:47 PM   Modules accepted: Orders

## 2019-05-15 ENCOUNTER — Telehealth: Payer: Self-pay

## 2019-05-15 NOTE — Progress Notes (Signed)
History of Present Illness [name] is a 85 y.o. male former smoker ( Quit 2000) with a 195 pack year smoking history with OSA, COPD , DM, CKD III,  Chronic Heart Failure, PAFCAD S/P CABG 2000, and Bi Ventricular ICD. He was seen by Dr. Lynetta Mare in the hospital. He presents for hospital follow up. He is followed by Dr. Halford Chessman. He was last seen in the office 04/2018.  Synopsis of Hospitalization: Admit date: 05/02/2019 Discharge date: 05/05/2019 85 year old male presents to Cardiology Office on 7/8 with increased shortness of breath with minimal activity in which has progressively worsened since 7/2. CXR with pulmonary vascular congestion and small bibasilar pleural effusion with right greater then left. Admitted by Cardiology. Has diuresed 1L over last 24 hours. No changes were made in the patient's COPD regiment. He is compliant with his CPAP at bedtime. He is following up with PCCM for further management  of pulmonary mass noted on CT chest 08/2018.   05/17/2019  Pt. Presents for follow up of COPD and OSA. He was discharged from the hospital 05/05/2019. He states he has been compliant with his pulmonary regimen of Trelegy,  PRN Albuterol and Flonase. He has been compliant with his amiodarone and lasix. He has been compliant with his CPAP machine. He did have an episode of dyspnea and vomiting blood  that required a call to 911 on 7/13. Marland KitchenHis wife feels he coughed up about 1/4 cup of blood. The patient stabilized once EMS got there, and he did not require admission. The patient has not had any further hemoptysis. He is on Coumadin.He is no longer on Losartin.  He is using the albuterol nebs in the morning daily, Pro Air as needed ( every other day on average )he denies any fever, chest pain, no orthopnea . Again, no hemoptysis since 7/13.  Weight loss>> ++ weighed 262 now 187 lbs. This has been over the last year. He states he has not had a appetite. ( 75 pound weight loss over last 1  year)  Bloody secretions 7/13>> self resolved, no further events.He is on coumadin, I have told him this places him at risk of bleeding he cannot control, and that he needs to seek emergency care for any further hemoptysis of > 1/4 cup. He does have cough suppression medications at home per his wife.   Cough>> he has a cough, but the cough is his baseline.   Test Results: CXR 7/9 > Enlargement of cardiac silhouette post CABG and AICD with pulmonary vascular congestion. Small bibasilar pleural effusions and atelectasis greater on RIGHT. No definite acute infiltrate.  08/2018 CT Chest Irregular 1.1 x 1.3 x 1.9 cm LEFT UPPER lobe nodule worrisome for malignancy. Consider one of the following for both low-risk and high-risk individuals: (a) repeat chest CT in 3 months, (b) follow-up PET-CT, or (c) tissue sampling.   Pulmonary tests: PFT 03/30/11>>FEV1 1.98(72%), FEV1% 68, DLCO 64%  PFT 04/19/12>>FEV1 1.65 (67%), FEV1% 63, TLC 5.66 (99%), DLCO 71%, no BD. PFT 12/19/13 >> FEV1 2.03 (77%), FEV1% 72, TLC 6.45 (98%), DLCO 49% CT chest 01/18/14 >> mild centrilobular and paraseptal emphysema, mild fibrotic changes Rt periphery CT chest 01/15/16 >> emphysema  Cardiac tests Echo 01/11/16  EF 01-60%, grade 2 diastolic dysfunction. LA moderately to severely dilated PA Pressure 23 mm Hg   Sleep tests: PSG 06/14/06>>AHI 35.5  Auto CPAP 05/19/16 to 06/17/16 >>used on 28 to 30 nights with average 6 hrs 57 min. Average AHI 18.7 with  median CPAP 8 and 95 th percentile CPAP 11 cm H2O. CPAP titration 06/23/16 >> CPAP 16 cm H2O >> AHI 6.7 CPAP 02/11/17 to 05/11/17 >>used on 76 of 90 nights with average 6 hrs 4 min. Average AHI 12 with CPAP 16 cm H2O. Air leak  CBC Latest Ref Rng & Units 05/03/2019 05/02/2019 01/10/2019  WBC 4.0 - 10.5 K/uL 7.5 8.3 11.3(H)  Hemoglobin 13.0 - 17.0 g/dL 12.9(L) 13.2 11.0(L)  Hematocrit 39.0 - 52.0 % 39.6 40.9 33.3(L)  Platelets 150 - 400 K/uL 194 218 218    BMP Latest Ref  Rng & Units 05/09/2019 05/05/2019 05/04/2019  Glucose 65 - 99 mg/dL 127(H) 105(H) 113(H)  BUN 8 - 27 mg/dL 37(H) 26(H) 20  Creatinine 0.76 - 1.27 mg/dL 1.68(H) 1.29(H) 1.36(H)  BUN/Creat Ratio 10 - 24 22 - -  Sodium 134 - 144 mmol/L 139 140 142  Potassium 3.5 - 5.2 mmol/L 4.9 4.2 3.9  Chloride 96 - 106 mmol/L 98 100 101  CO2 20 - 29 mmol/L 25 28 30   Calcium 8.6 - 10.2 mg/dL 9.2 9.3 9.5    BNP    Component Value Date/Time   BNP 1,255.8 (H) 05/02/2019 1813   BNP 702.2 (H) 06/23/2016 1404    ProBNP    Component Value Date/Time   PROBNP 12,014 (H) 05/09/2019 1108   PROBNP 510.0 (H) 12/02/2008 0440    PFT    Component Value Date/Time   FEV1PRE 2.03 12/19/2013 0901   FEV1POST 1.93 12/19/2013 0901   FVCPRE 2.82 12/19/2013 0901   FVCPOST 2.86 12/19/2013 0901   TLC 6.45 12/19/2013 0901   DLCOUNC 14.25 12/19/2013 0901   PREFEV1FVCRT 72 12/19/2013 0901   PSTFEV1FVCRT 67 12/19/2013 0901    Dg Chest 2 View  Result Date: 05/16/2019 CLINICAL DATA:  Following lung nodule, COPD, atrial fibrillation, coronary artery disease post MI and CABG, type II diabetes mellitus, hypertension, ischemic dilated cardiomyopathy EXAM: CHEST - 2 VIEW COMPARISON:  05/03/2019 FINDINGS: LEFT subclavian AICD leads project at RIGHT atrium, RIGHT ventricle, and coronary sinus. Enlargement of cardiac silhouette post CABG. Atherosclerotic calcifications aorta. Emphysematous and bronchitic changes consistent with COPD. Increased atelectasis at RIGHT base with associated small RIGHT pleural effusion and slight mediastinal shift to the RIGHT. Underlying infiltrate at RIGHT base not completely excluded. Small LEFT pleural effusion and minimal LEFT basilar atelectasis. No pneumothorax. Bones demineralized with evidence of prior cervical spine surgery. IMPRESSION: Enlargement of cardiac silhouette post CABG and AICD. COPD changes with bibasilar effusions and atelectasis greater on RIGHT, slightly increased on RIGHT since  previous exam. Underlying infiltrate RIGHT base not completely excluded. Electronically Signed   By: Lavonia Dana M.D.   On: 05/16/2019 16:29   Dg Chest 2 View  Result Date: 05/03/2019 CLINICAL DATA:  Shortness of breath for 3 days, CHF, COPD, atrial fibrillation, coronary artery disease post MI and CABG, type II diabetes mellitus, hypertension EXAM: CHEST - 2 VIEW COMPARISON:  01/13/2019 FINDINGS: LEFT subclavian AICD with leads projecting at RIGHT atrium, RIGHT ventricle, and coronary sinus. Enlargement of cardiac silhouette post CABG. Atherosclerotic calcification aorta. Pulmonary vascular congestion without gross pulmonary edema. Mild central peribronchial thickening. Persistent bibasilar effusions and atelectasis greater on RIGHT. No pneumothorax. Bones demineralized. IMPRESSION: Enlargement of cardiac silhouette post CABG and AICD with pulmonary vascular congestion. Small bibasilar pleural effusions and atelectasis greater on RIGHT. No definite acute infiltrate. Electronically Signed   By: Lavonia Dana M.D.   On: 05/03/2019 07:59     Past medical hx Past Medical  History:  Diagnosis Date   Atrial fibrillation ([place])    persistent, previously seen at [facility] and placed on amiodarone   Benign prostatic hypertrophy    CAD (coronary artery disease)    multivessel s/p inferolateral wall MI with subsequent CABG 11/1998.  Cath 2009 with Patent grafts   Cleft palate    COPD with emphysema ([place]) 04/01/2010   DM (diabetes mellitus), type 2 (HCC)    Dyspnea    GERD (gastroesophageal reflux disease)    HTN (hypertension)    Hyperlipidemia    Hypothyroidism    Iron deficiency anemia    Ischemic dilated cardiomyopathy (HCC)    EF 35-40% by MUGA 6/11   Myocardial infarction ([place])    Nasal septal deviation    Nephrolithiasis    OSA (obstructive sleep apnea)    PAF (paroxysmal atrial fibrillation) (HCC)    Peripheral arterial disease (HCC)    left subclavian artery stenosis   PNA  (pneumonia)    Psoriasis    Seborrheic keratosis    Stroke (HCC)    Systolic congestive heart failure ([place]) 2009   s/p BiV ICD implantation by Dr Leonia Reeves ([facility])     Social History   Tobacco Use   Smoking status: Former Smoker    Packs/day: 3.00    Years: 65.00    Pack years: 195.00    Types: Cigarettes    Quit date: 10/25/1998    Years since quitting: 20.5   Smokeless tobacco: Never Used  Substance Use Topics   Alcohol use: No    Alcohol/week: 0.0 standard drinks    Comment: remote history of heavy alcohol use   Drug use: No    Mr.Knights reports that he quit smoking about 20 years ago. His smoking use included cigarettes. He has a 195.00 pack-year smoking history. He has never used smokeless tobacco. He reports that he does not drink alcohol or use drugs.  Tobacco Cessation: Former smoker quit 20 years ago with a 195 pack year smoking history ( Smoked 3 packs per day x 65 years)   Past surgical hx, Family hx, Social hx all reviewed.  Current Outpatient Medications on File Prior to Visit  Medication Sig   acetaminophen (TYLENOL) 500 MG tablet Take 1,000 mg by mouth at bedtime as needed for mild pain or moderate pain.   albuterol (PROVENTIL) (2.5 MG/3ML) 0.083% nebulizer solution USE 1 VIAL VIA NEBULIZER 3 TIMES A DAY AS DIRECTED. DX:J44.9   amiodarone (PACERONE) 200 MG tablet Take 1 tablet (200 mg total) by mouth daily.   atorvastatin (LIPITOR) 80 MG tablet TAKE 1 TABLET (80 MG TOTAL) BY MOUTH DAILY.   carvedilol (COREG) 3.125 MG tablet Take 1 tablet (3.125 mg total) by mouth 2 (two) times daily with a meal.   cholecalciferol (VITAMIN D) 1000 units tablet Take 2,000 Units by mouth every evening.   dextromethorphan-guaiFENesin (MUCINEX DM) 30-600 MG 12hr tablet Take 1 tablet by mouth 2 (two) times daily as needed for cough.   feeding supplement, ENSURE ENLIVE, (ENSURE ENLIVE) LIQD Take 237 mLs by mouth 2 (two) times daily between meals.   ferrous sulfate 325  (65 FE) MG tablet Take 325 mg by mouth every evening.    fluticasone (FLONASE) 50 MCG/ACT nasal spray SPRAY 2 SPRAYS INTO EACH NOSTRIL EVERY DAY   furosemide (LASIX) 40 MG tablet Take 1 tablet (40 mg total) by mouth 2 (two) times daily.   Insulin Degludec-Liraglutide (XULTOPHY) 100-3.6 UNIT-MG/ML SOPN Inject 14 Units into the skin every morning.  levothyroxine (SYNTHROID, LEVOTHROID) 100 MCG tablet Take 100 mcg by mouth daily before breakfast.    Multiple Vitamins-Minerals (PRESERVISION AREDS PO) Take 1 capsule by mouth 2 (two) times daily.    potassium chloride 20 MEQ TBCR Take 20 mEq by mouth daily.   PRESCRIPTION MEDICATION See admin instructions. CPAP- At bedtime   senna (SENOKOT) 8.6 MG tablet Take 1 tablet by mouth 2 (two) times daily. HOLD FOR LOOSE STOOLS   traMADol (ULTRAM) 50 MG tablet Take 50 mg by mouth at bedtime.    TRELEGY ELLIPTA 100-62.5-25 MCG/INH AEPB INHALE 1 PUFF INTO LUNGS ONCE A DAY   vitamin B-12 (CYANOCOBALAMIN) 1000 MCG tablet Take 1,000 mcg by mouth daily.   warfarin (COUMADIN) 5 MG tablet Take as directed by Coumadin Clinic   No current facility-administered medications on file prior to visit.      Allergies  Allergen Reactions   Adhesive [Tape] Other (See Comments)    PATIENT'S SKIN TEARS AND BRUISES VERY EASILY- IS TAKING COUMADIN!!   Aldactone [Spironolactone] Other (See Comments)    Hyperkalemia  reported by Dr. [name] - pt is currently taking 12.5 mg daily -November 2017 per medication list by same MD    Review Of Systems:  Constitutional:   +  weight loss, No night sweats,  Fevers, chills, + fatigue, or  lassitude.  HEENT:   No headaches,  Difficulty swallowing,  Tooth/dental problems, or  Sore throat,                No sneezing, itching, ear ache, nasal congestion, post nasal drip,   CV:  No chest pain,  Orthopnea, PND, + baseline swelling in lower extremities, No anasarca, dizziness, palpitations, syncope.   GI  No heartburn,  indigestion, abdominal pain, nausea, vomiting, diarrhea, change in bowel habits,+  loss of appetite, bloody stools.   Resp: No shortness of breath with exertion or at rest.  No excess mucus, + baseline  productive cough,  No non-productive cough,  +  coughing up of blood.  No change in color of mucus.  No wheezing.  No chest wall deformity  Skin: no rash or lesions.  GU: no dysuria, change in color of urine, no urgency or frequency.  No flank pain, no hematuria   MS:  No joint pain or swelling.  No decreased range of motion.  No back pain.  Psych:  No change in mood or affect. No depression or anxiety.  No memory loss.   Vital Signs BP 120/66 (BP Location: Left Arm, Cuff Size: Large)    Pulse 70    Temp 98.5 F (36.9 C) (Oral)    Ht 5\' 6"  (1.676 m)    Wt 190 lb 12.8 oz (86.5 kg)    SpO2 97%    BMI 30.80 kg/m    Physical Exam:  General- No distress,  A&Ox3, pleasant ENT: No sinus tenderness, TM clear, pale nasal mucosa, no oral exudate,no post nasal drip, no LAN Cardiac: S1, S2, regular rate and rhythm, no murmur Chest: No wheeze/ rales/ dullness; no accessory muscle use, no nasal flaring, no sternal retractions, crackles per bases bilaterally, R>L Abd.: Soft Non-tender, ND, BS +, Body mass index is 30.8 kg/m. Ext: No clubbing cyanosis, 1+ BLE edema Neuro:  Very deconditioned, MAE x 4, A&O x 3 Skin: No rashes, No lesions, warm and dry Psych: normal mood and behavior   Assessment/Plan  COPD (chronic obstructive pulmonary disease) (HCC) Currently stable with compliance with Trelegy Plan COPD Continue Trelegy  PRN Albuterol and Flonase Note your daily symptoms > remember "red flags" for COPD:  Increase in cough, increase in sputum production, increase in shortness of breath or activity intolerance. If you notice these symptoms, please call to be seen.    Follow up in 2 weekd after PET scan and PFT's  Pulmonary nodule Irregular LUL nodule worrisome for  malignancy Plan Pulmonary Nodule/ Hemoptysis -CT Chest 08/2018 withIrregular 1.1 x 1.3 x 1.9 cm LEFT UPPER lobe nodule worrisome for Malignancy Hemoptysis 7/13>> Self Resolved Plan: Will need PET and PFT's Use cough Supressant for cough Seek Emergency care for any coughing up of blood that exceeds 1/4 cup. I will notify coumadin clinic of bleeding event.  We will discuss plan, treatment options  and goals of care once we have the results of the PET scan   Hemoptysis Single event on 7/13, about 1/4 cup Plan Use cough Supressant for cough Seek Emergency care for any coughing up of blood that exceeds 1/4 cup. I will notify coumadin clinic of bleeding event.   Acute on chronic systolic heart failure (HCC) Compliant with Lasix therapy Plan Continue Lasix 40 mg twice daily per cardiology Monitor you weight  A-fib (HCC) PAF Continue Amiodarone 200 mg once daily per Cards Continue carvedilol 3.125 mg twice daily and anticoagulation with Coumadin Follow up INR's per Coumadin Clinic   Coronary artery disease CABG Continue high intensity statin therapy Follow up with cardiology   Magdalen Spatz, NP 05/17/2019  8:47 AM

## 2019-05-15 NOTE — Telephone Encounter (Signed)

## 2019-05-16 ENCOUNTER — Ambulatory Visit (INDEPENDENT_AMBULATORY_CARE_PROVIDER_SITE_OTHER): Payer: HMO | Admitting: Acute Care

## 2019-05-16 ENCOUNTER — Ambulatory Visit: Payer: HMO | Admitting: Cardiology

## 2019-05-16 ENCOUNTER — Encounter: Payer: Self-pay | Admitting: Acute Care

## 2019-05-16 ENCOUNTER — Other Ambulatory Visit: Payer: Self-pay

## 2019-05-16 ENCOUNTER — Ambulatory Visit (INDEPENDENT_AMBULATORY_CARE_PROVIDER_SITE_OTHER): Payer: HMO

## 2019-05-16 VITALS — BP 120/66 | HR 70 | Temp 98.5°F | Ht 66.0 in | Wt 190.8 lb

## 2019-05-16 DIAGNOSIS — R918 Other nonspecific abnormal finding of lung field: Secondary | ICD-10-CM

## 2019-05-16 DIAGNOSIS — I5023 Acute on chronic systolic (congestive) heart failure: Secondary | ICD-10-CM | POA: Diagnosis not present

## 2019-05-16 DIAGNOSIS — I4819 Other persistent atrial fibrillation: Secondary | ICD-10-CM | POA: Diagnosis not present

## 2019-05-16 DIAGNOSIS — J449 Chronic obstructive pulmonary disease, unspecified: Secondary | ICD-10-CM | POA: Diagnosis not present

## 2019-05-16 DIAGNOSIS — R042 Hemoptysis: Secondary | ICD-10-CM | POA: Diagnosis not present

## 2019-05-16 DIAGNOSIS — I2510 Atherosclerotic heart disease of native coronary artery without angina pectoris: Secondary | ICD-10-CM | POA: Diagnosis not present

## 2019-05-16 DIAGNOSIS — R911 Solitary pulmonary nodule: Secondary | ICD-10-CM | POA: Diagnosis not present

## 2019-05-16 DIAGNOSIS — J90 Pleural effusion, not elsewhere classified: Secondary | ICD-10-CM | POA: Diagnosis not present

## 2019-05-16 NOTE — Patient Instructions (Addendum)
It is nice to meet you today We will walk you today in the office.  We will do a CXR today  We will call you with results Continue Trelegy one puff once daily, PRN Albuterol and Flonase Rinse mouth after use Continue on CPAP at bedtime. You appear to be benefiting from the treatment  Goal is to wear for at least 6 hours each night for maximal clinical benefit. Continue to work on weight loss, as the link between excess weight  and sleep apnea is well established.   Remember to establish a good bedtime routine, and work on sleep hygiene.  Limit daytime naps , avoid stimulants such as caffeine and nicotine close to bedtime, exercise daily to promote sleep quality, avoid heavy , spicy, fried , or rich foods before bed. Ensure adequate exposure to natural light during the day,establish a relaxing bedtime routine with a pleasant sleep environment ( Bedroom between 60 and 67 degrees, turn off bright lights , TV or device screens screens , consider black out curtains or white noise machines) Do not drive if sleepy. Remember to clean mask, tubing, filter, and reservoir once weekly with soapy water.  Follow up with Dr. Halford Chessman   In 6 months  or before as needed for sleep Follow up in after PET and PFT's in the next few weeks.    Continue your amiodarone and Lasix , carvedilol 3.125 mg twice daily and anticoagulation with Coumadin and statin therapy  as you have been doing per cardiology recommendations.  Follow up INR monitoring per cardiology.  We will schedule you for a PET scan and PFT's  We will call you to schedule.  If you experience any more bleeding in your sputum, please seek emergency care immediately. Because you are on a blood thinner, you need to seek emergency care with any bleeding that does not stop with pressure, or bloody secretions that are more than 1/4 cup in volume.Marland KitchenMarland Kitchen

## 2019-05-17 ENCOUNTER — Ambulatory Visit (INDEPENDENT_AMBULATORY_CARE_PROVIDER_SITE_OTHER): Payer: HMO | Admitting: *Deleted

## 2019-05-17 ENCOUNTER — Encounter: Payer: Self-pay | Admitting: Cardiology

## 2019-05-17 ENCOUNTER — Other Ambulatory Visit: Payer: HMO | Admitting: *Deleted

## 2019-05-17 ENCOUNTER — Ambulatory Visit (INDEPENDENT_AMBULATORY_CARE_PROVIDER_SITE_OTHER): Payer: HMO | Admitting: Cardiology

## 2019-05-17 ENCOUNTER — Encounter: Payer: Self-pay | Admitting: Acute Care

## 2019-05-17 VITALS — BP 122/70 | HR 70 | Ht 67.2 in | Wt 190.0 lb

## 2019-05-17 DIAGNOSIS — I4819 Other persistent atrial fibrillation: Secondary | ICD-10-CM | POA: Diagnosis not present

## 2019-05-17 DIAGNOSIS — R042 Hemoptysis: Secondary | ICD-10-CM | POA: Insufficient documentation

## 2019-05-17 DIAGNOSIS — Z5181 Encounter for therapeutic drug level monitoring: Secondary | ICD-10-CM

## 2019-05-17 DIAGNOSIS — R911 Solitary pulmonary nodule: Secondary | ICD-10-CM | POA: Insufficient documentation

## 2019-05-17 DIAGNOSIS — Z7901 Long term (current) use of anticoagulants: Secondary | ICD-10-CM

## 2019-05-17 DIAGNOSIS — I5022 Chronic systolic (congestive) heart failure: Secondary | ICD-10-CM

## 2019-05-17 DIAGNOSIS — Z79899 Other long term (current) drug therapy: Secondary | ICD-10-CM

## 2019-05-17 DIAGNOSIS — I480 Paroxysmal atrial fibrillation: Secondary | ICD-10-CM | POA: Diagnosis not present

## 2019-05-17 LAB — POCT INR: INR: 2.3 (ref 2.0–3.0)

## 2019-05-17 NOTE — Patient Instructions (Signed)
Medication Instructions:  none If you need a refill on your cardiac medications before your next appointment, please call your pharmacy.   Lab work:TODAY BMET If you have labs (blood work) drawn today and your tests are completely normal, you will receive your results only by: [name] MyChart Message (if you have MyChart) OR . A paper copy in the mail If you have any lab test that is abnormal or we need to change your treatment, we will call you to review the results.  Testing/Procedures: NONE  Follow-Up:Referral to Advanced Heart Failure Clinic (Please schedule) At [facility], you and your health needs are our priority.  As part of our continuing mission to provide you with exceptional heart care, we have created designated Provider Care Teams.  These Care Teams include your primary Cardiologist (physician) and Advanced Practice Providers (APPs -  Physician Assistants and Nurse Practitioners) who all work together to provide you with the care you need, when you need it. .   Any Other Special Instructions Will Be Listed Below (If Applicable). Weight:  Weigh daily and if you gain more than 3 lbs in 1 DAY or 5 lbs in 1 WEEK, call our office [phone]   DASH Eating Plan DASH stands for "Dietary Approaches to Stop Hypertension." The DASH eating plan is a healthy eating plan that has been shown to reduce high blood pressure (hypertension). It may also reduce your risk for type 2 diabetes, heart disease, and stroke. The DASH eating plan may also help with weight loss. What are tips for following this plan?  General guidelines  Avoid eating more than 2,300 mg (milligrams) of salt (sodium) a day. If you have hypertension, you may need to reduce your sodium intake to 1,500 mg a day.  Limit alcohol intake to no more than 1 drink a day for nonpregnant women and 2 drinks a day for men. One drink equals 12 oz of beer, 5 oz of wine, or 1 oz of hard liquor.  Work with your health care provider to  maintain a healthy body weight or to lose weight. Ask what an ideal weight is for you.  Get at least 30 minutes of exercise that causes your heart to beat faster (aerobic exercise) most days of the week. Activities may include walking, swimming, or biking.  Work with your health care provider or diet and nutrition specialist (dietitian) to adjust your eating plan to your individual calorie needs. Reading food labels   Check food labels for the amount of sodium per serving. Choose foods with less than 5 percent of the Daily Value of sodium. Generally, foods with less than 300 mg of sodium per serving fit into this eating plan.  To find whole grains, look for the word "whole" as the first word in the ingredient list. Shopping  Buy products labeled as "low-sodium" or "no salt added."  Buy fresh foods. Avoid canned foods and premade or frozen meals. Cooking  Avoid adding salt when cooking. Use salt-free seasonings or herbs instead of table salt or sea salt. Check with your health care provider or pharmacist before using salt substitutes.  Do not fry foods. Cook foods using healthy methods such as baking, boiling, grilling, and broiling instead.  Cook with heart-healthy oils, such as olive, canola, soybean, or sunflower oil. Meal planning  Eat a balanced diet that includes: ? 5 or more servings of fruits and vegetables each day. At each meal, try to fill half of your plate with fruits and vegetables. ?  Up to 6-8 servings of whole grains each day. ? Less than 6 oz of lean meat, poultry, or fish each day. A 3-oz serving of meat is about the same size as a deck of cards. One egg equals 1 oz. ? 2 servings of low-fat dairy each day. ? A serving of nuts, seeds, or beans 5 times each week. ? Heart-healthy fats. Healthy fats called Omega-3 fatty acids are found in foods such as flaxseeds and coldwater fish, like sardines, salmon, and mackerel.  Limit how much you eat of the following: ? Canned  or prepackaged foods. ? Food that is high in trans fat, such as fried foods. ? Food that is high in saturated fat, such as fatty meat. ? Sweets, desserts, sugary drinks, and other foods with added sugar. ? Full-fat dairy products.  Do not salt foods before eating.  Try to eat at least 2 vegetarian meals each week.  Eat more home-cooked food and less restaurant, buffet, and fast food.  When eating at a restaurant, ask that your food be prepared with less salt or no salt, if possible. What foods are recommended? The items listed may not be a complete list. Talk with your dietitian about what dietary choices are best for you. Grains Whole-grain or whole-wheat bread. Whole-grain or whole-wheat pasta. Brown rice. Modena Morrow. Bulgur. Whole-grain and low-sodium cereals. Pita bread. Low-fat, low-sodium crackers. Whole-wheat flour tortillas. Vegetables Fresh or frozen vegetables (raw, steamed, roasted, or grilled). Low-sodium or reduced-sodium tomato and vegetable juice. Low-sodium or reduced-sodium tomato sauce and tomato paste. Low-sodium or reduced-sodium canned vegetables. Fruits All fresh, dried, or frozen fruit. Canned fruit in natural juice (without added sugar). Meat and other protein foods Skinless chicken or [place]. Ground chicken or [place]. Pork with fat trimmed off. Fish and seafood. Egg whites. Dried beans, peas, or lentils. Unsalted nuts, nut butters, and seeds. Unsalted canned beans. Lean cuts of beef with fat trimmed off. Low-sodium, lean deli meat. Dairy Low-fat (1%) or fat-free (skim) milk. Fat-free, low-fat, or reduced-fat cheeses. Nonfat, low-sodium ricotta or cottage cheese. Low-fat or nonfat yogurt. Low-fat, low-sodium cheese. Fats and oils Soft margarine without trans fats. Vegetable oil. Low-fat, reduced-fat, or light mayonnaise and salad dressings (reduced-sodium). Canola, safflower, olive, soybean, and sunflower oils. Avocado. Seasoning and other foods Herbs. Spices.  Seasoning mixes without salt. Unsalted popcorn and pretzels. Fat-free sweets. What foods are not recommended? The items listed may not be a complete list. Talk with your dietitian about what dietary choices are best for you. Grains Baked goods made with fat, such as croissants, muffins, or some breads. Dry pasta or rice meal packs. Vegetables Creamed or fried vegetables. Vegetables in a cheese sauce. Regular canned vegetables (not low-sodium or reduced-sodium). Regular canned tomato sauce and paste (not low-sodium or reduced-sodium). Regular tomato and vegetable juice (not low-sodium or reduced-sodium). Angie Fava. Olives. Fruits Canned fruit in a light or heavy syrup. Fried fruit. Fruit in cream or butter sauce. Meat and other protein foods Fatty cuts of meat. Ribs. Fried meat. Berniece Salines. Sausage. Bologna and other processed lunch meats. Salami. Fatback. Hotdogs. Bratwurst. Salted nuts and seeds. Canned beans with added salt. Canned or smoked fish. Whole eggs or egg yolks. Chicken or [place] with skin. Dairy Whole or 2% milk, cream, and half-and-half. Whole or full-fat cream cheese. Whole-fat or sweetened yogurt. Full-fat cheese. Nondairy creamers. Whipped toppings. Processed cheese and cheese spreads. Fats and oils Butter. Stick margarine. Lard. Shortening. Ghee. Bacon fat. Tropical oils, such as coconut, palm kernel, or palm oil.  Seasoning and other foods Salted popcorn and pretzels. Onion salt, garlic salt, seasoned salt, table salt, and sea salt. Worcestershire sauce. Tartar sauce. Barbecue sauce. Teriyaki sauce. Soy sauce, including reduced-sodium. Steak sauce. Canned and packaged gravies. Fish sauce. Oyster sauce. Cocktail sauce. Horseradish that you find on the shelf. Ketchup. Mustard. Meat flavorings and tenderizers. Bouillon cubes. Hot sauce and Tabasco sauce. Premade or packaged marinades. Premade or packaged taco seasonings. Relishes. Regular salad dressings. Where to find more information:   National Heart, Lung, and [place]: https://wilson-eaton.com/  American Heart Association: www.heart.org Summary  The DASH eating plan is a healthy eating plan that has been shown to reduce high blood pressure (hypertension). It may also reduce your risk for type 2 diabetes, heart disease, and stroke.  With the DASH eating plan, you should limit salt (sodium) intake to 2,300 mg a day. If you have hypertension, you may need to reduce your sodium intake to 1,500 mg a day.  When on the DASH eating plan, aim to eat more fresh fruits and vegetables, whole grains, lean proteins, low-fat dairy, and heart-healthy fats.  Work with your health care provider or diet and nutrition specialist (dietitian) to adjust your eating plan to your individual calorie needs. This information is not intended to replace advice given to you by your health care provider. Make sure you discuss any questions you have with your health care provider. Document Released: 09/30/2011 Document Revised: 09/23/2017 Document Reviewed: 10/04/2016 Elsevier Patient Education  2020 [employer].

## 2019-05-17 NOTE — Patient Instructions (Signed)
Description   Continue dosage 1 tablet everyday.  Recheck in 1 week. Amiodarone increased to 200mg  daily 05/05/2019.

## 2019-05-17 NOTE — Assessment & Plan Note (Signed)
CABG Continue high intensity statin therapy Follow up with cardiology

## 2019-05-17 NOTE — Assessment & Plan Note (Signed)
PAF Continue Amiodarone 200 mg once daily per Cards Continue carvedilol 3.125 mg twice daily and anticoagulation with Coumadin Follow up INR's per Coumadin Clinic

## 2019-05-17 NOTE — Assessment & Plan Note (Addendum)
Irregular LUL nodule worrisome for malignancy Plan Pulmonary Nodule/ Hemoptysis -CT Chest 08/2018 withIrregular 1.1 x 1.3 x 1.9 cm LEFT UPPER lobe nodule worrisome for Malignancy Hemoptysis 7/13>> Self Resolved Plan: Will need PET and PFT's Use cough Supressant for cough Seek Emergency care for any coughing up of blood that exceeds 1/4 cup. I will notify coumadin clinic of bleeding event.  We will discuss plan, treatment options  and goals of care once we have the results of the PET scan

## 2019-05-17 NOTE — Assessment & Plan Note (Signed)
Single event on 7/13, about 1/4 cup Plan Use cough Supressant for cough Seek Emergency care for any coughing up of blood that exceeds 1/4 cup. I will notify coumadin clinic of bleeding event.

## 2019-05-17 NOTE — Progress Notes (Signed)
Remote ICD transmission.   

## 2019-05-17 NOTE — Progress Notes (Signed)
05/17/2019 [name]   01/22/1934  [id number]  Primary Physician [name], MD Primary Cardiologist: Larae Grooms, MD  Electrophysiologist: Thompson Grayer, MD   Reason for Visit/CC: f/u for chronic systolic HF  HPI: [name] is a 85 y.o. male with a hx ofCAD s/p CABG in 2000 with patent grafts on cardiac catheterization in 2009 and negative Myoview in 5631, chronic systolic CHF/ischemic cardiomyopathy with EF of 30-35%, persistent atrial fibrillation s/p PVI ablation in 2017 on Amiodarone and Coumadin, hypertension, hyperlipidemia, diabetes mellitus, CVA, COPD, hypothyroidism, PAD with left subclavian stenosis, obstructive sleep apnea on CPAP and CKD seen for 1 week follow up.   He was recently admitted to the hospital, from clinic, for acute CHF exacerbation and recurrent atrial fibrillation. He was directly admitted by Dr. Caryl Comes. His amiodarone was increased to 400 mg BID.  He was treated w/  IV diuretics with excellent result. Discharge weight was 185 lb. Losartan was added to his regimen. His BP was felt to be too soft to tolerate Entresto. Hospital notes mentioned that he had converted back to NSR on increased dose of amiodarone and dose was tapered down to 200 mg daily.   He had post hospital f/u on 7/15. Weight had increased to 194 lb. However, weight had been stable 191-192lb on home scale. Trace edema was noted based on office documentation. He also endorsed hemoptysis. This was in the setting of supratherapeutic INR at 4.1. Coumadin clinic adjusted his dose. He was advised to continue Lasix 40 mg BID. BMP was orderd and showed increase in SCr after starting Losartan. Creatinine had increased from 1.2>>1.6. BNP was 12,000. Results were reviewed by [name]. He advised stopping Losartan and recommended referral to Advanced HF Clinic (appt not yet made). He was advised to keep appt that had already been scheduled w/ me for today. Also of note, pt had f/u with  pulmonology given his recent hemoptysis. He denies any further hemoptysis but recent imaging showed irregular LUL nodule worrisome for malignancy. He is scheduled for PET and PFTs.   He is here with his wife. He had a coumadin appt earlier today. INR therapeutic at 2.3. His weight today is 190 lb. BP 122/70. HR 70. He feels "ok". No resting dyspnea but has chronic exertional dyspnea that is stable.  No PND no LEE. Denies CP. Notes decreased appetite. Compliant w/ meds and daily weights. Weight at home this past week has been ~188-189 lb.    Cardiac Studies  2D Echo 09/19/18  Study Conclusions  - Left ventricle: The cavity size was severely dilated. Systolic   function was moderately to severely reduced. The estimated   ejection fraction was in the range of 30% to 35%. Severe diffuse   hypokinesis with distinct regional wall motion abnormalities.   There is severe hypokinesis to akinesis of the inferolateral and   inferior myocardium. There is hypokinesis of the lateral   myocardium. There is akinesis of the apical septal, apical   anterior and apical myocardium. - Aortic valve: Mildly to moderately calcified annulus. Trileaflet;   normal thickness, moderately calcified leaflets. Valve area   (VTI): 2.55 cm^2. Valve area (Vmax): 2.7 cm^2. Valve area   (Vmean): 2.13 cm^2. - Aorta: Aortic root dimension: 39 mm (ED). - Aortic root: The aortic root was mildly dilated. - Mitral valve: There was mild regurgitation. Valve area by   continuity equation (using LVOT flow): 2.29 cm^2. - Left atrium: The atrium was severely dilated. - Right atrium: The  atrium was mildly dilated. - Pulmonic valve: There was trivial regurgitation. - Pulmonary arteries: PA peak pressure: 45 mm Hg (S).  Impressions:  - The right ventricular systolic pressure was increased consistent   with moderate pulmonary hypertension.  Current Meds  Medication Sig   acetaminophen (TYLENOL) 500 MG tablet Take 1,000 mg by  mouth at bedtime as needed for mild pain or moderate pain.   albuterol (PROVENTIL) (2.5 MG/3ML) 0.083% nebulizer solution USE 1 VIAL VIA NEBULIZER 3 TIMES A DAY AS DIRECTED. DX:J44.9   amiodarone (PACERONE) 200 MG tablet Take 1 tablet (200 mg total) by mouth daily.   atorvastatin (LIPITOR) 80 MG tablet TAKE 1 TABLET (80 MG TOTAL) BY MOUTH DAILY.   carvedilol (COREG) 3.125 MG tablet Take 1 tablet (3.125 mg total) by mouth 2 (two) times daily with a meal.   cholecalciferol (VITAMIN D) 1000 units tablet Take 2,000 Units by mouth every evening.   dextromethorphan-guaiFENesin (MUCINEX DM) 30-600 MG 12hr tablet Take 1 tablet by mouth 2 (two) times daily as needed for cough.   feeding supplement, ENSURE ENLIVE, (ENSURE ENLIVE) LIQD Take 237 mLs by mouth 2 (two) times daily between meals.   ferrous sulfate 325 (65 FE) MG tablet Take 325 mg by mouth every evening.    fluticasone (FLONASE) 50 MCG/ACT nasal spray SPRAY 2 SPRAYS INTO EACH NOSTRIL EVERY DAY   furosemide (LASIX) 40 MG tablet Take 1 tablet (40 mg total) by mouth 2 (two) times daily.   Insulin Degludec-Liraglutide (XULTOPHY) 100-3.6 UNIT-MG/ML SOPN Inject 14 Units into the skin every morning.    levothyroxine (SYNTHROID, LEVOTHROID) 100 MCG tablet Take 100 mcg by mouth daily before breakfast.    Multiple Vitamins-Minerals (PRESERVISION AREDS PO) Take 1 capsule by mouth 2 (two) times daily.    potassium chloride 20 MEQ TBCR Take 20 mEq by mouth daily.   PRESCRIPTION MEDICATION See admin instructions. CPAP- At bedtime   senna (SENOKOT) 8.6 MG tablet Take 1 tablet by mouth 2 (two) times daily. HOLD FOR LOOSE STOOLS   traMADol (ULTRAM) 50 MG tablet Take 50 mg by mouth at bedtime.    TRELEGY ELLIPTA 100-62.5-25 MCG/INH AEPB INHALE 1 PUFF INTO LUNGS ONCE A DAY   vitamin B-12 (CYANOCOBALAMIN) 1000 MCG tablet Take 1,000 mcg by mouth daily.   warfarin (COUMADIN) 5 MG tablet Take as directed by Coumadin Clinic   Allergies  Allergen  Reactions   Adhesive [Tape] Other (See Comments)    PATIENT'S SKIN TEARS AND BRUISES VERY EASILY- IS TAKING COUMADIN!!   Aldactone [Spironolactone] Other (See Comments)    Hyperkalemia  reported by Dr. [name] - pt is currently taking 12.5 mg daily -November 2017 per medication list by same MD   Past Medical History:  Diagnosis Date   Atrial fibrillation ([place])    persistent, previously seen at [facility] and placed on amiodarone   Benign prostatic hypertrophy    CAD (coronary artery disease)    multivessel s/p inferolateral wall MI with subsequent CABG 11/1998.  Cath 2009 with Patent grafts   Cleft palate    COPD with emphysema ([place]) 04/01/2010   DM (diabetes mellitus), type 2 (HCC)    Dyspnea    GERD (gastroesophageal reflux disease)    HTN (hypertension)    Hyperlipidemia    Hypothyroidism    Iron deficiency anemia    Ischemic dilated cardiomyopathy (HCC)    EF 35-40% by MUGA 6/11   Myocardial infarction ([place])    Nasal septal deviation    Nephrolithiasis  OSA (obstructive sleep apnea)    PAF (paroxysmal atrial fibrillation) (HCC)    Peripheral arterial disease (HCC)    left subclavian artery stenosis   PNA (pneumonia)    Psoriasis    Seborrheic keratosis    Stroke (HCC)    Systolic congestive heart failure ([place]) 2009   s/p BiV ICD implantation by Dr Leonia Reeves (MDT)   Family History  Problem Relation Age of Onset   Asthma Father    Stroke Father    Hypertension Father    Hypertension Mother    Heart disease Brother    Heart attack Brother    Hypertension Sister    Hypertension Brother    Past Surgical History:  Procedure Laterality Date   BI-VENTRICULAR IMPLANTABLE CARDIOVERTER DEFIBRILLATOR  (CRT-D)  10-08-08; 11-06-2013   Dr Leonia Reeves (MDT) implant for primary prevention; gen change to MDT VivaXT CRTD by Dr Rayann Heman   BIV ICD [facility] CHANGE OUT N/A 11/06/2013   Procedure: BIV ICD GENERTAOR CHANGE OUT;  Surgeon: Coralyn Mark, MD;   Location: [facility] CATH LAB;  Service: Cardiovascular;  Laterality: N/A;   c-spine surgery     CARDIOVERSION N/A 04/29/2016   Procedure: CARDIOVERSION;  Surgeon: Pixie Casino, MD;  Location: [facility] ENDOSCOPY;  Service: Cardiovascular;  Laterality: N/A;   CARPAL TUNNEL RELEASE     CATARACT EXTRACTION     CORONARY ARTERY BYPASS GRAFT     LIMA to LAD, SVG to OM, SVG to diagonal   ELECTROPHYSIOLOGIC STUDY N/A 06/11/2016   Procedure: Atrial Fibrillation Ablation;  Surgeon: Thompson Grayer, MD;  Location: [facility] CV LAB;  Service: Cardiovascular;  Laterality: N/A;   left cleft palate and left cleft lip repair     Social History   Socioeconomic History   Marital status: Married    Spouse name: Not on file   Number of children: Not on file   Years of education: Not on file   Highest education level: Not on file  Occupational History   Occupation: retired    Comment: Optician, dispensing strain: Not on file   Food insecurity    Worry: Not on file    Inability: Not on file   Transportation needs    Medical: Not on file    Non-medical: Not on file  Tobacco Use   Smoking status: Former Smoker    Packs/day: 3.00    Years: 65.00    Pack years: 195.00    Types: Cigarettes    Quit date: 10/25/1998    Years since quitting: 20.5   Smokeless tobacco: Never Used  Substance and Sexual Activity   Alcohol use: No    Alcohol/week: 0.0 standard drinks    Comment: remote history of heavy alcohol use   Drug use: No   Sexual activity: Not on file  Lifestyle   Physical activity    Days per week: Not on file    Minutes per session: Not on file   Stress: Not on file  Relationships   Social connections    Talks on phone: Not on file    Gets together: Not on file    Attends religious service: Not on file    Active member of club or organization: Not on file    Attends meetings of clubs or organizations: Not on file    Relationship status: Not on file     Intimate partner violence    Fear of current or ex partner: Not on file  Emotionally abused: Not on file    Physically abused: Not on file    Forced sexual activity: Not on file  Other Topics Concern   Not on file  Social History Narrative   Lives [place]   Retired     Lipid Panel     Component Value Date/Time   CHOL 137 11/12/2014 0921   TRIG 95 11/12/2014 0921   HDL 50 11/12/2014 0921   CHOLHDL 2.6 11/29/2008 0330   VLDL 10 11/29/2008 0330   LDLCALC 68 11/12/2014 0921    Review of Systems: General: negative for chills, fever, night sweats or weight changes.  Cardiovascular: negative for chest pain, dyspnea on exertion, edema, orthopnea, palpitations, paroxysmal nocturnal dyspnea or shortness of breath Dermatological: negative for rash Respiratory: negative for cough or wheezing Urologic: negative for hematuria Abdominal: negative for nausea, vomiting, diarrhea, bright red blood per rectum, melena, or hematemesis Neurologic: negative for visual changes, syncope, or dizziness All other systems reviewed and are otherwise negative except as noted above.   Physical Exam:  Blood pressure 122/70, pulse 70, height 5' 7.2" (1.707 m), weight 190 lb (86.2 kg).  General appearance: alert, cooperative, no distress and elerly WM Neck: no carotid bruit and no JVD Lungs: decreased BS RLL, otherwise clear Heart: regular rate and rhythm, S1, S2 normal, no murmur, click, rub or gallop Extremities: trace bilateral ankle edema Pulses: 2+ and symmetric Skin: Skin color, texture, turgor normal. No rashes or lesions Neurologic: Grossly normal  EKG not performed -- personally reviewed   ASSESSMENT AND PLAN:   1. Chronic Systolic CHF, NYHA Functional Class III: 2/2 ICM. EF 30-35%. Has BiV ICD but defibrillator function turned off 1 yr ago. Medical therapy limited. Did not tolerate ARB due to significant increase in SCr. Will avoid Entresto for this reason. He did not tolerate  spironolactone due to hyperkalemia. He is tolerating low dose  blocker, Coreg. He is on Lasix for volume control. Euvolemic on exam. NYHA Functional Class III symptoms. [name] has placed referral to the AHF Clinic.    2. Atrial Fibrillation: followed by Afib Clinic. He has required recent titration of amiodarone due recurrent afib. He was on 400 mg BID for several days, but dose titrated down, now at 200 mg once daily. RRR on exam. He is on coumadin for a/c. INR therapeutic today at 2.3.   3. CAD: s/p CABG in 2000. Patent grafts on cardiac catheterization in 2009. Stable w/o angina.   4. OSA: on CPAP. Reports full compliance.   5. BiV ICD: Device was last checked on 04/30/2019 - reported persistent atrial fibrillation since end of May. Dr. Caryl Comes increased amiodarone as noted above. His defibrillator was turned off about a year ago, due to RV lead failure.   6. Chronic Anticoagulation: coumadin for afib. INR therapeutic today at 2.3. Coumadin clinic managing dosing.   7. Hemoptysis: recent occurrence, in the setting of supra therapeutic INR, 4.1. INR improved at 2.3 today. No further hemoptysis. He is undergoing w/u for possible malignant pulmonary nodule.   8. Pulmonary Nodule: CT Chest 08/2018 withIrregular 1.1 x 1.3 x 1.9 cm LEFT UPPER lobe nodule worrisome for Malignancy. Hemoptysis 7/13>> Self Resolved. Pulmonology following. He is scheduled for PET and PFTs.   9. A/c Kidney Disease: SCr increased from 1.2>>1.6 after Losartan initiation. This was discontinued on 7/15. He will get repeat BMP today to reassess renal function.    Follow-Up: Keep general cardiology f/u in Sep with [name].  [name] has also  recommended [facility] Referral  Issiac Jamar Ladoris Gene, MHS [facility] 05/17/2019 4:27 PM

## 2019-05-17 NOTE — Assessment & Plan Note (Signed)
Currently stable with compliance with Trelegy Plan COPD Continue Trelegy  PRN Albuterol and Flonase Note your daily symptoms > remember "red flags" for COPD:  Increase in cough, increase in sputum production, increase in shortness of breath or activity intolerance. If you notice these symptoms, please call to be seen.    Follow up in 2 weekd after PET scan and PFT's

## 2019-05-17 NOTE — Assessment & Plan Note (Signed)
Compliant with Lasix therapy Plan Continue Lasix 40 mg twice daily per cardiology Monitor you weight

## 2019-05-18 LAB — BASIC METABOLIC PANEL
BUN/Creatinine Ratio: 13 (ref 10–24)
BUN: 15 mg/dL (ref 8–27)
CO2: 25 mmol/L (ref 20–29)
Calcium: 8.9 mg/dL (ref 8.6–10.2)
Chloride: 101 mmol/L (ref 96–106)
Creatinine, Ser: 1.19 mg/dL (ref 0.76–1.27)
GFR calc Af Amer: 65 mL/min/{1.73_m2} (ref >59)
GFR calc non Af Amer: 56 mL/min/{1.73_m2} — ABNORMAL LOW (ref >59)
Glucose: 169 mg/dL — ABNORMAL HIGH (ref 65–99)
Potassium: 4.8 mmol/L (ref 3.5–5.2)
Sodium: 141 mmol/L (ref 134–144)

## 2019-05-21 ENCOUNTER — Ambulatory Visit (INDEPENDENT_AMBULATORY_CARE_PROVIDER_SITE_OTHER): Payer: HMO

## 2019-05-21 DIAGNOSIS — I5022 Chronic systolic (congestive) heart failure: Secondary | ICD-10-CM

## 2019-05-21 DIAGNOSIS — Z95810 Presence of automatic (implantable) cardiac defibrillator: Secondary | ICD-10-CM

## 2019-05-22 ENCOUNTER — Telehealth: Payer: Self-pay

## 2019-05-22 ENCOUNTER — Telehealth: Payer: Self-pay | Admitting: Pharmacist

## 2019-05-22 NOTE — Telephone Encounter (Signed)
Remote ICM transmission received.  Attempted call to wife regarding ICM remote transmission and left detailed message, per DPR, with next ICM remote transmission date of 06/26/2019.  Advised to return call for any fluid symptoms or questions.

## 2019-05-22 NOTE — Telephone Encounter (Signed)

## 2019-05-22 NOTE — Progress Notes (Signed)
EPIC Encounter for ICM Monitoring  Patient Name: MARQUAY KRUSE is a 85 y.o. male Date: 05/22/2019 Primary Care Physican: [name], MD Primary [place] Electrophysiologist: Allred 04/18/2019 Weight:187-191 lbs 04/30/2019 Weight: 191 lbs  Estimated remaining battery longevity30months.  Since 10-May-2019 Time in AT/AF 0.0 hr/day (0.0%)     Attempted call to wife and unable to reach.  Left detailed message per DPR regarding transmission. Transmission reviewed.    OptivolThoracic impedance normal.  Prescribed:Furosemide40 mg take1 tablet twice a day.  Labs: 05/17/2019 Creatinine 1.19, BUN 15, Potassium 4.8, Sodium 141, GFR 56-65 05/09/2019 Creatinine 1.68, BUN 37, Potassium 4.9, Sodium 139, GFR 37-43 05/05/2019 Creatinine 1.29, BUN 26, Potassium 4.2, Sodium 140, GFR 51-59 05/04/2019 Creatinine 1.36, BUN 20, Potassium 3.9, Sodium 142, GFR 48-55  05/03/2019 Creatinine 1.26, BUN 17, Potassium 4.5, Sodium 139, GFR 52->60  05/02/2019 Creatinine 1.25, BUN 17, Potassium 4.5, Sodium 139, GFR 53->60  01/13/2019 Creatinine1.11, BUN22, Potassium4.2, Sodium138, GFR>60 01/12/2019 Creatinine1.35, BUN22, Potassium3.6, Sodium138, [id number]  Acomplete set of results can be found in Results Review.  Recommendations:  Left voice mail with ICM number and encouraged to call if experiencing any fluid symptoms.  Follow-up plan: ICM clinic phone appointment on9/10/2018.  Has been referred to CHF clinic. OV with Dr Irish Lack 06/27/2019  Copy of ICM check sent to Dr.Allred.  3 month ICM trend: 05/21/2019    1 Year ICM trend:       Rosalene Billings, RN 05/22/2019 12:24 PM

## 2019-05-23 ENCOUNTER — Telehealth: Payer: Self-pay

## 2019-05-23 NOTE — Telephone Encounter (Signed)
Notes recorded by Frederik Schmidt, RN on 05/23/2019 at 8:13 AM EDT  The patient's wife has been notified of the result and verbalized understanding. All questions (if any) were answered.  Frederik Schmidt, RN 05/23/2019 8:13 AM

## 2019-05-23 NOTE — Telephone Encounter (Signed)
-----   Message from Consuelo Pandy, [place] sent at 05/22/2019  5:44 PM EDT ----- Kidney function has improved. Continue current meds as ordered. No additional changes at this time.

## 2019-05-24 ENCOUNTER — Other Ambulatory Visit: Payer: Self-pay

## 2019-05-24 ENCOUNTER — Ambulatory Visit (INDEPENDENT_AMBULATORY_CARE_PROVIDER_SITE_OTHER): Payer: HMO | Admitting: *Deleted

## 2019-05-24 DIAGNOSIS — I480 Paroxysmal atrial fibrillation: Secondary | ICD-10-CM | POA: Diagnosis not present

## 2019-05-24 DIAGNOSIS — I4819 Other persistent atrial fibrillation: Secondary | ICD-10-CM | POA: Diagnosis not present

## 2019-05-24 DIAGNOSIS — Z7901 Long term (current) use of anticoagulants: Secondary | ICD-10-CM | POA: Diagnosis not present

## 2019-05-24 DIAGNOSIS — Z5181 Encounter for therapeutic drug level monitoring: Secondary | ICD-10-CM

## 2019-05-24 LAB — POCT INR: INR: 2.8 (ref 2.0–3.0)

## 2019-05-24 NOTE — Patient Instructions (Addendum)
Description   Continue dosage 1 tablet everyday.  Recheck in 10 days. Amiodarone increased to 200mg  daily 05/05/2019.

## 2019-05-27 ENCOUNTER — Other Ambulatory Visit: Payer: Self-pay | Admitting: Physician Assistant

## 2019-05-30 ENCOUNTER — Other Ambulatory Visit: Payer: Self-pay

## 2019-05-30 ENCOUNTER — Ambulatory Visit (HOSPITAL_COMMUNITY)
Admission: RE | Admit: 2019-05-30 | Discharge: 2019-05-30 | Disposition: A | Discharge status: Home / Self Care | Payer: HMO | Source: Ambulatory Visit | Attending: Acute Care | Admitting: Acute Care

## 2019-05-30 DIAGNOSIS — J439 Emphysema, unspecified: Secondary | ICD-10-CM | POA: Insufficient documentation

## 2019-05-30 DIAGNOSIS — N200 Calculus of kidney: Secondary | ICD-10-CM | POA: Insufficient documentation

## 2019-05-30 DIAGNOSIS — I700 Atherosclerosis of aorta: Secondary | ICD-10-CM | POA: Diagnosis not present

## 2019-05-30 DIAGNOSIS — R918 Other nonspecific abnormal finding of lung field: Secondary | ICD-10-CM | POA: Diagnosis not present

## 2019-05-30 DIAGNOSIS — I2510 Atherosclerotic heart disease of native coronary artery without angina pectoris: Secondary | ICD-10-CM | POA: Diagnosis not present

## 2019-05-30 DIAGNOSIS — R911 Solitary pulmonary nodule: Secondary | ICD-10-CM | POA: Diagnosis not present

## 2019-05-30 DIAGNOSIS — J90 Pleural effusion, not elsewhere classified: Secondary | ICD-10-CM | POA: Diagnosis not present

## 2019-05-30 DIAGNOSIS — N400 Enlarged prostate without lower urinary tract symptoms: Secondary | ICD-10-CM | POA: Diagnosis not present

## 2019-05-30 DIAGNOSIS — K8020 Calculus of gallbladder without cholecystitis without obstruction: Secondary | ICD-10-CM | POA: Diagnosis not present

## 2019-05-30 LAB — GLUCOSE, CAPILLARY: Glucose-Capillary: 97 mg/dL (ref 70–99)

## 2019-05-30 MED ORDER — FLUDEOXYGLUCOSE F - 18 (FDG) INJECTION: 9.5000 | Freq: Once | INTRAVENOUS | Status: DC

## 2019-05-31 ENCOUNTER — Other Ambulatory Visit: Payer: Self-pay | Admitting: *Deleted

## 2019-05-31 MED ORDER — DOXYCYCLINE HYCLATE 100 MG PO TABS: 100.0000 mg | ORAL_TABLET | Freq: Two times a day (BID) | ORAL | 0 refills | Status: DC

## 2019-05-31 NOTE — Progress Notes (Signed)
I have called the patient with the results of his PETscan . I explained that the area od concern in November has resolved, but there is a question od some infection. Per the patient he has Been feeling a little under the weather. No fever, but cough.   Triage, please send in prescription for Doxycycline 100 mg BID x 7 days  Take with full glass of water and use sun block if in the sun.   Jonelle Sidle, please schedule a video/ telephone visit for follow up in 2 weeks with me. Thanks so much   rx sent televisit scheduled 8/19 Spouse aware rx sent to [facility].

## 2019-06-04 ENCOUNTER — Ambulatory Visit (INDEPENDENT_AMBULATORY_CARE_PROVIDER_SITE_OTHER): Payer: HMO

## 2019-06-04 ENCOUNTER — Other Ambulatory Visit: Payer: Self-pay

## 2019-06-04 DIAGNOSIS — I480 Paroxysmal atrial fibrillation: Secondary | ICD-10-CM | POA: Diagnosis not present

## 2019-06-04 DIAGNOSIS — I4819 Other persistent atrial fibrillation: Secondary | ICD-10-CM

## 2019-06-04 DIAGNOSIS — Z5181 Encounter for therapeutic drug level monitoring: Secondary | ICD-10-CM

## 2019-06-04 DIAGNOSIS — Z7901 Long term (current) use of anticoagulants: Secondary | ICD-10-CM | POA: Diagnosis not present

## 2019-06-04 LAB — POCT INR: INR: 3.2 — AB (ref 2.0–3.0)

## 2019-06-04 NOTE — Patient Instructions (Signed)
Please skip coumadin tonight, then continue dosage 1 tablet everyday.   Recheck in 2 weeks.  Amiodarone increased to 200mg  daily 05/05/2019.

## 2019-06-07 ENCOUNTER — Emergency Department (HOSPITAL_COMMUNITY)
Admission: EM | Admit: 2019-06-07 | Discharge: 2019-06-08 | Disposition: A | Discharge status: Home / Self Care | Payer: HMO | Attending: Emergency Medicine | Admitting: Emergency Medicine

## 2019-06-07 ENCOUNTER — Encounter (HOSPITAL_COMMUNITY): Payer: Self-pay | Admitting: Emergency Medicine

## 2019-06-07 ENCOUNTER — Emergency Department (HOSPITAL_COMMUNITY): Payer: HMO

## 2019-06-07 ENCOUNTER — Other Ambulatory Visit: Payer: Self-pay

## 2019-06-07 DIAGNOSIS — R531 Weakness: Secondary | ICD-10-CM | POA: Insufficient documentation

## 2019-06-07 DIAGNOSIS — R0602 Shortness of breath: Secondary | ICD-10-CM | POA: Diagnosis not present

## 2019-06-07 DIAGNOSIS — I2510 Atherosclerotic heart disease of native coronary artery without angina pectoris: Secondary | ICD-10-CM | POA: Diagnosis not present

## 2019-06-07 DIAGNOSIS — Z87891 Personal history of nicotine dependence: Secondary | ICD-10-CM | POA: Insufficient documentation

## 2019-06-07 DIAGNOSIS — J449 Chronic obstructive pulmonary disease, unspecified: Secondary | ICD-10-CM | POA: Diagnosis not present

## 2019-06-07 DIAGNOSIS — I4891 Unspecified atrial fibrillation: Secondary | ICD-10-CM | POA: Insufficient documentation

## 2019-06-07 DIAGNOSIS — I252 Old myocardial infarction: Secondary | ICD-10-CM | POA: Insufficient documentation

## 2019-06-07 DIAGNOSIS — I5022 Chronic systolic (congestive) heart failure: Secondary | ICD-10-CM | POA: Diagnosis not present

## 2019-06-07 DIAGNOSIS — Z7901 Long term (current) use of anticoagulants: Secondary | ICD-10-CM | POA: Insufficient documentation

## 2019-06-07 DIAGNOSIS — R600 Localized edema: Secondary | ICD-10-CM | POA: Diagnosis not present

## 2019-06-07 DIAGNOSIS — E119 Type 2 diabetes mellitus without complications: Secondary | ICD-10-CM | POA: Diagnosis not present

## 2019-06-07 DIAGNOSIS — I130 Hypertensive heart and chronic kidney disease with heart failure and stage 1 through stage 4 chronic kidney disease, or unspecified chronic kidney disease: Secondary | ICD-10-CM | POA: Diagnosis not present

## 2019-06-07 DIAGNOSIS — E039 Hypothyroidism, unspecified: Secondary | ICD-10-CM | POA: Insufficient documentation

## 2019-06-07 DIAGNOSIS — Z794 Long term (current) use of insulin: Secondary | ICD-10-CM | POA: Diagnosis not present

## 2019-06-07 DIAGNOSIS — N183 Chronic kidney disease, stage 3 (moderate): Secondary | ICD-10-CM | POA: Diagnosis not present

## 2019-06-07 DIAGNOSIS — Z20828 Contact with and (suspected) exposure to other viral communicable diseases: Secondary | ICD-10-CM | POA: Diagnosis not present

## 2019-06-07 DIAGNOSIS — Z79899 Other long term (current) drug therapy: Secondary | ICD-10-CM | POA: Diagnosis not present

## 2019-06-07 LAB — CBC
HCT: 41.1 % (ref 39.0–52.0)
Hemoglobin: 13.1 g/dL (ref 13.0–17.0)
MCH: 30.2 pg (ref 26.0–34.0)
MCHC: 31.9 g/dL (ref 30.0–36.0)
MCV: 94.7 fL (ref 80.0–100.0)
Platelets: 194 10*3/uL (ref 150–400)
RBC: 4.34 MIL/uL (ref 4.22–5.81)
RDW: 14.6 % (ref 11.5–15.5)
WBC: 7.9 10*3/uL (ref 4.0–10.5)
nRBC: 0 % (ref 0.0–0.2)

## 2019-06-07 LAB — CBG MONITORING, ED: Glucose-Capillary: 102 mg/dL — ABNORMAL HIGH (ref 70–99)

## 2019-06-07 LAB — URINALYSIS, ROUTINE W REFLEX MICROSCOPIC
Bilirubin Urine: NEGATIVE
Glucose, UA: NEGATIVE mg/dL
Hgb urine dipstick: NEGATIVE
Ketones, ur: NEGATIVE mg/dL
Leukocytes,Ua: NEGATIVE
Nitrite: NEGATIVE
Protein, ur: NEGATIVE mg/dL
Specific Gravity, Urine: 1.008 (ref 1.005–1.030)
pH: 7 (ref 5.0–8.0)

## 2019-06-07 LAB — BASIC METABOLIC PANEL
Anion gap: 10 (ref 5–15)
BUN: 23 mg/dL (ref 8–23)
CO2: 24 mmol/L (ref 22–32)
Calcium: 9.1 mg/dL (ref 8.9–10.3)
Chloride: 102 mmol/L (ref 98–111)
Creatinine, Ser: 1.35 mg/dL — ABNORMAL HIGH (ref 0.61–1.24)
GFR calc Af Amer: 56 mL/min — ABNORMAL LOW (ref >60)
GFR calc non Af Amer: 48 mL/min — ABNORMAL LOW (ref >60)
Glucose, Bld: 134 mg/dL — ABNORMAL HIGH (ref 70–99)
Potassium: 4.5 mmol/L (ref 3.5–5.1)
Sodium: 136 mmol/L (ref 135–145)

## 2019-06-07 LAB — SARS CORONAVIRUS 2 BY RT PCR (HOSPITAL ORDER, PERFORMED IN ~~LOC~~ HOSPITAL LAB): SARS Coronavirus 2: NEGATIVE

## 2019-06-07 MED ORDER — ALBUTEROL SULFATE HFA 108 (90 BASE) MCG/ACT IN AERS
1.0000 | INHALATION_SPRAY | Freq: Once | RESPIRATORY_TRACT | Status: AC
  Administered 2019-06-07: 2 via RESPIRATORY_TRACT
  Filled 2019-06-07: qty 6.7

## 2019-06-07 MED ORDER — SODIUM CHLORIDE 0.9% FLUSH: 3.0000 mL | Freq: Once | INTRAVENOUS | Status: DC

## 2019-06-07 NOTE — ED Triage Notes (Signed)
Pt reports he started feeling bad around 11 am. Pt reports feeling short of breath. Pt denies CP. Pt reports feeling weak, he denies fever, or new cough, hx of emphysema.

## 2019-06-07 NOTE — ED Provider Notes (Signed)
[facility] EMERGENCY DEPARTMENT Provider Note   CSN: [id number] Arrival date & time: 06/07/19  1611    History   Chief Complaint Chief Complaint  Patient presents with  . Weakness  . Shortness of Breath    HPI [name] is a 85 y.o. male past medical history significant for A. fib on warfarin, COPD with emphysema, type 2 diabetes, hypertension, systolic CHF, CVA with chief complaint of weakness and shortness of breath.  This happened 5 hours to arrival.  Patient states he was driving in the car when he started to feel bad.  He describes as just feeling weak all over.  He also felt short of breath and had a productive cough with clear sputum sputum.  He denies any associated chest pain, fever, abdominal pain, nausea, vomiting, diaphoresis.  He not take any medications for symptoms prior to arrival. He states he is now feeling back to his normal self after getting some rest at home. Besides his daily medications, he did not take any OTC medications for his symptoms prior to arrival.  Chart review shows he is currently taking 7 day course of doxycycline for lung infection prescribed by pulmonologist. He also had an echo in 09/19/2018 with estimated ejection fraction 30-35%. He has biventricular ICD in place, cardiology note from 05/02/2019 reports defibrillator was turned off about a year ago.  He had INR checked on 06/04/2019 and it is 3.2, he skipped dose that night and then resumed next day.   Past Medical History:  Diagnosis Date  . Atrial fibrillation ([place])    persistent, previously seen at [facility] and placed on amiodarone  . Benign prostatic hypertrophy   . CAD (coronary artery disease)    multivessel s/p inferolateral wall MI with subsequent CABG 11/1998.  Cath 2009 with Patent grafts  . Cleft palate   . COPD with emphysema ([place]) 04/01/2010  . DM (diabetes mellitus), type 2 ([place])   . Dyspnea   . GERD (gastroesophageal reflux disease)   . HTN (hypertension)    . Hyperlipidemia   . Hypothyroidism   . Iron deficiency anemia   . Ischemic dilated cardiomyopathy ([place])    EF 35-40% by MUGA 6/11  . Myocardial infarction ([place])   . Nasal septal deviation   . Nephrolithiasis   . OSA (obstructive sleep apnea)   . PAF (paroxysmal atrial fibrillation) ([place])   . Peripheral arterial disease (H[facility])    left subclavian artery stenosis  . PNA (pneumonia)   . Psoriasis   . Seborrheic keratosis   . Stroke ([place])   . Systolic congestive heart failure ([place]) 2009   s/p BiV ICD implantation by Dr Leonia Reeves (MDT)    Patient Active Problem List   Diagnosis Date Noted  . Pulmonary nodule 05/17/2019  . Hemoptysis 05/17/2019  . ICD (implantable cardioverter-defibrillator) lead failure, sequela   . Acute on chronic systolic CHF (congestive heart failure), NYHA class 4 ([place]) 01/08/2019  . CKD stage 3 due to type 2 diabetes mellitus ([place]) 01/08/2019  . Dehydration 01/08/2019  . Leukocytosis 01/08/2019  . Acute on chronic respiratory failure with hypoxia ([place]) 10/04/2018  . HCAP (healthcare-associated pneumonia) 09/17/2018  . Severe sepsis with septic shock ([place]) 07/13/2018  . Supratherapeutic INR 07/13/2018  . Hypoxia 07/13/2018  . Hypothyroidism 07/13/2018  . Pressure injury of skin 07/13/2018  . Community acquired pneumonia 09/03/2016  . Acute respiratory failure ([place]) 09/03/2016  . SOB (shortness of breath)   . A-fib ([place]) 06/11/2016  . Atrial fibrillation,  persistent   . Allergic rhinitis 02/05/2016  . Long term (current) use of anticoagulants [Z79.01] 01/19/2016  . Chronic systolic heart failure ([place])   . Acute respiratory failure with hypoxia ([place]) 01/11/2016  . Acute on chronic systolic heart failure ([place]) 01/11/2016  . Demand ischemia ([place]) 01/11/2016  . Acute renal failure superimposed on stage 3 chronic kidney disease ([place]) 01/11/2016  . Diabetes mellitus with diabetic neuropathy, with long-term current use of insulin ([place]) 01/11/2016  . Morbid  obesity due to excess calories ([place]) 01/11/2016  . COPD (chronic obstructive pulmonary disease) ([place]) 01/10/2016  . Coronary artery disease   . Ischemic dilated cardiomyopathy ([place])   . PAF (paroxysmal atrial fibrillation) ([place])   . OSA (obstructive sleep apnea)   . Peripheral vascular disease ([place]) 10/02/2013  . Long term current use of anticoagulant therapy 08/21/2013  . Essential hypertension 04/01/2010  . Automatic implantable cardioverter-defibrillator in situ 03/31/2010    Past Surgical History:  Procedure Laterality Date  . BI-VENTRICULAR IMPLANTABLE CARDIOVERTER DEFIBRILLATOR  (CRT-D)  10-08-08; 11-06-2013   Dr Leonia Reeves (MDT) implant for primary prevention; gen change to MDT VivaXT CRTD by Dr Rayann Heman  . BIV ICD GENERTAOR CHANGE OUT N/A 11/06/2013   Procedure: BIV ICD GENERTAOR CHANGE OUT;  Surgeon: Coralyn Mark, MD;  Location: [facility] CATH LAB;  Service: Cardiovascular;  Laterality: N/A;  . c-spine surgery    . CARDIOVERSION N/A 04/29/2016   Procedure: CARDIOVERSION;  Surgeon: Pixie Casino, MD;  [facility] ENDOSCOPY;  Service: Cardiovascular;  Laterality: N/A;  . CARPAL TUNNEL RELEASE    . CATARACT EXTRACTION    . CORONARY ARTERY BYPASS GRAFT     LIMA to LAD, SVG to OM, SVG to diagonal  . ELECTROPHYSIOLOGIC STUDY N/A 06/11/2016   Procedure: Atrial Fibrillation Ablation;  Surgeon: Thompson Grayer, MD;  Location: [facility] CV LAB;  Service: Cardiovascular;  Laterality: N/A;  . left cleft palate and left cleft lip repair          Home Medications    Prior to Admission medications   Medication Sig Start Date End Date Taking? Authorizing Provider  acetaminophen (TYLENOL) 500 MG tablet Take 500 mg by mouth at bedtime.    Yes [provider]  albuterol (PROAIR HFA) 108 (90 Base) MCG/ACT inhaler Inhale 2 puffs into the lungs every 6 (six) hours as needed for wheezing or shortness of breath.   Yes [provider]  albuterol (PROVENTIL) (2.5 MG/3ML) 0.083% nebulizer  solution USE 1 VIAL VIA NEBULIZER 3 TIMES A DAY AS DIRECTED. DX:J44.9 Patient taking differently: Take 2.5 mg by nebulization See admin instructions. Nebulize 1 vial every morning and an additional two times daily as needed for shortness of breath or wheezing (DX: J44.9) 08/21/18  Yes Chesley Mires, MD  amiodarone (PACERONE) 200 MG tablet Take 1 tablet (200 mg total) by mouth daily. 05/05/19  Yes Kathyrn Drown D, NP  atorvastatin (LIPITOR) 80 MG tablet TAKE 1 TABLET (80 MG TOTAL) BY MOUTH DAILY. Patient taking differently: Take 80 mg by mouth daily.  07/10/18  Yes Jettie Booze, MD  carvedilol (COREG) 3.125 MG tablet Take 1 tablet (3.125 mg total) by mouth 2 (two) times daily with a meal. 05/05/19  Yes Kathyrn Drown D, NP  Cholecalciferol (VITAMIN D3) 50 MCG (2000 UT) capsule Take 2,000 Units by mouth every evening.   Yes [provider]  dextromethorphan-guaiFENesin (MUCINEX DM) 30-600 MG 12hr tablet Take 1 tablet by mouth 2 (two) times daily as needed for cough.  Yes [provider]  doxycycline (VIBRA-TABS) 100 MG tablet Take 1 tablet (100 mg total) by mouth 2 (two) times daily. Patient taking differently: Take 100 mg by mouth 2 (two) times daily. FOR 7 DAYS 05/31/19  Yes Magdalen Spatz, NP  feeding supplement, ENSURE ENLIVE, (ENSURE ENLIVE) LIQD Take 237 mLs by mouth 2 (two) times daily between meals. 10/07/18  Yes Ghimire, Henreitta Leber, MD  ferrous sulfate 325 (65 FE) MG tablet Take 325 mg by mouth every evening.    Yes [provider]  fluticasone (FLONASE) 50 MCG/ACT nasal spray SPRAY 2 SPRAYS INTO EACH NOSTRIL EVERY DAY Patient taking differently: Place 2 sprays into both nostrils every morning.  08/16/18  Yes Chesley Mires, MD  furosemide (LASIX) 40 MG tablet Take 1 tablet (40 mg total) by mouth 2 (two) times daily. 05/11/19  Yes Jettie Booze, MD  Insulin Degludec-Liraglutide (XULTOPHY) 100-3.6 UNIT-MG/ML SOPN Inject 14 Units into the skin every morning.     Yes [provider]  levothyroxine (SYNTHROID, LEVOTHROID) 100 MCG tablet Take 100 mcg by mouth daily before breakfast.  09/23/15  Yes [provider]  Multiple Vitamins-Minerals (PRESERVISION AREDS PO) Take 1 capsule by mouth 2 (two) times daily.    Yes [provider]  potassium chloride 20 MEQ TBCR Take 20 mEq by mouth daily. 07/15/18  Yes Thurnell Lose, MD  PRESCRIPTION MEDICATION See admin instructions. CPAP- At bedtime   Yes [provider]  senna (SENOKOT) 8.6 MG tablet Take 1 tablet by mouth 2 (two) times daily. HOLD FOR LOOSE STOOLS   Yes [provider]  traMADol (ULTRAM) 50 MG tablet Take 50 mg by mouth at bedtime.  06/07/18  Yes [provider]  TRELEGY ELLIPTA 100-62.5-25 MCG/INH AEPB INHALE 1 PUFF INTO LUNGS ONCE A DAY Patient taking differently: Inhale 1 puff into the lungs daily.  07/06/18  Yes Chesley Mires, MD  vitamin B-12 (CYANOCOBALAMIN) 1000 MCG tablet Take 1,000 mcg by mouth daily.   Yes [provider]  warfarin (COUMADIN) 5 MG tablet Take as directed by [facility]mad[facility] Clinic Patient taking differently: Take 5 mg by mouth daily after supper.  03/22/19  Yes Jettie Booze, MD    Family History Family History  Problem Relation Age of Onset  . Asthma Father   . Stroke Father   . Hypertension Father   . Hypertension Mother   . Heart disease Brother   . Heart attack Brother   . Hypertension Sister   . Hypertension Brother     Social History Social History   Tobacco Use  . Smoking status: Former Smoker    Packs/day: 3.00    Years: 65.00    Pack years: 195.00    Types: Cigarettes    Quit date: 10/25/1998    Years since quitting: 20.6  . Smokeless tobacco: Never Used  Substance Use Topics  . Alcohol use: No    Alcohol/week: 0.0 standard drinks    Comment: remote history of heavy alcohol use  . Drug use: No     Allergies   Adhesive [tape], Aldactone [spironolactone], and Losartan   Review of  Systems Review of Systems  Constitutional: Negative for chills and fever.  HENT: Negative for congestion, rhinorrhea, sinus pressure and sore throat.   Eyes: Negative for pain and redness.  Respiratory: Positive for cough and shortness of breath. Negative for wheezing.   Cardiovascular: Negative for chest pain and palpitations.  Gastrointestinal: Negative for abdominal pain, constipation, diarrhea, nausea and vomiting.  Genitourinary: Negative for dysuria.  Musculoskeletal: Negative for arthralgias, back pain, myalgias and neck pain.  Skin: Negative for rash and wound.  Neurological: Positive for weakness. Negative for dizziness, syncope, numbness and headaches.  Psychiatric/Behavioral: Negative for confusion.     Physical Exam Updated Vital Signs BP 133/74   Pulse 91   Temp 97.7 F (36.5 C) (Oral)   Resp 15   SpO2 94%   Physical Exam Vitals signs and nursing note reviewed.  Constitutional:      General: He is not in acute distress.    Appearance: He is not ill-appearing.  HENT:     Head: Normocephalic and atraumatic.     Right Ear: Tympanic membrane and external ear normal.     Left Ear: Tympanic membrane and external ear normal.     Nose: Nose normal.     Mouth/Throat:     Mouth: Mucous membranes are moist.     Pharynx: Oropharynx is clear.  Eyes:     General: No scleral icterus.       Right eye: No discharge.        Left eye: No discharge.     Extraocular Movements: Extraocular movements intact.     Conjunctiva/sclera: Conjunctivae normal.     Pupils: Pupils are equal, round, and reactive to light.  Neck:     Musculoskeletal: Normal range of motion.     Vascular: No JVD.  Cardiovascular:     Rate and Rhythm: Normal rate and regular rhythm.     Pulses: Normal pulses.          Radial pulses are 2+ on the right side and 2+ on the left side.     Heart sounds: Normal heart sounds.  Pulmonary:     Comments: Expiratory wheeze in bilateral lung bases. Symmetric chest  rise. No rales or rhonchi. SpO2 is 98% on room air during interview and physical. No increased work of breathing Abdominal:     Comments: Abdomen is soft, non-distended, and non-tender in all quadrants. No rigidity, no guarding. No peritoneal signs.  Musculoskeletal: Normal range of motion.     Right lower leg: Edema present.     Left lower leg: Edema present.  Skin:    General: Skin is warm and dry.     Capillary Refill: Capillary refill takes less than 2 seconds.  Neurological:     Mental Status: He is oriented to person, place, and time.     GCS: GCS eye subscore is 4. GCS verbal subscore is 5. GCS motor subscore is 6.     Comments: Fluent speech, no facial droop.  Psychiatric:        Behavior: Behavior normal.      ED Treatments / Results  Labs (all labs ordered are listed, but only abnormal results are displayed) Labs Reviewed  BASIC METABOLIC PANEL - Abnormal; Notable for the following components:      Result Value   Glucose, Bld 134 (*)    Creatinine, Ser 1.35 (*)    GFR calc non Af Amer 48 (*)    GFR calc Af Amer 56 (*)    All other components within normal limits  CBG MONITORING, ED - Abnormal; Notable for the following components:   Glucose-Capillary 102 (*)    All other components within normal limits  SARS CORONAVIRUS 2 (HOSPITAL ORDER, [place])  CBC  URINALYSIS, ROUTINE W REFLEX MICROSCOPIC    EKG EKG Interpretation  Date/Time:  Thursday June 07 2019 16:20:47 EDT Ventricular Rate:  87 PR Interval:  134 QRS Duration: 126 QT Interval:  408 QTC Calculation: 490 R Axis:   -48 Text Interpretation:  Electronic ventricular pacemaker No significant change since last tracing Confirmed by Wandra Arthurs [phone]) on 06/07/2019 8:36:35 PM   Radiology Dg Chest Portable 1 View  Result Date: 06/07/2019 CLINICAL DATA:  Short of breath. Emphysema. COPD. Hypertension. Diabetes. EXAM: PORTABLE CHEST 1 VIEW COMPARISON:  05/16/2019 FINDINGS:  Midline trachea. Lower cervical spine fixation. Moderate cardiomegaly. Atherosclerosis in the transverse aorta. Small right and trace left pleural effusions are similar. Chronic interstitial thickening. Similar right greater than left base airspace disease. IMPRESSION: No significant change since 05/16/2019. Hyperinflation and chronic interstitial thickening. Cardiomegaly without overt congestive failure. Right greater than left pleural effusions with residual or recurrent right greater than left base airspace disease. Favor atelectasis. Cannot exclude right base infection or aspiration. Aortic Atherosclerosis (ICD10-I70.0). Electronically Signed   By: Abigail Miyamoto M.D.   On: 06/07/2019 20:55    Procedures Procedures (including critical care time)  Medications Ordered in ED Medications  albuterol (VENTOLIN HFA) 108 (90 Base) MCG/ACT inhaler 1-2 puff (2 puffs Inhalation Given 06/07/19 2316)     Initial Impression / Assessment and Plan / ED Course  I have reviewed the triage vital signs and the nursing notes.  Pertinent labs & imaging results that were available during my care of the patient were reviewed by me and considered in my medical decision making (see chart for details).  9 male presents for generalized weakness and shortness of breath. He is currently taking doxycycline for lung infection prescribed by pulmonologist, has 3 pills left. On presentation he is asymptomatic and reports feeling much improved. He is afebrile, non toxic appearing. No tachycardia or hypoxia. Wheezing noted in bilateral lung bases. No signs of volume overload on exam. Albuterol inhaler improved wheezing. Chest xray viewed by myself and ED attending Dr. Darl Householder is largely unchanged from prior 05/16/19. There is right greater than left pleural effusions. Given this looks improved, pt is afebrile, no leukocytosis and overall unremarkable blood work, covid negative, pt ambulates without distress and SpO2 ranged from 93-96%,  we feel that he is safe to be discharged home with symptomatic care and pcp/pumlonology follow up. The patient appears reasonably screened and/or stabilized for discharge and I doubt any other medical condition or other [facility] requiring further screening, evaluation, or treatment in the ED at this time prior to discharge.  Strict return precautions discussed. The patient was discussed with and seen by Dr. Darl Householder who agrees with the treatment plan.   [name] was evaluated in Emergency Department on 06/08/2019 for the symptoms described in the history of present illness. He was evaluated in the context of the global COVID-19 pandemic, which necessitated consideration that the patient might be at risk for infection with the SARS-CoV-2 virus that causes COVID-19. Institutional protocols and algorithms that pertain to the evaluation of patients at risk for COVID-19 are in a state of rapid change based on information released by regulatory bodies including the CDC and federal and state organizations. These policies and algorithms were followed during the patient's care in the ED.  This note was prepared using Dragon voice recognition software and may include unintentional dictation errors due to the inherent limitations of voice recognition software.           Final Clinical Impressions(s) / ED Diagnoses   Final diagnoses:  Weakness    ED Discharge Orders  None       Flint Melter 06/08/19 1154    Drenda Freeze, MD 06/08/19 [phone]

## 2019-06-07 NOTE — ED Notes (Signed)
[phone] WIFE PLEASE CALL WHEN SHE CAN COME BACK. [name]

## 2019-06-07 NOTE — ED Notes (Signed)
The pts  Wife reports that his heart rate has been irregular  He has a history of af  Which is the rhythm  He is currently

## 2019-06-07 NOTE — ED Notes (Signed)
Checked CBG 102

## 2019-06-07 NOTE — ED Notes (Signed)
Pt is here he stepped outside.

## 2019-06-07 NOTE — ED Notes (Signed)
Pt came back inside

## 2019-06-07 NOTE — ED Notes (Signed)
Elva Breaker (Wife# [phone]) called for an update.

## 2019-06-08 NOTE — Discharge Instructions (Addendum)
You have been seen today for weakness and shortness of breath. Please read and follow all provided instructions. Return to the emergency room for worsening condition or new concerning symptoms.    -You tested negative for coronavirus today  1. Medications:  No new medications prescribed at today's visit. Please finish taking doxycycline as prescribed.  Continue usual home medications  Take medications as prescribed. Please review all of the medicines and only take them if you do not have an allergy to them.   2. Treatment: rest, drink plenty of fluids  3. Follow Up: Please follow up with your primary doctor in 2-5 days for discussion of your diagnoses and further evaluation after today's visit; Call today to arrange your follow up.    ?

## 2019-06-12 DIAGNOSIS — R531 Weakness: Secondary | ICD-10-CM | POA: Diagnosis not present

## 2019-06-12 DIAGNOSIS — J449 Chronic obstructive pulmonary disease, unspecified: Secondary | ICD-10-CM | POA: Diagnosis not present

## 2019-06-13 ENCOUNTER — Other Ambulatory Visit: Payer: Self-pay

## 2019-06-13 ENCOUNTER — Ambulatory Visit (INDEPENDENT_AMBULATORY_CARE_PROVIDER_SITE_OTHER): Payer: HMO | Admitting: Acute Care

## 2019-06-13 ENCOUNTER — Encounter: Payer: Self-pay | Admitting: Acute Care

## 2019-06-13 DIAGNOSIS — J441 Chronic obstructive pulmonary disease with (acute) exacerbation: Secondary | ICD-10-CM | POA: Diagnosis not present

## 2019-06-13 DIAGNOSIS — I129 Hypertensive chronic kidney disease with stage 1 through stage 4 chronic kidney disease, or unspecified chronic kidney disease: Secondary | ICD-10-CM | POA: Diagnosis not present

## 2019-06-13 DIAGNOSIS — I5022 Chronic systolic (congestive) heart failure: Secondary | ICD-10-CM | POA: Diagnosis not present

## 2019-06-13 DIAGNOSIS — N400 Enlarged prostate without lower urinary tract symptoms: Secondary | ICD-10-CM | POA: Diagnosis not present

## 2019-06-13 DIAGNOSIS — E1142 Type 2 diabetes mellitus with diabetic polyneuropathy: Secondary | ICD-10-CM | POA: Diagnosis not present

## 2019-06-13 DIAGNOSIS — N183 Chronic kidney disease, stage 3 (moderate): Secondary | ICD-10-CM | POA: Diagnosis not present

## 2019-06-13 DIAGNOSIS — E1122 Type 2 diabetes mellitus with diabetic chronic kidney disease: Secondary | ICD-10-CM | POA: Diagnosis not present

## 2019-06-13 DIAGNOSIS — E109 Type 1 diabetes mellitus without complications: Secondary | ICD-10-CM | POA: Diagnosis not present

## 2019-06-13 DIAGNOSIS — I5023 Acute on chronic systolic (congestive) heart failure: Secondary | ICD-10-CM | POA: Diagnosis not present

## 2019-06-13 DIAGNOSIS — J449 Chronic obstructive pulmonary disease, unspecified: Secondary | ICD-10-CM | POA: Diagnosis not present

## 2019-06-13 DIAGNOSIS — I480 Paroxysmal atrial fibrillation: Secondary | ICD-10-CM | POA: Diagnosis not present

## 2019-06-13 DIAGNOSIS — I25118 Atherosclerotic heart disease of native coronary artery with other forms of angina pectoris: Secondary | ICD-10-CM | POA: Diagnosis not present

## 2019-06-13 DIAGNOSIS — E039 Hypothyroidism, unspecified: Secondary | ICD-10-CM | POA: Diagnosis not present

## 2019-06-13 NOTE — Progress Notes (Addendum)
Virtual Visit via Telephone Note  I connected with [name] on 06/13/19 at  1:30 PM EDT by telephone and verified that I am speaking with the correct person using two identifiers.  Location: Patient: At Home Provider: [place], [place], [place] [address]   I discussed the limitations, risks, security and privacy concerns of performing an evaluation and management service by telephone and the availability of in person appointments. I also discussed with the patient that there may be a patient responsible charge related to this service. The patient expressed understanding and agreed to proceed.  Synopsis [name] is a 85 y.o. male former smoker ( 195 pack year smoking history, Quit 2000) with  past medical history significant for A. fib on warfarin, COPD with emphysema, type 2 diabetes, hypertension, systolic CHF. He is followed by Dr. Halford Chessman.  Smoker 3 pack of cigarettes per day x 65 years>> 195 pack year smoking history  History of Present Illness: Pt. Had recent ED visit 06/07/2019 for chief complaint of weakness and shortness of breath. He  had a productive cough with clear sputum sputum.  He denied any associated chest pain, fever, abdominal pain, nausea, vomiting, diaphoresis.The event self resolved .He was on a 7 day  Doxycycline course prescribed by pulmonology for a suspected COPD flare. He had 3 pills left to take. Saturations were 93-98% on RA. He did have wheezing on exam. He was treated with albuterol nebs. There was no sign of volume overload on exam. Chest x ray  was largely unchanged from prior 05/16/19. There is right greater than left pleural effusions. Given this looks improved, pt was afebrile, no leukocytosis and overall unremarkable blood work, covid negative He was discharged home with albuterol and  instructions to follow up with pulmonary.    He had been seen by pulmonary 05/16/2019 for hemoptysis and abnormal CT chest with 08/2018 withIrregular 1.1 x  1.3 x 1.9 cm LEFT UPPER lobe nodule worrisome for Malignancy. PET scan showed spiculated left upper lobe pulmonary nodule from the 09/17/2018 chest CT is markedly improved to resolved, without correlate hypermetabolism. This is most consistent with a resolved focus of inflammation/infection. There was a New right-sided pleural effusion with right lung base pulmonary opacities and mild hypermetabolism. Most likely foci of atelectasis. He needs close follow up  Of note patient has had a 75 pound weight loss over the last year.    Pt. Presents today via tele visit. He states he has been  doing  well. He states he has not had any further hemoptysis.He completed his Doxycycline treatment. He states secretions now are clear. He has had no further shortness of breath.He denies any fever of chest pain. He denies wheezing. He is using his albuterol neb treatments in the morning with his Trelegy. He uses his rescue inhaler an additional 3 times a day on average. He states his feet are not  swollen. He feels he does not have any additional fluid. He is compliant with his Lasix 40 mg twice daily. He feels he is at his baseline  He has had no further hemoptysis.  Observations/Objective: PET Scan 05/30/2019 The spiculated left upper lobe pulmonary nodule from the 09/17/2018 chest CT is markedly improved to resolved, without correlate hypermetabolism. This is most consistent with a resolved focus of inflammation/infection. New right-sided pleural effusion with right lung base pulmonary opacities and mild hypermetabolism. Most likely foci of atelectasis. Concurrent infection cannot be excluded. Aortic atherosclerosis (ICD10-I70.0), coronary artery atherosclerosis and emphysema (  ICD10-J43.9). Incidental findings, including left nephrolithiasis, cholelithiasis, and prostatomegaly.  CXR 7/9 >Enlargement of cardiac silhouette post CABG and AICD with pulmonary vascular congestion. Small bibasilar pleural  effusions and atelectasis greater on RIGHT. No definite acute infiltrate.  08/2018 CT Chest Irregular 1.1 x 1.3 x 1.9 cm LEFT UPPER lobe nodule worrisome for malignancy. Consider one of the following for both low-risk and high-risk individuals: (a) repeat chest CT in 3 months, (b) follow-up PET-CT, or (c) tissue sampling.   Pulmonary tests: PFT 03/30/11>>FEV1 1.98(72%), FEV1% 68, DLCO 64%  PFT 04/19/12>>FEV1 1.65 (67%), FEV1% 63, TLC 5.66 (99%), DLCO 71%, no BD. PFT 12/19/13 >> FEV1 2.03 (77%), FEV1% 72, TLC 6.45 (98%), DLCO 49% CT chest 01/18/14 >> mild centrilobular and paraseptal emphysema, mild fibrotic changes Rt periphery CT chest 01/15/16 >> emphysema  Cardiac tests Echo 01/11/16 EF 98-33%, grade 2 diastolic dysfunction. LA moderately to severely dilated PA Pressure 23 mm Hg  Sleep tests: PSG 06/14/06>>AHI 35.5  Auto CPAP 05/19/16 to 06/17/16 >>used on 28 to 30 nights with average 6 hrs 57 min. Average AHI 18.7 with median CPAP 8 and 95 th percentile CPAP 11 cm H2O. CPAP titration 06/23/16 >> CPAP 16 cm H2O >> AHI 6.7 CPAP 02/11/17 to 05/11/17 >>used on 76 of 90 nights with average 6 hrs 4 min. Average AHI 12 with CPAP 16 cm H2O. Air leak  Assessment and Plan: COPD (chronic obstructive pulmonary disease) (HCC) Currently stable with compliance with Trelegy No further shortness of breath after ED visit 8/13 Plan ContinueTrelegy  PRN Albuteroland Flonase, and rescue inhaler as needed  Note your daily symptoms >remember "red flags" for COPD: Increase in cough, increase in sputum production, increase in shortness of breath or activity intolerance. If you notice these symptoms, please call to be seen.   Pleural Effusions on CXR  06/07/2019 Plan Follow up CXR prn Follow up in 3 months with Dr. Halford Chessman or Judson Roch NP, or sooner follow up  as needed. Continue Lasix as prescribed  Pulmonary nodule/ Hemoptysis Irregular LUL nodule worrisome for malignancy>> PET 08/2019 scan without  hypermetabolic activity. Will need close follow up.  Plan -CT Chest 08/2018 withIrregular 1.1 x 1.3 x 1.9 cm LEFT UPPER lobe nodule worrisome for Malignancy but resolved on follow up PET>> suspect inflammatory/ infection >>No evidence of correlate hypermetabolism.  Hemoptysis 7/13 >> Self Resolved No further hemoptysis since Plan: Use cough Supressant for cough Seek Emergency care for any coughing up of blood that exceeds 1/4 cup.  Follow Up Instructions: Follow up with Dr. Halford Chessman or Judson Roch NP in 3 months ( Nov 2020) Consider repeat imaging to ensure continued resolution of pulmonary nodule   I discussed the assessment and treatment plan with the patient. The patient was provided an opportunity to ask questions and all were answered. The patient agreed with the plan and demonstrated an understanding of the instructions.   The patient was advised to call back or seek an in-person evaluation if the symptoms worsen or if the condition fails to improve as anticipated.  I provided 25 minutes of non-face-to-face time during this encounter.   Magdalen Spatz, NP 06/13/2019 1:51 PM

## 2019-06-18 ENCOUNTER — Ambulatory Visit (INDEPENDENT_AMBULATORY_CARE_PROVIDER_SITE_OTHER): Payer: HMO | Admitting: *Deleted

## 2019-06-18 DIAGNOSIS — I5022 Chronic systolic (congestive) heart failure: Secondary | ICD-10-CM

## 2019-06-18 DIAGNOSIS — I4819 Other persistent atrial fibrillation: Secondary | ICD-10-CM

## 2019-06-19 ENCOUNTER — Telehealth: Payer: Self-pay

## 2019-06-19 LAB — CUP PACEART REMOTE DEVICE CHECK
Battery Remaining Longevity: 6 mo
Battery Voltage: 2.82 V
Brady Statistic AP VP Percent: 26.2 %
Brady Statistic AP VS Percent: 0.58 %
Brady Statistic AS VP Percent: 34.88 %
Brady Statistic AS VS Percent: 38.34 %
Brady Statistic RA Percent Paced: 25.52 %
Brady Statistic RV Percent Paced: 50.08 %
Date Time Interrogation Session: 20200824041803
HighPow Impedance: 51 Ohm
HighPow Impedance: 64 Ohm
Implantable Lead Implant Date: 20091215
Implantable Lead Implant Date: 20091215
Implantable Lead Implant Date: 20091215
Implantable Lead Location: 753858
Implantable Lead Location: 753859
Implantable Lead Location: 753860
Implantable Lead Model: 4196
Implantable Lead Model: 5076
Implantable Lead Model: 6947
Implantable Pulse Generator Implant Date: 20150113
Lead Channel Impedance Value: 4047 Ohm
Lead Channel Impedance Value: 456 Ohm
Lead Channel Impedance Value: 532 Ohm
Lead Channel Impedance Value: 589 Ohm
Lead Channel Impedance Value: 589 Ohm
Lead Channel Impedance Value: 988 Ohm
Lead Channel Pacing Threshold Amplitude: 0.75 V
Lead Channel Pacing Threshold Amplitude: 0.875 V
Lead Channel Pacing Threshold Amplitude: 1.875 V
Lead Channel Pacing Threshold Pulse Width: 0.4 ms
Lead Channel Pacing Threshold Pulse Width: 0.4 ms
Lead Channel Pacing Threshold Pulse Width: 0.6 ms
Lead Channel Sensing Intrinsic Amplitude: 1.125 mV
Lead Channel Sensing Intrinsic Amplitude: 1.125 mV
Lead Channel Sensing Intrinsic Amplitude: 10.75 mV
Lead Channel Sensing Intrinsic Amplitude: 10.75 mV
Lead Channel Setting Pacing Amplitude: 1.25 V
Lead Channel Setting Pacing Amplitude: 1.75 V
Lead Channel Setting Pacing Amplitude: 2.5 V
Lead Channel Setting Pacing Pulse Width: 0.6 ms
Lead Channel Setting Pacing Pulse Width: 1 ms
Lead Channel Setting Sensing Sensitivity: 0.3 mV

## 2019-06-19 NOTE — Telephone Encounter (Signed)
Spoke to pt wife regarding alert for AT. Presenting rhythm is AT, burden is 55.4%. Wife states that pt has felt weak recently and is feeling SOB today. Will route to Dr. Rayann Heman for review.

## 2019-06-20 ENCOUNTER — Ambulatory Visit (INDEPENDENT_AMBULATORY_CARE_PROVIDER_SITE_OTHER): Payer: HMO

## 2019-06-20 ENCOUNTER — Other Ambulatory Visit: Payer: Self-pay

## 2019-06-20 ENCOUNTER — Telehealth: Payer: Self-pay

## 2019-06-20 DIAGNOSIS — I4819 Other persistent atrial fibrillation: Secondary | ICD-10-CM | POA: Diagnosis not present

## 2019-06-20 DIAGNOSIS — Z5181 Encounter for therapeutic drug level monitoring: Secondary | ICD-10-CM

## 2019-06-20 DIAGNOSIS — Z7901 Long term (current) use of anticoagulants: Secondary | ICD-10-CM

## 2019-06-20 DIAGNOSIS — I480 Paroxysmal atrial fibrillation: Secondary | ICD-10-CM

## 2019-06-20 LAB — POCT INR: INR: 4 — AB (ref 2.0–3.0)

## 2019-06-20 NOTE — Patient Instructions (Signed)
Please skip coumadin tonight, then START NEW DOSAGE of 1 tablet everyday EXCEPT 1/2 [place]. Recheck in 3 weeks.

## 2019-06-20 NOTE — Telephone Encounter (Signed)
YOUR CARDIOLOGY TEAM HAS ARRANGED FOR AN E-VISIT FOR YOUR APPOINTMENT - PLEASE REVIEW IMPORTANT INFORMATION BELOW SEVERAL DAYS PRIOR TO YOUR APPOINTMENT  Due to the recent COVID-19 pandemic, we are transitioning in-person office visits to tele-medicine visits in an effort to decrease unnecessary exposure to our patients, their families, and staff. These visits are billed to your insurance just like a normal visit is. We also encourage you to sign up for MyChart if you have not already done so. You will need a smartphone if possible. For patients that do not have this, we can still complete the visit using a regular telephone but do prefer a smartphone to enable video when possible. You may have a family member that lives with you that can help. If possible, we also ask that you have a blood pressure cuff and scale at home to measure your blood pressure, heart rate and weight prior to your scheduled appointment. Patients with clinical needs that need an in-person evaluation and testing will still be able to come to the office if absolutely necessary. If you have any questions, feel free to call our office.     YOUR PROVIDER WILL BE USING THE FOLLOWING PLATFORM TO COMPLETE YOUR VISIT: doxi.me  . IF USING MYCHART - How to Download the MyChart App to Your SmartPhone   - If Apple, go to [employer] and type in MyChart in the search bar and download the app. If Android, ask patient to go to [employer] and type in [place] in the search bar and download the app. The app is free but as with any other app downloads, your phone may require you to verify saved payment information or Apple/Android password.  - You will need to then log into the app with your MyChart username and password, and select [facility] as your healthcare provider to link the account.  - When it is time for your visit, go to the MyChart app, find appointments, and click Begin Video Visit. Be sure to Select Allow for your device to  access the Microphone and Camera for your visit. You will then be connected, and your provider will be with you shortly.  **If you have any issues connecting or need assistance, please contact MyChart service desk [phone])**  **If using a computer, in order to ensure the best quality for your visit, you will need to use either of the following Internet Browsers: Insurance underwriter or [employer]**  . IF USING DOXIMITY or [email or web address] - The staff will give you instructions on receiving your link to join the meeting the day of your visit.      2-3 DAYS BEFORE YOUR APPOINTMENT  You will receive a telephone call from one of our [facility] team members - your caller ID may say "Unknown caller." If this is a video visit, we will walk you through how to get the video launched on your phone. We will remind you check your blood pressure, heart rate and weight prior to your scheduled appointment. If you have an Apple Watch or Kardia, please upload any pertinent ECG strips the day before or morning of your appointment to [place]. Our staff will also make sure you have reviewed the consent and agree to move forward with your scheduled tele-health visit.     THE DAY OF YOUR APPOINTMENT  Approximately 15 minutes prior to your scheduled appointment, you will receive a telephone call from one of [facility] team - your caller ID may say "Unknown caller."  Our staff will confirm medications, vital signs for the day and any symptoms you may be experiencing. Please have this information available prior to the time of visit start. It may also be helpful for you to have a pad of paper and pen handy for any instructions given during your visit. They will also walk you through joining the smartphone meeting if this is a video visit.    CONSENT FOR TELE-HEALTH VISIT - PLEASE REVIEW  I hereby voluntarily request, consent and authorize [facility] and its employed or contracted physicians, physician  assistants, nurse practitioners or other licensed health care professionals (the Practitioner), to provide me with telemedicine health care services (the "Services") as deemed necessary by the treating Practitioner. I acknowledge and consent to receive the Services by the Practitioner via telemedicine. I understand that the telemedicine visit will involve communicating with the Practitioner through live audiovisual communication technology and the disclosure of certain medical information by electronic transmission. I acknowledge that I have been given the opportunity to request an in-person assessment or other available alternative prior to the telemedicine visit and am voluntarily participating in the telemedicine visit.  I understand that I have the right to withhold or withdraw my consent to the use of telemedicine in the course of my care at any time, without affecting my right to future care or treatment, and that the Practitioner or I may terminate the telemedicine visit at any time. I understand that I have the right to inspect all information obtained and/or recorded in the course of the telemedicine visit and may receive copies of available information for a reasonable fee.  I understand that some of the potential risks of receiving the Services via telemedicine include:  [name] Delay or interruption in medical evaluation due to technological equipment failure or disruption; . Information transmitted may not be sufficient (e.g. poor resolution of images) to allow for appropriate medical decision making by the Practitioner; and/or  . In rare instances, security protocols could fail, causing a breach of personal health information.  Furthermore, I acknowledge that it is my responsibility to provide information about my medical history, conditions and care that is complete and accurate to the best of my ability. I acknowledge that Practitioner's advice, recommendations, and/or decision may be based on  factors not within their control, such as incomplete or inaccurate data provided by me or distortions of diagnostic images or specimens that may result from electronic transmissions. I understand that the practice of medicine is not an exact science and that Practitioner makes no warranties or guarantees regarding treatment outcomes. I acknowledge that I will receive a copy of this consent concurrently upon execution via email to the email address I last provided but may also request a printed copy by calling the office of [facility].    I understand that my insurance will be billed for this visit.   I have read or had this consent read to me. . I understand the contents of this consent, which adequately explains the benefits and risks of the Services being provided via telemedicine.  . I have been provided ample opportunity to ask questions regarding this consent and the Services and have had my questions answered to my satisfaction. . I give my informed consent for the services to be provided through the use of telemedicine in my medical care  By participating in this telemedicine visit I agree to the above.

## 2019-06-22 NOTE — Telephone Encounter (Signed)
Please call and follow-up with patient

## 2019-06-25 NOTE — Telephone Encounter (Signed)
Spoke with the pts wife in response to the pts device showing AT.[name]  Pt has been having increased SOB with minimal exertion.[name] He is having weakness with his normal ADL's and it has been difficult for him to use his walker with his weakness. The pt denies palpitations, some mild ankle edema that improves with elevation. Pt also denies dizziness and syncope.    He has been to the ER recently 06/07/19 with c/o weakness and SOB.[name] they were advised to go after talking with our office. He was recently being treated for Bronchitis. Pt has some wheezing while there treated with Albuterol. Normal CXR... he was told to follow up with Pulmonary for a pulmonary nodule....Dr. Shea Stakes NP... he had a televisit with her 06/13/19.   He has an appt with Sharman Cheek 06/26/19 and a Televisit with [name] 06/27/19 but the pts wife is not sure she is happy with it not being in person.   Will forward to Dr. Rayann Heman for review.

## 2019-06-25 NOTE — Progress Notes (Signed)
Remote ICD transmission.   

## 2019-06-25 NOTE — Addendum Note (Signed)
Addended by: Douglass Rivers D on: 06/25/2019 11:50 AM   Modules accepted: Level of Service

## 2019-06-25 NOTE — Telephone Encounter (Signed)
Ok  May benefit from cardioversion discussion when he sees Dr Irish Lack.

## 2019-06-26 ENCOUNTER — Ambulatory Visit (INDEPENDENT_AMBULATORY_CARE_PROVIDER_SITE_OTHER): Payer: HMO

## 2019-06-26 ENCOUNTER — Other Ambulatory Visit: Payer: Self-pay

## 2019-06-26 DIAGNOSIS — I5022 Chronic systolic (congestive) heart failure: Secondary | ICD-10-CM

## 2019-06-26 DIAGNOSIS — Z95810 Presence of automatic (implantable) cardiac defibrillator: Secondary | ICD-10-CM

## 2019-06-26 NOTE — Progress Notes (Signed)
Virtual Visit via Video Note   This visit type was conducted due to national recommendations for restrictions regarding the COVID-19 Pandemic (e.g. social distancing) in an effort to limit this patient's exposure and mitigate transmission in our community.  Due to his co-morbid illnesses, this patient is at least at moderate risk for complications without adequate follow up.  This format is felt to be most appropriate for this patient at this time.  All issues noted in this document were discussed and addressed.  A limited physical exam was performed with this format.  Please refer to the patient's chart for his consent to telehealth for [facility].   Date:  06/27/2019   ID:  [name], DOB 1934/06/25, MRN [id number]  Patient Location: Home Provider Location: Home  PCP:  [name], MD  Cardiologist:  Larae Grooms, MD  Electrophysiologist:  Thompson Grayer, MD   Evaluation Performed:  Follow-Up Visit  Chief Complaint:  DOE  History of Present Illness:    [name] is a 85 y.o. male who presents via audio/video conferencing for a telehealth visit today.    Who has a history of COPD on CPAP, left subclavian stenosis, CHF ( systolic, EF [id number] in [id number]), Afib s/p biventricular defibrillator, CAD s/p CABG, hypothyroidism, HTN, CVA.  Admitted 01/16/16 with acute resp. Failure with hypoxia acute COPD exacerbation RLL PNA, and acute systolic HF.Chronic systolic CHF: EF [id number]  Paroxsymal Afib At discharge in 3/17: - Currently in AV paced rhythm on amiodarone. - CHADSVASC of 8, A/C with warfarin, theraputic AoCKD3- improving - ARB held,  -Resumed home oral lasix 40mg  BID  Helaterfailed attempts at restoring NSR with Dr. Rayann Heman. Ablation was done- see below.   Hehas hadbowel obstruction. He had some type of colon cleanse with relief.   He lost 15 lbs in 2017through portion control.   He had an AFib ablation and flutter ablation in 8/17.He has  chronic DOE.  In the past,He has broken some ribs with a fall on the left chest.  He was hospitalized in 11/19 for pneumonia. Lost 70 lbs over the course of 2019.  2019 echo: Left ventricle: The cavity size was severely dilated. Systolic function was moderately to severely reduced. The estimated ejection fraction was in the range of 30% to 35%. Severe diffuse hypokinesis with distinct regional wall motion abnormalities. There is severe hypokinesis to akinesis of the inferolateral and inferior myocardium. There is hypokinesis of the lateral myocardium. There is akinesis of the apical septal, apical anterior and apical myocardium. - Aortic valve: Mildly to moderately calcified annulus. Trileaflet; normal thickness, moderately calcified leaflets. Valve area (VTI): 2.55 cm^2. Valve area (Vmax): 2.7 cm^2. Valve area (Vmean): 2.13 cm^2. - Aorta: Aortic root dimension: 39 mm (ED). - Aortic root: The aortic root was mildly dilated. - Mitral valve: There was mild regurgitation. Valve area by continuity equation (using LVOT flow): 2.29 cm^2. - Left atrium: The atrium was severely dilated. - Right atrium: The atrium was mildly dilated. - Pulmonic valve: There was trivial regurgitation. - Pulmonary arteries: PA peak pressure: 45 mm Hg (S).  Impressions:  - The right ventricular systolic pressure was increased consistent with moderate pulmonary hypertension.   Spironolactone was stopped after he had low BP. BP has been better. Coreg was also decreased.   The patient does not symptoms concerning for COVID-19 infection (fever, chills, cough, or new shortness of breath).   Stable cough and DOE.   He was hospitalized for a few days in March 2020.  He had dehydration: 85 year old male with a history of atrial fibrillation, CAD, chronic systolic heart failure, COPD, sleep apnea, hypothyroidism presented to the hospital with near syncope. Patient says he  did not lose consciousness.   Hospital Course:  1. ? Near syncope-patient denies losing consciousness. He does have a history of ischemic cardiomyopathy status post CABG with LV function 30 to 35% with severe diffuse hypokinesis.Lasix dose has been adjusted to once daily.  2. Chest wall pain/muscular-improved,tenderness to palpation on left lower rib cage,WasstartedFlexeril 5 mg p.o. 3 times daily.   3. Acute on chronic systolic CHF-resolved with IV Lasix, O2 sats are 92% on ambulation on RA,chest x-ray shows bilateral pulmonary edema, no signs and symptoms of pneumonia. Patient was started on IV Lasix and has diuresed, net -4.3L .IV Lasix was helddue to hypotension. Started onLasix 40 mg p.o. daily.Cardiologyrecommends to discharge home on Lasix 40 mg daily.  4. Hypertension-orthostatic vital signs were negative for postural hypotension. Continue Coreg.  5. Diabetes mellitus type 2-continue sliding scale insulin with NovoLog. CBG well controlled.  6. Paroxysmal atrial fibrillation-heart rate is controlled, continue amiodarone 200 mg p.o. daily. Patient was taking Coumadin at home.Continue coumadin at home.  7. Hypothyroidism-continue Synthroid  8. Dyslipidemia-continue atorvastatin.  He has had some difficulty swallowing.  Now on a more liquid diet.   Spoke with the pts wife in response to the pts device showing atrial tachycardia:..  Pt has been having increased SOB with minimal exertion.[name] He is having weakness with his normal ADL's and it has been difficult for him to use his walker with his weakness. The pt denies palpitations, some mild ankle edema that improves with elevation. Pt also denies dizziness and syncope.    He has been to the ER recently 06/07/19 with c/o weakness and SOB.[name] they were advised to go after talking with our office. He was recently being treated for Bronchitis. Pt has some wheezing while there treated with Albuterol. Normal  CXR... he was told to follow up with Pulmonary for a pulmonary nodule....Dr. Shea Stakes NP... he had a televisit with her 06/13/19.   Dr. Rayann Heman suggested consideration of a cardioversion.   He has had DOE with walks.  He has some intermittent SHOB when sitting in a chair. Home BP readings have been in the [id number] systolic range.  The patient does not have symptoms concerning for COVID-19 infection (fever, chills, cough, or new shortness of breath).    Past Medical History:  Diagnosis Date  . Atrial fibrillation ([place])    persistent, previously seen at [facility] and placed on amiodarone  . Benign prostatic hypertrophy   . CAD (coronary artery disease)    multivessel s/p inferolateral wall MI with subsequent CABG 11/1998.  Cath 2009 with Patent grafts  . Cleft palate   . COPD with emphysema ([place]) 04/01/2010  . DM (diabetes mellitus), type 2 ([place])   . Dyspnea   . GERD (gastroesophageal reflux disease)   . HTN (hypertension)   . Hyperlipidemia   . Hypothyroidism   . Iron deficiency anemia   . Ischemic dilated cardiomyopathy ([place])    EF 35-40% by MUGA 6/11  . Myocardial infarction ([place])   . Nasal septal deviation   . Nephrolithiasis   . OSA (obstructive sleep apnea)   . PAF (paroxysmal atrial fibrillation) ([place])   . Peripheral arterial disease ([facility])    left subclavian artery stenosis  . PNA (pneumonia)   . Psoriasis   . Seborrheic keratosis   . Stroke ([place])   .  Systolic congestive heart failure ([place]) 2009   s/p BiV ICD implantation by Dr Leonia Reeves (MDT)   Past Surgical History:  Procedure Laterality Date  . BI-VENTRICULAR IMPLANTABLE CARDIOVERTER DEFIBRILLATOR  (CRT-D)  10-08-08; 11-06-2013   Dr Leonia Reeves (MDT) implant for primary prevention; gen change to MDT VivaXT CRTD by Dr Rayann Heman  . BIV ICD GENERTAOR CHANGE OUT N/A 11/06/2013   Procedure: BIV ICD GENERTAOR CHANGE OUT;  Surgeon: Coralyn Mark, MD;  Location: [facility] CATH LAB;  Service: Cardiovascular;  Laterality: N/A;  . c-spine  surgery    . CARDIOVERSION N/A 04/29/2016   Procedure: CARDIOVERSION;  Surgeon: Pixie Casino, MD;  Location: [facility] ENDOSCOPY;  Service: Cardiovascular;  Laterality: N/A;  . CARPAL TUNNEL RELEASE    . CATARACT EXTRACTION    . CORONARY ARTERY BYPASS GRAFT     LIMA to LAD, SVG to OM, SVG to diagonal  . ELECTROPHYSIOLOGIC STUDY N/A 06/11/2016   Procedure: Atrial Fibrillation Ablation;  Surgeon: Thompson Grayer, MD;  Location: [facility] CV LAB;  Service: Cardiovascular;  Laterality: N/A;  . left cleft palate and left cleft lip repair       Current Meds  Medication Sig  . acetaminophen (TYLENOL) 500 MG tablet Take 500 mg by mouth at bedtime.   [name] albuterol (PROAIR HFA) 108 (90 Base) MCG/ACT inhaler Inhale 2 puffs into the lungs every 6 (six) hours as needed for wheezing or shortness of breath.  [name] albuterol (PROVENTIL) (2.5 MG/3ML) 0.083% nebulizer solution USE 1 VIAL VIA NEBULIZER 3 TIMES A DAY AS DIRECTED. DX:J44.9 (Patient taking differently: Take 2.5 mg by nebulization See admin instructions. Nebulize 1 vial every morning and an additional two times daily as needed for shortness of breath or wheezing (DX: J44.9))  . amiodarone (PACERONE) 200 MG tablet Take 1 tablet (200 mg total) by mouth daily.  [name] atorvastatin (LIPITOR) 80 MG tablet TAKE 1 TABLET (80 MG TOTAL) BY MOUTH DAILY. (Patient taking differently: Take 80 mg by mouth daily. )  . carvedilol (COREG) 3.125 MG tablet Take 1 tablet (3.125 mg total) by mouth 2 (two) times daily with a meal.  . Cholecalciferol (VITAMIN D3) 50 MCG (2000 UT) capsule Take 2,000 Units by mouth every evening.  [name] dextromethorphan-guaiFENesin (MUCINEX DM) 30-600 MG 12hr tablet Take 1 tablet by mouth 2 (two) times daily as needed for cough.  . doxycycline (VIBRA-TABS) 100 MG tablet Take 1 tablet (100 mg total) by mouth 2 (two) times daily.  . feeding supplement, ENSURE ENLIVE, (ENSURE ENLIVE) LIQD Take 237 mLs by mouth 2 (two) times daily between meals.  . ferrous sulfate  325 (65 FE) MG tablet Take 325 mg by mouth every evening.   . fluticasone (FLONASE) 50 MCG/ACT nasal spray SPRAY 2 SPRAYS INTO EACH NOSTRIL EVERY DAY (Patient taking differently: Place 2 sprays into both nostrils every morning. )  . furosemide (LASIX) 40 MG tablet Take 1 tablet (40 mg total) by mouth 2 (two) times daily.  . Insulin Degludec-Liraglutide (XULTOPHY) 100-3.6 UNIT-MG/ML SOPN Inject 14 Units into the skin every morning.   [name] levothyroxine (SYNTHROID, LEVOTHROID) 100 MCG tablet Take 100 mcg by mouth daily before breakfast.   . Multiple Vitamins-Minerals (PRESERVISION AREDS PO) Take 1 capsule by mouth 2 (two) times daily.   . potassium chloride 20 MEQ TBCR Take 20 mEq by mouth daily.  [name] PRESCRIPTION MEDICATION See admin instructions. CPAP- At bedtime  . senna (SENOKOT) 8.6 MG tablet Take 1 tablet by mouth 2 (two) times daily. HOLD FOR LOOSE STOOLS  .  traMADol (ULTRAM) 50 MG tablet Take 50 mg by mouth at bedtime.   . TRELEGY ELLIPTA 100-62.5-25 MCG/INH AEPB INHALE 1 PUFF INTO LUNGS ONCE A DAY (Patient taking differently: Inhale 1 puff into the lungs daily. )  . vitamin B-12 (CYANOCOBALAMIN) 1000 MCG tablet Take 1,000 mcg by mouth daily.  [name] warfarin (COUMADIN) 5 MG tablet Take as directed by [facility] (Patient taking differently: Take 5 mg by mouth daily after supper. )     Allergies:   Adhesive [tape], Aldactone [spironolactone], and Losartan   Social History   Tobacco Use  . Smoking status: Former Smoker    Packs/day: 3.00    Years: 65.00    Pack years: 195.00    Types: Cigarettes    Quit date: 10/25/1998    Years since quitting: 20.6  . Smokeless tobacco: Never Used  Substance Use Topics  . Alcohol use: No    Alcohol/week: 0.0 standard drinks    Comment: remote history of heavy alcohol use  . Drug use: No     Family Hx: The patient's family history includes Asthma in his father; Heart attack in his brother; Heart disease in his brother; Hypertension in his brother,  father, mother, and sister; Stroke in his father.  ROS:   Please see the history of present illness.    DOE All other systems reviewed and are negative.   Prior CV studies:   The following studies were reviewed today:  As above  Labs/Other Tests and Data Reviewed:    EKG:  An ECG dated 05/2019 was personally reviewed today and demonstrated:  V- paced  Recent Labs: 09/20/2018: Magnesium 2.1 05/02/2019: ALT 14; B Natriuretic Peptide 1,255.8 05/03/2019: TSH 2.004 05/09/2019: NT-Pro BNP 12,014 06/07/2019: BUN 23; Creatinine, Ser 1.35; Hemoglobin 13.1; Platelets 194; Potassium 4.5; Sodium 136   Recent Lipid Panel Lab Results  Component Value Date/Time   CHOL 137 11/12/2014 09:21 AM   TRIG 95 11/12/2014 09:21 AM   HDL 50 11/12/2014 09:21 AM   CHOLHDL 2.6 11/29/2008 03:30 AM   LDLCALC 68 11/12/2014 09:21 AM    Wt Readings from Last 3 Encounters:  06/27/19 188 lb (85.3 kg)  05/17/19 190 lb (86.2 kg)  05/16/19 190 lb 12.8 oz (86.5 kg)     Objective:    Vital Signs:  BP 120/67   Pulse 83   Ht 5\' 7"  (1.702 m)   Wt 188 lb (85.3 kg)   SpO2 97%   BMI 29.44 kg/m    VITAL SIGNS:  reviewed GEN:  no acute distress RESPIRATORY:  normal respiratory effort, symmetric expansion NEURO:  no gross deficits PSYCH:  normal affect exam limited by video format  ASSESSMENT & PLAN:    1. CAD: No angina.  Continue aggressive secondary prevention.  Left subclavian disease.  No weakness.   2. Hyperlipidemia: lipids controlled in June 2020. 3. Pacer/AFib/ atrial tachycardia: Could be contributing to [facility].  Discussed cardioversion with th epatient and e is in agreement.  HE has had cardioversion in the past.  4. Chronic systolic heart failure: With low EF, may not tolerate atrial tach.  5. Pacer: AT noted on last check.  6. DM: A1C 7.2.  COVID-19 Education: The signs and symptoms of COVID-19 were discussed with the patient and how to seek care for testing (follow up with PCP or arrange  E-visit).  The importance of social distancing was discussed today.  Time:   Today, I have spent 20 minutes with the patient with telehealth technology discussing  the above problems.     Medication Adjustments/Labs and Tests Ordered: Current medicines are reviewed at length with the patient today.  Concerns regarding medicines are outlined above.   Tests Ordered: No orders of the defined types were placed in this encounter.   Medication Changes: No orders of the defined types were placed in this encounter.   Follow Up:  Virtual Visit or In Person in 4 week(s)  Signed, Larae Grooms, MD  06/27/2019 8:40 AM    [place]

## 2019-06-26 NOTE — Patient Outreach (Signed)
  [facility]) Care Management Chronic Special Needs Program  06/26/2019  Name: [name] DOB: Apr 16, 1934  MRN: [id number]  [name] is enrolled in a chronic special needs plan for Heart Failure. Reviewed and updated care plan.  Client is actively engaged with [facility] for Complex community care management. Per [facility], follow up per admission for atrial fibrillation complete. Client Visit with primary care completed on 06/12/2019. Next home visit scheduled with [facility] provider on 07/20/2019  Goals Addressed            This Visit's Progress   . COMPLETED: Client understands the importance of follow-up with providers by attending scheduled visits       Per chart attends follow up visit as scheduled.    . COMPLETED: Client will not be readmitted within 30 days (C-SNP)discharge date 05/05/2019   On track    Goal completed-no readmission.     . Client will not report change from baseline and no repeated symptoms of stroke with in the next 6-9 months    On track   . COMPLETED: Client will report no fall or injuries in the next 6-9 months.   On track    Per record no fall    . Client will report no worsening of symptoms of Atrial Fibrillation within the next  6-9 months   On track   . Client will verbalize knowledge of diabetes self-management as evidenced by Hgb A1C <7 or as defined by provider.   On track    A1C 7.2 on 04/13/2019    . Maintain timely refills of Heart Failure medication as prescribed within the year    On track   . COMPLETED: Obtain annual  Lipid Profile, LDL-C   On track    Done 04/13/2019    . Visit Primary Care Provider or Cardiologist at least 2 times per year   On track   . Will report improvement in foot pain within the next 3-6 months.   On track      Plan: RNCM will continue care coordination with [facility]. RNCM will continue to follow as client's Chronic care management coordinator.   Thea Silversmith, RN, MSN, [place] [phone]

## 2019-06-26 NOTE — Telephone Encounter (Signed)
Spoke with the pts wife and she will further discuss with [name] future potential procedure plans at his 06/27/19 visit.

## 2019-06-26 NOTE — Progress Notes (Signed)
EPIC Encounter for ICM Monitoring  Patient Name: [name] is a 85 y.o. male Date: 06/26/2019 Primary Care Physican: [name], MD Primary [place] Electrophysiologist: [place] 06/26/2019 Weight: 187 lbs  Estimated remaining battery longevity29months.  Clinical Status (18-Jun-2019 to 26-Jun-2019)  AT/AF                 214 episodes  Time in AT/AF   20.0 hr/day (83.4%)  Longest AT/AF   19 hours  Observations (5) (18-Jun-2019 to 26-Jun-2019)  TriageHF (HF Risk) Alert: High (Ongoing)  AT/AF >= 6 hr for 8 days  Avg. Ventricular Rate >= 100 bpm during AT/AF (>= 6 hr) for 6 days  V. Pacing less than 90%  Patient Activity less than 1 hr/day for 1 weeks.     Spoke with wife.  She reports patient has not been feeling very well for the last month.  He has some shortness of breath and feeling weak.  He has virtual OV with Dr Irish Lack tomorrow and will discuss cardioversion.          OptivolThoracic impedancenormal.  Prescribed:Furosemide40 mg take1 tablet twice a day.  Labs: 06/07/2019 Creatinine 1.35, BUN 23, Potassium 4.5, Sodium 136, GFR 48-56 05/17/2019 Creatinine 1.19, BUN 15, Potassium 4.8, Sodium 141, GFR 56-65 05/09/2019 Creatinine 1.68, BUN 37, Potassium 4.9, Sodium 139, GFR 37-43 05/05/2019 Creatinine1.29, BUN26, Potassium4.2, Sodium140, [id number] Acomplete set of results can be found in Results Review.  Recommendations: No changes and encouraged to call if experiencing any fluid symptoms.  Follow-up plan: ICM clinic phone appointment on 08/06/2019.   Device clinic remote transmission battery check 07/19/2019.  Office appt 06/27/2019 with [name].    Copy of ICM check sent to Dr. Rayann Heman and Dr Irish Lack since patient has appt with him tomorrow.    3 month ICM trend: 06/25/2019    1 Year ICM trend:       Rosalene Billings, RN 06/26/2019 2:33 PM

## 2019-06-27 ENCOUNTER — Telehealth (INDEPENDENT_AMBULATORY_CARE_PROVIDER_SITE_OTHER): Payer: HMO | Admitting: Interventional Cardiology

## 2019-06-27 ENCOUNTER — Encounter: Payer: Self-pay | Admitting: Interventional Cardiology

## 2019-06-27 ENCOUNTER — Other Ambulatory Visit: Payer: Self-pay

## 2019-06-27 VITALS — BP 120/67 | HR 83 | Ht 67.0 in | Wt 188.0 lb

## 2019-06-27 DIAGNOSIS — I5022 Chronic systolic (congestive) heart failure: Secondary | ICD-10-CM | POA: Diagnosis not present

## 2019-06-27 DIAGNOSIS — I480 Paroxysmal atrial fibrillation: Secondary | ICD-10-CM

## 2019-06-27 DIAGNOSIS — I25118 Atherosclerotic heart disease of native coronary artery with other forms of angina pectoris: Secondary | ICD-10-CM

## 2019-06-27 DIAGNOSIS — E782 Mixed hyperlipidemia: Secondary | ICD-10-CM | POA: Diagnosis not present

## 2019-06-27 NOTE — Patient Instructions (Signed)
Medication Instructions:  Your physician recommends that you continue on your current medications as directed. Please refer to the Current Medication list given to you today.  If you need a refill on your cardiac medications before your next appointment, please call your pharmacy.   Lab work: None Ordered  If you have labs (blood work) drawn today and your tests are completely normal, you will receive your results only by: [name] MyChart Message (if you have MyChart) OR . A paper copy in the mail If you have any lab test that is abnormal or we need to change your treatment, we will call you to review the results.  Testing/Procedures: None ordered  Follow-Up: . [name] is going to discuss potential cardioversion with Dr. Rayann Heman. We will contact you later with the recommendations.  Any Other Special Instructions Will Be Listed Below (If Applicable).

## 2019-07-05 ENCOUNTER — Telehealth: Payer: Self-pay

## 2019-07-05 NOTE — Telephone Encounter (Signed)
-----   Message from Jettie Booze, MD sent at 06/29/2019  6:03 PM EDT ----- Let us plan for DC cardioversion for [name].  THanks.  JV ----- Message ----- From: Thompson Grayer, MD Sent: 06/27/2019   9:47 PM EDT To: Jettie Booze, MD  It does seem to be persistent.  We do not need to have a rep at cardioversion.  Ekg should be adequate to look at rhythm after cardioversion.      ----- Message ----- From: Jettie Booze, MD Sent: 06/26/2019   1:32 PM EDT To: Thompson Grayer, MD  Is the atrial tach persistent?  Will device rep need to be there for cardioversion to check success?

## 2019-07-05 NOTE — Telephone Encounter (Signed)
Called and spoke to patient's wife. Made her aware that [name] would like for patient to proceed with DCCV. Patient's last INR checks have been above goal. Per [name], no need for 4 consecutive weeks as long as patient is at least therapeutic on next check. DCCV scheduled for 9/17 at 2:30 PM with Dr. Debara Pickett. Reviewed below instructions with the patient's wife. Coumadin appt changed to [place] office on 9/14. Arranged for patient to have labs, EKG, and Covid test on 9/14 as well. Post DCCV f/u scheduled with [name] on 10/2.   You are scheduled for a Cardioversion on 07/12/19 with Dr. Debara Pickett.  Please arrive at the [facility] (Main Entrance A) at [facility]: [address] [place], [place] [address] at 1:30 PM (1 hour prior to procedure)  DIET: Nothing to eat or drink after midnight except a sip of water with medications (see medication instructions below)  Medication Instructions: Hold Insulin and furosemide (lasix) the morning of the procedure  Continue your anticoagulant: Coumadin You will need to continue your anticoagulant after your procedure until you are told by your Provider that it is safe to stop   Labs: CBC, BMET, INR on 9/14  Your Pre-procedure COVID-19 Testing will be done on 07/09/19 at 12:55 PM at [facility] at [id number], [place], [place] [address]. Once you arrive at the testing site, stay in the right hand lane, go under the building overhang not the tent. If you are tested under the tent your results may not be back before your procedure. Please be on time for your appointment.  After your swab you will be given a mask to wear and instructed to go home and quarantine/no visitors until after your procedure. If you test positive you will be notified and your procedure will be cancelled.   You must have a responsible person to drive you home and stay in the waiting area during your procedure. Failure to do so could result in  cancellation.  Bring your insurance cards.  *Special Note: Every effort is made to have your procedure done on time. Occasionally there are emergencies that occur at the hospital that may cause delays. Please be patient if a delay does occur.

## 2019-07-09 ENCOUNTER — Other Ambulatory Visit (HOSPITAL_COMMUNITY): Payer: HMO

## 2019-07-09 ENCOUNTER — Other Ambulatory Visit: Payer: Self-pay

## 2019-07-09 ENCOUNTER — Other Ambulatory Visit: Payer: HMO | Admitting: *Deleted

## 2019-07-09 ENCOUNTER — Ambulatory Visit (INDEPENDENT_AMBULATORY_CARE_PROVIDER_SITE_OTHER): Payer: HMO

## 2019-07-09 ENCOUNTER — Ambulatory Visit (INDEPENDENT_AMBULATORY_CARE_PROVIDER_SITE_OTHER): Payer: HMO | Admitting: *Deleted

## 2019-07-09 VITALS — BP 120/58 | HR 71 | Ht 66.0 in | Wt 190.0 lb

## 2019-07-09 DIAGNOSIS — Z5181 Encounter for therapeutic drug level monitoring: Secondary | ICD-10-CM

## 2019-07-09 DIAGNOSIS — I480 Paroxysmal atrial fibrillation: Secondary | ICD-10-CM

## 2019-07-09 DIAGNOSIS — I4819 Other persistent atrial fibrillation: Secondary | ICD-10-CM

## 2019-07-09 DIAGNOSIS — Z7901 Long term (current) use of anticoagulants: Secondary | ICD-10-CM | POA: Diagnosis not present

## 2019-07-09 LAB — POCT INR: INR: 3 (ref 2.0–3.0)

## 2019-07-09 NOTE — Progress Notes (Signed)
1.) Reason for visit: EKG for DCCV scheduled for 9/17  2.) Name of MD requesting visit: [facility]  3.) H&P: Afib/Atach  4.) ROS related to problem: SOB  5.) Assessment and plan per MD: Patient arrived to clinic for EKG prior to scheduled DCCV on 9/17. Patient's EKG showing Apaced at 71. Discussed with [name] who discussed with Dr. Rayann Heman who advised that the patient's device be interrogated. Per Device RN Cindy, patient Apacing with an underlying rhythm of SB with a rate of 45. Made [name] aware. We will cancel DCCV, labs, and COVID test and patient will f/u in 6 months or sooner if Sx change or worsen. Patient verbalized understanding. 6 month recall has been placed.

## 2019-07-09 NOTE — Patient Instructions (Signed)
Description   Continue taking 1 tablet everyday EXCEPT 1/2 TABLET ON MONDAYS & FRIDAYS. Recheck in 1 week post DCCV.  Amiodarone increased to 200mg  daily 05/05/2019.

## 2019-07-11 LAB — CUP PACEART INCLINIC DEVICE CHECK
Battery Remaining Longevity: 5 mo
Battery Voltage: 2.79 V
Brady Statistic AP VP Percent: 38.07 %
Brady Statistic AP VS Percent: 2.75 %
Brady Statistic AS VP Percent: 27.96 %
Brady Statistic AS VS Percent: 31.21 %
Brady Statistic RA Percent Paced: 39.18 %
Brady Statistic RV Percent Paced: 59.39 %
Date Time Interrogation Session: 20200914160900
HighPow Impedance: 52 Ohm
HighPow Impedance: 65 Ohm
Implantable Lead Implant Date: 20091215
Implantable Lead Implant Date: 20091215
Implantable Lead Implant Date: 20091215
Implantable Lead Location: 753858
Implantable Lead Location: 753859
Implantable Lead Location: 753860
Implantable Lead Model: 4196
Implantable Lead Model: 5076
Implantable Lead Model: 6947
Implantable Pulse Generator Implant Date: 20150113
Lead Channel Impedance Value: 4047 Ohm
Lead Channel Impedance Value: 4047 Ohm
Lead Channel Impedance Value: 456 Ohm
Lead Channel Impedance Value: 532 Ohm
Lead Channel Impedance Value: 551 Ohm
Lead Channel Impedance Value: 931 Ohm
Lead Channel Pacing Threshold Amplitude: 0.5 V
Lead Channel Pacing Threshold Amplitude: 0.75 V
Lead Channel Pacing Threshold Amplitude: 3.25 V
Lead Channel Pacing Threshold Pulse Width: 0.4 ms
Lead Channel Pacing Threshold Pulse Width: 0.6 ms
Lead Channel Pacing Threshold Pulse Width: 1 ms
Lead Channel Sensing Intrinsic Amplitude: 1.375 mV
Lead Channel Sensing Intrinsic Amplitude: 14.625 mV
Lead Channel Setting Pacing Amplitude: 1.5 V
Lead Channel Setting Pacing Amplitude: 1.75 V
Lead Channel Setting Pacing Amplitude: 4 V
Lead Channel Setting Pacing Pulse Width: 0.6 ms
Lead Channel Setting Pacing Pulse Width: 1 ms
Lead Channel Setting Sensing Sensitivity: 1.2 mV

## 2019-07-12 ENCOUNTER — Encounter (HOSPITAL_COMMUNITY): Payer: HMO

## 2019-07-12 ENCOUNTER — Ambulatory Visit (HOSPITAL_COMMUNITY): Admit: 2019-07-12 | Payer: HMO | Admitting: Internal Medicine

## 2019-07-12 SURGERY — CARDIOVERSION
Anesthesia: General

## 2019-07-13 ENCOUNTER — Other Ambulatory Visit: Payer: Self-pay

## 2019-07-13 ENCOUNTER — Encounter: Payer: Self-pay | Admitting: Internal Medicine

## 2019-07-13 ENCOUNTER — Ambulatory Visit (INDEPENDENT_AMBULATORY_CARE_PROVIDER_SITE_OTHER): Payer: HMO | Admitting: Internal Medicine

## 2019-07-13 VITALS — BP 112/64 | HR 67 | Ht 66.0 in | Wt 194.0 lb

## 2019-07-13 DIAGNOSIS — G4733 Obstructive sleep apnea (adult) (pediatric): Secondary | ICD-10-CM

## 2019-07-13 DIAGNOSIS — T82110S Breakdown (mechanical) of cardiac electrode, sequela: Secondary | ICD-10-CM

## 2019-07-13 DIAGNOSIS — I4819 Other persistent atrial fibrillation: Secondary | ICD-10-CM | POA: Diagnosis not present

## 2019-07-13 DIAGNOSIS — I2510 Atherosclerotic heart disease of native coronary artery without angina pectoris: Secondary | ICD-10-CM

## 2019-07-13 DIAGNOSIS — Z95810 Presence of automatic (implantable) cardiac defibrillator: Secondary | ICD-10-CM

## 2019-07-13 DIAGNOSIS — I5022 Chronic systolic (congestive) heart failure: Secondary | ICD-10-CM | POA: Diagnosis not present

## 2019-07-13 LAB — CUP PACEART INCLINIC DEVICE CHECK
Battery Remaining Longevity: 5 mo
Battery Voltage: 2.8 V
Brady Statistic AP VP Percent: 4.92 %
Brady Statistic AP VS Percent: 0.05 %
Brady Statistic AS VP Percent: 93.61 %
Brady Statistic AS VS Percent: 1.42 %
Brady Statistic RA Percent Paced: 4.96 %
Brady Statistic RV Percent Paced: 61.07 %
Date Time Interrogation Session: 20200918122029
HighPow Impedance: 51 Ohm
HighPow Impedance: 64 Ohm
Implantable Lead Implant Date: 20091215
Implantable Lead Implant Date: 20091215
Implantable Lead Implant Date: 20091215
Implantable Lead Location: 753858
Implantable Lead Location: 753859
Implantable Lead Location: 753860
Implantable Lead Model: 4196
Implantable Lead Model: 5076
Implantable Lead Model: 6947
Implantable Pulse Generator Implant Date: 20150113
Lead Channel Impedance Value: 4047 Ohm
Lead Channel Impedance Value: 4047 Ohm
Lead Channel Impedance Value: 456 Ohm
Lead Channel Impedance Value: 513 Ohm
Lead Channel Impedance Value: 551 Ohm
Lead Channel Impedance Value: 931 Ohm
Lead Channel Pacing Threshold Amplitude: 0.75 V
Lead Channel Pacing Threshold Amplitude: 1 V
Lead Channel Pacing Threshold Pulse Width: 0.4 ms
Lead Channel Pacing Threshold Pulse Width: 0.6 ms
Lead Channel Sensing Intrinsic Amplitude: 1.5 mV
Lead Channel Sensing Intrinsic Amplitude: 14.5 mV
Lead Channel Setting Pacing Amplitude: 0.5 V
Lead Channel Setting Pacing Amplitude: 1.25 V
Lead Channel Setting Pacing Amplitude: 1.75 V
Lead Channel Setting Pacing Pulse Width: 0.03 ms
Lead Channel Setting Pacing Pulse Width: 0.6 ms
Lead Channel Setting Sensing Sensitivity: 1.2 mV

## 2019-07-13 LAB — BASIC METABOLIC PANEL
BUN/Creatinine Ratio: 11 (ref 10–24)
BUN: 14 mg/dL (ref 8–27)
CO2: 25 mmol/L (ref 20–29)
Calcium: 9.3 mg/dL (ref 8.6–10.2)
Chloride: 103 mmol/L (ref 96–106)
Creatinine, Ser: 1.27 mg/dL (ref 0.76–1.27)
GFR calc Af Amer: 60 mL/min/{1.73_m2} (ref >59)
GFR calc non Af Amer: 52 mL/min/{1.73_m2} — ABNORMAL LOW (ref >59)
Glucose: 112 mg/dL — ABNORMAL HIGH (ref 65–99)
Potassium: 4.8 mmol/L (ref 3.5–5.2)
Sodium: 141 mmol/L (ref 134–144)

## 2019-07-13 LAB — CBC WITH DIFFERENTIAL/PLATELET
Basophils Absolute: 0.1 10*3/uL (ref 0.0–0.2)
Basos: 1 %
EOS (ABSOLUTE): 0.2 10*3/uL (ref 0.0–0.4)
Eos: 3 %
Hematocrit: 39.8 % (ref 37.5–51.0)
Hemoglobin: 13.7 g/dL (ref 13.0–17.7)
Immature Grans (Abs): 0 10*3/uL (ref 0.0–0.1)
Immature Granulocytes: 0 %
Lymphocytes Absolute: 1.4 10*3/uL (ref 0.7–3.1)
Lymphs: 19 %
MCH: 30.8 pg (ref 26.6–33.0)
MCHC: 34.4 g/dL (ref 31.5–35.7)
MCV: 89 fL (ref 79–97)
Monocytes Absolute: 0.5 10*3/uL (ref 0.1–0.9)
Monocytes: 7 %
Neutrophils Absolute: 5.3 10*3/uL (ref 1.4–7.0)
Neutrophils: 70 %
Platelets: 188 10*3/uL (ref 150–450)
RBC: 4.45 x10E6/uL (ref 4.14–5.80)
RDW: 13.2 % (ref 11.6–15.4)
WBC: 7.5 10*3/uL (ref 3.4–10.8)

## 2019-07-13 NOTE — Patient Instructions (Addendum)
Medication Instructions:  Your physician recommends that you continue on your current medications as directed. Please refer to the Current Medication list given to you today.  Labwork: You will get lab work today:  BMP and CBC  Testing/Procedures: None ordered.  Follow-Up:  Remote monitoring is used to monitor your ICD from home. This monitoring reduces the number of office visits required to check your device to one time per year. It allows [place] to keep an eye on the functioning of your device to ensure it is working properly. You are scheduled for a device check from home on 07/19/2019. You may send your transmission at any time that day. If you have a wireless device, the transmission will be sent automatically. After your physician reviews your transmission, you will receive a postcard with your next transmission date.  Any Other Special Instructions Will Be Listed Below (If Applicable).  If you need a refill on your cardiac medications before your next appointment, please call your pharmacy.

## 2019-07-13 NOTE — Progress Notes (Signed)
PCP: [name], MD Primary Cardiologist: Dr Irish Lack Primary EP: Dr Rayann Heman  [name] is a 85 y.o. male who presents today for routine electrophysiology followup.  Since last being seen in our clinic, the patient reports doing reasonably well. he has fatigue and SOB with moderate activity. Today, he denies symptoms of palpitations, chest pain,lower extremity edema, dizziness, presyncope, syncope, or ICD shocks.  The patient is otherwise without complaint today.   Past Medical History:  Diagnosis Date  . Atrial fibrillation ([place])    persistent, previously seen at [facility] and placed on amiodarone  . Benign prostatic hypertrophy   . CAD (coronary artery disease)    multivessel s/p inferolateral wall MI with subsequent CABG 11/1998.  Cath 2009 with Patent grafts  . Cleft palate   . COPD with emphysema ([place]) 04/01/2010  . DM (diabetes mellitus), type 2 ([place])   . Dyspnea   . GERD (gastroesophageal reflux disease)   . HTN (hypertension)   . Hyperlipidemia   . Hypothyroidism   . Iron deficiency anemia   . Ischemic dilated cardiomyopathy ([place])    EF 35-40% by MUGA 6/11  . Myocardial infarction ([place])   . Nasal septal deviation   . Nephrolithiasis   . OSA (obstructive sleep apnea)   . PAF (paroxysmal atrial fibrillation) ([place])   . Peripheral arterial disease (H[facility])    left subclavian artery stenosis  . PNA (pneumonia)   . Psoriasis   . Seborrheic keratosis   . Stroke ([place])   . Systolic congestive heart failure ([place]) 2009   s/p BiV ICD implantation by Dr Leonia Reeves (MDT)   Past Surgical History:  Procedure Laterality Date  . BI-VENTRICULAR IMPLANTABLE CARDIOVERTER DEFIBRILLATOR  (CRT-D)  10-08-08; 11-06-2013   Dr Leonia Reeves (MDT) implant for primary prevention; gen change to MDT VivaXT CRTD by Dr Rayann Heman  . BIV ICD GENERTAOR CHANGE OUT N/A 11/06/2013   Procedure: BIV ICD GENERTAOR CHANGE OUT;  Surgeon: Coralyn Mark, MD;  Location: [facility] CATH LAB;  Service: Cardiovascular;  Laterality:  N/A;  . c-spine surgery    . CARDIOVERSION N/A 04/29/2016   Procedure: CARDIOVERSION;  Surgeon: Pixie Casino, MD;  [facility] ENDOSCOPY;  Service: Cardiovascular;  Laterality: N/A;  . CARPAL TUNNEL RELEASE    . CATARACT EXTRACTION    . CORONARY ARTERY BYPASS GRAFT     LIMA to LAD, SVG to OM, SVG to diagonal  . ELECTROPHYSIOLOGIC STUDY N/A 06/11/2016   Procedure: Atrial Fibrillation Ablation;  Surgeon: Thompson Grayer, MD;  Location: [place] CV LAB;  Service: Cardiovascular;  Laterality: N/A;  . left cleft palate and left cleft lip repair      ROS- all systems are reviewed and negative except as per HPI above  Current Outpatient Medications  Medication Sig Dispense Refill  . acetaminophen (TYLENOL) 500 MG tablet Take 500 mg by mouth at bedtime.     [name] albuterol (PROAIR HFA) 108 (90 Base) MCG/ACT inhaler Inhale 2 puffs into the lungs every 6 (six) hours as needed for wheezing or shortness of breath.    [name] albuterol (PROVENTIL) (2.5 MG/3ML) 0.083% nebulizer solution USE 1 VIAL VIA NEBULIZER 3 TIMES A DAY AS DIRECTED. DX:J44.9 (Patient taking differently: Take 2.5 mg by nebulization See admin instructions. Nebulize 1 vial every morning and an additional two times daily as needed for shortness of breath or wheezing (DX: J44.9)) 300 mL 3  . amiodarone (PACERONE) 200 MG tablet Take 1 tablet (200 mg total) by mouth daily. 90 tablet 3  .  atorvastatin (LIPITOR) 80 MG tablet TAKE 1 TABLET (80 MG TOTAL) BY MOUTH DAILY. (Patient taking differently: Take 80 mg by mouth daily. ) 90 tablet 3  . carvedilol (COREG) 3.125 MG tablet Take 1 tablet (3.125 mg total) by mouth 2 (two) times daily with a meal. 180 tablet 3  . Cholecalciferol (VITAMIN D3) 50 MCG (2000 UT) capsule Take 2,000 Units by mouth every evening.    [name] dextromethorphan-guaiFENesin (MUCINEX DM) 30-600 MG 12hr tablet Take 1 tablet by mouth 2 (two) times daily as needed for cough.    . doxycycline (VIBRA-TABS) 100 MG tablet Take 1 tablet (100 mg  total) by mouth 2 (two) times daily. 14 tablet 0  . feeding supplement, ENSURE ENLIVE, (ENSURE ENLIVE) LIQD Take 237 mLs by mouth 2 (two) times daily between meals. 60 Bottle 0  . ferrous sulfate 325 (65 FE) MG tablet Take 325 mg by mouth every evening.     . fluticasone (FLONASE) 50 MCG/ACT nasal spray SPRAY 2 SPRAYS INTO EACH NOSTRIL EVERY DAY (Patient taking differently: Place 2 sprays into both nostrils every morning. ) 48 g 3  . furosemide (LASIX) 40 MG tablet Take 1 tablet (40 mg total) by mouth 2 (two) times daily. 120 tablet 1  . Insulin Degludec-Liraglutide (XULTOPHY) 100-3.6 UNIT-MG/ML SOPN Inject 14 Units into the skin every morning.     [name] levothyroxine (SYNTHROID, LEVOTHROID) 100 MCG tablet Take 100 mcg by mouth daily before breakfast.     . Multiple Vitamins-Minerals (PRESERVISION AREDS PO) Take 1 capsule by mouth 2 (two) times daily.     . potassium chloride 20 MEQ TBCR Take 20 mEq by mouth daily. 30 tablet 0  . PRESCRIPTION MEDICATION See admin instructions. CPAP- At bedtime    . senna (SENOKOT) 8.6 MG tablet Take 1 tablet by mouth 2 (two) times daily. HOLD FOR LOOSE STOOLS    . traMADol (ULTRAM) 50 MG tablet Take 50 mg by mouth at bedtime.   0  . TRELEGY ELLIPTA 100-62.5-25 MCG/INH AEPB INHALE 1 PUFF INTO LUNGS ONCE A DAY (Patient taking differently: Inhale 1 puff into the lungs daily. ) 60 each 3  . vitamin B-12 (CYANOCOBALAMIN) 1000 MCG tablet Take 1,000 mcg by mouth daily.    [name] warfarin (COUMADIN) 5 MG tablet Take as directed by [facility] (Patient taking differently: Take 5 mg by mouth daily after supper. ) 35 tablet 2   No current facility-administered medications for this visit.     Physical Exam: Vitals:   07/13/19 0836  BP: 112/64  Pulse: 67  SpO2: 94%  Weight: 194 lb (88 kg)  Height: 5\' 6"  (1.676 m)    GEN- The patient is well appearing, alert and oriented x 3 today.   Head- normocephalic, atraumatic Eyes-  Sclera clear, conjunctiva pink Ears- hearing  intact Oropharynx- clear Lungs- Clear to ausculation bilaterally, normal work of breathing Chest- ICD pocket is well healed Heart- Regular rate and rhythm, no murmurs, rubs or gallops, PMI not laterally displaced GI- soft, NT, ND, + BS Extremities- no clubbing, cyanosis, or edema  ICD interrogation- reviewed in detail today,  See PACEART report  ekg tracings are reviewed  Wt Readings from Last 3 Encounters:  07/13/19 194 lb (88 kg)  07/09/19 190 lb (86.2 kg)  06/27/19 188 lb (85.3 kg)    Assessment and Plan:  1.  Chronic systolic dysfunction euvolemic today Stable on an appropriate medical regimen Normal ICD function See Pace Art report No changes today He is near ERI.  His RV lead has failed.  He is symtomatic.  I would like to revise his ICD system to add a new RV lead.  We discussed adding a pacing lead vs ICD lead.  Pros and cons to CRT-P vs CRT-D were discussed at length.  Given his advanced age and co morbidities, he is clear that he would prefer pacing lead placement and CRT-P rather than CRT-D. Risks, benefits, and alternatives to the procedure were discussed in detail today.  The patient understands that risks include but are not limited to bleeding, infection, pneumothorax, perforation, tamponade, vascular damage, renal failure, MI, stroke, death,  damage to his existing leads, and lead dislodgement and wishes to proceed.  We will therefore schedule the procedure at the next available time.   Thompson Grayer MD, [facility] 07/13/2019 1:38 PM

## 2019-07-13 NOTE — H&P (View-Only) (Signed)
PCP: [name], MD Primary Cardiologist: Dr Irish Lack Primary EP: Dr Rayann Heman  [name] is a 85 y.o. male who presents today for routine electrophysiology followup.  Since last being seen in our clinic, the patient reports doing reasonably well. he has fatigue and SOB with moderate activity. Today, he denies symptoms of palpitations, chest pain,lower extremity edema, dizziness, presyncope, syncope, or ICD shocks.  The patient is otherwise without complaint today.   Past Medical History:  Diagnosis Date  . Atrial fibrillation ([place])    persistent, previously seen at [facility] and placed on amiodarone  . Benign prostatic hypertrophy   . CAD (coronary artery disease)    multivessel s/p inferolateral wall MI with subsequent CABG 11/1998.  Cath 2009 with Patent grafts  . Cleft palate   . COPD with emphysema ([place]) 04/01/2010  . DM (diabetes mellitus), type 2 ([place])   . Dyspnea   . GERD (gastroesophageal reflux disease)   . HTN (hypertension)   . Hyperlipidemia   . Hypothyroidism   . Iron deficiency anemia   . Ischemic dilated cardiomyopathy ([place])    EF 35-40% by MUGA 6/11  . Myocardial infarction ([place])   . Nasal septal deviation   . Nephrolithiasis   . OSA (obstructive sleep apnea)   . PAF (paroxysmal atrial fibrillation) ([place])   . Peripheral arterial disease (HCC)    left subclavian artery stenosis  . PNA (pneumonia)   . Psoriasis   . Seborrheic keratosis   . Stroke ([place])   . Systolic congestive heart failure ([place]) 2009   s/p BiV ICD implantation by Dr Leonia Reeves (MDT)   Past Surgical History:  Procedure Laterality Date  . BI-VENTRICULAR IMPLANTABLE CARDIOVERTER DEFIBRILLATOR  (CRT-D)  10-08-08; 11-06-2013   Dr Leonia Reeves (MDT) implant for primary prevention; gen change to MDT VivaXT CRTD by Dr Rayann Heman  . BIV ICD GENERTAOR CHANGE OUT N/A 11/06/2013   Procedure: BIV ICD GENERTAOR CHANGE OUT;  Surgeon: Coralyn Mark, MD;  Location: [facility] CATH LAB;  Service: Cardiovascular;  Laterality:  N/A;  . c-spine surgery    . CARDIOVERSION N/A 04/29/2016   Procedure: CARDIOVERSION;  Surgeon: Pixie Casino, MD;  Location: [facility] ENDOSCOPY;  Service: Cardiovascular;  Laterality: N/A;  . CARPAL TUNNEL RELEASE    . CATARACT EXTRACTION    . CORONARY ARTERY BYPASS GRAFT     LIMA to LAD, SVG to OM, SVG to diagonal  . ELECTROPHYSIOLOGIC STUDY N/A 06/11/2016   Procedure: Atrial Fibrillation Ablation;  Surgeon: Thompson Grayer, MD;  Location: [place] CV LAB;  Service: Cardiovascular;  Laterality: N/A;  . left cleft palate and left cleft lip repair      ROS- all systems are reviewed and negative except as per HPI above  Current Outpatient Medications  Medication Sig Dispense Refill  . acetaminophen (TYLENOL) 500 MG tablet Take 500 mg by mouth at bedtime.     [name] albuterol (PROAIR HFA) 108 (90 Base) MCG/ACT inhaler Inhale 2 puffs into the lungs every 6 (six) hours as needed for wheezing or shortness of breath.    [name] albuterol (PROVENTIL) (2.5 MG/3ML) 0.083% nebulizer solution USE 1 VIAL VIA NEBULIZER 3 TIMES A DAY AS DIRECTED. DX:J44.9 (Patient taking differently: Take 2.5 mg by nebulization See admin instructions. Nebulize 1 vial every morning and an additional two times daily as needed for shortness of breath or wheezing (DX: J44.9)) 300 mL 3  . amiodarone (PACERONE) 200 MG tablet Take 1 tablet (200 mg total) by mouth daily. 90 tablet 3  .  atorvastatin (LIPITOR) 80 MG tablet TAKE 1 TABLET (80 MG TOTAL) BY MOUTH DAILY. (Patient taking differently: Take 80 mg by mouth daily. ) 90 tablet 3  . carvedilol (COREG) 3.125 MG tablet Take 1 tablet (3.125 mg total) by mouth 2 (two) times daily with a meal. 180 tablet 3  . Cholecalciferol (VITAMIN D3) 50 MCG (2000 UT) capsule Take 2,000 Units by mouth every evening.    [name] dextromethorphan-guaiFENesin (MUCINEX DM) 30-600 MG 12hr tablet Take 1 tablet by mouth 2 (two) times daily as needed for cough.    . doxycycline (VIBRA-TABS) 100 MG tablet Take 1 tablet (100 mg  total) by mouth 2 (two) times daily. 14 tablet 0  . feeding supplement, ENSURE ENLIVE, (ENSURE ENLIVE) LIQD Take 237 mLs by mouth 2 (two) times daily between meals. 60 Bottle 0  . ferrous sulfate 325 (65 FE) MG tablet Take 325 mg by mouth every evening.     . fluticasone (FLONASE) 50 MCG/ACT nasal spray SPRAY 2 SPRAYS INTO EACH NOSTRIL EVERY DAY (Patient taking differently: Place 2 sprays into both nostrils every morning. ) 48 g 3  . furosemide (LASIX) 40 MG tablet Take 1 tablet (40 mg total) by mouth 2 (two) times daily. 120 tablet 1  . Insulin Degludec-Liraglutide (XULTOPHY) 100-3.6 UNIT-MG/ML SOPN Inject 14 Units into the skin every morning.     [name] levothyroxine (SYNTHROID, LEVOTHROID) 100 MCG tablet Take 100 mcg by mouth daily before breakfast.     . Multiple Vitamins-Minerals (PRESERVISION AREDS PO) Take 1 capsule by mouth 2 (two) times daily.     . potassium chloride 20 MEQ TBCR Take 20 mEq by mouth daily. 30 tablet 0  . PRESCRIPTION MEDICATION See admin instructions. CPAP- At bedtime    . senna (SENOKOT) 8.6 MG tablet Take 1 tablet by mouth 2 (two) times daily. HOLD FOR LOOSE STOOLS    . traMADol (ULTRAM) 50 MG tablet Take 50 mg by mouth at bedtime.   0  . TRELEGY ELLIPTA 100-62.5-25 MCG/INH AEPB INHALE 1 PUFF INTO LUNGS ONCE A DAY (Patient taking differently: Inhale 1 puff into the lungs daily. ) 60 each 3  . vitamin B-12 (CYANOCOBALAMIN) 1000 MCG tablet Take 1,000 mcg by mouth daily.    [name] warfarin (COUMADIN) 5 MG tablet Take as directed by [facility] (Patient taking differently: Take 5 mg by mouth daily after supper. ) 35 tablet 2   No current facility-administered medications for this visit.     Physical Exam: Vitals:   07/13/19 0836  BP: 112/64  Pulse: 67  SpO2: 94%  Weight: 194 lb (88 kg)  Height: 5\' 6"  (1.676 m)    GEN- The patient is well appearing, alert and oriented x 3 today.   Head- normocephalic, atraumatic Eyes-  Sclera clear, conjunctiva pink Ears- hearing  intact Oropharynx- clear Lungs- Clear to ausculation bilaterally, normal work of breathing Chest- ICD pocket is well healed Heart- Regular rate and rhythm, no murmurs, rubs or gallops, PMI not laterally displaced GI- soft, NT, ND, + BS Extremities- no clubbing, cyanosis, or edema  ICD interrogation- reviewed in detail today,  See PACEART report  ekg tracings are reviewed  Wt Readings from Last 3 Encounters:  07/13/19 194 lb (88 kg)  07/09/19 190 lb (86.2 kg)  06/27/19 188 lb (85.3 kg)    Assessment and Plan:  1.  Chronic systolic dysfunction euvolemic today Stable on an appropriate medical regimen Normal ICD function See Pace Art report No changes today He is near ERI.  His RV lead has failed.  He is symtomatic.  I would like to revise his ICD system to add a new RV lead.  We discussed adding a pacing lead vs ICD lead.  Pros and cons to CRT-P vs CRT-D were discussed at length.  Given his advanced age and co morbidities, he is clear that he would prefer pacing lead placement and CRT-P rather than CRT-D. Risks, benefits, and alternatives to the procedure were discussed in detail today.  The patient understands that risks include but are not limited to bleeding, infection, pneumothorax, perforation, tamponade, vascular damage, renal failure, MI, stroke, death,  damage to his existing leads, and lead dislodgement and wishes to proceed.  We will therefore schedule the procedure at the next available time.   Thompson Grayer MD, [facility] 07/13/2019 1:38 PM

## 2019-07-18 ENCOUNTER — Ambulatory Visit (INDEPENDENT_AMBULATORY_CARE_PROVIDER_SITE_OTHER): Payer: HMO

## 2019-07-18 ENCOUNTER — Other Ambulatory Visit: Payer: Self-pay

## 2019-07-18 DIAGNOSIS — Z5181 Encounter for therapeutic drug level monitoring: Secondary | ICD-10-CM

## 2019-07-18 DIAGNOSIS — Z7901 Long term (current) use of anticoagulants: Secondary | ICD-10-CM | POA: Diagnosis not present

## 2019-07-18 DIAGNOSIS — I480 Paroxysmal atrial fibrillation: Secondary | ICD-10-CM | POA: Diagnosis not present

## 2019-07-18 DIAGNOSIS — I4819 Other persistent atrial fibrillation: Secondary | ICD-10-CM

## 2019-07-18 LAB — POCT INR: INR: 3.4 — AB (ref 2.0–3.0)

## 2019-07-18 NOTE — Patient Instructions (Signed)
Until I hear back from Dr. Rayann Heman regarding his official instructions, I'm going to give you the instructions that he told you regarding holding warfarin prior to your procedure next week.   Please take 1/2 tablet tonight, then continue taking 1 tablet everyday EXCEPT 1/2 [place]. Per Dr. Rayann Heman, please hold warfarin on Sun, 9/27 & Mon 9/28. The night of your procedure, if ok'd by MD, please resume warfarin w/ extra 1/2 tablet for 2 days.   *If Dr. Rayann Heman decides that you need Lovenox bridge, I will call you and get you in for another appt to go over those instructions.

## 2019-07-19 ENCOUNTER — Ambulatory Visit (INDEPENDENT_AMBULATORY_CARE_PROVIDER_SITE_OTHER): Payer: HMO | Admitting: *Deleted

## 2019-07-19 DIAGNOSIS — Z95810 Presence of automatic (implantable) cardiac defibrillator: Secondary | ICD-10-CM

## 2019-07-19 LAB — CUP PACEART REMOTE DEVICE CHECK
Battery Remaining Longevity: 4 mo
Battery Voltage: 2.8 V
Brady Statistic AP VP Percent: 0.03 %
Brady Statistic AP VS Percent: 0.01 %
Brady Statistic AS VP Percent: 99.43 %
Brady Statistic AS VS Percent: 0.53 %
Brady Statistic RA Percent Paced: 0.04 %
Brady Statistic RV Percent Paced: 97.49 %
Date Time Interrogation Session: 20200924041806
HighPow Impedance: 52 Ohm
HighPow Impedance: 67 Ohm
Implantable Lead Implant Date: 20091215
Implantable Lead Implant Date: 20091215
Implantable Lead Implant Date: 20091215
Implantable Lead Location: 753858
Implantable Lead Location: 753859
Implantable Lead Location: 753860
Implantable Lead Model: 4196
Implantable Lead Model: 5076
Implantable Lead Model: 6947
Implantable Pulse Generator Implant Date: 20150113
Lead Channel Impedance Value: 4047 Ohm
Lead Channel Impedance Value: 4047 Ohm
Lead Channel Impedance Value: 456 Ohm
Lead Channel Impedance Value: 532 Ohm
Lead Channel Impedance Value: 551 Ohm
Lead Channel Impedance Value: 950 Ohm
Lead Channel Pacing Threshold Amplitude: 0.25 V
Lead Channel Pacing Threshold Amplitude: 0.875 V
Lead Channel Pacing Threshold Amplitude: 0.875 V
Lead Channel Pacing Threshold Pulse Width: 0.4 ms
Lead Channel Pacing Threshold Pulse Width: 0.4 ms
Lead Channel Pacing Threshold Pulse Width: 0.6 ms
Lead Channel Sensing Intrinsic Amplitude: 1 mV
Lead Channel Sensing Intrinsic Amplitude: 12.375 mV
Lead Channel Setting Pacing Amplitude: 0.5 V
Lead Channel Setting Pacing Amplitude: 1.5 V
Lead Channel Setting Pacing Amplitude: 1.75 V
Lead Channel Setting Pacing Pulse Width: 0.03 ms
Lead Channel Setting Pacing Pulse Width: 0.6 ms
Lead Channel Setting Sensing Sensitivity: 1.2 mV

## 2019-07-20 ENCOUNTER — Other Ambulatory Visit (HOSPITAL_COMMUNITY)
Admission: RE | Admit: 2019-07-20 | Discharge: 2019-07-20 | Disposition: A | Discharge status: Home / Self Care | Payer: HMO | Source: Ambulatory Visit | Attending: Internal Medicine | Admitting: Internal Medicine

## 2019-07-20 DIAGNOSIS — Z20828 Contact with and (suspected) exposure to other viral communicable diseases: Secondary | ICD-10-CM | POA: Diagnosis not present

## 2019-07-20 DIAGNOSIS — Z01812 Encounter for preprocedural laboratory examination: Secondary | ICD-10-CM | POA: Diagnosis not present

## 2019-07-21 LAB — NOVEL CORONAVIRUS, NAA (HOSP ORDER, SEND-OUT TO REF LAB; TAT 18-24 HRS): SARS-CoV-2, NAA: NOT DETECTED

## 2019-07-24 ENCOUNTER — Other Ambulatory Visit: Payer: Self-pay

## 2019-07-24 ENCOUNTER — Encounter (HOSPITAL_COMMUNITY)
Admission: RE | Disposition: A | Discharge status: Home / Self Care | Payer: Self-pay | Source: Home / Self Care | Attending: Internal Medicine

## 2019-07-24 ENCOUNTER — Encounter (HOSPITAL_COMMUNITY): Payer: Self-pay | Admitting: *Deleted

## 2019-07-24 ENCOUNTER — Ambulatory Visit (HOSPITAL_COMMUNITY)
Admission: RE | Admit: 2019-07-24 | Discharge: 2019-07-25 | Disposition: A | Discharge status: Home / Self Care | Payer: HMO | Attending: Internal Medicine | Admitting: Internal Medicine

## 2019-07-24 DIAGNOSIS — I2510 Atherosclerotic heart disease of native coronary artery without angina pectoris: Secondary | ICD-10-CM | POA: Diagnosis not present

## 2019-07-24 DIAGNOSIS — D509 Iron deficiency anemia, unspecified: Secondary | ICD-10-CM | POA: Diagnosis not present

## 2019-07-24 DIAGNOSIS — J449 Chronic obstructive pulmonary disease, unspecified: Secondary | ICD-10-CM | POA: Diagnosis not present

## 2019-07-24 DIAGNOSIS — Z959 Presence of cardiac and vascular implant and graft, unspecified: Secondary | ICD-10-CM

## 2019-07-24 DIAGNOSIS — Z79899 Other long term (current) drug therapy: Secondary | ICD-10-CM | POA: Insufficient documentation

## 2019-07-24 DIAGNOSIS — Z794 Long term (current) use of insulin: Secondary | ICD-10-CM | POA: Insufficient documentation

## 2019-07-24 DIAGNOSIS — I5022 Chronic systolic (congestive) heart failure: Secondary | ICD-10-CM | POA: Diagnosis not present

## 2019-07-24 DIAGNOSIS — K219 Gastro-esophageal reflux disease without esophagitis: Secondary | ICD-10-CM | POA: Insufficient documentation

## 2019-07-24 DIAGNOSIS — Z79890 Hormone replacement therapy (postmenopausal): Secondary | ICD-10-CM | POA: Insufficient documentation

## 2019-07-24 DIAGNOSIS — E1151 Type 2 diabetes mellitus with diabetic peripheral angiopathy without gangrene: Secondary | ICD-10-CM | POA: Insufficient documentation

## 2019-07-24 DIAGNOSIS — Z4502 Encounter for adjustment and management of automatic implantable cardiac defibrillator: Secondary | ICD-10-CM | POA: Diagnosis not present

## 2019-07-24 DIAGNOSIS — G4733 Obstructive sleep apnea (adult) (pediatric): Secondary | ICD-10-CM | POA: Insufficient documentation

## 2019-07-24 DIAGNOSIS — Z7901 Long term (current) use of anticoagulants: Secondary | ICD-10-CM | POA: Diagnosis not present

## 2019-07-24 DIAGNOSIS — I4819 Other persistent atrial fibrillation: Secondary | ICD-10-CM | POA: Insufficient documentation

## 2019-07-24 DIAGNOSIS — T82110A Breakdown (mechanical) of cardiac electrode, initial encounter: Secondary | ICD-10-CM | POA: Diagnosis not present

## 2019-07-24 DIAGNOSIS — I420 Dilated cardiomyopathy: Secondary | ICD-10-CM | POA: Diagnosis not present

## 2019-07-24 DIAGNOSIS — I110 Hypertensive heart disease with heart failure: Secondary | ICD-10-CM | POA: Insufficient documentation

## 2019-07-24 DIAGNOSIS — Z8673 Personal history of transient ischemic attack (TIA), and cerebral infarction without residual deficits: Secondary | ICD-10-CM | POA: Diagnosis not present

## 2019-07-24 DIAGNOSIS — I255 Ischemic cardiomyopathy: Secondary | ICD-10-CM | POA: Diagnosis not present

## 2019-07-24 DIAGNOSIS — I447 Left bundle-branch block, unspecified: Secondary | ICD-10-CM | POA: Diagnosis present

## 2019-07-24 DIAGNOSIS — I252 Old myocardial infarction: Secondary | ICD-10-CM | POA: Insufficient documentation

## 2019-07-24 DIAGNOSIS — E039 Hypothyroidism, unspecified: Secondary | ICD-10-CM | POA: Diagnosis not present

## 2019-07-24 DIAGNOSIS — E785 Hyperlipidemia, unspecified: Secondary | ICD-10-CM | POA: Diagnosis not present

## 2019-07-24 HISTORY — PX: BIV PACEMAKER GENERATOR CHANGEOUT: EP1198

## 2019-07-24 HISTORY — PX: LEAD REVISION/REPAIR: EP1213

## 2019-07-24 LAB — HEMOGLOBIN A1C
Hgb A1c MFr Bld: 7.2 % — ABNORMAL HIGH (ref 4.8–5.6)
Mean Plasma Glucose: 159.94 mg/dL

## 2019-07-24 LAB — GLUCOSE, CAPILLARY
Glucose-Capillary: 81 mg/dL (ref 70–99)
Glucose-Capillary: 93 mg/dL (ref 70–99)
Glucose-Capillary: 106 mg/dL — ABNORMAL HIGH (ref 70–99)

## 2019-07-24 LAB — SURGICAL PCR SCREEN
MRSA, PCR: NEGATIVE
Staphylococcus aureus: NEGATIVE

## 2019-07-24 LAB — PROTIME-INR
INR: 1.6 — ABNORMAL HIGH (ref 0.8–1.2)
Prothrombin Time: 19.1 seconds — ABNORMAL HIGH (ref 11.4–15.2)

## 2019-07-24 SURGERY — BIV PACEMAKER GENERATOR CHANGEOUT

## 2019-07-24 MED ORDER — TRAMADOL HCL 50 MG PO TABS: 50.0000 mg | ORAL_TABLET | Freq: Two times a day (BID) | ORAL | Status: DC | PRN

## 2019-07-24 MED ORDER — POTASSIUM CHLORIDE CRYS ER 20 MEQ PO TBCR
20.0000 meq | EXTENDED_RELEASE_TABLET | Freq: Every day | ORAL | Status: DC
  Administered 2019-07-25: 20 meq via ORAL
  Filled 2019-07-24: qty 1

## 2019-07-24 MED ORDER — HEPARIN (PORCINE) IN NACL 1000-0.9 UT/500ML-% IV SOLN
INTRAVENOUS | Status: AC
  Filled 2019-07-24: qty 500

## 2019-07-24 MED ORDER — SODIUM CHLORIDE 0.9 % IV SOLN
INTRAVENOUS | Status: AC
  Filled 2019-07-24: qty 2

## 2019-07-24 MED ORDER — TRAMADOL HCL 50 MG PO TABS
50.0000 mg | ORAL_TABLET | Freq: Every day | ORAL | Status: DC
  Administered 2019-07-24: 50 mg via ORAL
  Filled 2019-07-24: qty 1

## 2019-07-24 MED ORDER — LEVOTHYROXINE SODIUM 100 MCG PO TABS
100.0000 ug | ORAL_TABLET | Freq: Every day | ORAL | Status: DC
  Administered 2019-07-25: 100 ug via ORAL
  Filled 2019-07-24: qty 1

## 2019-07-24 MED ORDER — MIDAZOLAM HCL 5 MG/5ML IJ SOLN
INTRAMUSCULAR | Status: DC | PRN
  Administered 2019-07-24: 1 mg via INTRAVENOUS

## 2019-07-24 MED ORDER — HYDROCODONE-ACETAMINOPHEN 5-325 MG PO TABS
1.0000 | ORAL_TABLET | ORAL | Status: DC | PRN
  Administered 2019-07-24: 2 via ORAL
  Filled 2019-07-24: qty 2

## 2019-07-24 MED ORDER — HEPARIN (PORCINE) IN NACL 1000-0.9 UT/500ML-% IV SOLN
INTRAVENOUS | Status: DC | PRN
  Administered 2019-07-24: 500 mL

## 2019-07-24 MED ORDER — AMIODARONE HCL 200 MG PO TABS
200.0000 mg | ORAL_TABLET | Freq: Every day | ORAL | Status: DC
  Administered 2019-07-25: 200 mg via ORAL
  Filled 2019-07-24: qty 1

## 2019-07-24 MED ORDER — TRAMADOL HCL 50 MG PO TABS: 50.0000 mg | ORAL_TABLET | ORAL | Status: DC

## 2019-07-24 MED ORDER — LIDOCAINE HCL (PF) 1 % IJ SOLN
INTRAMUSCULAR | Status: AC
  Filled 2019-07-24: qty 60

## 2019-07-24 MED ORDER — YOU HAVE A PACEMAKER BOOK
Freq: Once | Status: AC
  Administered 2019-07-25: 05:00:00
  Filled 2019-07-24: qty 1

## 2019-07-24 MED ORDER — FENTANYL CITRATE (PF) 100 MCG/2ML IJ SOLN
INTRAMUSCULAR | Status: DC | PRN
  Administered 2019-07-24 (×2): 12.5 ug via INTRAVENOUS
  Administered 2019-07-24: 25 ug via INTRAVENOUS

## 2019-07-24 MED ORDER — ACETAMINOPHEN 325 MG PO TABS: 325.0000 mg | ORAL_TABLET | ORAL | Status: DC | PRN

## 2019-07-24 MED ORDER — MIDAZOLAM HCL 5 MG/5ML IJ SOLN
INTRAMUSCULAR | Status: AC
  Filled 2019-07-24: qty 5

## 2019-07-24 MED ORDER — CEFAZOLIN SODIUM-DEXTROSE 1-4 GM/50ML-% IV SOLN
1.0000 g | Freq: Four times a day (QID) | INTRAVENOUS | Status: AC
  Administered 2019-07-24 – 2019-07-25 (×3): 1 g via INTRAVENOUS
  Filled 2019-07-24 (×3): qty 50

## 2019-07-24 MED ORDER — ALBUTEROL SULFATE (2.5 MG/3ML) 0.083% IN NEBU: 2.5000 mg | INHALATION_SOLUTION | Freq: Four times a day (QID) | RESPIRATORY_TRACT | Status: DC | PRN

## 2019-07-24 MED ORDER — CHLORHEXIDINE GLUCONATE 4 % EX LIQD
60.0000 mL | Freq: Once | CUTANEOUS | Status: DC
  Filled 2019-07-24: qty 60

## 2019-07-24 MED ORDER — INSULIN ASPART 100 UNIT/ML ~~LOC~~ SOLN: 0.0000 [IU] | Freq: Three times a day (TID) | SUBCUTANEOUS | Status: DC

## 2019-07-24 MED ORDER — FENTANYL CITRATE (PF) 100 MCG/2ML IJ SOLN
INTRAMUSCULAR | Status: AC
  Filled 2019-07-24: qty 2

## 2019-07-24 MED ORDER — ONDANSETRON HCL 4 MG/2ML IJ SOLN: 4.0000 mg | Freq: Four times a day (QID) | INTRAMUSCULAR | Status: DC | PRN

## 2019-07-24 MED ORDER — LIDOCAINE HCL (PF) 1 % IJ SOLN
INTRAMUSCULAR | Status: DC | PRN
  Administered 2019-07-24: 40 mL

## 2019-07-24 MED ORDER — CEFAZOLIN SODIUM-DEXTROSE 2-4 GM/100ML-% IV SOLN
2.0000 g | INTRAVENOUS | Status: AC
  Administered 2019-07-24: 2 g via INTRAVENOUS

## 2019-07-24 MED ORDER — FUROSEMIDE 40 MG PO TABS
40.0000 mg | ORAL_TABLET | Freq: Two times a day (BID) | ORAL | Status: DC
  Administered 2019-07-25: 40 mg via ORAL
  Filled 2019-07-24: qty 1

## 2019-07-24 MED ORDER — SODIUM CHLORIDE 0.9 % IV SOLN: 250.0000 mL | INTRAVENOUS | Status: DC | PRN

## 2019-07-24 MED ORDER — CARVEDILOL 3.125 MG PO TABS
3.1250 mg | ORAL_TABLET | Freq: Two times a day (BID) | ORAL | Status: DC
  Administered 2019-07-25: 3.125 mg via ORAL
  Filled 2019-07-24: qty 1

## 2019-07-24 MED ORDER — SODIUM CHLORIDE 0.9% FLUSH: 3.0000 mL | INTRAVENOUS | Status: DC | PRN

## 2019-07-24 MED ORDER — CEFAZOLIN SODIUM-DEXTROSE 2-4 GM/100ML-% IV SOLN
INTRAVENOUS | Status: AC
  Filled 2019-07-24: qty 100

## 2019-07-24 MED ORDER — SODIUM CHLORIDE 0.9 % IV SOLN
80.0000 mg | INTRAVENOUS | Status: AC
  Administered 2019-07-24: 80 mg

## 2019-07-24 MED ORDER — SODIUM CHLORIDE 0.9 % IV SOLN
INTRAVENOUS | Status: DC
  Administered 2019-07-24: 12:00:00 via INTRAVENOUS

## 2019-07-24 MED ORDER — MUPIROCIN 2 % EX OINT
TOPICAL_OINTMENT | CUTANEOUS | Status: AC
  Administered 2019-07-24: 1
  Filled 2019-07-24: qty 22

## 2019-07-24 MED ORDER — SODIUM CHLORIDE 0.9% FLUSH
3.0000 mL | Freq: Two times a day (BID) | INTRAVENOUS | Status: DC
  Administered 2019-07-24 – 2019-07-25 (×2): 3 mL via INTRAVENOUS

## 2019-07-24 MED ORDER — INSULIN GLARGINE 100 UNIT/ML ~~LOC~~ SOLN
10.0000 [IU] | Freq: Every day | SUBCUTANEOUS | Status: DC
  Administered 2019-07-25: 10 [IU] via SUBCUTANEOUS
  Filled 2019-07-24: qty 0.1

## 2019-07-24 SURGICAL SUPPLY — 14 items
CABLE SURGICAL S-101-97-12 (CABLE) ×2 IMPLANT
CATH RIGHTSITE C315HIS02 (CATHETERS) ×2 IMPLANT
KIT MICROPUNCTURE NIT STIFF (SHEATH) ×4 IMPLANT
LEAD CAPSURE NOVUS 5076-58CM (Lead) ×2 IMPLANT
LEAD SELECT SECURE 3830 383069 (Lead) ×1 IMPLANT
PACEMAKER PRCT MRI CRTP W1TR01 (Pacemaker) ×1 IMPLANT
PAD PRO RADIOLUCENT 2001M-C (PAD) ×2 IMPLANT
PPM PRECEPTA MRI CRT-P W1TR01 (Pacemaker) ×2 IMPLANT
SELECT SECURE 3830 383069 (Lead) ×2 IMPLANT
SHEATH 7FR PRELUDE SNAP 13 (SHEATH) ×2 IMPLANT
SHEATH CLASSIC 7F 25CM (SHEATH) ×2 IMPLANT
SHEATH PINNACLE 6F 10CM (SHEATH) ×2 IMPLANT
TRAY PACEMAKER INSERTION (PACKS) ×2 IMPLANT
WIRE HI TORQ VERSACORE-J 145CM (WIRE) ×2 IMPLANT

## 2019-07-24 NOTE — Progress Notes (Signed)
Patient had red area to left chest (looked like abrasion).  When patient was asked about it he stated he did it in his workshop.

## 2019-07-24 NOTE — Interval H&P Note (Signed)
History and Physical Interval Note:  07/24/2019 12:36 PM  [place]  has presented today for surgery, with the diagnosis of ERI, Lead failure.  The various methods of treatment have been discussed with the patient and family. After consideration of risks, benefits and other options for treatment, the patient has consented to  Procedure(s): BIV PACEMAKER GENERATOR CHANGEOUT (N/A) LEAD REVISION/REPAIR (N/A) with downgrade to biventricular pacemaker as a surgical intervention.  The patient's history has been reviewed, patient examined, no change in status, stable for surgery.  I have reviewed the patient's chart and labs.  Questions were answered to the patient's satisfaction.    The patient and I have had a long discussion today.  Given his advanced age, he is clear that he would prefer CRT-P rather than CRT-D.  He is aware that his new device will not have shock therapies.  This is consistent with prior decisions as his tachycardia detections have chronically been off due to RV lead failure.  Thompson Grayer

## 2019-07-24 NOTE — Discharge Instructions (Signed)
° ° °  Supplemental Discharge Instructions for  Pacemaker/Defibrillator Patients  Activity No heavy lifting or vigorous activity with your left/right arm for 6 to 8 weeks.  Do not raise your left/right arm above your head for one week.  Gradually raise your affected arm as drawn below.              07/28/2019                07/29/2019                07/30/2019               07/31/2019 __  NO DRIVING for 1 week  ; you may begin driving on  [id number]   .  WOUND CARE - Keep the wound area clean and dry.  Do not get this area wet, no showers until cleared to at your wound check visit . - The tape/steri-strips on your wound will fall off; do not pull them off.  No bandage is needed on the site.  DO  NOT apply any creams, oils, or ointments to the wound area. - If you notice any drainage or discharge from the wound, any swelling or bruising at the site, or you develop a fever > 101? F after you are discharged home, call the office at once.  Special Instructions - You are still able to use cellular telephones; use the ear opposite the side where you have your pacemaker/defibrillator.  Avoid carrying your cellular phone near your device. - When traveling through airports, show security personnel your identification card to avoid being screened in the metal detectors.  Ask the security personnel to use the hand wand. - Avoid arc welding equipment, MRI testing (magnetic resonance imaging), TENS units (transcutaneous nerve stimulators).  Call the office for questions about other devices. - Avoid electrical appliances that are in poor condition or are not properly grounded. - Microwave ovens are safe to be near or to operate.

## 2019-07-25 ENCOUNTER — Ambulatory Visit (HOSPITAL_COMMUNITY): Payer: HMO

## 2019-07-25 ENCOUNTER — Other Ambulatory Visit: Payer: Self-pay

## 2019-07-25 ENCOUNTER — Encounter (HOSPITAL_COMMUNITY): Payer: Self-pay | Admitting: Internal Medicine

## 2019-07-25 DIAGNOSIS — I5023 Acute on chronic systolic (congestive) heart failure: Secondary | ICD-10-CM | POA: Diagnosis not present

## 2019-07-25 DIAGNOSIS — I4819 Other persistent atrial fibrillation: Secondary | ICD-10-CM | POA: Diagnosis not present

## 2019-07-25 DIAGNOSIS — E1122 Type 2 diabetes mellitus with diabetic chronic kidney disease: Secondary | ICD-10-CM | POA: Diagnosis not present

## 2019-07-25 DIAGNOSIS — J984 Other disorders of lung: Secondary | ICD-10-CM | POA: Diagnosis not present

## 2019-07-25 DIAGNOSIS — J449 Chronic obstructive pulmonary disease, unspecified: Secondary | ICD-10-CM | POA: Diagnosis not present

## 2019-07-25 DIAGNOSIS — G4733 Obstructive sleep apnea (adult) (pediatric): Secondary | ICD-10-CM | POA: Diagnosis not present

## 2019-07-25 DIAGNOSIS — I447 Left bundle-branch block, unspecified: Secondary | ICD-10-CM | POA: Diagnosis not present

## 2019-07-25 DIAGNOSIS — I25118 Atherosclerotic heart disease of native coronary artery with other forms of angina pectoris: Secondary | ICD-10-CM | POA: Diagnosis not present

## 2019-07-25 DIAGNOSIS — Z4502 Encounter for adjustment and management of automatic implantable cardiac defibrillator: Secondary | ICD-10-CM | POA: Diagnosis not present

## 2019-07-25 DIAGNOSIS — D509 Iron deficiency anemia, unspecified: Secondary | ICD-10-CM | POA: Diagnosis not present

## 2019-07-25 DIAGNOSIS — E109 Type 1 diabetes mellitus without complications: Secondary | ICD-10-CM | POA: Diagnosis not present

## 2019-07-25 DIAGNOSIS — K219 Gastro-esophageal reflux disease without esophagitis: Secondary | ICD-10-CM | POA: Diagnosis not present

## 2019-07-25 DIAGNOSIS — I517 Cardiomegaly: Secondary | ICD-10-CM | POA: Diagnosis not present

## 2019-07-25 DIAGNOSIS — E039 Hypothyroidism, unspecified: Secondary | ICD-10-CM | POA: Diagnosis not present

## 2019-07-25 DIAGNOSIS — I420 Dilated cardiomyopathy: Secondary | ICD-10-CM | POA: Diagnosis not present

## 2019-07-25 DIAGNOSIS — I255 Ischemic cardiomyopathy: Secondary | ICD-10-CM | POA: Diagnosis not present

## 2019-07-25 DIAGNOSIS — E1151 Type 2 diabetes mellitus with diabetic peripheral angiopathy without gangrene: Secondary | ICD-10-CM | POA: Diagnosis not present

## 2019-07-25 DIAGNOSIS — R918 Other nonspecific abnormal finding of lung field: Secondary | ICD-10-CM | POA: Diagnosis not present

## 2019-07-25 DIAGNOSIS — I2510 Atherosclerotic heart disease of native coronary artery without angina pectoris: Secondary | ICD-10-CM | POA: Diagnosis not present

## 2019-07-25 DIAGNOSIS — I129 Hypertensive chronic kidney disease with stage 1 through stage 4 chronic kidney disease, or unspecified chronic kidney disease: Secondary | ICD-10-CM | POA: Diagnosis not present

## 2019-07-25 DIAGNOSIS — E1142 Type 2 diabetes mellitus with diabetic polyneuropathy: Secondary | ICD-10-CM | POA: Diagnosis not present

## 2019-07-25 DIAGNOSIS — I480 Paroxysmal atrial fibrillation: Secondary | ICD-10-CM | POA: Diagnosis not present

## 2019-07-25 DIAGNOSIS — E785 Hyperlipidemia, unspecified: Secondary | ICD-10-CM | POA: Diagnosis not present

## 2019-07-25 DIAGNOSIS — N400 Enlarged prostate without lower urinary tract symptoms: Secondary | ICD-10-CM | POA: Diagnosis not present

## 2019-07-25 DIAGNOSIS — N183 Chronic kidney disease, stage 3 (moderate): Secondary | ICD-10-CM | POA: Diagnosis not present

## 2019-07-25 DIAGNOSIS — I252 Old myocardial infarction: Secondary | ICD-10-CM | POA: Diagnosis not present

## 2019-07-25 LAB — BASIC METABOLIC PANEL
Anion gap: 10 (ref 5–15)
BUN: 15 mg/dL (ref 8–23)
CO2: 27 mmol/L (ref 22–32)
Calcium: 9 mg/dL (ref 8.9–10.3)
Chloride: 103 mmol/L (ref 98–111)
Creatinine, Ser: 1.2 mg/dL (ref 0.61–1.24)
GFR calc Af Amer: >60 mL/min (ref >60)
GFR calc non Af Amer: 56 mL/min — ABNORMAL LOW (ref >60)
Glucose, Bld: 102 mg/dL — ABNORMAL HIGH (ref 70–99)
Potassium: 4.3 mmol/L (ref 3.5–5.1)
Sodium: 140 mmol/L (ref 135–145)

## 2019-07-25 LAB — GLUCOSE, CAPILLARY: Glucose-Capillary: 100 mg/dL — ABNORMAL HIGH (ref 70–99)

## 2019-07-25 LAB — PROTIME-INR
INR: 1.5 — ABNORMAL HIGH (ref 0.8–1.2)
Prothrombin Time: 18 seconds — ABNORMAL HIGH (ref 11.4–15.2)

## 2019-07-25 NOTE — Progress Notes (Signed)
Remote ICD transmission.   

## 2019-07-25 NOTE — Patient Outreach (Addendum)
  [facility]) Care Management Chronic Special Needs Program    07/25/2019  Name: [name], DOB: 1934-09-14  MRN: [id number]   Mr. Eliano Srader is enrolled in a chronic special needs plan. Client presented 07/24/2019 for procedure: pacemaker generator change.  Care plan sent to utilization management team.  Thea Silversmith, RN, MSN, [place] [phone]  Addendum: client is active with [facility] for complex community care management. RNCM will continue care coordination with [facility] as needed. RNCM will continue to follow as client's C-SNP Chronic care management coordinator.

## 2019-07-25 NOTE — Discharge Summary (Addendum)
ELECTROPHYSIOLOGY PROCEDURE DISCHARGE SUMMARY    Patient ID: [name],  MRN: [id number], DOB/AGE: [birth year] 85 y.o.  Admit date: 07/24/2019 Discharge date: 07/25/2019  Primary Care Physician: [name], MD  Primary Cardiologist: [name] Electrophysiologist: Dr. Rayann Heman  Primary Discharge Diagnosis:  1. RV lead failure  Secondary Discharge Diagnosis/PMHx:  1. CAD 2. Chronic CHF (systolic) 3. ICM 4. Persistent AFib     CHA2DS2Vasc is 8, on warfarin, monitored and managed by the coumadin clinic 5. HTN 6. HLD 7. DM  Allergies  Allergen Reactions   Adhesive [Tape] Other (See Comments)    PATIENT'S SKIN TEARS AND BRUISES VERY EASILY- IS TAKING COUMADIN!!   Aldactone [Spironolactone] Other (See Comments)    Hyperkalemia  reported by Dr. [name] - pt is currently taking 12.5 mg daily -November 2017 per medication list by same MD   Losartan Other (See Comments)    "Landed the patient in the hospital"- AFFECTED KIDNEY FUNCTION     Procedures This Admission:  1.  Implantation of a new RV (pacing) lead and new Left bundle pacing lead lead, downgrade of CRT-D to CRT-P on 07/24/2019 by Dr Rayann Heman.  The patient received a Medtronic model 5076- 58 (serial number [id number]) right ventricular lead, Medtronic model 3830-69 [id number]) lead, in the left bundle position, Medtronic Percepta CRT-P MRI SureScan model [id number] (serial number [id number]) biventricular pacemaker There were no immediate post procedure complications. See op report for full procedure detail 2.  CXR on 07/25/2019 demonstrated no pneumothorax status post device implantation.   Brief HPI: [name] is a 85 y.o. male is followed outpatient by Dr. Rayann Heman with a CRT-D in place.  He was noted to have RV lead failure and battery nearing ERI.  Discussion with the patient regarding options and was decided to proceed with downgrade of his ICD to CRT-P, and new RV lead.  Risks, benefits, and  alternatives to the planned procedure were reviewed with the patient who wished to proceed.   Hospital Course:  The patient was admitted and underwent procedure as noted above, CS lead was noted to be broken intra-op and elected to replace with Left bundle position RV lead along with new RV apex lead, full details in op report.  He was monitored on telemetry overnight which demonstrated SR, V pacing.  Left chest was without hematoma or ecchymosis.  The device was interrogated and found to be functioning normally.  CXR was obtained and demonstrated no pneumothorax status post device implantation.  Wound care, arm mobility, and restrictions were reviewed with the patient.  The patient feels well this AM, no CP or SOB.  Pulmonary exam is negative, no symptoms of fluid OL.  Patient is encouraged OOB,  continue his home lasix. He was examined by Dr. Rayann Heman and considered stable for discharge to home.    Physical Exam: Vitals:   07/24/19 1725 07/24/19 2159 07/25/19 0505 07/25/19 0841  BP: (!) 155/79 139/68 (!) 148/84 116/71  Pulse:  81  71  Resp: 20 18    Temp:  98 F (36.7 C) 98 F (36.7 C)   TempSrc:  Oral Oral   SpO2:  94% 94%   Weight:   84.8 kg   Height:        GEN- The patient is well appearing, alert and oriented x 3 today.   HEENT: normocephalic, atraumatic; sclera clear, conjunctiva pink; hearing intact; oropharynx clear; neck supple, no JVP Lungs- CTA b/l, normal work of breathing.  No  wheezes, rales, rhonchi Heart- RRR, no murmurs, rubs or gallops, PMI not laterally displaced GI- soft, non-tender, non-distended Extremities- no clubbing, cyanosis, or edema MS- no significant deformity or atrophy Skin- warm and dry, no rash or lesion, left chest without hematoma, mild ecchymosis surrounding site Psych- euthymic mood, full affect Neuro- no gross deficits   Labs:   Lab Results  Component Value Date   WBC 7.5 07/13/2019   HGB 13.7 07/13/2019   HCT 39.8 07/13/2019   MCV 89  07/13/2019   PLT 188 07/13/2019    Recent Labs  Lab 07/25/19 0344  NA 140  K 4.3  CL 103  CO2 27  BUN 15  CREATININE 1.20  CALCIUM 9.0  GLUCOSE 102*    Discharge Medications:  Allergies as of 07/25/2019      Reactions   Adhesive [tape] Other (See Comments)   PATIENT'S SKIN TEARS AND BRUISES VERY EASILY- IS TAKING COUMADIN!!   Aldactone [spironolactone] Other (See Comments)   Hyperkalemia  reported by Dr. [name] - pt is currently taking 12.5 mg daily -November 2017 per medication list by same MD   Losartan Other (See Comments)   "Landed the patient in the hospital"- AFFECTED KIDNEY FUNCTION      Medication List    TAKE these medications   acetaminophen 500 MG tablet Commonly known as: TYLENOL Take 1,000 mg by mouth at bedtime.   ProAir HFA 108 (90 Base) MCG/ACT inhaler Generic drug: albuterol Inhale 2 puffs into the lungs every 6 (six) hours as needed for wheezing or shortness of breath. What changed: Another medication with the same name was changed. Make sure you understand how and when to take each.   albuterol (2.5 MG/3ML) 0.083% nebulizer solution Commonly known as: PROVENTIL USE 1 VIAL VIA NEBULIZER 3 TIMES A DAY AS DIRECTED. DX:J44.9 What changed: See the new instructions.   amiodarone 200 MG tablet Commonly known as: PACERONE Take 1 tablet (200 mg total) by mouth daily.   atorvastatin 80 MG tablet Commonly known as: LIPITOR TAKE 1 TABLET (80 MG TOTAL) BY MOUTH DAILY. What changed: See the new instructions.   bisacodyl 5 MG EC tablet Commonly known as: DULCOLAX Take 10 mg by mouth daily as needed (constipation).   carvedilol 3.125 MG tablet Commonly known as: COREG Take 1 tablet (3.125 mg total) by mouth 2 (two) times daily with a meal.   dextromethorphan-guaiFENesin 30-600 MG 12hr tablet Commonly known as: MUCINEX DM Take 1 tablet by mouth 2 (two) times daily as needed for cough.   doxycycline 100 MG tablet Commonly known as:  VIBRA-TABS Take 1 tablet (100 mg total) by mouth 2 (two) times daily. Notes to patient: This appears to be a completed medication, please confirm with the prescribing doctor regarding this medicine   feeding supplement (ENSURE ENLIVE) Liqd Take 237 mLs by mouth 2 (two) times daily between meals.   ferrous sulfate 325 (65 FE) MG tablet Take 325 mg by mouth every evening.   fluticasone 50 MCG/ACT nasal spray Commonly known as: FLONASE SPRAY 2 SPRAYS INTO EACH NOSTRIL EVERY DAY What changed: See the new instructions.   furosemide 40 MG tablet Commonly known as: LASIX Take 1 tablet (40 mg total) by mouth 2 (two) times daily.   levothyroxine 100 MCG tablet Commonly known as: SYNTHROID Take 100 mcg by mouth daily before breakfast.   Potassium Chloride ER 20 MEQ Tbcr Take 20 mEq by mouth daily.   PRESCRIPTION MEDICATION See admin instructions. CPAP- At bedtime  PRESERVISION AREDS PO Take 1 capsule by mouth 2 (two) times daily.   Senokot 8.6 MG tablet Generic drug: senna Take 1 tablet by mouth 2 (two) times daily. HOLD FOR LOOSE STOOLS   traMADol 50 MG tablet Commonly known as: ULTRAM Take 50 mg by mouth See admin instructions. Take 1 tablet (50 mg) by mouth scheduled at bedtime, may take an additional 2 doses if needed for pain.   Trelegy Ellipta 100-62.5-25 MCG/INH Aepb Generic drug: Fluticasone-Umeclidin-Vilant INHALE 1 PUFF INTO LUNGS ONCE A DAY What changed: See the new instructions.   vitamin B-12 1000 MCG tablet Commonly known as: CYANOCOBALAMIN Take 1,000 mcg by mouth daily.   Vitamin D3 50 MCG (2000 UT) capsule Take 2,000 Units by mouth every evening.   warfarin 5 MG tablet Commonly known as: COUMADIN Take as directed. If you are unsure how to take this medication, talk to your nurse or doctor. Original instructions: Take as directed by Coumadin Clinic What changed:   how much to take  how to take this  when to take this  additional  instructions Notes to patient: Resume your usual dosing schedule Friday 07/27/2019 and follow up with the coumadin clinic as scheduled next week   Xultophy 100-3.6 UNIT-MG/ML Sopn Generic drug: Insulin Degludec-Liraglutide Inject 14 Units into the skin daily.       Disposition:  Home  Discharge Instructions    Diet - low sodium heart healthy   Complete by: As directed    Increase activity slowly   Complete by: As directed      Follow-up Information    [facility] Follow up.   Specialty: Cardiology Why: 08/09/2019 @ 4:00PM, wound check visit Contact information: [address] [place]       Thompson Grayer, MD Follow up.   Specialty: Cardiology Why: You will be called by Dr. Jackalyn Lombard scheduler to make a 3 month follow up visit Contact information: [place] [address] [phone]        [facility] Follow up.   Specialty: Cardiology Why: 07/30/2019 @ 12:00PM (noon), coumadin clinic Contact information: [address] [place] [phone]          Duration of Discharge Encounter: Greater than 30 minutes including physician time.  Signed, Tommye Standard, PA-C 07/25/2019 9:10 AM   I have seen, examined the patient, and reviewed the above assessment and plan.  Changes to above are made where necessary.  On exam, RRR.  Doing well s/p pacing system revision.  Device interrogation is reviewed and normal  DC to home with routine wound care and follow-up. Resume coumadin later this week.  Co Sign: Thompson Grayer, MD 07/25/2019 4:19 PM

## 2019-07-27 ENCOUNTER — Encounter (HOSPITAL_COMMUNITY): Payer: Self-pay | Admitting: Internal Medicine

## 2019-07-27 ENCOUNTER — Ambulatory Visit: Payer: HMO | Admitting: Interventional Cardiology

## 2019-07-30 ENCOUNTER — Other Ambulatory Visit: Payer: Self-pay

## 2019-07-30 ENCOUNTER — Other Ambulatory Visit: Payer: Self-pay | Admitting: Interventional Cardiology

## 2019-07-30 ENCOUNTER — Ambulatory Visit (INDEPENDENT_AMBULATORY_CARE_PROVIDER_SITE_OTHER): Payer: HMO

## 2019-07-30 DIAGNOSIS — Z5181 Encounter for therapeutic drug level monitoring: Secondary | ICD-10-CM

## 2019-07-30 DIAGNOSIS — I480 Paroxysmal atrial fibrillation: Secondary | ICD-10-CM | POA: Diagnosis not present

## 2019-07-30 DIAGNOSIS — Z7901 Long term (current) use of anticoagulants: Secondary | ICD-10-CM | POA: Diagnosis not present

## 2019-07-30 DIAGNOSIS — I4819 Other persistent atrial fibrillation: Secondary | ICD-10-CM

## 2019-07-30 LAB — POCT INR: INR: 1.3 — AB (ref 2.0–3.0)

## 2019-07-30 NOTE — Patient Outreach (Signed)
  [facility]) Care Management Chronic Special Needs Program    07/30/2019  Name: [name], DOB: 07-Mar-1934  MRN: [id number]   [name] is enrolled in a chronic special needs plan. Client hospitalized 9/29-9/30 for pacemaker generator change. Client is active with [facility] for complex community care management. [facility] to follow up.  Plan: continue care coordination with [facility]. RNCM will continue to follow as client's C-SNP chronic care management coordinator.   Thea Silversmith, RN, MSN, [place] [phone] .

## 2019-07-30 NOTE — Patient Instructions (Signed)
Please take 1.5 tablets tonight, then resume taking 1 tablet everyday EXCEPT 1/2 [place].   Recheck INR in 2 weeks.

## 2019-08-01 ENCOUNTER — Other Ambulatory Visit: Payer: Self-pay

## 2019-08-01 NOTE — Patient Outreach (Signed)
  [facility]) Care Management Chronic Special Needs Program   08/01/2019  Name: [name], DOB: Oct 16, 1934  MRN: [id number]  The client was discussed in today's interdisciplinary care team meeting.  The following issues were discussed:  Client's needs, Changes in health status, Care Plan, Coordination of care and Care transitions  Participants present:                     Thea Silversmith, MSN, RN, CCM                     Melissa Sandlin RN,BSN,CCM, CDE  Kelli Churn, RN, CCM, CDE Quinn Plowman RN, BSN, CCM            Marco Collie, MD Maryella Shivers, MD            Gilda Crease, PharmD, RPh Bary Castilla, RN, BSN, MS, CCM Coralie Carpen, MD [facility]:  Babs Bertin, RN; Teresa Pelton, RN, BSN  Plan: Client is actively engaged with [facility] for complex community care management coordination. RNCM will continue care coordination with [facility]; RNCM will continue to follow as client's C-SNP Chronic care management coordinator.   Thea Silversmith, RN, MSN, [place] [phone]

## 2019-08-06 ENCOUNTER — Other Ambulatory Visit: Payer: Self-pay

## 2019-08-07 ENCOUNTER — Ambulatory Visit: Payer: HMO

## 2019-08-09 ENCOUNTER — Other Ambulatory Visit: Payer: Self-pay

## 2019-08-09 ENCOUNTER — Ambulatory Visit (INDEPENDENT_AMBULATORY_CARE_PROVIDER_SITE_OTHER): Payer: HMO | Admitting: Student

## 2019-08-09 DIAGNOSIS — I5022 Chronic systolic (congestive) heart failure: Secondary | ICD-10-CM | POA: Diagnosis not present

## 2019-08-09 LAB — CUP PACEART INCLINIC DEVICE CHECK
Date Time Interrogation Session: 20201015163429
Implantable Lead Implant Date: 20091215
Implantable Lead Implant Date: 20091215
Implantable Lead Implant Date: 20091215
Implantable Lead Location: 753858
Implantable Lead Location: 753859
Implantable Lead Location: 753860
Implantable Lead Model: 4196
Implantable Lead Model: 5076
Implantable Lead Model: 6947
Implantable Pulse Generator Implant Date: 20150113

## 2019-08-09 NOTE — Progress Notes (Signed)
Wound check appointment. Steri-strips removed. Wound without redness or edema. Incision edges approximated, wound well healed. Normal device function. RV lead programmed low to not pace. LV lead his in LBB position. Thresholds, sensing, and impedances consistent with implant measurements. RA and LV programmed for auto capture. Histogram distribution appropriate for patient and level of activity. No mode switches or high ventricular rates noted. Patient educated about wound care, arm mobility, lifting restrictions. ROV in 3 months with Dr. Rayann Heman. (To be scheduled pending finalization of January schedules)

## 2019-08-13 ENCOUNTER — Other Ambulatory Visit: Payer: Self-pay

## 2019-08-13 ENCOUNTER — Ambulatory Visit (INDEPENDENT_AMBULATORY_CARE_PROVIDER_SITE_OTHER): Payer: HMO

## 2019-08-13 DIAGNOSIS — I4819 Other persistent atrial fibrillation: Secondary | ICD-10-CM | POA: Diagnosis not present

## 2019-08-13 DIAGNOSIS — Z5181 Encounter for therapeutic drug level monitoring: Secondary | ICD-10-CM

## 2019-08-13 DIAGNOSIS — Z7901 Long term (current) use of anticoagulants: Secondary | ICD-10-CM

## 2019-08-13 DIAGNOSIS — I480 Paroxysmal atrial fibrillation: Secondary | ICD-10-CM | POA: Diagnosis not present

## 2019-08-13 LAB — POCT INR: INR: 4.4 — AB (ref 2.0–3.0)

## 2019-08-13 NOTE — Patient Instructions (Signed)
Please skip warfarin tonight, take 1/2 tablets tonight, then resume taking 1 tablet everyday EXCEPT 1/2 [place].   Recheck INR in 2 weeks.

## 2019-08-14 DIAGNOSIS — I5022 Chronic systolic (congestive) heart failure: Secondary | ICD-10-CM | POA: Diagnosis not present

## 2019-08-14 DIAGNOSIS — Z23 Encounter for immunization: Secondary | ICD-10-CM | POA: Diagnosis not present

## 2019-08-14 DIAGNOSIS — Z794 Long term (current) use of insulin: Secondary | ICD-10-CM | POA: Diagnosis not present

## 2019-08-14 DIAGNOSIS — E1151 Type 2 diabetes mellitus with diabetic peripheral angiopathy without gangrene: Secondary | ICD-10-CM | POA: Diagnosis not present

## 2019-08-14 DIAGNOSIS — Z7901 Long term (current) use of anticoagulants: Secondary | ICD-10-CM | POA: Diagnosis not present

## 2019-08-14 DIAGNOSIS — N183 Chronic kidney disease, stage 3 unspecified: Secondary | ICD-10-CM | POA: Diagnosis not present

## 2019-08-14 DIAGNOSIS — I129 Hypertensive chronic kidney disease with stage 1 through stage 4 chronic kidney disease, or unspecified chronic kidney disease: Secondary | ICD-10-CM | POA: Diagnosis not present

## 2019-08-14 DIAGNOSIS — E1122 Type 2 diabetes mellitus with diabetic chronic kidney disease: Secondary | ICD-10-CM | POA: Diagnosis not present

## 2019-08-14 DIAGNOSIS — J449 Chronic obstructive pulmonary disease, unspecified: Secondary | ICD-10-CM | POA: Diagnosis not present

## 2019-08-14 DIAGNOSIS — E109 Type 1 diabetes mellitus without complications: Secondary | ICD-10-CM | POA: Diagnosis not present

## 2019-08-22 DIAGNOSIS — E109 Type 1 diabetes mellitus without complications: Secondary | ICD-10-CM | POA: Diagnosis not present

## 2019-08-22 DIAGNOSIS — N400 Enlarged prostate without lower urinary tract symptoms: Secondary | ICD-10-CM | POA: Diagnosis not present

## 2019-08-22 DIAGNOSIS — E1151 Type 2 diabetes mellitus with diabetic peripheral angiopathy without gangrene: Secondary | ICD-10-CM | POA: Diagnosis not present

## 2019-08-22 DIAGNOSIS — I5023 Acute on chronic systolic (congestive) heart failure: Secondary | ICD-10-CM | POA: Diagnosis not present

## 2019-08-22 DIAGNOSIS — E1142 Type 2 diabetes mellitus with diabetic polyneuropathy: Secondary | ICD-10-CM | POA: Diagnosis not present

## 2019-08-22 DIAGNOSIS — J449 Chronic obstructive pulmonary disease, unspecified: Secondary | ICD-10-CM | POA: Diagnosis not present

## 2019-08-22 DIAGNOSIS — I5022 Chronic systolic (congestive) heart failure: Secondary | ICD-10-CM | POA: Diagnosis not present

## 2019-08-22 DIAGNOSIS — Z794 Long term (current) use of insulin: Secondary | ICD-10-CM | POA: Diagnosis not present

## 2019-08-22 DIAGNOSIS — I25118 Atherosclerotic heart disease of native coronary artery with other forms of angina pectoris: Secondary | ICD-10-CM | POA: Diagnosis not present

## 2019-08-22 DIAGNOSIS — E1122 Type 2 diabetes mellitus with diabetic chronic kidney disease: Secondary | ICD-10-CM | POA: Diagnosis not present

## 2019-08-22 DIAGNOSIS — E039 Hypothyroidism, unspecified: Secondary | ICD-10-CM | POA: Diagnosis not present

## 2019-08-22 DIAGNOSIS — I129 Hypertensive chronic kidney disease with stage 1 through stage 4 chronic kidney disease, or unspecified chronic kidney disease: Secondary | ICD-10-CM | POA: Diagnosis not present

## 2019-08-27 ENCOUNTER — Other Ambulatory Visit: Payer: Self-pay

## 2019-08-27 ENCOUNTER — Ambulatory Visit (INDEPENDENT_AMBULATORY_CARE_PROVIDER_SITE_OTHER): Payer: HMO

## 2019-08-27 DIAGNOSIS — Z5181 Encounter for therapeutic drug level monitoring: Secondary | ICD-10-CM

## 2019-08-27 DIAGNOSIS — I4819 Other persistent atrial fibrillation: Secondary | ICD-10-CM

## 2019-08-27 DIAGNOSIS — Z7901 Long term (current) use of anticoagulants: Secondary | ICD-10-CM

## 2019-08-27 DIAGNOSIS — I480 Paroxysmal atrial fibrillation: Secondary | ICD-10-CM | POA: Diagnosis not present

## 2019-08-27 LAB — POCT INR: INR: 2.8 (ref 2.0–3.0)

## 2019-08-27 NOTE — Patient Instructions (Signed)
Please continue taking 1 tablet everyday EXCEPT 1/2 TABLET ON [place].   Recheck INR in 3 weeks.

## 2019-09-11 ENCOUNTER — Ambulatory Visit (INDEPENDENT_AMBULATORY_CARE_PROVIDER_SITE_OTHER): Payer: HMO

## 2019-09-11 DIAGNOSIS — Z95810 Presence of automatic (implantable) cardiac defibrillator: Secondary | ICD-10-CM

## 2019-09-11 DIAGNOSIS — I5022 Chronic systolic (congestive) heart failure: Secondary | ICD-10-CM

## 2019-09-12 DIAGNOSIS — D492 Neoplasm of unspecified behavior of bone, soft tissue, and skin: Secondary | ICD-10-CM | POA: Diagnosis not present

## 2019-09-12 DIAGNOSIS — H353132 Nonexudative age-related macular degeneration, bilateral, intermediate dry stage: Secondary | ICD-10-CM | POA: Diagnosis not present

## 2019-09-12 DIAGNOSIS — E119 Type 2 diabetes mellitus without complications: Secondary | ICD-10-CM | POA: Diagnosis not present

## 2019-09-12 DIAGNOSIS — H26491 Other secondary cataract, right eye: Secondary | ICD-10-CM | POA: Diagnosis not present

## 2019-09-12 DIAGNOSIS — Z961 Presence of intraocular lens: Secondary | ICD-10-CM | POA: Diagnosis not present

## 2019-09-12 DIAGNOSIS — H43811 Vitreous degeneration, right eye: Secondary | ICD-10-CM | POA: Diagnosis not present

## 2019-09-14 NOTE — Progress Notes (Signed)
EPIC Encounter for ICM Monitoring  Patient Name: [name] is a 85 y.o. male Date: 09/14/2019 Primary Care Physican: [name], MD Primary [place] Electrophysiologist: Allred BiV PPM: 99.5% 09/14/2019 Weight: 185 lbs   Spoke with wife and pt is asymptomatic for fluid accumulation.          OptivolThoracic impedancenormal.  Prescribed:Furosemide40 mg take1 tablettwice a day.  Labs: 07/25/2019 Creatinine 1.20, BUN 15, Potassium 4.3, Sodium 140, GFR 56->60 07/13/2019 Creatinine 1.27, BUN 14, Potassium 4.8, Sodium 141, GFR 52-60  06/07/2019 Creatinine 1.35, BUN 23, Potassium 4.5, Sodium 136, GFR 48-56 05/17/2019 Creatinine 1.19, BUN 15, Potassium 4.8, Sodium 141, GFR 56-65 05/09/2019 Creatinine 1.68, BUN 37, Potassium 4.9, Sodium 139, GFR 37-43 05/05/2019 Creatinine1.29, BUN26, Potassium4.2, Sodium140, [id number] Acomplete set of results can be found in Results Review.  Recommendations: No changes and encouraged to call if experiencing any fluid symptoms.  Follow-up plan: ICM clinic phone appointment on 10/22/2019.  Office appt 10/29/2019 with Dr. Rayann Heman.    Copy of ICM check sent to Dr. Rayann Heman.   3 month ICM trend: 09/11/2019    1 Year ICM trend:       Rosalene Billings, RN 09/14/2019 9:57 AM

## 2019-09-17 ENCOUNTER — Ambulatory Visit (INDEPENDENT_AMBULATORY_CARE_PROVIDER_SITE_OTHER): Payer: HMO

## 2019-09-17 ENCOUNTER — Other Ambulatory Visit: Payer: Self-pay

## 2019-09-17 DIAGNOSIS — I4819 Other persistent atrial fibrillation: Secondary | ICD-10-CM | POA: Diagnosis not present

## 2019-09-17 DIAGNOSIS — I480 Paroxysmal atrial fibrillation: Secondary | ICD-10-CM

## 2019-09-17 DIAGNOSIS — Z7901 Long term (current) use of anticoagulants: Secondary | ICD-10-CM

## 2019-09-17 DIAGNOSIS — Z5181 Encounter for therapeutic drug level monitoring: Secondary | ICD-10-CM

## 2019-09-17 LAB — POCT INR: INR: 2.8 (ref 2.0–3.0)

## 2019-09-17 NOTE — Patient Instructions (Signed)
Please continue taking 1 tablet everyday EXCEPT 1/2 TABLET ON [place].   Recheck INR in 4 weeks.   Please let your dentist know that you are on warfarin - if you are only having 1 tooth extracted, there is no need to hold warfarin.  If dentist feels that you need to hold warfarin prior to dental procedure, please have him contact Dr. Rayann Heman in the [place] office.

## 2019-09-19 DIAGNOSIS — I5023 Acute on chronic systolic (congestive) heart failure: Secondary | ICD-10-CM | POA: Diagnosis not present

## 2019-09-19 DIAGNOSIS — I129 Hypertensive chronic kidney disease with stage 1 through stage 4 chronic kidney disease, or unspecified chronic kidney disease: Secondary | ICD-10-CM | POA: Diagnosis not present

## 2019-09-19 DIAGNOSIS — E1122 Type 2 diabetes mellitus with diabetic chronic kidney disease: Secondary | ICD-10-CM | POA: Diagnosis not present

## 2019-09-19 DIAGNOSIS — J449 Chronic obstructive pulmonary disease, unspecified: Secondary | ICD-10-CM | POA: Diagnosis not present

## 2019-09-19 DIAGNOSIS — N183 Chronic kidney disease, stage 3 unspecified: Secondary | ICD-10-CM | POA: Diagnosis not present

## 2019-09-19 DIAGNOSIS — E1151 Type 2 diabetes mellitus with diabetic peripheral angiopathy without gangrene: Secondary | ICD-10-CM | POA: Diagnosis not present

## 2019-09-19 DIAGNOSIS — E109 Type 1 diabetes mellitus without complications: Secondary | ICD-10-CM | POA: Diagnosis not present

## 2019-09-19 DIAGNOSIS — E039 Hypothyroidism, unspecified: Secondary | ICD-10-CM | POA: Diagnosis not present

## 2019-09-19 DIAGNOSIS — I5022 Chronic systolic (congestive) heart failure: Secondary | ICD-10-CM | POA: Diagnosis not present

## 2019-09-19 DIAGNOSIS — E1142 Type 2 diabetes mellitus with diabetic polyneuropathy: Secondary | ICD-10-CM | POA: Diagnosis not present

## 2019-09-19 DIAGNOSIS — I25118 Atherosclerotic heart disease of native coronary artery with other forms of angina pectoris: Secondary | ICD-10-CM | POA: Diagnosis not present

## 2019-10-01 DIAGNOSIS — E119 Type 2 diabetes mellitus without complications: Secondary | ICD-10-CM | POA: Diagnosis not present

## 2019-10-01 DIAGNOSIS — H353132 Nonexudative age-related macular degeneration, bilateral, intermediate dry stage: Secondary | ICD-10-CM | POA: Diagnosis not present

## 2019-10-01 DIAGNOSIS — Z961 Presence of intraocular lens: Secondary | ICD-10-CM | POA: Diagnosis not present

## 2019-10-01 DIAGNOSIS — D492 Neoplasm of unspecified behavior of bone, soft tissue, and skin: Secondary | ICD-10-CM | POA: Diagnosis not present

## 2019-10-01 DIAGNOSIS — H43811 Vitreous degeneration, right eye: Secondary | ICD-10-CM | POA: Diagnosis not present

## 2019-10-01 DIAGNOSIS — H26491 Other secondary cataract, right eye: Secondary | ICD-10-CM | POA: Diagnosis not present

## 2019-10-03 ENCOUNTER — Other Ambulatory Visit: Payer: Self-pay

## 2019-10-03 ENCOUNTER — Ambulatory Visit (INDEPENDENT_AMBULATORY_CARE_PROVIDER_SITE_OTHER): Payer: HMO

## 2019-10-03 DIAGNOSIS — Z7901 Long term (current) use of anticoagulants: Secondary | ICD-10-CM | POA: Diagnosis not present

## 2019-10-03 DIAGNOSIS — I4819 Other persistent atrial fibrillation: Secondary | ICD-10-CM | POA: Diagnosis not present

## 2019-10-03 DIAGNOSIS — I480 Paroxysmal atrial fibrillation: Secondary | ICD-10-CM

## 2019-10-03 DIAGNOSIS — Z5181 Encounter for therapeutic drug level monitoring: Secondary | ICD-10-CM

## 2019-10-03 LAB — POCT INR: INR: 2.4 (ref 2.0–3.0)

## 2019-10-03 NOTE — Patient Instructions (Signed)
Please continue taking 1 tablet everyday EXCEPT 1/2 TABLET ON [place].   Recheck INR in 5 weeks.   Please call if dentist wants to you hold your warfarin - I'll get you back in sooner.

## 2019-10-12 DIAGNOSIS — L821 Other seborrheic keratosis: Secondary | ICD-10-CM | POA: Diagnosis not present

## 2019-10-12 DIAGNOSIS — L820 Inflamed seborrheic keratosis: Secondary | ICD-10-CM | POA: Diagnosis not present

## 2019-10-12 DIAGNOSIS — D692 Other nonthrombocytopenic purpura: Secondary | ICD-10-CM | POA: Diagnosis not present

## 2019-10-12 DIAGNOSIS — C441122 Basal cell carcinoma of skin of right lower eyelid, including canthus: Secondary | ICD-10-CM | POA: Diagnosis not present

## 2019-10-12 DIAGNOSIS — D485 Neoplasm of uncertain behavior of skin: Secondary | ICD-10-CM | POA: Diagnosis not present

## 2019-10-12 DIAGNOSIS — C44229 Squamous cell carcinoma of skin of left ear and external auricular canal: Secondary | ICD-10-CM | POA: Diagnosis not present

## 2019-10-22 ENCOUNTER — Ambulatory Visit (INDEPENDENT_AMBULATORY_CARE_PROVIDER_SITE_OTHER): Payer: HMO

## 2019-10-22 DIAGNOSIS — Z95810 Presence of automatic (implantable) cardiac defibrillator: Secondary | ICD-10-CM

## 2019-10-22 DIAGNOSIS — I5022 Chronic systolic (congestive) heart failure: Secondary | ICD-10-CM | POA: Diagnosis not present

## 2019-10-22 NOTE — Progress Notes (Signed)
EPIC Encounter for ICM Monitoring  Patient Name: [name] is a 85 y.o. male Date: 10/22/2019 Primary Care Physican: [name], MD Primary [place] Electrophysiologist: Allred BiV PPM: 99.5% 09/14/2019 Weight: 185lbs   Attempted call to wife and unable to reach.  Left detailed message per DPR regarding transmission. Transmission reviewed.   OptivolThoracic impedancenormal.  Prescribed:Furosemide40 mg take1 tablettwice a day.  Labs: 07/25/2019 Creatinine 1.20, BUN 15, Potassium 4.3, Sodium 140, GFR 56->60 07/13/2019 Creatinine 1.27, BUN 14, Potassium 4.8, Sodium 141, GFR 52-60  06/07/2019 Creatinine 1.35, BUN 23, Potassium 4.5, Sodium 136, GFR 48-56 05/17/2019 Creatinine 1.19, BUN 15, Potassium 4.8, Sodium 141, GFR 56-65 05/09/2019 Creatinine 1.68, BUN 37, Potassium 4.9, Sodium 139, GFR 37-43 05/05/2019 Creatinine1.29, BUN26, Potassium4.2, Sodium140, [id number] Acomplete set of results can be found in Results Review.  Recommendations:   Left voice mail with ICM number and encouraged to call if experiencing any fluid symptoms.  Follow-up plan: ICM clinic phone appointment on 11/26/2019.  Office appt 10/29/2019 with Dr. Rayann Heman.    Copy of ICM check sent to Dr. Rayann Heman.   3 month ICM trend: 10/22/2019    1 Year ICM trend:       Rosalene Billings, RN 10/22/2019 1:08 PM

## 2019-10-24 ENCOUNTER — Telehealth (HOSPITAL_COMMUNITY): Payer: Self-pay | Admitting: Vascular Surgery

## 2019-10-24 DIAGNOSIS — D0422 Carcinoma in situ of skin of left ear and external auricular canal: Secondary | ICD-10-CM | POA: Diagnosis not present

## 2019-10-24 NOTE — Telephone Encounter (Signed)
Left pt message to make new chf appt  °

## 2019-10-25 DIAGNOSIS — I25118 Atherosclerotic heart disease of native coronary artery with other forms of angina pectoris: Secondary | ICD-10-CM | POA: Diagnosis not present

## 2019-10-25 DIAGNOSIS — E1142 Type 2 diabetes mellitus with diabetic polyneuropathy: Secondary | ICD-10-CM | POA: Diagnosis not present

## 2019-10-25 DIAGNOSIS — E7800 Pure hypercholesterolemia, unspecified: Secondary | ICD-10-CM | POA: Diagnosis not present

## 2019-10-25 DIAGNOSIS — J449 Chronic obstructive pulmonary disease, unspecified: Secondary | ICD-10-CM | POA: Diagnosis not present

## 2019-10-25 DIAGNOSIS — N183 Chronic kidney disease, stage 3 unspecified: Secondary | ICD-10-CM | POA: Diagnosis not present

## 2019-10-25 DIAGNOSIS — I480 Paroxysmal atrial fibrillation: Secondary | ICD-10-CM | POA: Diagnosis not present

## 2019-10-25 DIAGNOSIS — E109 Type 1 diabetes mellitus without complications: Secondary | ICD-10-CM | POA: Diagnosis not present

## 2019-10-25 DIAGNOSIS — I5023 Acute on chronic systolic (congestive) heart failure: Secondary | ICD-10-CM | POA: Diagnosis not present

## 2019-10-25 DIAGNOSIS — E039 Hypothyroidism, unspecified: Secondary | ICD-10-CM | POA: Diagnosis not present

## 2019-10-25 DIAGNOSIS — E1122 Type 2 diabetes mellitus with diabetic chronic kidney disease: Secondary | ICD-10-CM | POA: Diagnosis not present

## 2019-10-25 DIAGNOSIS — E1151 Type 2 diabetes mellitus with diabetic peripheral angiopathy without gangrene: Secondary | ICD-10-CM | POA: Diagnosis not present

## 2019-10-25 DIAGNOSIS — I129 Hypertensive chronic kidney disease with stage 1 through stage 4 chronic kidney disease, or unspecified chronic kidney disease: Secondary | ICD-10-CM | POA: Diagnosis not present

## 2019-10-29 ENCOUNTER — Other Ambulatory Visit: Payer: Self-pay

## 2019-10-29 ENCOUNTER — Telehealth (INDEPENDENT_AMBULATORY_CARE_PROVIDER_SITE_OTHER): Payer: HMO | Admitting: Internal Medicine

## 2019-10-29 DIAGNOSIS — I110 Hypertensive heart disease with heart failure: Secondary | ICD-10-CM | POA: Diagnosis not present

## 2019-10-29 DIAGNOSIS — D6869 Other thrombophilia: Secondary | ICD-10-CM

## 2019-10-29 DIAGNOSIS — I480 Paroxysmal atrial fibrillation: Secondary | ICD-10-CM

## 2019-10-29 DIAGNOSIS — I5022 Chronic systolic (congestive) heart failure: Secondary | ICD-10-CM | POA: Diagnosis not present

## 2019-10-29 DIAGNOSIS — G4733 Obstructive sleep apnea (adult) (pediatric): Secondary | ICD-10-CM

## 2019-10-29 DIAGNOSIS — I10 Essential (primary) hypertension: Secondary | ICD-10-CM

## 2019-10-29 NOTE — Progress Notes (Signed)
Electrophysiology TeleHealth Note  Due to national recommendations of social distancing due to Pandora 19, an audio telehealth visit is felt to be most appropriate for this patient at this time.  Verbal consent was obtained by me for the telehealth visit today.  The patient does not have capability for a virtual visit.  A phone visit is therefore required today.   Date:  10/29/2019   ID:  [name], DOB [birth year], MRN [id number]  Location: patient's home  Provider location:  [facility]  Evaluation Performed: Follow-up visit  PCP:  [name], MD   Electrophysiologist:  Dr Rayann Heman  Chief Complaint:  CHF  History of Present Illness:    [name] is a 86 y.o. male who presents via telehealth conferencing today.  Since his pacemaker generator change and lead revision, the patient reports doing very well.  He has chronic lung disease and SOB.  He has been placed on steroids for reactive air way disease.  Edema is stable.  Today, he denies symptoms of palpitations, chest pain, dizziness, presyncope, or syncope.  The patient is otherwise without complaint today.  The patient denies symptoms of fevers, chills, cough, or new SOB worrisome for COVID 19.  Past Medical History:  Diagnosis Date  . Atrial fibrillation ([place])    persistent, previously seen at [facility] and placed on amiodarone  . Benign prostatic hypertrophy   . CAD (coronary artery disease)    multivessel s/p inferolateral wall MI with subsequent CABG 11/1998.  Cath 2009 with Patent grafts  . Cleft palate   . COPD with emphysema ([place]) 04/01/2010  . DM (diabetes mellitus), type 2 ([place])   . Dyspnea   . GERD (gastroesophageal reflux disease)   . HTN (hypertension)   . Hyperlipidemia   . Hypothyroidism   . Iron deficiency anemia   . Ischemic dilated cardiomyopathy ([place])    EF 35-40% by MUGA 6/11  . Myocardial infarction ([place])   . Nasal septal deviation   . Nephrolithiasis   . OSA (obstructive sleep apnea)     . PAF (paroxysmal atrial fibrillation) ([place])   . Peripheral arterial disease ([facility])    left subclavian artery stenosis  . PNA (pneumonia)   . Psoriasis   . Seborrheic keratosis   . Stroke ([place])   . Systolic congestive heart failure ([place]) 2009   s/p BiV ICD implantation by Dr Leonia Reeves (MDT)    Past Surgical History:  Procedure Laterality Date  . BI-VENTRICULAR IMPLANTABLE CARDIOVERTER DEFIBRILLATOR  (CRT-D)  10-08-08; 11-06-2013   Dr Leonia Reeves (MDT) implant for primary prevention; gen change to MDT VivaXT CRTD by Dr Rayann Heman  . BIV ICD GENERTAOR CHANGE OUT N/A 11/06/2013   Procedure: BIV ICD GENERTAOR CHANGE OUT;  Surgeon: Coralyn Mark, MD;  Location: [facility] CATH LAB;  Service: Cardiovascular;  Laterality: N/A;  . BIV PACEMAKER GENERATOR CHANGEOUT N/A 07/24/2019   Procedure: BIV PACEMAKER GENERATOR CHANGEOUT;  Surgeon: Thompson Grayer, MD;  Location: [place] CV LAB;  Service: Cardiovascular;  Laterality: N/A;  . c-spine surgery    . CARDIOVERSION N/A 04/29/2016   Procedure: CARDIOVERSION;  Surgeon: Pixie Casino, MD;  [facility]OSCOPY;  Service: Cardiovascular;  Laterality: N/A;  . CARPAL TUNNEL RELEASE    . CATARACT EXTRACTION    . CORONARY ARTERY BYPASS GRAFT     LIMA to LAD, SVG to OM, SVG to diagonal  . ELECTROPHYSIOLOGIC STUDY N/A 06/11/2016   Procedure: Atrial Fibrillation Ablation;  Surgeon: Thompson Grayer, MD;  Location:  [place] INVASIVE CV LAB;  Service: Cardiovascular;  Laterality: N/A;  . LEAD REVISION/REPAIR N/A 07/24/2019   Procedure: LEAD REVISION/REPAIR;  Surgeon: Thompson Grayer, MD;  Location: [place] CV LAB;  Service: Cardiovascular;  Laterality: N/A;  . left cleft palate and left cleft lip repair      Current Outpatient Medications  Medication Sig Dispense Refill  . acetaminophen (TYLENOL) 500 MG tablet Take 1,000 mg by mouth at bedtime.     [name] albuterol (PROAIR HFA) 108 (90 Base) MCG/ACT inhaler Inhale 2 puffs into the lungs every 6 (six) hours as needed for wheezing or  shortness of breath.    [name] albuterol (PROVENTIL) (2.5 MG/3ML) 0.083% nebulizer solution USE 1 VIAL VIA NEBULIZER 3 TIMES A DAY AS DIRECTED. DX:J44.9 (Patient taking differently: Take 2.5 mg by nebulization See admin instructions. Nebulize 1 vial every morning and an additional two times daily as needed for shortness of breath or wheezing (DX: J44.9)) 300 mL 3  . amiodarone (PACERONE) 200 MG tablet Take 1 tablet (200 mg total) by mouth daily. 90 tablet 3  . atorvastatin (LIPITOR) 80 MG tablet TAKE 1 TABLET (80 MG TOTAL) BY MOUTH DAILY. (Patient taking differently: Take 80 mg by mouth daily. ) 90 tablet 3  . bisacodyl (DULCOLAX) 5 MG EC tablet Take 10 mg by mouth daily as needed (constipation).    . carvedilol (COREG) 3.125 MG tablet Take 1 tablet (3.125 mg total) by mouth 2 (two) times daily with a meal. 180 tablet 3  . Cholecalciferol (VITAMIN D3) 50 MCG (2000 UT) capsule Take 2,000 Units by mouth every evening.    [name] dextromethorphan-guaiFENesin (MUCINEX DM) 30-600 MG 12hr tablet Take 1 tablet by mouth 2 (two) times daily as needed for cough.    . doxycycline (VIBRA-TABS) 100 MG tablet Take 1 tablet (100 mg total) by mouth 2 (two) times daily. (Patient not taking: Reported on 07/19/2019) 14 tablet 0  . feeding supplement, ENSURE ENLIVE, (ENSURE ENLIVE) LIQD Take 237 mLs by mouth 2 (two) times daily between meals. 60 Bottle 0  . ferrous sulfate 325 (65 FE) MG tablet Take 325 mg by mouth every evening.     . fluticasone (FLONASE) 50 MCG/ACT nasal spray SPRAY 2 SPRAYS INTO EACH NOSTRIL EVERY DAY (Patient taking differently: Place 2 sprays into both nostrils daily. ) 48 g 3  . furosemide (LASIX) 40 MG tablet Take 1 tablet (40 mg total) by mouth 2 (two) times daily. 120 tablet 1  . Insulin Degludec-Liraglutide (XULTOPHY) 100-3.6 UNIT-MG/ML SOPN Inject 14 Units into the skin daily.     [name] levothyroxine (SYNTHROID, LEVOTHROID) 100 MCG tablet Take 100 mcg by mouth daily before breakfast.     . Multiple  Vitamins-Minerals (PRESERVISION AREDS PO) Take 1 capsule by mouth 2 (two) times daily.     . potassium chloride 20 MEQ TBCR Take 20 mEq by mouth daily. 30 tablet 0  . PRESCRIPTION MEDICATION See admin instructions. CPAP- At bedtime    . senna (SENOKOT) 8.6 MG tablet Take 1 tablet by mouth 2 (two) times daily. HOLD FOR LOOSE STOOLS    . traMADol (ULTRAM) 50 MG tablet Take 50 mg by mouth See admin instructions. Take 1 tablet (50 mg) by mouth scheduled at bedtime, may take an additional 2 doses if needed for pain.  0  . TRELEGY ELLIPTA 100-62.5-25 MCG/INH AEPB INHALE 1 PUFF INTO LUNGS ONCE A DAY (Patient taking differently: Inhale 1 puff into the lungs daily. ) 60 each 3  . vitamin B-12 (CYANOCOBALAMIN)  1000 MCG tablet Take 1,000 mcg by mouth daily.    [name] warfarin (COUMADIN) 5 MG tablet TAKE AS DIRECTED BY COUMADIN CLINIC 35 tablet 3   No current facility-administered medications for this visit.    Allergies:   Adhesive [tape], Aldactone [spironolactone], and Losartan   Social History:  The patient  reports that he quit smoking about 21 years ago. His smoking use included cigarettes. He has a 195.00 pack-year smoking history. He has never used smokeless tobacco. He reports that he does not drink alcohol or use drugs.   Family History:  The patient's family history includes Asthma in his father; Heart attack in his brother; Heart disease in his brother; Hypertension in his brother, father, mother, and sister; Stroke in his father.   ROS:  Please see the history of present illness.   All other systems are personally reviewed and negative.    Exam:    Vital Signs:  There were no vitals taken for this visit.  Well sounding, alert and conversant  Labs/Other Tests and Data Reviewed:    Recent Labs: 05/02/2019: ALT 14; B Natriuretic Peptide 1,255.8 05/03/2019: TSH 2.004 05/09/2019: NT-Pro BNP 12,014 07/13/2019: Hemoglobin 13.7; Platelets 188 07/25/2019: BUN 15; Creatinine, Ser 1.20; Potassium 4.3;  Sodium 140   Wt Readings from Last 3 Encounters:  07/25/19 186 lb 14.4 oz (84.8 kg)  07/13/19 194 lb (88 kg)  07/09/19 190 lb (86.2 kg)     Last device interrogation is reviewed from [facility] PDF which reveals normal device function,     ASSESSMENT & PLAN:    1.  Chronic systolic dysfunction euvolemic Doing well s/p RV and LV lead revisions with a new MDT 5076 RV lead and a MDT 3830 Left bundle pacing lead placed.  Successful down grade to biventricular pacemaker for treatment of CHF Followed in ICM device clinic  2. Persistent afib Doing well post ablation on amiodarone 200mg  daily Continue amiodarone  3. OSA Uses CPAP  4. CAD No ischemic symptoms  5. HTN Stable No change required today  6. Obesity Weight loss is advised  Follow-up:  Remotes Return to see EP PA in 6 months   Patient Risk:  after full review of this patients clinical status, I feel that they are at moderate risk at this time.  Today, I have spent 15 minutes with the patient with telehealth technology discussing arrhythmia management .    Army Fossa, MD  10/29/2019 10:41 AM     [facility] [address] [place] [address] [phone] (office) [phone] (fax)

## 2019-11-01 ENCOUNTER — Other Ambulatory Visit: Payer: Self-pay | Admitting: Pulmonary Disease

## 2019-11-01 DIAGNOSIS — R05 Cough: Secondary | ICD-10-CM | POA: Diagnosis not present

## 2019-11-01 DIAGNOSIS — J441 Chronic obstructive pulmonary disease with (acute) exacerbation: Secondary | ICD-10-CM | POA: Diagnosis not present

## 2019-11-02 ENCOUNTER — Ambulatory Visit: Payer: Self-pay

## 2019-11-07 ENCOUNTER — Ambulatory Visit (INDEPENDENT_AMBULATORY_CARE_PROVIDER_SITE_OTHER): Payer: HMO

## 2019-11-07 ENCOUNTER — Other Ambulatory Visit: Payer: Self-pay

## 2019-11-07 DIAGNOSIS — Z7901 Long term (current) use of anticoagulants: Secondary | ICD-10-CM | POA: Diagnosis not present

## 2019-11-07 DIAGNOSIS — Z5181 Encounter for therapeutic drug level monitoring: Secondary | ICD-10-CM

## 2019-11-07 DIAGNOSIS — I4819 Other persistent atrial fibrillation: Secondary | ICD-10-CM

## 2019-11-07 DIAGNOSIS — I480 Paroxysmal atrial fibrillation: Secondary | ICD-10-CM

## 2019-11-07 LAB — POCT INR: INR: 3.2 — AB (ref 2.0–3.0)

## 2019-11-07 NOTE — Patient Instructions (Signed)
-   take 1/2 tablet today, - have a serving of greens, - then continue taking 1 tablet everyday EXCEPT 1/2 [place].   - Recheck INR in 4 weeks.

## 2019-11-21 ENCOUNTER — Emergency Department (HOSPITAL_COMMUNITY)
Admission: EM | Admit: 2019-11-21 | Discharge: 2019-11-21 | Disposition: A | Discharge status: Home / Self Care | Payer: HMO | Attending: Emergency Medicine | Admitting: Emergency Medicine

## 2019-11-21 ENCOUNTER — Other Ambulatory Visit: Payer: Self-pay

## 2019-11-21 ENCOUNTER — Emergency Department (HOSPITAL_COMMUNITY): Payer: HMO

## 2019-11-21 DIAGNOSIS — Z79899 Other long term (current) drug therapy: Secondary | ICD-10-CM | POA: Diagnosis not present

## 2019-11-21 DIAGNOSIS — Z23 Encounter for immunization: Secondary | ICD-10-CM | POA: Insufficient documentation

## 2019-11-21 DIAGNOSIS — E1122 Type 2 diabetes mellitus with diabetic chronic kidney disease: Secondary | ICD-10-CM | POA: Diagnosis not present

## 2019-11-21 DIAGNOSIS — I2510 Atherosclerotic heart disease of native coronary artery without angina pectoris: Secondary | ICD-10-CM | POA: Insufficient documentation

## 2019-11-21 DIAGNOSIS — R0602 Shortness of breath: Secondary | ICD-10-CM | POA: Diagnosis not present

## 2019-11-21 DIAGNOSIS — N183 Chronic kidney disease, stage 3 unspecified: Secondary | ICD-10-CM | POA: Diagnosis not present

## 2019-11-21 DIAGNOSIS — S0990XA Unspecified injury of head, initial encounter: Secondary | ICD-10-CM | POA: Diagnosis not present

## 2019-11-21 DIAGNOSIS — Y9389 Activity, other specified: Secondary | ICD-10-CM | POA: Diagnosis not present

## 2019-11-21 DIAGNOSIS — Z87891 Personal history of nicotine dependence: Secondary | ICD-10-CM | POA: Diagnosis not present

## 2019-11-21 DIAGNOSIS — I5022 Chronic systolic (congestive) heart failure: Secondary | ICD-10-CM | POA: Insufficient documentation

## 2019-11-21 DIAGNOSIS — Y92410 Unspecified street and highway as the place of occurrence of the external cause: Secondary | ICD-10-CM | POA: Diagnosis not present

## 2019-11-21 DIAGNOSIS — S199XXA Unspecified injury of neck, initial encounter: Secondary | ICD-10-CM | POA: Diagnosis not present

## 2019-11-21 DIAGNOSIS — R0789 Other chest pain: Secondary | ICD-10-CM | POA: Diagnosis not present

## 2019-11-21 DIAGNOSIS — Y999 Unspecified external cause status: Secondary | ICD-10-CM | POA: Insufficient documentation

## 2019-11-21 DIAGNOSIS — E039 Hypothyroidism, unspecified: Secondary | ICD-10-CM | POA: Diagnosis not present

## 2019-11-21 DIAGNOSIS — J449 Chronic obstructive pulmonary disease, unspecified: Secondary | ICD-10-CM | POA: Insufficient documentation

## 2019-11-21 DIAGNOSIS — R52 Pain, unspecified: Secondary | ICD-10-CM | POA: Diagnosis not present

## 2019-11-21 DIAGNOSIS — I130 Hypertensive heart and chronic kidney disease with heart failure and stage 1 through stage 4 chronic kidney disease, or unspecified chronic kidney disease: Secondary | ICD-10-CM | POA: Insufficient documentation

## 2019-11-21 DIAGNOSIS — R0902 Hypoxemia: Secondary | ICD-10-CM | POA: Diagnosis not present

## 2019-11-21 DIAGNOSIS — S299XXA Unspecified injury of thorax, initial encounter: Secondary | ICD-10-CM | POA: Diagnosis not present

## 2019-11-21 DIAGNOSIS — S3991XA Unspecified injury of abdomen, initial encounter: Secondary | ICD-10-CM | POA: Diagnosis not present

## 2019-11-21 DIAGNOSIS — Z95810 Presence of automatic (implantable) cardiac defibrillator: Secondary | ICD-10-CM | POA: Insufficient documentation

## 2019-11-21 DIAGNOSIS — Z794 Long term (current) use of insulin: Secondary | ICD-10-CM | POA: Insufficient documentation

## 2019-11-21 DIAGNOSIS — Z7901 Long term (current) use of anticoagulants: Secondary | ICD-10-CM | POA: Diagnosis not present

## 2019-11-21 DIAGNOSIS — Z951 Presence of aortocoronary bypass graft: Secondary | ICD-10-CM | POA: Diagnosis not present

## 2019-11-21 DIAGNOSIS — R1012 Left upper quadrant pain: Secondary | ICD-10-CM | POA: Insufficient documentation

## 2019-11-21 DIAGNOSIS — R079 Chest pain, unspecified: Secondary | ICD-10-CM | POA: Diagnosis not present

## 2019-11-21 LAB — CDS SEROLOGY

## 2019-11-21 LAB — COMPREHENSIVE METABOLIC PANEL
ALT: 16 U/L (ref 0–44)
AST: 24 U/L (ref 15–41)
Albumin: 3 g/dL — ABNORMAL LOW (ref 3.5–5.0)
Alkaline Phosphatase: 79 U/L (ref 38–126)
Anion gap: 9 (ref 5–15)
BUN: 15 mg/dL (ref 8–23)
CO2: 26 mmol/L (ref 22–32)
Calcium: 8.7 mg/dL — ABNORMAL LOW (ref 8.9–10.3)
Chloride: 102 mmol/L (ref 98–111)
Creatinine, Ser: 1.4 mg/dL — ABNORMAL HIGH (ref 0.61–1.24)
GFR calc Af Amer: 53 mL/min — ABNORMAL LOW (ref >60)
GFR calc non Af Amer: 46 mL/min — ABNORMAL LOW (ref >60)
Glucose, Bld: 163 mg/dL — ABNORMAL HIGH (ref 70–99)
Potassium: 4.3 mmol/L (ref 3.5–5.1)
Sodium: 137 mmol/L (ref 135–145)
Total Bilirubin: 0.6 mg/dL (ref 0.3–1.2)
Total Protein: 6.4 g/dL — ABNORMAL LOW (ref 6.5–8.1)

## 2019-11-21 LAB — I-STAT CHEM 8, ED
BUN: 16 mg/dL (ref 8–23)
Calcium, Ion: 1.02 mmol/L — ABNORMAL LOW (ref 1.15–1.40)
Chloride: 104 mmol/L (ref 98–111)
Creatinine, Ser: 1.4 mg/dL — ABNORMAL HIGH (ref 0.61–1.24)
Glucose, Bld: 160 mg/dL — ABNORMAL HIGH (ref 70–99)
HCT: 38 % — ABNORMAL LOW (ref 39.0–52.0)
Hemoglobin: 12.9 g/dL — ABNORMAL LOW (ref 13.0–17.0)
Potassium: 4.3 mmol/L (ref 3.5–5.1)
Sodium: 137 mmol/L (ref 135–145)
TCO2: 26 mmol/L (ref 22–32)

## 2019-11-21 LAB — PROTIME-INR
INR: 3 — ABNORMAL HIGH (ref 0.8–1.2)
Prothrombin Time: 31.2 seconds — ABNORMAL HIGH (ref 11.4–15.2)

## 2019-11-21 LAB — CBG MONITORING, ED: Glucose-Capillary: 137 mg/dL — ABNORMAL HIGH (ref 70–99)

## 2019-11-21 LAB — LACTIC ACID, PLASMA: Lactic Acid, Venous: 1.1 mmol/L (ref 0.5–1.9)

## 2019-11-21 LAB — CBC
HCT: 41.2 % (ref 39.0–52.0)
Hemoglobin: 13.2 g/dL (ref 13.0–17.0)
MCH: 30.1 pg (ref 26.0–34.0)
MCHC: 32 g/dL (ref 30.0–36.0)
MCV: 94.1 fL (ref 80.0–100.0)
Platelets: 224 10*3/uL (ref 150–400)
RBC: 4.38 MIL/uL (ref 4.22–5.81)
RDW: 14.8 % (ref 11.5–15.5)
WBC: 7.9 10*3/uL (ref 4.0–10.5)
nRBC: 0 % (ref 0.0–0.2)

## 2019-11-21 LAB — SAMPLE TO BLOOD BANK

## 2019-11-21 LAB — ETHANOL: Alcohol, Ethyl (B): <10 mg/dL (ref <10)

## 2019-11-21 MED ORDER — FENTANYL CITRATE (PF) 100 MCG/2ML IJ SOLN
50.0000 ug | Freq: Once | INTRAMUSCULAR | Status: AC
  Administered 2019-11-21: 50 ug via INTRAVENOUS
  Filled 2019-11-21: qty 2

## 2019-11-21 MED ORDER — FUROSEMIDE 10 MG/ML IJ SOLN
40.0000 mg | Freq: Once | INTRAMUSCULAR | Status: AC
  Administered 2019-11-21: 40 mg via INTRAVENOUS
  Filled 2019-11-21: qty 4

## 2019-11-21 MED ORDER — ACETAMINOPHEN 500 MG PO TABS
1000.0000 mg | ORAL_TABLET | Freq: Once | ORAL | Status: AC
  Administered 2019-11-21: 1000 mg via ORAL
  Filled 2019-11-21: qty 2

## 2019-11-21 MED ORDER — TETANUS-DIPHTH-ACELL PERTUSSIS 5-2.5-18.5 LF-MCG/0.5 IM SUSP
0.5000 mL | Freq: Once | INTRAMUSCULAR | Status: AC
  Administered 2019-11-21: 0.5 mL via INTRAMUSCULAR
  Filled 2019-11-21: qty 0.5

## 2019-11-21 MED ORDER — IOHEXOL 300 MG/ML  SOLN
100.0000 mL | Freq: Once | INTRAMUSCULAR | Status: AC | PRN
  Administered 2019-11-21: 100 mL via INTRAVENOUS

## 2019-11-21 NOTE — ED Notes (Signed)
Pt reported that he does not think he can walk at this time. He states he uses a walker at home. This EMT offered to get him a walker, but he states that he is in too much pain to walk. Rates the pain as 6/10.

## 2019-11-21 NOTE — ED Notes (Signed)
Please call spouse Abishek Ruback @ [phone] a status update--[name]

## 2019-11-21 NOTE — ED Notes (Signed)
Pt transported to CT ?

## 2019-11-21 NOTE — Patient Outreach (Signed)
[facility]) Care Management Chronic Special Needs Program  11/21/2019  Name: [name] DOB: [birth year]  MRN: [id number]  Mr. Khiyan Romenesko is enrolled in a chronic special needs plan for Heart Failure  Chronic Care Management Coordinator telephoned client to complete health risk assessment and update individualized care plan. RNCM reviewed the chronic care management program, importance of client participation, and taking their care plan to all provider appointments and inpatient facilities.    Subjective: RNCM spoke with client's wife designated party release as client is hard of hearing. Health risk assessment for 2021 completed. Per Mrs. Palazzola, client has history of heart failure, diabetes, HTN, COPD, atrial fibrillation, heart attack and stroke. Client is able to bath and dress self. She states she assist with transportation and meal planning. Mrs. Swiney states client also takes medications without any problems. Client is on greater than 10 medications. Last A1C per Mrs. Asch was 6.9 in 08/2019. Blood sugar this morning was 85.  History of heart failure. She reports client has been attending cardiology visits. Client also have internal cardiac monitoring. Follow up with doctor for heart failure and provider for management of atrial fibrillation. Mrs. Rutkowski reports client rarely has swelling in ankles or legs. She states client weighs self daily and records weights daily. Per client , weight today was 184 pounds.  History of COPD. Mrs. Aroche reports client's main issue currently is shortness of breath due to his lung problems. Client is active with [facility] for community care management. She states Nurse practioner has been out to see client and that primary care is following client. Mrs. Huyck states that client also sees Dr. Halford Chessman. She states client is taking medications for COPD as prescribed and so far his oxygen saturation levels, blood  pressure, daily weights have been "good" and were "good" when the [facility] provider made home visit. Last home visit per [facility] provider was 10/24/2019. Next [facility] provider visit scheduled for 12/12/2019.  Mrs Zesati reports client has advanced directive, but request RNCM send a copy in case client wants to make changes.   PHQ2=3; PHQ9=12. Client declines social work referral and states he does not feel that he needs to discuss with primary care provider.  Goals Addressed            This Visit's Progress   . Client will not report change from baseline and no repeated symptoms of stroke with in the next 6-9 months    On track    Reports no change from baseline related to history of stroke.  Continue to take medications as scheduled. Continue to follow up with providers as scheduled.  Stroke Symptoms-Spot a stroke F.A.S.T. FACE DROOPING Does one side of the face droop or is it numb? Ask the person to smile. ARM WEAKNESS Is one arm weak or numb? Ask the person to raise both arms. Does one arm drift downward? SPEECH DIFFICULTY Is speech slurred, are they unable to speak, or are they hard to understand? Ask the person to repeat a simple sentence, like "the sky is blue." Is the sentence repeated correctly? TIME TO CALL 9-1-1 If the person shows any of these symptoms, even if the symptoms go away, call 9-1-1 and get them to the hospital immediately.      . Client will report no fall or injuries in the next 6-9 months.   On track    Please use your walker as recommended by your provider when walking. Please keep walkways clear of clutter.  Avoid use of throw rugs. Wear non-slip shoes. Please have plenty of lighting in room. Install grab bars in bathroom shower/tub area if possible. If you need assistance with this, call the [employer] [facility] [place] [phone] to ask if they know of any resources available to help with this.  I am sending educational material  regarding fall prevention. Please review and call if you have any questions.     . Client will report no worsening of symptoms of Atrial Fibrillation within the next  6-9 months   On track    Denies any worsening of symptoms of atrial fibrillation. Continue to follow up with provider as scheduled. Continue to take medications as scheduled.    . Client will verbalize knowledge of diabetes self-management as evidenced by Hgb A1C <7 or as defined by provider.   On track    A1C 7.2 on 04/13/2019     . COMPLETED: Maintain timely refills of Heart Failure medication as prescribed within the year        Denies any difficulty obtaining medications.    . Obtain annual  Lipid Profile, LDL-C   On track    Done 04/13/2019    . Visit Primary Care Provider or Cardiologist at least 2 times per year   On track    Primary care visit 08/22/2019, 06/12/2019 Cardiology-last several visits 10/29/2019; 09/17/2019;09/11/2019; 08/09/2019  Please complete annual wellness visit for the year 2021    . COMPLETED: Will report improvement in foot pain within the next 3-6 months.       Wife reports Foot pain resolved.      Covid-19 precautions discussed. RNCM also encouraged client to call [facility] as needed. RNCM reinforced the helath care concierge is available for benefits questions. And Mrs. Kassabian encouraged to call RNCM as needed. Client encouraged to call RNCM for any resource needs.  Plan: send advanced directive packet; send education material, send updated care plan to client; send updated care plan to primary care. RNCM will send pharmacy referral for medication review as client is taking greater than 10 medications.  Primary care notified of PHQ2/PHQ9 scores.   Thea Silversmith, RN, MSN, [place] [phone]

## 2019-11-21 NOTE — ED Notes (Signed)
Discharge instructions discussed with Pt. Pt verbalized understanding. Pt stable and discharged via wheelchair with wife.

## 2019-11-21 NOTE — Discharge Instructions (Signed)
Use the incentive spirometer 10 minutes out of every hour while you are awake.  Take your home tramadol for pain.  Take Tylenol around-the-clock as well.  Return for worsening trouble breathing or fever.  Call your doctor and discuss with them that we saw more fluid on your lungs that you had here previously.  See if they want increase your Lasix.  Take tylenol 1000mg (2 extra strength) four times a day.

## 2019-11-21 NOTE — Progress Notes (Signed)
Orthopedic Tech Progress Note Patient Details:  [name] [birth year] [id number] Level 2 trauma Patient ID: [name], male   DOB: 07/01/1934, 86 y.o.   MRN: [id number]   [name] 11/21/2019, 5:22 PM

## 2019-11-21 NOTE — Consult Note (Signed)
Responded to page, pt unavailable, no family present, staff will page again if further need of chaplain services.  Rev. Kaylianna Detert Chaplain 
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
## 2019-11-21 NOTE — ED Notes (Signed)
Family updated as to patient's status. Spoke with pt's wife.
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
## 2019-11-22 NOTE — Addendum Note (Signed)
Addended by: Luretha Rued on: 11/22/2019 11:42 AM   Modules accepted: Orders

## 2019-11-26 ENCOUNTER — Other Ambulatory Visit: Payer: Self-pay

## 2019-11-26 ENCOUNTER — Inpatient Hospital Stay (HOSPITAL_COMMUNITY)
Admission: EM | Admit: 2019-11-26 | Discharge: 2019-12-06 | DRG: 291 | Disposition: A | Discharge status: Home Health | Payer: HMO | Attending: Internal Medicine | Admitting: Internal Medicine

## 2019-11-26 ENCOUNTER — Encounter (HOSPITAL_COMMUNITY): Payer: Self-pay

## 2019-11-26 ENCOUNTER — Ambulatory Visit (INDEPENDENT_AMBULATORY_CARE_PROVIDER_SITE_OTHER): Payer: HMO

## 2019-11-26 ENCOUNTER — Emergency Department (HOSPITAL_COMMUNITY): Payer: HMO

## 2019-11-26 ENCOUNTER — Other Ambulatory Visit: Payer: Self-pay | Admitting: *Deleted

## 2019-11-26 DIAGNOSIS — Z87891 Personal history of nicotine dependence: Secondary | ICD-10-CM | POA: Diagnosis not present

## 2019-11-26 DIAGNOSIS — N400 Enlarged prostate without lower urinary tract symptoms: Secondary | ICD-10-CM | POA: Diagnosis not present

## 2019-11-26 DIAGNOSIS — Z9889 Other specified postprocedural states: Secondary | ICD-10-CM

## 2019-11-26 DIAGNOSIS — I5022 Chronic systolic (congestive) heart failure: Secondary | ICD-10-CM

## 2019-11-26 DIAGNOSIS — I361 Nonrheumatic tricuspid (valve) insufficiency: Secondary | ICD-10-CM | POA: Diagnosis not present

## 2019-11-26 DIAGNOSIS — R0989 Other specified symptoms and signs involving the circulatory and respiratory systems: Secondary | ICD-10-CM | POA: Diagnosis not present

## 2019-11-26 DIAGNOSIS — I130 Hypertensive heart and chronic kidney disease with heart failure and stage 1 through stage 4 chronic kidney disease, or unspecified chronic kidney disease: Secondary | ICD-10-CM | POA: Diagnosis not present

## 2019-11-26 DIAGNOSIS — I509 Heart failure, unspecified: Secondary | ICD-10-CM

## 2019-11-26 DIAGNOSIS — E1122 Type 2 diabetes mellitus with diabetic chronic kidney disease: Secondary | ICD-10-CM | POA: Diagnosis present

## 2019-11-26 DIAGNOSIS — Y831 Surgical operation with implant of artificial internal device as the cause of abnormal reaction of the patient, or of later complication, without mention of misadventure at the time of the procedure: Secondary | ICD-10-CM | POA: Diagnosis present

## 2019-11-26 DIAGNOSIS — E1165 Type 2 diabetes mellitus with hyperglycemia: Secondary | ICD-10-CM | POA: Diagnosis present

## 2019-11-26 DIAGNOSIS — I4819 Other persistent atrial fibrillation: Secondary | ICD-10-CM | POA: Diagnosis not present

## 2019-11-26 DIAGNOSIS — Z452 Encounter for adjustment and management of vascular access device: Secondary | ICD-10-CM | POA: Diagnosis not present

## 2019-11-26 DIAGNOSIS — N1831 Chronic kidney disease, stage 3a: Secondary | ICD-10-CM | POA: Diagnosis not present

## 2019-11-26 DIAGNOSIS — E875 Hyperkalemia: Secondary | ICD-10-CM | POA: Diagnosis present

## 2019-11-26 DIAGNOSIS — I5023 Acute on chronic systolic (congestive) heart failure: Secondary | ICD-10-CM | POA: Diagnosis not present

## 2019-11-26 DIAGNOSIS — R778 Other specified abnormalities of plasma proteins: Secondary | ICD-10-CM | POA: Diagnosis not present

## 2019-11-26 DIAGNOSIS — T827XXA Infection and inflammatory reaction due to other cardiac and vascular devices, implants and grafts, initial encounter: Secondary | ICD-10-CM | POA: Diagnosis not present

## 2019-11-26 DIAGNOSIS — Z91048 Other nonmedicinal substance allergy status: Secondary | ICD-10-CM | POA: Diagnosis not present

## 2019-11-26 DIAGNOSIS — R7989 Other specified abnormal findings of blood chemistry: Secondary | ICD-10-CM | POA: Diagnosis present

## 2019-11-26 DIAGNOSIS — J90 Pleural effusion, not elsewhere classified: Secondary | ICD-10-CM | POA: Diagnosis not present

## 2019-11-26 DIAGNOSIS — Z794 Long term (current) use of insulin: Secondary | ICD-10-CM

## 2019-11-26 DIAGNOSIS — Z95810 Presence of automatic (implantable) cardiac defibrillator: Secondary | ICD-10-CM | POA: Diagnosis not present

## 2019-11-26 DIAGNOSIS — B9561 Methicillin susceptible Staphylococcus aureus infection as the cause of diseases classified elsewhere: Secondary | ICD-10-CM | POA: Diagnosis not present

## 2019-11-26 DIAGNOSIS — I255 Ischemic cardiomyopathy: Secondary | ICD-10-CM | POA: Diagnosis present

## 2019-11-26 DIAGNOSIS — S20219D Contusion of unspecified front wall of thorax, subsequent encounter: Secondary | ICD-10-CM

## 2019-11-26 DIAGNOSIS — J918 Pleural effusion in other conditions classified elsewhere: Secondary | ICD-10-CM | POA: Diagnosis present

## 2019-11-26 DIAGNOSIS — I708 Atherosclerosis of other arteries: Secondary | ICD-10-CM | POA: Diagnosis present

## 2019-11-26 DIAGNOSIS — I110 Hypertensive heart disease with heart failure: Secondary | ICD-10-CM | POA: Diagnosis not present

## 2019-11-26 DIAGNOSIS — R52 Pain, unspecified: Secondary | ICD-10-CM | POA: Diagnosis not present

## 2019-11-26 DIAGNOSIS — E785 Hyperlipidemia, unspecified: Secondary | ICD-10-CM | POA: Diagnosis present

## 2019-11-26 DIAGNOSIS — T148XXA Other injury of unspecified body region, initial encounter: Secondary | ICD-10-CM | POA: Diagnosis not present

## 2019-11-26 DIAGNOSIS — I10 Essential (primary) hypertension: Secondary | ICD-10-CM | POA: Diagnosis not present

## 2019-11-26 DIAGNOSIS — M8448XD Pathological fracture, other site, subsequent encounter for fracture with routine healing: Secondary | ICD-10-CM | POA: Diagnosis not present

## 2019-11-26 DIAGNOSIS — Z66 Do not resuscitate: Secondary | ICD-10-CM | POA: Diagnosis not present

## 2019-11-26 DIAGNOSIS — R54 Age-related physical debility: Secondary | ICD-10-CM | POA: Diagnosis present

## 2019-11-26 DIAGNOSIS — I4891 Unspecified atrial fibrillation: Secondary | ICD-10-CM | POA: Diagnosis not present

## 2019-11-26 DIAGNOSIS — S20319A Abrasion of unspecified front wall of thorax, initial encounter: Secondary | ICD-10-CM | POA: Diagnosis present

## 2019-11-26 DIAGNOSIS — I2510 Atherosclerotic heart disease of native coronary artery without angina pectoris: Secondary | ICD-10-CM | POA: Diagnosis present

## 2019-11-26 DIAGNOSIS — N183 Chronic kidney disease, stage 3 unspecified: Secondary | ICD-10-CM

## 2019-11-26 DIAGNOSIS — Z209 Contact with and (suspected) exposure to unspecified communicable disease: Secondary | ICD-10-CM | POA: Diagnosis not present

## 2019-11-26 DIAGNOSIS — I252 Old myocardial infarction: Secondary | ICD-10-CM | POA: Diagnosis not present

## 2019-11-26 DIAGNOSIS — Z825 Family history of asthma and other chronic lower respiratory diseases: Secondary | ICD-10-CM

## 2019-11-26 DIAGNOSIS — E1151 Type 2 diabetes mellitus with diabetic peripheral angiopathy without gangrene: Secondary | ICD-10-CM | POA: Diagnosis present

## 2019-11-26 DIAGNOSIS — T22052A Burn of unspecified degree of left shoulder, initial encounter: Secondary | ICD-10-CM | POA: Diagnosis not present

## 2019-11-26 DIAGNOSIS — J441 Chronic obstructive pulmonary disease with (acute) exacerbation: Secondary | ICD-10-CM | POA: Diagnosis not present

## 2019-11-26 DIAGNOSIS — R0602 Shortness of breath: Secondary | ICD-10-CM | POA: Diagnosis not present

## 2019-11-26 DIAGNOSIS — W2210XA Striking against or struck by unspecified automobile airbag, initial encounter: Secondary | ICD-10-CM | POA: Diagnosis not present

## 2019-11-26 DIAGNOSIS — I771 Stricture of artery: Secondary | ICD-10-CM | POA: Diagnosis not present

## 2019-11-26 DIAGNOSIS — R791 Abnormal coagulation profile: Secondary | ICD-10-CM | POA: Diagnosis not present

## 2019-11-26 DIAGNOSIS — R7881 Bacteremia: Secondary | ICD-10-CM

## 2019-11-26 DIAGNOSIS — Z951 Presence of aortocoronary bypass graft: Secondary | ICD-10-CM

## 2019-11-26 DIAGNOSIS — L989 Disorder of the skin and subcutaneous tissue, unspecified: Secondary | ICD-10-CM | POA: Diagnosis present

## 2019-11-26 DIAGNOSIS — I447 Left bundle-branch block, unspecified: Secondary | ICD-10-CM | POA: Diagnosis not present

## 2019-11-26 DIAGNOSIS — X088XXA Exposure to other specified smoke, fire and flames, initial encounter: Secondary | ICD-10-CM | POA: Diagnosis not present

## 2019-11-26 DIAGNOSIS — Z7901 Long term (current) use of anticoagulants: Secondary | ICD-10-CM

## 2019-11-26 DIAGNOSIS — I4821 Permanent atrial fibrillation: Secondary | ICD-10-CM | POA: Diagnosis not present

## 2019-11-26 DIAGNOSIS — Z8673 Personal history of transient ischemic attack (TIA), and cerebral infarction without residual deficits: Secondary | ICD-10-CM

## 2019-11-26 DIAGNOSIS — T827XXD Infection and inflammatory reaction due to other cardiac and vascular devices, implants and grafts, subsequent encounter: Secondary | ICD-10-CM | POA: Diagnosis not present

## 2019-11-26 DIAGNOSIS — Z87442 Personal history of urinary calculi: Secondary | ICD-10-CM

## 2019-11-26 DIAGNOSIS — I959 Hypotension, unspecified: Secondary | ICD-10-CM | POA: Diagnosis not present

## 2019-11-26 DIAGNOSIS — Z87828 Personal history of other (healed) physical injury and trauma: Secondary | ICD-10-CM | POA: Diagnosis not present

## 2019-11-26 DIAGNOSIS — Z8249 Family history of ischemic heart disease and other diseases of the circulatory system: Secondary | ICD-10-CM

## 2019-11-26 DIAGNOSIS — Z20822 Contact with and (suspected) exposure to covid-19: Secondary | ICD-10-CM | POA: Diagnosis not present

## 2019-11-26 DIAGNOSIS — S20212A Contusion of left front wall of thorax, initial encounter: Secondary | ICD-10-CM | POA: Diagnosis present

## 2019-11-26 DIAGNOSIS — J189 Pneumonia, unspecified organism: Secondary | ICD-10-CM

## 2019-11-26 DIAGNOSIS — G4733 Obstructive sleep apnea (adult) (pediatric): Secondary | ICD-10-CM | POA: Diagnosis not present

## 2019-11-26 DIAGNOSIS — I878 Other specified disorders of veins: Secondary | ICD-10-CM | POA: Diagnosis present

## 2019-11-26 DIAGNOSIS — E039 Hypothyroidism, unspecified: Secondary | ICD-10-CM | POA: Diagnosis present

## 2019-11-26 DIAGNOSIS — J9621 Acute and chronic respiratory failure with hypoxia: Secondary | ICD-10-CM | POA: Diagnosis not present

## 2019-11-26 DIAGNOSIS — Z7982 Long term (current) use of aspirin: Secondary | ICD-10-CM

## 2019-11-26 DIAGNOSIS — H9190 Unspecified hearing loss, unspecified ear: Secondary | ICD-10-CM | POA: Diagnosis not present

## 2019-11-26 DIAGNOSIS — J440 Chronic obstructive pulmonary disease with acute lower respiratory infection: Secondary | ICD-10-CM | POA: Diagnosis present

## 2019-11-26 DIAGNOSIS — R079 Chest pain, unspecified: Secondary | ICD-10-CM | POA: Diagnosis not present

## 2019-11-26 DIAGNOSIS — Z823 Family history of stroke: Secondary | ICD-10-CM

## 2019-11-26 DIAGNOSIS — M25512 Pain in left shoulder: Secondary | ICD-10-CM | POA: Diagnosis not present

## 2019-11-26 DIAGNOSIS — Z888 Allergy status to other drugs, medicaments and biological substances status: Secondary | ICD-10-CM | POA: Diagnosis not present

## 2019-11-26 DIAGNOSIS — I340 Nonrheumatic mitral (valve) insufficiency: Secondary | ICD-10-CM | POA: Diagnosis not present

## 2019-11-26 DIAGNOSIS — K219 Gastro-esophageal reflux disease without esophagitis: Secondary | ICD-10-CM | POA: Diagnosis present

## 2019-11-26 DIAGNOSIS — J811 Chronic pulmonary edema: Secondary | ICD-10-CM | POA: Diagnosis not present

## 2019-11-26 DIAGNOSIS — I5021 Acute systolic (congestive) heart failure: Secondary | ICD-10-CM | POA: Diagnosis not present

## 2019-11-26 DIAGNOSIS — R0902 Hypoxemia: Secondary | ICD-10-CM | POA: Diagnosis not present

## 2019-11-26 DIAGNOSIS — J9601 Acute respiratory failure with hypoxia: Secondary | ICD-10-CM | POA: Diagnosis not present

## 2019-11-26 DIAGNOSIS — I083 Combined rheumatic disorders of mitral, aortic and tricuspid valves: Secondary | ICD-10-CM | POA: Diagnosis not present

## 2019-11-26 DIAGNOSIS — Z79899 Other long term (current) drug therapy: Secondary | ICD-10-CM

## 2019-11-26 DIAGNOSIS — J449 Chronic obstructive pulmonary disease, unspecified: Secondary | ICD-10-CM | POA: Diagnosis not present

## 2019-11-26 DIAGNOSIS — Z7951 Long term (current) use of inhaled steroids: Secondary | ICD-10-CM

## 2019-11-26 DIAGNOSIS — S20219A Contusion of unspecified front wall of thorax, initial encounter: Secondary | ICD-10-CM

## 2019-11-26 LAB — BASIC METABOLIC PANEL
Anion gap: 13 (ref 5–15)
BUN: 36 mg/dL — ABNORMAL HIGH (ref 8–23)
CO2: 21 mmol/L — ABNORMAL LOW (ref 22–32)
Calcium: 8.6 mg/dL — ABNORMAL LOW (ref 8.9–10.3)
Chloride: 103 mmol/L (ref 98–111)
Creatinine, Ser: 1.54 mg/dL — ABNORMAL HIGH (ref 0.61–1.24)
GFR calc Af Amer: 48 mL/min — ABNORMAL LOW (ref >60)
GFR calc non Af Amer: 41 mL/min — ABNORMAL LOW (ref >60)
Glucose, Bld: 223 mg/dL — ABNORMAL HIGH (ref 70–99)
Potassium: 5.4 mmol/L — ABNORMAL HIGH (ref 3.5–5.1)
Sodium: 137 mmol/L (ref 135–145)

## 2019-11-26 LAB — GLUCOSE, CAPILLARY: Glucose-Capillary: 302 mg/dL — ABNORMAL HIGH (ref 70–99)

## 2019-11-26 LAB — STREP PNEUMONIAE URINARY ANTIGEN: Strep Pneumo Urinary Antigen: NEGATIVE

## 2019-11-26 LAB — URINALYSIS, ROUTINE W REFLEX MICROSCOPIC
Bilirubin Urine: NEGATIVE
Glucose, UA: NEGATIVE mg/dL
Hgb urine dipstick: NEGATIVE
Ketones, ur: NEGATIVE mg/dL
Leukocytes,Ua: NEGATIVE
Nitrite: NEGATIVE
Protein, ur: NEGATIVE mg/dL
Specific Gravity, Urine: 1.009 (ref 1.005–1.030)
pH: 5 (ref 5.0–8.0)

## 2019-11-26 LAB — CBC
HCT: 40 % (ref 39.0–52.0)
Hemoglobin: 12.5 g/dL — ABNORMAL LOW (ref 13.0–17.0)
MCH: 30.3 pg (ref 26.0–34.0)
MCHC: 31.3 g/dL (ref 30.0–36.0)
MCV: 97.1 fL (ref 80.0–100.0)
Platelets: 189 10*3/uL (ref 150–400)
RBC: 4.12 MIL/uL — ABNORMAL LOW (ref 4.22–5.81)
RDW: 15.6 % — ABNORMAL HIGH (ref 11.5–15.5)
WBC: 13 10*3/uL — ABNORMAL HIGH (ref 4.0–10.5)
nRBC: 0 % (ref 0.0–0.2)

## 2019-11-26 LAB — HEPATIC FUNCTION PANEL
ALT: 15 U/L (ref 0–44)
AST: 21 U/L (ref 15–41)
Albumin: 2.9 g/dL — ABNORMAL LOW (ref 3.5–5.0)
Alkaline Phosphatase: 78 U/L (ref 38–126)
Bilirubin, Direct: 0.2 mg/dL (ref 0.0–0.2)
Indirect Bilirubin: 0.7 mg/dL (ref 0.3–0.9)
Total Bilirubin: 0.9 mg/dL (ref 0.3–1.2)
Total Protein: 6.4 g/dL — ABNORMAL LOW (ref 6.5–8.1)

## 2019-11-26 LAB — POC SARS CORONAVIRUS 2 AG -  ED: SARS Coronavirus 2 Ag: NEGATIVE

## 2019-11-26 LAB — TSH: TSH: 2.093 u[IU]/mL (ref 0.350–4.500)

## 2019-11-26 LAB — TROPONIN I (HIGH SENSITIVITY)
Troponin I (High Sensitivity): 41 ng/L — ABNORMAL HIGH (ref <18)
Troponin I (High Sensitivity): 42 ng/L — ABNORMAL HIGH (ref <18)

## 2019-11-26 LAB — HEMOGLOBIN A1C
Hgb A1c MFr Bld: 8.1 % — ABNORMAL HIGH (ref 4.8–5.6)
Mean Plasma Glucose: 185.77 mg/dL

## 2019-11-26 LAB — BRAIN NATRIURETIC PEPTIDE: B Natriuretic Peptide: 1739.3 pg/mL — ABNORMAL HIGH (ref 0.0–100.0)

## 2019-11-26 LAB — CBG MONITORING, ED: Glucose-Capillary: 315 mg/dL — ABNORMAL HIGH (ref 70–99)

## 2019-11-26 LAB — PROTIME-INR
INR: 3.8 — ABNORMAL HIGH (ref 0.8–1.2)
Prothrombin Time: 37.2 seconds — ABNORMAL HIGH (ref 11.4–15.2)

## 2019-11-26 LAB — PROCALCITONIN: Procalcitonin: 0.1 ng/mL

## 2019-11-26 LAB — LACTIC ACID, PLASMA
Lactic Acid, Venous: 1.6 mmol/L (ref 0.5–1.9)
Lactic Acid, Venous: 1.8 mmol/L (ref 0.5–1.9)

## 2019-11-26 MED ORDER — FUROSEMIDE 10 MG/ML IJ SOLN
40.0000 mg | Freq: Once | INTRAMUSCULAR | Status: AC
  Administered 2019-11-26: 40 mg via INTRAVENOUS
  Filled 2019-11-26: qty 4

## 2019-11-26 MED ORDER — AMIODARONE HCL 200 MG PO TABS
200.0000 mg | ORAL_TABLET | Freq: Every day | ORAL | Status: DC
  Administered 2019-11-27 – 2019-12-06 (×10): 200 mg via ORAL
  Filled 2019-11-26 (×10): qty 1

## 2019-11-26 MED ORDER — SODIUM CHLORIDE 0.9 % IV SOLN: 1.0000 g | INTRAVENOUS | Status: DC

## 2019-11-26 MED ORDER — GUAIFENESIN ER 600 MG PO TB12: 600.0000 mg | ORAL_TABLET | Freq: Two times a day (BID) | ORAL | Status: DC

## 2019-11-26 MED ORDER — MORPHINE SULFATE (PF) 2 MG/ML IV SOLN
2.0000 mg | INTRAVENOUS | Status: DC | PRN
  Administered 2019-11-26: 2 mg via INTRAVENOUS
  Filled 2019-11-26: qty 1

## 2019-11-26 MED ORDER — INSULIN ASPART 100 UNIT/ML ~~LOC~~ SOLN
0.0000 [IU] | Freq: Three times a day (TID) | SUBCUTANEOUS | Status: DC
  Administered 2019-11-26: 20:00:00 7 [IU] via SUBCUTANEOUS
  Administered 2019-11-27: 5 [IU] via SUBCUTANEOUS
  Administered 2019-11-28: 3 [IU] via SUBCUTANEOUS
  Administered 2019-11-28: 2 [IU] via SUBCUTANEOUS
  Administered 2019-11-28: 3 [IU] via SUBCUTANEOUS
  Administered 2019-11-29: 2 [IU] via SUBCUTANEOUS
  Administered 2019-11-29: 17:00:00 1 [IU] via SUBCUTANEOUS
  Administered 2019-11-30 – 2019-12-01 (×4): 2 [IU] via SUBCUTANEOUS
  Administered 2019-12-01: 5 [IU] via SUBCUTANEOUS
  Administered 2019-12-01: 07:00:00 1 [IU] via SUBCUTANEOUS
  Administered 2019-12-02: 3 [IU] via SUBCUTANEOUS
  Administered 2019-12-02: 5 [IU] via SUBCUTANEOUS
  Administered 2019-12-03: 1 [IU] via SUBCUTANEOUS
  Administered 2019-12-03: 2 [IU] via SUBCUTANEOUS
  Administered 2019-12-04: 07:00:00 1 [IU] via SUBCUTANEOUS
  Administered 2019-12-04: 3 [IU] via SUBCUTANEOUS
  Administered 2019-12-04: 5 [IU] via SUBCUTANEOUS
  Administered 2019-12-05: 3 [IU] via SUBCUTANEOUS
  Administered 2019-12-05: 1 [IU] via SUBCUTANEOUS
  Administered 2019-12-05: 2 [IU] via SUBCUTANEOUS

## 2019-11-26 MED ORDER — PREDNISONE 20 MG PO TABS: 40.0000 mg | ORAL_TABLET | Freq: Every day | ORAL | Status: DC

## 2019-11-26 MED ORDER — INSULIN DEGLUDEC-LIRAGLUTIDE 100-3.6 UNIT-MG/ML ~~LOC~~ SOPN: 14.0000 [IU] | PEN_INJECTOR | Freq: Every morning | SUBCUTANEOUS | Status: DC

## 2019-11-26 MED ORDER — GUAIFENESIN ER 600 MG PO TB12
1200.0000 mg | ORAL_TABLET | Freq: Two times a day (BID) | ORAL | Status: DC
  Administered 2019-11-26 – 2019-12-06 (×20): 1200 mg via ORAL
  Filled 2019-11-26 (×20): qty 2

## 2019-11-26 MED ORDER — ONDANSETRON HCL 4 MG/2ML IJ SOLN: 4.0000 mg | Freq: Four times a day (QID) | INTRAMUSCULAR | Status: DC | PRN

## 2019-11-26 MED ORDER — FERROUS SULFATE 325 (65 FE) MG PO TABS
325.0000 mg | ORAL_TABLET | Freq: Every evening | ORAL | Status: DC
  Administered 2019-11-27 – 2019-12-05 (×9): 325 mg via ORAL
  Filled 2019-11-26 (×9): qty 1

## 2019-11-26 MED ORDER — WARFARIN - PHARMACIST DOSING INPATIENT: Freq: Every day | Status: DC

## 2019-11-26 MED ORDER — SODIUM CHLORIDE 0.9 % IV SOLN
1.0000 g | Freq: Once | INTRAVENOUS | Status: AC
  Administered 2019-11-26: 1 g via INTRAVENOUS
  Filled 2019-11-26: qty 10

## 2019-11-26 MED ORDER — UMECLIDINIUM BROMIDE 62.5 MCG/INH IN AEPB
1.0000 | INHALATION_SPRAY | Freq: Every day | RESPIRATORY_TRACT | Status: DC
  Administered 2019-11-27 – 2019-11-28 (×2): 1 via RESPIRATORY_TRACT
  Filled 2019-11-26: qty 7

## 2019-11-26 MED ORDER — FLUTICASONE PROPIONATE 50 MCG/ACT NA SUSP
2.0000 | Freq: Every day | NASAL | Status: DC
  Administered 2019-11-27 – 2019-12-06 (×10): 2 via NASAL
  Filled 2019-11-26 (×3): qty 16

## 2019-11-26 MED ORDER — LEVOTHYROXINE SODIUM 100 MCG PO TABS
100.0000 ug | ORAL_TABLET | Freq: Every day | ORAL | Status: DC
  Administered 2019-11-27 – 2019-12-06 (×10): 100 ug via ORAL
  Filled 2019-11-26 (×10): qty 1

## 2019-11-26 MED ORDER — INSULIN GLARGINE 100 UNIT/ML ~~LOC~~ SOLN
14.0000 [IU] | Freq: Every day | SUBCUTANEOUS | Status: DC
  Administered 2019-11-27 – 2019-12-06 (×10): 14 [IU] via SUBCUTANEOUS
  Filled 2019-11-26 (×10): qty 0.14

## 2019-11-26 MED ORDER — CARVEDILOL 3.125 MG PO TABS
3.1250 mg | ORAL_TABLET | Freq: Two times a day (BID) | ORAL | Status: DC
  Administered 2019-11-27 – 2019-12-02 (×12): 3.125 mg via ORAL
  Filled 2019-11-26 (×15): qty 1

## 2019-11-26 MED ORDER — HYDROCODONE-ACETAMINOPHEN 5-325 MG PO TABS
1.0000 | ORAL_TABLET | Freq: Four times a day (QID) | ORAL | Status: DC | PRN
  Administered 2019-11-27 – 2019-12-05 (×5): 1 via ORAL
  Filled 2019-11-26 (×5): qty 1

## 2019-11-26 MED ORDER — ONDANSETRON HCL 4 MG PO TABS: 4.0000 mg | ORAL_TABLET | Freq: Four times a day (QID) | ORAL | Status: DC | PRN

## 2019-11-26 MED ORDER — HYDROCORTISONE 1 % EX CREA
1.0000 "application " | TOPICAL_CREAM | Freq: Two times a day (BID) | CUTANEOUS | Status: DC
  Administered 2019-11-27 – 2019-12-06 (×20): 1 via TOPICAL
  Filled 2019-11-26 (×3): qty 28

## 2019-11-26 MED ORDER — BISACODYL 5 MG PO TBEC
10.0000 mg | DELAYED_RELEASE_TABLET | Freq: Every day | ORAL | Status: DC | PRN
  Administered 2019-11-28 – 2019-11-29 (×2): 10 mg via ORAL
  Filled 2019-11-26 (×2): qty 2

## 2019-11-26 MED ORDER — FLUTICASONE FUROATE-VILANTEROL 100-25 MCG/INH IN AEPB
1.0000 | INHALATION_SPRAY | Freq: Every day | RESPIRATORY_TRACT | Status: DC
  Administered 2019-11-27 – 2019-11-28 (×2): 1 via RESPIRATORY_TRACT
  Filled 2019-11-26: qty 28

## 2019-11-26 MED ORDER — ATORVASTATIN CALCIUM 80 MG PO TABS
80.0000 mg | ORAL_TABLET | Freq: Every day | ORAL | Status: DC
  Administered 2019-11-27 – 2019-12-06 (×10): 80 mg via ORAL
  Filled 2019-11-26 (×10): qty 1

## 2019-11-26 MED ORDER — ASPIRIN EC 81 MG PO TBEC
324.0000 mg | DELAYED_RELEASE_TABLET | Freq: Every day | ORAL | Status: DC
  Administered 2019-11-27 – 2019-12-06 (×10): 324 mg via ORAL
  Filled 2019-11-26 (×10): qty 4

## 2019-11-26 MED ORDER — FLUTICASONE-UMECLIDIN-VILANT 100-62.5-25 MCG/INH IN AEPB: 1.0000 | INHALATION_SPRAY | Freq: Every day | RESPIRATORY_TRACT | Status: DC

## 2019-11-26 MED ORDER — AZITHROMYCIN 500 MG PO TABS: 500.0000 mg | ORAL_TABLET | Freq: Every day | ORAL | Status: DC

## 2019-11-26 MED ORDER — MORPHINE SULFATE (PF) 2 MG/ML IV SOLN
2.0000 mg | INTRAVENOUS | Status: DC | PRN
  Administered 2019-11-26 – 2019-11-27 (×2): 2 mg via INTRAVENOUS
  Filled 2019-11-26 (×2): qty 1

## 2019-11-26 MED ORDER — ACETAMINOPHEN 325 MG PO TABS: 650.0000 mg | ORAL_TABLET | Freq: Four times a day (QID) | ORAL | Status: DC | PRN

## 2019-11-26 MED ORDER — ALBUTEROL SULFATE (2.5 MG/3ML) 0.083% IN NEBU
2.5000 mg | INHALATION_SOLUTION | Freq: Four times a day (QID) | RESPIRATORY_TRACT | Status: DC
  Administered 2019-11-26: 2.5 mg via RESPIRATORY_TRACT
  Filled 2019-11-26: qty 3

## 2019-11-26 MED ORDER — SODIUM CHLORIDE 0.9 % IV SOLN
500.0000 mg | Freq: Once | INTRAVENOUS | Status: AC
  Administered 2019-11-26: 500 mg via INTRAVENOUS
  Filled 2019-11-26: qty 500

## 2019-11-26 MED ORDER — SODIUM CHLORIDE 0.9% FLUSH
3.0000 mL | Freq: Two times a day (BID) | INTRAVENOUS | Status: DC
  Administered 2019-11-26 – 2019-12-05 (×18): 3 mL via INTRAVENOUS

## 2019-11-26 MED ORDER — ACETAMINOPHEN 650 MG RE SUPP: 650.0000 mg | Freq: Four times a day (QID) | RECTAL | Status: DC | PRN

## 2019-11-26 MED ORDER — ALBUTEROL SULFATE HFA 108 (90 BASE) MCG/ACT IN AERS
4.0000 | INHALATION_SPRAY | RESPIRATORY_TRACT | Status: DC | PRN
  Administered 2019-11-26: 15:00:00 4 via RESPIRATORY_TRACT
  Filled 2019-11-26: qty 6.7

## 2019-11-26 NOTE — Progress Notes (Signed)
Pt refusing cpap tonight,  RT will continue to monitor as needed
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
## 2019-11-26 NOTE — ED Notes (Signed)
Pt removed from O2 per MD.  Denied increased sob.  Sats remained in upper 90's and rr 22

## 2019-11-26 NOTE — ED Notes (Signed)
[phone] -betty wife also care giver . Wants an update.
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
## 2019-11-26 NOTE — ED Notes (Signed)
Dr. Tamala Julian paged to [place], RN paged by Levada Dy

## 2019-11-26 NOTE — ED Notes (Signed)
Dinner tray ordered 7:06 
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
## 2019-11-27 ENCOUNTER — Inpatient Hospital Stay (HOSPITAL_COMMUNITY): Payer: HMO

## 2019-11-27 ENCOUNTER — Encounter (HOSPITAL_COMMUNITY): Payer: Self-pay | Admitting: Internal Medicine

## 2019-11-27 DIAGNOSIS — I361 Nonrheumatic tricuspid (valve) insufficiency: Secondary | ICD-10-CM

## 2019-11-27 DIAGNOSIS — E875 Hyperkalemia: Secondary | ICD-10-CM | POA: Diagnosis present

## 2019-11-27 DIAGNOSIS — J449 Chronic obstructive pulmonary disease, unspecified: Secondary | ICD-10-CM

## 2019-11-27 DIAGNOSIS — Z9889 Other specified postprocedural states: Secondary | ICD-10-CM

## 2019-11-27 DIAGNOSIS — I10 Essential (primary) hypertension: Secondary | ICD-10-CM

## 2019-11-27 DIAGNOSIS — Z87828 Personal history of other (healed) physical injury and trauma: Secondary | ICD-10-CM

## 2019-11-27 DIAGNOSIS — I255 Ischemic cardiomyopathy: Secondary | ICD-10-CM

## 2019-11-27 DIAGNOSIS — Z87891 Personal history of nicotine dependence: Secondary | ICD-10-CM

## 2019-11-27 DIAGNOSIS — M25512 Pain in left shoulder: Secondary | ICD-10-CM

## 2019-11-27 DIAGNOSIS — T827XXD Infection and inflammatory reaction due to other cardiac and vascular devices, implants and grafts, subsequent encounter: Secondary | ICD-10-CM

## 2019-11-27 DIAGNOSIS — B9561 Methicillin susceptible Staphylococcus aureus infection as the cause of diseases classified elsewhere: Secondary | ICD-10-CM

## 2019-11-27 DIAGNOSIS — I4891 Unspecified atrial fibrillation: Secondary | ICD-10-CM

## 2019-11-27 DIAGNOSIS — I340 Nonrheumatic mitral (valve) insufficiency: Secondary | ICD-10-CM

## 2019-11-27 DIAGNOSIS — E039 Hypothyroidism, unspecified: Secondary | ICD-10-CM

## 2019-11-27 DIAGNOSIS — Z888 Allergy status to other drugs, medicaments and biological substances status: Secondary | ICD-10-CM

## 2019-11-27 DIAGNOSIS — Z95810 Presence of automatic (implantable) cardiac defibrillator: Secondary | ICD-10-CM

## 2019-11-27 DIAGNOSIS — T827XXA Infection and inflammatory reaction due to other cardiac and vascular devices, implants and grafts, initial encounter: Secondary | ICD-10-CM

## 2019-11-27 DIAGNOSIS — I5023 Acute on chronic systolic (congestive) heart failure: Secondary | ICD-10-CM

## 2019-11-27 DIAGNOSIS — R778 Other specified abnormalities of plasma proteins: Secondary | ICD-10-CM | POA: Diagnosis present

## 2019-11-27 DIAGNOSIS — E785 Hyperlipidemia, unspecified: Secondary | ICD-10-CM

## 2019-11-27 DIAGNOSIS — J9621 Acute and chronic respiratory failure with hypoxia: Secondary | ICD-10-CM

## 2019-11-27 DIAGNOSIS — R7881 Bacteremia: Secondary | ICD-10-CM

## 2019-11-27 DIAGNOSIS — Z91048 Other nonmedicinal substance allergy status: Secondary | ICD-10-CM

## 2019-11-27 HISTORY — DX: Methicillin susceptible Staphylococcus aureus infection as the cause of diseases classified elsewhere: B95.61

## 2019-11-27 HISTORY — DX: Bacteremia: R78.81

## 2019-11-27 LAB — BLOOD CULTURE ID PANEL (REFLEXED)

## 2019-11-27 LAB — BODY FLUID CELL COUNT WITH DIFFERENTIAL
Eos, Fluid: 0 %
Lymphs, Fluid: 50 %
Monocyte-Macrophage-Serous Fluid: 29 % — ABNORMAL LOW (ref 50–90)
Neutrophil Count, Fluid: 21 % (ref 0–25)
Total Nucleated Cell Count, Fluid: 198 cu mm (ref 0–1000)

## 2019-11-27 LAB — CBC
HCT: 38.2 % — ABNORMAL LOW (ref 39.0–52.0)
Hemoglobin: 12.3 g/dL — ABNORMAL LOW (ref 13.0–17.0)
MCH: 29.9 pg (ref 26.0–34.0)
MCHC: 32.2 g/dL (ref 30.0–36.0)
MCV: 92.9 fL (ref 80.0–100.0)
Platelets: 195 10*3/uL (ref 150–400)
RBC: 4.11 MIL/uL — ABNORMAL LOW (ref 4.22–5.81)
RDW: 15.4 % (ref 11.5–15.5)
WBC: 9.7 10*3/uL (ref 4.0–10.5)
nRBC: 0 % (ref 0.0–0.2)

## 2019-11-27 LAB — BASIC METABOLIC PANEL
Anion gap: 12 (ref 5–15)
BUN: 34 mg/dL — ABNORMAL HIGH (ref 8–23)
CO2: 27 mmol/L (ref 22–32)
Calcium: 8.9 mg/dL (ref 8.9–10.3)
Chloride: 101 mmol/L (ref 98–111)
Creatinine, Ser: 1.32 mg/dL — ABNORMAL HIGH (ref 0.61–1.24)
GFR calc Af Amer: 57 mL/min — ABNORMAL LOW (ref >60)
GFR calc non Af Amer: 49 mL/min — ABNORMAL LOW (ref >60)
Glucose, Bld: 180 mg/dL — ABNORMAL HIGH (ref 70–99)
Potassium: 4.4 mmol/L (ref 3.5–5.1)
Sodium: 140 mmol/L (ref 135–145)

## 2019-11-27 LAB — PROTIME-INR
INR: 4.4 (ref 0.8–1.2)
Prothrombin Time: 42 seconds — ABNORMAL HIGH (ref 11.4–15.2)

## 2019-11-27 LAB — GLUCOSE, CAPILLARY
Glucose-Capillary: 162 mg/dL — ABNORMAL HIGH (ref 70–99)
Glucose-Capillary: 185 mg/dL — ABNORMAL HIGH (ref 70–99)
Glucose-Capillary: 296 mg/dL — ABNORMAL HIGH (ref 70–99)
Glucose-Capillary: 215 mg/dL — ABNORMAL HIGH (ref 70–99)

## 2019-11-27 LAB — ECHOCARDIOGRAM COMPLETE
Height: 68 in
Weight: 3015.89 oz

## 2019-11-27 LAB — LACTATE DEHYDROGENASE, PLEURAL OR PERITONEAL FLUID: LD, Fluid: 93 U/L — ABNORMAL HIGH (ref 3–23)

## 2019-11-27 LAB — PROTEIN, PLEURAL OR PERITONEAL FLUID: Total protein, fluid: <3 g/dL

## 2019-11-27 LAB — MAGNESIUM: Magnesium: 1.9 mg/dL (ref 1.7–2.4)

## 2019-11-27 LAB — SARS CORONAVIRUS 2 (TAT 6-24 HRS): SARS Coronavirus 2: NEGATIVE

## 2019-11-27 MED ORDER — ALBUTEROL SULFATE (2.5 MG/3ML) 0.083% IN NEBU
2.5000 mg | INHALATION_SOLUTION | Freq: Three times a day (TID) | RESPIRATORY_TRACT | Status: DC
  Administered 2019-11-27 – 2019-11-28 (×4): 2.5 mg via RESPIRATORY_TRACT
  Filled 2019-11-27 (×4): qty 3

## 2019-11-27 MED ORDER — PERFLUTREN LIPID MICROSPHERE
1.0000 mL | INTRAVENOUS | Status: AC | PRN
  Administered 2019-11-27: 3 mL via INTRAVENOUS
  Filled 2019-11-27: qty 10

## 2019-11-27 MED ORDER — FUROSEMIDE 10 MG/ML IJ SOLN
40.0000 mg | Freq: Four times a day (QID) | INTRAMUSCULAR | Status: AC
  Administered 2019-11-27 (×2): 40 mg via INTRAVENOUS
  Filled 2019-11-27 (×2): qty 4

## 2019-11-27 MED ORDER — VITAMIN K1 10 MG/ML IJ SOLN
5.0000 mg | Freq: Once | INTRAVENOUS | Status: AC
  Administered 2019-11-27: 10:00:00 5 mg via INTRAVENOUS
  Filled 2019-11-27: qty 0.5

## 2019-11-27 MED ORDER — CEFAZOLIN SODIUM-DEXTROSE 2-4 GM/100ML-% IV SOLN
2.0000 g | Freq: Three times a day (TID) | INTRAVENOUS | Status: DC
  Administered 2019-11-27 – 2019-12-06 (×27): 2 g via INTRAVENOUS
  Filled 2019-11-27 (×31): qty 100

## 2019-11-27 NOTE — Significant Event (Signed)
Rapid Response Event Note  Overview: Bedside Thoracentesis   Called to bedside to monitor for bedside thoracentesis. Informed Consent was obtained, Time Out performed, patient was already on telemetry, continuous pulse oximetry was applied. Saturations remained > 96% on 3L [place] pre-procedure, during, and after procedure. Tolerated well, BP remained stable. Pending CXR. Fluid to be sent down to lab.   Start Time 1121 End Time 1155   [name] R
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
## 2019-11-27 NOTE — Progress Notes (Addendum)
ANTICOAGULATION CONSULT NOTE - Follow-Up Consult  Pharmacy Consult for Warfarin Indication: atrial fibrillation  Allergies  Allergen Reactions  . Adhesive [Tape] Other (See Comments)    PATIENT'S SKIN TEARS AND BRUISES VERY EASILY- IS TAKING COUMADIN!!  . Aldactone [Spironolactone] Other (See Comments)    Hyperkalemia  reported by Dr. [name] - pt is currently taking 12.5 mg daily -November 2017 per medication list by same MD  . Losartan Other (See Comments)    "Landed the patient in the hospital"- AFFECTED KIDNEY FUNCTION    Patient Measurements: Height: 5\' 8"  (172.7 cm) Weight: 188 lb 7.9 oz (85.5 kg) IBW/kg (Calculated) : 68.4  Vital Signs: Temp: 98.1 F (36.7 C) (02/02 1152) Temp Source: Oral (02/02 1152) BP: 120/60 (02/02 1400) Pulse Rate: 75 (02/02 1152)  Labs: Recent Labs    11/26/19 1057 11/26/19 1210 11/26/19 1214 11/27/19 0405  HGB 12.5*  --   --  12.3*  HCT 40.0  --   --  38.2*  PLT 189  --   --  195  LABPROT  --  37.2*  --  42.0*  INR  --  3.8*  --  4.4*  CREATININE 1.54*  --   --  1.32*  TROPONINIHS 41*  --  42*  --     Estimated Creatinine Clearance: 44.3 mL/min (A) (by C-G formula based on SCr of 1.32 mg/dL (H)).   Medical History: Past Medical History:  Diagnosis Date  . Atrial fibrillation ([place])    persistent, previously seen at [facility] and placed on amiodarone  . Benign prostatic hypertrophy   . CAD (coronary artery disease)    multivessel s/p inferolateral wall MI with subsequent CABG 11/1998.  Cath 2009 with Patent grafts  . Cleft palate   . COPD with emphysema ([place]) 04/01/2010  . DM (diabetes mellitus), type 2 ([place])   . Dyspnea   . GERD (gastroesophageal reflux disease)   . HTN (hypertension)   . Hyperlipidemia   . Hypothyroidism   . Iron deficiency anemia   . Ischemic dilated cardiomyopathy ([place])    EF 35-40% by MUGA 6/11  . MSSA bacteremia 11/27/2019  . Myocardial infarction ([place])   . Nasal septal deviation   . Nephrolithiasis    . OSA (obstructive sleep apnea)   . PAF (paroxysmal atrial fibrillation) ([place])   . Peripheral arterial disease ([facility])    left subclavian artery stenosis  . PNA (pneumonia)   . Psoriasis   . Seborrheic keratosis   . Stroke ([place])   . Systolic congestive heart failure ([place]) 2009   s/p BiV ICD implantation by Dr Leonia Reeves (MDT)   Assessment: 39 YOM presenting with SOB, on warfarin PTA for AFib to be continued.  INR on admission supratherapeutic with last dose taken on 1/31.  PTA dosing: 5mg  daily except 2.5mg  Mon/Fri  INR remains supratherapeutic again at 4.4 after holding yesterday's dose. CBC remains stable. Now s/p thoracentesis, was given vitamin K 5 mg IV once this morning. No overt bleeding noted.  Goal of Therapy:  INR 2-3 Monitor platelets by anticoagulation protocol: Yes   Plan:  Hold warfarin tonight Daily INR, s/s bleeding  Richardine Service, PharmD PGY1 Pharmacy Resident Phone: [phone] 11/27/2019  6:16 PM  Please check [email or web address] for unit-specific pharmacy phone numbers.

## 2019-11-27 NOTE — Progress Notes (Addendum)
PHARMACY - PHYSICIAN COMMUNICATION CRITICAL VALUE ALERT - BLOOD CULTURE IDENTIFICATION (BCID)  [name] is an 86 y.o. male who presented to [facility] on [facility]/26/2019 with a chief complaint of shortness of breath  Assessment:  WBC down some this AM, afebrile  Name of physician (or Provider) Contacted: Ardith Dark ([facility])  Current antibiotics: Ceftriaxone, Azithromycin   Changes to prescribed antibiotics recommended:  -Will change Ceftriaxone to Cefazolin 2g IV q8h -Will PO Azithromycin on for now with possible overlying COPD exacerbation-can probably DC later today if pt looks OK clinically-PCT is 0.1  Results for orders placed or performed during the hospital encounter of 11/26/19  Blood Culture ID Panel (Reflexed) (Collected: 11/26/2019  1:05 PM)  Result Value Ref Range   Enterococcus species NOT DETECTED NOT DETECTED   Listeria monocytogenes NOT DETECTED NOT DETECTED   Staphylococcus species DETECTED (A) NOT DETECTED   Staphylococcus aureus (BCID) DETECTED (A) NOT DETECTED   Methicillin resistance NOT DETECTED NOT DETECTED   Streptococcus species NOT DETECTED NOT DETECTED   Streptococcus agalactiae NOT DETECTED NOT DETECTED   Streptococcus pneumoniae NOT DETECTED NOT DETECTED   Streptococcus pyogenes NOT DETECTED NOT DETECTED   Acinetobacter baumannii NOT DETECTED NOT DETECTED   Enterobacteriaceae species NOT DETECTED NOT DETECTED   Enterobacter cloacae complex NOT DETECTED NOT DETECTED   Escherichia coli NOT DETECTED NOT DETECTED   Klebsiella oxytoca NOT DETECTED NOT DETECTED   Klebsiella pneumoniae NOT DETECTED NOT DETECTED   Proteus species NOT DETECTED NOT DETECTED   Serratia marcescens NOT DETECTED NOT DETECTED   Haemophilus influenzae NOT DETECTED NOT DETECTED   Neisseria meningitidis NOT DETECTED NOT DETECTED   Pseudomonas aeruginosa NOT DETECTED NOT DETECTED   Candida albicans NOT DETECTED NOT DETECTED   Candida glabrata NOT DETECTED NOT DETECTED   Candida krusei  NOT DETECTED NOT DETECTED   Candida parapsilosis NOT DETECTED NOT DETECTED   Candida tropicalis NOT DETECTED NOT DETECTED   Narda Bonds, PharmD, BCPS Clinical Pharmacist Phone: [phone]

## 2019-11-27 NOTE — Procedures (Signed)
Thoracentesis Procedure Note  Pre-operative Diagnosis: Pleural effusion  Post-operative Diagnosis: same  Indications: Relieve dyspnea, identify potential parapneumonic effusion  Procedure Details  Consent: Informed consent was obtained. Risks of the procedure were discussed including: infection, bleeding, pain, pneumothorax.  Under sterile conditions the patient was positioned. [place] used to identify pocket of fluid.  Chlorhexidine used to clean area over fluid. Fluid was obtained without any difficulties and minimal blood loss.  A dressing was applied to the wound and wound care instructions were provided.   Findings 1250 ml of straw pleural fluid was obtained. A sample was sent to Pathology for cytogenetics, flow, and cell counts, as well as for infection analysis.  Complications:  None; patient tolerated the procedure well.          Condition: stable

## 2019-11-27 NOTE — Progress Notes (Addendum)
Patient is unable to use his DPI's appropriately  Spoke with Dr.Jospeh
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
## 2019-11-28 DIAGNOSIS — X088XXA Exposure to other specified smoke, fire and flames, initial encounter: Secondary | ICD-10-CM

## 2019-11-28 DIAGNOSIS — H9190 Unspecified hearing loss, unspecified ear: Secondary | ICD-10-CM

## 2019-11-28 DIAGNOSIS — W2210XA Striking against or struck by unspecified automobile airbag, initial encounter: Secondary | ICD-10-CM

## 2019-11-28 DIAGNOSIS — R0902 Hypoxemia: Secondary | ICD-10-CM

## 2019-11-28 DIAGNOSIS — Z9889 Other specified postprocedural states: Secondary | ICD-10-CM

## 2019-11-28 DIAGNOSIS — R079 Chest pain, unspecified: Secondary | ICD-10-CM

## 2019-11-28 DIAGNOSIS — J90 Pleural effusion, not elsewhere classified: Secondary | ICD-10-CM

## 2019-11-28 DIAGNOSIS — S20219A Contusion of unspecified front wall of thorax, initial encounter: Secondary | ICD-10-CM

## 2019-11-28 DIAGNOSIS — M8448XD Pathological fracture, other site, subsequent encounter for fracture with routine healing: Secondary | ICD-10-CM

## 2019-11-28 DIAGNOSIS — T22052A Burn of unspecified degree of left shoulder, initial encounter: Secondary | ICD-10-CM

## 2019-11-28 DIAGNOSIS — S20219D Contusion of unspecified front wall of thorax, subsequent encounter: Secondary | ICD-10-CM

## 2019-11-28 DIAGNOSIS — G4733 Obstructive sleep apnea (adult) (pediatric): Secondary | ICD-10-CM

## 2019-11-28 LAB — GLUCOSE, CAPILLARY
Glucose-Capillary: 162 mg/dL — ABNORMAL HIGH (ref 70–99)
Glucose-Capillary: 208 mg/dL — ABNORMAL HIGH (ref 70–99)
Glucose-Capillary: 237 mg/dL — ABNORMAL HIGH (ref 70–99)
Glucose-Capillary: 186 mg/dL — ABNORMAL HIGH (ref 70–99)

## 2019-11-28 LAB — CBC
HCT: 36.9 % — ABNORMAL LOW (ref 39.0–52.0)
Hemoglobin: 12 g/dL — ABNORMAL LOW (ref 13.0–17.0)
MCH: 30.5 pg (ref 26.0–34.0)
MCHC: 32.5 g/dL (ref 30.0–36.0)
MCV: 93.7 fL (ref 80.0–100.0)
Platelets: 180 10*3/uL (ref 150–400)
RBC: 3.94 MIL/uL — ABNORMAL LOW (ref 4.22–5.81)
RDW: 15.1 % (ref 11.5–15.5)
WBC: 8.8 10*3/uL (ref 4.0–10.5)
nRBC: 0 % (ref 0.0–0.2)

## 2019-11-28 LAB — BASIC METABOLIC PANEL
Anion gap: 12 (ref 5–15)
BUN: 37 mg/dL — ABNORMAL HIGH (ref 8–23)
CO2: 27 mmol/L (ref 22–32)
Calcium: 8.4 mg/dL — ABNORMAL LOW (ref 8.9–10.3)
Chloride: 98 mmol/L (ref 98–111)
Creatinine, Ser: 1.43 mg/dL — ABNORMAL HIGH (ref 0.61–1.24)
GFR calc Af Amer: 52 mL/min — ABNORMAL LOW (ref >60)
GFR calc non Af Amer: 45 mL/min — ABNORMAL LOW (ref >60)
Glucose, Bld: 170 mg/dL — ABNORMAL HIGH (ref 70–99)
Potassium: 3.8 mmol/L (ref 3.5–5.1)
Sodium: 137 mmol/L (ref 135–145)

## 2019-11-28 LAB — PROTIME-INR
INR: 1.3 — ABNORMAL HIGH (ref 0.8–1.2)
Prothrombin Time: 16.4 seconds — ABNORMAL HIGH (ref 11.4–15.2)

## 2019-11-28 LAB — LEGIONELLA PNEUMOPHILA SEROGP 1 UR AG: L. pneumophila Serogp 1 Ur Ag: NEGATIVE

## 2019-11-28 LAB — CYTOLOGY - NON PAP

## 2019-11-28 LAB — LACTATE DEHYDROGENASE: LDH: 187 U/L (ref 98–192)

## 2019-11-28 LAB — PATHOLOGIST SMEAR REVIEW

## 2019-11-28 MED ORDER — ARFORMOTEROL TARTRATE 15 MCG/2ML IN NEBU
15.0000 ug | INHALATION_SOLUTION | Freq: Two times a day (BID) | RESPIRATORY_TRACT | Status: DC
  Administered 2019-11-28 – 2019-12-02 (×8): 15 ug via RESPIRATORY_TRACT
  Administered 2019-12-02: 21:00:00 7.5 ug via RESPIRATORY_TRACT
  Administered 2019-12-03 – 2019-12-06 (×7): 15 ug via RESPIRATORY_TRACT
  Filled 2019-11-28 (×16): qty 2

## 2019-11-28 MED ORDER — FUROSEMIDE 10 MG/ML IJ SOLN
40.0000 mg | Freq: Every day | INTRAMUSCULAR | Status: DC
  Administered 2019-11-28: 40 mg via INTRAVENOUS
  Filled 2019-11-28: qty 4

## 2019-11-28 MED ORDER — FUROSEMIDE 10 MG/ML IJ SOLN
20.0000 mg | Freq: Two times a day (BID) | INTRAMUSCULAR | Status: DC
  Administered 2019-11-28 – 2019-11-30 (×4): 20 mg via INTRAVENOUS
  Filled 2019-11-28 (×4): qty 2

## 2019-11-28 MED ORDER — POTASSIUM CHLORIDE CRYS ER 20 MEQ PO TBCR
40.0000 meq | EXTENDED_RELEASE_TABLET | Freq: Every day | ORAL | Status: DC
  Administered 2019-11-28 – 2019-11-30 (×3): 40 meq via ORAL
  Filled 2019-11-28 (×3): qty 2

## 2019-11-28 MED ORDER — WARFARIN SODIUM 5 MG PO TABS
5.0000 mg | ORAL_TABLET | Freq: Once | ORAL | Status: AC
  Administered 2019-11-28: 5 mg via ORAL
  Filled 2019-11-28: qty 1

## 2019-11-28 MED ORDER — BUDESONIDE 0.25 MG/2ML IN SUSP
0.2500 mg | Freq: Two times a day (BID) | RESPIRATORY_TRACT | Status: DC
  Administered 2019-11-28 – 2019-12-06 (×16): 0.25 mg via RESPIRATORY_TRACT
  Filled 2019-11-28 (×16): qty 2

## 2019-11-28 MED ORDER — IPRATROPIUM-ALBUTEROL 0.5-2.5 (3) MG/3ML IN SOLN: 3.0000 mL | RESPIRATORY_TRACT | Status: DC | PRN

## 2019-11-28 NOTE — Progress Notes (Signed)
ANTICOAGULATION CONSULT NOTE - Follow-Up Consult  Pharmacy Consult for Warfarin Indication: atrial fibrillation  Allergies  Allergen Reactions  . Adhesive [Tape] Other (See Comments)    PATIENT'S SKIN TEARS AND BRUISES VERY EASILY- IS TAKING COUMADIN!!  . Aldactone [Spironolactone] Other (See Comments)    Hyperkalemia  reported by Dr. [name] - pt is currently taking 12.5 mg daily -November 2017 per medication list by same MD  . Losartan Other (See Comments)    "Landed the patient in the hospital"- AFFECTED KIDNEY FUNCTION    Patient Measurements: Height: 5\' 8"  (172.7 cm) Weight: 185 lb 3 oz (84 kg) IBW/kg (Calculated) : 68.4  Vital Signs: Temp: 98.1 F (36.7 C) (02/03 0403) Temp Source: Oral (02/03 0403) BP: 145/76 (02/03 0403) Pulse Rate: 77 (02/03 0403)  Labs: Recent Labs    11/26/19 1057 11/26/19 1057 11/26/19 1210 11/26/19 1214 11/27/19 0405 11/28/19 0516  HGB 12.5*   < >  --   --  12.3* 12.0*  HCT 40.0  --   --   --  38.2* 36.9*  PLT 189  --   --   --  195 180  LABPROT  --   --  37.2*  --  42.0* 16.4*  INR  --   --  3.8*  --  4.4* 1.3*  CREATININE 1.54*  --   --   --  1.32* 1.43*  TROPONINIHS 41*  --   --  42*  --   --    < > = values in this interval not displayed.    Estimated Creatinine Clearance: 40.6 mL/min (A) (by C-G formula based on SCr of 1.43 mg/dL (H)).   Medical History: Past Medical History:  Diagnosis Date  . Atrial fibrillation ([place])    persistent, previously seen at [facility] and placed on amiodarone  . Benign prostatic hypertrophy   . CAD (coronary artery disease)    multivessel s/p inferolateral wall MI with subsequent CABG 11/1998.  Cath 2009 with Patent grafts  . Cleft palate   . COPD with emphysema ([place]) 04/01/2010  . DM (diabetes mellitus), type 2 ([place])   . Dyspnea   . GERD (gastroesophageal reflux disease)   . HTN (hypertension)   . Hyperlipidemia   . Hypothyroidism   . Iron deficiency anemia   . Ischemic dilated  cardiomyopathy ([place])    EF 35-40% by MUGA 6/11  . MSSA bacteremia 11/27/2019  . Myocardial infarction ([place])   . Nasal septal deviation   . Nephrolithiasis   . OSA (obstructive sleep apnea)   . PAF (paroxysmal atrial fibrillation) ([place])   . Peripheral arterial disease ([facility])    left subclavian artery stenosis  . PNA (pneumonia)   . Psoriasis   . Seborrheic keratosis   . Stroke ([place])   . Systolic congestive heart failure ([place]) 2009   s/p BiV ICD implantation by Dr Leonia Reeves (MDT)   Assessment: 52 YOM presenting with SOB, on warfarin PTA for AFib. Pharmacy consulted to dose warfarin inpatient. INR on admission supratherapeutic at 3.8 with last dose taken on 1/31. PTA dosing: 5mg  daily except 2.5mg  Mon/Fri  INR back down 4.4>>1.3 after doses held 2/1 and 2/2 and given vitamin K 5mg  IV x 1 on 2/2 prior to thoracentesis. CBC remains stable. No active bleed issues documented. Patient continues on PTA amiodarone.  Goal of Therapy:  INR 2-3 Monitor platelets by anticoagulation protocol: Yes   Plan:  Resume warfarin 5mg  PO x 1 dose tonight Monitor daily INR, CBC, s/sx bleeding  Elicia Lamp, PharmD, BCPS Please check AMION for all [facility] contact numbers Clinical Pharmacist 11/28/2019 9:54 AM

## 2019-11-28 NOTE — Progress Notes (Signed)
Pt refused CPAP last night, pt educated. No complications while asleep, O2 sats 100% on nasal cannula. Monitoring.

## 2019-11-28 NOTE — Progress Notes (Signed)
Patient woke up confused screaming "help me". Confused, oriented x1 but able to be reoriented to x2. Vital signs stable. MD notified.

## 2019-11-28 NOTE — Progress Notes (Signed)
Patient has flutter within reach at bedside. Patient states he uses flutter valve every hour without assistance

## 2019-11-28 NOTE — Progress Notes (Signed)
    [facility] has been requested to perform a transesophageal echocardiogram on 11/29/2019 for bacteremia and PPM lead evaluation.  After careful review of history and examination, the risks and benefits of transesophageal echocardiogram have been explained including risks of esophageal damage, perforation (1:10,000 risk), bleeding, pharyngeal hematoma as well as other potential complications associated with conscious sedation including aspiration, arrhythmia, respiratory failure and death. Alternatives to treatment were discussed, questions were answered. Patient is willing to proceed.   TEE scheduled for 11/29/2019 at 1:30pm with Dr. Harrington Challenger.  Roby Lofts, PA-C 11/28/2019 2:37 PM
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
## 2019-11-28 NOTE — Progress Notes (Signed)
CPAP not placed on patient at this time.  Patient is confused at this time RN at bedside.

## 2019-11-28 NOTE — Progress Notes (Signed)
Mews score 3 due to SBP in left arm. Took a manual measurement of right arm and obtained an improved value (see vitals flowsheet). Per patient, readings in left arm are always unusually low. However, nothing in patient history accounts for this and no previous notes have been made about a blood pressure discrepancy. He is in no distress. Cardiology notified.
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
## 2019-11-29 ENCOUNTER — Inpatient Hospital Stay (HOSPITAL_COMMUNITY): Payer: HMO

## 2019-11-29 ENCOUNTER — Encounter (HOSPITAL_COMMUNITY): Payer: Self-pay | Admitting: Internal Medicine

## 2019-11-29 ENCOUNTER — Inpatient Hospital Stay (HOSPITAL_COMMUNITY): Payer: HMO | Admitting: Certified Registered Nurse Anesthetist

## 2019-11-29 ENCOUNTER — Encounter (HOSPITAL_COMMUNITY)
Admission: EM | Disposition: A | Discharge status: Home Health | Payer: Self-pay | Source: Home / Self Care | Attending: Internal Medicine

## 2019-11-29 DIAGNOSIS — R0602 Shortness of breath: Secondary | ICD-10-CM

## 2019-11-29 DIAGNOSIS — R7881 Bacteremia: Secondary | ICD-10-CM

## 2019-11-29 HISTORY — PX: BUBBLE STUDY: SHX6837

## 2019-11-29 HISTORY — PX: TEE WITHOUT CARDIOVERSION: SHX5443

## 2019-11-29 LAB — GLUCOSE, CAPILLARY
Glucose-Capillary: 164 mg/dL — ABNORMAL HIGH (ref 70–99)
Glucose-Capillary: 155 mg/dL — ABNORMAL HIGH (ref 70–99)
Glucose-Capillary: 142 mg/dL — ABNORMAL HIGH (ref 70–99)
Glucose-Capillary: 145 mg/dL — ABNORMAL HIGH (ref 70–99)
Glucose-Capillary: 198 mg/dL — ABNORMAL HIGH (ref 70–99)

## 2019-11-29 LAB — CULTURE, BLOOD (ROUTINE X 2): Special Requests: ADEQUATE

## 2019-11-29 LAB — BASIC METABOLIC PANEL
Anion gap: 11 (ref 5–15)
BUN: 37 mg/dL — ABNORMAL HIGH (ref 8–23)
CO2: 30 mmol/L (ref 22–32)
Calcium: 8.1 mg/dL — ABNORMAL LOW (ref 8.9–10.3)
Chloride: 96 mmol/L — ABNORMAL LOW (ref 98–111)
Creatinine, Ser: 1.42 mg/dL — ABNORMAL HIGH (ref 0.61–1.24)
GFR calc Af Amer: 52 mL/min — ABNORMAL LOW (ref >60)
GFR calc non Af Amer: 45 mL/min — ABNORMAL LOW (ref >60)
Glucose, Bld: 150 mg/dL — ABNORMAL HIGH (ref 70–99)
Potassium: 3.9 mmol/L (ref 3.5–5.1)
Sodium: 137 mmol/L (ref 135–145)

## 2019-11-29 LAB — PROTIME-INR
INR: 1.3 — ABNORMAL HIGH (ref 0.8–1.2)
Prothrombin Time: 16.4 seconds — ABNORMAL HIGH (ref 11.4–15.2)

## 2019-11-29 LAB — PH, BODY FLUID: pH, Body Fluid: 8.1

## 2019-11-29 LAB — CBC
HCT: 36.2 % — ABNORMAL LOW (ref 39.0–52.0)
Hemoglobin: 11.8 g/dL — ABNORMAL LOW (ref 13.0–17.0)
MCH: 30.3 pg (ref 26.0–34.0)
MCHC: 32.6 g/dL (ref 30.0–36.0)
MCV: 93.1 fL (ref 80.0–100.0)
Platelets: 190 10*3/uL (ref 150–400)
RBC: 3.89 MIL/uL — ABNORMAL LOW (ref 4.22–5.81)
RDW: 14.8 % (ref 11.5–15.5)
WBC: 10.6 10*3/uL — ABNORMAL HIGH (ref 4.0–10.5)
nRBC: 0 % (ref 0.0–0.2)

## 2019-11-29 SURGERY — ECHOCARDIOGRAM, TRANSESOPHAGEAL
Anesthesia: Monitor Anesthesia Care

## 2019-11-29 MED ORDER — SODIUM CHLORIDE 0.9 % IV SOLN
INTRAVENOUS | Status: AC | PRN
  Administered 2019-11-29: 500 mL via INTRAVENOUS

## 2019-11-29 MED ORDER — LIDOCAINE VISCOUS HCL 2 % MT SOLN
OROMUCOSAL | Status: AC
  Filled 2019-11-29: qty 15

## 2019-11-29 MED ORDER — EPHEDRINE SULFATE-NACL 50-0.9 MG/10ML-% IV SOSY
PREFILLED_SYRINGE | INTRAVENOUS | Status: DC | PRN
  Administered 2019-11-29 (×3): 5 mg via INTRAVENOUS

## 2019-11-29 MED ORDER — PROPOFOL 500 MG/50ML IV EMUL
INTRAVENOUS | Status: DC | PRN
  Administered 2019-11-29: 75 ug/kg/min via INTRAVENOUS

## 2019-11-29 MED ORDER — PHENYLEPHRINE 40 MCG/ML (10ML) SYRINGE FOR IV PUSH (FOR BLOOD PRESSURE SUPPORT)
PREFILLED_SYRINGE | INTRAVENOUS | Status: DC | PRN
  Administered 2019-11-29 (×5): 80 ug via INTRAVENOUS

## 2019-11-29 MED ORDER — PROPOFOL 10 MG/ML IV BOLUS
INTRAVENOUS | Status: DC | PRN
  Administered 2019-11-29 (×2): 10 mg via INTRAVENOUS

## 2019-11-29 MED ORDER — WARFARIN SODIUM 5 MG PO TABS
5.0000 mg | ORAL_TABLET | Freq: Once | ORAL | Status: AC
  Administered 2019-11-29: 5 mg via ORAL
  Filled 2019-11-29: qty 1

## 2019-11-29 MED ORDER — SODIUM CHLORIDE 0.9 % IV SOLN: INTRAVENOUS | Status: DC | PRN

## 2019-11-29 MED ORDER — SODIUM CHLORIDE 0.9 % IV SOLN: INTRAVENOUS | Status: DC

## 2019-11-29 MED ORDER — BUTAMBEN-TETRACAINE-BENZOCAINE 2-2-14 % EX AERO
INHALATION_SPRAY | CUTANEOUS | Status: DC | PRN
  Administered 2019-11-29: 2 via TOPICAL

## 2019-11-29 NOTE — Progress Notes (Signed)
Patient is becoming more easily reoriented but continues to refuse cpap. Will continue to assess willingness to use cpap.

## 2019-11-29 NOTE — Progress Notes (Signed)
ANTICOAGULATION CONSULT NOTE - Follow-Up Consult  Pharmacy Consult for Warfarin Indication: atrial fibrillation  Allergies  Allergen Reactions  . Adhesive [Tape] Other (See Comments)    PATIENT'S SKIN TEARS AND BRUISES VERY EASILY- IS TAKING COUMADIN!!  . Aldactone [Spironolactone] Other (See Comments)    Hyperkalemia  reported by Dr. [name] - pt is currently taking 12.5 mg daily -November 2017 per medication list by same MD  . Losartan Other (See Comments)    "Landed the patient in the hospital"- AFFECTED KIDNEY FUNCTION    Patient Measurements: Height: 5\' 8"  (172.7 cm) Weight: 186 lb 15.2 oz (84.8 kg) IBW/kg (Calculated) : 68.4  Vital Signs: Temp: 98 F (36.7 C) (02/04 0834) Temp Source: Oral (02/04 0834) BP: 129/64 (02/04 0835) Pulse Rate: 68 (02/04 0835)  Labs: Recent Labs    11/26/19 1057 11/26/19 1210 11/26/19 1214 11/27/19 0405 11/27/19 0405 11/28/19 0516 11/29/19 0341  HGB 12.5*   < >  --  12.3*   < > 12.0* 11.8*  HCT 40.0   < >  --  38.2*  --  36.9* 36.2*  PLT 189   < >  --  195  --  180 190  LABPROT  --    < >  --  42.0*  --  16.4* 16.4*  INR  --    < >  --  4.4*  --  1.3* 1.3*  CREATININE 1.54*   < >  --  1.32*  --  1.43* 1.42*  TROPONINIHS 41*  --  42*  --   --   --   --    < > = values in this interval not displayed.    Estimated Creatinine Clearance: 41.1 mL/min (A) (by C-G formula based on SCr of 1.42 mg/dL (H)).   Medical History: Past Medical History:  Diagnosis Date  . Atrial fibrillation ([place])    persistent, previously seen at [facility] and placed on amiodarone  . Benign prostatic hypertrophy   . CAD (coronary artery disease)    multivessel s/p inferolateral wall MI with subsequent CABG 11/1998.  Cath 2009 with Patent grafts  . Cleft palate   . COPD with emphysema ([facility]) 04/01/2010  . DM (diabetes mellitus), type 2 ([place])   . Dyspnea   . GERD (gastroesophageal reflux disease)   . HTN (hypertension)   . Hyperlipidemia   . Hypothyroidism    . Iron deficiency anemia   . Ischemic dilated cardiomyopathy ([facility])    EF 35-40% by MUGA 6/11  . MSSA bacteremia 11/27/2019  . Myocardial infarction ([facility] [place])   . Nasal septal deviation   . Nephrolithiasis   . OSA (obstructive sleep apnea)   . PAF (paroxysmal atrial fibrillation) ([place])   . Peripheral arterial disease (HCC)    left subclavian artery stenosis  . PNA (pneumonia)   . Psoriasis   . Seborrheic keratosis   . Stroke ([place])   . Systolic congestive heart failure ([place]) 2009   s/p BiV ICD implantation by Dr Leonia Reeves ([facility])   Assessment: 89 YOM presenting with SOB, on warfarin PTA for AFib. Pharmacy consulted to dose warfarin inpatient. INR on admission supratherapeutic at 3.8 with last dose taken on 1/31. PTA dosing: 5mg  daily except 2.5mg  Mon/Fri  INR back down 4.4>>1.3 after doses held 2/1 and 2/2 and given vitamin K 5mg  IV x 1 on 2/2 prior to thoracentesis, and warfarin dosing resumed on 2/3. INR stable at 1.3 today. CBC stable. No active bleed issues documented. Patient continues on PTA amiodarone.  Goal of Therapy:  INR 2-3 Monitor platelets by anticoagulation protocol: Yes   Plan:  Warfarin 5mg  PO x 1 dose again tonight Monitor daily INR, CBC, s/sx bleeding   Elicia Lamp, PharmD, BCPS Please check AMION for all [facility] contact numbers Clinical Pharmacist 11/29/2019 9:23 AM
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
## 2019-11-29 NOTE — Anesthesia Preprocedure Evaluation (Signed)
Anesthesia Evaluation  Patient identified by MRN, date of birth, ID band Patient awake    Reviewed: Allergy & Precautions, NPO status , Patient's Chart, lab work & pertinent test results  Airway Mallampati: I  TM Distance: >3 FB Neck ROM: Full    Dental   Pulmonary sleep apnea , COPD, former smoker,    Pulmonary exam normal        Cardiovascular hypertension, + CAD, + Past MI and +CHF  Normal cardiovascular exam+ Cardiac Defibrillator      Neuro/Psych CVA    GI/Hepatic GERD  Medicated and Controlled,  Endo/Other  diabetes, Type 2  Renal/GU Renal InsufficiencyRenal disease     Musculoskeletal   Abdominal   Peds  Hematology   Anesthesia Other Findings   Reproductive/Obstetrics                             Anesthesia Physical Anesthesia Plan  ASA: III  Anesthesia Plan: MAC   Post-op Pain Management:    Induction: Intravenous  PONV Risk Score and Plan: 1 and Treatment may vary due to age or medical condition  Airway Management Planned: Nasal Cannula  Additional Equipment:   Intra-op Plan:   Post-operative Plan:   Informed Consent: I have reviewed the patients History and Physical, chart, labs and discussed the procedure including the risks, benefits and alternatives for the proposed anesthesia with the patient or authorized representative who has indicated his/her understanding and acceptance.       Plan Discussed with: CRNA and Surgeon  Anesthesia Plan Comments:         Anesthesia Quick Evaluation

## 2019-11-29 NOTE — Progress Notes (Signed)
SLP Cancellation Note  Patient Details Name: PURVIS CROSWELL MRN: [id number] DOB: 1934-04-28   Cancelled treatment:       Reason Eval/Treat Not Completed: Other (comment) SLP with plans to complete MBS with pt today, but pt NPO for TEE scheduled this afternoon. Will f/u as able.    Osie Bond., M.A. [facility] Pager [phone] Office [phone]  11/29/2019, 8:17 AM

## 2019-11-29 NOTE — CV Procedure (Signed)
TEE  Consent signed Throat anesthetized with cetacaine spray Mouth guard placed  TEE probe advanced to mid esophagus without difficulty  LVEF is severely depressed RVEF is depressed   No obvious vegetations seen   NO PFO by color doppler or with injection of agitated saline  Mild fixed plaquing in thoracic aorta     Full report to follow in CV section of chart   Dorris Carnes MD
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
## 2019-11-29 NOTE — Transfer of Care (Signed)
Immediate Anesthesia Transfer of Care Note  Patient: [name]  Procedure(s) Performed: TRANSESOPHAGEAL ECHOCARDIOGRAM (TEE) (N/A )  Patient Location: Endoscopy Unit  Anesthesia Type:MAC  Level of Consciousness: drowsy  Airway & Oxygen Therapy: Patient Spontanous Breathing and Patient connected to nasal cannula oxygen  Post-op Assessment: Report given to RN and Post -op Vital signs reviewed and stable  Post vital signs: Reviewed  Last Vitals:  Vitals Value Taken Time  BP    Temp    Pulse 65 11/29/19 1337  Resp 28 11/29/19 1337  SpO2 96 % 11/29/19 1337    Last Pain:  Vitals:   11/29/19 1337  TempSrc: Oral  PainSc: 0-No pain      Patients Stated Pain Goal: 2 (86/77/37 3668)  Complications: No apparent anesthesia complications

## 2019-11-29 NOTE — Anesthesia Postprocedure Evaluation (Signed)
Anesthesia Post Note  Patient: RONALDO CRILLY  Procedure(s) Performed: TRANSESOPHAGEAL ECHOCARDIOGRAM (TEE) (N/A )     Patient location during evaluation: PACU Anesthesia Type: MAC Level of consciousness: awake and alert Pain management: pain level controlled Vital Signs Assessment: post-procedure vital signs reviewed and stable Respiratory status: spontaneous breathing, nonlabored ventilation, respiratory function stable and patient connected to nasal cannula oxygen Cardiovascular status: stable and blood pressure returned to baseline Postop Assessment: no apparent nausea or vomiting Anesthetic complications: no    Last Vitals:  Vitals:   11/29/19 1355 11/29/19 1425  BP: (!) 103/44 112/79  Pulse: 62 63  Resp: (!) 22 18  Temp:  (!) 36.3 C  SpO2: 93% 96%    Last Pain:  Vitals:   11/29/19 1425  TempSrc: Oral  PainSc:                  Sherrell Farish DAVID
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
## 2019-11-29 NOTE — Progress Notes (Signed)
  Echocardiogram Echocardiogram Transesophageal has been performed.  [name] [name] 11/29/2019, 1:52 PM

## 2019-11-29 NOTE — Interval H&P Note (Signed)
History and Physical Interval Note:  11/29/2019 1:02 PM  [place]  has presented today for surgery, with the diagnosis of BACTEREMIA.  The various methods of treatment have been discussed with the patient and family. After consideration of risks, benefits and other options for treatment, the patient has consented to  Procedure(s): TRANSESOPHAGEAL ECHOCARDIOGRAM (TEE) (N/A) as a surgical intervention.  The patient's history has been reviewed, patient examined, no change in status, stable for surgery.  I have reviewed the patient's chart and labs.  Questions were answered to the patient's satisfaction.     Dorris Carnes

## 2019-11-29 NOTE — Anesthesia Procedure Notes (Signed)
Procedure Name: MAC Date/Time: 11/29/2019 1:10 PM Performed by: Janene Harvey, CRNA Pre-anesthesia Checklist: Patient identified, Emergency Drugs available, Suction available and Patient being monitored Oxygen Delivery Method: Simple face mask Dental Injury: Teeth and Oropharynx as per pre-operative assessment

## 2019-11-30 ENCOUNTER — Inpatient Hospital Stay: Payer: Self-pay

## 2019-11-30 ENCOUNTER — Inpatient Hospital Stay (HOSPITAL_COMMUNITY): Payer: HMO

## 2019-11-30 DIAGNOSIS — T148XXA Other injury of unspecified body region, initial encounter: Secondary | ICD-10-CM

## 2019-11-30 LAB — CBC
HCT: 37.5 % — ABNORMAL LOW (ref 39.0–52.0)
Hemoglobin: 12.4 g/dL — ABNORMAL LOW (ref 13.0–17.0)
MCH: 30.5 pg (ref 26.0–34.0)
MCHC: 33.1 g/dL (ref 30.0–36.0)
MCV: 92.4 fL (ref 80.0–100.0)
Platelets: 227 10*3/uL (ref 150–400)
RBC: 4.06 MIL/uL — ABNORMAL LOW (ref 4.22–5.81)
RDW: 14.9 % (ref 11.5–15.5)
WBC: 10.6 10*3/uL — ABNORMAL HIGH (ref 4.0–10.5)
nRBC: 0 % (ref 0.0–0.2)

## 2019-11-30 LAB — BODY FLUID CULTURE: Culture: NO GROWTH

## 2019-11-30 LAB — GLUCOSE, CAPILLARY
Glucose-Capillary: 200 mg/dL — ABNORMAL HIGH (ref 70–99)
Glucose-Capillary: 154 mg/dL — ABNORMAL HIGH (ref 70–99)
Glucose-Capillary: 179 mg/dL — ABNORMAL HIGH (ref 70–99)
Glucose-Capillary: 182 mg/dL — ABNORMAL HIGH (ref 70–99)

## 2019-11-30 LAB — BASIC METABOLIC PANEL
Anion gap: 8 (ref 5–15)
BUN: 35 mg/dL — ABNORMAL HIGH (ref 8–23)
CO2: 29 mmol/L (ref 22–32)
Calcium: 8.4 mg/dL — ABNORMAL LOW (ref 8.9–10.3)
Chloride: 101 mmol/L (ref 98–111)
Creatinine, Ser: 1.29 mg/dL — ABNORMAL HIGH (ref 0.61–1.24)
GFR calc Af Amer: 59 mL/min — ABNORMAL LOW (ref >60)
GFR calc non Af Amer: 51 mL/min — ABNORMAL LOW (ref >60)
Glucose, Bld: 215 mg/dL — ABNORMAL HIGH (ref 70–99)
Potassium: 4.2 mmol/L (ref 3.5–5.1)
Sodium: 138 mmol/L (ref 135–145)

## 2019-11-30 LAB — PROTIME-INR
INR: 1.4 — ABNORMAL HIGH (ref 0.8–1.2)
Prothrombin Time: 17.5 seconds — ABNORMAL HIGH (ref 11.4–15.2)

## 2019-11-30 MED ORDER — FUROSEMIDE 10 MG/ML IJ SOLN
20.0000 mg | Freq: Two times a day (BID) | INTRAMUSCULAR | Status: DC
  Administered 2019-11-30 – 2019-12-01 (×2): 20 mg via INTRAVENOUS
  Filled 2019-11-30 (×2): qty 2

## 2019-11-30 MED ORDER — WARFARIN SODIUM 5 MG PO TABS
5.0000 mg | ORAL_TABLET | Freq: Once | ORAL | Status: AC
  Administered 2019-11-30: 19:00:00 5 mg via ORAL
  Filled 2019-11-30: qty 1

## 2019-11-30 MED ORDER — RESOURCE THICKENUP CLEAR PO POWD
ORAL | Status: DC | PRN
  Filled 2019-11-30: qty 125

## 2019-11-30 NOTE — Progress Notes (Signed)
Modified Barium Swallow Progress Note  Patient Details  Name: [name] MRN: [id number] Date of Birth: [birth year]  Today's Date: 11/30/2019  Modified Barium Swallow completed.  Full report located under Chart Review in the Imaging Section.  Brief recommendations include the following:  Clinical Impression  Pt presents with mild to moderate pharyngeal dysphagia characterized by reduced lingual retration, reduced anterior laryngeal movement,  and intermittent incomplete epiglottic retroversion. He demonstrated mild-moderate vallecular residue, mild pyriform sinus residue, and mild posterior pharyngeal wall residue. Penetration (PAS 3) was noted with thin liquids placing him at increased risk for aspiration after the swallow. Pt presented with a weak cough and prompted coughing was effective in propelling the penetrant superiorly but ineffective in expelling it from the larynx. Aspiration of the penetrant is therefore a likely eventuality. A chin tuck posture was effective in eliminating penetration with thin liquids; however, his immediate and consistent compliance with this is questioned. No instances of penetration or aspiration were noted with other solids or liquids. A regular texture diet with nectar thick liquids is recommended at this time until pt is able to demonstrate consistent compliance with use of the chin tuck posture. In the absence of this, his aspiration risk with thin liquids is judged to be mild-moderate. SLP will continue to follow pt to assess tolerance of the recommended diet and to initiate dysphagia treatment.    Swallow Evaluation Recommendations       SLP Diet Recommendations: Regular solids;Nectar thick liquid   Liquid Administration via: Cup   Medication Administration: Whole meds with puree   Supervision: Patient able to self feed;Intermittent supervision to cue for compensatory strategies   Compensations: Slow rate;Small sips/bites;Chin tuck   Postural  Changes: Remain semi-upright after after feeds/meals (Comment);Seated upright at 90 degrees   Oral Care Recommendations: Oral care BID   Other Recommendations: Order thickener from pharmacy   [place] [address] [place]in Negus, [place], [place] Office number [phone] Pager [phone]  [name] 11/30/2019,11:30 AM
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
## 2019-11-30 NOTE — Progress Notes (Signed)
PHARMACY CONSULT NOTE FOR:  OUTPATIENT  PARENTERAL ANTIBIOTIC THERAPY (OPAT)  Indication: MSSA bacteremia and likely pacemaker infection treatment requiring treatment for endocarditis  Regimen: cefazolin 2g IV q8h  End date: 01/07/2020  IV antibiotic discharge orders are pended. To discharging provider:  please sign these orders via discharge navigator,  Select New Orders & click on the button choice - Manage This Unsigned Work.     Thank you for allowing pharmacy to be a part of this patient's care.  Cristela Felt, PharmD PGY1 Pharmacy Resident [employer]: [phone]   11/30/2019, 10:31 AM
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
## 2019-11-30 NOTE — Care Management Important Message (Signed)
Important Message  Patient Details  Name: [name] MRN: [id number] Date of Birth: [birth year]   Medicare Important Message Given:  Yes     Shelda Altes 11/30/2019, 11:23 AM
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
## 2019-11-30 NOTE — Plan of Care (Signed)
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
## 2019-11-30 NOTE — Progress Notes (Signed)
ANTICOAGULATION CONSULT NOTE - Follow-Up Consult  Pharmacy Consult for Warfarin Indication: atrial fibrillation  Allergies  Allergen Reactions  . Adhesive [Tape] Other (See Comments)    PATIENT'S SKIN TEARS AND BRUISES VERY EASILY- IS TAKING COUMADIN!!  . Aldactone [Spironolactone] Other (See Comments)    Hyperkalemia  reported by Dr. [name] - pt is currently taking 12.5 mg daily -November 2017 per medication list by same MD  . Losartan Other (See Comments)    "Landed the patient in the hospital"- AFFECTED KIDNEY FUNCTION    Patient Measurements: Height: 5\' 8"  (172.7 cm) Weight: 182 lb 8.7 oz (82.8 kg) IBW/kg (Calculated) : 68.4  Vital Signs: Temp: 97.7 F (36.5 C) (02/05 0401) Temp Source: Oral (02/05 0401) BP: 133/67 (02/05 0401) Pulse Rate: 71 (02/05 0401)  Labs: Recent Labs    11/28/19 0516 11/28/19 0516 11/29/19 0341 11/30/19 0421  HGB 12.0*   < > 11.8* 12.4*  HCT 36.9*  --  36.2* 37.5*  PLT 180  --  190 227  LABPROT 16.4*  --  16.4* 17.5*  INR 1.3*  --  1.3* 1.4*  CREATININE 1.43*  --  1.42* 1.29*   < > = values in this interval not displayed.    Estimated Creatinine Clearance: 44.7 mL/min (A) (by C-G formula based on SCr of 1.29 mg/dL (H)).   Medical History: Past Medical History:  Diagnosis Date  . Atrial fibrillation ([place])    persistent, previously seen at [facility] and placed on amiodarone  . Benign prostatic hypertrophy   . CAD (coronary artery disease)    multivessel s/p inferolateral wall MI with subsequent CABG 11/1998.  Cath 2009 with Patent grafts  . Cleft palate   . COPD with emphysema ([place]) 04/01/2010  . DM (diabetes mellitus), type 2 ([place])   . Dyspnea   . GERD (gastroesophageal reflux disease)   . HTN (hypertension)   . Hyperlipidemia   . Hypothyroidism   . Iron deficiency anemia   . Ischemic dilated cardiomyopathy ([place])    EF 35-40% by MUGA 6/11  . MSSA bacteremia 11/27/2019  . Myocardial infarction ([place])   . Nasal septal deviation    . Nephrolithiasis   . OSA (obstructive sleep apnea)   . PAF (paroxysmal atrial fibrillation) ([place])   . Peripheral arterial disease (HCC)    left subclavian artery stenosis  . PNA (pneumonia)   . Psoriasis   . Seborrheic keratosis   . Stroke ([place])   . Systolic congestive heart failure ([place]) 2009   s/p BiV ICD implantation by Dr Leonia Reeves (MDT)   Assessment: 89 YOM presenting with SOB, on warfarin PTA for AFib. INR elevated on admit then subsequently reversed with IV vitamin K on 2/2 with need for thoracentesis. Warfarin resumed and INR now trending up slowly as expected.  **PTA dosing: 5mg  daily except 2.5mg  Mon/Fri  Goal of Therapy:  INR 2-3 Monitor platelets by anticoagulation protocol: Yes   Plan:  -Warfarin 5mg  PO x1 tonight -Daily protime   Arrie Senate, PharmD, BCPS Clinical Pharmacist [phone] Please check AMION for all [place] numbers 11/30/2019
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
## 2019-11-30 NOTE — Progress Notes (Signed)
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 to  30%. The left ventricle has severely decreased function. There is no left ventricular hypertrophy.  2. The left ventricle demonstrates global hypokinesis.  3. Global right ventricle reduced.  4. The mitral valve is normal in structure. Trivial mitral valve regurgitation.  5. The tricuspid valve is normal in structure.  6. The tricuspid valve is normal in structure. Tricuspid valve regurgitation is trivial.  7. The aortic valve is tricuspid. Aortic valve regurgitation is trivial. Mild aortic valve sclerosis without stenosis.  8. The pulmonic valve was grossly normal. Pulmonic valve regurgitation is not visualized.  9. Fixed plaquing in the thoracic aorta. 10. A pacer wire is visualized. 11. no obvious vegetations FINDINGS  Left Ventricle: Left ventricular ejection fraction, by visual estimation, is 25 to 30%. The left ventricle has severely decreased function. The left ventricle demonstrates global  hypokinesis. There is no left ventricular hypertrophy. Right Ventricle: The right ventricular size is normal. Right vetricular wall thickness was not assessed. Global RV systolic function is reduced. Left Atrium: Left atrial size was normal in size. Right Atrium: Right atrial size was normal in size Pericardium: There is no evidence of pericardial effusion. Mitral Valve: The mitral valve is normal in structure. Trivial mitral valve regurgitation. Tricuspid Valve: The tricuspid valve is normal in structure. Tricuspid valve regurgitation is trivial. Aortic Valve: The aortic valve is tricuspid. Aortic valve regurgitation is trivial. Mild aortic valve sclerosis is present, with no evidence of aortic valve stenosis. Pulmonic Valve: The pulmonic valve was grossly normal. Pulmonic valve regurgitation is not visualized. Aorta: The aortic root is normal in size and structure. Fixed plaquing in the thoracic aorta. Shunts: Agitated saline contrast was given intravenously to evaluate for intracardiac shunting. No atrial level shunt detected by color flow Doppler. Additional Comments: A pacer wire is visualized.  Dorris Carnes MD Electronically signed by Dorris Carnes MD Signature Date/Time: 11/29/2019/4:10:42 PM    Final     Headache Sarah sorry of I have a nonurgent consult only older guy scheduled Meds: . amiodarone  200 mg Oral Daily  . arformoterol  15 mcg Nebulization BID  . aspirin EC  324 mg Oral Daily  . atorvastatin  80 mg Oral Daily  . budesonide (PULMICORT) nebulizer solution  0.25 mg Nebulization BID  . carvedilol  3.125 mg Oral BID WC  . ferrous sulfate  325 mg Oral QPM  . fluticasone  2 spray Each Nare Daily  . furosemide  20 mg Intravenous BID  . guaiFENesin  1,200 mg Oral BID  . hydrocortisone cream  1 application Topical BID  . insulin aspart  0-9 Units Subcutaneous TID WC  . insulin glargine  14 Units Subcutaneous Daily  . levothyroxine  100 mcg Oral QAC breakfast  . potassium chloride  40 mEq Oral  Daily  . sodium chloride flush  3 mL Intravenous Q12H  . warfarin  5 mg Oral ONCE-1800  . Warfarin - Pharmacist Dosing Inpatient   Does not apply q1800   Continuous Infusions: .  ceFAZolin (ANCEF) IV 2 g (11/30/19 1444)     LOS: 4 days    Time spent: 37min  Domenic Polite, MD [facility]   11/30/2019, 3:15 PM
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
## 2019-12-01 ENCOUNTER — Inpatient Hospital Stay (HOSPITAL_COMMUNITY): Payer: HMO

## 2019-12-01 LAB — BASIC METABOLIC PANEL
Anion gap: 11 (ref 5–15)
BUN: 27 mg/dL — ABNORMAL HIGH (ref 8–23)
CO2: 27 mmol/L (ref 22–32)
Calcium: 8.4 mg/dL — ABNORMAL LOW (ref 8.9–10.3)
Chloride: 100 mmol/L (ref 98–111)
Creatinine, Ser: 1.16 mg/dL (ref 0.61–1.24)
GFR calc Af Amer: >60 mL/min (ref >60)
GFR calc non Af Amer: 58 mL/min — ABNORMAL LOW (ref >60)
Glucose, Bld: 141 mg/dL — ABNORMAL HIGH (ref 70–99)
Potassium: 3.8 mmol/L (ref 3.5–5.1)
Sodium: 138 mmol/L (ref 135–145)

## 2019-12-01 LAB — CBC
HCT: 37.2 % — ABNORMAL LOW (ref 39.0–52.0)
Hemoglobin: 12 g/dL — ABNORMAL LOW (ref 13.0–17.0)
MCH: 30.1 pg (ref 26.0–34.0)
MCHC: 32.3 g/dL (ref 30.0–36.0)
MCV: 93.2 fL (ref 80.0–100.0)
Platelets: 230 10*3/uL (ref 150–400)
RBC: 3.99 MIL/uL — ABNORMAL LOW (ref 4.22–5.81)
RDW: 14.9 % (ref 11.5–15.5)
WBC: 12.1 10*3/uL — ABNORMAL HIGH (ref 4.0–10.5)
nRBC: 0 % (ref 0.0–0.2)

## 2019-12-01 LAB — GLUCOSE, CAPILLARY
Glucose-Capillary: 148 mg/dL — ABNORMAL HIGH (ref 70–99)
Glucose-Capillary: 179 mg/dL — ABNORMAL HIGH (ref 70–99)
Glucose-Capillary: 253 mg/dL — ABNORMAL HIGH (ref 70–99)
Glucose-Capillary: 135 mg/dL — ABNORMAL HIGH (ref 70–99)

## 2019-12-01 LAB — PROTIME-INR
INR: 1.8 — ABNORMAL HIGH (ref 0.8–1.2)
Prothrombin Time: 21 seconds — ABNORMAL HIGH (ref 11.4–15.2)

## 2019-12-01 MED ORDER — SODIUM CHLORIDE 0.9% FLUSH
10.0000 mL | Freq: Two times a day (BID) | INTRAVENOUS | Status: DC
  Administered 2019-12-01: 20 mL
  Administered 2019-12-02 – 2019-12-06 (×6): 10 mL

## 2019-12-01 MED ORDER — WARFARIN SODIUM 3 MG PO TABS
3.0000 mg | ORAL_TABLET | Freq: Once | ORAL | Status: AC
  Administered 2019-12-01: 18:00:00 3 mg via ORAL
  Filled 2019-12-01: qty 1

## 2019-12-01 MED ORDER — SODIUM CHLORIDE 0.9 % IV SOLN: INTRAVENOUS | Status: DC

## 2019-12-01 MED ORDER — FUROSEMIDE 10 MG/ML IJ SOLN: 40.0000 mg | Freq: Two times a day (BID) | INTRAMUSCULAR | Status: DC

## 2019-12-01 MED ORDER — SODIUM CHLORIDE 0.9% FLUSH: 10.0000 mL | INTRAVENOUS | Status: DC | PRN

## 2019-12-01 MED ORDER — CHLORHEXIDINE GLUCONATE CLOTH 2 % EX PADS
6.0000 | MEDICATED_PAD | Freq: Every day | CUTANEOUS | Status: DC
  Administered 2019-12-01 – 2019-12-05 (×5): 6 via TOPICAL

## 2019-12-01 MED ORDER — POTASSIUM CHLORIDE CRYS ER 20 MEQ PO TBCR
40.0000 meq | EXTENDED_RELEASE_TABLET | Freq: Two times a day (BID) | ORAL | Status: AC
  Administered 2019-12-01 (×2): 40 meq via ORAL
  Filled 2019-12-01 (×2): qty 2

## 2019-12-01 MED ORDER — SODIUM CHLORIDE 0.9 % IV BOLUS
250.0000 mL | Freq: Once | INTRAVENOUS | Status: AC
  Administered 2019-12-01: 250 mL via INTRAVENOUS

## 2019-12-01 MED ORDER — CEFAZOLIN IV (FOR PTA / DISCHARGE USE ONLY): 2.0000 g | Freq: Three times a day (TID) | INTRAVENOUS | 0 refills | Status: DC

## 2019-12-01 NOTE — Progress Notes (Signed)
   12/01/19 1334  Vitals  Temp 98 F (36.7 C)  Temp Source Oral  BP (!) 70/49  MAP (mmHg) (!) 57  BP Location Left Arm  BP Method Automatic  Patient Position (if appropriate) Sitting  Pulse Rate 62  Pulse Rate Source Monitor  Resp (!) 22  Oxygen Therapy  SpO2 95 %  O2 Device Nasal Cannula  O2 Flow Rate (L/min) 3 L/min  MEWS Score  MEWS Temp 0  MEWS Systolic 2  MEWS Pulse 0  MEWS RR 1  MEWS LOC 0  MEWS Score 3  MEWS Score Color Yellow   MD aware of blood pressure.  Patient sitting in chair and is asymptomatic, save for tiredness.  Is oriented to person / place.  Will return patient to bed and continue to monitor.

## 2019-12-01 NOTE — Progress Notes (Signed)
Spoke with Marge RN re PICC order.  States is for d/c home today.

## 2019-12-01 NOTE — TOC Progression Note (Signed)
Transition of Care [facility]) - Progression Note    Patient Details  Name: [name] MRN: [id number] Date of Birth: [birth year]  [facility] CM/SW Contact  Carles Collet, RN Phone Number: 12/01/2019, 1:15 PM  Clinical Narrative:    [facility] unable to take patient. [facility] will run insurance and let us know tomorrow if they can accept. SOC will not be until Wednesday if they are able to see provide services.     Expected Discharge Plan: [place] Barriers to Discharge: Continued Medical Work up  Expected Discharge Plan and Services Expected Discharge Plan: [place]   Discharge Planning Services: CM Consult   Living arrangements for the past 2 months: Single Family Home                 DME Arranged: (NA)         HH Arranged: PT, OT, RN, IV Antibiotics HH Agency: [facility] [employer] Date [employer]: 11/30/19 Time [place]: 1713 Representative spoke with at [place]: [place] ([place]) Interventions    Readmission Risk Interventions Readmission Risk Prevention Plan 11/30/2019 05/03/2019 01/13/2019  Transportation Screening Complete Complete -  [place] or [place] Complete Complete -  Social Work Consult for [place] Planning/Counseling Complete Complete -  Palliative Care Screening Not Applicable Not Applicable -  Medication Review Press photographer) Complete Complete Complete  PCP or Specialist appointment within 3-5 days of discharge - - Complete  HRI or [place] recent data might be hidden

## 2019-12-01 NOTE — Progress Notes (Signed)
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 Puree Pharyngeal residue - valleculae;Pharyngeal residue - pyriform Pharyngeal -- Pharyngeal- Mechanical Soft -- Pharyngeal -- Pharyngeal- Regular Pharyngeal residue - valleculae;Pharyngeal residue - pyriform;Pharyngeal residue - posterior pharnyx;Reduced anterior laryngeal mobility Pharyngeal -- Pharyngeal- Multi-consistency -- Pharyngeal -- Pharyngeal- Pill Reduced anterior laryngeal mobility Pharyngeal -- Pharyngeal Comment --  CHL IP CERVICAL ESOPHAGEAL PHASE 11/30/2019 Cervical Esophageal Phase WFL Pudding Teaspoon -- Pudding Cup -- Honey  Teaspoon -- Honey Cup -- Nectar Teaspoon -- Nectar Cup -- Nectar Straw -- Thin Teaspoon -- Thin Cup -- Thin Straw -- Puree -- Mechanical Soft -- Regular -- Multi-consistency -- Pill -- Cervical Esophageal Comment -- Shanika I. Hardin Negus, [place], [place] Office number [phone] Pager [phone] Horton Marshall 11/30/2019, 11:37 AM              [place] EKG SITE RITE  Result Date:  11/30/2019 If [facility] image not attached, placement could not be confirmed due to current cardiac rhythm.   Headache Judson Roch sorry of I have a nonurgent consult only older guy scheduled Meds: . amiodarone  200 mg Oral Daily  . arformoterol  15 mcg Nebulization BID  . aspirin EC  324 mg Oral Daily  . atorvastatin  80 mg Oral Daily  . budesonide (PULMICORT) nebulizer solution  0.25 mg Nebulization BID  . carvedilol  3.125 mg Oral BID WC  . Chlorhexidine Gluconate Cloth  6 each Topical Daily  . ferrous sulfate  325 mg Oral QPM  . fluticasone  2 spray Each Nare Daily  . furosemide  40 mg Intravenous BID  . guaiFENesin  1,200 mg Oral BID  . hydrocortisone cream  1 application Topical BID  . insulin aspart  0-9 Units Subcutaneous TID WC  . insulin glargine  14 Units Subcutaneous Daily  . levothyroxine  100 mcg Oral QAC breakfast  . potassium chloride  40 mEq Oral BID  . sodium chloride flush  10-40 mL Intracatheter Q12H  . sodium chloride flush  3 mL Intravenous Q12H  . warfarin  3 mg Oral ONCE-1800  . Warfarin - Pharmacist Dosing Inpatient   Does not apply q1800   Continuous Infusions: .  ceFAZolin (ANCEF) IV 2 g (12/01/19 0415)     LOS: 5 days    Time spent: 72min  Domenic Polite, MD [facility]   12/01/2019, 1:57 PM

## 2019-12-01 NOTE — Progress Notes (Signed)
ANTICOAGULATION CONSULT NOTE - Follow-Up Consult  Pharmacy Consult for Warfarin Indication: atrial fibrillation  Allergies  Allergen Reactions  . Adhesive [Tape] Other (See Comments)    PATIENT'S SKIN TEARS AND BRUISES VERY EASILY- IS TAKING COUMADIN!!  . Aldactone [Spironolactone] Other (See Comments)    Hyperkalemia  reported by Dr. [name] - pt is currently taking 12.5 mg daily -November 2017 per medication list by same MD  . Losartan Other (See Comments)    "Landed the patient in the hospital"- AFFECTED KIDNEY FUNCTION    Patient Measurements: Height: 5\' 8"  (172.7 cm) Weight: 177 lb 1.6 oz (80.3 kg) IBW/kg (Calculated) : 68.4  Vital Signs: Temp: 98 F (36.7 C) (02/06 0625) BP: 106/95 (02/06 0625) Pulse Rate: 70 (02/06 0746)  Labs: Recent Labs    11/29/19 0341 11/29/19 0341 11/30/19 0421 12/01/19 0444  HGB 11.8*   < > 12.4* 12.0*  HCT 36.2*  --  37.5* 37.2*  PLT 190  --  227 230  LABPROT 16.4*  --  17.5* 21.0*  INR 1.3*  --  1.4* 1.8*  CREATININE 1.42*  --  1.29* 1.16   < > = values in this interval not displayed.    Estimated Creatinine Clearance: 45.9 mL/min (by C-G formula based on SCr of 1.16 mg/dL).   Medical History: Past Medical History:  Diagnosis Date  . Atrial fibrillation ([place])    persistent, previously seen at [facility] and placed on amiodarone  . Benign prostatic hypertrophy   . CAD (coronary artery disease)    multivessel s/p inferolateral wall MI with subsequent CABG 11/1998.  Cath 2009 with Patent grafts  . Cleft palate   . COPD with emphysema ([place]) 04/01/2010  . DM (diabetes mellitus), type 2 ([place])   . Dyspnea   . GERD (gastroesophageal reflux disease)   . HTN (hypertension)   . Hyperlipidemia   . Hypothyroidism   . Iron deficiency anemia   . Ischemic dilated cardiomyopathy ([place])    EF 35-40% by MUGA 6/11  . MSSA bacteremia 11/27/2019  . Myocardial infarction ([place])   . Nasal septal deviation   . Nephrolithiasis   . OSA (obstructive  sleep apnea)   . PAF (paroxysmal atrial fibrillation) ([place])   . Peripheral arterial disease ([facility])    left subclavian artery stenosis  . PNA (pneumonia)   . Psoriasis   . Seborrheic keratosis   . Stroke ([place])   . Systolic congestive heart failure ([place]) 2009   s/p BiV ICD implantation by Dr Leonia Reeves ([facility])   Assessment: 72 YOM presenting with SOB, on warfarin PTA for AFib. INR elevated on admit then subsequently reversed with IV vitamin K on 2/2 with need for thoracentesis. Warfarin resumed and INR now trending up.  Hg/Hct are stable. No signs/symptoms of bleeding noted.  **PTA dosing: 5mg  daily except 2.5mg  Mon/Fri  Goal of Therapy:  INR 2-3 Monitor platelets by anticoagulation protocol: Yes   Plan:  -Warfarin 3 mg PO x1 tonight -Daily PT-INR - Monitor for signs/symptoms of bleeding   Sherren Kerns, PharmD PGY1 Acute Care Pharmacy Resident Please check AMION for all [facility] Pharmacy numbers 12/01/2019

## 2019-12-01 NOTE — Progress Notes (Signed)
Pt refuseing CPAP for the night. 

## 2019-12-01 NOTE — Plan of Care (Signed)

## 2019-12-01 NOTE — Progress Notes (Signed)
Peripherally Inserted Central Catheter/Midline Placement  The IV Nurse has discussed with the patient and/or persons authorized to consent for the patient, the purpose of this procedure and the potential benefits and risks involved with this procedure.  The benefits include less needle sticks, lab draws from the catheter, and the patient may be discharged home with the catheter. Risks include, but not limited to, infection, bleeding, blood clot (thrombus formation), and puncture of an artery; nerve damage and irregular heartbeat and possibility to perform a PICC exchange if needed/ordered by physician.  Alternatives to this procedure were also discussed.  Bard Power PICC patient education guide, fact sheet on infection prevention and patient information card has been provided to patient /or left at bedside.  Telephone consent obtained from wife  Pt cooperative, states "Do whatever you need to do", giving verbal and implied consent although slightly disoriented  PICC/Midline Placement Documentation  PICC Double Lumen 12/01/19 PICC Right Brachial 37 cm 0 cm (Active)  Indication for Insertion or Continuance of Line Home intravenous therapies (PICC only) 12/01/19 0946  Exposed Catheter (cm) 0 cm 12/01/19 0946  Site Assessment Clean;Dry;Intact 12/01/19 0946  Lumen #1 Status Flushed;Saline locked;Blood return noted 12/01/19 0946  Lumen #2 Status Flushed;Saline locked;Blood return noted 12/01/19 0946  Dressing Type Transparent 12/01/19 0946  Dressing Status Clean;Dry;Intact;Antimicrobial disc in place 12/01/19 [place] checked and tightened 12/01/19 0946  Line Adjustment (NICU/IV Team Only) No 12/01/19 0946  Dressing Intervention New dressing 12/01/19 0946  Dressing Change Due 12/08/19 12/01/19 0946       Rolena Infante 12/01/2019, 9:47 AM

## 2019-12-02 LAB — CULTURE, BLOOD (ROUTINE X 2)
Culture: NO GROWTH
Culture: NO GROWTH
Culture: NO GROWTH
Culture: NO GROWTH
Special Requests: ADEQUATE
Special Requests: ADEQUATE

## 2019-12-02 LAB — BASIC METABOLIC PANEL
Anion gap: 10 (ref 5–15)
BUN: 20 mg/dL (ref 8–23)
CO2: 28 mmol/L (ref 22–32)
Calcium: 8.5 mg/dL — ABNORMAL LOW (ref 8.9–10.3)
Chloride: 104 mmol/L (ref 98–111)
Creatinine, Ser: 1.08 mg/dL (ref 0.61–1.24)
GFR calc Af Amer: >60 mL/min (ref >60)
GFR calc non Af Amer: >60 mL/min (ref >60)
Glucose, Bld: 103 mg/dL — ABNORMAL HIGH (ref 70–99)
Potassium: 5.1 mmol/L (ref 3.5–5.1)
Sodium: 142 mmol/L (ref 135–145)

## 2019-12-02 LAB — MAGNESIUM: Magnesium: 2 mg/dL (ref 1.7–2.4)

## 2019-12-02 LAB — CBC
HCT: 36.8 % — ABNORMAL LOW (ref 39.0–52.0)
Hemoglobin: 11.9 g/dL — ABNORMAL LOW (ref 13.0–17.0)
MCH: 30.3 pg (ref 26.0–34.0)
MCHC: 32.3 g/dL (ref 30.0–36.0)
MCV: 93.6 fL (ref 80.0–100.0)
Platelets: 242 10*3/uL (ref 150–400)
RBC: 3.93 MIL/uL — ABNORMAL LOW (ref 4.22–5.81)
RDW: 14.6 % (ref 11.5–15.5)
WBC: 13.9 10*3/uL — ABNORMAL HIGH (ref 4.0–10.5)
nRBC: 0 % (ref 0.0–0.2)

## 2019-12-02 LAB — GLUCOSE, CAPILLARY
Glucose-Capillary: 112 mg/dL — ABNORMAL HIGH (ref 70–99)
Glucose-Capillary: 210 mg/dL — ABNORMAL HIGH (ref 70–99)
Glucose-Capillary: 255 mg/dL — ABNORMAL HIGH (ref 70–99)
Glucose-Capillary: 239 mg/dL — ABNORMAL HIGH (ref 70–99)

## 2019-12-02 LAB — PROTIME-INR
INR: 1.9 — ABNORMAL HIGH (ref 0.8–1.2)
Prothrombin Time: 21.2 seconds — ABNORMAL HIGH (ref 11.4–15.2)

## 2019-12-02 MED ORDER — FUROSEMIDE 10 MG/ML IJ SOLN
20.0000 mg | Freq: Two times a day (BID) | INTRAMUSCULAR | Status: DC
  Administered 2019-12-02: 09:00:00 20 mg via INTRAVENOUS
  Filled 2019-12-02: qty 2

## 2019-12-02 MED ORDER — WARFARIN SODIUM 5 MG PO TABS
5.0000 mg | ORAL_TABLET | Freq: Once | ORAL | Status: AC
  Administered 2019-12-02: 5 mg via ORAL
  Filled 2019-12-02: qty 1

## 2019-12-02 MED ORDER — MIDODRINE HCL 5 MG PO TABS
5.0000 mg | ORAL_TABLET | Freq: Two times a day (BID) | ORAL | Status: DC
  Administered 2019-12-02: 17:00:00 5 mg via ORAL
  Filled 2019-12-02 (×2): qty 1

## 2019-12-02 NOTE — Plan of Care (Signed)

## 2019-12-02 NOTE — Care Management (Signed)
[facility] will start auth process Monday. Potential [facility] Wednesday.  Patient will have a $50 copay per home health visit.
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
## 2019-12-02 NOTE — Progress Notes (Signed)
PT scored 2 on MEWS 2/2 BP 70/57. PT noted to be asymptomatic.BP Conformed at 75/58. Dr. Broadus Jawaan Adachi contacted. Manual BP also performed 76/40. Lasix held and Midodrine ordered. Will continue to monitor and treat as appropriate.

## 2019-12-02 NOTE — Progress Notes (Signed)
ANTICOAGULATION CONSULT NOTE - Follow-Up Consult  Pharmacy Consult for Warfarin Indication: atrial fibrillation  Allergies  Allergen Reactions  . Adhesive [Tape] Other (See Comments)    PATIENT'S SKIN TEARS AND BRUISES VERY EASILY- IS TAKING COUMADIN!!  . Aldactone [Spironolactone] Other (See Comments)    Hyperkalemia  reported by Dr. [name] - pt is currently taking 12.5 mg daily -November 2017 per medication list by same MD  . Losartan Other (See Comments)    "Landed the patient in the hospital"- AFFECTED KIDNEY FUNCTION    Patient Measurements: Height: 5\' 8"  (172.7 cm) Weight: 187 lb 11.2 oz (85.1 kg) IBW/kg (Calculated) : 68.4  Vital Signs: Temp: 98.5 F (36.9 C) (02/07 0454) Temp Source: Oral (02/07 0454) BP: 103/53 (02/07 0454) Pulse Rate: 78 (02/07 0715)  Labs: Recent Labs    11/30/19 0421 11/30/19 0421 12/01/19 0444 12/02/19 0500  HGB 12.4*   < > 12.0* 11.9*  HCT 37.5*  --  37.2* 36.8*  PLT 227  --  230 242  LABPROT 17.5*  --  21.0* 21.2*  INR 1.4*  --  1.8* 1.9*  CREATININE 1.29*  --  1.16 1.08   < > = values in this interval not displayed.    Estimated Creatinine Clearance: 54.1 mL/min (by C-G formula based on SCr of 1.08 mg/dL).   Medical History: Past Medical History:  Diagnosis Date  . Atrial fibrillation ([place])    persistent, [facility] and placed on amiodarone  . Benign prostatic hypertrophy   . CAD (coronary artery disease)    multivessel s/p inferolateral wall MI with subsequent CABG 11/1998.  Cath 2009 with Patent grafts  . Cleft palate   . COPD with emphysema ([place]) 04/01/2010  . DM (diabetes mellitus), type 2 ([place])   . Dyspnea   . GERD (gastroesophageal reflux disease)   . HTN (hypertension)   . Hyperlipidemia   . Hypothyroidism   . Iron deficiency anemia   . Ischemic dilated cardiomyopathy ([place])    EF 35-40% by MUGA 6/11  . MSSA bacteremia 11/27/2019  . Myocardial infarction ([place])   . Nasal septal deviation   .  Nephrolithiasis   . OSA (obstructive sleep apnea)   . PAF (paroxysmal atrial fibrillation) ([place])   . Peripheral arterial disease ([facility])    left subclavian artery stenosis  . PNA (pneumonia)   . Psoriasis   . Seborrheic keratosis   . Stroke ([place])   . Systolic congestive heart failure ([place]) 2009   s/p BiV ICD implantation by Dr Leonia Reeves (MDT)   Assessment: 54 YOM presenting with SOB, on warfarin PTA for AFib. INR elevated on admit then subsequently reversed with IV vitamin K on 2/2 with need for thoracentesis. Warfarin resumed and INR now trending up appropriately. INR is 1.9 today.  Hg/Hct are stable. No signs/symptoms of bleeding noted.  **PTA dosing: 5mg  daily except 2.5mg  Mon/Fri  Goal of Therapy:  INR 2-3 Monitor platelets by anticoagulation protocol: Yes   Plan:  - Warfarin 5 mg PO x1 tonight (have not ordered due to discharge orders already in place) - Daily PT-INR - Monitor for signs/symptoms of bleeding   Sherren Kerns, PharmD PGY1 [facility] Resident Please check AMION for all [facility] Pharmacy numbers 12/02/2019

## 2019-12-03 ENCOUNTER — Inpatient Hospital Stay (HOSPITAL_COMMUNITY): Payer: HMO

## 2019-12-03 DIAGNOSIS — J189 Pneumonia, unspecified organism: Secondary | ICD-10-CM

## 2019-12-03 LAB — GLUCOSE, CAPILLARY
Glucose-Capillary: 124 mg/dL — ABNORMAL HIGH (ref 70–99)
Glucose-Capillary: 142 mg/dL — ABNORMAL HIGH (ref 70–99)
Glucose-Capillary: 179 mg/dL — ABNORMAL HIGH (ref 70–99)
Glucose-Capillary: 266 mg/dL — ABNORMAL HIGH (ref 70–99)

## 2019-12-03 LAB — BASIC METABOLIC PANEL
Anion gap: 8 (ref 5–15)
BUN: 16 mg/dL (ref 8–23)
CO2: 28 mmol/L (ref 22–32)
Calcium: 8.2 mg/dL — ABNORMAL LOW (ref 8.9–10.3)
Chloride: 102 mmol/L (ref 98–111)
Creatinine, Ser: 0.96 mg/dL (ref 0.61–1.24)
GFR calc Af Amer: >60 mL/min (ref >60)
GFR calc non Af Amer: >60 mL/min (ref >60)
Glucose, Bld: 118 mg/dL — ABNORMAL HIGH (ref 70–99)
Potassium: 4.2 mmol/L (ref 3.5–5.1)
Sodium: 138 mmol/L (ref 135–145)

## 2019-12-03 LAB — CBC
HCT: 36.6 % — ABNORMAL LOW (ref 39.0–52.0)
Hemoglobin: 11.7 g/dL — ABNORMAL LOW (ref 13.0–17.0)
MCH: 30.4 pg (ref 26.0–34.0)
MCHC: 32 g/dL (ref 30.0–36.0)
MCV: 95.1 fL (ref 80.0–100.0)
Platelets: 242 10*3/uL (ref 150–400)
RBC: 3.85 MIL/uL — ABNORMAL LOW (ref 4.22–5.81)
RDW: 14.8 % (ref 11.5–15.5)
WBC: 13.5 10*3/uL — ABNORMAL HIGH (ref 4.0–10.5)
nRBC: 0 % (ref 0.0–0.2)

## 2019-12-03 LAB — PROTIME-INR
INR: 2.2 — ABNORMAL HIGH (ref 0.8–1.2)
Prothrombin Time: 24.5 seconds — ABNORMAL HIGH (ref 11.4–15.2)

## 2019-12-03 MED ORDER — WARFARIN SODIUM 2.5 MG PO TABS
2.5000 mg | ORAL_TABLET | Freq: Once | ORAL | Status: AC
  Administered 2019-12-03: 2.5 mg via ORAL
  Filled 2019-12-03: qty 1

## 2019-12-03 MED ORDER — MIDODRINE HCL 5 MG PO TABS
10.0000 mg | ORAL_TABLET | Freq: Two times a day (BID) | ORAL | Status: DC
  Administered 2019-12-03 – 2019-12-04 (×3): 10 mg via ORAL
  Filled 2019-12-03 (×3): qty 2

## 2019-12-03 NOTE — TOC Progression Note (Signed)
Transition of Care [facility]) - Progression Note    Patient Details  Name: [name] MRN: [id number] Date of Birth: September 28, 1934  Transition of Care [facility] CM/SW Contact  Zenon Mayo, RN Phone Number: 12/03/2019, 10:16 AM  Clinical Narrative:    NCM spoke with wife over the phone, she is at the bedside, informed her that we have not been able to find an [facility] agency to take referral, but [facility]  Is checking with their office to see if they can take referral ,it will be a 50.00 co pay per visit.  NCM informed wife that Malachy Mood with [facility] will reach out to her once she finds out information.   Expected Discharge Plan: [place] Barriers to Discharge: Continued Medical Work up  Expected Discharge Plan and Services Expected Discharge Plan: [place]   Discharge Planning Services: CM Consult   Living arrangements for the past 2 months: Single Family Home                 DME Arranged: (NA)         HH Arranged: PT, OT, RN, IV Antibiotics HH Agency: [facility] [employer] Date [place]: 11/30/19 Time [place]: 1713 Representative spoke with at [place]: [place] ([place]) Interventions    Readmission Risk Interventions Readmission Risk Prevention Plan 11/30/2019 05/03/2019 01/13/2019  Transportation Screening Complete Complete -  [place] or [place] Complete Complete -  Social Work Consult for [place] Planning/Counseling Complete Complete -  Palliative Care Screening Not Applicable Not Applicable -  Medication Review Press photographer) Complete Complete Complete  PCP or Specialist appointment within 3-5 days of discharge - - Complete  [place] or [place] recent data might be hidden

## 2019-12-03 NOTE — Care Management Important Message (Signed)
Important Message  Patient Details  Name: DAMIAN PROVANCE MRN: [id number] Date of Birth: 06-17-1934   Medicare Important Message Given:  Yes     Shelda Altes 12/03/2019, 3:35 PM

## 2019-12-03 NOTE — Plan of Care (Signed)
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
## 2019-12-03 NOTE — Progress Notes (Signed)
ANTICOAGULATION CONSULT NOTE - Follow-Up Consult  Pharmacy Consult for Warfarin Indication: atrial fibrillation  Allergies  Allergen Reactions  . Adhesive [Tape] Other (See Comments)    PATIENT'S SKIN TEARS AND BRUISES VERY EASILY- IS TAKING COUMADIN!!  . Aldactone [Spironolactone] Other (See Comments)    Hyperkalemia  reported by Dr. [name] - pt is currently taking 12.5 mg daily -November 2017 per medication list by same MD  . Losartan Other (See Comments)    "Landed the patient in the hospital"- AFFECTED KIDNEY FUNCTION    Patient Measurements: Height: 5\' 8"  (172.7 cm) Weight: 187 lb 13.3 oz (85.2 kg) IBW/kg (Calculated) : 68.4  Vital Signs: Temp: 97.5 F (36.4 C) (02/08 0405) Temp Source: Oral (02/08 0405) BP: 92/68 (02/08 0405) Pulse Rate: 72 (02/08 0405)  Labs: Recent Labs    12/01/19 0444 12/01/19 0444 12/02/19 0500 12/03/19 0411  HGB 12.0*   < > 11.9* 11.7*  HCT 37.2*  --  36.8* 36.6*  PLT 230  --  242 242  LABPROT 21.0*  --  21.2* 24.5*  INR 1.8*  --  1.9* 2.2*  CREATININE 1.16  --  1.08 0.96   < > = values in this interval not displayed.    Estimated Creatinine Clearance: 60.8 mL/min (by C-G formula based on SCr of 0.96 mg/dL).   Medical History: Past Medical History:  Diagnosis Date  . Atrial fibrillation ([place])    persistent, previously seen at [facility] and placed on amiodarone  . Benign prostatic hypertrophy   . CAD (coronary artery disease)    multivessel s/p inferolateral wall MI with subsequent CABG 11/1998.  Cath 2009 with Patent grafts  . Cleft palate   . COPD with emphysema ([place]) 04/01/2010  . DM (diabetes mellitus), type 2 ([place])   . Dyspnea   . GERD (gastroesophageal reflux disease)   . HTN (hypertension)   . Hyperlipidemia   . Hypothyroidism   . Iron deficiency anemia   . Ischemic dilated cardiomyopathy ([place])    EF 35-40% by MUGA 6/11  . MSSA bacteremia 11/27/2019  . Myocardial infarction ([place])   . Nasal septal deviation   .  Nephrolithiasis   . OSA (obstructive sleep apnea)   . PAF (paroxysmal atrial fibrillation) ([place])   . Peripheral arterial disease ([facility])    left subclavian artery stenosis  . PNA (pneumonia)   . Psoriasis   . Seborrheic keratosis   . Stroke ([place])   . Systolic congestive heart failure ([place]) 2009   s/p BiV ICD implantation by Dr Leonia Reeves (MDT)   Assessment: 4 YOM presenting with SOB, on warfarin PTA for AFib. INR elevated on admit then subsequently reversed with IV vitamin K on 2/2 with need for thoracentesis. Warfarin resumed and INR now trending up appropriately. INR is 2.2 today.    **PTA dosing: 5mg  daily except 2.5mg  Mon/Fri  Goal of Therapy:  INR 2-3 Monitor platelets by anticoagulation protocol: Yes   Plan:  - Warfarin 2.55 mg PO x1 tonight  - Daily PT-INR   Hildred Laser, PharmD Clinical Pharmacist **Pharmacist phone directory can now be found on [email or web address] ([email or web address]).  Listed under [place].
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
## 2019-12-03 NOTE — Progress Notes (Signed)
      INFECTIOUS DISEASE ATTENDING ADDENDUM:   Date: 12/03/2019  Patient name: [name]  Medical record number: [id number]  Date of birth: 1934-05-08   See my last note  His bacteremia is cleared w repeat cultures  We will rx with 6 weeks of IV ancef followed by suppressive keflex  He HSFU with me  SNF or Ltach need to be aware of appt  I will sign off for now  Please call with further questions.    Alcide Evener 12/03/2019, 5:42 PM

## 2019-12-04 DIAGNOSIS — I4821 Permanent atrial fibrillation: Secondary | ICD-10-CM

## 2019-12-04 DIAGNOSIS — R0989 Other specified symptoms and signs involving the circulatory and respiratory systems: Secondary | ICD-10-CM

## 2019-12-04 DIAGNOSIS — I771 Stricture of artery: Secondary | ICD-10-CM

## 2019-12-04 LAB — BASIC METABOLIC PANEL
Anion gap: 7 (ref 5–15)
BUN: 10 mg/dL (ref 8–23)
CO2: 31 mmol/L (ref 22–32)
Calcium: 8.3 mg/dL — ABNORMAL LOW (ref 8.9–10.3)
Chloride: 99 mmol/L (ref 98–111)
Creatinine, Ser: 0.97 mg/dL (ref 0.61–1.24)
GFR calc Af Amer: >60 mL/min (ref >60)
GFR calc non Af Amer: >60 mL/min (ref >60)
Glucose, Bld: 136 mg/dL — ABNORMAL HIGH (ref 70–99)
Potassium: 4.3 mmol/L (ref 3.5–5.1)
Sodium: 137 mmol/L (ref 135–145)

## 2019-12-04 LAB — GLUCOSE, CAPILLARY
Glucose-Capillary: 124 mg/dL — ABNORMAL HIGH (ref 70–99)
Glucose-Capillary: 221 mg/dL — ABNORMAL HIGH (ref 70–99)
Glucose-Capillary: 273 mg/dL — ABNORMAL HIGH (ref 70–99)
Glucose-Capillary: 238 mg/dL — ABNORMAL HIGH (ref 70–99)

## 2019-12-04 LAB — CBC
HCT: 38.3 % — ABNORMAL LOW (ref 39.0–52.0)
Hemoglobin: 12.3 g/dL — ABNORMAL LOW (ref 13.0–17.0)
MCH: 30.4 pg (ref 26.0–34.0)
MCHC: 32.1 g/dL (ref 30.0–36.0)
MCV: 94.8 fL (ref 80.0–100.0)
Platelets: 275 10*3/uL (ref 150–400)
RBC: 4.04 MIL/uL — ABNORMAL LOW (ref 4.22–5.81)
RDW: 14.8 % (ref 11.5–15.5)
WBC: 14.3 10*3/uL — ABNORMAL HIGH (ref 4.0–10.5)
nRBC: 0 % (ref 0.0–0.2)

## 2019-12-04 LAB — PROTIME-INR
INR: 2.5 — ABNORMAL HIGH (ref 0.8–1.2)
Prothrombin Time: 26.5 seconds — ABNORMAL HIGH (ref 11.4–15.2)

## 2019-12-04 LAB — CORTISOL: Cortisol, Plasma: 15.3 ug/dL

## 2019-12-04 MED ORDER — SODIUM CHLORIDE 0.9 % IV BOLUS: 500.0000 mL | Freq: Once | INTRAVENOUS | Status: DC

## 2019-12-04 MED ORDER — SODIUM CHLORIDE 3 % IN NEBU
4.0000 mL | INHALATION_SOLUTION | Freq: Two times a day (BID) | RESPIRATORY_TRACT | Status: DC
  Administered 2019-12-04 – 2019-12-05 (×3): 4 mL via RESPIRATORY_TRACT
  Filled 2019-12-04 (×4): qty 4

## 2019-12-04 MED ORDER — SODIUM CHLORIDE 3 % IN NEBU
4.0000 mL | INHALATION_SOLUTION | Freq: Two times a day (BID) | RESPIRATORY_TRACT | Status: DC
  Administered 2019-12-04: 4 mL via RESPIRATORY_TRACT
  Filled 2019-12-04 (×2): qty 4

## 2019-12-04 MED ORDER — WARFARIN SODIUM 2.5 MG PO TABS
2.5000 mg | ORAL_TABLET | Freq: Once | ORAL | Status: AC
  Administered 2019-12-04: 2.5 mg via ORAL
  Filled 2019-12-04: qty 1
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
## 2019-12-04 NOTE — TOC Transition Note (Signed)
Transition of Care [facility]) - CM/SW Discharge Note   Patient Details  Name: [name] MRN: [id number] Date of Birth: [birth year]  Transition of Care [facility]) CM/SW Contact:  Zenon Mayo, RN Phone Number: 12/04/2019, 10:28 AM   Clinical Narrative:    Malachy Mood with [facility] is able to take referral , she has spoke with wife, and she has no copay.  [place], HHOT.  Patient is for dc today and Carolynn Sayers will do teaching with the wife , patient will receive the 2 pm dose and wife will do the eveing dose and [facility] will start in am.     Final next level of care: Home w Home Health Services Barriers to Discharge: No Barriers Identified   Patient Goals and CMS Choice   CMS Medicare.gov Compare Post Acute Care list provided to:: Patient Represenative (must comment)(wife) Choice offered to / list presented to : Spouse  Discharge Placement                       Discharge Plan and Services   Discharge Planning Services: CM Consult            DME Arranged: (NA)         HH Arranged: RN, PT, OT HH Agency: [place] Date [place]: 11/30/19 Time HH Agency Contacted: 1500 Representative spoke with at [place]: [place] ([place]) Interventions     Readmission Risk Interventions Readmission Risk Prevention Plan 11/30/2019 05/03/2019 01/13/2019  Transportation Screening Complete Complete -  [place] or Home Care Consult Complete Complete -  Social Work Consult for [place] Planning/Counseling Complete Complete -  Palliative Care Screening Not Applicable Not Applicable -  Medication Review Press photographer) Complete Complete Complete  PCP or Specialist appointment within 3-5 days of discharge - - Complete  HRI or [place] recent data might be hidden
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
## 2019-12-05 LAB — BASIC METABOLIC PANEL
Anion gap: 6 (ref 5–15)
BUN: 12 mg/dL (ref 8–23)
CO2: 30 mmol/L (ref 22–32)
Calcium: 8.2 mg/dL — ABNORMAL LOW (ref 8.9–10.3)
Chloride: 100 mmol/L (ref 98–111)
Creatinine, Ser: 1.01 mg/dL (ref 0.61–1.24)
GFR calc Af Amer: >60 mL/min (ref >60)
GFR calc non Af Amer: >60 mL/min (ref >60)
Glucose, Bld: 192 mg/dL — ABNORMAL HIGH (ref 70–99)
Potassium: 4.3 mmol/L (ref 3.5–5.1)
Sodium: 136 mmol/L (ref 135–145)

## 2019-12-05 LAB — CBC
HCT: 37 % — ABNORMAL LOW (ref 39.0–52.0)
Hemoglobin: 11.7 g/dL — ABNORMAL LOW (ref 13.0–17.0)
MCH: 30 pg (ref 26.0–34.0)
MCHC: 31.6 g/dL (ref 30.0–36.0)
MCV: 94.9 fL (ref 80.0–100.0)
Platelets: 270 10*3/uL (ref 150–400)
RBC: 3.9 MIL/uL — ABNORMAL LOW (ref 4.22–5.81)
RDW: 14.8 % (ref 11.5–15.5)
WBC: 13.6 10*3/uL — ABNORMAL HIGH (ref 4.0–10.5)
nRBC: 0 % (ref 0.0–0.2)

## 2019-12-05 LAB — CULTURE, BLOOD (ROUTINE X 2)
Culture: NO GROWTH
Culture: NO GROWTH
Special Requests: ADEQUATE
Special Requests: ADEQUATE

## 2019-12-05 LAB — GLUCOSE, CAPILLARY
Glucose-Capillary: 184 mg/dL — ABNORMAL HIGH (ref 70–99)
Glucose-Capillary: 214 mg/dL — ABNORMAL HIGH (ref 70–99)
Glucose-Capillary: 136 mg/dL — ABNORMAL HIGH (ref 70–99)
Glucose-Capillary: 158 mg/dL — ABNORMAL HIGH (ref 70–99)
Glucose-Capillary: 170 mg/dL — ABNORMAL HIGH (ref 70–99)

## 2019-12-05 LAB — PROTIME-INR
INR: 3 — ABNORMAL HIGH (ref 0.8–1.2)
Prothrombin Time: 30.9 seconds — ABNORMAL HIGH (ref 11.4–15.2)

## 2019-12-05 MED ORDER — SODIUM CHLORIDE 0.9 % IV SOLN
INTRAVENOUS | Status: DC | PRN
  Administered 2019-12-05: 14:00:00 1000 mL via INTRAVENOUS

## 2019-12-05 MED ORDER — CEFAZOLIN IV (FOR PTA / DISCHARGE USE ONLY): 2.0000 g | Freq: Three times a day (TID) | INTRAVENOUS | 0 refills | Status: AC

## 2019-12-05 NOTE — Progress Notes (Signed)
SATURATION QUALIFICATIONS: (This note is used to comply with regulatory documentation for home oxygen)  Patient Saturations on Room Air at Rest = 89%  Patient Saturations on Room Air while Ambulating = 86%  Patient Saturations on 2 Liters of oxygen while Ambulating = 96%  Please briefly explain why patient needs home oxygen: Patient's oxygen saturation dropped to 86% when walking on room air.

## 2019-12-05 NOTE — Care Management (Signed)
ED CM left handoff for Unit Tomi Bamberger CM to follow up.

## 2019-12-05 NOTE — Care Management (Signed)
ED CM called [employer] after hours for update, no answer from [place], updated 3E Nurse.

## 2019-12-05 NOTE — Progress Notes (Signed)
Multiple calls to oxygen company by case Museum/gallery curator. Oxygen still has not been delivered to unit. Wife leaving to go home. Patient to remain in hospital overnight.
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
## 2019-12-05 NOTE — Care Management (Signed)
Referral sent into to [facility] after hours service awaiting a return call.

## 2019-12-05 NOTE — Plan of Care (Signed)
  Problem: Safety: Goal: Ability to remain free from injury will improve Outcome: Progressing   

## 2019-12-05 NOTE — Care Management (Signed)
ED CM received call from [place] patient being discharged and needing to be set up with home oxygen.  States, patient's wife is ready to take patient home. CM explained to the Charge nurse Mikle Bosworth that the orders will need to be process by the DME and this may take a couple of hours.  Spouse is agreeable to this, CM will fax information to [place] to start process.  Laurena Slimmer RN, BSN  ED Care Manager [phone]

## 2019-12-05 NOTE — Progress Notes (Addendum)
ANTICOAGULATION CONSULT NOTE - Follow-Up Consult  Pharmacy Consult for Warfarin Indication: atrial fibrillation  Allergies  Allergen Reactions  . Adhesive [Tape] Other (See Comments)    PATIENT'S SKIN TEARS AND BRUISES VERY EASILY- IS TAKING COUMADIN!!  . Aldactone [Spironolactone] Other (See Comments)    Hyperkalemia  reported by Dr. [name] - pt is currently taking 12.5 mg daily -November 2017 per medication list by same MD  . Losartan Other (See Comments)    "Landed the patient in the hospital"- AFFECTED KIDNEY FUNCTION    Patient Measurements: Height: 5\' 8"  (172.7 cm) Weight: 185 lb (83.9 kg) IBW/kg (Calculated) : 68.4  Vital Signs: Temp: 97.9 F (36.6 C) (02/10 0523) Temp Source: Oral (02/10 0523) BP: 132/73 (02/10 0523) Pulse Rate: 65 (02/10 0750)  Labs: Recent Labs    12/03/19 0411 12/03/19 0411 12/04/19 0420 12/04/19 0746 12/05/19 0500  HGB 11.7*   < >  --  12.3* 11.7*  HCT 36.6*  --   --  38.3* 37.0*  PLT 242  --   --  275 270  LABPROT 24.5*  --  26.5*  --  30.9*  INR 2.2*  --  2.5*  --  3.0*  CREATININE 0.96  --   --  0.97 1.01   < > = values in this interval not displayed.    Estimated Creatinine Clearance: 57.4 mL/min (by C-G formula based on SCr of 1.01 mg/dL).   Medical History: Past Medical History:  Diagnosis Date  . Atrial fibrillation ([place])    persistent, previously seen at [facility] and placed on amiodarone  . Benign prostatic hypertrophy   . CAD (coronary artery disease)    multivessel s/p inferolateral wall MI with subsequent CABG 11/1998.  Cath 2009 with Patent grafts  . Cleft palate   . COPD with emphysema ([place]) 04/01/2010  . DM (diabetes mellitus), type 2 ([place])   . Dyspnea   . GERD (gastroesophageal reflux disease)   . HTN (hypertension)   . Hyperlipidemia   . Hypothyroidism   . Iron deficiency anemia   . Ischemic dilated cardiomyopathy ([place])    EF 35-40% by MUGA 6/11  . MSSA bacteremia 11/27/2019  . Myocardial infarction ([place])    . Nasal septal deviation   . Nephrolithiasis   . OSA (obstructive sleep apnea)   . PAF (paroxysmal atrial fibrillation) ([place])   . Peripheral arterial disease ([facility])    left subclavian artery stenosis  . PNA (pneumonia)   . Psoriasis   . Seborrheic keratosis   . Stroke ([place])   . Systolic congestive heart failure ([place]) 2009   s/p BiV ICD implantation by Dr Leonia Reeves (MDT)   Assessment: 15 YOM presenting with SOB, on warfarin PTA for AFib. INR elevated on admit then subsequently reversed with IV vitamin K on 2/2 with need for thoracentesis. Warfarin resumed and INR now trending up. -INR is 3 today.    **PTA dosing: 5mg  daily except 2.5mg  Mon/Fri  Goal of Therapy:  INR 2-3 Monitor platelets by anticoagulation protocol: Yes   Plan:  - Hold warfarin today due to INR trend up - Daily PT-INR   Hildred Laser, PharmD Clinical Pharmacist **Pharmacist phone directory can now be found on [email or web address] ([email or web address]).  Listed under [place].
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
## 2019-12-05 NOTE — Progress Notes (Signed)
   12/05/19 0810  Mobility  Activity Ambulated in room;Transferred:  Chair to bed (To bed after ambulation)  Range of Motion Active;All extremities  Level of Assistance Standby assist, set-up cues, supervision of patient - no hands on (Min A sit/stand from lower surface (chair))  Assistive Device Front wheel walker  Minutes Stood 3 minutes  Minutes Ambulated 3 minutes  Distance Ambulated (ft) 30 ft  Mobility Response Tolerated well (BP low 55/43 sitting in chair, rechecked and was 120/55 )  Bed Position Semi-fowlers   Mobility Specialist Criteria Algorithm Info.   12/05/2019 11:17 AM

## 2019-12-05 NOTE — TOC Progression Note (Signed)
Transition of Care [facility]) - Progression Note    Patient Details  Name: [name] MRN: [id number] Date of Birth: 1933/11/07  Transition of Care [facility]) CM/SW Contact  Zenon Mayo, RN Phone Number: 12/05/2019, 9:37 AM  Clinical Narrative:    Patient for possible dc today, if he does well after his walk today, He will get the am dose of med here and the wife will do the evening dose and [facility] will plan to do the Thursday am dose.  NCM informed Ronalee Belts RN to let this NCM know if he does good with his walk so we can get the [facility] scheduled for tomorrow.   Expected Discharge Plan: [place] Barriers to Discharge: No Barriers Identified  Expected Discharge Plan and Services Expected Discharge Plan: [place]   Discharge Planning Services: CM Consult   Living arrangements for the past 2 months: Single Family Home                 DME Arranged: (NA)         HH Arranged: RN, PT, OT HH Agency: [place] Date [place]: 11/30/19 Time HH Agency Contacted: 1500 Representative spoke with at [place]: [place] ([place]) Interventions    Readmission Risk Interventions Readmission Risk Prevention Plan 11/30/2019 05/03/2019 01/13/2019  Transportation Screening Complete Complete -  [place] or Home Care Consult Complete Complete -  Social Work Consult for [place] Planning/Counseling Complete Complete -  Palliative Care Screening Not Applicable Not Applicable -  Medication Review Press photographer) Complete Complete Complete  PCP or Specialist appointment within 3-5 days of discharge - - Complete  HRI or [place] recent data might be hidden

## 2019-12-05 NOTE — Discharge Summary (Signed)
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 effect (meaning this test can be used) for the duration of the COVID-19 declaration under Section 56 4(b)(1) of the Act, 21 U.S.C. section 360bbb-3(b)(1), unless the authorization is terminated or revoked sooner. Performed at [facility], [place] [address], [place], [place] [address]   Culture, blood (Routine X 2) w Reflex to ID Panel     Status: None   Collection Time: 11/27/19  9:29 AM   Specimen: BLOOD  Result Value Ref Range Status   Specimen Description BLOOD LEFT ANTECUBITAL  Final   Special Requests   Final    BOTTLES DRAWN AEROBIC ONLY Blood Culture results may not be optimal due to an excessive volume of blood received in culture bottles   Culture   Final    NO GROWTH 5 DAYS Performed at [facility], [place] [address] [place], [place] [address]    Report Status 12/02/2019 FINAL  Final  Culture, blood (Routine X 2) w Reflex to ID Panel     Status: None   Collection Time: 11/27/19  9:33 AM   Specimen: BLOOD LEFT HAND  Result Value Ref Range  Status   Specimen Description BLOOD LEFT HAND  Final   Special Requests   Final    BOTTLES DRAWN AEROBIC ONLY Blood Culture results may not be optimal due to an excessive volume of blood received in culture bottles   Culture   Final    NO GROWTH 5 DAYS Performed at [facility], [place] [address], [place], [place] [address]    Report Status 12/02/2019 FINAL  Final  Body fluid culture (includes gram stain)     Status: None   Collection Time: 11/27/19 12:01 PM   Specimen: Pleural Fluid  Result Value Ref Range Status   Specimen Description PLEURAL  Final   Special Requests NONE  Final   Gram Stain   Final    RARE WBC PRESENT, PREDOMINANTLY PMN NO ORGANISMS SEEN    Culture   Final    NO GROWTH 3 DAYS Performed at [facility], [place] [address] [place], [place] [address]    Report Status 11/30/2019 FINAL  Final  Culture, blood (routine x 2)     Status: None    Collection Time: 11/27/19  2:02 PM   Specimen: BLOOD  Result Value Ref Range Status   Specimen Description BLOOD LEFT ANTECUBITAL  Final   Special Requests   Final    BOTTLES DRAWN AEROBIC AND ANAEROBIC Blood Culture adequate volume   Culture   Final    NO GROWTH 5 DAYS Performed at [facility], [place] [address] [place], [place] [address]    Report Status 12/02/2019 FINAL  Final  Culture, blood (routine x 2)     Status: None   Collection Time: 11/27/19  2:05 PM   Specimen: BLOOD LEFT HAND  Result Value Ref Range Status   Specimen Description BLOOD LEFT HAND  Final   Special Requests   Final    BOTTLES DRAWN AEROBIC AND ANAEROBIC Blood Culture adequate volume   Culture   Final    NO GROWTH 5 DAYS Performed at [facility], [place] [address] [place], [place] [address]    Report Status 12/02/2019 FINAL  Final  Culture, blood (Routine X 2) w Reflex to ID Panel     Status: None   Collection Time: 11/30/19 10:46 AM   Specimen: BLOOD  Result Value Ref Range Status   Specimen Description BLOOD RIGHT ANTECUBITAL  Final   Special Requests   Final    BOTTLES DRAWN AEROBIC AND ANAEROBIC Blood Culture adequate volume   Culture   Final    NO GROWTH 5 DAYS Performed at [facility], [place] [address] [place], [place] [address]    Report Status 12/05/2019 FINAL  Final  Culture, blood (Routine X 2) w Reflex to ID Panel     Status: None   Collection Time: 11/30/19 10:54 AM   Specimen: BLOOD  Result Value Ref Range Status   Specimen Description BLOOD LEFT ANTECUBITAL  Final   Special Requests   Final    BOTTLES DRAWN AEROBIC AND ANAEROBIC Blood Culture adequate volume   Culture   Final    NO GROWTH 5 DAYS Performed at [facility], [place] [address] [place], [place] [address]    Report Status 12/05/2019 FINAL  Final     Labs: BNP (last 3 results) Recent Labs    01/09/19 0409 05/02/19 1813 11/26/19 1211  BNP 569.8* 1,255.8* 4,462.8*   Basic Metabolic  Panel: Recent Labs  Lab 12/01/19 0444 12/02/19 0500 12/03/19 0411  12/04/19 0746 12/05/19 0500  NA 138 142 138 137 136  K 3.8 5.1 4.2 4.3 4.3  CL 100 104 102 99 100  CO2 27 28 28 31 30   GLUCOSE 141* 103* 118* 136* 192*  BUN 27* 20 16 10 12   CREATININE 1.16 1.08 0.96 0.97 1.01  CALCIUM 8.4* 8.5* 8.2* 8.3* 8.2*  MG  --  2.0  --   --   --    Liver Function Tests: No results for input(s): AST, ALT, ALKPHOS, BILITOT, PROT, ALBUMIN in the last 168 hours. No results for input(s): LIPASE, AMYLASE in the last 168 hours. No results for input(s): AMMONIA in the last 168 hours. CBC: Recent Labs  Lab 12/01/19 0444 12/02/19 0500 12/03/19 0411 12/04/19 0746 12/05/19 0500  WBC 12.1* 13.9* 13.5* 14.3* 13.6*  HGB 12.0* 11.9* 11.7* 12.3* 11.7*  HCT 37.2* 36.8* 36.6* 38.3* 37.0*  MCV 93.2 93.6 95.1 94.8 94.9  PLT 230 242 242 275 270   Cardiac Enzymes: No results for input(s): CKTOTAL, CKMB, CKMBINDEX, TROPONINI in the last 168 hours. BNP: Invalid input(s): POCBNP CBG: Recent Labs  Lab 12/04/19 1645 12/04/19 2109 12/05/19 0608 12/05/19 0730 12/05/19 1115  GLUCAP 273* 238* 184* 214* 136*   D-Dimer No results for input(s): DDIMER in the last 72 hours. Hgb A1c No results for input(s): HGBA1C in the last 72 hours. Lipid Profile No results for input(s): CHOL, HDL, LDLCALC, TRIG, CHOLHDL, LDLDIRECT in the last 72 hours. Thyroid function studies No results for input(s): TSH, T4TOTAL, T3FREE, THYROIDAB in the last 72 hours.  Invalid input(s): FREET3 Anemia work up No results for input(s): VITAMINB12, FOLATE, FERRITIN, TIBC, IRON, RETICCTPCT in the last 72 hours. Urinalysis    Component Value Date/Time   COLORURINE STRAW (A) 11/26/2019 1900   APPEARANCEUR CLEAR 11/26/2019 1900   LABSPEC 1.009 11/26/2019 1900   PHURINE 5.0 11/26/2019 1900   GLUCOSEU NEGATIVE 11/26/2019 1900   HGBUR NEGATIVE 11/26/2019 1900   BILIRUBINUR NEGATIVE 11/26/2019 1900   KETONESUR NEGATIVE 11/26/2019  1900   PROTEINUR NEGATIVE 11/26/2019 1900   UROBILINOGEN 0.2 11/28/2008 0325   NITRITE NEGATIVE 11/26/2019 1900   LEUKOCYTESUR NEGATIVE 11/26/2019 1900   Sepsis Labs Invalid input(s): PROCALCITONIN,  WBC,  LACTICIDVEN Microbiology Recent Results (from the past 240 hour(s))  Culture, blood (routine x 2)     Status: Abnormal   Collection Time: 11/26/19 11:13 AM   Specimen: BLOOD RIGHT WRIST  Result Value Ref Range Status   Specimen Description BLOOD RIGHT WRIST  Final   Special Requests   Final    BOTTLES DRAWN AEROBIC AND ANAEROBIC Blood Culture results may not be optimal due to an inadequate volume of blood received in culture bottles   Culture  Setup Time   Final    GRAM POSITIVE COCCI IN CLUSTERS IN BOTH AEROBIC AND ANAEROBIC BOTTLES CRITICAL VALUE NOTED.  VALUE IS CONSISTENT WITH PREVIOUSLY REPORTED AND CALLED VALUE.    Culture (A)  Final    STAPHYLOCOCCUS AUREUS SUSCEPTIBILITIES PERFORMED ON PREVIOUS CULTURE WITHIN THE LAST 5 DAYS. Performed at [facility], [place] [address], [place], [place] [address]    Report Status 11/29/2019 FINAL  Final  Culture, blood (routine x 2)     Status: Abnormal   Collection Time: 11/26/19  1:05 PM   Specimen: BLOOD  Result Value Ref Range Status   Specimen Description BLOOD RIGHT ANTECUBITAL  Final   Special Requests   Final    BOTTLES DRAWN AEROBIC ONLY Blood Culture adequate volume  Culture  Setup Time   Final    GRAM POSITIVE COCCI IN CLUSTERS AEROBIC BOTTLE ONLY CRITICAL RESULT CALLED TO, READ BACK BY AND VERIFIED WITH: PHARMD J LEDFORD [id number] AT 78 AM BY CM Performed at [facility], [place] [address], [place], [place] [address]    Culture STAPHYLOCOCCUS AUREUS (A)  Final   Report Status 11/29/2019 FINAL  Final   Organism ID, Bacteria STAPHYLOCOCCUS AUREUS  Final      Susceptibility   Staphylococcus aureus - MIC*    CIPROFLOXACIN <=0.5 SENSITIVE Sensitive     ERYTHROMYCIN >=8 RESISTANT Resistant     GENTAMICIN <=0.5  SENSITIVE Sensitive     OXACILLIN 1 SENSITIVE Sensitive     TETRACYCLINE <=1 SENSITIVE Sensitive     VANCOMYCIN <=0.5 SENSITIVE Sensitive     TRIMETH/SULFA <=10 SENSITIVE Sensitive     CLINDAMYCIN <=0.25 SENSITIVE Sensitive     RIFAMPIN <=0.5 SENSITIVE Sensitive     Inducible Clindamycin NEGATIVE Sensitive     * STAPHYLOCOCCUS AUREUS  Blood Culture ID Panel (Reflexed)     Status: Abnormal   Collection Time: 11/26/19  1:05 PM  Result Value Ref Range Status   Enterococcus species NOT DETECTED NOT DETECTED Final   Listeria monocytogenes NOT DETECTED NOT DETECTED Final   Staphylococcus species DETECTED (A) NOT DETECTED Final    Comment: CRITICAL RESULT CALLED TO, READ BACK BY AND VERIFIED WITH: PHARMD J LEDFORD [id number] AT 632 AM BY CM    Staphylococcus aureus (BCID) DETECTED (A) NOT DETECTED Final    Comment: Methicillin (oxacillin) susceptible Staphylococcus aureus (MSSA). Preferred therapy is anti staphylococcal beta lactam antibiotic (Cefazolin or Nafcillin), unless clinically contraindicated. CRITICAL RESULT CALLED TO, READ BACK BY AND VERIFIED WITH: PHARMD J LEDFORD [id number] AT 53 AM BY CM    Methicillin resistance NOT DETECTED NOT DETECTED Final   Streptococcus species NOT DETECTED NOT DETECTED Final   Streptococcus agalactiae NOT DETECTED NOT DETECTED Final   Streptococcus pneumoniae NOT DETECTED NOT DETECTED Final   Streptococcus pyogenes NOT DETECTED NOT DETECTED Final   Acinetobacter baumannii NOT DETECTED NOT DETECTED Final   Enterobacteriaceae species NOT DETECTED NOT DETECTED Final   Enterobacter cloacae complex NOT DETECTED NOT DETECTED Final   Escherichia coli NOT DETECTED NOT DETECTED Final   Klebsiella oxytoca NOT DETECTED NOT DETECTED Final   Klebsiella pneumoniae NOT DETECTED NOT DETECTED Final   Proteus species NOT DETECTED NOT DETECTED Final   Serratia marcescens NOT DETECTED NOT DETECTED Final   Haemophilus influenzae NOT DETECTED NOT DETECTED Final   Neisseria  meningitidis NOT DETECTED NOT DETECTED Final   Pseudomonas aeruginosa NOT DETECTED NOT DETECTED Final   Candida albicans NOT DETECTED NOT DETECTED Final   Candida glabrata NOT DETECTED NOT DETECTED Final   Candida krusei NOT DETECTED NOT DETECTED Final   Candida parapsilosis NOT DETECTED NOT DETECTED Final   Candida tropicalis NOT DETECTED NOT DETECTED Final    Comment: Performed at [facility], [place]. [address], [place], [place] [address]  SARS CORONAVIRUS 2 (TAT 6-24 HRS) Nasopharyngeal Nasopharyngeal Swab     Status: None   Collection Time: 11/26/19  8:44 PM   Specimen: Nasopharyngeal Swab  Result Value Ref Range Status   SARS Coronavirus 2 NEGATIVE NEGATIVE Final    Comment: (NOTE) SARS-CoV-2 target nucleic acids are NOT DETECTED. The SARS-CoV-2 RNA is generally detectable in upper and lower respiratory specimens during the acute phase of infection. Negative results do not preclude SARS-CoV-2 infection, do not rule out co-infections with  other pathogens, and should not be used as the sole basis for treatment or other patient management decisions. Negative results must be combined with clinical observations, patient history, and epidemiological information. The expected result is Negative. Fact Sheet for Patients: SugarRoll.be Fact Sheet for Healthcare Providers: https://www.woods-mathews.com/ This test is not yet approved or cleared by the [place] FDA and  has been authorized for detection and/or diagnosis of SARS-CoV-2 by FDA under an Emergency Use Authorization (EUA). This EUA will remain  in effect (meaning this test can be used) for the duration of the COVID-19 declaration under Section 56 4(b)(1) of the Act, 21 U.S.C. section 360bbb-3(b)(1), unless the authorization is terminated or revoked sooner. Performed at [facility], [place] [address] [place], [place] [address]   Culture, blood (Routine X 2) w Reflex to ID  Panel     Status: None   Collection Time: 11/27/19  9:29 AM   Specimen: BLOOD  Result Value Ref Range Status   Specimen Description BLOOD LEFT ANTECUBITAL  Final   Special Requests   Final    BOTTLES DRAWN AEROBIC ONLY Blood Culture results may not be optimal due to an excessive volume of blood received in culture bottles   Culture   Final    NO GROWTH 5 DAYS Performed at [facility], [place] [address], [place], [place] [address]    Report Status 12/02/2019 FINAL  Final  Culture, blood (Routine X 2) w Reflex to ID Panel     Status: None   Collection Time: 11/27/19  9:33 AM   Specimen: BLOOD LEFT HAND  Result Value Ref Range Status   Specimen Description BLOOD LEFT HAND  Final   Special Requests   Final    BOTTLES DRAWN AEROBIC ONLY Blood Culture results may not be optimal due to an excessive volume of blood received in culture bottles   Culture   Final    NO GROWTH 5 DAYS Performed at [facility], [place] [address] [place], [place] [address]    Report Status 12/02/2019 FINAL  Final  Body fluid culture (includes gram stain)     Status: None   Collection Time: 11/27/19 12:01 PM   Specimen: Pleural Fluid  Result Value Ref Range Status   Specimen Description PLEURAL  Final   Special Requests NONE  Final   Gram Stain   Final    RARE WBC PRESENT, PREDOMINANTLY PMN NO ORGANISMS SEEN    Culture   Final    NO GROWTH 3 DAYS Performed at [facility], [place] [address] [place], [place] [address]    Report Status 11/30/2019 FINAL  Final  Culture, blood (routine x 2)     Status: None   Collection Time: 11/27/19  2:02 PM   Specimen: BLOOD  Result Value Ref Range Status   Specimen Description BLOOD LEFT ANTECUBITAL  Final   Special Requests   Final    BOTTLES DRAWN AEROBIC AND ANAEROBIC Blood Culture adequate volume   Culture   Final    NO GROWTH 5 DAYS Performed at [facility], [place] [address] [place], [place] [address]    Report Status 12/02/2019 FINAL  Final   Culture, blood (routine x 2)     Status: None   Collection Time: 11/27/19  2:05 PM   Specimen: BLOOD LEFT HAND  Result Value Ref Range Status   Specimen Description BLOOD LEFT HAND  Final   Special Requests   Final  BOTTLES DRAWN AEROBIC AND ANAEROBIC Blood Culture adequate volume   Culture   Final    NO GROWTH 5 DAYS Performed at [facility], [place] [address] [place], [place] [address]    Report Status 12/02/2019 FINAL  Final  Culture, blood (Routine X 2) w Reflex to ID Panel     Status: None   Collection Time: 11/30/19 10:46 AM   Specimen: BLOOD  Result Value Ref Range Status   Specimen Description BLOOD RIGHT ANTECUBITAL  Final   Special Requests   Final    BOTTLES DRAWN AEROBIC AND ANAEROBIC Blood Culture adequate volume   Culture   Final    NO GROWTH 5 DAYS Performed at [facility], [place] [address] [place], [place] [address]    Report Status 12/05/2019 FINAL  Final  Culture, blood (Routine X 2) w Reflex to ID Panel     Status: None   Collection Time: 11/30/19 10:54 AM   Specimen: BLOOD  Result Value Ref Range Status   Specimen Description BLOOD LEFT ANTECUBITAL  Final   Special Requests   Final    BOTTLES DRAWN AEROBIC AND ANAEROBIC Blood Culture adequate volume   Culture   Final    NO GROWTH 5 DAYS Performed at [facility], [place] [address] [place], [place] [address]    Report Status 12/05/2019 FINAL  Final     Patient was seen and examined on the day of discharge and was found to be in stable condition. Time coordinating discharge: 45 minutes including assessment and coordination of care, as well as examination of the patient.   SIGNED:  Dessa Phi, DO [facility] 12/05/2019, 12:36 PM

## 2019-12-06 ENCOUNTER — Other Ambulatory Visit: Payer: Self-pay

## 2019-12-06 LAB — GLUCOSE, CAPILLARY: Glucose-Capillary: 99 mg/dL (ref 70–99)

## 2019-12-06 LAB — PROTIME-INR
INR: 2.8 — ABNORMAL HIGH (ref 0.8–1.2)
Prothrombin Time: 29.1 seconds — ABNORMAL HIGH (ref 11.4–15.2)

## 2019-12-06 NOTE — TOC Progression Note (Addendum)
Transition of Care [facility]) - Progression Note    Patient Details  Name: [name] MRN: [id number] Date of Birth: [birth year]  Transition of Care [facility]) CM/SW Contact  Zenon Mayo, RN Phone Number: 12/06/2019, 8:39 AM  Clinical Narrative:    NCM contacted Ashly with [employer] to see why oxygen was not here, he states he will have oxygen here this am, patient will be the first delivery. NCM notified Pam Chadler and Malachy Mood with [facility], [facility] willl send [facility] out in the am to see patient, he has received his am iv abx dose and wife will do the the evening doses.   Expected Discharge Plan: [place] Barriers to Discharge: No Barriers Identified  Expected Discharge Plan and Services Expected Discharge Plan: [place]   Discharge Planning Services: CM Consult   Living arrangements for the past 2 months: Single Family Home Expected Discharge Date: 12/05/19               DME Arranged: (NA)         HH Arranged: RN, PT, OT HH Agency: [place] Date [facility] Agency Contacted: 11/30/19 Time HH Agency Contacted: 1500 Representative spoke with at [place]: [place] ([place]) Interventions    Readmission Risk Interventions Readmission Risk Prevention Plan 11/30/2019 05/03/2019 01/13/2019  Transportation Screening Complete Complete -  [place] or Home Care Consult Complete Complete -  Social Work Consult for [place] Planning/Counseling Complete Complete -  Palliative Care Screening Not Applicable Not Applicable -  Medication Review Press photographer) Complete Complete Complete  PCP or Specialist appointment within 3-5 days of discharge - - Complete  HRI or [place] recent data might be hidden

## 2019-12-06 NOTE — Progress Notes (Signed)
  PROGRESS NOTE  Patient seen this morning. DC held overnight due to DME O2 delivery issues. Stable for DC this morning.   Dessa Phi, DO [facility] 12/06/2019, 9:59 AM  Available via Epic secure chat 7am-7pm After these hours, please refer to coverage provider listed on [email or web address]

## 2019-12-06 NOTE — Patient Outreach (Signed)
  [facility]) Care Management Chronic Special Needs Program  12/06/2019  Name: [name] DOB: 1934-04-09  MRN: [id number]  Mr. Jourdyn Spaw is enrolled in a chronic special needs plan for Heart Failurehistoroy of HTN, HLD, ischemic cardiomyopathy, atiral fibrillation, COPD, hypothyroidism, MVI 3 days prior to admission. Client presented to the hospital on 11/26/2019 with complaint of shortness of breath. on 11/26/2019. Admitted to hospital with acute on chronic respiratory failure: multifocal including COPD, possible pneumonia and CHF. Problem include: MSSA. Bacteremia; Thoracentesis on 11/27/2019; acute on chronic systolic CHF; hypotension. Client discharged to home on 12/15/2019 with [facility] home health RN,PT,OT,ST; home O2, 6 week course of IV ancef until 3/15/PICC line placed 12/01/2019.    Goals Addressed            This Visit's Progress   . General - Client will not be readmitted within 30 days (C-SNP)-discharged 12/05/2019       Please follow discharge instructions and call provider if you have any questions. Please attend all follow up appointments as scheduled. Please take your medications as prescribed. Please call 24 hour nurse advice line as needed. [phone]). Please participate in home health therapy with [facility] as scheduled.       Transition of care call per EMMI general discharge. RNCM will address any red flags as indicated.  Plan: RNCM will follow up in one month. RNCM will update care plan and send to primary care provider and send care plan to client.    Thea Silversmith, RN, MSN, [place] [phone]   .

## 2019-12-10 ENCOUNTER — Telehealth: Payer: Self-pay

## 2019-12-10 ENCOUNTER — Ambulatory Visit (INDEPENDENT_AMBULATORY_CARE_PROVIDER_SITE_OTHER): Payer: HMO | Admitting: *Deleted

## 2019-12-10 ENCOUNTER — Ambulatory Visit (INDEPENDENT_AMBULATORY_CARE_PROVIDER_SITE_OTHER): Payer: HMO

## 2019-12-10 DIAGNOSIS — Z95810 Presence of automatic (implantable) cardiac defibrillator: Secondary | ICD-10-CM

## 2019-12-10 DIAGNOSIS — I5022 Chronic systolic (congestive) heart failure: Secondary | ICD-10-CM

## 2019-12-10 NOTE — Progress Notes (Signed)
EPIC Encounter for ICM Monitoring  Patient Name: [name] is a 86 y.o. male Date: 12/10/2019 Primary Care Physican: [name], MD Primary [place] Electrophysiologist: Allred BiV PPM: 99.1% 12/10/2019 Weight: 186-187lbs   Spoke with wife and patients legs are very swollen.  He is currently on oxygen at home since being discharged from the hospital on 2/10 after having dx of pneumonia and fluid accumulation.         Thoracentesis procedure was done during hospitalization on 2/2 and 1.2 L of fluid drained per discharge note.        Weight after being diuresed in the hospital was 182 lbs but unsure of weight at time of discharge.  Weight stable at home.   OptivolThoracic impedancesuggesting fluid accumulation since 11/20/2019 which correlates recent hospitalizations.   Prescribed: Furosemide40 mg take1 tablettwice a day. Confirmed on 2/15 that patient has been taking prescribed dosage since hospital discharge. Potassium 20 mEq take 1 tablet daily.  Labs: 12/05/2019 Creatinine 1.01, BUN 12, Potassium 4.3, Sodium 136, GFR >60 12/04/2019 Creatinine 0.97, BUN 10, Potassium 4.3, Sodium 137, GFR >60  12/03/2019 Creatinine 0.96, BUN 16, Potassium 4.2, Sodium 138, GFR >60  12/01/2018 Creatinine 1.08, BUN 20, Potassium 5.1, Sodium 142, GFR >60  11/30/2018 Creatinine 1.16, BUN 27, Potassium 3.8, Sodium 138, GFR > 60 11/29/2018 Creatinine 1.29, BUN 35, Potassium 4.2, Sodium 138, GFR 51-59  11/28/2018 Creatinine 1.42, BUN 37, Potassium 3.9, Sodium 137, GFR 45-52  11/27/2018 Creatinine 1.43, BUN 37, Potassium 3.6, Sodium 137, GFR 45-52  11/27/2019 Creatinine 1.32, BUN 34, Potassium 4.4, Sodium 140, GFR 49-57 11/26/2019 Creatinine 1.54, BUN 36, Potassium 5.4, Sodium 137, GFR 41-48  11/21/2019 Creatinine 1.40, BUN 15, Potassium 4.3, Sodium 137, GFR 46-53  07/25/2019 Creatinine1.20, BUN15, Potassium4.3, Sodium140, GFR56->60 Acomplete set  of results can be found in Results Review.  Follow-up plan: ICM clinic phone appointment on 12/17/2019 to recheck fluid levels.   Office appt 01/14/2020 with [name].  PCP visit on 12/12/2019.  Recommendations:   Phone note to Dr Irish Lack for review and recommendations.   Copy of ICM check sent to Dr. Rayann Heman and phone note to Dr Irish Lack for review and recommendations.   3 month ICM trend: 12/10/2019    1 Year ICM trend:       Rosalene Billings, RN 12/10/2019 4:11 PM

## 2019-12-10 NOTE — Telephone Encounter (Signed)
Incrase Lasix to 80 mg BID for 4 days.  Then back to 40 mg BID.  BMet next week.

## 2019-12-10 NOTE — Telephone Encounter (Signed)
ICM call to wife and patients legs are very swollen.  He is currently on oxygen at home since being discharged from the hospital on 2/10 after having dx of pneumonia and fluid accumulation.          Thoracentesis procedure was done during hospitalization on 2/2 and 1.2 L of fluid drained per discharge note.  Weight after being diuresed in the hospital was 182 lbs but unsure of weight at time of discharge.  Weight stable at home.   OptivolThoracic impedancesuggesting fluid accumulation since 11/20/2019 which correlates recent hospitalizations.   Prescribed: Furosemide40 mg take1 tablettwice a day. Confirmed on 2/15 that patient has been taking prescribed dosage since hospital discharge. Potassium 20 mEq take 1 tablet daily.  Labs: 12/05/2019 Creatinine 1.01, BUN 12, Potassium 4.3, Sodium 136, GFR >60 12/04/2019 Creatinine 0.97, BUN 10, Potassium 4.3, Sodium 137, GFR >60  12/03/2019 Creatinine 0.96, BUN 16, Potassium 4.2, Sodium 138, GFR >60  12/01/2018 Creatinine 1.08, BUN 20, Potassium 5.1, Sodium 142, GFR >60  11/30/2018 Creatinine 1.16, BUN 27, Potassium 3.8, Sodium 138, GFR > 60 11/29/2018 Creatinine 1.29, BUN 35, Potassium 4.2, Sodium 138, GFR 51-59  11/28/2018 Creatinine 1.42, BUN 37, Potassium 3.9, Sodium 137, GFR 45-52  11/27/2018 Creatinine 1.43, BUN 37, Potassium 3.6, Sodium 137, GFR 45-52  11/27/2019 Creatinine 1.32, BUN 34, Potassium 4.4, Sodium 140, GFR 49-57 11/26/2019 Creatinine 1.54, BUN 36, Potassium 5.4, Sodium 137, GFR 41-48   Follow-up plan: ICM clinic phone appointment on 12/17/2019 to recheck fluid levels.   Office appt 01/14/2020 with [name].  PCP visit on 12/12/2019.  Recommendations:   Phone note to Dr Irish Lack for review and recommendations.   Copy of ICM check sent to Dr. Rayann Heman and phone note to Dr Irish Lack for review and recommendations.   3 month ICM trend: 12/10/2019    1 Year ICM trend:

## 2019-12-11 ENCOUNTER — Encounter: Payer: Self-pay | Admitting: Interventional Cardiology

## 2019-12-11 ENCOUNTER — Telehealth: Payer: Self-pay | Admitting: Interventional Cardiology

## 2019-12-11 DIAGNOSIS — E039 Hypothyroidism, unspecified: Secondary | ICD-10-CM | POA: Diagnosis not present

## 2019-12-11 DIAGNOSIS — J9621 Acute and chronic respiratory failure with hypoxia: Secondary | ICD-10-CM | POA: Diagnosis not present

## 2019-12-11 DIAGNOSIS — I5023 Acute on chronic systolic (congestive) heart failure: Secondary | ICD-10-CM | POA: Diagnosis not present

## 2019-12-11 DIAGNOSIS — E1151 Type 2 diabetes mellitus with diabetic peripheral angiopathy without gangrene: Secondary | ICD-10-CM | POA: Diagnosis not present

## 2019-12-11 DIAGNOSIS — I130 Hypertensive heart and chronic kidney disease with heart failure and stage 1 through stage 4 chronic kidney disease, or unspecified chronic kidney disease: Secondary | ICD-10-CM | POA: Diagnosis not present

## 2019-12-11 LAB — CUP PACEART REMOTE DEVICE CHECK
Battery Remaining Longevity: 158 mo
Battery Voltage: 3.17 V
Brady Statistic AP VP Percent: 0.46 %
Brady Statistic AP VS Percent: 0.01 %
Brady Statistic AS VP Percent: 99.21 %
Brady Statistic AS VS Percent: 0.32 %
Brady Statistic RA Percent Paced: 0.53 %
Brady Statistic RV Percent Paced: 99.66 %
Date Time Interrogation Session: 20210215042140
Implantable Lead Implant Date: 20091215
Implantable Lead Implant Date: 20091215
Implantable Lead Implant Date: 20091215
Implantable Lead Location: 753858
Implantable Lead Location: 753859
Implantable Lead Location: 753860
Implantable Lead Model: 4196
Implantable Lead Model: 5076
Implantable Lead Model: 6947
Implantable Pulse Generator Implant Date: 20150113
Lead Channel Impedance Value: 266 Ohm
Lead Channel Impedance Value: 266 Ohm
Lead Channel Impedance Value: 380 Ohm
Lead Channel Impedance Value: 399 Ohm
Lead Channel Impedance Value: 513 Ohm
Lead Channel Impedance Value: 513 Ohm
Lead Channel Impedance Value: 551 Ohm
Lead Channel Impedance Value: 627 Ohm
Lead Channel Impedance Value: 627 Ohm
Lead Channel Pacing Threshold Amplitude: 0.625 V
Lead Channel Pacing Threshold Amplitude: 0.875 V
Lead Channel Pacing Threshold Amplitude: 0.875 V
Lead Channel Pacing Threshold Pulse Width: 0.4 ms
Lead Channel Pacing Threshold Pulse Width: 0.4 ms
Lead Channel Pacing Threshold Pulse Width: 0.4 ms
Lead Channel Sensing Intrinsic Amplitude: 0.625 mV
Lead Channel Sensing Intrinsic Amplitude: 0.625 mV
Lead Channel Sensing Intrinsic Amplitude: 12.5 mV
Lead Channel Sensing Intrinsic Amplitude: 12.5 mV
Lead Channel Setting Pacing Amplitude: 0.5 V
Lead Channel Setting Pacing Amplitude: 1.25 V
Lead Channel Setting Pacing Amplitude: 1.75 V
Lead Channel Setting Pacing Pulse Width: 0.03 ms
Lead Channel Setting Pacing Pulse Width: 0.4 ms
Lead Channel Setting Sensing Sensitivity: 0.9 mV

## 2019-12-11 LAB — POCT INR: INR: 5.2 — AB (ref 2.0–3.0)

## 2019-12-11 NOTE — Telephone Encounter (Signed)
[place] health nurse would like the ok to check patient's PT/INR

## 2019-12-11 NOTE — Progress Notes (Signed)
ICD remote 

## 2019-12-11 NOTE — Telephone Encounter (Signed)
Called and spoke to [place] from [facility], she was at pt's home to get labs and informed her to get pt's PT/INR while she was out there and to fax results to the [address] office  [phone] and to the [place] office [phone]. She stated that the best number for her to be reached at is [phone].

## 2019-12-11 NOTE — Telephone Encounter (Signed)
Left message for patient to call back  

## 2019-12-12 ENCOUNTER — Ambulatory Visit (INDEPENDENT_AMBULATORY_CARE_PROVIDER_SITE_OTHER): Payer: HMO

## 2019-12-12 ENCOUNTER — Telehealth: Payer: Self-pay

## 2019-12-12 DIAGNOSIS — B9561 Methicillin susceptible Staphylococcus aureus infection as the cause of diseases classified elsewhere: Secondary | ICD-10-CM | POA: Diagnosis not present

## 2019-12-12 DIAGNOSIS — I480 Paroxysmal atrial fibrillation: Secondary | ICD-10-CM | POA: Diagnosis not present

## 2019-12-12 DIAGNOSIS — Z7901 Long term (current) use of anticoagulants: Secondary | ICD-10-CM | POA: Diagnosis not present

## 2019-12-12 DIAGNOSIS — R7881 Bacteremia: Secondary | ICD-10-CM | POA: Diagnosis not present

## 2019-12-12 DIAGNOSIS — J189 Pneumonia, unspecified organism: Secondary | ICD-10-CM | POA: Diagnosis not present

## 2019-12-12 DIAGNOSIS — Z5181 Encounter for therapeutic drug level monitoring: Secondary | ICD-10-CM | POA: Diagnosis not present

## 2019-12-12 DIAGNOSIS — I5022 Chronic systolic (congestive) heart failure: Secondary | ICD-10-CM | POA: Diagnosis not present

## 2019-12-12 DIAGNOSIS — E1122 Type 2 diabetes mellitus with diabetic chronic kidney disease: Secondary | ICD-10-CM | POA: Diagnosis not present

## 2019-12-12 NOTE — Telephone Encounter (Signed)
Please see anti-coag note for today. Received fax and spoke w/ pt's wife this am.

## 2019-12-12 NOTE — Patient Instructions (Signed)
Spoke w/ pt's wife. Advised her to have pt hold warfarin today & tomorrow, then START NEW DOSAGE of 1/2 tablet daily until recheck since pt is on abx for sepsis and is not eating well.  Will have [facility] nurse recheck next Monday and call w/ results.

## 2019-12-12 NOTE — Telephone Encounter (Signed)
Patient wife returning call  °

## 2019-12-12 NOTE — Telephone Encounter (Signed)
Home Health nurse calling in INR value INR 5.2  PT 49.1  Nurse will fax results as well

## 2019-12-12 NOTE — Progress Notes (Signed)
Jettie Booze, MD to Drue Novel I, RN    7:30 PM Note   Incrase Lasix to 80 mg BID for 4 days.  Then back to 40 mg BID.  BMet next week.

## 2019-12-12 NOTE — Telephone Encounter (Signed)
Left message to call back  

## 2019-12-12 NOTE — Telephone Encounter (Signed)
Left another message for patient to call back 

## 2019-12-12 NOTE — Telephone Encounter (Signed)
Spoke w/ pt's wife.  She states that she was in another appt when I called and could not answered. Advised her that I spoke w/ her this am and have not called since that time. Advised her, thought, that since that call, I spoke w/ [facility] and they will check pt's INR at his home on 2/22 and call me w/ results. She is appreciative of the call.

## 2019-12-12 NOTE — Progress Notes (Signed)
Dr Hassell Done nurse, Drue Novel, has attempted to contact patient/wife x 2 to provide recommendations.

## 2019-12-14 ENCOUNTER — Other Ambulatory Visit: Payer: Self-pay | Admitting: Pharmacist

## 2019-12-14 ENCOUNTER — Ambulatory Visit: Payer: Self-pay | Admitting: Pharmacist
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
## 2019-12-14 NOTE — Progress Notes (Signed)
Dr Hassell Done nurse, Drue Novel, tried 3rd attempt to contact patient/wife to provide recommendations.

## 2019-12-17 ENCOUNTER — Ambulatory Visit (INDEPENDENT_AMBULATORY_CARE_PROVIDER_SITE_OTHER): Payer: HMO

## 2019-12-17 ENCOUNTER — Telehealth: Payer: Self-pay

## 2019-12-17 DIAGNOSIS — Z95810 Presence of automatic (implantable) cardiac defibrillator: Secondary | ICD-10-CM

## 2019-12-17 DIAGNOSIS — Z7901 Long term (current) use of anticoagulants: Secondary | ICD-10-CM

## 2019-12-17 DIAGNOSIS — Z5181 Encounter for therapeutic drug level monitoring: Secondary | ICD-10-CM | POA: Diagnosis not present

## 2019-12-17 DIAGNOSIS — B9561 Methicillin susceptible Staphylococcus aureus infection as the cause of diseases classified elsewhere: Secondary | ICD-10-CM | POA: Diagnosis not present

## 2019-12-17 DIAGNOSIS — I480 Paroxysmal atrial fibrillation: Secondary | ICD-10-CM

## 2019-12-17 DIAGNOSIS — I5022 Chronic systolic (congestive) heart failure: Secondary | ICD-10-CM

## 2019-12-17 LAB — POCT INR: INR: 3.5 — AB (ref 2.0–3.0)

## 2019-12-17 NOTE — Telephone Encounter (Signed)
Remote ICM transmission received.  Attempted call to wife regarding ICM remote transmission and left detailed message per DPR.  Advised to return call for any fluid symptoms or questions.    

## 2019-12-17 NOTE — Patient Instructions (Signed)
Spoke w/ Maxie Barb, [place] nurse w/ [facility]. Advised her to have pt hold warfarin today and continue dosage of 1/2 tablet daily until recheck since pt is on abx for sepsis and is not eating well.  Recheck next Monday.

## 2019-12-17 NOTE — Addendum Note (Signed)
Addended by: Drue Novel I on: 12/17/2019 01:56 PM   Modules accepted: Orders

## 2019-12-17 NOTE — Telephone Encounter (Signed)
Per Margarita Grizzle patient's report today still showing increased fluid. Called and spoke to patient's wife. Instructed for patient to increase lasix to 80 mg BID x 4 days and then go back to 40 mg BID. Patient will come in for BMET on 3/3. Wife verbalized understanding and thanked me for the call.

## 2019-12-17 NOTE — Progress Notes (Signed)
EPIC Encounter for ICM Monitoring  Patient Name: [name] is a 86 y.o. male Date: 12/17/2019 Primary Care Physican: [name], MD Primary [place] Electrophysiologist: Allred BiV PPM: 99.7% 12/10/2019 Weight:  186-187lbs    Attempted call to wife and unable to reach.  Left detailed message per DPR regarding transmission. Transmission reviewed.                OptivolThoracic impedancesuggesting fluid accumulation since 11/20/2019 which correlates recent hospitalizations.   Prescribed: Furosemide40 mg take1 tablettwice a day. Confirmed on 2/15 that patient has been taking prescribed dosage since hospital discharge. Potassium 20 mEq take 1 tablet daily.  Labs: 12/05/2019 Creatinine 1.01, BUN 12, Potassium 4.3, Sodium 136, GFR >60 12/04/2019 Creatinine 0.97, BUN 10, Potassium 4.3, Sodium 137, GFR >60  12/03/2019 Creatinine 0.96, BUN 16, Potassium 4.2, Sodium 138, GFR >60  12/01/2018 Creatinine 1.08, BUN 20, Potassium 5.1, Sodium 142, GFR >60  11/30/2018 Creatinine 1.16, BUN 27, Potassium 3.8, Sodium 138, GFR > 60 11/29/2018 Creatinine 1.29, BUN 35, Potassium 4.2, Sodium 138, GFR 51-59  11/28/2018 Creatinine 1.42, BUN 37, Potassium 3.9, Sodium 137, GFR 45-52  11/27/2018 Creatinine 1.43, BUN 37, Potassium 3.6, Sodium 137, GFR 45-52  11/27/2019 Creatinine1.32, BUN34, Potassium4.4, Sodium140, GFR49-57 11/26/2019 Creatinine1.54, BUN36, Potassium5.4, [id number], [id number]  11/21/2019 Creatinine1.40, BUN15, Potassium4.3, [id number], [id number] Acomplete set of results can be found in Results Review.  Follow-up plan: ICM clinic phone appointment on 12/24/2019 to recheck fluid levels.   91 day device clinic remote transmission 03/10/2020.  Office appt 01/14/2020 with [name].         Recommendations: Dr. Hebert Soho nurse Drue Novel, RN spoke with wife today, 2/22 and provided Dr Hassell Done instructions to increase Lasix to 80 mg  bid x 4 days with BMET in a week.  (see 2/15 ICM phone note with instructions).  Copy of ICM check sent to Dr. Rayann Heman.   3 month ICM trend: 12/17/2019    1 Year ICM trend:       Rosalene Billings, RN 12/17/2019 1:17 PM

## 2019-12-20 DIAGNOSIS — B9561 Methicillin susceptible Staphylococcus aureus infection as the cause of diseases classified elsewhere: Secondary | ICD-10-CM | POA: Diagnosis not present

## 2019-12-20 DIAGNOSIS — R7881 Bacteremia: Secondary | ICD-10-CM | POA: Diagnosis not present

## 2019-12-24 ENCOUNTER — Telehealth: Payer: Self-pay

## 2019-12-24 ENCOUNTER — Ambulatory Visit (INDEPENDENT_AMBULATORY_CARE_PROVIDER_SITE_OTHER): Payer: HMO

## 2019-12-24 DIAGNOSIS — I5022 Chronic systolic (congestive) heart failure: Secondary | ICD-10-CM

## 2019-12-24 DIAGNOSIS — J439 Emphysema, unspecified: Secondary | ICD-10-CM | POA: Diagnosis not present

## 2019-12-24 DIAGNOSIS — Z95810 Presence of automatic (implantable) cardiac defibrillator: Secondary | ICD-10-CM

## 2019-12-24 NOTE — Progress Notes (Signed)
EPIC Encounter for ICM Monitoring  Patient Name: [name] is a 86 y.o. male Date: 12/24/2019 Primary Care Physican: [name], MD Primary [place] Electrophysiologist: Allred BiV PPM: 99.4% 2/15/2021Weight: 186-187lbs 12/24/2019   Weight: 190 lbs    Spoke with wife.  She reports patient is feeling better but weight is still up 4 lbs from baseline, ankles are still swollen but has improved, breathing at baseline on oxygen.               OptivolThoracic impedanceshows no significant change after taking Lasix to 80 mg bid x 4 days (2/22) and continues to suggest fluid accumulation since 11/20/2019.  Prescribed: Furosemide40 mg take1 tablettwice a day.  Potassium 20 mEq take 1 tablet daily.  Labs: 12/05/2019 Creatinine1.01, BUN12, Potassium4.3, Sodium136, GFR>60 12/04/2019 Creatinine0.97, BUN10, Potassium4.3, Sodium137, GFR>60  12/03/2019 Creatinine0.96, BUN16, Potassium4.2, Sodium138, GFR>60  12/01/2018 Creatinine1.08, BUN20, Potassium5.1, Sodium142, GFR>60  11/30/2018 Creatinine1.16, BUN27, Potassium3.8, Sodium138, GFR> 60 11/29/2018 Creatinine1.29, BUN35, Potassium4.2, [id number], [id number]  11/28/2018 Creatinine1.42, BUN37, Potassium3.9, [id number], [id number]  11/27/2018 Creatinine1.43, BUN37, Potassium3.6, [id number], [id number] 11/27/2019 Creatinine1.32, BUN34, Potassium4.4, Sodium140, [id number] 11/26/2019 Creatinine1.54, BUN36, Potassium5.4, [id number], [id number]  11/21/2019 Creatinine1.40, BUN15, Potassium4.3, [id number], [id number] Acomplete set of results can be found in Results Review.        Recommendations:  Sent in a phone note to Dr Irish Lack for review/recommendations.    Follow-up plan: ICM clinic phone appointment on3/05/2020 to recheck fluid levels. 91 day device clinic remote transmission 03/10/2020.  Office appt 01/14/2020 with Dr.Varanasi.         Copy of ICM check sent  to Dr. Rayann Heman.   3 month ICM trend: 12/24/2019    1 Year ICM trend:       Rosalene Billings, RN 12/24/2019 1:10 PM

## 2019-12-24 NOTE — Telephone Encounter (Signed)
Spoke with wife.  She reports patient is feeling better but weight is still up 4 lbs from baseline, ankles are swollen but improved, breathing at baseline on oxygen.               OptivolThoracic impedanceshows no significant change after taking Lasix to 80 mg bid x 4 days (2/22) and         continues to suggest fluid accumulation since 11/20/2019.       2/15/2021Weight: 186-187lbs      12/24/2019   Weight: 190 lbs  Prescribed: Furosemide40 mg take1 tablettwice a day.  Potassium   20 mEq take 1 tablet daily.  Labs:  BMET scheduled for 3/3 12/05/2019 Creatinine1.01, BUN12, Potassium4.3, Sodium136, GFR>60 12/04/2019 Creatinine0.97, BUN10, Potassium4.3, Sodium137, GFR>60  12/03/2019 Creatinine0.96, BUN16, Potassium4.2, Sodium138, GFR>60  12/01/2018 Creatinine1.08, BUN20, Potassium5.1, Sodium142, GFR>60  11/30/2018 Creatinine1.16, BUN27, Potassium3.8, Sodium138, GFR> 60 11/29/2018 Creatinine1.29, BUN35, Potassium4.2, [id number], GFR51-59  11/28/2018 Creatinine1.42, BUN37, Potassium3.9, [id number], [id number]  11/27/2018 Creatinine1.43, BUN37, Potassium3.6, [place]:[id number], [id number] 11/27/2019 Creatinine1.32, BUN34, Potassium4.4, Sodium140, [id number] 11/26/2019 Creatinine1.54, BUN36, Potassium5.4, [place]:[id number], [id number]  11/21/2019 Creatinine1.40, BUN15, Potassium4.3, [place]:[id number], [id number] Acomplete set of results can be found in Results Review.        Sent to Dr Irish Lack for review/recommendations.    Follow-up plan: ICM clinic phone appointment on3/05/2020 to recheck fluid levels.  Office appt 01/14/2020 with Dr.Varanasi.        3 month ICM trend: 12/24/2019    1 Year ICM trend:

## 2019-12-25 MED ORDER — FUROSEMIDE 80 MG PO TABS: 80.0000 mg | ORAL_TABLET | Freq: Two times a day (BID) | ORAL | 3 refills | Status: DC

## 2019-12-25 MED ORDER — FUROSEMIDE 80 MG PO TABS: 80.0000 mg | ORAL_TABLET | Freq: Two times a day (BID) | ORAL | 3 refills | Status: AC

## 2019-12-25 NOTE — Telephone Encounter (Signed)
Increase Lasix to 80 mg BID and can we change BMet to Friday, 3/5.  THanks.  JV

## 2019-12-25 NOTE — Telephone Encounter (Signed)
Called and spoke to patient's wife (DPR on file) and made her aware of Dr. Hassell Done recommendations to increase lasix to 80 mg BID and change BMET to be done on Friday. She verbalized understanding and thanked me for the call.

## 2019-12-25 NOTE — Addendum Note (Signed)
Addended by: Drue Novel I on: 12/25/2019 02:08 PM   Modules accepted: Orders

## 2019-12-26 ENCOUNTER — Other Ambulatory Visit: Payer: HMO

## 2019-12-26 NOTE — Progress Notes (Signed)
Drue Novel I, RN 1:59 PM Note   Called and spoke to patient's wife (DPR on file) and made her aware of Dr. Hassell Done recommendations to increase lasix to 80 mg BID and change BMET to be done on Friday. She verbalized understanding and thanked me for the call.       Jettie Booze, MD to Drue Novel I, RN      1:04 PM Note   Increase Lasix to 80 mg BID and can we change BMet to Friday, 3/5.  THanks.

## 2019-12-27 ENCOUNTER — Telehealth: Payer: Self-pay | Admitting: Pulmonary Disease

## 2019-12-27 NOTE — Telephone Encounter (Signed)
Message routed to [name] Form, NP as she was the last provider to see the patient in clinic.  [name], based on the information below please advise if an order should be placed to [facility] for his CPAP supplies and a mask fit, or if he needs to have a televisit first. Patient last appointment was 06/13/19.  Called and spoke to the patient's wife [name]).She stated that patient has had problems with the fit of his face mask for a long time. He has had a mask fitting and made Dr. Halford Chessman aware of the mask problem the last time he saw him.  He does get his O2 from [facility] and the rep who comes to their home said they can take over his CPAP needs.  They are not happy with [facility] as they are only getting care by phone and the location near their home has closed. [name] stated he received the last mask from [facility] in September or October and it is loose and does not fit right. He also uses 3L O2 day and night.  He had a fall back in January and was in the hospital, he has been in therapy since that time and noticed when he started therapy his O2 would drop when he stands up.  Patient is not able to come into the clinic for an OV as he is fighting sepsis right now.  I offered a 9:30 televisit for 12/28/19 but [name] said she was still waiting on the nurse to call to advise when she is coming tomorrow for his therapy. He has an doctors appointment in the afternoon as well.

## 2019-12-28 ENCOUNTER — Other Ambulatory Visit: Payer: Self-pay

## 2019-12-28 ENCOUNTER — Other Ambulatory Visit: Payer: HMO | Admitting: *Deleted

## 2019-12-28 DIAGNOSIS — I5022 Chronic systolic (congestive) heart failure: Secondary | ICD-10-CM | POA: Diagnosis not present

## 2019-12-28 DIAGNOSIS — B9561 Methicillin susceptible Staphylococcus aureus infection as the cause of diseases classified elsewhere: Secondary | ICD-10-CM | POA: Diagnosis not present

## 2019-12-28 DIAGNOSIS — R7881 Bacteremia: Secondary | ICD-10-CM | POA: Diagnosis not present

## 2019-12-28 LAB — BASIC METABOLIC PANEL
BUN/Creatinine Ratio: 15 (ref 10–24)
BUN: 22 mg/dL (ref 8–27)
CO2: 32 mmol/L — ABNORMAL HIGH (ref 20–29)
Calcium: 8.6 mg/dL (ref 8.6–10.2)
Chloride: 95 mmol/L — ABNORMAL LOW (ref 96–106)
Creatinine, Ser: 1.49 mg/dL — ABNORMAL HIGH (ref 0.76–1.27)
GFR calc Af Amer: 49 mL/min/{1.73_m2} — ABNORMAL LOW (ref >59)
GFR calc non Af Amer: 42 mL/min/{1.73_m2} — ABNORMAL LOW (ref >59)
Glucose: 132 mg/dL — ABNORMAL HIGH (ref 65–99)
Potassium: 5 mmol/L (ref 3.5–5.2)
Sodium: 139 mmol/L (ref 134–144)

## 2019-12-28 NOTE — Telephone Encounter (Signed)
Called and spoke with pt's wife [name] stating to her that SG wanted to see if we could schedule a televisit Monday for pt and she stated that was fine. Pt has been scheduled a televisit Monday 3/8 at 12pm with Judson Roch. Nothing further needed.

## 2019-12-28 NOTE — Telephone Encounter (Signed)
See if they can do a tele visit Monday. Thanks

## 2019-12-31 ENCOUNTER — Inpatient Hospital Stay (HOSPITAL_COMMUNITY)
Admission: EM | Admit: 2019-12-31 | Discharge: 2020-01-24 | DRG: 291 | Disposition: E | Payer: HMO | Attending: Student | Admitting: Student

## 2019-12-31 ENCOUNTER — Encounter (HOSPITAL_COMMUNITY): Payer: Self-pay

## 2019-12-31 ENCOUNTER — Ambulatory Visit (INDEPENDENT_AMBULATORY_CARE_PROVIDER_SITE_OTHER): Payer: HMO

## 2019-12-31 ENCOUNTER — Encounter: Payer: Self-pay | Admitting: Acute Care

## 2019-12-31 ENCOUNTER — Ambulatory Visit: Payer: HMO | Admitting: Acute Care

## 2019-12-31 ENCOUNTER — Telehealth: Payer: Self-pay

## 2019-12-31 ENCOUNTER — Other Ambulatory Visit: Payer: Self-pay

## 2019-12-31 VITALS — BP 102/54 | HR 64 | Temp 98.1°F

## 2019-12-31 DIAGNOSIS — I5023 Acute on chronic systolic (congestive) heart failure: Secondary | ICD-10-CM | POA: Diagnosis present

## 2019-12-31 DIAGNOSIS — R0689 Other abnormalities of breathing: Secondary | ICD-10-CM | POA: Diagnosis not present

## 2019-12-31 DIAGNOSIS — Z951 Presence of aortocoronary bypass graft: Secondary | ICD-10-CM

## 2019-12-31 DIAGNOSIS — J9621 Acute and chronic respiratory failure with hypoxia: Secondary | ICD-10-CM | POA: Diagnosis present

## 2019-12-31 DIAGNOSIS — Z87442 Personal history of urinary calculi: Secondary | ICD-10-CM

## 2019-12-31 DIAGNOSIS — Z95810 Presence of automatic (implantable) cardiac defibrillator: Secondary | ICD-10-CM

## 2019-12-31 DIAGNOSIS — N179 Acute kidney failure, unspecified: Secondary | ICD-10-CM | POA: Diagnosis not present

## 2019-12-31 DIAGNOSIS — N390 Urinary tract infection, site not specified: Secondary | ICD-10-CM | POA: Diagnosis present

## 2019-12-31 DIAGNOSIS — I420 Dilated cardiomyopathy: Secondary | ICD-10-CM | POA: Diagnosis present

## 2019-12-31 DIAGNOSIS — B965 Pseudomonas (aeruginosa) (mallei) (pseudomallei) as the cause of diseases classified elsewhere: Secondary | ICD-10-CM | POA: Diagnosis present

## 2019-12-31 DIAGNOSIS — Z7901 Long term (current) use of anticoagulants: Secondary | ICD-10-CM

## 2019-12-31 DIAGNOSIS — J441 Chronic obstructive pulmonary disease with (acute) exacerbation: Secondary | ICD-10-CM | POA: Diagnosis present

## 2019-12-31 DIAGNOSIS — R4182 Altered mental status, unspecified: Secondary | ICD-10-CM | POA: Diagnosis not present

## 2019-12-31 DIAGNOSIS — Z515 Encounter for palliative care: Secondary | ICD-10-CM

## 2019-12-31 DIAGNOSIS — R609 Edema, unspecified: Secondary | ICD-10-CM | POA: Diagnosis not present

## 2019-12-31 DIAGNOSIS — A4901 Methicillin susceptible Staphylococcus aureus infection, unspecified site: Secondary | ICD-10-CM | POA: Diagnosis not present

## 2019-12-31 DIAGNOSIS — J90 Pleural effusion, not elsewhere classified: Secondary | ICD-10-CM | POA: Diagnosis not present

## 2019-12-31 DIAGNOSIS — J811 Chronic pulmonary edema: Secondary | ICD-10-CM | POA: Diagnosis not present

## 2019-12-31 DIAGNOSIS — Z8249 Family history of ischemic heart disease and other diseases of the circulatory system: Secondary | ICD-10-CM

## 2019-12-31 DIAGNOSIS — R0902 Hypoxemia: Secondary | ICD-10-CM | POA: Diagnosis not present

## 2019-12-31 DIAGNOSIS — Z79890 Hormone replacement therapy (postmenopausal): Secondary | ICD-10-CM

## 2019-12-31 DIAGNOSIS — R0602 Shortness of breath: Secondary | ICD-10-CM

## 2019-12-31 DIAGNOSIS — I959 Hypotension, unspecified: Secondary | ICD-10-CM

## 2019-12-31 DIAGNOSIS — I255 Ischemic cardiomyopathy: Secondary | ICD-10-CM | POA: Diagnosis present

## 2019-12-31 DIAGNOSIS — Z66 Do not resuscitate: Secondary | ICD-10-CM | POA: Diagnosis present

## 2019-12-31 DIAGNOSIS — Z87730 Personal history of (corrected) cleft lip and palate: Secondary | ICD-10-CM

## 2019-12-31 DIAGNOSIS — Z825 Family history of asthma and other chronic lower respiratory diseases: Secondary | ICD-10-CM

## 2019-12-31 DIAGNOSIS — R791 Abnormal coagulation profile: Secondary | ICD-10-CM | POA: Diagnosis present

## 2019-12-31 DIAGNOSIS — N400 Enlarged prostate without lower urinary tract symptoms: Secondary | ICD-10-CM | POA: Diagnosis present

## 2019-12-31 DIAGNOSIS — J9601 Acute respiratory failure with hypoxia: Secondary | ICD-10-CM

## 2019-12-31 DIAGNOSIS — I5022 Chronic systolic (congestive) heart failure: Secondary | ICD-10-CM | POA: Diagnosis not present

## 2019-12-31 DIAGNOSIS — B9561 Methicillin susceptible Staphylococcus aureus infection as the cause of diseases classified elsewhere: Secondary | ICD-10-CM | POA: Diagnosis not present

## 2019-12-31 DIAGNOSIS — J9622 Acute and chronic respiratory failure with hypercapnia: Secondary | ICD-10-CM | POA: Diagnosis present

## 2019-12-31 DIAGNOSIS — E1151 Type 2 diabetes mellitus with diabetic peripheral angiopathy without gangrene: Secondary | ICD-10-CM | POA: Diagnosis present

## 2019-12-31 DIAGNOSIS — R7881 Bacteremia: Secondary | ICD-10-CM | POA: Diagnosis present

## 2019-12-31 DIAGNOSIS — I708 Atherosclerosis of other arteries: Secondary | ICD-10-CM | POA: Diagnosis present

## 2019-12-31 DIAGNOSIS — Z7189 Other specified counseling: Secondary | ICD-10-CM | POA: Diagnosis not present

## 2019-12-31 DIAGNOSIS — E039 Hypothyroidism, unspecified: Secondary | ICD-10-CM | POA: Diagnosis present

## 2019-12-31 DIAGNOSIS — G9341 Metabolic encephalopathy: Secondary | ICD-10-CM | POA: Diagnosis present

## 2019-12-31 DIAGNOSIS — G4733 Obstructive sleep apnea (adult) (pediatric): Secondary | ICD-10-CM | POA: Diagnosis present

## 2019-12-31 DIAGNOSIS — N3000 Acute cystitis without hematuria: Secondary | ICD-10-CM | POA: Diagnosis not present

## 2019-12-31 DIAGNOSIS — Z20822 Contact with and (suspected) exposure to covid-19: Secondary | ICD-10-CM | POA: Diagnosis present

## 2019-12-31 DIAGNOSIS — K219 Gastro-esophageal reflux disease without esophagitis: Secondary | ICD-10-CM | POA: Diagnosis present

## 2019-12-31 DIAGNOSIS — J810 Acute pulmonary edema: Secondary | ICD-10-CM | POA: Diagnosis not present

## 2019-12-31 DIAGNOSIS — I469 Cardiac arrest, cause unspecified: Secondary | ICD-10-CM | POA: Diagnosis not present

## 2019-12-31 DIAGNOSIS — J9811 Atelectasis: Secondary | ICD-10-CM | POA: Diagnosis present

## 2019-12-31 DIAGNOSIS — Z87891 Personal history of nicotine dependence: Secondary | ICD-10-CM

## 2019-12-31 DIAGNOSIS — J15211 Pneumonia due to Methicillin susceptible Staphylococcus aureus: Secondary | ICD-10-CM

## 2019-12-31 DIAGNOSIS — E1165 Type 2 diabetes mellitus with hyperglycemia: Secondary | ICD-10-CM | POA: Diagnosis present

## 2019-12-31 DIAGNOSIS — R5381 Other malaise: Secondary | ICD-10-CM | POA: Diagnosis not present

## 2019-12-31 DIAGNOSIS — Z8673 Personal history of transient ischemic attack (TIA), and cerebral infarction without residual deficits: Secondary | ICD-10-CM

## 2019-12-31 DIAGNOSIS — T380X5A Adverse effect of glucocorticoids and synthetic analogues, initial encounter: Secondary | ICD-10-CM | POA: Diagnosis present

## 2019-12-31 DIAGNOSIS — G9340 Encephalopathy, unspecified: Secondary | ICD-10-CM | POA: Diagnosis not present

## 2019-12-31 DIAGNOSIS — N183 Chronic kidney disease, stage 3 unspecified: Secondary | ICD-10-CM | POA: Diagnosis not present

## 2019-12-31 DIAGNOSIS — I5021 Acute systolic (congestive) heart failure: Secondary | ICD-10-CM | POA: Diagnosis not present

## 2019-12-31 DIAGNOSIS — I480 Paroxysmal atrial fibrillation: Secondary | ICD-10-CM | POA: Diagnosis not present

## 2019-12-31 DIAGNOSIS — I25119 Atherosclerotic heart disease of native coronary artery with unspecified angina pectoris: Secondary | ICD-10-CM | POA: Diagnosis not present

## 2019-12-31 DIAGNOSIS — I110 Hypertensive heart disease with heart failure: Principal | ICD-10-CM | POA: Diagnosis present

## 2019-12-31 DIAGNOSIS — I4819 Other persistent atrial fibrillation: Secondary | ICD-10-CM | POA: Diagnosis present

## 2019-12-31 DIAGNOSIS — I2510 Atherosclerotic heart disease of native coronary artery without angina pectoris: Secondary | ICD-10-CM | POA: Diagnosis present

## 2019-12-31 DIAGNOSIS — I509 Heart failure, unspecified: Secondary | ICD-10-CM

## 2019-12-31 DIAGNOSIS — E785 Hyperlipidemia, unspecified: Secondary | ICD-10-CM | POA: Diagnosis present

## 2019-12-31 DIAGNOSIS — J189 Pneumonia, unspecified organism: Secondary | ICD-10-CM | POA: Diagnosis not present

## 2019-12-31 DIAGNOSIS — I252 Old myocardial infarction: Secondary | ICD-10-CM

## 2019-12-31 DIAGNOSIS — J181 Lobar pneumonia, unspecified organism: Secondary | ICD-10-CM | POA: Diagnosis not present

## 2019-12-31 DIAGNOSIS — I5043 Acute on chronic combined systolic (congestive) and diastolic (congestive) heart failure: Secondary | ICD-10-CM | POA: Diagnosis not present

## 2019-12-31 DIAGNOSIS — Z8619 Personal history of other infectious and parasitic diseases: Secondary | ICD-10-CM

## 2019-12-31 DIAGNOSIS — Z794 Long term (current) use of insulin: Secondary | ICD-10-CM

## 2019-12-31 DIAGNOSIS — Z79899 Other long term (current) drug therapy: Secondary | ICD-10-CM

## 2019-12-31 DIAGNOSIS — Z823 Family history of stroke: Secondary | ICD-10-CM

## 2019-12-31 LAB — CBC WITH DIFFERENTIAL/PLATELET
Abs Immature Granulocytes: 0.03 10*3/uL (ref 0.00–0.07)
Basophils Absolute: 0.1 10*3/uL (ref 0.0–0.1)
Basophils Absolute: 0.1 10*3/uL (ref 0.0–0.1)
Basophils Relative: 0.8 % (ref 0.0–3.0)
Basophils Relative: 1 %
Eosinophils Absolute: 0.2 10*3/uL (ref 0.0–0.5)
Eosinophils Absolute: 0.2 10*3/uL (ref 0.0–0.7)
Eosinophils Relative: 2.3 % (ref 0.0–5.0)
Eosinophils Relative: 3 %
HCT: 33.2 % — ABNORMAL LOW (ref 39.0–52.0)
HCT: 38.2 % — ABNORMAL LOW (ref 39.0–52.0)
Hemoglobin: 11 g/dL — ABNORMAL LOW (ref 13.0–17.0)
Hemoglobin: 11.9 g/dL — ABNORMAL LOW (ref 13.0–17.0)
Immature Granulocytes: 0 %
Lymphocytes Relative: 12 %
Lymphocytes Relative: 9.2 % — ABNORMAL LOW (ref 12.0–46.0)
Lymphs Abs: 0.7 10*3/uL (ref 0.7–4.0)
Lymphs Abs: 0.9 10*3/uL (ref 0.7–4.0)
MCH: 30.4 pg (ref 26.0–34.0)
MCHC: 31.2 g/dL (ref 30.0–36.0)
MCHC: 33.1 g/dL (ref 30.0–36.0)
MCV: 92.7 fl (ref 78.0–100.0)
MCV: 97.7 fL (ref 80.0–100.0)
Monocytes Absolute: 0.6 10*3/uL (ref 0.1–1.0)
Monocytes Absolute: 0.7 10*3/uL (ref 0.1–1.0)
Monocytes Relative: 7.5 % (ref 3.0–12.0)
Monocytes Relative: 9 %
Neutro Abs: 5.6 10*3/uL (ref 1.7–7.7)
Neutro Abs: 6 10*3/uL (ref 1.4–7.7)
Neutrophils Relative %: 75 %
Neutrophils Relative %: 80.2 % — ABNORMAL HIGH (ref 43.0–77.0)
Platelets: 205 10*3/uL (ref 150–400)
Platelets: 213 10*3/uL (ref 150.0–400.0)
RBC: 3.58 Mil/uL — ABNORMAL LOW (ref 4.22–5.81)
RBC: 3.91 MIL/uL — ABNORMAL LOW (ref 4.22–5.81)
RDW: 15.5 % (ref 11.5–15.5)
RDW: 16.3 % — ABNORMAL HIGH (ref 11.5–15.5)
WBC: 7.4 10*3/uL (ref 4.0–10.5)
WBC: 7.5 10*3/uL (ref 4.0–10.5)
nRBC: 0 % (ref 0.0–0.2)

## 2019-12-31 LAB — BASIC METABOLIC PANEL
BUN: 23 mg/dL (ref 6–23)
CO2: 39 mEq/L — ABNORMAL HIGH (ref 19–32)
Calcium: 8.4 mg/dL (ref 8.4–10.5)
Chloride: 95 mEq/L — ABNORMAL LOW (ref 96–112)
Creatinine, Ser: 1.37 mg/dL (ref 0.40–1.50)
GFR: 49.49 mL/min — ABNORMAL LOW (ref >60.00)
Glucose, Bld: 143 mg/dL — ABNORMAL HIGH (ref 70–99)
Potassium: 4.1 mEq/L (ref 3.5–5.1)
Sodium: 136 mEq/L (ref 135–145)

## 2019-12-31 LAB — POCT I-STAT 7, (LYTES, BLD GAS, ICA,H+H)
Acid-Base Excess: 11 mmol/L — ABNORMAL HIGH (ref 0.0–2.0)
Bicarbonate: 37.1 mmol/L — ABNORMAL HIGH (ref 20.0–28.0)
Calcium, Ion: 1.19 mmol/L (ref 1.15–1.40)
HCT: 36 % — ABNORMAL LOW (ref 39.0–52.0)
Hemoglobin: 12.2 g/dL — ABNORMAL LOW (ref 13.0–17.0)
O2 Saturation: 98 %
Patient temperature: 98.6
Potassium: 4.3 mmol/L (ref 3.5–5.1)
Sodium: 137 mmol/L (ref 135–145)
TCO2: 39 mmol/L — ABNORMAL HIGH (ref 22–32)
pCO2 arterial: 53.5 mmHg — ABNORMAL HIGH (ref 32.0–48.0)
pH, Arterial: 7.449 (ref 7.350–7.450)
pO2, Arterial: 106 mmHg (ref 83.0–108.0)

## 2019-12-31 LAB — URINALYSIS, ROUTINE W REFLEX MICROSCOPIC
Bilirubin Urine: NEGATIVE
Glucose, UA: NEGATIVE mg/dL
Hgb urine dipstick: NEGATIVE
Ketones, ur: NEGATIVE mg/dL
Leukocytes,Ua: NEGATIVE
Nitrite: NEGATIVE
Protein, ur: NEGATIVE mg/dL
Specific Gravity, Urine: 1.009 (ref 1.005–1.030)
pH: 7 (ref 5.0–8.0)

## 2019-12-31 LAB — CBG MONITORING, ED: Glucose-Capillary: 115 mg/dL — ABNORMAL HIGH (ref 70–99)

## 2019-12-31 LAB — BRAIN NATRIURETIC PEPTIDE: Pro B Natriuretic peptide (BNP): 2590 pg/mL — ABNORMAL HIGH (ref 0.0–100.0)

## 2019-12-31 NOTE — Progress Notes (Signed)
EPIC Encounter for ICM Monitoring  Patient Name: [name] is a 86 y.o. male Date: 12/29/2019 Primary Care Physican: [name], MD Primary [place] Electrophysiologist: Allred BiV PPM: 99.4% 2/15/2021Weight: 186-187lbs 12/24/2019   Weight: 190 lbs    Spoke with wife.  She reports patient is not doing well.  He is currently being admitted to the hospital from physician office due to chest xray shows fluid in both lungs.  He has been getting confused and has low oxygen sats.   OptivolThoracic impedanceshows no significant change after Lasix changed to 80 mg bid and continues to suggest fluid accumulation since 11/20/2019.  Prescribed: Furosemide 80 mg takea tablettwice a day.  Potassium 20 mEq take 1 tablet daily.  Labs: 01/13/2020 Creatinine 1.37, BUN 23, Potassium 4.1, Sodium 136, GFR 49.49 12/11/2019 Creatinine 1.29, BUN 25, Potassium 4.7, Sodium 139, GFR 51-59 12/05/2019 Creatinine1.01, BUN12, Potassium4.3, Sodium136, GFR>60 12/04/2019 Creatinine0.97, BUN10, Potassium4.3, Sodium137, GFR>60  12/03/2019 Creatinine0.96, BUN16, Potassium4.2, Sodium138, GFR>60  12/01/2018 Creatinine1.08, BUN20, Potassium5.1, Sodium142, GFR>60  11/30/2018 Creatinine1.16, BUN27, Potassium3.8, Sodium138, GFR> 60 11/29/2018 Creatinine1.29, BUN35, Potassium4.2, [id number], GFR51-59  11/28/2018 Creatinine1.42, BUN37, Potassium3.9, [id number], GFR45-52  11/27/2018 Creatinine1.43, BUN37, Potassium3.6, [id number], GFR45-52 11/27/2019 Creatinine1.32, BUN34, Potassium4.4, Sodium140, [id number] 11/26/2019 Creatinine1.54, BUN36, Potassium5.4, [id number], [id number]  11/21/2019 Creatinine1.40, BUN15, Potassium4.3, [place]:[id number], [id number] Acomplete set of results can be found in Results Review.        Recommendations:   Patient in the process of being admitted to the hospital 3/8.  Follow-up plan: ICM clinic  phone appointment on3/16/2021 to recheck fluid levels.91 day device clinic remote transmission5/17/2021.Office appt 01/14/2020 with Dr.Varanasi.  Copy of ICM check sent to Dr.Allred.  3 month ICM trend: 12/30/2019    1 Year ICM trend:       Rosalene Billings, RN 12/25/2019 4:05 PM
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
## 2019-12-31 NOTE — Assessment & Plan Note (Addendum)
  Worsening dyspnea with minimal exertion Saturations that drop into the low 80's with minimal exertion Swollen ankles 3+ BLE edema Recent history of transudative  Effusion Plan CXR now CBC, BNP. BMET now
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
## 2019-12-31 NOTE — ED Triage Notes (Signed)
Pt from pulmonary office with ems for further evaluation of SOB. Pt has been on home oxygen (2L) since having a pleural effusion in Jan. Pt now on 3L  due to oxygen saturations at 90% in office. Wife reports some AMS the past few days. Chest XRAY and labs done at office today, BNP 2,590.

## 2019-12-31 NOTE — Telephone Encounter (Signed)
Remote ICM transmission received.  Attempted call to wife, [name] regarding ICM remote transmission and left message per DPR to return call.

## 2019-12-31 NOTE — Patient Instructions (Signed)
  Your CXR shows pneumonia and worseing heart failure We will admit you to [facility] for further assessment and care We will transport you via [facility] Follow up appointment once you have been discharged

## 2020-01-01 ENCOUNTER — Other Ambulatory Visit: Payer: Self-pay

## 2020-01-01 ENCOUNTER — Emergency Department (HOSPITAL_COMMUNITY): Payer: HMO

## 2020-01-01 ENCOUNTER — Inpatient Hospital Stay (HOSPITAL_COMMUNITY): Payer: HMO

## 2020-01-01 DIAGNOSIS — J441 Chronic obstructive pulmonary disease with (acute) exacerbation: Secondary | ICD-10-CM | POA: Diagnosis present

## 2020-01-01 DIAGNOSIS — G4733 Obstructive sleep apnea (adult) (pediatric): Secondary | ICD-10-CM | POA: Diagnosis present

## 2020-01-01 DIAGNOSIS — J90 Pleural effusion, not elsewhere classified: Secondary | ICD-10-CM | POA: Diagnosis not present

## 2020-01-01 DIAGNOSIS — N183 Chronic kidney disease, stage 3 unspecified: Secondary | ICD-10-CM | POA: Diagnosis not present

## 2020-01-01 DIAGNOSIS — I255 Ischemic cardiomyopathy: Secondary | ICD-10-CM | POA: Diagnosis present

## 2020-01-01 DIAGNOSIS — Z66 Do not resuscitate: Secondary | ICD-10-CM | POA: Diagnosis present

## 2020-01-01 DIAGNOSIS — G9341 Metabolic encephalopathy: Secondary | ICD-10-CM

## 2020-01-01 DIAGNOSIS — I25119 Atherosclerotic heart disease of native coronary artery with unspecified angina pectoris: Secondary | ICD-10-CM

## 2020-01-01 DIAGNOSIS — Z20822 Contact with and (suspected) exposure to covid-19: Secondary | ICD-10-CM | POA: Diagnosis present

## 2020-01-01 DIAGNOSIS — I959 Hypotension, unspecified: Secondary | ICD-10-CM | POA: Diagnosis not present

## 2020-01-01 DIAGNOSIS — I4819 Other persistent atrial fibrillation: Secondary | ICD-10-CM | POA: Diagnosis present

## 2020-01-01 DIAGNOSIS — E1165 Type 2 diabetes mellitus with hyperglycemia: Secondary | ICD-10-CM

## 2020-01-01 DIAGNOSIS — J9622 Acute and chronic respiratory failure with hypercapnia: Secondary | ICD-10-CM

## 2020-01-01 DIAGNOSIS — T380X5A Adverse effect of glucocorticoids and synthetic analogues, initial encounter: Secondary | ICD-10-CM | POA: Diagnosis present

## 2020-01-01 DIAGNOSIS — I2510 Atherosclerotic heart disease of native coronary artery without angina pectoris: Secondary | ICD-10-CM | POA: Diagnosis present

## 2020-01-01 DIAGNOSIS — E785 Hyperlipidemia, unspecified: Secondary | ICD-10-CM | POA: Diagnosis present

## 2020-01-01 DIAGNOSIS — R5381 Other malaise: Secondary | ICD-10-CM

## 2020-01-01 DIAGNOSIS — I420 Dilated cardiomyopathy: Secondary | ICD-10-CM | POA: Diagnosis present

## 2020-01-01 DIAGNOSIS — R7881 Bacteremia: Secondary | ICD-10-CM | POA: Diagnosis present

## 2020-01-01 DIAGNOSIS — K219 Gastro-esophageal reflux disease without esophagitis: Secondary | ICD-10-CM | POA: Diagnosis present

## 2020-01-01 DIAGNOSIS — E039 Hypothyroidism, unspecified: Secondary | ICD-10-CM | POA: Diagnosis present

## 2020-01-01 DIAGNOSIS — I5043 Acute on chronic combined systolic (congestive) and diastolic (congestive) heart failure: Secondary | ICD-10-CM

## 2020-01-01 DIAGNOSIS — B965 Pseudomonas (aeruginosa) (mallei) (pseudomallei) as the cause of diseases classified elsewhere: Secondary | ICD-10-CM | POA: Diagnosis present

## 2020-01-01 DIAGNOSIS — I110 Hypertensive heart disease with heart failure: Secondary | ICD-10-CM | POA: Diagnosis present

## 2020-01-01 DIAGNOSIS — J9621 Acute and chronic respiratory failure with hypoxia: Secondary | ICD-10-CM | POA: Diagnosis present

## 2020-01-01 DIAGNOSIS — I509 Heart failure, unspecified: Secondary | ICD-10-CM | POA: Diagnosis present

## 2020-01-01 DIAGNOSIS — J9811 Atelectasis: Secondary | ICD-10-CM | POA: Diagnosis present

## 2020-01-01 DIAGNOSIS — N179 Acute kidney failure, unspecified: Secondary | ICD-10-CM | POA: Diagnosis not present

## 2020-01-01 DIAGNOSIS — I5023 Acute on chronic systolic (congestive) heart failure: Secondary | ICD-10-CM | POA: Diagnosis present

## 2020-01-01 DIAGNOSIS — B9561 Methicillin susceptible Staphylococcus aureus infection as the cause of diseases classified elsewhere: Secondary | ICD-10-CM

## 2020-01-01 DIAGNOSIS — I480 Paroxysmal atrial fibrillation: Secondary | ICD-10-CM

## 2020-01-01 DIAGNOSIS — R4182 Altered mental status, unspecified: Secondary | ICD-10-CM

## 2020-01-01 DIAGNOSIS — N390 Urinary tract infection, site not specified: Secondary | ICD-10-CM | POA: Diagnosis present

## 2020-01-01 LAB — COMPREHENSIVE METABOLIC PANEL
ALT: 5 U/L (ref 0–44)
AST: 17 U/L (ref 15–41)
Albumin: 2.4 g/dL — ABNORMAL LOW (ref 3.5–5.0)
Alkaline Phosphatase: 98 U/L (ref 38–126)
Anion gap: 9 (ref 5–15)
BUN: 21 mg/dL (ref 8–23)
CO2: 34 mmol/L — ABNORMAL HIGH (ref 22–32)
Calcium: 8.7 mg/dL — ABNORMAL LOW (ref 8.9–10.3)
Chloride: 94 mmol/L — ABNORMAL LOW (ref 98–111)
Creatinine, Ser: 1.34 mg/dL — ABNORMAL HIGH (ref 0.61–1.24)
GFR calc Af Amer: 56 mL/min — ABNORMAL LOW (ref >60)
GFR calc non Af Amer: 48 mL/min — ABNORMAL LOW (ref >60)
Glucose, Bld: 101 mg/dL — ABNORMAL HIGH (ref 70–99)
Potassium: 4.4 mmol/L (ref 3.5–5.1)
Sodium: 137 mmol/L (ref 135–145)
Total Bilirubin: 0.6 mg/dL (ref 0.3–1.2)
Total Protein: 6.6 g/dL (ref 6.5–8.1)

## 2020-01-01 LAB — PROTIME-INR
INR: 1.5 — ABNORMAL HIGH (ref 0.8–1.2)
Prothrombin Time: 17.7 seconds — ABNORMAL HIGH (ref 11.4–15.2)

## 2020-01-01 LAB — RESPIRATORY PANEL BY RT PCR (FLU A&B, COVID)
Influenza A by PCR: NEGATIVE
Influenza B by PCR: NEGATIVE
SARS Coronavirus 2 by RT PCR: NEGATIVE

## 2020-01-01 LAB — GLUCOSE, CAPILLARY
Glucose-Capillary: 208 mg/dL — ABNORMAL HIGH (ref 70–99)
Glucose-Capillary: 271 mg/dL — ABNORMAL HIGH (ref 70–99)

## 2020-01-01 LAB — HEMOGLOBIN A1C
Hgb A1c MFr Bld: 7.3 % — ABNORMAL HIGH (ref 4.8–5.6)
Mean Plasma Glucose: 162.81 mg/dL

## 2020-01-01 LAB — TROPONIN I (HIGH SENSITIVITY)
Troponin I (High Sensitivity): 28 ng/L — ABNORMAL HIGH (ref <18)
Troponin I (High Sensitivity): 33 ng/L — ABNORMAL HIGH (ref <18)

## 2020-01-01 LAB — PROCALCITONIN: Procalcitonin: 0.12 ng/mL

## 2020-01-01 LAB — RPR: RPR Ser Ql: NONREACTIVE

## 2020-01-01 LAB — VITAMIN B12: Vitamin B-12: 1383 pg/mL — ABNORMAL HIGH (ref 180–914)

## 2020-01-01 LAB — CBG MONITORING, ED
Glucose-Capillary: 74 mg/dL (ref 70–99)
Glucose-Capillary: 177 mg/dL — ABNORMAL HIGH (ref 70–99)

## 2020-01-01 LAB — LACTIC ACID, PLASMA: Lactic Acid, Venous: 1.3 mmol/L (ref 0.5–1.9)

## 2020-01-01 LAB — BRAIN NATRIURETIC PEPTIDE: B Natriuretic Peptide: 2116.7 pg/mL — ABNORMAL HIGH (ref 0.0–100.0)

## 2020-01-01 LAB — TSH: TSH: 2.533 u[IU]/mL (ref 0.350–4.500)

## 2020-01-01 LAB — AMMONIA: Ammonia: 33 umol/L (ref 9–35)

## 2020-01-01 MED ORDER — ALBUTEROL SULFATE (2.5 MG/3ML) 0.083% IN NEBU
2.5000 mg | INHALATION_SOLUTION | RESPIRATORY_TRACT | Status: DC | PRN
  Administered 2020-01-03: 01:00:00 2.5 mg via RESPIRATORY_TRACT
  Filled 2020-01-01: qty 3

## 2020-01-01 MED ORDER — ATORVASTATIN CALCIUM 80 MG PO TABS
80.0000 mg | ORAL_TABLET | Freq: Every day | ORAL | Status: DC
  Administered 2020-01-01 – 2020-01-03 (×3): 80 mg via ORAL
  Filled 2020-01-01 (×3): qty 1

## 2020-01-01 MED ORDER — IPRATROPIUM-ALBUTEROL 0.5-2.5 (3) MG/3ML IN SOLN: 3.0000 mL | Freq: Four times a day (QID) | RESPIRATORY_TRACT | Status: DC | PRN

## 2020-01-01 MED ORDER — SODIUM CHLORIDE 0.9 % IV SOLN
250.0000 mL | INTRAVENOUS | Status: DC | PRN
  Administered 2020-01-01 – 2020-01-02 (×3): 250 mL via INTRAVENOUS

## 2020-01-01 MED ORDER — ARFORMOTEROL TARTRATE 15 MCG/2ML IN NEBU
15.0000 ug | INHALATION_SOLUTION | Freq: Two times a day (BID) | RESPIRATORY_TRACT | Status: DC
  Administered 2020-01-01 – 2020-01-03 (×6): 15 ug via RESPIRATORY_TRACT
  Filled 2020-01-01 (×8): qty 2

## 2020-01-01 MED ORDER — INSULIN ASPART 100 UNIT/ML ~~LOC~~ SOLN
0.0000 [IU] | Freq: Three times a day (TID) | SUBCUTANEOUS | Status: DC
  Administered 2020-01-01: 18:00:00 3 [IU] via SUBCUTANEOUS
  Administered 2020-01-02: 12:00:00 7 [IU] via SUBCUTANEOUS

## 2020-01-01 MED ORDER — METOPROLOL TARTRATE 12.5 MG HALF TABLET
12.5000 mg | ORAL_TABLET | Freq: Two times a day (BID) | ORAL | Status: DC
  Administered 2020-01-02: 09:00:00 12.5 mg via ORAL
  Filled 2020-01-01 (×2): qty 1

## 2020-01-01 MED ORDER — FERROUS SULFATE 325 (65 FE) MG PO TABS
325.0000 mg | ORAL_TABLET | Freq: Every evening | ORAL | Status: DC
  Administered 2020-01-01 – 2020-01-03 (×3): 325 mg via ORAL
  Filled 2020-01-01 (×3): qty 1

## 2020-01-01 MED ORDER — FUROSEMIDE 10 MG/ML IJ SOLN
40.0000 mg | INTRAMUSCULAR | Status: AC
  Administered 2020-01-01: 02:00:00 40 mg via INTRAVENOUS
  Filled 2020-01-01: qty 4

## 2020-01-01 MED ORDER — PREDNISONE 20 MG PO TABS
40.0000 mg | ORAL_TABLET | Freq: Every day | ORAL | Status: DC
  Administered 2020-01-01 – 2020-01-03 (×3): 40 mg via ORAL
  Filled 2020-01-01 (×3): qty 2

## 2020-01-01 MED ORDER — IPRATROPIUM-ALBUTEROL 0.5-2.5 (3) MG/3ML IN SOLN
3.0000 mL | Freq: Four times a day (QID) | RESPIRATORY_TRACT | Status: DC
  Administered 2020-01-01 (×2): 3 mL via RESPIRATORY_TRACT
  Filled 2020-01-01 (×2): qty 3

## 2020-01-01 MED ORDER — FUROSEMIDE 10 MG/ML IJ SOLN
40.0000 mg | Freq: Two times a day (BID) | INTRAMUSCULAR | Status: DC
  Administered 2020-01-01 (×2): 40 mg via INTRAVENOUS
  Filled 2020-01-01 (×2): qty 4

## 2020-01-01 MED ORDER — SODIUM CHLORIDE 0.9% FLUSH
3.0000 mL | Freq: Two times a day (BID) | INTRAVENOUS | Status: DC
  Administered 2020-01-01: 20 mL via INTRAVENOUS
  Administered 2020-01-02 – 2020-01-03 (×3): 3 mL via INTRAVENOUS

## 2020-01-01 MED ORDER — SODIUM CHLORIDE 0.9% FLUSH: 3.0000 mL | INTRAVENOUS | Status: DC | PRN

## 2020-01-01 MED ORDER — GUAIFENESIN ER 600 MG PO TB12
600.0000 mg | ORAL_TABLET | Freq: Two times a day (BID) | ORAL | Status: DC
  Administered 2020-01-01 – 2020-01-03 (×5): 600 mg via ORAL
  Filled 2020-01-01 (×5): qty 1

## 2020-01-01 MED ORDER — CEFAZOLIN SODIUM-DEXTROSE 2-4 GM/100ML-% IV SOLN
2.0000 g | Freq: Once | INTRAVENOUS | Status: AC
  Administered 2020-01-01: 02:00:00 2 g via INTRAVENOUS
  Filled 2020-01-01: qty 100

## 2020-01-01 MED ORDER — BUDESONIDE 0.25 MG/2ML IN SUSP
0.2500 mg | Freq: Two times a day (BID) | RESPIRATORY_TRACT | Status: DC
  Administered 2020-01-01 – 2020-01-03 (×6): 0.25 mg via RESPIRATORY_TRACT
  Filled 2020-01-01 (×7): qty 2

## 2020-01-01 MED ORDER — ACETAMINOPHEN 325 MG PO TABS: 650.0000 mg | ORAL_TABLET | ORAL | Status: DC | PRN

## 2020-01-01 MED ORDER — ACETAMINOPHEN 500 MG PO TABS
1000.0000 mg | ORAL_TABLET | Freq: Three times a day (TID) | ORAL | Status: DC | PRN
  Administered 2020-01-02 – 2020-01-03 (×3): 1000 mg via ORAL
  Filled 2020-01-01 (×3): qty 2

## 2020-01-01 MED ORDER — CEFAZOLIN SODIUM-DEXTROSE 2-4 GM/100ML-% IV SOLN
2.0000 g | Freq: Three times a day (TID) | INTRAVENOUS | Status: DC
  Administered 2020-01-01 – 2020-01-02 (×3): 2 g via INTRAVENOUS
  Filled 2020-01-01 (×4): qty 100

## 2020-01-01 MED ORDER — WARFARIN - PHARMACIST DOSING INPATIENT: Freq: Every day | Status: DC

## 2020-01-01 MED ORDER — ENSURE ENLIVE PO LIQD
237.0000 mL | Freq: Two times a day (BID) | ORAL | Status: DC
  Administered 2020-01-02 – 2020-01-03 (×3): 237 mL via ORAL

## 2020-01-01 MED ORDER — LEVOTHYROXINE SODIUM 100 MCG PO TABS
100.0000 ug | ORAL_TABLET | Freq: Every day | ORAL | Status: DC
  Administered 2020-01-02 – 2020-01-03 (×2): 100 ug via ORAL
  Filled 2020-01-01 (×2): qty 1

## 2020-01-01 MED ORDER — AMIODARONE HCL 200 MG PO TABS
200.0000 mg | ORAL_TABLET | Freq: Every day | ORAL | Status: DC
  Administered 2020-01-01 – 2020-01-03 (×3): 200 mg via ORAL
  Filled 2020-01-01 (×3): qty 1

## 2020-01-01 MED ORDER — VITAMIN B-12 1000 MCG PO TABS
1000.0000 ug | ORAL_TABLET | Freq: Every day | ORAL | Status: DC
  Administered 2020-01-01 – 2020-01-03 (×3): 1000 ug via ORAL
  Filled 2020-01-01 (×3): qty 1

## 2020-01-01 MED ORDER — WARFARIN SODIUM 2.5 MG PO TABS
3.5000 mg | ORAL_TABLET | Freq: Once | ORAL | Status: AC
  Administered 2020-01-01: 18:00:00 3.5 mg via ORAL
  Filled 2020-01-01: qty 1

## 2020-01-01 MED ORDER — INSULIN ASPART 100 UNIT/ML ~~LOC~~ SOLN
0.0000 [IU] | Freq: Every day | SUBCUTANEOUS | Status: DC
  Administered 2020-01-01: 22:00:00 3 [IU] via SUBCUTANEOUS

## 2020-01-01 NOTE — Progress Notes (Signed)
Pharmacy Antibiotic Note  [name] is a 86 y.o. male admitted on 12/24/2019 c/o SOB, sent from pulm office, to continue ABX for bacteremia.  Pharmacy has been consulted for Ancef dosing with plan to continue therapy until 01/07/20 (has been on outpatient therapy after starting therapy in hospital for MSSA bacteremia).  Plan: Continue Ancef 2g IV Q8H.  Height: 5\' 8"  (172.7 cm) Weight: 184 lb (83.5 kg) IBW/kg (Calculated) : 68.4  Temp (24hrs), Avg:98.2 F (36.8 C), Min:97.9 F (36.6 C), Max:98.5 F (36.9 C)  Recent Labs  Lab 12/28/19 1104 01/12/2020 1441 01/09/2020 2334  WBC  --  7.5 7.4  CREATININE 1.49* 1.37 1.34*  LATICACIDVEN  --   --  1.3    Estimated Creatinine Clearance: 43.2 mL/min (A) (by C-G formula based on SCr of 1.34 mg/dL (H)).    Allergies  Allergen Reactions  . Adhesive [Tape] Other (See Comments)    PATIENT'S SKIN TEARS AND BRUISES VERY EASILY- IS TAKING COUMADIN!!  . Aldactone [Spironolactone] Other (See Comments)    Hyperkalemia  reported by Dr. [name] - pt is currently taking 12.5 mg daily -November 2017 per medication list by same MD  . Losartan Other (See Comments)    "Landed the patient in the hospital"- [place]     Thank you for allowing pharmacy to be a part of this patient's care.  Wynona Neat, PharmD, BCPS  01/01/2020 7:19 AM

## 2020-01-01 NOTE — ED Notes (Signed)
Pt provided breakfast tray.
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
## 2020-01-01 NOTE — Consult Note (Addendum)
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 PM

## 2020-01-01 NOTE — Progress Notes (Signed)
ANTICOAGULATION CONSULT NOTE - Initial Consult  Pharmacy Consult for Warfarin Indication: atrial fibrillation  Allergies  Allergen Reactions  . Adhesive [Tape] Other (See Comments)    PATIENT'S SKIN TEARS AND BRUISES VERY EASILY- IS TAKING COUMADIN!!  . Aldactone [Spironolactone] Other (See Comments)    Hyperkalemia  reported by Dr. [name] - pt is currently taking 12.5 mg daily -November 2017 per medication list by same MD  . Losartan Other (See Comments)    "Landed the patient in the hospital"- AFFECTED KIDNEY FUNCTION    Patient Measurements: Height: 5\' 8"  (172.7 cm) Weight: 184 lb (83.5 kg) IBW/kg (Calculated) : 68.4  Vital Signs: Temp: 98 F (36.7 C) (03/09 1613) Temp Source: Oral (03/09 1613) BP: 149/66 (03/09 0930) Pulse Rate: 84 (03/09 1545)  Labs: Recent Labs    01/18/2020 1441 01/14/2020 1441 12/24/2019 2248 01/13/2020 2334  HGB 11.0*   < > 12.2* 11.9*  HCT 33.2*  --  36.0* 38.2*  PLT 213.0  --   --  205  LABPROT  --   --   --  17.7*  INR  --   --   --  1.5*  CREATININE 1.37  --   --  1.34*  TROPONINIHS  --   --   --  33*   < > = values in this interval not displayed.    Estimated Creatinine Clearance: 43.2 mL/min (A) (by C-G formula based on SCr of 1.34 mg/dL (H)).   Medical History: Past Medical History:  Diagnosis Date  . Atrial fibrillation ([place])    persistent, previously seen at [facility] and placed on amiodarone  . Benign prostatic hypertrophy   . CAD (coronary artery disease)    multivessel s/p inferolateral wall MI with subsequent CABG 11/1998.  Cath 2009 with Patent grafts  . Cleft palate   . COPD with emphysema ([place]) 04/01/2010  . DM (diabetes mellitus), type 2 ([place])   . Dyspnea   . GERD (gastroesophageal reflux disease)   . HTN (hypertension)   . Hyperlipidemia   . Hypothyroidism   . Iron deficiency anemia   . Ischemic dilated cardiomyopathy ([place])    EF 35-40% by MUGA 6/11  . MSSA bacteremia 11/27/2019  . Myocardial infarction ([place])   .  Nasal septal deviation   . Nephrolithiasis   . OSA (obstructive sleep apnea)   . PAF (paroxysmal atrial fibrillation) ([place])   . Peripheral arterial disease ([facility])    left subclavian artery stenosis  . PNA (pneumonia)   . Psoriasis   . Seborrheic keratosis   . Stroke ([place])   . Systolic congestive heart failure ([place]) 2009   s/p BiV ICD implantation by Dr Leonia Reeves (MDT)    Assessment: 86 yr old male admitted on 01/01/2020 for acute on chronic hypoxic respiratory failure due to CHF and COPD exacerbation. Medical problems include: a fib (on warfarin PTA), ICM/systolic CHF (EF 99991111), CAD/CABG, DM2, PAD, MSSA bacteremia (on Ancef via PICC), and hypothyroidism.  Pt's home warfarin regimen is warfarin 2.5 mg PO daily (per pt, his dose had recently been reduced to this regimen due to a high INR of 3.5 at last clinic visit on 12/17/19).   H/H 11.9/38.2, platelets 205; INR on admission yesterday evening ~2330 was 1.5 (pt's last dose of warfarin was on Sunday). Pt is on stable regimen of amiodarone 200 mg PO daily.  Goal of Therapy:  INR 2-3 Monitor platelets by anticoagulation protocol: Yes   Plan:  Warfarin 3.5 mg PO X 1 this evening  Monitor daily INR, CBC Monitor for signs/symptoms of bleeding   Gillermina Hu, PharmD, BCPS, [facility] Clinical Pharmacist 01/01/2020,4:21 PM

## 2020-01-01 NOTE — Progress Notes (Signed)
PROGRESS NOTE  [name] [id number] DOB: 05-16-1934   PCP: [name], MD  Patient is from: Home.  Ambulates with walker at baseline.  DOA: 01/11/2020 LOS: 0  Brief Narrative / Interim history: 86 year old with A. fib on Coumadin, ICM/systolic CHF (EF 99991111), CAD/CABG, DM-2, PAD, COPD, MSSA bacteremia on IV Ancef via PICC and hypothyroidism sent from pulmonary office to ED with concerning chest x-ray.  He presented to his pulmonologist on 3/8 with worsening hypoxia, dyspnea, lower extremity edema and altered mental status.  CXR concerning for possible pulmonary edema/pneumonia.  He was directed to ED for further evaluation and treatment.  In ED, HDS.  100% on 3 L. Cr 1.34 (about baseline).  BUN 21.  ABG 7.4 4/53/106/37.  Hgb 11.0 (above baseline).  BNP 2100 (higher than baseline).  LDL normal.  Troponin 33.  INR 1.5.  CT head negative for acute finding.  Influenza and COVID-19 PCR negative.  Urinalysis negative.  EKG with V paced rhythm but no acute ischemic finding.  CXR with bibasilar and RML opacity with associated bilateral nonspecific bronchitis and pleural effusion, pulmonary edema with cardiomegaly and possible Langerhans cell histocytosis in both apices.  Patient was admitted for acute on chronic hypoxic hypercarbic respiratory failure due to CHF and COPD exacerbation.  CT chest without contrast ordered.  Started on IV Lasix, systemic steroid and breathing treatments.  Later in the morning, pulmonology and cardiology consulted.   Subjective: Feels somewhat better this morning.  He is on 4 L by [place] saturating in upper 90s but desaturated to 70s just sitting up in bed for exam.  Denies chest pain or dyspnea lying down.  Reports some leg swelling.  Denies headache, vision change, focal weakness, numbness or tingling.  Denies GI or UTI symptoms.  Objective: Vitals:   01/01/20 0057 01/01/20 0200 01/01/20 0606 01/01/20 0930  BP: (!) 152/65 102/76 (!) 147/100 (!) 149/66    Pulse: 72 76 77 72  Resp: 20 19 18  (!) 25  Temp:      TempSrc:      SpO2: 96% 91% 94% 99%  Weight:      Height:        Intake/Output Summary (Last 24 hours) at 01/01/2020 1523 Last data filed at 01/01/2020 0907 Gross per 24 hour  Intake 193 ml  Output --  Net 193 ml   Filed Weights   01/16/2020 2218  Weight: 83.5 kg    Examination:  GENERAL: No acute distress.  Appears well.  HEENT: MMM.  Vision and hearing grossly intact.  NECK: Supple.  No apparent JVD but difficult exam.  RESP: On 4 L by .  Oxygen dropped to 70s just sitting up for exam.  Some IWOB.  Diminished aeration with rhonchi bilaterally. CVS:  RRR. Heart sounds normal.  ABD/GI/GU: Bowel sounds present. Soft. Non tender.  MSK/EXT:  Moves extremities. No apparent deformity.  1+ pitting edema bilaterally SKIN: no apparent skin lesion or wound NEURO: Awake, alert and oriented appropriately.  No apparent focal neuro deficit. PSYCH: Calm. Normal affect  Procedures:  None  Assessment & Plan: Acute on chronic respiratory failure with hypoxia and hypercarbia: Likely a combination of CHF exacerbation, pleural effusion and COPD exacerbation.  He desaturated to 70s just sitting up in bed for exam.  ABG with mild hypercarbia.  CT chest without contrast with persistent moderate-sized bilateral pleural effusion, Rt > Lt, resolution of pulmonary edema seen on prior study, stable emphysematous changes and stable borderline mediastinal lymph node.  Doubt  PE without tachycardia although INR is subtherapeutic.  Doubt infectious process without fever, leukocytosis or lactic acidosis.  Influenza PCR and COVID-19 PCR negative. -Wean oxygen as able -Treat treatable causes as below. -Check procalcitonin  Acute on chronic systolic CHF status post BiV ICD: TEE on 11/29/2019 with EF of 25 to 30%, global hypokinesis.  Some concerning findings for CHF including dyspnea, edema, pleural effusion and cardiomegaly.  BNP higher than baseline.  Weight  10 pounds up from 12/04/2019 but different scale. -Consult cardiology -Continue IV Lasix 40 mg twice daily -GDMT-May change Coreg to metoprolol-beta-1 selective in the setting of COPD -Monitor fluid status, renal function and electrolytes   COPD exacerbation: On Trelegy Ellipta and 3 L at baseline.  ABG with mild hypercarbia.  -Continue prednisone, Brovana, budesonide and DuoNeb -Wean oxygen as able -Appreciate pulmonology input  Bilateral moderate pleural effusion, Rt>Lt: Likely due to CHF -Appreciate pulmonology input-no thoracocentesis -Diuretics as above  Acute metabolic encephalopathy: Unclear etiology.  Has mild hypercarbia but not significant enough to cause mental status change.  CT head, B12, TSH and ammonia within normal.  He is now oriented appropriately -Frequent reorientation, delirium and fall precautions.  History of MSSA bacteremia: No leukocytosis.  Lactic acid within normal. -To complete Ancef on 01/07/2020 -Follow blood cultures.  Mild troponin elevation due to demand ischemia versus ACS.  History of CAD/CABG  -EKG with paced rhythm.  No acute ischemic finding.   No chest pain.  Atrial fibrillation on Coumadin with subtherapeutic INR: INR 1.5.   -Resume warfarin per pharmacy.  No thoracocentesis per pulmonology. -Continue amiodarone-not ideal in the setting of COPD.  Insulin-dependent type 2 diabetes with hyperglycemia:  Recent Labs    01/11/2020 1954 01/01/20 0728 01/01/20 1216  GLUCAP 115* 74 177*  -Continue SSI and Statin -Check hemoglobin A1c  OSA: Continue CPAP at night.  Debility/physical deconditioning -PT/OT eval            DVT prophylaxis: On warfarin for A. fib Code Status: DNR/DNI Family Communication: Patient and/or RN.  Updated patient's wife over the phone  Discharge barrier: Admitted this morning with acute respiratory failure, CHF and COPD exacerbation Patient is from: Home Final disposition: To be determined based on clinical  course  Consultants: Cardiology and pulmonology   Microbiology summarized: COVID-19 negative Influenza PCR negative Blood cultures pending Urine culture pending  Sch Meds:  Scheduled Meds: . arformoterol  15 mcg Nebulization BID  . budesonide (PULMICORT) nebulizer solution  0.25 mg Nebulization BID  . furosemide  40 mg Intravenous BID  . guaiFENesin  600 mg Oral BID  . insulin aspart  0-5 Units Subcutaneous QHS  . insulin aspart  0-9 Units Subcutaneous TID WC  . ipratropium-albuterol  3 mL Nebulization Q6H  . predniSONE  40 mg Oral Q breakfast  . sodium chloride flush  3 mL Intravenous Q12H   Continuous Infusions: . sodium chloride    .  ceFAZolin (ANCEF) IV Stopped (01/01/20 0907)   PRN Meds:.sodium chloride, acetaminophen, albuterol, sodium chloride flush  Antimicrobials: Anti-infectives (From admission, onward)   Start     Dose/Rate Route Frequency Ordered Stop   01/01/20 0800  ceFAZolin (ANCEF) IVPB 2g/100 mL premix     2 g 200 mL/hr over 30 Minutes Intravenous Every 8 hours 01/01/20 0718 01/07/20 2359   01/01/20 0100  ceFAZolin (ANCEF) IVPB 2g/100 mL premix     2 g 200 mL/hr over 30 Minutes Intravenous  Once 01/01/20 0058 01/01/20 0255       I  have personally reviewed the following labs and images: CBC: Recent Labs  Lab 12/25/2019 1441 01/07/2020 2248 12/25/2019 2334  WBC 7.5  --  7.4  NEUTROABS 6.0  --  5.6  HGB 11.0* 12.2* 11.9*  HCT 33.2* 36.0* 38.2*  MCV 92.7  --  97.7  PLT 213.0  --  205   BMP &GFR Recent Labs  Lab 12/28/19 1104 01/07/2020 1441 12/30/2019 2248 01/06/2020 2334  NA 139 136 137 137  K 5.0 4.1 4.3 4.4  CL 95* 95*  --  94*  CO2 32* 39*  --  34*  GLUCOSE 132* 143*  --  101*  BUN 22 23  --  21  CREATININE 1.49* 1.37  --  1.34*  CALCIUM 8.6 8.4  --  8.7*   Estimated Creatinine Clearance: 43.2 mL/min (A) (by C-G formula based on SCr of 1.34 mg/dL (H)). Liver & Pancreas: Recent Labs  Lab 01/08/2020 2334  AST 17  ALT 5  ALKPHOS 98    BILITOT 0.6  PROT 6.6  ALBUMIN 2.4*   No results for input(s): LIPASE, AMYLASE in the last 168 hours. Recent Labs  Lab 01/01/20 0711  AMMONIA 33   Diabetic: No results for input(s): HGBA1C in the last 72 hours. Recent Labs  Lab 01/05/2020 1954 01/01/20 0728 01/01/20 1216  GLUCAP 115* 74 177*   Cardiac Enzymes: No results for input(s): CKTOTAL, CKMB, CKMBINDEX, TROPONINI in the last 168 hours. Recent Labs    05/09/19 1108 01/06/2020 1440  PROBNP 12,014* 2,590.0*   Coagulation Profile: Recent Labs  Lab 01/12/2020 2334  INR 1.5*   Thyroid Function Tests: Recent Labs    01/01/20 0711  TSH 2.533   Lipid Profile: No results for input(s): CHOL, HDL, LDLCALC, TRIG, CHOLHDL, LDLDIRECT in the last 72 hours. Anemia Panel: Recent Labs    01/01/20 0711  VITAMINB12 1,383*   Urine analysis:    Component Value Date/Time   COLORURINE YELLOW 01/02/2020 2220   APPEARANCEUR CLEAR 01/07/2020 2220   LABSPEC 1.009 01/01/2020 2220   PHURINE 7.0 01/06/2020 2220   GLUCOSEU NEGATIVE 01/17/2020 2220   HGBUR NEGATIVE 01/18/2020 2220   BILIRUBINUR NEGATIVE 01/09/2020 2220   KETONESUR NEGATIVE 12/27/2019 2220   PROTEINUR NEGATIVE 12/24/2019 2220   UROBILINOGEN 0.2 11/28/2008 0325   NITRITE NEGATIVE 12/27/2019 2220   LEUKOCYTESUR NEGATIVE 01/21/2020 2220   Sepsis Labs: Invalid input(s): PROCALCITONIN, [place]  Microbiology: Recent Results (from the past 240 hour(s))  Blood culture (routine x 2)     Status: None (Preliminary result)   Collection Time: 01/23/2020 11:02 PM   Specimen: BLOOD  Result Value Ref Range Status   Specimen Description BLOOD SITE NOT SPECIFIED  Final   Special Requests   Final    BOTTLES DRAWN AEROBIC AND ANAEROBIC Blood Culture adequate volume   Culture   Final    NO GROWTH < 24 HOURS Performed at [facility], [address], [place], [place] [address]    Report Status PENDING  Incomplete  Respiratory Panel by RT PCR (Flu A&B, Covid) -  Nasopharyngeal Swab     Status: None   Collection Time: 01/19/2020 11:02 PM   Specimen: Nasopharyngeal Swab  Result Value Ref Range Status   SARS Coronavirus 2 by RT PCR NEGATIVE NEGATIVE Final    Comment: (NOTE) SARS-CoV-2 target nucleic acids are NOT DETECTED. The SARS-CoV-2 RNA is generally detectable in upper respiratoy specimens during the acute phase of infection. The lowest concentration of SARS-CoV-2 viral copies this assay can detect  is 131 copies/mL. A negative result does not preclude SARS-Cov-2 infection and should not be used as the sole basis for treatment or other patient management decisions. A negative result may occur with  improper specimen collection/handling, submission of specimen other than nasopharyngeal swab, presence of viral mutation(s) within the areas targeted by this assay, and inadequate number of viral copies (<131 copies/mL). A negative result must be combined with clinical observations, patient history, and epidemiological information. The expected result is Negative. Fact Sheet for Patients:  PinkCheek.be Fact Sheet for Healthcare Providers:  GravelBags.it This test is not yet ap proved or cleared by the [place] FDA and  has been authorized for detection and/or diagnosis of SARS-CoV-2 by FDA under an Emergency Use Authorization (EUA). This EUA will remain  in effect (meaning this test can be used) for the duration of the COVID-19 declaration under Section 564(b)(1) of the Act, 21 U.S.C. section 360bbb-3(b)(1), unless the authorization is terminated or revoked sooner.    Influenza A by PCR NEGATIVE NEGATIVE Final   Influenza B by PCR NEGATIVE NEGATIVE Final    Comment: (NOTE) The Xpert Xpress SARS-CoV-2/FLU/RSV assay is intended as an aid in  the diagnosis of influenza from Nasopharyngeal swab specimens and  should not be used as a sole basis for treatment. Nasal washings and  aspirates  are unacceptable for Xpert Xpress SARS-CoV-2/FLU/RSV  testing. Fact Sheet for Patients: PinkCheek.be Fact Sheet for Healthcare Providers: GravelBags.it This test is not yet approved or cleared by the [place] FDA and  has been authorized for detection and/or diagnosis of SARS-CoV-2 by  FDA under an Emergency Use Authorization (EUA). This EUA will remain  in effect (meaning this test can be used) for the duration of the  Covid-19 declaration under Section 564(b)(1) of the Act, 21  U.S.C. section 360bbb-3(b)(1), unless the authorization is  terminated or revoked. Performed at [facility], [place] [address] [place], [place] [address]     Radiology Studies: DG Chest 2 View  Result Date: 01/02/2020 CLINICAL DATA:  Dyspnea. EXAM: CHEST - 2 VIEW COMPARISON:  December 03, 2019 FINDINGS: There is stable moderate cardiomegaly. A right PICC catheter tip is last clearly imaged projecting on the distal superior vena cava. Triple lead cardiac pacemaker with left-sided battery pack, CABG markers, intact median sternotomy wires and cervicothoracic junction ACDF are redemonstrated. Bibasilar and right middle lobe consolidation and bilateral pleural effusion are moderately sized on the right and small on the left. There is pulmonary edema as well as bilateral bronchitis. Vertebra plana deformity of L1 is chronic, similar to the November 21, 2019 CT imaging. Bilateral pulmonary cystic disease and apical nodular pleural thickening are redemonstrated. There are healed right rib fracture deformities, aortic calcified atherosclerosis, skeletal degenerative changes, and diffuse bone demineralization. IMPRESSION: 1. Bibasilar and right middle lobe consolidation, with associated bilateral nonspecific bronchitis and pleural effusions, potentially representing pneumonia and/or compressive atelectasis. 2. Pulmonary edema, with cardiomegaly, coronary artery  disease, and remote thoracic postoperative changes. 3. Chronic pulmonary cystic disease and bilateral nodular apical pleural thickening. Question an underlying Langerhans cell histiocytosis. 4. Chronic L1 vertebra plana deformity, possibly posttraumatic given the healed right rib fractures. Electronically Signed   By: Revonda Humphrey   On: 01/14/2020 15:53   CT Head Wo Contrast  Result Date: 01/01/2020 CLINICAL DATA:  Encephalopathy. EXAM: CT HEAD WITHOUT CONTRAST TECHNIQUE: Contiguous axial images were obtained from the base of the skull through the vertex without intravenous contrast. COMPARISON:  November 21, 2019 FINDINGS: Brain:  No evidence of acute infarction, hemorrhage, hydrocephalus, extra-axial collection or mass lesion/mass effect. Atrophy is again noted. Vascular: No hyperdense vessel or unexpected calcification. Skull: Normal. Negative for fracture or focal lesion. Sinuses/Orbits: There is a mucosal retention cyst within the right maxillary sinus. There is mild opacification of the left mastoid air cells. Other: There is an old fracture of the dens. No acute displaced fracture identified on this study. IMPRESSION: 1. No acute intracranial abnormality. 2. Atrophy is again noted. Electronically Signed   By: Constance Holster M.D.   On: 01/01/2020 00:19   CT CHEST WO CONTRAST  Result Date: 01/01/2020 CLINICAL DATA:  Progressive shortness of breath. EXAM: CT CHEST WITHOUT CONTRAST TECHNIQUE: Multidetector CT imaging of the chest was performed following the standard protocol without IV contrast. COMPARISON:  11/21/2019 FINDINGS: Cardiovascular: The heart is enlarged but appears stable. No pericardial effusion. The pacer wires appears stable. Stable tortuosity and calcification of the thoracic aorta. Stable densely calcified origin of the left subclavian artery with probable complete occlusion. Stable advanced three-vessel coronary artery calcifications and evidence of prior coronary artery bypass  surgery. Mediastinum/Nodes: Stable scattered borderline enlarged mediastinal lymph nodes. These are likely inflammatory/reactive. The esophagus is grossly normal. Lungs/Pleura: Persistent moderate-sized bilateral pleural effusions, right larger than left with significant overlying atelectasis. Near complete right lower lobe atelectasis. Stable underlying emphysematous changes and pulmonary scarring but resolution of pulmonary edema seen on the prior study. No worrisome pulmonary lesions. Upper Abdomen: Stable advanced atherosclerotic calcifications involving the upper abdominal aorta and branch vessels. Stable cholelithiasis. Musculoskeletal: No chest wall mass, supraclavicular or axillary adenopathy. Stable healed/healing bilateral rib fractures. No acute bony findings. Stable L1 and L2 fractures. IMPRESSION: 1. Persistent moderate-sized bilateral pleural effusions, right larger than left with significant overlying atelectasis. 2. Resolution of pulmonary edema seen on the prior study. 3. Stable emphysematous changes and pulmonary scarring. 4. Stable borderline enlarged mediastinal lymph nodes, likely inflammatory/reactive. 5. Stable advanced atherosclerotic calcifications involving the thoracic and abdominal aorta and branch vessels. Aortic Atherosclerosis (ICD10-I70.0) and Emphysema (ICD10-J43.9). Aortic Atherosclerosis (ICD10-I70.0) and Emphysema (ICD10-J43.9). Electronically Signed   By: Marijo Sanes M.D.   On: 01/01/2020 08:08   45 minutes with more than 50% spent in reviewing records, counseling patient/family and coordinating care.   Bomani Oommen T. Aucilla  If 7PM-7AM, please contact night-coverage [email or web address] Password [facility] 01/01/2020, 3:23 PM
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
## 2020-01-01 NOTE — ED Notes (Signed)
Pt taken to CT.

## 2020-01-01 NOTE — ED Notes (Signed)
Pt incontinent of urine. Pt cleaned, linens changed, and new external catheter placed. Pt given warm blankets and resting comfortably.

## 2020-01-01 NOTE — ED Notes (Signed)
Lunch Tray Ordered @ 1038.  

## 2020-01-02 ENCOUNTER — Other Ambulatory Visit: Payer: Self-pay

## 2020-01-02 DIAGNOSIS — J441 Chronic obstructive pulmonary disease with (acute) exacerbation: Secondary | ICD-10-CM

## 2020-01-02 DIAGNOSIS — N3000 Acute cystitis without hematuria: Secondary | ICD-10-CM

## 2020-01-02 DIAGNOSIS — I509 Heart failure, unspecified: Secondary | ICD-10-CM

## 2020-01-02 LAB — GLUCOSE, CAPILLARY
Glucose-Capillary: 113 mg/dL — ABNORMAL HIGH (ref 70–99)
Glucose-Capillary: 301 mg/dL — ABNORMAL HIGH (ref 70–99)
Glucose-Capillary: 242 mg/dL — ABNORMAL HIGH (ref 70–99)
Glucose-Capillary: 237 mg/dL — ABNORMAL HIGH (ref 70–99)

## 2020-01-02 LAB — URINE CULTURE: Culture: >=100000 — AB

## 2020-01-02 LAB — BASIC METABOLIC PANEL
Anion gap: 11 (ref 5–15)
BUN: 27 mg/dL — ABNORMAL HIGH (ref 8–23)
CO2: 32 mmol/L (ref 22–32)
Calcium: 8.5 mg/dL — ABNORMAL LOW (ref 8.9–10.3)
Chloride: 95 mmol/L — ABNORMAL LOW (ref 98–111)
Creatinine, Ser: 1.38 mg/dL — ABNORMAL HIGH (ref 0.61–1.24)
GFR calc Af Amer: 54 mL/min — ABNORMAL LOW (ref >60)
GFR calc non Af Amer: 47 mL/min — ABNORMAL LOW (ref >60)
Glucose, Bld: 230 mg/dL — ABNORMAL HIGH (ref 70–99)
Potassium: 3.9 mmol/L (ref 3.5–5.1)
Sodium: 138 mmol/L (ref 135–145)

## 2020-01-02 LAB — CBC
HCT: 37.4 % — ABNORMAL LOW (ref 39.0–52.0)
Hemoglobin: 11.5 g/dL — ABNORMAL LOW (ref 13.0–17.0)
MCH: 30 pg (ref 26.0–34.0)
MCHC: 30.7 g/dL (ref 30.0–36.0)
MCV: 97.7 fL (ref 80.0–100.0)
Platelets: 225 10*3/uL (ref 150–400)
RBC: 3.83 MIL/uL — ABNORMAL LOW (ref 4.22–5.81)
RDW: 15.4 % (ref 11.5–15.5)
WBC: 10.2 10*3/uL (ref 4.0–10.5)
nRBC: 0 % (ref 0.0–0.2)

## 2020-01-02 LAB — MAGNESIUM: Magnesium: 1.8 mg/dL (ref 1.7–2.4)

## 2020-01-02 LAB — PROTIME-INR
INR: 1.3 — ABNORMAL HIGH (ref 0.8–1.2)
Prothrombin Time: 16.3 seconds — ABNORMAL HIGH (ref 11.4–15.2)

## 2020-01-02 MED ORDER — INSULIN ASPART 100 UNIT/ML ~~LOC~~ SOLN
0.0000 [IU] | Freq: Every day | SUBCUTANEOUS | Status: DC
  Administered 2020-01-02: 22:00:00 2 [IU] via SUBCUTANEOUS
  Administered 2020-01-03: 3 [IU] via SUBCUTANEOUS

## 2020-01-02 MED ORDER — INSULIN ASPART 100 UNIT/ML ~~LOC~~ SOLN
4.0000 [IU] | Freq: Three times a day (TID) | SUBCUTANEOUS | Status: DC
  Administered 2020-01-03: 13:00:00 4 [IU] via SUBCUTANEOUS

## 2020-01-02 MED ORDER — IPRATROPIUM-ALBUTEROL 0.5-2.5 (3) MG/3ML IN SOLN
3.0000 mL | Freq: Three times a day (TID) | RESPIRATORY_TRACT | Status: DC
  Administered 2020-01-02 – 2020-01-03 (×3): 3 mL via RESPIRATORY_TRACT
  Filled 2020-01-02 (×4): qty 3

## 2020-01-02 MED ORDER — INSULIN ASPART 100 UNIT/ML ~~LOC~~ SOLN
0.0000 [IU] | Freq: Three times a day (TID) | SUBCUTANEOUS | Status: DC
  Administered 2020-01-02 – 2020-01-03 (×2): 5 [IU] via SUBCUTANEOUS
  Administered 2020-01-03: 8 [IU] via SUBCUTANEOUS
  Administered 2020-01-03: 08:00:00 3 [IU] via SUBCUTANEOUS

## 2020-01-02 MED ORDER — WARFARIN SODIUM 5 MG PO TABS
5.0000 mg | ORAL_TABLET | Freq: Once | ORAL | Status: AC
  Administered 2020-01-02: 18:00:00 5 mg via ORAL
  Filled 2020-01-02: qty 1

## 2020-01-02 MED ORDER — SODIUM CHLORIDE 0.9 % IV SOLN
2.0000 g | Freq: Two times a day (BID) | INTRAVENOUS | Status: DC
  Administered 2020-01-02 – 2020-01-03 (×4): 2 g via INTRAVENOUS
  Filled 2020-01-02 (×4): qty 2

## 2020-01-02 MED ORDER — CHLORHEXIDINE GLUCONATE CLOTH 2 % EX PADS
6.0000 | MEDICATED_PAD | Freq: Every day | CUTANEOUS | Status: DC
  Administered 2020-01-03: 05:00:00 6 via TOPICAL

## 2020-01-02 MED ORDER — FUROSEMIDE 10 MG/ML IJ SOLN
20.0000 mg | Freq: Two times a day (BID) | INTRAMUSCULAR | Status: DC
  Administered 2020-01-02 (×2): 20 mg via INTRAVENOUS
  Filled 2020-01-02 (×3): qty 2

## 2020-01-02 MED ORDER — LOSARTAN POTASSIUM 25 MG PO TABS: 12.5000 mg | ORAL_TABLET | Freq: Every day | ORAL | Status: DC

## 2020-01-02 MED ORDER — IPRATROPIUM-ALBUTEROL 0.5-2.5 (3) MG/3ML IN SOLN
3.0000 mL | Freq: Four times a day (QID) | RESPIRATORY_TRACT | Status: DC
  Administered 2020-01-02: 08:00:00 3 mL via RESPIRATORY_TRACT
  Filled 2020-01-02: qty 3

## 2020-01-02 NOTE — Patient Outreach (Signed)
  [facility]) Care Management Chronic Special Needs Program   01/02/2020  Name: [name], DOB: 21-Aug-1934  MRN: [id number]  The client was discussed in today's interdisciplinary care team meeting.  The following issues were discussed:  Client's needs, Changes in health status, Key risk triggers/risk stratification, Care Plan, Coordination of care, Care transitions and Issues/barriers to care  Participants present:   Bary Castilla, RN, BSN, MS, CCM   Quinn Plowman, BSN, CCM    [employer], BSN, CCM, CDE   Thea Silversmith, BSN, MSN, CCM   Jacqlyn Larsen, BSN, CCM    [facility], CBCC/ CMAA    Dr. Maryella Shivers    Roney Mans, [place], LDN ([facility])   Karrie Meres, Pharm D ([facility])   Babs Bertin, RN ([facility])    Teresa Pelton, RN ([facility])    Linus Orn, Government social research officer, CSNP ([facility])    Recommendations: none  Plan: Care coordinate with [facility] as indicated. RNCM will continue to follow. RNCM will reach out to client post discharge.   Thea Silversmith, RN, MSN, [place] [phone]

## 2020-01-02 NOTE — Progress Notes (Addendum)
Progress Note  Patient Name: [name] Date of Encounter: 01/02/2020  Primary Cardiologist: Larae Grooms, MD   Subjective   Says his breathing is better this morning. Sitting up eating breakfast. Wife at the bedside.   Inpatient Medications    Scheduled Meds: . amiodarone  200 mg Oral Daily  . arformoterol  15 mcg Nebulization BID  . atorvastatin  80 mg Oral Daily  . budesonide (PULMICORT) nebulizer solution  0.25 mg Nebulization BID  . Chlorhexidine Gluconate Cloth  6 each Topical Daily  . feeding supplement (ENSURE ENLIVE)  237 mL Oral BID BM  . ferrous sulfate  325 mg Oral QPM  . furosemide  20 mg Intravenous BID  . guaiFENesin  600 mg Oral BID  . insulin aspart  0-5 Units Subcutaneous QHS  . insulin aspart  0-9 Units Subcutaneous TID WC  . ipratropium-albuterol  3 mL Nebulization TID  . levothyroxine  100 mcg Oral QAC breakfast  . metoprolol tartrate  12.5 mg Oral BID  . predniSONE  40 mg Oral Q breakfast  . sodium chloride flush  3 mL Intravenous Q12H  . vitamin B-12  1,000 mcg Oral Daily  . Warfarin - Pharmacist Dosing Inpatient   Does not apply q1800   Continuous Infusions: . sodium chloride 250 mL (01/02/20 1017)  . ceFEPime (MAXIPIME) IV     PRN Meds: sodium chloride, acetaminophen, albuterol, sodium chloride flush   Vital Signs    Vitals:   01/01/20 2000 01/01/20 2215 01/02/20 0451 01/02/20 0754  BP:  (!) 93/54 (!) 92/38   Pulse:   72   Resp:   15   Temp:   (!) 97.5 F (36.4 C)   TempSrc:   Oral   SpO2: 97%  96% 97%  Weight:   83.2 kg   Height:        Intake/Output Summary (Last 24 hours) at 01/02/2020 1037 Last data filed at 01/02/2020 0455 Gross per 24 hour  Intake 20 ml  Output 500 ml  Net -480 ml   Last 3 Weights 01/02/2020 12/25/2019 12/06/2019  Weight (lbs) 183 lb 6.8 oz 184 lb 191 lb 9.3 oz  Weight (kg) 83.2 kg 83.462 kg 86.9 kg  Some encounter information is confidential and restricted. Go to Review Flowsheets activity to  see all data.      Telemetry    Vpacing - Personally Reviewed  ECG    No new tracing this morning.   Physical Exam  Pleasant but frail older WM GEN: No acute distress.   Neck: + JVD Cardiac: RRR, soft systolic murmur, jno rubs, or gallops.  Respiratory: Clear to auscultation bilaterally. GI: Soft, nontender, non-distended  MS: No edema; No deformity. Neuro:  Nonfocal  Psych: Normal affect   Labs    High Sensitivity Troponin:   Recent Labs  Lab 01/14/2020 2334 01/01/20 1633  TROPONINIHS 33* 28*      Chemistry Recent Labs  Lab 12/28/19 1104 12/28/19 1104 12/30/2019 1441 01/07/2020 1441 12/24/2019 2248 01/10/2020 2334 01/02/20 0853  NA 139  --  136   < > 137 137 138  K 5.0   < > 4.1   < > 4.3 4.4 3.9  CL 95*   < > 95*  --   --  94* 95*  CO2 32*   < > 39*  --   --  34* 32  GLUCOSE 132*  --  143*  --   --  101* 230*  BUN 22  --  23  --   --  21 27*  CREATININE 1.49*   < > 1.37  --   --  1.34* 1.38*  CALCIUM 8.6   < > 8.4  --   --  8.7* 8.5*  PROT  --   --   --   --   --  6.6  --   ALBUMIN  --   --   --   --   --  2.4*  --   AST  --   --   --   --   --  17  --   ALT  --   --   --   --   --  5  --   ALKPHOS  --   --   --   --   --  98  --   BILITOT  --   --   --   --   --  0.6  --   GFRNONAA 42*  --   --   --   --  48* 47*  GFRAA 49*  --   --   --   --  56* 54*  ANIONGAP  --   --   --   --   --  9 11   < > = values in this interval not displayed.     Hematology Recent Labs  Lab 12/30/2019 1441 12/26/2019 1441 01/15/2020 2248 12/28/2019 2334 01/02/20 0853  WBC 7.5  --   --  7.4 10.2  RBC 3.58*  --   --  3.91* 3.83*  HGB 11.0*   < > 12.2* 11.9* 11.5*  HCT 33.2*   < > 36.0* 38.2* 37.4*  MCV 92.7  --   --  97.7 97.7  MCH  --   --   --  30.4 30.0  MCHC 33.1  --   --  31.2 30.7  RDW 16.3*  --   --  15.5 15.4  PLT 213.0  --   --  205 225   < > = values in this interval not displayed.    BNP Recent Labs  Lab 01/21/2020 1440 01/21/2020 2334  BNP  --  2,116.7*  PROBNP  2,590.0*  --      DDimer No results for input(s): DDIMER in the last 168 hours.   Radiology    DG Chest 2 View  Result Date: 01/11/2020 CLINICAL DATA:  Dyspnea. EXAM: CHEST - 2 VIEW COMPARISON:  December 03, 2019 FINDINGS: There is stable moderate cardiomegaly. A right PICC catheter tip is last clearly imaged projecting on the distal superior vena cava. Triple lead cardiac pacemaker with left-sided battery pack, CABG markers, intact median sternotomy wires and cervicothoracic junction ACDF are redemonstrated. Bibasilar and right middle lobe consolidation and bilateral pleural effusion are moderately sized on the right and small on the left. There is pulmonary edema as well as bilateral bronchitis. Vertebra plana deformity of L1 is chronic, similar to the November 21, 2019 CT imaging. Bilateral pulmonary cystic disease and apical nodular pleural thickening are redemonstrated. There are healed right rib fracture deformities, aortic calcified atherosclerosis, skeletal degenerative changes, and diffuse bone demineralization. IMPRESSION: 1. Bibasilar and right middle lobe consolidation, with associated bilateral nonspecific bronchitis and pleural effusions, potentially representing pneumonia and/or compressive atelectasis. 2. Pulmonary edema, with cardiomegaly, coronary artery disease, and remote thoracic postoperative changes. 3. Chronic pulmonary cystic disease and bilateral nodular apical pleural thickening. Question an underlying Langerhans cell histiocytosis. 4. Chronic L1 vertebra plana  deformity, possibly posttraumatic given the healed right rib fractures. Electronically Signed   By: Revonda Humphrey   On: 12/24/2019 15:53   CT Head Wo Contrast  Result Date: 01/01/2020 CLINICAL DATA:  Encephalopathy. EXAM: CT HEAD WITHOUT CONTRAST TECHNIQUE: Contiguous axial images were obtained from the base of the skull through the vertex without intravenous contrast. COMPARISON:  November 21, 2019 FINDINGS: Brain: No  evidence of acute infarction, hemorrhage, hydrocephalus, extra-axial collection or mass lesion/mass effect. Atrophy is again noted. Vascular: No hyperdense vessel or unexpected calcification. Skull: Normal. Negative for fracture or focal lesion. Sinuses/Orbits: There is a mucosal retention cyst within the right maxillary sinus. There is mild opacification of the left mastoid air cells. Other: There is an old fracture of the dens. No acute displaced fracture identified on this study. IMPRESSION: 1. No acute intracranial abnormality. 2. Atrophy is again noted. Electronically Signed   By: Constance Holster M.D.   On: 01/01/2020 00:19   CT CHEST WO CONTRAST  Result Date: 01/01/2020 CLINICAL DATA:  Progressive shortness of breath. EXAM: CT CHEST WITHOUT CONTRAST TECHNIQUE: Multidetector CT imaging of the chest was performed following the standard protocol without IV contrast. COMPARISON:  11/21/2019 FINDINGS: Cardiovascular: The heart is enlarged but appears stable. No pericardial effusion. The pacer wires appears stable. Stable tortuosity and calcification of the thoracic aorta. Stable densely calcified origin of the left subclavian artery with probable complete occlusion. Stable advanced three-vessel coronary artery calcifications and evidence of prior coronary artery bypass surgery. Mediastinum/Nodes: Stable scattered borderline enlarged mediastinal lymph nodes. These are likely inflammatory/reactive. The esophagus is grossly normal. Lungs/Pleura: Persistent moderate-sized bilateral pleural effusions, right larger than left with significant overlying atelectasis. Near complete right lower lobe atelectasis. Stable underlying emphysematous changes and pulmonary scarring but resolution of pulmonary edema seen on the prior study. No worrisome pulmonary lesions. Upper Abdomen: Stable advanced atherosclerotic calcifications involving the upper abdominal aorta and branch vessels. Stable cholelithiasis. Musculoskeletal:  No chest wall mass, supraclavicular or axillary adenopathy. Stable healed/healing bilateral rib fractures. No acute bony findings. Stable L1 and L2 fractures. IMPRESSION: 1. Persistent moderate-sized bilateral pleural effusions, right larger than left with significant overlying atelectasis. 2. Resolution of pulmonary edema seen on the prior study. 3. Stable emphysematous changes and pulmonary scarring. 4. Stable borderline enlarged mediastinal lymph nodes, likely inflammatory/reactive. 5. Stable advanced atherosclerotic calcifications involving the thoracic and abdominal aorta and branch vessels. Aortic Atherosclerosis (ICD10-I70.0) and Emphysema (ICD10-J43.9). Aortic Atherosclerosis (ICD10-I70.0) and Emphysema (ICD10-J43.9). Electronically Signed   By: Marijo Sanes M.D.   On: 01/01/2020 08:08    Cardiac Studies   N/a   Patient Profile     86 y.o. male with a hx of hypertension, hyperlipidemia, CAD status post CABG, hypothyroidism, former tobacco abuse, BPH, OSA, CVA, COPD, ICM with EF <20%, PAD with left subclavian stenosis, persistent A. fib on Coumadin, status post CRT-P who was seen for the evaluation of CHF at the request of Dr. Cyndia Skeeters.  Assessment & Plan    1. Acute on chronic heart failure/Acute on chronic Respiratory Failure: thought to be multifactorial in the setting of chronic heart failure, recurrent pleural effusions, and mild COPD exacerbation. Elevated BNP and CXR with bilateral pleural effusions. -- attempting to diuresis but little UOP is noted. His lasix was actually reduced back to 20mg  IV BID this morning 2/2 to soft blood pressures. Have asked the RN for a manual blood pressure over concern of inaccurate readings? Would really like to increase lasix dosing if blood pressures tolerate. If blood pressures  remain low, may need to consider inotropic support.  -- remains on [place] @3L  with intermittent episodes of hypoxia noted.  -- will hold metoprolol for now given low BP and allow for  diuresis.   2. COPD exacerbation: Patient given prednisone daily and duo nebs plus Mucinex for cough - pulmonology consulted and agree with treatment for CHF and COPD  3. AMS/Delerium: On my exam patient is A&O x 2 and very fidgety. CT of the head negative. Wife at the bedside and reports he is at his baseline this morning.  4. History of MSSA bacteremia -Blood cultures drawn and pending and patient continued on Ancef per primary.  5. Persistent Afib: vpacing on telemetry. INR remains subtherapeutic. PharmD dosing.   For questions or updates, please contact [place] Please consult [email or web address] for contact info under   Signed, Reino Bellis, NP  01/02/2020, 10:37 AM     Patient seen and examined. Agree with assessment and plan. I/O -287 since admission.  He was on Lasix 80 mg twice a day, but dose has been reduced  down to 20 mg twice a day.  Blood pressure presently 126/82.  V paced rhythm.  No leg edema presently.  Decreased breath sounds at bases consistent with pleural effusions.  No rales today.  With EF less than 20%, will try rechallenging  very low-dose ARB therapy with losartan 12.5 mg daily.  CO2 improved today to 32 down from 39 on 3/8.  Follow-up renal function in a.m.   Troy Sine, MD, [facility] 01/02/2020 11:39 AM

## 2020-01-02 NOTE — Progress Notes (Signed)
PROGRESS NOTE  [name] [id number] DOB: 16-Dec-1933   PCP: [name], MD  Patient is from: Home.  Ambulates with walker at baseline.  DOA: 12/27/2019 LOS: 1  Brief Narrative / Interim history: 86 year old with A. fib on Coumadin, ICM/systolic CHF (EF [id number]), CAD/CABG, DM-2, PAD, COPD, MSSA bacteremia on IV Ancef via PICC and hypothyroidism sent from pulmonary office to ED with concerning chest x-ray.  He presented to his pulmonologist on 3/8 with worsening hypoxia, dyspnea, lower extremity edema and altered mental status.  CXR concerning for possible pulmonary edema/pneumonia.  He was directed to ED for further evaluation and treatment.  In ED, HDS.  100% on 3 L. Cr 1.34 (about baseline).  BUN 21.  ABG 7.4 4/53/106/37.  Hgb 11.0 (above baseline).  BNP 2100 (higher than baseline).  LDL normal.  Troponin 33.  INR 1.5.  CT head negative for acute finding.  Influenza and COVID-19 PCR negative.  Urinalysis negative.  EKG with V paced rhythm but no acute ischemic finding.  CXR with bibasilar and RML opacity with associated bilateral nonspecific bronchitis and pleural effusion, pulmonary edema with cardiomegaly and possible Langerhans cell histocytosis in both apices.  Patient was admitted for acute on chronic hypoxic hypercarbic respiratory failure due to CHF and COPD exacerbation.  CT chest without contrast ordered.  Started on IV Lasix, systemic steroid and breathing treatments.  Pulmonology and cardiology consulted.   Blood cultures negative.  Urine culture with Pseudomonas aeruginosa although patient has no UTI symptoms but confusion.  Antibiotics changed to IV cefepime   Subjective: No major events overnight or this morning.  He likes to go home.  He denies chest pain or shortness of breath.  However, he has been intermittently desaturating on 3 L.  He is oriented to self, place, month and year but somewhat confused and fidgeting with the pulse ox wire.  Objective: Vitals:     01/01/20 2000 01/01/20 2215 01/02/20 0451 01/02/20 0754  BP:  (!) 93/54 (!) 92/38   Pulse:   72   Resp:   15   Temp:   (!) 97.5 F (36.4 C)   TempSrc:   Oral   SpO2: 97%  96% 97%  Weight:   83.2 kg   Height:        Intake/Output Summary (Last 24 hours) at 01/02/2020 1310 Last data filed at 01/02/2020 0455 Gross per 24 hour  Intake 20 ml  Output 500 ml  Net -480 ml   Filed Weights   01/07/2020 2218 01/02/20 0451  Weight: 83.5 kg 83.2 kg    Examination:  GENERAL: No apparent distress.  Nontoxic. HEENT: MMM.  Vision and hearing grossly intact.  NECK: Supple.  No apparent JVD.  RESP: On 3 L.  No IWOB.  Fair aeration with rhonchi bilaterally CVS:  RRR. Heart sounds normal.  ABD/GI/GU: Bowel sounds present. Soft. Non tender.  MSK/EXT:  Moves extremities. No apparent deformity. No edema.  SKIN: no apparent skin lesion or wound NEURO: Awake, alert and oriented x4 except date but confused.  Fidgeting with pulse ox well.  No apparent focal neuro deficit. PSYCH: Calm.  No apparent distress or agitation.  Procedures:  None  Assessment & Plan: Acute on chronic respiratory failure with hypoxia and hypercarbia: Likely a combination of CHF exacerbation, pleural effusion and COPD exacerbation.  ABG with mild hypercarbia.  CT chest without contrast with persistent moderate-sized bilateral pleural effusion, Rt > Lt, resolution of pulmonary edema seen on prior study, stable emphysematous changes and  stable borderline mediastinal lymph node.  Doubt PE without tachycardia although INR is subtherapeutic.  Doubt pneumonia process without fever, leukocytosis or lactic acidosis.  Procalcitonin basically negative.  Influenza PCR and COVID-19 PCR negative. -Wean oxygen as able -Treat treatable causes as below.  Acute on chronic systolic CHF status post BiV ICD: TEE on 11/29/2019 with EF of 25 to 30%, global hypokinesis.  Has had dyspnea, edema, pleural effusions and cardiomegaly.  BNP higher than  baseline.  Weight 10 pounds up from 12/04/2019 but different scale.  Iron O incomplete.  Only 500 cc charted overnight.  Creatinine is stable.  Bicarb 39> 32.  Soft blood pressures early this morning. -Appreciate cardiology guidance-reduce Lasix to 20 mg twice daily -GDMT-low-dose losartan.  Metoprolol on hold due to low BP -Monitor fluid status, renal function and electrolytes  COPD exacerbation: On Trelegy Ellipta and 3 L at baseline.  ABG with mild hypercarbia.  -Continue prednisone, Brovana, budesonide and DuoNeb -Wean oxygen as able -Appreciate pulmonology input  Bilateral moderate pleural effusion, Rt>Lt: Likely due to CHF -Appreciate pulmonology input-no thoracocentesis.  Diuretics for now  Acute metabolic encephalopathy: Unclear etiology.  has mild hypercarbia but not significant enough to cause mental status change.  CT head, B12, TSH, RPR and ammonia within normal.  He is oriented fairly but intermittently confused.  Urine culture grew Pseudomonas aeruginosa although he denies UTI symptoms -Frequent reorientation, delirium and fall precautions. -Treat for UTI  Pseudomonas aeruginosa UTI-patient has no fever, leukocytosis or UTI symptoms but not a reliable historian. History of MSSA bacteremia: to complete Ancef on 01/07/2020.  Blood cultures negative so far -Change Ancef today cefepime to cover for Pseudomonas aeruginosa UTI -Follow urine culture speciation  Mild troponin elevation due to demand ischemia versus ACS.  History of CAD/CABG  -EKG with paced rhythm.  No acute ischemic finding.   No chest pain.  Atrial fibrillation on Coumadin with subtherapeutic INR: INR 1.5> 1.3.   -Warfarin per pharmacy -Continue amiodarone-not ideal in the setting of COPD.  Insulin-dependent type 2 diabetes with hyperglycemia: Hemoglobin A1c 7.3%.  Hyperglycemia likely due to steroid Recent Labs    01/01/20 2053 01/02/20 0748 01/02/20 1129  GLUCAP 271* 113* 301*  -Increase SSI to  moderate -Added mealtime coverage had 4 units -Continue statin  OSA: Continue CPAP at night.  Debility/physical deconditioning -PT/OT eval            DVT prophylaxis: On warfarin for A. fib Code Status: DNR/DNI Family Communication: Patient and/or RN.  Updated patient's wife over the phone  Discharge barrier: Acute on chronic respiratory failure, CHF, COPD exacerbation, encephalopathy and UTI.  Patient is on IV Lasix per cardiology.  On IV cefepime pending culture sensitivity for Pseudomonas UTI. Patient is from: Home Final disposition: To be determined based on clinical course  Consultants: Cardiology and pulmonology   Microbiology summarized: COVID-19 negative Influenza PCR negative Blood cultures pending Urine culture pending  Sch Meds:  Scheduled Meds: . amiodarone  200 mg Oral Daily  . arformoterol  15 mcg Nebulization BID  . atorvastatin  80 mg Oral Daily  . budesonide (PULMICORT) nebulizer solution  0.25 mg Nebulization BID  . Chlorhexidine Gluconate Cloth  6 each Topical Daily  . feeding supplement (ENSURE ENLIVE)  237 mL Oral BID BM  . ferrous sulfate  325 mg Oral QPM  . furosemide  20 mg Intravenous BID  . guaiFENesin  600 mg Oral BID  . insulin aspart  0-5 Units Subcutaneous QHS  . insulin aspart  0-9 Units Subcutaneous TID WC  . ipratropium-albuterol  3 mL Nebulization TID  . levothyroxine  100 mcg Oral QAC breakfast  . losartan  12.5 mg Oral Daily  . predniSONE  40 mg Oral Q breakfast  . sodium chloride flush  3 mL Intravenous Q12H  . vitamin B-12  1,000 mcg Oral Daily  . warfarin  5 mg Oral ONCE-1800  . Warfarin - Pharmacist Dosing Inpatient   Does not apply q1800   Continuous Infusions: . sodium chloride 250 mL (01/02/20 1017)  . ceFEPime (MAXIPIME) IV 2 g (01/02/20 1029)   PRN Meds:.sodium chloride, acetaminophen, albuterol, sodium chloride flush  Antimicrobials: Anti-infectives (From admission, onward)   Start     Dose/Rate Route  Frequency Ordered Stop   01/02/20 0930  ceFEPIme (MAXIPIME) 2 g in sodium chloride 0.9 % 100 mL IVPB     2 g 200 mL/hr over 30 Minutes Intravenous Every 12 hours 01/02/20 0827     01/01/20 0800  ceFAZolin (ANCEF) IVPB 2g/100 mL premix  Status:  Discontinued     2 g 200 mL/hr over 30 Minutes Intravenous Every 8 hours 01/01/20 0718 01/02/20 0756   01/01/20 0100  ceFAZolin (ANCEF) IVPB 2g/100 mL premix     2 g 200 mL/hr over 30 Minutes Intravenous  Once 01/01/20 0058 01/01/20 0255       I have personally reviewed the following labs and images: CBC: Recent Labs  Lab 01/14/2020 1441 01/02/2020 2248 01/16/2020 2334 01/02/20 0853  WBC 7.5  --  7.4 10.2  NEUTROABS 6.0  --  5.6  --   HGB 11.0* 12.2* 11.9* 11.5*  HCT 33.2* 36.0* 38.2* 37.4*  MCV 92.7  --  97.7 97.7  PLT 213.0  --  205 225   BMP &GFR Recent Labs  Lab 12/28/19 1104 01/01/2020 1441 01/22/2020 2248 01/17/2020 2334 01/02/20 0853  NA 139 136 137 137 138  K 5.0 4.1 4.3 4.4 3.9  CL 95* 95*  --  94* 95*  CO2 32* 39*  --  34* 32  GLUCOSE 132* 143*  --  101* 230*  BUN 22 23  --  21 27*  CREATININE 1.49* 1.37  --  1.34* 1.38*  CALCIUM 8.6 8.4  --  8.7* 8.5*  MG  --   --   --   --  1.8   Estimated Creatinine Clearance: 41.9 mL/min (A) (by C-G formula based on SCr of 1.38 mg/dL (H)). Liver & Pancreas: Recent Labs  Lab 01/08/2020 2334  AST 17  ALT 5  ALKPHOS 98  BILITOT 0.6  PROT 6.6  ALBUMIN 2.4*   No results for input(s): LIPASE, AMYLASE in the last 168 hours. Recent Labs  Lab 01/01/20 0711  AMMONIA 33   Diabetic: Recent Labs    01/01/20 1633  HGBA1C 7.3*   Recent Labs  Lab 01/01/20 1216 01/01/20 1737 01/01/20 2053 01/02/20 0748 01/02/20 1129  GLUCAP 177* 208* 271* 113* 301*   Cardiac Enzymes: No results for input(s): CKTOTAL, CKMB, CKMBINDEX, TROPONINI in the last 168 hours. Recent Labs    05/09/19 1108 01/12/2020 1440  PROBNP 12,014* 2,590.0*   Coagulation Profile: Recent Labs  Lab 01/09/2020 2334  01/02/20 0853  INR 1.5* 1.3*   Thyroid Function Tests: Recent Labs    01/01/20 0711  TSH 2.533   Lipid Profile: No results for input(s): CHOL, HDL, LDLCALC, TRIG, CHOLHDL, LDLDIRECT in the last 72 hours. Anemia Panel: Recent Labs    01/01/20 0711  VITAMINB12 1,383*  Urine analysis:    Component Value Date/Time   COLORURINE YELLOW 12/24/2019 2220   APPEARANCEUR CLEAR 01/17/2020 2220   LABSPEC 1.009 12/30/2019 2220   PHURINE 7.0 01/11/2020 2220   GLUCOSEU NEGATIVE 01/02/2020 2220   HGBUR NEGATIVE 01/10/2020 2220   BILIRUBINUR NEGATIVE 12/29/2019 2220   KETONESUR NEGATIVE 01/14/2020 2220   PROTEINUR NEGATIVE 01/01/2020 2220   UROBILINOGEN 0.2 11/28/2008 0325   NITRITE NEGATIVE 01/02/2020 2220   LEUKOCYTESUR NEGATIVE 01/16/2020 2220   Sepsis Labs: Invalid input(s): PROCALCITONIN, [place]  Microbiology: Recent Results (from the past 240 hour(s))  Blood culture (routine x 2)     Status: None (Preliminary result)   Collection Time: 12/26/2019 11:02 PM   Specimen: BLOOD  Result Value Ref Range Status   Specimen Description BLOOD SITE NOT SPECIFIED  Final   Special Requests   Final    BOTTLES DRAWN AEROBIC AND ANAEROBIC Blood Culture adequate volume   Culture   Final    NO GROWTH 2 DAYS Performed at [facility], [address], [place], [place] [address]    Report Status PENDING  Incomplete  Respiratory Panel by RT PCR (Flu A&B, Covid) - Nasopharyngeal Swab     Status: None   Collection Time: 01/15/2020 11:02 PM   Specimen: Nasopharyngeal Swab  Result Value Ref Range Status   SARS Coronavirus 2 by RT PCR NEGATIVE NEGATIVE Final    Comment: (NOTE) SARS-CoV-2 target nucleic acids are NOT DETECTED. The SARS-CoV-2 RNA is generally detectable in upper respiratoy specimens during the acute phase of infection. The lowest concentration of SARS-CoV-2 viral copies this assay can detect is 131 copies/mL. A negative result does not preclude SARS-Cov-2 infection and  should not be used as the sole basis for treatment or other patient management decisions. A negative result may occur with  improper specimen collection/handling, submission of specimen other than nasopharyngeal swab, presence of viral mutation(s) within the areas targeted by this assay, and inadequate number of viral copies (<131 copies/mL). A negative result must be combined with clinical observations, patient history, and epidemiological information. The expected result is Negative. Fact Sheet for Patients:  PinkCheek.be Fact Sheet for Healthcare Providers:  GravelBags.it This test is not yet ap proved or cleared by the [place] FDA and  has been authorized for detection and/or diagnosis of SARS-CoV-2 by FDA under an Emergency Use Authorization (EUA). This EUA will remain  in effect (meaning this test can be used) for the duration of the COVID-19 declaration under Section 564(b)(1) of the Act, 21 U.S.C. section 360bbb-3(b)(1), unless the authorization is terminated or revoked sooner.    Influenza A by PCR NEGATIVE NEGATIVE Final   Influenza B by PCR NEGATIVE NEGATIVE Final    Comment: (NOTE) The Xpert Xpress SARS-CoV-2/FLU/RSV assay is intended as an aid in  the diagnosis of influenza from Nasopharyngeal swab specimens and  should not be used as a sole basis for treatment. Nasal washings and  aspirates are unacceptable for Xpert Xpress SARS-CoV-2/FLU/RSV  testing. Fact Sheet for Patients: PinkCheek.be Fact Sheet for Healthcare Providers: GravelBags.it This test is not yet approved or cleared by the [place] FDA and  has been authorized for detection and/or diagnosis of SARS-CoV-2 by  FDA under an Emergency Use Authorization (EUA). This EUA will remain  in effect (meaning this test can be used) for the duration of the  Covid-19 declaration under Section  564(b)(1) of the Act, 21  U.S.C. section 360bbb-3(b)(1), unless the authorization is  terminated or revoked. Performed at  [facility], [place] [address] [place], [place] [address]   Urine culture     Status: Abnormal (Preliminary result)   Collection Time: 01/13/2020 11:20 PM   Specimen: Urine, Random  Result Value Ref Range Status   Specimen Description URINE, RANDOM  Final   Special Requests NONE  Final   Culture (A)  Final    >=100,000 COLONIES/mL PSEUDOMONAS AERUGINOSA SUSCEPTIBILITIES TO FOLLOW Performed at [facility], [place] [address], [place], [place] [address]    Report Status PENDING  Incomplete  Blood culture (routine x 2)     Status: None (Preliminary result)   Collection Time: 01/01/20  7:11 AM   Specimen: BLOOD RIGHT ARM  Result Value Ref Range Status   Specimen Description BLOOD RIGHT ARM  Final   Special Requests   Final    BOTTLES DRAWN AEROBIC ONLY Blood Culture adequate volume   Culture   Final    NO GROWTH 1 DAY Performed at [facility], [place] [address] [place], [place] [address]    Report Status PENDING  Incomplete    Radiology Studies: No results found.   Carollynn Pennywell T. Muncy  If 7PM-7AM, please contact night-coverage [email or web address] Password [email or web address] 01/02/2020, 1:10 PM

## 2020-01-02 NOTE — Progress Notes (Signed)
Pharmacy Antibiotic Note  [name] is a 86 y.o. male  With pseudomonal UTI and MSSA bacteremia.  He was on ancef PTA (end date 01/07/20) for bacteremia, pacemaker infection and  endocarditis  Pharmacy has been consulted for cefepime dosing. -WBC= 7.4, SCr- 1.34, CrCl ~ 40   Plan: -Cefepime 2gm IV q12h -Will follow renal function, cultures and clinical progress   Height: 5\' 8"  (172.7 cm) Weight: 183 lb 6.8 oz (83.2 kg) IBW/kg (Calculated) : 68.4  Temp (24hrs), Avg:97.7 F (36.5 C), Min:97.5 F (36.4 C), Max:98 F (36.7 C)  Recent Labs  Lab 12/28/19 1104 01/02/2020 1441 01/01/2020 2334  WBC  --  7.5 7.4  CREATININE 1.49* 1.37 1.34*  LATICACIDVEN  --   --  1.3    Estimated Creatinine Clearance: 43.1 mL/min (A) (by C-G formula based on SCr of 1.34 mg/dL (H)).    Allergies  Allergen Reactions  . Adhesive [Tape] Other (See Comments)    PATIENT'S SKIN TEARS AND BRUISES VERY EASILY- IS TAKING COUMADIN!!  . Aldactone [Spironolactone] Other (See Comments)    Hyperkalemia  reported by Dr. [name] - pt is currently taking 12.5 mg daily -November 2017 per medication list by same MD  . Losartan Other (See Comments)    "Landed the patient in the hospital"- AFFECTED KIDNEY FUNCTION    Antimicrobials this admission: Ancef (on PTA)>> 3/10 Cefepime 3/10  Dose adjustments this admission:   Microbiology results: 3/8 urine- pseudomonas - 100 K; sensitivities pending  Thank you for allowing pharmacy to be a part of this patient's care.  Hildred Laser, PharmD Clinical Pharmacist **Pharmacist phone directory can now be found on [email or web address] (PW [email or web address]).  Listed under [place].

## 2020-01-02 NOTE — Evaluation (Signed)
Physical Therapy Evaluation Patient Details Name: [name] MRN: [id number] DOB: 06/11/1934 Today's Date: 01/02/2020   History of Present Illness  86 year old with A. fib on Coumadin, ICM/systolic CHF (EF 99991111), CAD/CABG, DM-2, PAD, OSA, CVA, HTN, COPD, MSSA bacteremia on IV Ancef via PICC and hypothyroidism sent from pulmonary office to ED with concerning chest x-ray. He presented to his pulmonologist on 3/8 with worsening hypoxia, dyspnea, lower extremity edema and altered mental status.  CXR concerning for possible pulmonary edema/pneumonia.  He was directed to ED for further evaluation and treatment.  Clinical Impression   Pt admitted with above diagnosis. PTA lived home with spouse, pt denies having falls or needing assistance with ADLs and mobility but spouse in room and states pt have had multi falls and is needing increased assist with ADLs and mobility. Spouse reports even at home he is very impulsive. Spouse is around at home to help and son is close by but mostly spouse assists when needed. Pt currently with functional limitations due to the deficits listed below (see PT Problem List). Pt presented with decreased strength, balance and coordination, safety, independence, activity tolerance and safety with mobility. He was able to get to edge of bed but needed stern cues to curb impulsivity, pt needed mod a with sit<>stand and also ambulation, pt does poorly with walker propagation and also obstacle negotiation and turns. Even with cues and verbalization of understanding still very unsafe and impulsively pushes walker without awareness for obstacles or safety issues. With mod a to guide walker especially with turns and around obstacles. Pt on 3L/min via [place] and noted desat to high 70s with mobility and increased work of breathing. Once seated and cued able to recover to 92% within 1 min and pursed lip breathing.  Pt will benefit from skilled PT to increase his overall strength, balance and  coordination, activity tolerance, independence and safety with mobility to allow discharge to the venue listed below.       Follow Up Recommendations SNF;Supervision/Assistance - 24 hour    Equipment Recommendations  None recommended by PT    Recommendations for Other Services OT consult     Precautions / Restrictions Precautions Precautions: Fall Precaution Comments: highly impulsive Restrictions Weight Bearing Restrictions: No      Mobility  Bed Mobility Overal bed mobility: Needs Assistance Bed Mobility: Supine to Sit;Sit to Supine     Supine to sit: Min guard;Supervision;HOB elevated Sit to supine: Mod assist;Min assist(moving BLE back to bed)      Transfers Overall transfer level: Needs assistance Equipment used: Rolling walker (2 wheeled) Transfers: Sit to/from [facility] Sit to Stand: Mod assist;Min assist Stand pivot transfers: Mod assist;Min assist          Ambulation/Gait Ambulation/Gait assistance: Mod assist;+2 safety/equipment Gait Distance (Feet): 80 Feet Assistive device: Rolling walker (2 wheeled) Gait Pattern/deviations: Step-through pattern;Shuffle;Ataxic;Festinating;Staggering left;Staggering right;Drifts right/left;Trunk flexed;Wide base of support Gait velocity: festinating   General Gait Details: poor trunk control, poor control of walker especially with falls, festinates at times increasing speed with distance. Decreased safety with obstacles also and poor follow of instructions  Stairs            Wheelchair Mobility    Modified Rankin (Stroke Patients Only)       Balance Overall balance assessment: Needs assistance Sitting-balance support: Feet supported;Bilateral upper extremity supported Sitting balance-Leahy Scale: Fair     Standing balance support: During functional activity;Bilateral upper extremity supported Standing balance-Leahy Scale: Poor Standing balance comment: poor  trunk control needing UE  support and external support to maintain standing safely                             Pertinent Vitals/Pain Pain Assessment: No/denies pain    Home Living Family/patient expects to be discharged to:: Private residence Living Arrangements: Spouse/significant other Available Help at Discharge: Family;Available 24 hours/day Type of Home: House Home Access: Stairs to enter Entrance Stairs-Rails: Left Entrance Stairs-Number of Steps: 3 Home Layout: One level Home Equipment: Walker - 4 wheels;Cane - single point;Walker - 2 wheels      Prior Function Level of Independence: Needs assistance   Gait / Transfers Assistance Needed: (uses FWW but is needing increasing assist, is very impulsive)  ADL's / Homemaking Assistance Needed: wife and son assist with ADLs when needed        Hand Dominance   Dominant Hand: Right    Extremity/Trunk Assessment   Upper Extremity Assessment Upper Extremity Assessment: Defer to OT evaluation    Lower Extremity Assessment Lower Extremity Assessment: Generalized weakness    Cervical / Trunk Assessment Cervical / Trunk Assessment: Kyphotic;Lordotic  Communication   Communication: HOH;Expressive difficulties  Cognition Arousal/Alertness: Awake/alert Behavior During Therapy: Impulsive;Restless Overall Cognitive Status: Impaired/Different from baseline Area of Impairment: Attention;Memory;Following commands;Safety/judgement;Awareness;Problem solving                   Current Attention Level: Alternating Memory: Decreased short-term memory;Decreased recall of precautions Following Commands: Follows one step commands inconsistently;Follows one step commands with increased time Safety/Judgement: Decreased awareness of safety;Decreased awareness of deficits   Problem Solving: Slow processing;Decreased initiation;Difficulty sequencing;Requires verbal cues;Requires tactile cues General Comments: highly impulsive w/ HOH pt very  unsafe with mobility      General Comments General comments (skin integrity, edema, etc.): pt on 3L/min via [place] noted desat to min 77% with ambulation, also noted increased work of breathing. Pt educated on pursed lip breathing and was able to recover to 92% with <110min of sitting and cues    Exercises     Assessment/Plan    PT Assessment Patient needs continued PT services  PT Problem List Decreased strength;Decreased activity tolerance;Decreased balance;Decreased mobility;Decreased coordination;Decreased cognition;Decreased knowledge of use of DME;Decreased safety awareness;Decreased knowledge of precautions       PT Treatment Interventions Gait training;DME instruction;Stair training;Functional mobility training;Therapeutic activities;Therapeutic exercise;Balance training;Neuromuscular re-education;Cognitive remediation;Patient/family education    PT Goals (Current goals can be found in the Care Plan section)  Acute Rehab PT Goals Patient Stated Goal: did not voice goals, spouse talks of return to PLOF PT Goal Formulation: With patient/family Time For Goal Achievement: 01/16/20 Potential to Achieve Goals: Fair    Frequency Min 2X/week   Barriers to discharge Other (comment) lives home with spouse and may be too much for her to handle on her own    Co-evaluation               AM-PAC PT "6 Clicks" Mobility  Outcome Measure Help needed turning from your back to your side while in a flat bed without using bedrails?: A Little Help needed moving from lying on your back to sitting on the side of a flat bed without using bedrails?: A Little Help needed moving to and from a bed to a chair (including a wheelchair)?: A Lot Help needed standing up from a chair using your arms (e.g., wheelchair or bedside chair)?: A Lot Help needed to walk in hospital room?: A Lot  Help needed climbing 3-5 steps with a railing? : A Lot 6 Click Score: 14    End of Session Equipment Utilized During  Treatment: Oxygen;Gait belt Activity Tolerance: Patient limited by fatigue;Patient limited by lethargy;Treatment limited secondary to agitation Patient left: in bed;with call bell/phone within reach;with bed alarm set;with family/visitor present Nurse Communication: Mobility status PT Visit Diagnosis: Other abnormalities of gait and mobility (R26.89);History of falling (Z91.81);Muscle weakness (generalized) (M62.81);Unsteadiness on feet (R26.81);Ataxic gait (R26.0)    Time: [id number] PT Time Calculation (min) (ACUTE ONLY): 39 min   Charges:   PT Evaluation $PT Eval High Complexity: 1 High PT Treatments $Gait Training: 8-22 mins $Therapeutic Activity: 8-22 mins        Horald Chestnut, PT   Delford Field 01/02/2020, 1:26 PM

## 2020-01-02 NOTE — Progress Notes (Signed)
PICC single lumen

## 2020-01-02 NOTE — Progress Notes (Signed)
[name] for Warfarin Indication: atrial fibrillation  Allergies  Allergen Reactions  . Adhesive [Tape] Other (See Comments)    PATIENT'S SKIN TEARS AND BRUISES VERY EASILY- IS TAKING COUMADIN!!  . Aldactone [Spironolactone] Other (See Comments)    Hyperkalemia  reported by Dr. [name] - pt is currently taking 12.5 mg daily -November 2017 per medication list by same MD  . Losartan Other (See Comments)    "Landed the patient in the hospital"- AFFECTED KIDNEY FUNCTION    Patient Measurements: Height: 5\' 8"  (172.7 cm) Weight: 183 lb 6.8 oz (83.2 kg) IBW/kg (Calculated) : 68.4  Vital Signs: Temp: 97.5 F (36.4 C) (03/10 0451) Temp Source: Oral (03/10 0451) BP: 92/38 (03/10 0451) Pulse Rate: 72 (03/10 0451)  Labs: Recent Labs    01/10/2020 1441 01/13/2020 1441 01/01/2020 2248 12/30/2019 2248 01/14/2020 2334 01/01/20 1633 01/02/20 0853  HGB 11.0*   < > 12.2*   < > 11.9*  --  11.5*  HCT 33.2*   < > 36.0*  --  38.2*  --  37.4*  PLT 213.0  --   --   --  205  --  225  LABPROT  --   --   --   --  17.7*  --  16.3*  INR  --   --   --   --  1.5*  --  1.3*  CREATININE 1.37  --   --   --  1.34*  --  1.38*  TROPONINIHS  --   --   --   --  33* 28*  --    < > = values in this interval not displayed.    Estimated Creatinine Clearance: 41.9 mL/min (A) (by C-G formula based on SCr of 1.38 mg/dL (H)).   Medical History: Past Medical History:  Diagnosis Date  . Atrial fibrillation ([place])    persistent, previously seen at [facility] and placed on amiodarone  . Benign prostatic hypertrophy   . CAD (coronary artery disease)    multivessel s/p inferolateral wall MI with subsequent CABG 11/1998.  Cath 2009 with Patent grafts  . Cleft palate   . COPD with emphysema ([place]) 04/01/2010  . DM (diabetes mellitus), type 2 ([place])   . Dyspnea   . GERD (gastroesophageal reflux disease)   . HTN (hypertension)   . Hyperlipidemia   . Hypothyroidism   . Iron deficiency  anemia   . Ischemic dilated cardiomyopathy ([place])    EF 35-40% by MUGA 6/11  . MSSA bacteremia 11/27/2019  . Myocardial infarction ([place])   . Nasal septal deviation   . Nephrolithiasis   . OSA (obstructive sleep apnea)   . PAF (paroxysmal atrial fibrillation) ([place])   . Peripheral arterial disease (H[facility])    left subclavian artery stenosis  . PNA (pneumonia)   . Psoriasis   . Seborrheic keratosis   . Stroke ([employer])   . Systolic congestive heart failure ([place]) 2009   s/p BiV ICD implantation by Dr Leonia Reeves (MDT)    Assessment: 86 yr old male admitted on 01/23/2020 for acute on chronic hypoxic respiratory failure due to CHF and COPD exacerbation. He is on warfarin for afib (on PTA) and pharmacy consulted to dose. -INR= 1.3 (trend down)  Pt's home warfarin regimen is warfarin 2.5 mg PO daily (per pt, his dose had recently been reduced to this regimen due to a high INR of 3.5 at last clinic visit on 12/17/19).    Goal of Therapy:  INR 2-3 Monitor platelets by anticoagulation protocol: Yes   Plan:  Warfarin 5 mg PO X 1 this evening Monitor daily INR  Hildred Laser, PharmD Clinical Pharmacist **Pharmacist phone directory can now be found on [email or web address] ([email or web address]).  Listed under [place].
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
## 2020-01-02 NOTE — Progress Notes (Signed)
Inpatient Diabetes Program Recommendations  AACE/ADA: New Consensus Statement on Inpatient Glycemic Control   Target Ranges:  Prepandial:   less than 140 mg/dL      Peak postprandial:   less than 180 mg/dL (1-2 hours)      Critically ill patients:  140 - 180 mg/dL   Results for MAN, MOM (MRN [id number]) as of 01/02/2020 11:29  Ref. Range 01/01/2020 07:28 01/01/2020 12:16 01/01/2020 17:37 01/01/2020 20:53 01/02/2020 07:48  Glucose-Capillary Latest Ref Range: 70 - 99 mg/dL 74 177 (H) 208 (H) 271 (H) 113 (H)   Review of Glycemic Control  Diabetes history: DM2 Outpatient Diabetes medications: Xultophy 100-3.6 units-mg/ml 14 units QAM Current orders for Inpatient glycemic control: Novolog 0-9 units TID with meals, Novolog 0-5 units QHS; Prednisone 40 mg QAM  Inpatient Diabetes Program Recommendations:   Insulin-Meal Coverage: If steroids are continued, please consider ordering Novolog 2 units TID with meals for meal coverage if patient eats at least 50% of meals and if premeal glucose over 80 mg/dl.  Thanks, Barnie Alderman, RN, MSN, CDE Diabetes Coordinator Inpatient Diabetes Program [phone] (Team Pager from 8am to 5pm)

## 2020-01-03 ENCOUNTER — Inpatient Hospital Stay (HOSPITAL_COMMUNITY): Payer: HMO

## 2020-01-03 DIAGNOSIS — N183 Chronic kidney disease, stage 3 unspecified: Secondary | ICD-10-CM

## 2020-01-03 LAB — BASIC METABOLIC PANEL
Anion gap: 12 (ref 5–15)
BUN: 42 mg/dL — ABNORMAL HIGH (ref 8–23)
CO2: 31 mmol/L (ref 22–32)
Calcium: 8.9 mg/dL (ref 8.9–10.3)
Chloride: 95 mmol/L — ABNORMAL LOW (ref 98–111)
Creatinine, Ser: 1.74 mg/dL — ABNORMAL HIGH (ref 0.61–1.24)
GFR calc Af Amer: 41 mL/min — ABNORMAL LOW (ref >60)
GFR calc non Af Amer: 35 mL/min — ABNORMAL LOW (ref >60)
Glucose, Bld: 144 mg/dL — ABNORMAL HIGH (ref 70–99)
Potassium: 4.3 mmol/L (ref 3.5–5.1)
Sodium: 138 mmol/L (ref 135–145)

## 2020-01-03 LAB — GLUCOSE, CAPILLARY
Glucose-Capillary: 156 mg/dL — ABNORMAL HIGH (ref 70–99)
Glucose-Capillary: 226 mg/dL — ABNORMAL HIGH (ref 70–99)
Glucose-Capillary: 258 mg/dL — ABNORMAL HIGH (ref 70–99)
Glucose-Capillary: 285 mg/dL — ABNORMAL HIGH (ref 70–99)

## 2020-01-03 LAB — MAGNESIUM: Magnesium: 1.8 mg/dL (ref 1.7–2.4)

## 2020-01-03 LAB — PROTIME-INR
INR: 1.4 — ABNORMAL HIGH (ref 0.8–1.2)
Prothrombin Time: 17.1 seconds — ABNORMAL HIGH (ref 11.4–15.2)

## 2020-01-03 LAB — BRAIN NATRIURETIC PEPTIDE: B Natriuretic Peptide: 2558.2 pg/mL — ABNORMAL HIGH (ref 0.0–100.0)

## 2020-01-03 MED ORDER — ALBUMIN HUMAN 25 % IV SOLN
12.5000 g | Freq: Once | INTRAVENOUS | Status: AC
  Administered 2020-01-03: 05:00:00 12.5 g via INTRAVENOUS
  Filled 2020-01-03: qty 50

## 2020-01-03 MED ORDER — WARFARIN SODIUM 5 MG PO TABS
5.0000 mg | ORAL_TABLET | Freq: Once | ORAL | Status: AC
  Administered 2020-01-03: 18:00:00 5 mg via ORAL
  Filled 2020-01-03: qty 1
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
## 2020-01-03 NOTE — Progress Notes (Signed)
  Mobility Specialist Criteria Algorithm Info.  Mobility Team:  [facility] elevated:Self regulated Activity: Ambulated in hall;Dangled on edge of bed;Ambulated in room (Dangle EOB x 20 minutes (amb hall x1, room x1)) Range of motion: Active;All extremities Level of assistance: Minimal assist, patient does 75% or more (Min A (sit/stand) +2 w/chair follow while ambulating) Assistive device: Front wheel walker Minutes sitting in chair:  Minutes stood: 6 minutes Minutes ambulated: 6 minutes Distance ambulated (ft): 85 ft (61ft in hallway + 52ft in room) Mobility response: Tolerated fair;RN notified (very impulsive needing max cues for hand placement & walker mechanics) Bed Position: Semi-fowlers    01/05/2020 3:07 PM

## 2020-01-03 NOTE — Progress Notes (Signed)
This note also relates to the following rows which could not be included: Pulse Rate - Cannot attach notes to unvalidated device data Resp - Cannot attach notes to unvalidated device data SpO2 - Cannot attach notes to unvalidated device data    01/16/2020 0916  Vitals  ECG Heart Rate (!) 150  Ectopy Ventricular tachycardia, non-sustained (6 beat run. RN Otila Kluver notified)  MEWS Score  MEWS Temp 0  MEWS Systolic 1  MEWS Pulse 3  MEWS RR 2  MEWS LOC 0  MEWS Score 6  MEWS Score Color Red   [place] NP with cardiology notified and on unit/bedside.   Saddie Benders RN

## 2020-01-03 NOTE — Progress Notes (Addendum)
Progress Note  Patient Name: [name] Date of Encounter: 12/30/2019  Primary Cardiologist: Larae Grooms, MD   Subjective   Laying in bed this morning. Breathing is more labored. Blood pressures have been documented low throughout the evening.  Inpatient Medications    Scheduled Meds: . amiodarone  200 mg Oral Daily  . arformoterol  15 mcg Nebulization BID  . atorvastatin  80 mg Oral Daily  . budesonide (PULMICORT) nebulizer solution  0.25 mg Nebulization BID  . Chlorhexidine Gluconate Cloth  6 each Topical Daily  . feeding supplement (ENSURE ENLIVE)  237 mL Oral BID BM  . ferrous sulfate  325 mg Oral QPM  . guaiFENesin  600 mg Oral BID  . insulin aspart  0-15 Units Subcutaneous TID WC  . insulin aspart  0-5 Units Subcutaneous QHS  . insulin aspart  4 Units Subcutaneous TID WC  . ipratropium-albuterol  3 mL Nebulization TID  . levothyroxine  100 mcg Oral QAC breakfast  . predniSONE  40 mg Oral Q breakfast  . sodium chloride flush  3 mL Intravenous Q12H  . vitamin B-12  1,000 mcg Oral Daily  . warfarin  5 mg Oral ONCE-1800  . Warfarin - Pharmacist Dosing Inpatient   Does not apply q1800   Continuous Infusions: . sodium chloride 250 mL (01/02/20 1017)  . ceFEPime (MAXIPIME) IV 2 g (01/02/20 2213)   PRN Meds: sodium chloride, acetaminophen, albuterol, sodium chloride flush   Vital Signs    Vitals:   01/06/2020 0200 12/26/2019 0236 01/09/2020 0425 01/10/2020 0614  BP: (!) 79/61 (!) 82/48 (!) 79/56 (!) 84/59  Pulse:      Resp: (!) 24 16 (!) 28 (!) 26  Temp:   98.7 F (37.1 C)   TempSrc:   Axillary   SpO2:   100%   Weight:   83.7 kg   Height:        Intake/Output Summary (Last 24 hours) at 01/21/2020 0838 Last data filed at 01/22/2020 0726 Gross per 24 hour  Intake 360.15 ml  Output 950 ml  Net -589.85 ml   Last 3 Weights 01/07/2020 01/02/2020 01/02/2020  Weight (lbs) 184 lb 8.4 oz 183 lb 6.8 oz 184 lb  Weight (kg) 83.7 kg 83.2 kg 83.462 kg  Some  encounter information is confidential and restricted. Go to Review Flowsheets activity to see all data.      Telemetry    Vpaced - Personally Reviewed  ECG    Atrial sensed, Vpaced - Personally Reviewed  Physical Exam  Pleasant, confused older WM. Wearing [place].  GEN: Increased WOB this morning.  Neck: + JVD Cardiac: RRR, soft systolic murmur, rubs, or gallops.  Respiratory: Decreased BS at the bases.  GI: Soft, nontender, non-distended  MS: No edema; No deformity. Neuro:  Nonfocal  Psych: Normal affect, alert to self.  Labs    High Sensitivity Troponin:   Recent Labs  Lab 01/07/2020 2334 01/01/20 1633  TROPONINIHS 33* 28*      Chemistry Recent Labs  Lab 12/31/19 2334 01/02/20 0853 01/08/2020 0351  NA 137 138 138  K 4.4 3.9 4.3  CL 94* 95* 95*  CO2 34* 32 31  GLUCOSE 101* 230* 144*  BUN 21 27* 42*  CREATININE 1.34* 1.38* 1.74*  CALCIUM 8.7* 8.5* 8.9  PROT 6.6  --   --   ALBUMIN 2.4*  --   --   AST 17  --   --   ALT 5  --   --  ALKPHOS 98  --   --   BILITOT 0.6  --   --   GFRNONAA 48* 47* 35*  GFRAA 56* 54* 41*  ANIONGAP 9 11 12      Hematology Recent Labs  Lab 12/30/2019 1441 12/25/2019 1441 01/17/2020 2248 01/15/2020 2334 01/02/20 0853  WBC 7.5  --   --  7.4 10.2  RBC 3.58*  --   --  3.91* 3.83*  HGB 11.0*   < > 12.2* 11.9* 11.5*  HCT 33.2*   < > 36.0* 38.2* 37.4*  MCV 92.7  --   --  97.7 97.7  MCH  --   --   --  30.4 30.0  MCHC 33.1  --   --  31.2 30.7  RDW 16.3*  --   --  15.5 15.4  PLT 213.0  --   --  205 225   < > = values in this interval not displayed.    BNP Recent Labs  Lab 12/25/2019 1440 12/28/2019 2334 01/11/2020 0351  BNP  --  2,116.7* 2,558.2*  PROBNP 2,590.0*  --   --      DDimer No results for input(s): DDIMER in the last 168 hours.   Radiology    No results found.  Cardiac Studies   Echo: 11/27/19  IMPRESSIONS    1. Left ventricular ejection fraction, by visual estimation, is <20%. The  left ventricle has normal function.  There is no left ventricular  hypertrophy.  2. Severely dilated left ventricular internal cavity size.  3. The left ventricle demonstrates global hypokinesis.  4. Definity contrast agent was given IV to delineate the left ventricular  endocardial borders.  5. Elevated left atrial and left ventricular end-diastolic pressures.  6. Left ventricular diastolic parameters are consistent with Grade II  diastolic dysfunction (pseudonormalization).  7. Global right ventricle has normal systolic function.The right  ventricular size is normal. No increase in right ventricular wall  thickness.  8. Left atrial size was mildly dilated.  9. Right atrial size was normal.  10. Mild mitral annular calcification. Mild mitral valve regurgitation. No  evidence of mitral stenosis.  11. The tricuspid valve is normal in structure. Tricuspid valve  regurgitation is mild.  12. The aortic valve is tricuspid. Aortic valve regurgitation is not  visualized.  13. The pulmonic valve was normal in structure. Pulmonic valve  regurgitation is not visualized.  14. A pacer wire is visualized.   Patient Profile     86 y.o. male  with a hx of hypertension, hyperlipidemia, CAD status post CABG, hypothyroidism, former tobacco abuse, BPH, OSA, CVA, COPD, ICM with EF<20%, PAD with left subclavian stenosis, persistent A. fib on Coumadin, status post CRT-Pwho was seen for the evaluation of CHFat the request of Dr. Cyndia Skeeters.  Assessment & Plan    1. Acute on chronic heart failure/Acute on chronic Respiratory Failure: thought to be multifactorial in the setting of chronic heart failure, recurrent pleural effusions, and mild COPD exacerbation. Elevated BNP and CXR with bilateral pleural effusions. -- attempting to diuresis but little UOP is noted. His lasix was actually reduced back to 20mg  IV BID yesterday 2/2 to soft blood pressures. Manual BP reading was in the 120s yesterday. BB stopped yesterday. Dr. Claiborne Billings attempted to  add ARB but was held in the setting of hypotension. He is more dyspneic this morning, and little UOP noted overnight.  -- repeat CXR -- RN to check manual BP, will need diuresis if blood pressures allows. If remains soft will  need to consider inotrope therapy. Will review with MD regarding consult to AHF.   2. Bilateral Pleural effusions: CXR on 12/27/2019. Seen by pulmonary and no plans for thora at this time, hopefully for improvement with diuresis.    3. COPD exacerbation:Patient given prednisone daily and duo nebs plus Mucinex for cough -- pulmonology consulted and agree with treatment for CHF and COPD.   4. AMS/Delerium: Alert to self.CT of the head negative.   5. History ofMSSAbacteremia -Blood cultures drawn and pending and patient continued on Ancef per primary.  6. Persistent Afib: vpacing on telemetry. INR remains subtherapeutic. PharmD dosing.   7. DM: Hgb A1c 7.3 on SSI  8. AKI: Cr rising 1.34>>1.38>>1.74 today. ARB held yesterday prior to dose. Cardio-renal syndrome?  For questions or updates, please contact [place] Please consult [email or web address] for contact info under     Signed, Reino Bellis, NP  12/24/2019, 8:38 AM     Patient seen and examined. Agree with assessment and plan. Repeat cxr today to f/u pleura effusions. BP from leg 96/66. Cr increased to 1.74; I/O only  -676 since admission.  EF on TEE 2/4 25 - 30%. If significant residual effusion an radiograph continue diuresis. If BP further decreased ? Low dose inotropic therapy may be necessary.   Troy Sine, MD, [facility] 01/16/2020 10:17 AM

## 2020-01-03 NOTE — Significant Event (Addendum)
Rapid Response Event Note  Overview: Time Called: [id number] Arrival Time: 0950 Event Type: Hypotension Pt BP has been trending soft overnight. BP taken on pt's arm reading 58/49.  HR 76, RR 25  Initial Focused Assessment: Pt awake, alert, lying in bed. Pt is restless, trying to get out of bed. Per RN, pt has been confused. Expiratory wheeze heard in the left lung base. Pt does appear to have increased work of breathing, but partly could be related to restlessness. BP cuff placed on pt's leg, reading 96/66. Nail beds are dusky, extremities are warm. Pulses 1+. No edema.   Interventions: -Manual BP attempted and doppler used, unable to get adequate reading  -2 View CXR ordered  Plan of Care (if not transferred): Primary team recommends holding diuresing due to pt's soft BP. Cardiology expresses value of continuing to diurese. Consider invasive hemodynamic monitoring and vasopressors or inotropic agents if BP is unable to withstand diuresing and decision is made to continue diuresing. Considerations expressed to L. Mancel Bale, NP.   -Monitor for signs and symptoms of hemodynamic decline: decreased LOC, cold extremities, weakened pulses, monitor UOP, etc.  -Trend BP  -Continuous pulse oximetry monitoring  RN to call RR RN for further needs  Event Summary: Name of Physician Notified: Cardiology NP at 0950(NP at bedside when I arrived.) Outcome: Stayed in room and stabalized Event End Time: [place]

## 2020-01-03 NOTE — Progress Notes (Signed)
These results were shared with the patient at the office visit 01/15/2020. See note. The patient was transported to the hospital.

## 2020-01-03 NOTE — Progress Notes (Signed)
[facility] for Warfarin Indication: atrial fibrillation  Allergies  Allergen Reactions  . Adhesive [Tape] Other (See Comments)    PATIENT'S SKIN TEARS AND BRUISES VERY EASILY- IS TAKING COUMADIN!!  . Aldactone [Spironolactone] Other (See Comments)    Hyperkalemia  reported by Dr. [name] - pt is currently taking 12.5 mg daily -November 2017 per medication list by same MD  . Losartan Other (See Comments)    "Landed the patient in the hospital"- AFFECTED KIDNEY FUNCTION    Patient Measurements: Height: 5\' 8"  (172.7 cm) Weight: 184 lb 8.4 oz (83.7 kg) IBW/kg (Calculated) : 68.4  Vital Signs: Temp: 98.7 F (37.1 C) (03/11 0425) Temp Source: Axillary (03/11 0425) BP: 84/59 (03/11 0614) Pulse Rate: 68 (03/10 2156)  Labs: Recent Labs    01/15/2020 1441 01/06/2020 1441 01/02/2020 2248 01/07/2020 2248 01/16/2020 2334 01/01/20 1633 01/02/20 0853 01/18/2020 0351  HGB 11.0*   < > 12.2*   < > 11.9*  --  11.5*  --   HCT 33.2*   < > 36.0*  --  38.2*  --  37.4*  --   PLT 213.0  --   --   --  205  --  225  --   LABPROT  --   --   --   --  17.7*  --  16.3* 17.1*  INR  --   --   --   --  1.5*  --  1.3* 1.4*  CREATININE 1.37   < >  --   --  1.34*  --  1.38* 1.74*  TROPONINIHS  --   --   --   --  33* 28*  --   --    < > = values in this interval not displayed.    Estimated Creatinine Clearance: 33.3 mL/min (A) (by C-G formula based on SCr of 1.74 mg/dL (H)).   Medical History: Past Medical History:  Diagnosis Date  . Atrial fibrillation ([place])    persistent, previously seen at [facility] and placed on amiodarone  . Benign prostatic hypertrophy   . CAD (coronary artery disease)    multivessel s/p inferolateral wall MI with subsequent CABG 11/1998.  Cath 2009 with Patent grafts  . Cleft palate   . COPD with emphysema ([place]) 04/01/2010  . DM (diabetes mellitus), type 2 ([place])   . Dyspnea   . GERD (gastroesophageal reflux disease)   . HTN (hypertension)   .  Hyperlipidemia   . Hypothyroidism   . Iron deficiency anemia   . Ischemic dilated cardiomyopathy ([place])    EF 35-40% by MUGA 6/11  . MSSA bacteremia 11/27/2019  . Myocardial infarction ([place])   . Nasal septal deviation   . Nephrolithiasis   . OSA (obstructive sleep apnea)   . PAF (paroxysmal atrial fibrillation) ([place])   . Peripheral arterial disease ([facility])    left subclavian artery stenosis  . PNA (pneumonia)   . Psoriasis   . Seborrheic keratosis   . Stroke ([place])   . Systolic congestive heart failure ([place]) 2009   s/p BiV ICD implantation by Dr Leonia Reeves (MDT)    Assessment: 86 yr old male admitted on 01/12/2020 for acute on chronic hypoxic respiratory failure due to CHF and COPD exacerbation. He is on warfarin for afib (on PTA) and pharmacy consulted to dose. -INR= 1.4   Pt's home warfarin regimen is warfarin 2.5 mg PO daily (per pt, his dose had recently been reduced to this regimen due to a  high INR of 3.5 at last clinic visit on 12/17/19).    Goal of Therapy:  INR 2-3 Monitor platelets by anticoagulation protocol: Yes   Plan:  Warfarin 5 mg PO X 1 this evening Monitor daily INR  Hildred Laser, PharmD Clinical Pharmacist **Pharmacist phone directory can now be found on [email or web address] ([email or web address]).  Listed under [place].

## 2020-01-03 NOTE — Progress Notes (Signed)
   12/25/2019 0953  Vitals  BP 96/66  MAP (mmHg) 76  BP Location Left Leg  BP Method Automatic  Patient Position (if appropriate) Lying  ECG Heart Rate 79  Resp (!) 31  MEWS Score  MEWS Temp 0  MEWS Systolic 1  MEWS Pulse 0  MEWS RR 2  MEWS LOC 0  MEWS Score 3  MEWS Score Color Yellow   Patient vital with RR are reflected with patient confusion and continually trying to get out of bed with redirection from staff.    Saddie Benders RN

## 2020-01-03 NOTE — Progress Notes (Signed)
PROGRESS NOTE  [name] [id number] DOB: 1933-12-12   PCP: [name], MD  Patient is from: Home.  Ambulates with walker at baseline.  DOA: 01/11/2020 LOS: 2  Brief Narrative / Interim history: 86 year old with A. fib on Coumadin, ICM/systolic CHF (EF [id number]), CAD/CABG, DM-2, PAD, COPD, MSSA bacteremia on IV Ancef via PICC and hypothyroidism sent from pulmonary office to ED with concerning chest x-ray.  He presented to his pulmonologist on 3/8 with worsening hypoxia, dyspnea, lower extremity edema and altered mental status.  CXR concerning for possible pulmonary edema/pneumonia.  He was directed to ED for further evaluation and treatment.  In ED, HDS.  100% on 3 L. Cr 1.34 (about baseline).  BUN 21.  ABG 7.4 4/53/106/37.  Hgb 11.0 (above baseline).  BNP 2100 (higher than baseline).  LDL normal.  Troponin 33.  INR 1.5.  CT head negative for acute finding.  Influenza and COVID-19 PCR negative.  Urinalysis negative.  EKG with V paced rhythm but no acute ischemic finding.  CXR with bibasilar and RML opacity with associated bilateral nonspecific bronchitis and pleural effusion, pulmonary edema with cardiomegaly and possible Langerhans cell histocytosis in both apices.  Patient was admitted for acute on chronic hypoxic hypercarbic respiratory failure due to CHF and COPD exacerbation.  CT chest without contrast ordered.  Started on IV Lasix, systemic steroid and breathing treatments.  Pulmonology and cardiology consulted.   Blood cultures negative.  Urine culture with Pseudomonas aeruginosa with intermediate resistance to imipenem. Patient has no UTI symptoms but not a reliable historian due to confusion.  Antibiotics changed to IV cefepime on 3/10.   Subjective: Soft blood pressures this morning.  Renal function worse.  Patient has no complaints.  Fairly oriented (to self and place).  He denies chest pain, dyspnea or abdominal pain.  Objective: Vitals:   01/22/2020 0953 01/15/2020  1100 01/02/2020 1114 01/01/2020 1320  BP: 96/66  119/64 114/82  Pulse:   81 94  Resp: (!) 31 (!) 21 20 20   Temp:      TempSrc:      SpO2:   96% 100%  Weight:      Height:        Intake/Output Summary (Last 24 hours) at 01/01/2020 1404 Last data filed at 01/14/2020 1247 Gross per 24 hour  Intake 1180.15 ml  Output 1050 ml  Net 130.15 ml   Filed Weights   01/20/2020 2218 01/02/20 0451 01/08/2020 0425  Weight: 83.5 kg 83.2 kg 83.7 kg    Examination:  GENERAL: No apparent distress.  Nontoxic. HEENT: MMM.  Vision and hearing grossly intact.  NECK: Supple.  No apparent JVD.  RESP: On 3 L by [place].  No IWOB.  Fair aeration with rhonchi and crackles bilaterally CVS:  RRR. Heart sounds normal.  ABD/GI/GU: Bowel sounds present. Soft. Non tender.  MSK/EXT:  Moves extremities. No apparent deformity.  1+ pitting edema bilaterally SKIN: no apparent skin lesion or wound NEURO: Awake, alert and oriented self and place.  No apparent focal neuro deficit. PSYCH: Calm. Normal affect.  Procedures:  None  Assessment & Plan: Acute on chronic respiratory failure with hypoxia and hypercarbia: Likely a combination of CHF exacerbation, pleural effusion and COPD exacerbation.  ABG with mild hypercarbia.  CT chest without contrast with persistent moderate-sized bilateral pleural effusion, Rt > Lt, resolution of pulmonary edema seen on prior study, stable emphysematous changes and stable borderline mediastinal lymph node.  Doubt PE without tachycardia although INR is subtherapeutic.  Doubt pneumonia but  covered on cefepime now.  Influenza PCR and COVID-19 PCR negative. -Wean oxygen as able -Treat treatable causes as below.  Acute on chronic systolic CHF status post BiV ICD: TEE on 11/29/2019 with EF of 25 to 30%, global hypokinesis.  Has had dyspnea, edema, pleural effusions and cardiomegaly.  BNP higher than baseline.  Weight 10 pounds up from 12/04/2019 but different scale.  Iron O incomplete but 550 cc during the  day yesterday.  Creatinine up from 1.38-1.74 despite reducing his Lasix, although low-dose losartan was added.  BUN 27 > 42.  Bicarb 39> 31.  Soft blood pressures as well.  Repeat CXR with CHF. -Appreciate cardiology help. -I discontinued his Lasix and losartan this morning pending cardiology -Consulted advanced HF team-may need inotropes -GDMT-none at this time due to AKI and soft blood pressures -Monitor fluid status, renal function and electrolytes  COPD exacerbation: On Trelegy Ellipta and 3 L at baseline.  ABG with mild hypercarbia.  -Continue prednisone, Brovana, budesonide and DuoNeb -Wean oxygen as able -Appreciate pulmonology input  Bilateral moderate pleural effusion, Rt>Lt: Likely due to CHF -Appreciate pulmonology input-no thoracocentesis.  Diuretics for now  Acute metabolic encephalopathy: Unclear etiology.  Had mild hypercarbia but not significant enough to cause mental status change.  CT head, B12, TSH, RPR and ammonia within normal.  He is oriented to self and place but intermittently confused.  Urine culture grew Pseudomonas aeruginosa concerning for UTI.  Blood cultures negative. -Frequent reorientation, delirium and fall precautions. -Treat UTI  Pseudomonas aeruginosa UTI-POA. No UTI symptoms but not a reliable historian. History of MSSA bacteremia: to complete Ancef on 01/07/2020.  Blood cultures negative so far -Change Ancef today cefepime to cover for Pseudomonas aeruginosa UTI on 3/10 for 5 days.  Mild troponin elevation due to demand ischemia versus ACS.  History of CAD/CABG  -EKG with paced rhythm.  No acute ischemic finding.   No chest pain.  Atrial fibrillation on Coumadin with subtherapeutic INR: INR 1.5> 1.3> 1.4.   -Warfarin per pharmacy -Continue amiodarone-not ideal in the setting of COPD.  Insulin-dependent type 2 diabetes with hyperglycemia: Hemoglobin A1c 7.3%.  Hyperglycemia likely due to steroid Recent Labs    01/02/20 2152 01/16/2020 0749  01/05/2020 1124  GLUCAP 237* 156* 226*  -Continue SSI moderate -Increase NovoLog AC to 6 units -Continue statin  OSA: Continue CPAP at night.  Debility/physical deconditioning -PT/OT-recommended SNF.  Goal of care: Patient is DNR/DNI appropriately.  Significant cardiopulmonary comorbidities with AKI.  -Consult palliative care            DVT prophylaxis: On warfarin for A. fib Code Status: DNR/DNI Family Communication: Patient and/or RN.  Attempted to call patient's wife for update but no answer.  Discharge barrier: Acute on chronic respiratory failure, CHF exacerbation, COPD exacerbation, encephalopathy, AKI and Pseudomonas UTI.  Patient is from: Home Final disposition: To be determined based on clinical course.  PT recommended SNF  Consultants: Cardiology, PCCM and advance HF team   Microbiology summarized: COVID-19 negative Influenza PCR negative Blood cultures NGTD Urine culture Pseudomonas aeruginosa  Sch Meds:  Scheduled Meds: . amiodarone  200 mg Oral Daily  . arformoterol  15 mcg Nebulization BID  . atorvastatin  80 mg Oral Daily  . budesonide (PULMICORT) nebulizer solution  0.25 mg Nebulization BID  . Chlorhexidine Gluconate Cloth  6 each Topical Daily  . feeding supplement (ENSURE ENLIVE)  237 mL Oral BID BM  . ferrous sulfate  325 mg Oral QPM  . guaiFENesin  600 mg  Oral BID  . insulin aspart  0-15 Units Subcutaneous TID WC  . insulin aspart  0-5 Units Subcutaneous QHS  . insulin aspart  4 Units Subcutaneous TID WC  . ipratropium-albuterol  3 mL Nebulization TID  . levothyroxine  100 mcg Oral QAC breakfast  . predniSONE  40 mg Oral Q breakfast  . sodium chloride flush  3 mL Intravenous Q12H  . vitamin B-12  1,000 mcg Oral Daily  . warfarin  5 mg Oral ONCE-1800  . Warfarin - Pharmacist Dosing Inpatient   Does not apply q1800   Continuous Infusions: . sodium chloride 250 mL (01/02/20 1017)  . ceFEPime (MAXIPIME) IV 2 g (2020-01-28 1048)   PRN  Meds:.sodium chloride, acetaminophen, albuterol, sodium chloride flush  Antimicrobials: Anti-infectives (From admission, onward)   Start     Dose/Rate Route Frequency Ordered Stop   01/02/20 0930  ceFEPIme (MAXIPIME) 2 g in sodium chloride 0.9 % 100 mL IVPB     2 g 200 mL/hr over 30 Minutes Intravenous Every 12 hours 01/02/20 0827     01/01/20 0800  ceFAZolin (ANCEF) IVPB 2g/100 mL premix  Status:  Discontinued     2 g 200 mL/hr over 30 Minutes Intravenous Every 8 hours 01/01/20 0718 01/02/20 0756   01/01/20 0100  ceFAZolin (ANCEF) IVPB 2g/100 mL premix     2 g 200 mL/hr over 30 Minutes Intravenous  Once 01/01/20 0058 01/01/20 0255       I have personally reviewed the following labs and images: CBC: Recent Labs  Lab 01/12/2020 1441 01/23/2020 2248 01/19/2020 2334 01/02/20 0853  WBC 7.5  --  7.4 10.2  NEUTROABS 6.0  --  5.6  --   HGB 11.0* 12.2* 11.9* 11.5*  HCT 33.2* 36.0* 38.2* 37.4*  MCV 92.7  --  97.7 97.7  PLT 213.0  --  205 225   BMP &GFR Recent Labs  Lab 12/28/19 1104 12/28/19 1104 01/11/2020 1441 01/02/2020 2248 01/12/2020 2334 01/02/20 0853 01/28/2020 0351  NA 139  --  136 137 137 138 138  K 5.0   < > 4.1 4.3 4.4 3.9 4.3  CL 95*  --  95*  --  94* 95* 95*  CO2 32*  --  39*  --  34* 32 31  GLUCOSE 132*  --  143*  --  101* 230* 144*  BUN 22  --  23  --  21 27* 42*  CREATININE 1.49*  --  1.37  --  1.34* 1.38* 1.74*  CALCIUM 8.6  --  8.4  --  8.7* 8.5* 8.9  MG  --   --   --   --   --  1.8 1.8   < > = values in this interval not displayed.   Estimated Creatinine Clearance: 33.3 mL/min (A) (by C-G formula based on SCr of 1.74 mg/dL (H)). Liver & Pancreas: Recent Labs  Lab 01/07/2020 2334  AST 17  ALT 5  ALKPHOS 98  BILITOT 0.6  PROT 6.6  ALBUMIN 2.4*   No results for input(s): LIPASE, AMYLASE in the last 168 hours. Recent Labs  Lab 01/01/20 0711  AMMONIA 33   Diabetic: Recent Labs    01/01/20 1633  HGBA1C 7.3*   Recent Labs  Lab 01/02/20 1129  01/02/20 1612 01/02/20 2152 01-28-20 0749 28-Jan-2020 1124  GLUCAP 301* 242* 237* 156* 226*   Cardiac Enzymes: No results for input(s): CKTOTAL, CKMB, CKMBINDEX, TROPONINI in the last 168 hours. Recent Labs    05/09/19 1108  12/26/2019 1440  PROBNP 12,014* 2,590.0*   Coagulation Profile: Recent Labs  Lab 01/09/2020 2334 01/02/20 0853 01/16/2020 0351  INR 1.5* 1.3* 1.4*   Thyroid Function Tests: Recent Labs    01/01/20 0711  TSH 2.533   Lipid Profile: No results for input(s): CHOL, HDL, LDLCALC, TRIG, CHOLHDL, LDLDIRECT in the last 72 hours. Anemia Panel: Recent Labs    01/01/20 0711  VITAMINB12 1,383*   Urine analysis:    Component Value Date/Time   COLORURINE YELLOW 01/18/2020 2220   APPEARANCEUR CLEAR 01/21/2020 2220   LABSPEC 1.009 01/07/2020 2220   PHURINE 7.0 01/08/2020 2220   GLUCOSEU NEGATIVE 01/12/2020 2220   HGBUR NEGATIVE 12/27/2019 2220   BILIRUBINUR NEGATIVE 01/11/2020 2220   KETONESUR NEGATIVE 01/18/2020 2220   PROTEINUR NEGATIVE 12/30/2019 2220   UROBILINOGEN 0.2 11/28/2008 0325   NITRITE NEGATIVE 01/20/2020 2220   LEUKOCYTESUR NEGATIVE 01/09/2020 2220   Sepsis Labs: Invalid input(s): PROCALCITONIN, [place]  Microbiology: Recent Results (from the past 240 hour(s))  Blood culture (routine x 2)     Status: None (Preliminary result)   Collection Time: 01/09/2020 11:02 PM   Specimen: BLOOD  Result Value Ref Range Status   Specimen Description BLOOD SITE NOT SPECIFIED  Final   Special Requests   Final    BOTTLES DRAWN AEROBIC AND ANAEROBIC Blood Culture adequate volume   Culture   Final    NO GROWTH 3 DAYS Performed at [facility], [address] [place], [place] [address]    Report Status PENDING  Incomplete  Respiratory Panel by RT PCR (Flu A&B, Covid) - Nasopharyngeal Swab     Status: None   Collection Time: 01/20/2020 11:02 PM   Specimen: Nasopharyngeal Swab  Result Value Ref Range Status   SARS Coronavirus 2 by RT PCR NEGATIVE  NEGATIVE Final    Comment: (NOTE) SARS-CoV-2 target nucleic acids are NOT DETECTED. The SARS-CoV-2 RNA is generally detectable in upper respiratoy specimens during the acute phase of infection. The lowest concentration of SARS-CoV-2 viral copies this assay can detect is 131 copies/mL. A negative result does not preclude SARS-Cov-2 infection and should not be used as the sole basis for treatment or other patient management decisions. A negative result may occur with  improper specimen collection/handling, submission of specimen other than nasopharyngeal swab, presence of viral mutation(s) within the areas targeted by this assay, and inadequate number of viral copies (<131 copies/mL). A negative result must be combined with clinical observations, patient history, and epidemiological information. The expected result is Negative. Fact Sheet for Patients:  PinkCheek.be Fact Sheet for Healthcare Providers:  GravelBags.it This test is not yet ap proved or cleared by the [place] FDA and  has been authorized for detection and/or diagnosis of SARS-CoV-2 by FDA under an Emergency Use Authorization (EUA). This EUA will remain  in effect (meaning this test can be used) for the duration of the COVID-19 declaration under Section 564(b)(1) of the Act, 21 U.S.C. section 360bbb-3(b)(1), unless the authorization is terminated or revoked sooner.    Influenza A by PCR NEGATIVE NEGATIVE Final   Influenza B by PCR NEGATIVE NEGATIVE Final    Comment: (NOTE) The Xpert Xpress SARS-CoV-2/FLU/RSV assay is intended as an aid in  the diagnosis of influenza from Nasopharyngeal swab specimens and  should not be used as a sole basis for treatment. Nasal washings and  aspirates are unacceptable for Xpert Xpress SARS-CoV-2/FLU/RSV  testing. Fact Sheet for Patients: PinkCheek.be Fact Sheet for Healthcare  Providers: GravelBags.it This test  is not yet approved or cleared by the [place] and  has been authorized for detection and/or diagnosis of SARS-CoV-2 by  FDA under an Emergency Use Authorization (EUA). This EUA will remain  in effect (meaning this test can be used) for the duration of the  Covid-19 declaration under Section 564(b)(1) of the Act, 21  U.S.C. section 360bbb-3(b)(1), unless the authorization is  terminated or revoked. Performed at [facility], [place] [address], [place], [place] [address]   Urine culture     Status: Abnormal   Collection Time: 01/23/2020 11:20 PM   Specimen: Urine, Random  Result Value Ref Range Status   Specimen Description URINE, RANDOM  Final   Special Requests   Final    NONE Performed at [facility], [place] [address], [place], [place] [address]    Culture >=100,000 COLONIES/mL PSEUDOMONAS AERUGINOSA (A)  Final   Report Status 01/02/2020 FINAL  Final   Organism ID, Bacteria PSEUDOMONAS AERUGINOSA (A)  Final      Susceptibility   Pseudomonas aeruginosa - MIC*    CEFTAZIDIME 4 SENSITIVE Sensitive     CIPROFLOXACIN <=0.25 SENSITIVE Sensitive     GENTAMICIN <=1 SENSITIVE Sensitive     IMIPENEM 8 INTERMEDIATE Intermediate     PIP/TAZO 8 SENSITIVE Sensitive     CEFEPIME 2 SENSITIVE Sensitive     * >=100,000 COLONIES/mL PSEUDOMONAS AERUGINOSA  Blood culture (routine x 2)     Status: None (Preliminary result)   Collection Time: 01/01/20  7:11 AM   Specimen: BLOOD RIGHT ARM  Result Value Ref Range Status   Specimen Description BLOOD RIGHT ARM  Final   Special Requests   Final    BOTTLES DRAWN AEROBIC ONLY Blood Culture adequate volume   Culture   Final    NO GROWTH 2 DAYS Performed at [facility], [place] [address], [place], [place] [address]    Report Status PENDING  Incomplete    Radiology Studies: DG Chest 2 View  Result Date: 01/05/2020 CLINICAL DATA:  Short of breath today. History of  hypertension. Diabetes. CHF. Pneumonia. EXAM: CHEST - 2 VIEW COMPARISON:  12/30/2019 FINDINGS: The lateral view is degraded by motion and arm position. Both frontal views are degraded by patient size and positioning. Pacer/AICD device. Cervical spine fixation. Midline trachea. Moderate cardiomegaly. Right greater than left pleural effusions are similar. No pneumothorax. Diffuse interstitial prominence and indistinctness is similar, given differences in technique. Right greater than left basilar predominant airspace disease may be slightly improved. A right-sided PICC line is poorly visualized centrally. IMPRESSION: Congestive heart failure with right greater than left pleural effusions, similar. Slightly improved bibasilar aeration with airspace disease remaining. Atelectasis or infection. Electronically Signed   By: Abigail Miyamoto M.D.   On: 01/16/2020 11:43     Karsin Pesta T. Roscoe  If 7PM-7AM, please contact night-coverage [email or web address] Password [facility] 01/18/2020, 2:04 PM

## 2020-01-04 ENCOUNTER — Other Ambulatory Visit: Payer: Self-pay

## 2020-01-04 ENCOUNTER — Telehealth: Payer: Self-pay | Admitting: Interventional Cardiology

## 2020-01-04 ENCOUNTER — Ambulatory Visit: Payer: Self-pay

## 2020-01-05 LAB — CULTURE, BLOOD (ROUTINE X 2)
Culture: NO GROWTH
Special Requests: ADEQUATE

## 2020-01-06 LAB — CULTURE, BLOOD (ROUTINE X 2)
Culture: NO GROWTH
Special Requests: ADEQUATE

## 2020-01-07 ENCOUNTER — Ambulatory Visit: Payer: HMO | Admitting: Infectious Disease

## 2020-01-08 ENCOUNTER — Encounter: Payer: Self-pay | Admitting: Acute Care

## 2020-01-14 ENCOUNTER — Ambulatory Visit: Payer: HMO | Admitting: Interventional Cardiology

## 2020-01-16 ENCOUNTER — Telehealth: Payer: Self-pay | Admitting: Interventional Cardiology

## 2020-01-24 NOTE — Patient Outreach (Signed)
  [facility]) Care Management Chronic Special Needs Program    01-28-20  Name: [name], DOB: 1934-07-07  MRN: [id number]   [name] is enrolled in a chronic special needs plan for Heart Failure.  RNCM received notification regarding client status-Deceased.  Plan: RNCM will close case. Send case closure letter to primary care.  Thea Silversmith, RN, MSN, [place] [phone]

## 2020-01-24 NOTE — Death Summary Note (Signed)
DEATH SUMMARY   Patient Details  Name: [name] MRN: [id number] DOB: 09-14-1934  Admission/Discharge Information   Admit Date:  2020/01/05  Date of Death: Date of Death: January 08, 2020  Time of Death: Time of Death: 2144/01/21  Length of Stay: 3  Referring Physician: [name], MD   Reason(s) for Hospitalization    Diagnoses  Preliminary cause of death: Cardiac arrest [facility]) Secondary Diagnoses (including complications and co-morbidities):  Cardiac arrest: Reportedly became confused and agitated. Then unresponsive and pulseless prior to this.  Patient was DNR/DNI.  Passed away at 9:45 PM on 01/08/20.  DO NOT RESUSCITATE/DO NOT INTUBATE status  Acute on chronic respiratory failure with hypoxia and hypercarbia: Likely a combination of CHF exacerbation, pleural effusion and COPD exacerbation.  ABG with mild hypercarbia.  CT chest without contrast with persistent moderate-sized bilateral pleural effusion, Rt > Lt, resolution of pulmonary edema seen on prior study, stable emphysematous changes and stable borderline mediastinal lymph node.  Doubt PE without tachycardia although INR is subtherapeutic.  Doubt pneumonia but covered on cefepime now.  Influenza PCR and COVID-19 PCR negative.  Acute on chronic systolic CHF status post BiV ICD: TEE on 11/29/2019 with EF of 25 to 30%, global hypokinesis.  Has had dyspnea, edema, pleural effusions and cardiomegaly.  BNP higher than baseline.  Weight 10 pounds up from 12/04/2019 but different scale.  Iron O incomplete but 550 cc during the day yesterday.  Creatinine up from 1.38-1.74 despite reducing his Lasix, although low-dose losartan was added.  BUN 27 > 42.  Bicarb 39> 31.  Soft blood pressures as well.  Repeat CXR concerning for CHF exacerbation. -Held Lasix and losartan this morning given worsening renal function and hypotension until cardiology sees him -Discussed with advanced HF team, who is in communication with primary cardiology on  board -GDMT-none at this time due to AKI and soft blood pressures  COPD exacerbation: On Trelegy Ellipta and 3 L at baseline.  ABG with mild hypercarbia.  -Continue prednisone, Brovana, budesonide and DuoNeb -Pulmonology following.  Bilateral moderate pleural effusion, Rt>Lt: Likely due to CHF -Appreciate pulmonology input-recommended diuretics versus thoracocentesis  Acute metabolic encephalopathy with agitation: Likely delirium in the setting of acute illness.  Had mild hypercarbia but not significant enough to cause mental status change.  CT head, B12, TSH, RPR and ammonia within normal.  He is oriented to self and place but intermittently confused.  Urine culture grew Pseudomonas aeruginosa concerning for UTI.  Blood cultures negative. -One-to-one safety sitter vs physical restraint or medications given his comorbidities.  Pseudomonas aeruginosa UTI-POA. No UTI symptoms but not a reliable historian. History of MSSA bacteremia: to complete Ancef on 01/07/2020.  Blood cultures negative so far -Changed Ancef today cefepime to cover for Pseudomonas aeruginosa UTI on 3/10 for 5 days.  Mild troponin elevation due to demand ischemia versus ACS. History of CAD/CABG  -EKG with paced rhythm.  No acute ischemic finding.  No chest pain.  Atrial fibrillation on Coumadin with subtherapeutic INR:INR 1.5> 1.3> 1.4.  -Warfarin per pharmacy -On amiodarone.  Insulin-dependent type 2 diabetes with hyperglycemia: Hemoglobin A1c 7.3%.  Hyperglycemia likely due to steroid CBG (last 3)  Recent Labs    2020/01/08 1124 January 08, 2020 1639 2020-01-08 2124  GLUCAP 226* 258* 285*  -Continue SSI moderate -Increased NovoLog AC to 6 units -Continue statin  [id number] CPAP at night.  Debility/physical deconditioning -PT/OT-recommended SNF.   Brief Hospital Course (including significant findings, care, treatment, and services provided and events leading to death)  [name] Koyanagi  L Ramirez is a 86 y.o.  year old male with A. fib on Coumadin, ICM/systolic CHF (EF [id number]), CAD/CABG, DM-2, PAD, COPD, MSSA bacteremia on IV Ancef via PICC and hypothyroidism sent from pulmonary office to ED with concerning chest x-ray.  He presented to his pulmonologist on 3/8 with worsening hypoxia, dyspnea, lower extremity edema and altered mental status.  CXR concerning for possible pulmonary edema/pneumonia.  He was directed to ED for further evaluation and treatment.  In ED, HDS.  100% on 3 L. Cr 1.34 (about baseline).  BUN 21.  ABG 7.4 4/53/106/37.  Hgb 11.0 (above baseline).  BNP 2100 (higher than baseline).  LDL normal.  Troponin 33.  INR 1.5.  CT head negative for acute finding.  Influenza and COVID-19 PCR negative.  Urinalysis negative.  EKG with V paced rhythm but no acute ischemic finding.  CXR with bibasilar and RML opacity with associated bilateral nonspecific bronchitis and pleural effusion, pulmonary edema with cardiomegaly and possible Langerhans cell histocytosis in both apices.  Patient was admitted for acute on chronic hypoxic hypercarbic respiratory failure due to CHF and COPD exacerbation.  CT chest without contrast ordered.  Started on IV Lasix, systemic steroid and breathing treatments.  Pulmonology and cardiology consulted.   Blood cultures negative.  Urine culture with Pseudomonas aeruginosa with intermediate resistance to imipenem. Patient has no UTI symptoms but not a reliable historian due to confusion.  Antibiotics changed to IV cefepime on 3/10.  Patient went into cardiac arrest and passed away at 9:45 PM on 01/05/2020.  Attempted to reach patient's wife over the phone the next day to express my condolences but no answer.  Left voicemail.   Pertinent Labs and Studies  Significant Diagnostic Studies DG Chest 2 View  Result Date: Jan 05, 2020 CLINICAL DATA:  Short of breath today. History of hypertension. Diabetes. CHF. Pneumonia. EXAM: CHEST - 2 VIEW COMPARISON:  12/24/2019 FINDINGS:  The lateral view is degraded by motion and arm position. Both frontal views are degraded by patient size and positioning. Pacer/AICD device. Cervical spine fixation. Midline trachea. Moderate cardiomegaly. Right greater than left pleural effusions are similar. No pneumothorax. Diffuse interstitial prominence and indistinctness is similar, given differences in technique. Right greater than left basilar predominant airspace disease may be slightly improved. A right-sided PICC line is poorly visualized centrally. IMPRESSION: Congestive heart failure with right greater than left pleural effusions, similar. Slightly improved bibasilar aeration with airspace disease remaining. Atelectasis or infection. Electronically Signed   By: Abigail Miyamoto M.D.   On: 05-Jan-2020 11:43   DG Chest 2 View  Result Date: 01/12/2020 CLINICAL DATA:  Dyspnea. EXAM: CHEST - 2 VIEW COMPARISON:  December 03, 2019 FINDINGS: There is stable moderate cardiomegaly. A right PICC catheter tip is last clearly imaged projecting on the distal superior vena cava. Triple lead cardiac pacemaker with left-sided battery pack, CABG markers, intact median sternotomy wires and cervicothoracic junction ACDF are redemonstrated. Bibasilar and right middle lobe consolidation and bilateral pleural effusion are moderately sized on the right and small on the left. There is pulmonary edema as well as bilateral bronchitis. Vertebra plana deformity of L1 is chronic, similar to the November 21, 2019 CT imaging. Bilateral pulmonary cystic disease and apical nodular pleural thickening are redemonstrated. There are healed right rib fracture deformities, aortic calcified atherosclerosis, skeletal degenerative changes, and diffuse bone demineralization. IMPRESSION: 1. Bibasilar and right middle lobe consolidation, with associated bilateral nonspecific bronchitis and pleural effusions, potentially representing pneumonia and/or compressive atelectasis. 2. Pulmonary edema, with  cardiomegaly,  coronary artery disease, and remote thoracic postoperative changes. 3. Chronic pulmonary cystic disease and bilateral nodular apical pleural thickening. Question an underlying Langerhans cell histiocytosis. 4. Chronic L1 vertebra plana deformity, possibly posttraumatic given the healed right rib fractures. Electronically Signed   By: Revonda Humphrey   On: 01/13/2020 15:53   CT Head Wo Contrast  Result Date: 01/01/2020 CLINICAL DATA:  Encephalopathy. EXAM: CT HEAD WITHOUT CONTRAST TECHNIQUE: Contiguous axial images were obtained from the base of the skull through the vertex without intravenous contrast. COMPARISON:  November 21, 2019 FINDINGS: Brain: No evidence of acute infarction, hemorrhage, hydrocephalus, extra-axial collection or mass lesion/mass effect. Atrophy is again noted. Vascular: No hyperdense vessel or unexpected calcification. Skull: Normal. Negative for fracture or focal lesion. Sinuses/Orbits: There is a mucosal retention cyst within the right maxillary sinus. There is mild opacification of the left mastoid air cells. Other: There is an old fracture of the dens. No acute displaced fracture identified on this study. IMPRESSION: 1. No acute intracranial abnormality. 2. Atrophy is again noted. Electronically Signed   By: Constance Holster M.D.   On: 01/01/2020 00:19   CT CHEST WO CONTRAST  Result Date: 01/01/2020 CLINICAL DATA:  Progressive shortness of breath. EXAM: CT CHEST WITHOUT CONTRAST TECHNIQUE: Multidetector CT imaging of the chest was performed following the standard protocol without IV contrast. COMPARISON:  11/21/2019 FINDINGS: Cardiovascular: The heart is enlarged but appears stable. No pericardial effusion. The pacer wires appears stable. Stable tortuosity and calcification of the thoracic aorta. Stable densely calcified origin of the left subclavian artery with probable complete occlusion. Stable advanced three-vessel coronary artery calcifications and evidence of prior  coronary artery bypass surgery. Mediastinum/Nodes: Stable scattered borderline enlarged mediastinal lymph nodes. These are likely inflammatory/reactive. The esophagus is grossly normal. Lungs/Pleura: Persistent moderate-sized bilateral pleural effusions, right larger than left with significant overlying atelectasis. Near complete right lower lobe atelectasis. Stable underlying emphysematous changes and pulmonary scarring but resolution of pulmonary edema seen on the prior study. No worrisome pulmonary lesions. Upper Abdomen: Stable advanced atherosclerotic calcifications involving the upper abdominal aorta and branch vessels. Stable cholelithiasis. Musculoskeletal: No chest wall mass, supraclavicular or axillary adenopathy. Stable healed/healing bilateral rib fractures. No acute bony findings. Stable L1 and L2 fractures. IMPRESSION: 1. Persistent moderate-sized bilateral pleural effusions, right larger than left with significant overlying atelectasis. 2. Resolution of pulmonary edema seen on the prior study. 3. Stable emphysematous changes and pulmonary scarring. 4. Stable borderline enlarged mediastinal lymph nodes, likely inflammatory/reactive. 5. Stable advanced atherosclerotic calcifications involving the thoracic and abdominal aorta and branch vessels. Aortic Atherosclerosis (ICD10-I70.0) and Emphysema (ICD10-J43.9). Aortic Atherosclerosis (ICD10-I70.0) and Emphysema (ICD10-J43.9). Electronically Signed   By: Marijo Sanes M.D.   On: 01/01/2020 08:08   CUP PACEART REMOTE DEVICE CHECK  Result Date: 12/11/2019 Scheduled remote transmission reviewed. CRT pacing 100% of the time. HF risk factors: Optivol (sudden rise late January), Pt activity, Night vent rate, Heart rate variability. Will forward for review. Normal device function. Plan: remote in 91 days.   Microbiology Recent Results (from the past 240 hour(s))  Blood culture (routine x 2)     Status: None (Preliminary result)   Collection Time:  01/18/2020 11:02 PM   Specimen: BLOOD  Result Value Ref Range Status   Specimen Description BLOOD SITE NOT SPECIFIED  Final   Special Requests   Final    BOTTLES DRAWN AEROBIC AND ANAEROBIC Blood Culture adequate volume   Culture   Final    NO GROWTH 4 DAYS Performed at [facility], [place] [address], [place], [place] [address]    Report Status PENDING  Incomplete  Respiratory Panel by RT PCR (Flu A&B, Covid) - Nasopharyngeal Swab     Status: None   Collection Time: 01/06/2020 11:02 PM   Specimen: Nasopharyngeal Swab  Result Value Ref Range Status   SARS Coronavirus 2 by RT PCR NEGATIVE NEGATIVE Final    Comment: (NOTE) SARS-CoV-2 target nucleic acids are NOT DETECTED. The SARS-CoV-2 RNA is generally detectable in upper respiratoy specimens during the acute phase of infection. The lowest concentration of SARS-CoV-2 viral copies this assay can detect is 131 copies/mL. A negative result does not preclude SARS-Cov-2 infection and should not be used as the sole basis for treatment or other patient management decisions. A negative result may occur with  improper specimen collection/handling, submission of specimen other than nasopharyngeal swab, presence of viral mutation(s) within the areas targeted by this assay, and inadequate number of viral copies (<131 copies/mL). A negative result must be combined with clinical observations, patient history, and epidemiological information. The expected result is Negative. Fact Sheet for Patients:  PinkCheek.be Fact Sheet for Healthcare Providers:  GravelBags.it This test is not yet ap proved or cleared by the [place] FDA and  has been authorized for detection and/or diagnosis of SARS-CoV-2 by FDA under an Emergency Use Authorization (EUA). This EUA will remain  in effect (meaning this test can be used) for the duration of the COVID-19 declaration under Section 564(b)(1) of the  Act, 21 U.S.C. section 360bbb-3(b)(1), unless the authorization is terminated or revoked sooner.    Influenza A by PCR NEGATIVE NEGATIVE Final   Influenza B by PCR NEGATIVE NEGATIVE Final    Comment: (NOTE) The Xpert Xpress SARS-CoV-2/FLU/RSV assay is intended as an aid in  the diagnosis of influenza from Nasopharyngeal swab specimens and  should not be used as a sole basis for treatment. Nasal washings and  aspirates are unacceptable for Xpert Xpress SARS-CoV-2/FLU/RSV  testing. Fact Sheet for Patients: PinkCheek.be Fact Sheet for Healthcare Providers: GravelBags.it This test is not yet approved or cleared by the [place] FDA and  has been authorized for detection and/or diagnosis of SARS-CoV-2 by  FDA under an Emergency Use Authorization (EUA). This EUA will remain  in effect (meaning this test can be used) for the duration of the  Covid-19 declaration under Section 564(b)(1) of the Act, 21  U.S.C. section 360bbb-3(b)(1), unless the authorization is  terminated or revoked. Performed at [facility], [place] [address], [place], [place] [address]   Urine culture     Status: Abnormal   Collection Time: 01/16/2020 11:20 PM   Specimen: Urine, Random  Result Value Ref Range Status   Specimen Description URINE, RANDOM  Final   Special Requests   Final    NONE Performed at [facility], [place] [address], [place], [place] [address]    Culture >=100,000 COLONIES/mL PSEUDOMONAS AERUGINOSA (A)  Final   Report Status 01/02/2020 FINAL  Final   Organism ID, Bacteria PSEUDOMONAS AERUGINOSA (A)  Final      Susceptibility   Pseudomonas aeruginosa - MIC*    CEFTAZIDIME 4 SENSITIVE Sensitive     CIPROFLOXACIN <=0.25 SENSITIVE Sensitive     GENTAMICIN <=1 SENSITIVE Sensitive     IMIPENEM 8 INTERMEDIATE Intermediate     PIP/TAZO 8 SENSITIVE Sensitive     CEFEPIME 2 SENSITIVE Sensitive     * >=100,000 COLONIES/mL PSEUDOMONAS  AERUGINOSA  Blood culture (routine x  2)     Status: None (Preliminary result)   Collection Time: 01/01/20  7:11 AM   Specimen: BLOOD RIGHT ARM  Result Value Ref Range Status   Specimen Description BLOOD RIGHT ARM  Final   Special Requests   Final    BOTTLES DRAWN AEROBIC ONLY Blood Culture adequate volume   Culture   Final    NO GROWTH 3 DAYS Performed at [facility], [address] [place], [place] [address]    Report Status PENDING  Incomplete    Lab Basic Metabolic Panel: Recent Labs  Lab 01/15/2020 1441 01/16/2020 2248 12/27/2019 2334 01/02/20 0853 2020/01/25 0351  NA 136 137 137 138 138  K 4.1 4.3 4.4 3.9 4.3  CL 95*  --  94* 95* 95*  CO2 39*  --  34* 32 31  GLUCOSE 143*  --  101* 230* 144*  BUN 23  --  21 27* 42*  CREATININE 1.37  --  1.34* 1.38* 1.74*  CALCIUM 8.4  --  8.7* 8.5* 8.9  MG  --   --   --  1.8 1.8   Liver Function Tests: Recent Labs  Lab 01/20/2020 2334  AST 17  ALT 5  ALKPHOS 98  BILITOT 0.6  PROT 6.6  ALBUMIN 2.4*   No results for input(s): LIPASE, AMYLASE in the last 168 hours. Recent Labs  Lab 01/01/20 0711  AMMONIA 33   CBC: Recent Labs  Lab 01/16/2020 1441 01/06/2020 2248 01/22/2020 2334 01/02/20 0853  WBC 7.5  --  7.4 10.2  NEUTROABS 6.0  --  5.6  --   HGB 11.0* 12.2* 11.9* 11.5*  HCT 33.2* 36.0* 38.2* 37.4*  MCV 92.7  --  97.7 97.7  PLT 213.0  --  205 225   Cardiac Enzymes: No results for input(s): CKTOTAL, CKMB, CKMBINDEX, TROPONINI in the last 168 hours. Sepsis Labs: Recent Labs  Lab 01/20/2020 1441 01/11/2020 2334 01/01/20 1633 01/02/20 0853  PROCALCITON  --   --  0.12  --   WBC 7.5 7.4  --  10.2  LATICACIDVEN  --  1.3  --   --     Procedures/Operations  None   Lummie Montijo T Ival Pacer 01/19/2020, 4:46 PM

## 2020-01-24 NOTE — Telephone Encounter (Signed)
Called patient's wife and gave our condolences. Will send a sympathy card.

## 2020-01-24 NOTE — Progress Notes (Signed)
Patient confused, verbal, and impulsive with bedside  Safety Sitter implemented at the beginning of shifti. Requested to speak to his brother and was put on the phone when Sister-in-law answered the call. Brother called back while receiving breathing treatment with respiratory Therapy present. Received evening medications without issues. Physically aggressive  and  adamant at staying in bed. Repeatedly asking "have you seen my mother?" and reported going to see "Grandmother across the hall" to the [place] Then scoot down in  the bed, head down, and went unresponsive and pulseless. Code Blue called by [employer] and cancelled, given patient Code Status. Pronounced at 02/05/2144 by two Nurses. On Call Provider Jeannette Corpus notified at 02-04-49. Spouse [name] phoned and made aware at [phone]. Family came in with [facility] information and provided emotional support. Body transported to [facility], to be release to [facility]. Leveda Anna, BSN, RN

## 2020-01-24 NOTE — Telephone Encounter (Signed)
Patients wife calling to advise [name] and Dr. Rayann Heman that Mr. Radde passed away last night.

## 2020-01-24 NOTE — Telephone Encounter (Signed)
I will forward note to Dr. Rayann Heman and [name] as well as their nurses' as Juluis Rainier

## 2020-01-24 DEATH — deceased

## 2020-08-08 IMAGING — DX DG CHEST 1V PORT
1 series · 2 of 2 positions shown · non-contrast
Comparison: 09/20/2018

CLINICAL DATA: Severe shortness of breath

EXAM:
PORTABLE CHEST 1 VIEW

[Series 1: chest · 0.14mm/px · 2 of 2 slices shown]
[im 1/2]
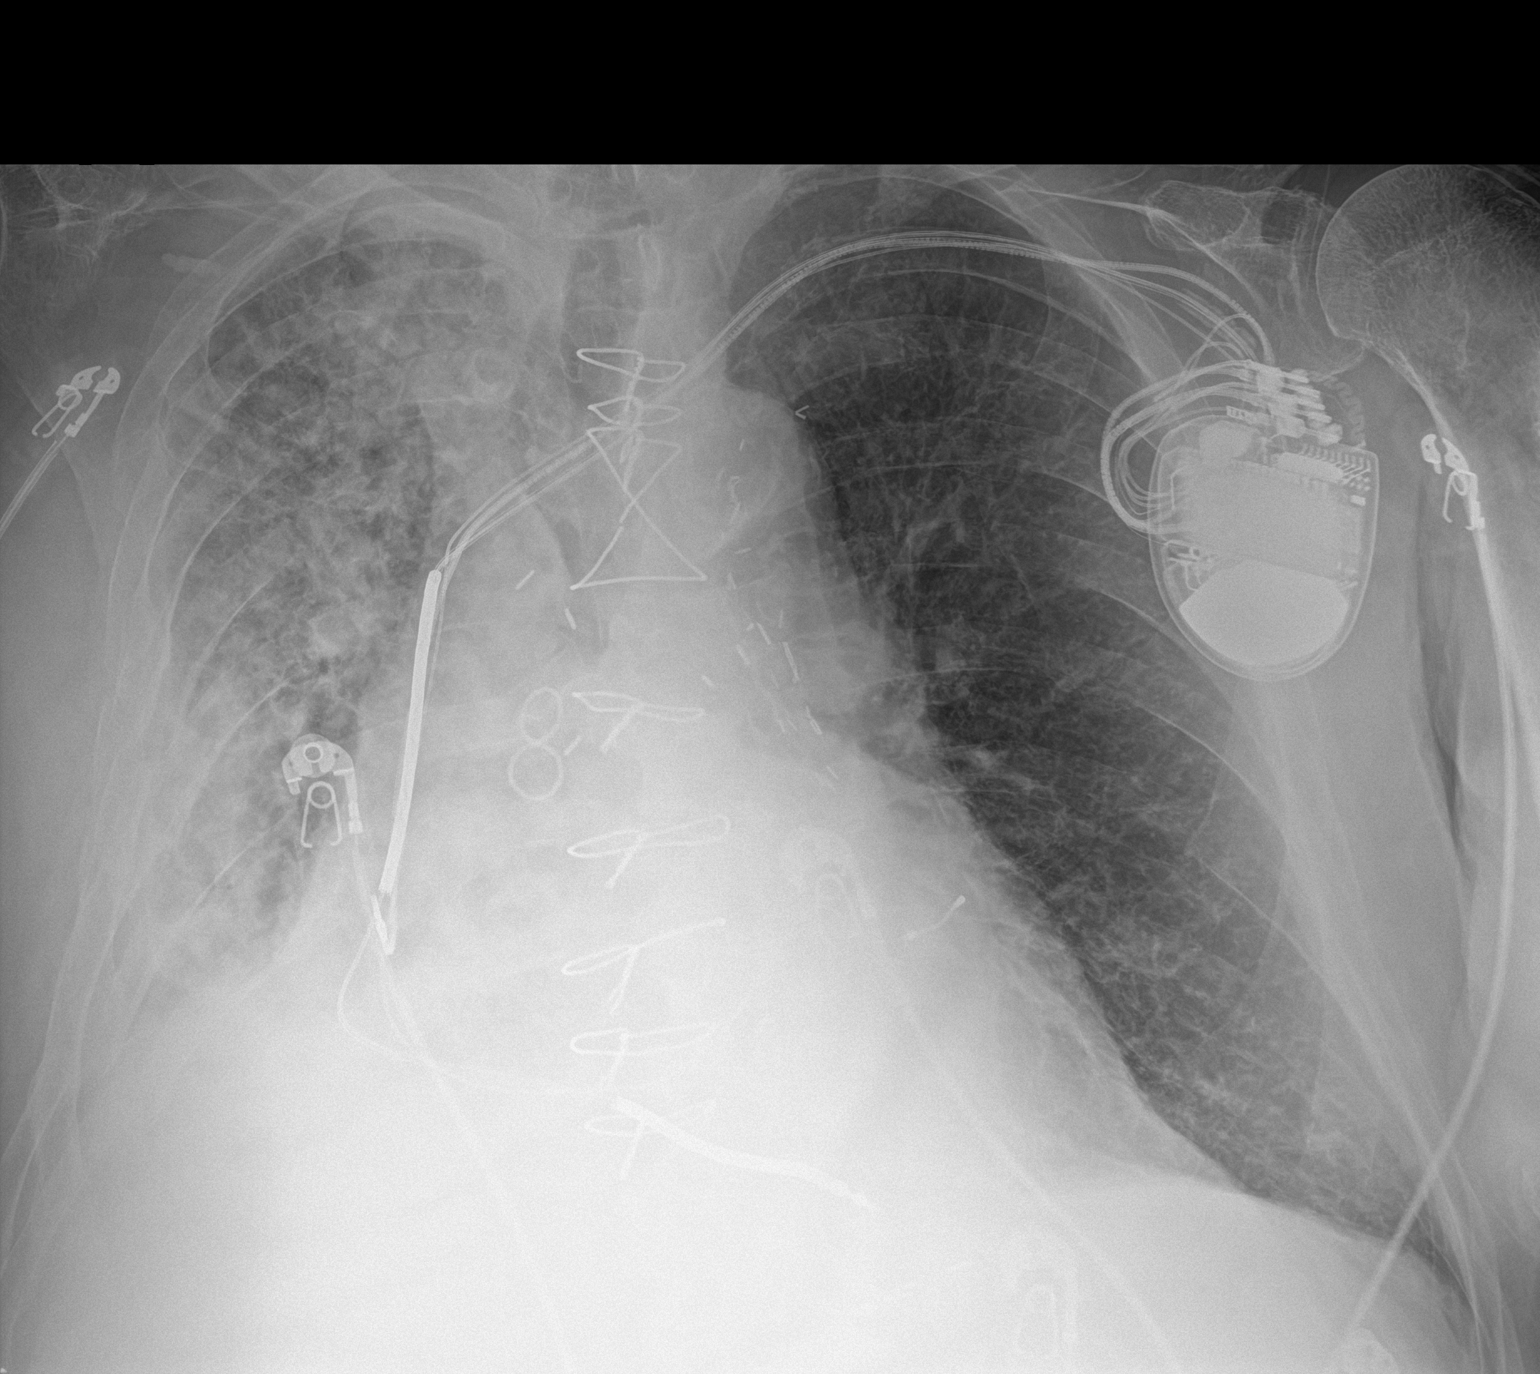
[im 2/2]
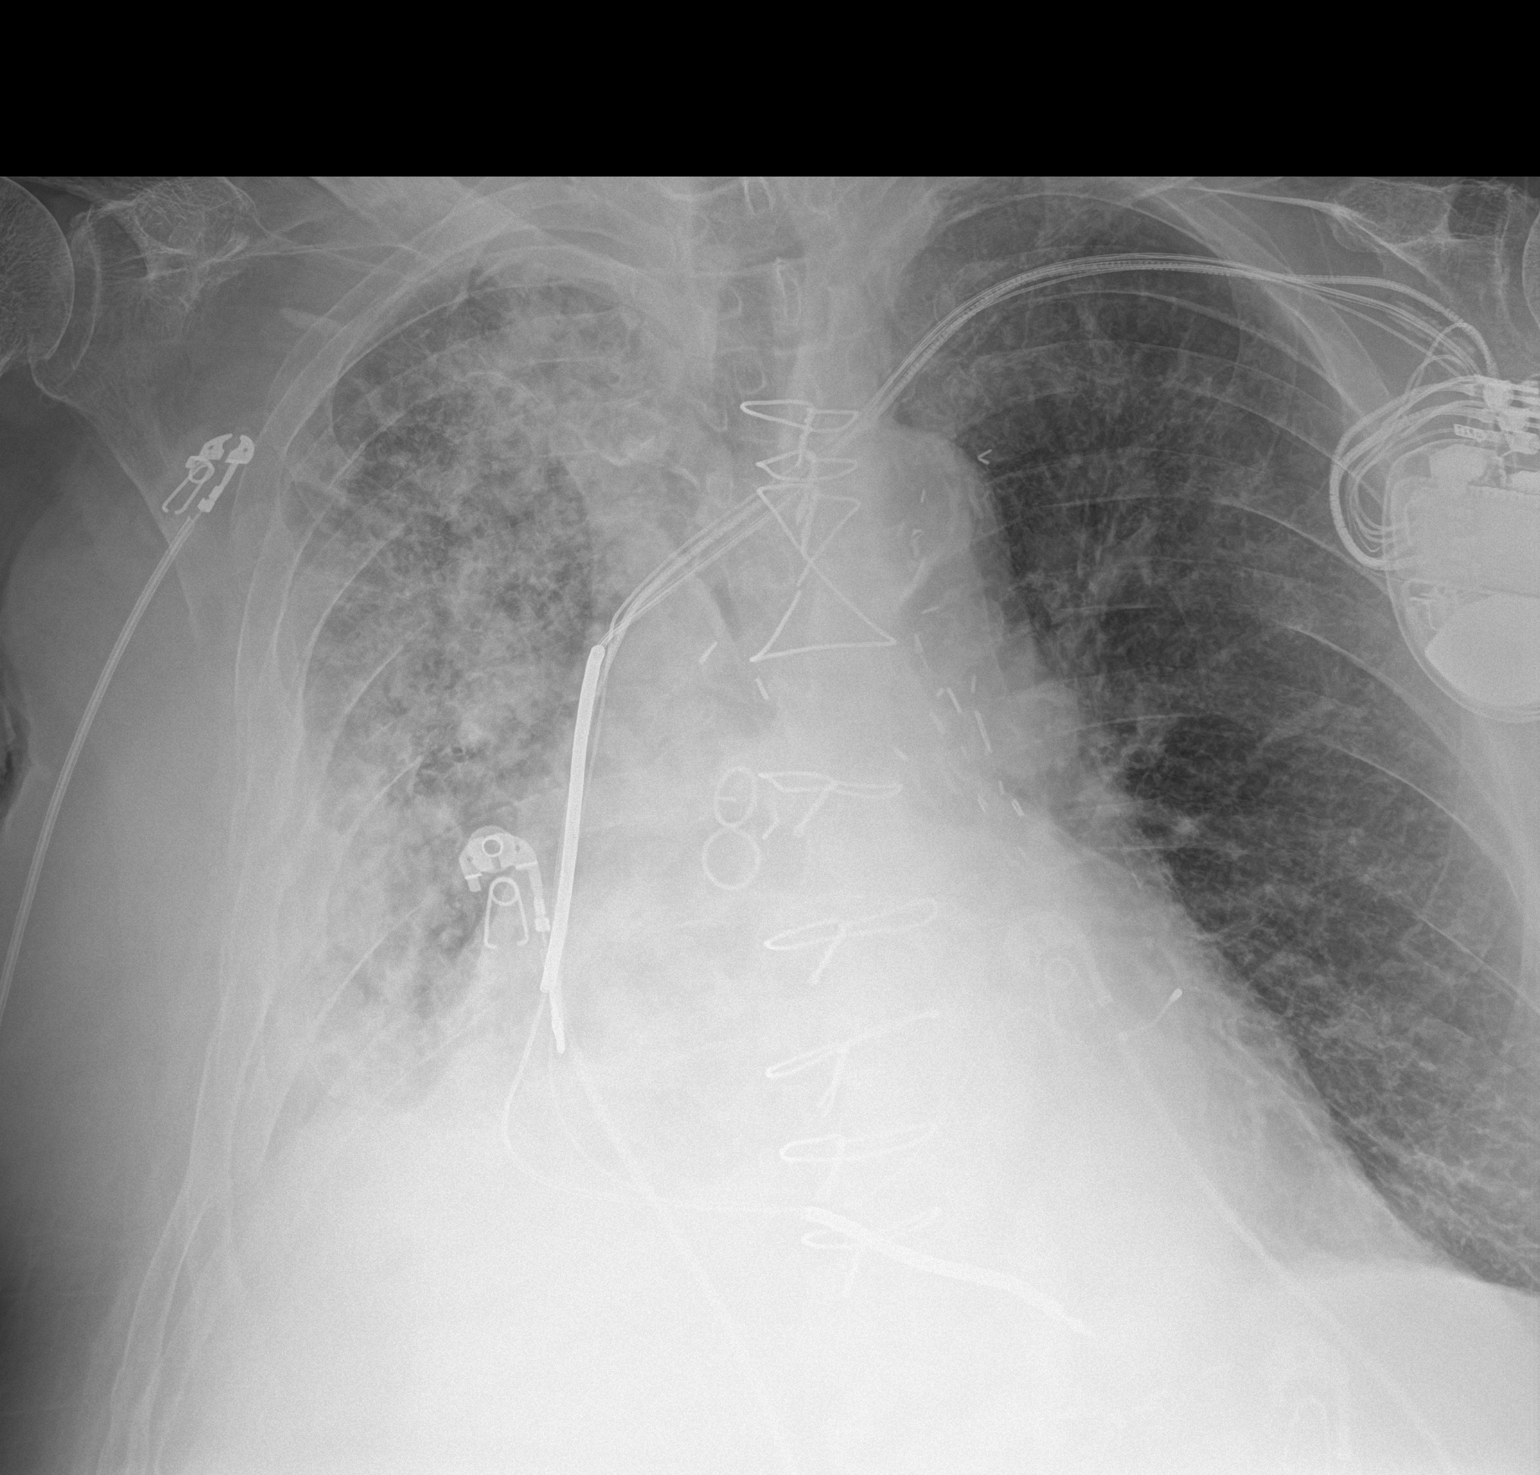

[2 of 2 positions shown; findings below may reference images not displayed]

FINDINGS: Extensive airspace disease asymmetric to the right. Chronic volume
loss on the right pleural thickening. There is a background of COPD
with left lung hyperinflation. Biventricular ICD/pacer leads from
the left in stable position. Stable cardiomegaly. Status post CABG
IMPRESSION: 1. Extensive airspace disease on the right consistent with pneumonia
in the appropriate clinical setting.
2. COPD.
